# Patient Record
Sex: Female | Born: 1950 | Race: White | Hispanic: No | Marital: Single | Smoking: Former smoker
Health system: Southern US, Community
[De-identification: ages and names within clinical notes are randomized; demographics above are authoritative.]

## PROBLEM LIST (undated history)

## (undated) DIAGNOSIS — D649 Anemia, unspecified: Secondary | ICD-10-CM

## (undated) DIAGNOSIS — R7303 Prediabetes: Secondary | ICD-10-CM

## (undated) DIAGNOSIS — K5720 Diverticulitis of large intestine with perforation and abscess without bleeding: Secondary | ICD-10-CM

## (undated) DIAGNOSIS — R55 Syncope and collapse: Secondary | ICD-10-CM

## (undated) DIAGNOSIS — F32A Depression, unspecified: Secondary | ICD-10-CM

## (undated) DIAGNOSIS — J45909 Unspecified asthma, uncomplicated: Secondary | ICD-10-CM

## (undated) DIAGNOSIS — R0600 Dyspnea, unspecified: Secondary | ICD-10-CM

## (undated) DIAGNOSIS — E079 Disorder of thyroid, unspecified: Secondary | ICD-10-CM

## (undated) DIAGNOSIS — E785 Hyperlipidemia, unspecified: Secondary | ICD-10-CM

## (undated) DIAGNOSIS — R918 Other nonspecific abnormal finding of lung field: Secondary | ICD-10-CM

## (undated) DIAGNOSIS — R519 Headache, unspecified: Secondary | ICD-10-CM

## (undated) DIAGNOSIS — T7840XA Allergy, unspecified, initial encounter: Secondary | ICD-10-CM

## (undated) DIAGNOSIS — K589 Irritable bowel syndrome without diarrhea: Secondary | ICD-10-CM

## (undated) DIAGNOSIS — F419 Anxiety disorder, unspecified: Secondary | ICD-10-CM

## (undated) DIAGNOSIS — H269 Unspecified cataract: Secondary | ICD-10-CM

## (undated) DIAGNOSIS — J189 Pneumonia, unspecified organism: Secondary | ICD-10-CM

## (undated) DIAGNOSIS — K5790 Diverticulosis of intestine, part unspecified, without perforation or abscess without bleeding: Secondary | ICD-10-CM

## (undated) DIAGNOSIS — E039 Hypothyroidism, unspecified: Secondary | ICD-10-CM

## (undated) DIAGNOSIS — G43101 Migraine with aura, not intractable, with status migrainosus: Secondary | ICD-10-CM

## (undated) DIAGNOSIS — F329 Major depressive disorder, single episode, unspecified: Secondary | ICD-10-CM

## (undated) DIAGNOSIS — K5792 Diverticulitis of intestine, part unspecified, without perforation or abscess without bleeding: Secondary | ICD-10-CM

## (undated) DIAGNOSIS — K219 Gastro-esophageal reflux disease without esophagitis: Secondary | ICD-10-CM

## (undated) DIAGNOSIS — R51 Headache: Secondary | ICD-10-CM

## (undated) HISTORY — DX: Unspecified cataract: H26.9

## (undated) HISTORY — PX: HEMORRHOID SURGERY: SHX153

## (undated) HISTORY — DX: Irritable bowel syndrome, unspecified: K58.9

## (undated) HISTORY — DX: Disorder of thyroid, unspecified: E07.9

## (undated) HISTORY — DX: Depression, unspecified: F32.A

## (undated) HISTORY — DX: Major depressive disorder, single episode, unspecified: F32.9

## (undated) HISTORY — DX: Allergy, unspecified, initial encounter: T78.40XA

## (undated) HISTORY — PX: TUBAL LIGATION: SHX77

## (undated) HISTORY — DX: Gastro-esophageal reflux disease without esophagitis: K21.9

## (undated) HISTORY — DX: Unspecified asthma, uncomplicated: J45.909

## (undated) HISTORY — PX: COLONOSCOPY: SHX174

## (undated) HISTORY — DX: Anxiety disorder, unspecified: F41.9

## (undated) HISTORY — DX: Diverticulosis of intestine, part unspecified, without perforation or abscess without bleeding: K57.90

## (undated) HISTORY — PX: TUMOR REMOVAL: SHX12

## (undated) HISTORY — DX: Anemia, unspecified: D64.9

## (undated) HISTORY — DX: Hyperlipidemia, unspecified: E78.5

## (undated) HISTORY — PX: ABDOMINAL HYSTERECTOMY: SHX81

---

## 1997-12-16 ENCOUNTER — Emergency Department (HOSPITAL_COMMUNITY): Admission: EM | Admit: 1997-12-16 | Discharge: 1997-12-16 | Payer: Self-pay | Admitting: Emergency Medicine

## 1997-12-17 ENCOUNTER — Ambulatory Visit (HOSPITAL_COMMUNITY): Admission: RE | Admit: 1997-12-17 | Discharge: 1997-12-17 | Payer: Self-pay | Admitting: Emergency Medicine

## 1999-09-06 ENCOUNTER — Other Ambulatory Visit: Admission: RE | Admit: 1999-09-06 | Discharge: 1999-09-06 | Payer: Self-pay | Admitting: Family Medicine

## 2004-01-12 ENCOUNTER — Emergency Department (HOSPITAL_COMMUNITY): Admission: EM | Admit: 2004-01-12 | Discharge: 2004-01-12 | Payer: Self-pay | Admitting: Emergency Medicine

## 2005-11-07 ENCOUNTER — Ambulatory Visit (HOSPITAL_COMMUNITY): Admission: RE | Admit: 2005-11-07 | Discharge: 2005-11-07 | Payer: Self-pay | Admitting: Obstetrics & Gynecology

## 2008-04-05 ENCOUNTER — Emergency Department (HOSPITAL_BASED_OUTPATIENT_CLINIC_OR_DEPARTMENT_OTHER): Admission: EM | Admit: 2008-04-05 | Discharge: 2008-04-05 | Payer: Self-pay | Admitting: Emergency Medicine

## 2008-06-30 ENCOUNTER — Emergency Department (HOSPITAL_BASED_OUTPATIENT_CLINIC_OR_DEPARTMENT_OTHER): Admission: EM | Admit: 2008-06-30 | Discharge: 2008-06-30 | Payer: Self-pay | Admitting: Emergency Medicine

## 2008-07-14 ENCOUNTER — Ambulatory Visit: Payer: Self-pay | Admitting: Gastroenterology

## 2008-07-15 ENCOUNTER — Telehealth: Payer: Self-pay | Admitting: Gastroenterology

## 2008-07-16 ENCOUNTER — Ambulatory Visit: Payer: Self-pay | Admitting: Gastroenterology

## 2008-10-18 ENCOUNTER — Emergency Department (HOSPITAL_BASED_OUTPATIENT_CLINIC_OR_DEPARTMENT_OTHER): Admission: EM | Admit: 2008-10-18 | Discharge: 2008-10-18 | Payer: Self-pay | Admitting: Emergency Medicine

## 2008-10-18 ENCOUNTER — Ambulatory Visit: Payer: Self-pay | Admitting: Radiology

## 2008-10-20 ENCOUNTER — Ambulatory Visit: Payer: Self-pay | Admitting: Radiology

## 2008-10-20 ENCOUNTER — Ambulatory Visit (HOSPITAL_BASED_OUTPATIENT_CLINIC_OR_DEPARTMENT_OTHER): Admission: RE | Admit: 2008-10-20 | Discharge: 2008-10-20 | Payer: Self-pay | Admitting: *Deleted

## 2008-12-05 ENCOUNTER — Emergency Department (HOSPITAL_BASED_OUTPATIENT_CLINIC_OR_DEPARTMENT_OTHER): Admission: EM | Admit: 2008-12-05 | Discharge: 2008-12-05 | Payer: Self-pay | Admitting: Emergency Medicine

## 2008-12-05 ENCOUNTER — Ambulatory Visit: Payer: Self-pay | Admitting: Diagnostic Radiology

## 2009-09-28 ENCOUNTER — Emergency Department (HOSPITAL_COMMUNITY): Admission: EM | Admit: 2009-09-28 | Discharge: 2009-09-28 | Payer: Self-pay | Admitting: Emergency Medicine

## 2010-06-09 ENCOUNTER — Ambulatory Visit: Payer: Self-pay | Admitting: Family

## 2010-06-09 DIAGNOSIS — F341 Dysthymic disorder: Secondary | ICD-10-CM | POA: Insufficient documentation

## 2010-06-09 DIAGNOSIS — B354 Tinea corporis: Secondary | ICD-10-CM | POA: Insufficient documentation

## 2010-06-09 DIAGNOSIS — B351 Tinea unguium: Secondary | ICD-10-CM

## 2010-06-09 HISTORY — DX: Tinea unguium: B35.1

## 2010-06-09 HISTORY — DX: Dysthymic disorder: F34.1

## 2010-06-09 LAB — CONVERTED CEMR LAB
ALT: 20 units/L (ref 0–35)
AST: 25 units/L (ref 0–37)
Alkaline Phosphatase: 54 units/L (ref 39–117)
BUN: 14 mg/dL (ref 6–23)
Bilirubin, Direct: 0.1 mg/dL (ref 0.0–0.3)
Chloride: 102 meq/L (ref 96–112)
Glucose, Bld: 115 mg/dL — ABNORMAL HIGH (ref 70–99)
Potassium: 4.7 meq/L (ref 3.5–5.3)
Sodium: 140 meq/L (ref 135–145)
TSH: 2.528 microintl units/mL (ref 0.350–4.500)
Total Bilirubin: 0.4 mg/dL (ref 0.3–1.2)

## 2010-06-14 ENCOUNTER — Ambulatory Visit: Payer: Self-pay | Admitting: Family

## 2010-06-14 ENCOUNTER — Ambulatory Visit: Payer: Self-pay | Admitting: Interventional Radiology

## 2010-06-14 ENCOUNTER — Telehealth: Payer: Self-pay | Admitting: Family

## 2010-06-14 ENCOUNTER — Ambulatory Visit (HOSPITAL_BASED_OUTPATIENT_CLINIC_OR_DEPARTMENT_OTHER): Admission: RE | Admit: 2010-06-14 | Discharge: 2010-06-14 | Payer: Self-pay | Admitting: Internal Medicine

## 2010-06-16 ENCOUNTER — Emergency Department (HOSPITAL_BASED_OUTPATIENT_CLINIC_OR_DEPARTMENT_OTHER): Admission: EM | Admit: 2010-06-16 | Discharge: 2010-06-16 | Payer: Self-pay | Admitting: Emergency Medicine

## 2010-06-16 ENCOUNTER — Telehealth: Payer: Self-pay | Admitting: Internal Medicine

## 2010-07-13 ENCOUNTER — Ambulatory Visit (HOSPITAL_COMMUNITY): Admission: AD | Admit: 2010-07-13 | Discharge: 2010-07-14 | Payer: Self-pay | Admitting: Emergency Medicine

## 2010-07-13 ENCOUNTER — Encounter: Payer: Self-pay | Admitting: Family

## 2010-07-13 ENCOUNTER — Ambulatory Visit: Payer: Self-pay | Admitting: Cardiology

## 2010-07-13 ENCOUNTER — Encounter: Payer: Self-pay | Admitting: Emergency Medicine

## 2010-07-13 ENCOUNTER — Ambulatory Visit: Payer: Self-pay | Admitting: Family

## 2010-07-14 ENCOUNTER — Encounter: Payer: Self-pay | Admitting: Cardiovascular Disease

## 2010-07-18 ENCOUNTER — Ambulatory Visit: Payer: Self-pay | Admitting: Cardiovascular Disease

## 2010-07-18 ENCOUNTER — Encounter: Payer: Self-pay | Admitting: Cardiovascular Disease

## 2010-07-18 DIAGNOSIS — R072 Precordial pain: Secondary | ICD-10-CM | POA: Insufficient documentation

## 2010-07-19 ENCOUNTER — Telehealth: Payer: Self-pay | Admitting: Cardiovascular Disease

## 2010-07-20 ENCOUNTER — Telehealth: Payer: Self-pay | Admitting: Family

## 2010-07-21 ENCOUNTER — Ambulatory Visit: Payer: Self-pay | Admitting: Family

## 2010-07-24 ENCOUNTER — Ambulatory Visit: Payer: Self-pay | Admitting: Family

## 2010-07-24 LAB — CONVERTED CEMR LAB: Rapid Strep: NEGATIVE

## 2010-07-26 ENCOUNTER — Encounter (INDEPENDENT_AMBULATORY_CARE_PROVIDER_SITE_OTHER): Payer: Self-pay | Admitting: *Deleted

## 2010-08-10 ENCOUNTER — Ambulatory Visit (HOSPITAL_COMMUNITY)
Admission: RE | Admit: 2010-08-10 | Discharge: 2010-08-10 | Payer: Self-pay | Source: Home / Self Care | Attending: Cardiovascular Disease | Admitting: Cardiovascular Disease

## 2010-08-21 ENCOUNTER — Ambulatory Visit
Admission: RE | Admit: 2010-08-21 | Discharge: 2010-08-21 | Payer: Self-pay | Source: Home / Self Care | Attending: Cardiovascular Disease | Admitting: Cardiovascular Disease

## 2010-08-21 ENCOUNTER — Encounter: Payer: Self-pay | Admitting: Cardiovascular Disease

## 2010-08-25 ENCOUNTER — Telehealth: Payer: Self-pay | Admitting: Family

## 2010-09-03 ENCOUNTER — Encounter: Payer: Self-pay | Admitting: *Deleted

## 2010-09-08 ENCOUNTER — Ambulatory Visit
Admission: RE | Admit: 2010-09-08 | Discharge: 2010-09-08 | Payer: Self-pay | Source: Home / Self Care | Attending: Family | Admitting: Family

## 2010-09-08 DIAGNOSIS — H109 Unspecified conjunctivitis: Secondary | ICD-10-CM | POA: Insufficient documentation

## 2010-09-12 NOTE — Assessment & Plan Note (Signed)
Summary: CPX/HEA--Rm 6   Vital Signs:  Patient profile:   60 year old female Height:      65 inches Weight:      170 pounds BMI:     28.39 O2 Sat:      99 % on Room air Temp:     98.1 degrees F oral Pulse rate:   103 / minute Pulse rhythm:   regular Resp:     18 per minute BP sitting:   100 / 70  (right arm) Cuff size:   regular  Vitals Entered By: Mervin Kung CMA Duncan Dull) (June 14, 2010 8:55 AM)  O2 Flow:  Room air CC: Rm 6  Pt states she has had cough and sinus drainage since this weekend. Aches all over, fever at home. Needs note for school. Is Patient Diabetic? No Pain Assessment Patient in pain? yes     Location: body aches   Primary Care Provider:  Lemont Fillers FNP  CC:  Rm 6  Pt states she has had cough and sinus drainage since this weekend. Aches all over and fever at home. Needs note for school.Marland Kitchen  History of Present Illness: 60 year old female with cough, nasal congestion, sore throat, sinus drainage, bilateral ear pain.  This started on Friday.  Got worse over the weekend. Does not wish to complete CPX.  She is taking amoxicilln without any improvement.  Subjective fever last night.    Allergies: No Known Drug Allergies  Past History:  Past Medical History: Last updated: 07/14/2008 Anxiety Disorder Diverticulosis Hyperlipidemia Irritable Bowel Syndrome  Past Surgical History: Last updated: 07/14/2008 Hemorrhoidectomy Hysterectomy  Review of Systems       see HPI  Physical Exam  General:  sick appearing white female, awake,alert, and in NAD Head:  Normocephalic and atraumatic without obvious abnormalities. No apparent alopecia or balding. Neck:  No deformities, masses, or tenderness noted. Lungs:  Normal respiratory effort, chest expands symmetrically. Left lower lobe crackles noted. + coarse cough Heart:  Normal rate and regular rhythm. S1 and S2 normal without gallop, murmur, click, rub or other extra sounds.   Impression &  Recommendations:  Problem # 1:  BRONCHITIS (ICD-490) Assessment New Will switch from amoxicillin to doxycyline.  Pt is self pay for rx's.  CXR performed today is negative. Her updated medication list for this problem includes:    Doxycycline Hyclate 100 Mg Caps (Doxycycline hyclate) ..... One tablet by mouth two times a day x 10 days  Problem # 2:  SINUSITIS (ICD-473.9) Assessment: Unchanged Will treat with doxycycline as for #1. Her updated medication list for this problem includes:    Doxycycline Hyclate 100 Mg Caps (Doxycycline hyclate) ..... One tablet by mouth two times a day x 10 days  Problem # 3:  ONYCHOMYCOSIS, TOENAILS (ICD-110.1) Assessment: Unchanged LFT's normal, will start fluconazole once weekly. Her updated medication list for this problem includes:    Fluconazole 150 Mg Tabs (Fluconazole) ..... One tablet by mouth once weekly  Complete Medication List: 1)  Vitamin D 1300 Units  .... Take 1 capsule by mouth once a day. 2)  Vitamin B-12 100 Mcg Tabs (Cyanocobalamin) .... Take 2 tablets daily 3)  Women's Wellness  .... Take 1 capsule by mouth once a day. 4)  Johnathan Hausen Fiber  .Marland Kitchen.. 1 scoop in liquid daily. 5)  Memory Factor  .... Take 1 tablet by mouth once a day 6)  Prilosec Otc 20 Mg Tbec (Omeprazole magnesium) .... One tablet by mouth  daily 7)  Citalopram Hydrobromide 20 Mg Tabs (Citalopram hydrobromide) .... One half tablet by mouth daily for one week, then increase to full tablet on the second week as tolerated. 8)  Doxycycline Hyclate 100 Mg Caps (Doxycycline hyclate) .... One tablet by mouth two times a day x 10 days 9)  Fluconazole 150 Mg Tabs (Fluconazole) .... One tablet by mouth once weekly  Other Orders: CXR- 2view (CXR) Flu A+B (88416)  Patient Instructions: 1)  Please complete lab work in 1 month to check your liver.  2)  Call if your symptoms worsen or do not improve. Prescriptions: FLUCONAZOLE 150 MG TABS (FLUCONAZOLE) one tablet by mouth once  weekly  #4 x 2   Entered and Authorized by:   Lemont Fillers FNP   Signed by:   Lemont Fillers FNP on 06/14/2010   Method used:   Electronically to        [facility] # [phone]* (retail)       [address]       [place], [place]  [address]       Ph: [phone]       Fax: [phone]   RxID:   [id number] DOXYCYCLINE HYCLATE 100 MG CAPS (DOXYCYCLINE HYCLATE) one tablet by mouth two times a day x 10 days  #20 x 0   Entered and Authorized by:   Lemont Fillers FNP   Signed by:   Lemont Fillers FNP on 06/14/2010   Method used:   Electronically to        [facility] # [phone]* (retail)       [address]       [place], [place]  [address]       Ph: [phone]       Fax: [phone]   RxID:   [id number]    Orders Added: 1)  CXR- 2view [CXR] 2)  Flu A+B [87400] 3)  Est. Patient Level II [25852]    Laboratory Results   Date/Time Reported: Mervin Kung CMA Duncan Dull)  June 14, 2010 9:27 AM   Other Tests  Influenza A: negative Influenza B: negative    Appended Document: CPX/HEA--Rm 6 Exam  Ears- bilateral serous effusion without bulging or erythema neck- supple without cervical LAD

## 2010-09-12 NOTE — Progress Notes (Signed)
----   Converted from flag ---- ---- 06/14/2010 1:29 PM, Mervin Kung CMA (AAMA) wrote: Order placed and faxed.  ---- 06/14/2010 11:33 AM, Lemont Fillers FNP wrote: could we please send lab order for LFT's in 1 month.  Diagnosis is therapeutic drug monitoring. ------------------------------

## 2010-09-12 NOTE — Assessment & Plan Note (Signed)
Summary: cpx/mhf--Rm 5   Vital Signs:  Patient profile:   60 year old female Height:      65 inches Weight:      168.25 pounds BMI:     28.10 O2 Sat:      98 % on Room air Temp:     97.8 degrees F oral Pulse rate:   96 / minute Pulse rhythm:   regular Resp:     18 per minute BP sitting:   120 / 76  (right arm) Cuff size:   large  Vitals Entered By: Mervin Kung CMA Duncan Dull) (July 13, 2010 8:52 AM)  O2 Flow:  Room air CC: Pt here for physical. Is Patient Diabetic? No Comments Pt states she is not taking any medications. Pt has multiple concerns: 1. Still has cough 2. Pain across chest and in gums when she walks. 3. Pain in left shoulder 4. Bilateral rib pain x 1 week.   Primary Care Cydne Grahn:  Lemont Fillers FNP  CC:  Pt here for physical..  History of Present Illness: Ms. Shamoon pain across chest with walking.  Notes some lower gum pain.  Also notes some pain in the left upper arm.  + SOB and light headedness.  + pain beneath left breast, now below right breast.  Denies fever.  + cough.  Feels like "asthma." wheezing cough.  Generally dry, occasionally.    Did no finish prednisone due to "messing with my BP."  Mucinex seemed to help  Preventative-  Declines flu shot.  + exercise ab roller and "tommy Gazelle."   Preventive Screening-Counseling & Management  Alcohol-Tobacco     Alcohol drinks/day: <1     Alcohol type: wine     Smoking Status: quit     Year Quit: 1987     Pack years: 10 years  Caffeine-Diet-Exercise     Caffeine use/day: none     Does Patient Exercise: yes     Type of exercise: exercises in home     Times/week: 4  Allergies (verified): No Known Drug Allergies  Past History:  Past Medical History: Last updated: 07/14/2008 Anxiety Disorder Diverticulosis Hyperlipidemia Irritable Bowel Syndrome  Past Surgical History: Last updated: 07/14/2008 Hemorrhoidectomy Hysterectomy  Family History: Last updated: 06/09/2010 no colon  cancer Family History of Diabetes:  Family History of Heart Disease:  Family History of Irritable Bowel Syndrome: Family History of Kidney Disease: Mom- died at age 71 renal cell carcinoma, also had DM Dad-died at 33 from from MI, first MI in his 39's Brother died in his 58's from MI 5 sisters Jasmine December- diagnosed cervical cancer- living Carney Bern- ETOH- previous Sybil- weight loss, fibromyalgia, skin infections, OSA Doris- OA, cholecystecomy living [place]- healthy April born 1974- healthy, overweight Sandy-  1976 healthy Greig Castilla- 1983- healthy  Social History: Last updated: 07/13/2010 She is married- lives with husband and grandchild she has 3 children, school full time for paralegal  she rarely drinks alcohol  she does not drink much caffeinated beverages.   Her 14-year-old grandson whom she was primary caregiver passed away from leukemia summer 2009 Quit smoking 25 years ago  Family History: Reviewed history from 06/09/2010 and no changes required. no colon cancer Family History of Diabetes:  Family History of Heart Disease:  Family History of Irritable Bowel Syndrome: Family History of Kidney Disease: Mom- died at age 17 renal cell carcinoma, also had DM Dad-died at 44 from from MI, first MI in his 71's Brother died in his 42's from MI 5  sisters Jasmine December- diagnosed cervical cancer- living Carney Bern- ETOH- previous Sybil- weight loss, fibromyalgia, skin infections, OSA Doris- OA, cholecystecomy living [place]- healthy April born 1974- healthy, overweight Sandy-  1976 healthy Greig Castilla- 1983- healthy  Social History: Reviewed history from 06/09/2010 and no changes required. She is married- lives with husband and grandchild she has 3 children, school full time for paralegal  she rarely drinks alcohol  she does not drink much caffeinated beverages.   Her 50-year-old grandson whom she was primary caregiver passed away from leukemia summer 2009 Quit smoking 25 years ago  Review of  Systems       Constitutional: Denies Fever ENT:  + nasal congestion x 3 days  denies sore throat. Resp: + cough CV:  see HPI GI:  + nausea for several weeks, denies vomitting GU: Denies dysuria Lymphatic: Denies lymphadenopathy Musculoskeletal:  bilateral knee pain Skin:  Denies Rashes Psychiatric: easily worked up, tearful, student- stopped citalopram after 1 week.   Neuro: Tingling sensation in neck.     Physical Exam  General:  Well-developed,well-nourished,in no acute distress; alert,appropriate and cooperative throughout examination Head:  Normocephalic and atraumatic without obvious abnormalities. No apparent alopecia or balding. Eyes:  PERRLA, sclera are clear Ears:  External ear exam shows no significant lesions or deformities.  Otoscopic examination reveals clear canals, tympanic membranes are intact bilaterally without bulging, retraction, inflammation or discharge. Hearing is grossly normal bilaterally. Mouth:  Oral mucosa and oropharynx without lesions or exudates.  Teeth in good repair. Neck:  No deformities, masses, or tenderness noted. Chest Wall:  No reproducible chest wall tenderness Breasts:  No mass, nodules, thickening, tenderness, bulging, retraction, inflamation, nipple discharge or skin changes noted.   Lungs:  Normal respiratory effort, chest expands symmetrically. Lungs are clear to auscultation, no crackles or wheezes. Heart:  Normal rate and regular rhythm. S1 and S2 normal without gallop, murmur, click, rub or other extra sounds. Abdomen:  Bowel sounds positive,abdomen soft and non-tender without masses, organomegaly or hernias noted. Msk:  No deformity or scoliosis noted of thoracic or lumbar spine.   Extremities:  No clubbing, cyanosis, edema, or deformity noted with normal full range of motion of all joints.  No reproducible left shoulder pain with movement Neurologic:   Station and gait are normal. Plantar reflexes are down-going bilaterally. DTRs are  symmetrical throughout. Sensory, motor and coordinative functions appear intact. Skin:  sun tanned Cervical Nodes:  No lymphadenopathy noted Psych:  Cognition and judgment appear intact. Alert and cooperative with normal attention span and concentration. No apparent delusions, illusions, hallucinations   Impression & Recommendations:  Problem # 1:  CHEST PAIN (ICD-786.50) Assessment New 60 year old female with several week history of left arm pain, chest pain, SOB, nausea.  + family history of CAD (Dad and brother both had MI's in their 28's)  EKG without acute ischemic changes, however, I think that she should undergo a formal rule out evaluation.  Pt is agreeable to go downstairs to the ED for formal eval.  Report given to charge nurse.    Problem # 2:  Preventive Health Care (ICD-V70.0) Assessment: Comment Only Immunizations reviewed and up to date, declines flu shot.  Pap, mammo, Colo up to date.  Patient was counseled on weight loss. Advised patient to avoid tanning beds  Problem # 3:  ANXIETY DEPRESSION (ICD-300.4) Assessment: Comment Only declines medication at this time.  Complete Medication List: 1)  Vitamin D 1300 Units  .... Take 1 capsule by mouth once a day. 2)  Vitamin B-12 100 Mcg Tabs (Cyanocobalamin) .... Take 2 tablets daily 3)  Women's Wellness  .... Take 1 capsule by mouth once a day. 4)  Johnathan Hausen Fiber  .Marland Kitchen.. 1 scoop in liquid daily. 5)  Memory Factor  .... Take 1 tablet by mouth once a day 6)  Prilosec Otc 20 Mg Tbec (Omeprazole magnesium) .... One tablet by mouth daily 7)  Citalopram Hydrobromide 20 Mg Tabs (Citalopram hydrobromide) .... One half tablet by mouth daily for one week, then increase to full tablet on the second week as tolerated. 8)  Doxycycline Hyclate 100 Mg Caps (Doxycycline hyclate) .... One tablet by mouth two times a day x 10 days 9)  Fluconazole 150 Mg Tabs (Fluconazole) .... One tablet by mouth once weekly  Other Orders: EKG w/  Interpretation (93000)   Orders Added: 1)  EKG w/ Interpretation [93000] 2)  Est. Patient Level II [16109]      Current Allergies (reviewed today): No known allergies

## 2010-09-12 NOTE — Assessment & Plan Note (Signed)
Summary: Jamie [name]   Visit Type:  EPH Primary Provider:  Lemont Fillers FNP  CC:  chest pain and SOB.  History of Present Illness: 60 yo WF with h/o hyperlipidemia, IBS and anxiety who is here today for hospital follow up. She was admitted ot [facility] on 07/13/10 with chest pain and dyspnea. She ruled out for an Mi with serial enzymes. Her d-dimer was negative. Her echo was normal. EKG showed no ischemic changes. Stress myoview with no ischemia and normal EF. She did have chest pain with exericse. She tells me that she continues to have exertional SOB and bandlike chest tightness across the center of her chest. She tells me that she wishes to have further testing to exclude CAD.   Current Medications (verified): 1)  Vitamin B-12 100 Mcg Tabs (Cyanocobalamin) .... Take 2 Tablets Daily 2)  Women's Wellness .... Take 1 Capsule By Mouth Once A Day. 3)  Johnathan Hausen Fiber .Marland Kitchen.. 1 Scoop in Liquid Daily. 4)  Memory Factor .... Take 1 Tablet By Mouth Once A Day 5)  Fluconazole 150 Mg Tabs (Fluconazole) .... One Tablet By Mouth Once Weekly 6)  Aspirin 81 Mg Tbec (Aspirin) .... Pt Has Not Picked Up Yet 7)  Nitrostat 0.4 Mg Subl (Nitroglycerin) .Marland Kitchen.. 1 Tablet Under Tongue At Onset of Chest Pain; You May Repeat Every 5 Minutes For Up To 3 Doses.  Pt Has Not Picked Up Med Yet 8)  Vitamin D 1000 Unit Tabs (Cholecalciferol) .Marland Kitchen.. 1 Tab Once Daily 9)  Alprazolam 0.25 Mg Tabs (Alprazolam) .Marland Kitchen.. 1 Tab At Bedtime As Needed  Allergies (verified): No Known Drug Allergies  Past History:  Past Medical History: Reviewed history from 07/14/2008 and no changes required. Anxiety Disorder Diverticulosis Hyperlipidemia Irritable Bowel Syndrome  Past Surgical History: Hemorrhoidectomy Hysterectomy Bilateral tubal ligation  Family History: Reviewed history from 06/09/2010 and no changes required. no colon cancer Family History of Diabetes:  Family History of Heart Disease:    Family History of Irritable Bowel Syndrome: Family History of Kidney Disease: Mom- died at age 40 renal cell carcinoma, also had DM Dad-died at 29 from from MI, first MI in his 54's Brother died in his 24's from MI 5 sisters Jasmine December- diagnosed cervical cancer- living Carney Bern- ETOH- previous Sybil- weight loss, fibromyalgia, skin infections, OSA Doris- OA, cholecystecomy living [place]- healthy April born 1974- healthy, overweight Sandy-  1976 healthy Greig Castilla- 1983- healthy  Social History: Reviewed history from 07/13/2010 and no changes required. She is married- lives with husband and grandchild she has 3 children, school full time for paralegal  she rarely drinks alcohol  she does not drink much caffeinated beverages.   Her 37-year-old grandson whom she was primary caregiver passed away from leukemia summer 2009 Quit smoking 25 years ago  Review of Systems       The patient complains of chest pain and shortness of breath.  The patient denies fatigue, malaise, fever, weight gain/loss, vision loss, decreased hearing, hoarseness, palpitations, prolonged cough, wheezing, sleep apnea, coughing up blood, abdominal pain, blood in stool, nausea, vomiting, diarrhea, heartburn, incontinence, blood in urine, muscle weakness, joint pain, leg swelling, rash, skin lesions, headache, fainting, dizziness, depression, anxiety, enlarged lymph nodes, easy bruising or bleeding, and environmental allergies.    Vital Signs:  Patient profile:   60 year old female Height:      65 inches Weight:      169.50 pounds BMI:     28.31 Pulse rate:   100 /  minute Pulse rhythm:   irregular BP sitting:   122 / 68  (left arm) Cuff size:   large  Vitals Entered By: Danielle Rankin, CMA (July 18, 2010 4:00 PM)  Physical Exam  General:  General: Well developed, well nourished, NAD HEENT: OP clear, mucus membranes moist SKIN: warm, dry Neuro: No focal deficits Musculoskeletal: Muscle strength 5/5 all  ext Psychiatric: Mood and affect normal Neck: No JVD, no carotid bruits, no thyromegaly, no lymphadenopathy. Lungs:Clear bilaterally, no wheezes, rhonci, crackles CV: RRR no murmurs, gallops rubs Abdomen: soft, NT, ND, BS present Extremities: No edema, pulses 2+.    EKG  Procedure date:  07/18/2010  Findings:      Sinus rhythm, rate 100 bpm. Incomplete RBBB  Echocardiogram  Procedure date:  07/14/2010  Findings:       Left ventricle: The cavity size was normal. Wall thickness was   normal. Systolic function was normal. The estimated ejection   fraction was in the range of 55% to 60%.  Nuclear ETT  Procedure date:  07/14/2010  Findings:      1.  No evidence for exercise-induced myocardial reversibility.   2.  Normal left ventricular systolic function.   3.  Ejection fraction equals 73%.  Chest pain with exercise. EKG without ischemic changes.   Impression & Recommendations:  Problem # 1:  CHEST PAIN-PRECORDIAL (ICD-786.51) Atypical chest pain and dyspnea. Her stress test did not show ischemia. Her LV function is normal. She is convinced that the symptoms are related to her heart. I will order a coronary CTA to exclude coronary artery disease. The patient will need reassurance if her CTA is normal. I suspect her symptoms are non cardiac and most likely related to anxiety. Recent BMET. Will arrange coronary CTA and see her back after the test.   Her updated medication list for this problem includes:    Aspirin 81 Mg Tbec (Aspirin) .Marland Kitchen... Pt has not picked up yet    Nitrostat 0.4 Mg Subl (Nitroglycerin) .Marland Kitchen... 1 tablet under tongue at onset of chest pain; you may repeat every 5 minutes for up to 3 doses.  pt has not picked up med yet    Toprol Xl 50 Mg Xr24h-tab (Metoprolol succinate) .Marland Kitchen... Please take one tablet by mouth the night before your cardiac ct and one the morning of the procedure.  Other Orders: EKG w/ Interpretation (93000) Cardiac CTA (Cardiac CTA)  Patient  Instructions: 1)  Your physician recommends that you schedule a follow-up appointment in: 3-4 weeks. 2)  Your physician recommends that you continue on your current medications as directed. Please refer to the Current Medication list given to you today.  3)  Your physician has requested that you have a cardiac CT.  Cardiac computed tomography (CT) is a painless test that uses an x-ray machine to take clear, detailed pictures of your heart.  For further information please visit [email or web address]  Please follow instruction sheet as given.  4)  PLEASE take TOPROL 50mg  by mouth the night before the cardiac CTA and one tablet by mouth the morning of the procedure. Prescriptions: TOPROL XL 50 MG XR24H-TAB (METOPROLOL SUCCINATE) please take one tablet by mouth the night before your cardiac CT and one the morning of the procedure.  #3 x 0   Entered by:   Whitney Maeola Sarah RN   Authorized by:   Verne Carrow, MD   Signed by:   Ellender Hose RN on 07/18/2010   Method used:   Electronically to  [facility]  [address] # [phone]* (retail)       [phone]       [place], [place]  [address]       Ph: [phone]       Fax: [phone]   RxID:   [id number]

## 2010-09-12 NOTE — Miscellaneous (Signed)
Summary: lab order  Clinical Lists Changes  Orders: Added new Test order of T-Hepatic Function ([id number]) - Signed 

## 2010-09-12 NOTE — Progress Notes (Signed)
Summary: headache  Phone Note Other Incoming   Caller: patient Summary of Call: Pt stopped in office c/o of head pressure and tingling, blurry vision and weakness since yesterday. Reports that cough is 50% better. Advised pt and husband that she needed to be evaluated by the ER.  Pt voices understanding. Nicki Guadalajara Fergerson CMA Duncan Dull)  June 16, 2010 11:29 AM

## 2010-09-12 NOTE — Progress Notes (Signed)
Summary: re procedures tp be set up  Phone Note Call from Patient   Caller: Patient [phone] Reason for Call: Talk to Nurse Summary of Call: pt left yesterday before setting up procedures-said nurse was to call her today Initial call taken by: Glynda Jaeger,  July 19, 2010 11:10 AM  Follow-up for Phone Call        Spoke with patient. She is very upset b/c we can't schedule her coronary CTA until January. I explained to her that there are only two doctors in our office who read the test & we have to schedule according to when they are able to read them.  She is stating that it must be done before January b/c of her CP/SOB. I told her I will speak to our scheduler when she is back in the office to see if she has any availability in December.  Whitney Maeola Sarah RN  July 19, 2010 12:52 PM  Pt. scheduled for the end of December per Dolores Lory RN  July 21, 2010 4:34 PM   Follow-up by: Whitney Maeola Sarah RN,  July 19, 2010 12:52 PM

## 2010-09-12 NOTE — Progress Notes (Signed)
Summary: headache congestion ear ache   Phone Note Call from Patient   Caller: Patient Call For: Hildegarde Dunaway  Summary of Call: patient called saying she is congested, her ears and throat hurt and she wants a z pack.  I told her she would need to see Kindell Strada before she would prescribe any meds.  She made an appt for Friday at 1:30 but wanted me to let Scout Gumbs know  Initial call taken by: Roselle Locus,  July 20, 2010 1:57 PM  Follow-up for Phone Call        Pt notified to keep appt with [facility] and voices understanding. Nicki Guadalajara Fergerson CMA Duncan Dull)  July 20, 2010 2:06 PM

## 2010-09-12 NOTE — Assessment & Plan Note (Signed)
Summary: new to est/mhf--Rm 4   Vital Signs:  Patient profile:   60 year old female Height:      65 inches Weight:      170 pounds BMI:     28.39 Temp:     97.9 degrees F oral Pulse rate:   90 / minute Pulse rhythm:   regular Resp:     18 per minute BP sitting:   100 / 78  (right arm) Cuff size:   large  Vitals Entered By: Mervin Kung CMA [facility]) (June 09, 2010 11:27 AM) CC: Rm 4  New pt to establish care. Has multiple concerns today. Is Patient Diabetic? No Pain Assessment Patient in pain? no      Comments 1. Pt has bilateral ear pain radiating to jaw on left side x 4 days with sore throat. 2. Dizzy spells daily. 3. White patches under breasts, inner thighs and under her arms.     Visit Type:  Establish care Primary Care Provider:  Lemont Fillers FNP  CC:  Rm 4  New pt to establish care. Has multiple concerns today.Marland Kitchen  History of Present Illness: Jamie [name] is a 60 year old female who presents today to establish care.  She has multiple concerns today:  1. Bilateral ear pain for 4 days, dizzy spells x 1 month, + nasa cpmgestion, mild left maxilary sinus pressure.  + frontal dull headache  2. Toenail fungus- years right foot, foot also peels  3. White spots on her skin- between her breasts and on bilateral upper thighs  4. Recently noticed + Hives with peanut butter and fish (shrimp)   5. Anxiety and depression- lost her grandson to cancer 2 years ago, feels down a lot. (see ROS) Recently found out that her husband was looking at pornography on the computer which she finds very disturbing.    Patient has not had a primary care provider in years.  She is currently uninsured, but tells me that she has made arrangements for coverage through [employer].    Preventive Screening-Counseling & Management  Alcohol-Tobacco     Smoking Status: quit     Year Quit: 1987     Pack years: 10 years  Caffeine-Diet-Exercise     Caffeine use/day: none     Does  Patient Exercise: yes     Type of exercise: exercises in home     Times/week: 4  Allergies (verified): No Known Drug Allergies  Family History: no colon cancer Family History of Diabetes:  Family History of Heart Disease:  Family History of Irritable Bowel Syndrome: Family History of Kidney Disease: Mom- died at age 68 renal cell carcinoma, also had DM Dad-died at 61 from from MI, first MI in his 30's Brother died in his 35's from MI 5 sisters Jasmine December- diagnosed cervical cancer- living Carney Bern- ETOH- previous Sybil- weight loss, fibromyalgia, skin infections, OSA Doris- OA, cholecystecomy living [place]- healthy April born 1974- healthy, overweight Sandy-  1976 healthy Greig Castilla- 1983- healthy  Social History: She is married- lives with husband and grandchild she has 3 children, school full time for paralegal  she rarely drinks alcohol  she does not drink much caffeinated beverages.   Her 5-year-old grandson whom she was primary caregiver passed away from leukemia summer 2009Smoking Status:  quit Caffeine use/day:  none Does Patient Exercise:  yes  Review of Systems       Constitutional: Denies Fever ENT:  Denies nasal congestion +  sore throat (+ sick contact grand daughter)  Resp: Mild AM cough- has been coughing up green sputum for 2 months CV:  Denies Chest Pain some breathlessnes ? anxiety GI:  Chronic nause on empty stomach or vomitting or diarrhea, denies history of black colored stools or bloody stools GU: Denies dysuria- + polyuria, denies polydipsia Lymphatic: Denies lymphadenopathy  Musculoskeletal:  Denies muscle/joint pain Skin:  see HPI Psychiatric: Does not want to socialize, enjoys school.  Does not sleep that well.  Wakes up frequently.  Has several panic attacks a day.  Very easily tearful.   Neuro: Denies numbness GYN-  notes that she no longer has periods, but she continues to have hot flashes.   Physical Exam  General:  Well-developed,well-nourished,in  no acute distress; alert,appropriate and cooperative throughout examination Head:  Normocephalic and atraumatic without obvious abnormalities. No apparent alopecia or balding. Ears:  Tattoo on right Pinna.  R TM is dull without erythema or bulging.  L TM + COL Mouth:  Oral mucosa and oropharynx without lesions or exudates.  Teeth in good repair. Neck:  No deformities, masses, or tenderness noted. Lungs:  Normal respiratory effort, chest expands symmetrically. Lungs are clear to auscultation, no crackles or wheezes. Heart:  Normal rate and regular rhythm. S1 and S2 normal without gallop, murmur, click, rub or other extra sounds. Abdomen:  Bowel sounds positive,abdomen soft and non-tender without masses, organomegaly or hernias noted. Extremities:  Toenails are thickened on the right foot.  Some dry scaling skin is noted on the right sole.  Psych:  Oriented X3, memory intact for recent and remote, and normally interactive.  Patient became tearful at times during the visit.   Impression & Recommendations:  Problem # 1:  ANXIETY DEPRESSION (ICD-300.4) Assessment Deteriorated Will give patient a trial of citalopram which should hopefully help with anxiety, depression and her hot flashes.  Side effects were reviewed with pt as noted in patient sign out sheet.   Problem # 2:  SINUSITIS (ICD-473.9) Assessment: New Suspect + sinusitus as contributing factor to her complaint of HA, dizziness.  Will plan to treat with amoxicillin.   Her updated medication list for this problem includes:    Amoxicillin 500 Mg Cap (Amoxicillin) .Marland Kitchen... 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  Consider RAST testing next visit.  Problem # 4:  ONYCHOMYCOSIS, TOENAILS (ICD-110.1) consider terbinafine if LFT's normal  Problem # 5:  DERMATOPHYTOSIS OF THE BODY (ICD-110.5) Assessment:  New Plan terbinafine as for #4  Complete Medication List: 1)  Vitamin D 1300 Units  .... Take 1 capsule by mouth once a day. 2)  Vitamin B-12 100 Mcg Tabs (Cyanocobalamin) .... Take 2 tablets daily 3)  Women's Wellness  .... Take 1 capsule by mouth once a day. 4)  Johnathan Hausen Fiber  .Marland Kitchen.. 1 scoop in liquid daily. 5)  Memory Factor  .... Take 1 tablet by mouth once a day 6)  Prilosec Otc 20 Mg Tbec (Omeprazole magnesium) .... One tablet by mouth daily 7)  Citalopram Hydrobromide 20 Mg Tabs (Citalopram hydrobromide) .... One half tablet by mouth daily for one week, then increase to full tablet on the second week as tolerated. 8)  Amoxicillin 500 Mg Cap (Amoxicillin) .... Take 1 capsule by mouth three times a day x 10 days  Other Orders: TLB-BMP (Basic Metabolic Panel-BMET) (80048-METABOL) TLB-Hepatic/Liver Function Pnl (80076-HEPATIC) TLB-TSH (Thyroid Stimulating Hormone) (16109-UEA)  Patient  Instructions: 1)  Complete your lab work downstairs today. 2)  Please schedule a complete physical.  Come fasting to this appointment. 3)  Follow up in 1 month so that we can see how you are doing on your new medication. 4)  You will be contacted about your referral to see the therapist. 5)  Welcome to [facility]! Prescriptions: CITALOPRAM HYDROBROMIDE 20 MG TABS (CITALOPRAM HYDROBROMIDE) one half tablet by mouth daily for one week, then increase to full tablet on the second week as tolerated.  #30 x 1   Entered and Authorized by:   Lemont Fillers FNP   Signed by:   Lemont Fillers FNP on 06/09/2010   Method used:   Electronically to        [facility] # [phone]* (retail)       [address] [place], [place]  [address]       Ph: [phone]       Fax: [phone]   RxID:   [id number] AMOXICILLIN 500 MG CAP (AMOXICILLIN) Take 1 capsule by mouth three times a day X 10 days  #30 x 0   Entered and Authorized by:   Lemont Fillers FNP   Signed  by:   Lemont Fillers FNP on 06/09/2010   Method used:   Electronically to        [facility] # [phone]* (retail)       [address] [place], [place]  [address]       Ph: [phone]       Fax: [phone]   RxID:   [id number]    Orders Added: 1)  TLB-BMP (Basic Metabolic Panel-BMET) [80048-METABOL] 2)  TLB-Hepatic/Liver Function Pnl [80076-HEPATIC] 3)  TLB-TSH (Thyroid Stimulating Hormone) [84443-TSH] 4)  New Patient Level II [99202]    Contraindications/Deferment of Procedures/Staging:    Test/Procedure: FLU VAX    Reason for deferment: patient declined   Current Allergies (reviewed today): No known allergies    Preventive Care Screening  Pap Smear:    Date:  11/11/2009    Results:  normal   Mammogram:    Date:  09/13/2009    Results:  normal   Last Tetanus Booster:    Date:  08/13/2005    Results:  Historical      Appended Document: new to est/mhf--Rm 4 Pt preferred that rx's be sent to [facility] instead of [facility].

## 2010-09-12 NOTE — Letter (Signed)
Summary: Out of Work  Adult nurse at [employer]. [address]   [place], [place] [address]   Phone: [phone]  Fax: [phone]    June 14, 2010   Employee:  [employer]    To Whom It May Concern:   For Medical reasons, please excuse the above named student for work the following dates:  Start:   06/13/10  End:   06/18/10  She is currently contagious.  If you need additional information, please feel free to contact our office.         Sincerely,    Lemont Fillers FNP

## 2010-09-14 NOTE — Assessment & Plan Note (Signed)
Summary: laryngitis/mhf---Rm 4   Vital Signs:  Patient profile:   60 year old female Height:      65 inches Weight:      170 pounds BMI:     28.39 Temp:     97.6 degrees F oral Pulse rate:   78 / minute Pulse rhythm:   regular Resp:     18 per minute BP sitting:   110 / 80  (left arm) Cuff size:   large  Vitals Entered By: Mervin Kung CMA [facility]) (July 24, 2010 2:43 PM) CC: Pt states she has sore throat and laryngitis x 1 month. Still has pain in right and left shoulder area, seems to be worse with walking. Is Patient Diabetic? No Comments Pt states she never picked up Fluconazole rx b/c pharmacy said they didn't get it. I will call pharmacy. Nicki Guadalajara Fergerson CMA [facility])  July 24, 2010 2:52 PM    Primary Care Provider:  Lemont Fillers FNP  CC:  Pt states she has sore throat and laryngitis x 1 month. Still has pain in right and left shoulder area and seems to be worse with walking.Marland Kitchen  History of Present Illness: Jamie [name] is a 60 year old female who presents today with chief complaint of cough. She reports cough has been present for greater than one month. She describes symptoms as moderate. Cough is associated with sore throat, hoarseness, and soreness across her chest, and burning in her chest. Cough is worse in the morning and in the evenings. Cough is not improved with use of Robitussin or cough drops. She noted that she had some brief improvement in her cough in early November after use of doxycycline, but it did not "to get phlegm up."  The patient was recently admitted to [facility] for atypical chest pain. She underwent serial cardiac enzymes which were negative. Since that time she has followed up in the lower cardiology office and has had a normal stress test. She tells me that Dr. Fredricka Bonine is planning to do a cardiac CT.  He has placed her on a beta blocker.  Allergies (verified): No Known Drug Allergies  Past History:  Past  Medical History: Last updated: 07/14/2008 Anxiety Disorder Diverticulosis Hyperlipidemia Irritable Bowel Syndrome  Past Surgical History: Last updated: 07/18/2010 Hemorrhoidectomy Hysterectomy Bilateral tubal ligation  Family History: Reviewed history from 06/09/2010 and no changes required. no colon cancer Family History of Diabetes:  Family History of Heart Disease:  Family History of Irritable Bowel Syndrome: Family History of Kidney Disease: Mom- died at age 46 renal cell carcinoma, also had DM Dad-died at 45 from from MI, first MI in his 23's Brother died in his 53's from MI 5 sisters Jasmine December- diagnosed cervical cancer- living Carney Bern- ETOH- previous Sybil- weight loss, fibromyalgia, skin infections, OSA Doris- OA, cholecystecomy living [place]- healthy April born 1974- healthy, overweight Sandy-  1976 healthy Greig Castilla- 1983- healthy  Social History: Reviewed history from 07/13/2010 and no changes required. She is married- lives with husband and grandchild she has 3 children, school full time for paralegal  she rarely drinks alcohol  she does not drink much caffeinated beverages.   Her 58-year-old grandson whom she was primary caregiver passed away from leukemia summer 2009 Quit smoking 25 years ago  Review of Systems       see HPI  Physical Exam  General:  Well-developed,well-nourished,in no acute distress; alert,appropriate and cooperative throughout examination.  Voice is hoarse. Head:  Normocephalic and atraumatic without obvious  abnormalities. No apparent alopecia or balding. Eyes:  PERRLA, sclera are clear Ears:  External ear exam shows no significant lesions or deformities.  Otoscopic examination reveals clear canals, tympanic membranes are intact bilaterally without bulging, retraction, inflammation or discharge. Hearing is grossly normal bilaterally. Mouth:  Oral mucosa and oropharynx without lesions or exudates.  Teeth in good repair. Neck:  No deformities,  masses, or tenderness noted. Chest Wall:  + anterior chest wall tenderness Lungs:  Normal respiratory effort, chest expands symmetrically. Lungs are clear to auscultation, no crackles or wheezes. Heart:  Normal rate and regular rhythm. S1 and S2 normal without gallop, murmur, click, rub or other extra sounds. Extremities:  No clubbing, cyanosis, edema, or deformity noted with normal full range of motion of all joints.   Psych:  Cognition and judgment appear intact. Alert and cooperative with normal attention span and concentration. No apparent delusions, illusions, hallucinations   Impression & Recommendations:  Problem # 1:  COUGH (ICD-786.2) Assessment Deteriorated Lungs are clear, pt is afebrile, + voice hoarseness, rapid strep negative.  No significant improvement with previous rounds of amoxicillin or doxycycline.  Pt refused spirometry and any meds containing albuterol.  "Makes my heart crazy."  Doubt bacterial infection.  CXR 11/2 and 12/1 were both normal.  Hoarseness and cough maybe due to underlying reflux symptoms. Patient was instructed to start over-the-counter omeprazole 20 mg p.o. daily. She was also instructed to start fluticasone 44 micrograms 2 puffs twice daily. Prescription has been provided.  Complete Medication List: 1)  Vitamin B-12 100 Mcg Tabs (Cyanocobalamin) .... Take 2 tablets daily 2)  Women's Wellness  .... Take 1 capsule by mouth once a day. 3)  Johnathan Hausen Fiber  .Marland Kitchen.. 1 scoop in liquid daily. 4)  Memory Factor  .... Take 1 tablet by mouth once a day 5)  Fluconazole 150 Mg Tabs (Fluconazole) .... One tablet by mouth once weekly 6)  Aspirin 81 Mg Tbec (Aspirin) .... Pt has not picked up yet 7)  Nitrostat 0.4 Mg Subl (Nitroglycerin) .Marland Kitchen.. 1 tablet under tongue at onset of chest pain; you may repeat every 5 minutes for up to 3 doses.  pt has not picked up med yet 8)  Vitamin D 1000 Unit Tabs (Cholecalciferol) .Marland Kitchen.. 1 tab once daily 9)  Alprazolam 0.25 Mg Tabs  (Alprazolam) .Marland Kitchen.. 1 tab at bedtime as needed 10)  Toprol Xl 50 Mg Xr24h-tab (Metoprolol succinate) .... Please take one tablet by mouth the night before your cardiac ct and one the morning of the procedure. 11)  Mucinex Dm 30-600 Mg Xr12h-tab (Dextromethorphan-guaifenesin) .... One tablet twice daily as needed for cough/congestion 12)  Omeprazole 20 Mg Cpdr (Omeprazole) .... One tab by mouth daily 13)  Flovent Hfa 44 Mcg/act Aero (Fluticasone propionate  hfa) .... 2 inhalations twice daily  Other Orders: Rapid Strep (57846)  Patient Instructions: 1)  Call if you develop weakness, fever, if symptoms worsen, or if they are not improved in 72 hours. 2)  Follow up in 2 weeks. Prescriptions: FLOVENT HFA 44 MCG/ACT AERO (FLUTICASONE PROPIONATE  HFA) 2 inhalations twice daily  #1 x 0   Entered and Authorized by:   Lemont Fillers FNP   Signed by:   Lemont Fillers FNP on 07/24/2010   Method used:   Samples Given   RxID:   [id number]    Orders Added: 1)  Rapid Strep [27253] 2)  Est. Patient Level II [66440]     Current Allergies (reviewed today): No known allergies  Laboratory Results   Date/Time Reported: Mervin Kung CMA [facility])  July 24, 2010 3:27 PM   Other Tests  Rapid Strep: negative  Kit Test Internal QC: Positive   (Normal Range: Negative)

## 2010-09-14 NOTE — Assessment & Plan Note (Signed)
Summary: f/u CTA 08/10/10/SL   Visit Type:  rov Primary Provider:  Lemont Fillers FNP  CC:  pt c/o arm weakness w/any activity and today she c/o left arm pain...sob w/walking...chest pressure at times......  History of Present Illness: 60 yo WF with h/o hyperlipidemia, IBS and anxiety who is here today for follow up. She was admitted to [facility] on 07/13/10 with chest pain and dyspnea. She ruled out for an Mi with serial enzymes. Her d-dimer was negative. Her echo was normal. EKG showed no ischemic changes. Stress myoview with no ischemia and normal EF. I saw her in hospital follow up and she continued to complain of exertional SOB and bandlike chest tightness across the center of her chest. She insisted upon further testing so we ordered a  coronary CTA. This was performed on 08/01/10 and showed no evidence of CAD and a calcium score of zero.  She continues to have c/o bilateral arm weakness, neck pain and shoulder pain.    Current Medications (verified): 1)  Vitamin B-12 100 Mcg Tabs (Cyanocobalamin) .... Take 2 Tablets Daily 2)  Women's Wellness .... Take 1 Capsule By Mouth Once A Day. 3)  Johnathan Hausen Fiber .Marland Kitchen.. 1 Scoop in Liquid Daily. 4)  Memory Factor .... Take 1 Tablet By Mouth Once A Day 5)  Fluconazole 150 Mg Tabs (Fluconazole) .... One Tablet By Mouth Once Weekly 6)  Aspirin 81 Mg Tbec (Aspirin) .... Take One Tablet By Mouth Daily.Marland Kitchenlast Pt :    Has Not Started This Yet 7)  Nitrostat 0.4 Mg Subl (Nitroglycerin) .Marland Kitchen.. 1 Tablet Under Tongue At Onset of Chest Pain; You May Repeat Every 5 Minutes For Up To 3 Doses.  Pt Has Not Picked Up Med Yet 8)  Vitamin D 1000 Unit Tabs (Cholecalciferol) .Marland Kitchen.. 1 Tab Once Daily 9)  Alprazolam 0.25 Mg Tabs (Alprazolam) .Marland Kitchen.. 1 Tab At Bedtime As Needed 10)  Mucinex Dm 30-600 Mg Xr12h-Tab (Dextromethorphan-Guaifenesin) .... One Tablet Twice Daily As Needed For Cough/congestion 11)  Flovent Hfa 44 Mcg/act Aero (Fluticasone Propionate  Hfa)  .... 2 Inhalations Twice Daily  Allergies (verified): No Known Drug Allergies  Past History:  Past Medical History: Reviewed history from 07/14/2008 and no changes required. Anxiety Disorder Diverticulosis Hyperlipidemia Irritable Bowel Syndrome  Social History: Reviewed history from 07/13/2010 and no changes required. She is married- lives with husband and grandchild she has 3 children, school full time for paralegal  she rarely drinks alcohol  she does not drink much caffeinated beverages.   Her 63-year-old grandson whom she was primary caregiver passed away from leukemia summer 2009 Quit smoking 25 years ago  Review of Systems       The patient complains of muscle weakness and joint pain.  The patient denies fatigue, malaise, fever, weight gain/loss, vision loss, decreased hearing, hoarseness, chest pain, palpitations, shortness of breath, prolonged cough, wheezing, sleep apnea, coughing up blood, abdominal pain, blood in stool, nausea, vomiting, diarrhea, heartburn, incontinence, blood in urine, leg swelling, rash, skin lesions, headache, fainting, dizziness, depression, anxiety, enlarged lymph nodes, easy bruising or bleeding, and environmental allergies.         See HPI.   Vital Signs:  Patient profile:   60 year old female Height:      65 inches Weight:      171 pounds BMI:     28.56 Pulse rate:   91 / minute Pulse rhythm:   regular BP sitting:   116 / 68  (right arm) Cuff  size:   large  Vitals Entered By: Danielle Rankin, CMA (August 21, 2010 4:00 PM)  Physical Exam  General:  General: Well developed, well nourished, NAD HEENT: OP clear, mucus membranes moist SKIN: warm, dry Neuro: No focal deficits Psychiatric: Mood and affect normal Neck: No JVD, no carotid bruits, no thyromegaly, no lymphadenopathy. Lungs:Clear bilaterally, no wheezes, rhonci, crackles CV: RRR no murmurs, gallops rubs Abdomen: soft, NT, ND, BS present Extremities: No edema, pulses  2+.    CT Scan  Procedure date:  08/10/2010  Findings:      Coronary Arteries:  Right dominant. No anomalies.  LM short segment and small caliber normal. LAD normal, Very small IM normal D1 large and branching normal Circumflex normal, OM1, OM2 normal.  RCA normal   Impression:   1)    Calcium Score 0   2)    Normal right dominant coronary arteries  EKG  Procedure date:  08/21/2010  Findings:      NSR, rate 91 bpm.   Impression & Recommendations:  Problem # 1:  CHEST PAIN-PRECORDIAL (ICD-786.51) Non-cardiac pain. Coronary CT without evidence of CAD. Stress test negative. I believe that her anxiety is driving many of her symptoms. She may be considered for an MRI of the cervical spine to exclude nerve compression as cause of her symptoms.   The following medications were removed from the medication list:    Toprol Xl 50 Mg Xr24h-tab (Metoprolol succinate) .Marland Kitchen... Please take one tablet by mouth the night before your cardiac ct and one the morning of the procedure. Her updated medication list for this problem includes:    Aspirin 81 Mg Tbec (Aspirin) .Marland Kitchen... Take one tablet by mouth daily.Marland Kitchenlast pt :    has not started this yet    Nitrostat 0.4 Mg Subl (Nitroglycerin) .Marland Kitchen... 1 tablet under tongue at onset of chest pain; you may repeat every 5 minutes for up to 3 doses.  pt has not picked up med yet  Patient Instructions: 1)  Your physician recommends that you schedule a follow-up appointment in: as needed.  Prescriptions: NITROSTAT 0.4 MG SUBL (NITROGLYCERIN) 1 tablet under tongue at onset of chest pain; you may repeat every 5 minutes for up to 3 doses.  PT HAS NOT PICKED UP MED YET  #25 x 6   Entered by:   Danielle Rankin, CMA   Authorized by:   Verne Carrow, MD   Signed by:   Danielle Rankin, CMA on 08/21/2010   Method used:   Electronically to        [facility] # [phone]* (retail)       [address]       [place], [place]  [address]       Ph:  [phone]       Fax: [phone]   RxID:   [id number]

## 2010-09-14 NOTE — Progress Notes (Signed)
Summary: Nail Fungus  Phone Note Call from Patient Call back at Home Phone [phone]   Caller: Patient Call For: Lemont Fillers FNP Summary of Call: patients called and left voice message requesting refill on diflucan for a nail fungus. Her message states the rx has worked well and she will need a rx. If approved she is requesting the rx to [facility] on file. Initial call taken by: Glendell Docker CMA,  August 25, 2010 1:13 PM  Follow-up for Phone Call        patient called again and states that her rx was to be for 3 months  and [facility] only filled for one month.  She wonders why [facility] not aware this was supposed to be for 3 months.  Advised patient Efraim Kaufmann would not be back till Monday but she feels she needs the med before then.  Roselle Locus  August 25, 2010 1:28 PM  Additional Follow-up for Phone Call Additional follow up Details #1::        original rx was  for 3 months. please call [facility] re: why medication was not refilled Additional Follow-up by: D. Thomos Lemons DO,  August 25, 2010 5:25 PM    Additional Follow-up for Phone Call Additional follow up Details #2::    Rx has been sent.  Pt needs to return for LFTs please.  Needs a follow up appointment this month please for re-evaluation. Follow-up by: Lemont Fillers FNP,  August 28, 2010 9:35 AM  Additional Follow-up for Phone Call Additional follow up Details #3:: Details for Additional Follow-up Action Taken: call placed to patient at [phone], no answer. A detailed voice message was left informing patient per [facility] instructions. Message was left for patient  to call back to schedule follow up and arrange for blood work. Glendell Docker CMA  August 28, 2010 9:40 AM   Prescriptions: FLUCONAZOLE 150 MG TABS (FLUCONAZOLE) one tablet by mouth once weekly  #4 x 0   Entered and Authorized by:   Lemont Fillers FNP   Signed by:   Lemont Fillers FNP on 08/28/2010   Method used:    Electronically to        [facility] # [phone]* (retail)       [address] [place], [place]  [address]       Ph: [phone]       Fax: [phone]   RxID:   [id number]

## 2010-09-14 NOTE — Letter (Signed)
Summary: Generic Letter  Architectural technologist, Main Office  [address]   [place], [place] [address]   Phone: [phone]  Fax: [phone]        July 26, 2010 MRN: [id number]    Jamie [name] [address] [place], [place]  [address]    Dear Jamie [name],   You are scheduled for Cardiac CT on : 08/10/10 at 1pm.  DO NOT EAT OR DRINK ANYTHING AFTER 9AM, THE MORNING OF YOUR PROCEDURE.  PLEASE ARRIVE AT THE [place] OUT-PATIENT ADMISSION OFFICE LOCATED ON THE FIRST FLOOR NEAR THE GIFT SHOP, 1 HOUR PRIOR TO APPOINTMENT TIME.   Sincerely,  Merita Norton Lloyd-Fate

## 2010-09-20 NOTE — Assessment & Plan Note (Signed)
Summary: pink eye?/ ss--rm 5   Vital Signs:  Patient profile:   60 year old female Height:      65 inches Weight:      169 pounds BMI:     28.22 Temp:     98.1 degrees F oral Pulse rate:   72 / minute Pulse rhythm:   regular Resp:     16 per minute BP sitting:   110 / 80  (right arm) Cuff size:   large  Vitals Entered By: Mervin Kung CMA Duncan Dull) (September 08, 2010 8:02 AM) CC: Pt has had left eye burning, redness and itching x 2 days. Is Patient Diabetic? No Comments Pt has completed Fluconazole, Mucinex and Flovent. Pt has picked up Nitrostat and ASA but has not taken them yet. Pt concerned about possible side effects from ASA. Jamie [name] CMA Duncan Dull)  September 08, 2010 8:10 AM    Primary Care Provider:  Lemont Fillers FNP  CC:  Pt has had left eye burning and redness and itching x 2 days.Marland Kitchen  History of Present Illness: Jamie [name] is a 60 year old female who presents today with chief complaint of left eye burning.  Recently had her house tested for mold and found out that it was + for high levels of mold.  Eye burning, red, crusting.  Some blurring.    Patient notes 2 week history of cough and chest congestion.  Now coughing green phlegm.  Denies fever.    Allergies (verified): No Known Drug Allergies  Past History:  Past Medical History: Last updated: 07/14/2008 Anxiety Disorder Diverticulosis Hyperlipidemia Irritable Bowel Syndrome  Past Surgical History: Last updated: 07/18/2010 Hemorrhoidectomy Hysterectomy Bilateral tubal ligation  Review of Systems       see HPI  Physical Exam  General:  Well-developed,well-nourished,in no acute distress; alert,appropriate and cooperative throughout examination Head:  Normocephalic and atraumatic without obvious abnormalities. No apparent alopecia or balding. Eyes:  slight erythema noted left sclera without any crusting or drainage Lungs:  Normal respiratory effort, chest expands symmetrically. Lungs are  clear to auscultation, no crackles or wheezes. Heart:  Normal rate and regular rhythm. S1 and S2 normal without gallop, murmur, click, rub or other extra sounds.   Impression & Recommendations:  Problem # 1:  CONJUNCTIVITIS, LEFT (ICD-372.30) Assessment New Will treat with erythromycin ointment.  Problem # 2:  BRONCHITIS (ICD-490) Assessment: Deteriorated Will treat with doxycycline.  Suspect that mold in her home is exacerbating her symptoms.  Also recommended that she take zyrtec once a day. Her updated medication list for this problem includes:    Mucinex Dm 30-600 Mg Xr12h-tab (Dextromethorphan-guaifenesin) ..... One tablet twice daily as needed for cough/congestion    Flovent Hfa 44 Mcg/act Aero (Fluticasone propionate  hfa) .Marland Kitchen... 2 inhalations twice daily    Doxycycline Hyclate 100 Mg Caps (Doxycycline hyclate) ..... One cap by mouth two times a day for 7 days  Complete Medication List: 1)  Vitamin B-12 100 Mcg Tabs (Cyanocobalamin) .... Take 2 tablets daily 2)  Women's Wellness  .... Take 1 capsule by mouth once a day. 3)  Johnathan Hausen Fiber  .Marland Kitchen.. 1 scoop in liquid daily. 4)  Memory Factor  .... Take 1 tablet by mouth once a day 5)  Fluconazole 150 Mg Tabs (Fluconazole) .... One tablet by mouth once weekly 6)  Aspirin 81 Mg Tbec (Aspirin) .... Take one tablet by mouth daily.Marland Kitchenlast pt :    has not started this yet 7)  Nitrostat 0.4 Mg  Subl (Nitroglycerin) .Marland Kitchen.. 1 tablet under tongue at onset of chest pain; you may repeat every 5 minutes for up to 3 doses.  pt has not picked up med yet 8)  Vitamin D 1000 Unit Tabs (Cholecalciferol) .Marland Kitchen.. 1 tab once daily 9)  Alprazolam 0.25 Mg Tabs (Alprazolam) .Marland Kitchen.. 1 tab at bedtime as needed 10)  Mucinex Dm 30-600 Mg Xr12h-tab (Dextromethorphan-guaifenesin) .... One tablet twice daily as needed for cough/congestion 11)  Flovent Hfa 44 Mcg/act Aero (Fluticasone propionate  hfa) .... 2 inhalations twice daily 12)  Claritin 10 Mg Caps (Loratadine)  .... One cap by mouth daily 13)  Erythromycin 5 Mg/gm Oint (erythromycin) Opthalmic  .... Apply 1 cm ribbon to each eye 4 times day for 1 week 14)  Doxycycline Hyclate 100 Mg Caps (Doxycycline hyclate) .... One cap by mouth two times a day for 7 days  Patient Instructions: 1)  Please call if symptoms worsen, or if they are note improved in 48 to 72 hours. Prescriptions: DOXYCYCLINE HYCLATE 100 MG CAPS (DOXYCYCLINE HYCLATE) one cap by mouth two times a day for 7 days  #14 x 0   Entered and Authorized by:   Lemont Fillers FNP   Signed by:   Lemont Fillers FNP on 09/08/2010   Method used:   Faxed to ...       [facility]  [address] # [phone]* (retail)       [phone]       [place], [place]  [address]       Ph: [phone]       Fax: [phone]   RxID:   [id number] ERYTHROMYCIN 5 MG/GM OINT (ERYTHROMYCIN) OPTHALMIC apply 1 cm ribbon to each eye 4 times day for 1 week  #1 x 0   Entered and Authorized by:   Lemont Fillers FNP   Signed by:   Lemont Fillers FNP on 09/08/2010   Method used:   Faxed to ...       [facility]  [address] # [phone]* (retail)       [phone]       [place], [place]  [address]       Ph: [phone]       Fax: [phone]   RxID:   [id number]  Call from pt that pharmacy doesn't have rxs. Called rxs to [place] at [place]. Jamie [name] CMA Duncan Dull)  September 08, 2010 9:18 AM   Orders Added: 1)  Est. Patient Level II [[address]]    Current Allergies (reviewed today): No known allergies

## 2010-10-05 ENCOUNTER — Telehealth: Payer: Self-pay | Admitting: Family

## 2010-10-06 ENCOUNTER — Ambulatory Visit: Payer: Self-pay | Admitting: Family

## 2010-10-10 NOTE — Progress Notes (Signed)
Summary: bloodwork for diflucan  Phone Note Call from Patient Call back at Home Phone [phone]   Caller: Patient Call For: Lemont Fillers FNP Summary of Call: pt called stating that she needs to be scheduled for bloodwork before she can have more refills for diflucan?? please assist. Initial call taken by: Elba Barman,  October 05, 2010 9:49 AM  Follow-up for Phone Call        Left message on machine to return my call.  Pt needs LFTs and f/u with Fara Worthy. Nicki Guadalajara Fergerson CMA Duncan Dull)  October 05, 2010 9:59 AM   Additional Follow-up for Phone Call Additional follow up Details #1::        pt returned phone call about bloodwork but wanted to make appt for f/u also. I got her in tomorrow afternoon and also her husband. She said that she thinks her husband Barbara Cower [phone] needs bloodwork also. Additional Follow-up by: Elba Barman,  October 05, 2010 11:56 AM

## 2010-10-24 LAB — CBC
HCT: 40.3 % (ref 36.0–46.0)
Hemoglobin: 14.2 g/dL (ref 12.0–15.0)
MCH: 31.3 pg (ref 26.0–34.0)
MCV: 89 fL (ref 78.0–100.0)
RBC: 4.53 MIL/uL (ref 3.87–5.11)
WBC: 5.7 10*3/uL (ref 4.0–10.5)

## 2010-10-24 LAB — BASIC METABOLIC PANEL
CO2: 28 mEq/L (ref 19–32)
Chloride: 103 mEq/L (ref 96–112)
GFR calc Af Amer: >60 mL/min (ref >60)
Potassium: 4.2 mEq/L (ref 3.5–5.1)

## 2010-10-24 LAB — URINALYSIS, ROUTINE W REFLEX MICROSCOPIC
Glucose, UA: NEGATIVE mg/dL
Hgb urine dipstick: NEGATIVE
Protein, ur: NEGATIVE mg/dL
Specific Gravity, Urine: 1.007 (ref 1.005–1.030)

## 2010-10-24 LAB — DIFFERENTIAL
Eosinophils Absolute: 0.1 10*3/uL (ref 0.0–0.7)
Eosinophils Relative: 2 % (ref 0–5)
Lymphocytes Relative: 20 % (ref 12–46)
Lymphs Abs: 1.2 10*3/uL (ref 0.7–4.0)
Monocytes Absolute: 0.5 10*3/uL (ref 0.1–1.0)
Monocytes Relative: 9 % (ref 3–12)

## 2010-10-24 LAB — POCT TOXICOLOGY PANEL

## 2010-10-24 LAB — POCT CARDIAC MARKERS
CKMB, poc: <1 ng/mL — ABNORMAL LOW (ref 1.0–8.0)
Myoglobin, poc: 61.2 ng/mL (ref 12–200)

## 2010-10-24 LAB — URINE MICROSCOPIC-ADD ON

## 2010-10-24 LAB — LIPID PANEL
LDL Cholesterol: 169 mg/dL — ABNORMAL HIGH (ref 0–99)
Total CHOL/HDL Ratio: 5.3 RATIO
Triglycerides: 221 mg/dL — ABNORMAL HIGH (ref <150)
VLDL: 44 mg/dL — ABNORMAL HIGH (ref 0–40)

## 2010-10-24 LAB — CARDIAC PANEL(CRET KIN+CKTOT+MB+TROPI)
CK, MB: 0.6 ng/mL (ref 0.3–4.0)
Relative Index: INVALID (ref 0.0–2.5)
Total CK: 57 U/L (ref 7–177)
Troponin I: 0.01 ng/mL (ref 0.00–0.06)

## 2010-10-24 LAB — D-DIMER, QUANTITATIVE: D-Dimer, Quant: <0.22 ug/mL-FEU (ref 0.00–0.48)

## 2010-11-02 LAB — DIFFERENTIAL
Basophils Absolute: 0 10*3/uL (ref 0.0–0.1)
Eosinophils Relative: 0 % (ref 0–5)
Lymphocytes Relative: 17 % (ref 12–46)
Neutro Abs: 3.6 10*3/uL (ref 1.7–7.7)
Neutrophils Relative %: 75 % (ref 43–77)

## 2010-11-02 LAB — POCT CARDIAC MARKERS: Troponin i, poc: <0.05 ng/mL (ref 0.00–0.09)

## 2010-11-02 LAB — POCT I-STAT, CHEM 8
Calcium, Ion: 1.22 mmol/L (ref 1.12–1.32)
Chloride: 108 mEq/L (ref 96–112)
HCT: 39 % (ref 36.0–46.0)
Potassium: 4.4 mEq/L (ref 3.5–5.1)
Sodium: 140 mEq/L (ref 135–145)

## 2010-11-02 LAB — CBC
Platelets: 284 10*3/uL (ref 150–400)
RDW: 12.5 % (ref 11.5–15.5)

## 2010-11-02 LAB — D-DIMER, QUANTITATIVE: D-Dimer, Quant: 0.24 ug/mL-FEU (ref 0.00–0.48)

## 2010-11-14 ENCOUNTER — Other Ambulatory Visit: Payer: Self-pay | Admitting: *Deleted

## 2010-11-14 DIAGNOSIS — B351 Tinea unguium: Secondary | ICD-10-CM

## 2010-11-15 ENCOUNTER — Other Ambulatory Visit: Payer: Self-pay | Admitting: Family

## 2010-11-16 LAB — HEPATIC FUNCTION PANEL
Albumin: 4.5 g/dL (ref 3.5–5.2)
Alkaline Phosphatase: 54 U/L (ref 39–117)
Total Protein: 7.1 g/dL (ref 6.0–8.3)

## 2010-11-16 NOTE — Telephone Encounter (Signed)
Spoke to pt, she is having LFT's checked today. Refill sent to pharmacy.

## 2010-11-22 LAB — URINALYSIS, ROUTINE W REFLEX MICROSCOPIC
Bilirubin Urine: NEGATIVE
Ketones, ur: NEGATIVE mg/dL
Nitrite: NEGATIVE
Specific Gravity, Urine: 1.003 — ABNORMAL LOW (ref 1.005–1.030)
Urobilinogen, UA: 0.2 mg/dL (ref 0.0–1.0)

## 2010-11-23 LAB — CBC
Platelets: 331 10*3/uL (ref 150–400)
RDW: 12.1 % (ref 11.5–15.5)

## 2010-11-23 LAB — BASIC METABOLIC PANEL
BUN: 14 mg/dL (ref 6–23)
Calcium: 8.4 mg/dL (ref 8.4–10.5)
Creatinine, Ser: 0.8 mg/dL (ref 0.4–1.2)
GFR calc non Af Amer: >60 mL/min (ref >60)
Glucose, Bld: 82 mg/dL (ref 70–99)
Sodium: 141 mEq/L (ref 135–145)

## 2010-11-23 LAB — DIFFERENTIAL
Basophils Absolute: 0 10*3/uL (ref 0.0–0.1)
Lymphocytes Relative: 21 % (ref 12–46)
Neutro Abs: 4.1 10*3/uL (ref 1.7–7.7)
Neutrophils Relative %: 71 % (ref 43–77)

## 2010-11-23 LAB — POCT CARDIAC MARKERS
CKMB, poc: <1 ng/mL — ABNORMAL LOW (ref 1.0–8.0)
Troponin i, poc: <0.05 ng/mL (ref 0.00–0.09)

## 2010-12-05 ENCOUNTER — Telehealth: Payer: Self-pay | Admitting: Family

## 2010-12-05 NOTE — Telephone Encounter (Signed)
Patient would like results from blood work

## 2010-12-05 NOTE — Telephone Encounter (Signed)
Please advise 

## 2010-12-05 NOTE — Telephone Encounter (Signed)
LFTs were normal

## 2010-12-05 NOTE — Telephone Encounter (Signed)
Pt notified and requests an appt to have her cholesterol checked. Follow up scheduled for 12/12/10 @ 8am.

## 2010-12-07 ENCOUNTER — Encounter: Payer: Self-pay | Admitting: Family

## 2010-12-12 ENCOUNTER — Ambulatory Visit (INDEPENDENT_AMBULATORY_CARE_PROVIDER_SITE_OTHER): Payer: Self-pay | Admitting: Family

## 2010-12-12 ENCOUNTER — Telehealth: Payer: Self-pay | Admitting: *Deleted

## 2010-12-12 ENCOUNTER — Encounter: Payer: Self-pay | Admitting: Family

## 2010-12-12 VITALS — BP 120/70 | HR 60 | Temp 98.7°F | Resp 16 | Ht 65.0 in | Wt 169.0 lb

## 2010-12-12 DIAGNOSIS — J329 Chronic sinusitis, unspecified: Secondary | ICD-10-CM

## 2010-12-12 DIAGNOSIS — Z0000 Encounter for general adult medical examination without abnormal findings: Secondary | ICD-10-CM

## 2010-12-12 DIAGNOSIS — B360 Pityriasis versicolor: Secondary | ICD-10-CM

## 2010-12-12 DIAGNOSIS — B351 Tinea unguium: Secondary | ICD-10-CM

## 2010-12-12 LAB — CBC WITH DIFFERENTIAL/PLATELET
Eosinophils Absolute: 0.1 10*3/uL (ref 0.0–0.7)
Eosinophils Relative: 2 % (ref 0–5)
HCT: 42.7 % (ref 36.0–46.0)
Lymphs Abs: 1.4 10*3/uL (ref 0.7–4.0)
MCH: 30.6 pg (ref 26.0–34.0)
MCV: 88.8 fL (ref 78.0–100.0)
Monocytes Absolute: 0.4 10*3/uL (ref 0.1–1.0)
Platelets: 316 10*3/uL (ref 150–400)
RDW: 13.1 % (ref 11.5–15.5)

## 2010-12-12 LAB — LIPID PANEL
LDL Cholesterol: 164 mg/dL — ABNORMAL HIGH (ref 0–99)
Total CHOL/HDL Ratio: 4.7 Ratio

## 2010-12-12 MED ORDER — AMOXICILLIN 875 MG PO TABS: 875.0000 mg | ORAL_TABLET | Freq: Two times a day (BID) | ORAL | Status: AC

## 2010-12-12 MED ORDER — ALPRAZOLAM 0.25 MG PO TABS: 0.2500 mg | ORAL_TABLET | Freq: Every evening | ORAL | Status: DC | PRN

## 2010-12-12 MED ORDER — SELENIUM SULFIDE 2.5 % EX LOTN: TOPICAL_LOTION | CUTANEOUS | Status: DC

## 2010-12-12 NOTE — Assessment & Plan Note (Signed)
Will treat with selenium sulfide.

## 2010-12-12 NOTE — Assessment & Plan Note (Signed)
Treat with amoxicillin.  Pt instructed to contact us if symptoms worsen or if they do not improve.

## 2010-12-12 NOTE — Patient Instructions (Signed)
Follow up in 2 months, sooner if symptoms worsen or do not improve. Complete your blood work on the first floor.

## 2010-12-12 NOTE — Assessment & Plan Note (Signed)
Slowly improving, continue diflucan.  LFTs normal last visit.

## 2010-12-12 NOTE — Progress Notes (Signed)
Subjective:    Patient ID: Jamie [name], female    DOB: 12/03/1950, 60 y.o.   MRN: [id number]  HPI  Onychomycosis-  Improved  White spots- noticed them about 3 months ago- + itching.  Notes red rash after she scratches.  Congestion- coughing up mucous, + sinus pressure with nasal discharge (yellow).  Anxiety- uses about 2x a week.  Takes a half tab.     Review of Systems    see HPI  Past Medical History  Diagnosis Date  . Anxiety   . Hyperlipidemia   . Diverticulosis   . IBS (irritable bowel syndrome)     History   Social History  . Marital Status: Married    Spouse Name: N/A    Number of Children: N/A  . Years of Education: N/A   Occupational History  . Not on file.   Social History Main Topics  . Smoking status: Former Smoker    Types: Cigarettes    Quit date: 08/13/1985  . Smokeless tobacco: Not on file  . Alcohol Use: Yes     rare  . Drug Use: Not on file  . Sexually Active: Not on file   Other Topics Concern  . Not on file   Social History Narrative   Lives with husband and grandchild. School full time for paralegalCaffeine use; occasional    Past Surgical History  Procedure Date  . Abdominal hysterectomy   . Tubal ligation   . Hemorrhoid surgery     Family History  Problem Relation Age of Onset  . Cancer Mother     renal cell carcinoma  . Diabetes Mother   . Heart disease Father     first MI in 21s  . Cancer Sister     cervical cancer  . Heart disease Brother   . Alcohol abuse Sister   . Fibromyalgia Sister   . Arthritis Sister     osteoarthritis    No Known Allergies  Current Outpatient Prescriptions on File Prior to Visit  Medication Sig Dispense Refill  . cholecalciferol (VITAMIN D) 1000 UNITS tablet Take 1,000 Units by mouth daily.        . fluconazole (DIFLUCAN) 150 MG tablet TAKE ONE TABLET BY MOUTH ONCE A WEEK  4 tablet  1  . nitroGLYCERIN (NITROSTAT) 0.4 MG SL tablet Place 1 tablet under tongue at onset of chest pain;  you may repeat every 5 minutes for up to 3 doses. PT HAS NOT PICKED UP MED YET.       . NON FORMULARY Take 1 scoop by mouth daily. [name] FIBER  Mix 1 scoop in water daily.       . NON FORMULARY Take 1 tablet by mouth daily. MEMORY FACTOR        . Nutritional Supplements (WELLNESS ESSENTIALS FOR WOMEN PO) Take 1 capsule by mouth daily.        . vitamin B-12 (CYANOCOBALAMIN) 100 MCG tablet Take 2 tablets daily.       Marland Kitchen DISCONTD: ALPRAZolam (XANAX) 0.25 MG tablet Take 0.25 mg by mouth at bedtime as needed.        Marland Kitchen aspirin 81 MG tablet Take 81 mg by mouth daily.        Marland Kitchen dextromethorphan-guaiFENesin (MUCINEX DM) 30-600 MG per 12 hr tablet Take 1 tablet by mouth every 12 (twelve) hours. As needed for cough / congestion       . fluticasone (FLOVENT HFA) 44 MCG/ACT inhaler Inhale 2 puffs into the lungs  2 (two) times daily.        Marland Kitchen loratadine (CLARITIN) 10 MG tablet Take 10 mg by mouth daily.          BP 120/70  Pulse 60  Temp(Src) 98.7 F (37.1 C) (Oral)  Resp 16  Ht 5\' 5"  (1.651 m)  Wt 169 lb (76.658 kg)  BMI 28.12 kg/m2    Objective:   Physical Exam  Constitutional: She appears well-developed and well-nourished.  HENT:  Head: Normocephalic and atraumatic.  Right Ear: Tympanic membrane and ear canal normal.  Left Ear: Tympanic membrane and ear canal normal.       + bilateral maxillary sinus tenderness to palpation.  Eyes: Conjunctivae are normal. Pupils are equal, round, and reactive to light. No scleral icterus.  Cardiovascular: Normal rate and regular rhythm.   Pulmonary/Chest: Effort normal and breath sounds normal.  Skin:       Toenails remain thickened.    Suntanned skin with scattered hypopigmented patches on arms, between breasts and at the base of the back.   Psychiatric: She has a normal mood and affect. Her behavior is normal. Judgment and thought content normal.          Assessment & Plan:

## 2010-12-12 NOTE — Telephone Encounter (Signed)
Received fax from [facility] needing clarification of Selenium shampoo. Rx was sent for 2.5%, pharmacy states that it only comes in 2.25%. Advised Terrance at [facility] ok to dispense 2.25% per Sandford Craze, NP.

## 2010-12-13 ENCOUNTER — Encounter: Payer: Self-pay | Admitting: Family

## 2010-12-13 ENCOUNTER — Telehealth: Payer: Self-pay | Admitting: Family

## 2010-12-13 DIAGNOSIS — E785 Hyperlipidemia, unspecified: Secondary | ICD-10-CM | POA: Insufficient documentation

## 2010-12-13 MED ORDER — SIMVASTATIN 20 MG PO TABS: 20.0000 mg | ORAL_TABLET | Freq: Every day | ORAL | Status: DC

## 2010-12-13 NOTE — Telephone Encounter (Signed)
Left message on machine to return my call. 

## 2010-12-13 NOTE — Telephone Encounter (Signed)
Addended by: Mervin Kung on: 12/13/2010 11:44 AM   Modules accepted: Orders

## 2010-12-13 NOTE — Telephone Encounter (Signed)
Pt notified. Order has been entered and forwarded to the lab for August.

## 2010-12-13 NOTE — Telephone Encounter (Signed)
Please call patient and let her know that her cholesterol is high- 263.  I would like for her to start simvastatin and follow up in 3

## 2010-12-13 NOTE — Telephone Encounter (Signed)
Clarification- start simvastatin and follow up in 3 months for LFT and FLP.  Pt to call us if she develops muscle pain while on statin.

## 2011-02-01 ENCOUNTER — Other Ambulatory Visit: Payer: Self-pay | Admitting: Family

## 2011-02-02 NOTE — Telephone Encounter (Signed)
Please advise how many refills pt should have.

## 2011-02-09 NOTE — Telephone Encounter (Signed)
She can have a month with 1 refill.

## 2011-04-09 ENCOUNTER — Ambulatory Visit (INDEPENDENT_AMBULATORY_CARE_PROVIDER_SITE_OTHER): Payer: Self-pay | Admitting: Internal Medicine

## 2011-04-09 ENCOUNTER — Encounter: Payer: Self-pay | Admitting: Internal Medicine

## 2011-04-09 DIAGNOSIS — B360 Pityriasis versicolor: Secondary | ICD-10-CM

## 2011-04-09 DIAGNOSIS — J329 Chronic sinusitis, unspecified: Secondary | ICD-10-CM

## 2011-04-09 DIAGNOSIS — F341 Dysthymic disorder: Secondary | ICD-10-CM

## 2011-04-09 MED ORDER — FLUCONAZOLE 100 MG PO TABS: 100.0000 mg | ORAL_TABLET | Freq: Every day | ORAL | Status: AC

## 2011-04-09 MED ORDER — ALPRAZOLAM 0.25 MG PO TABS: 0.2500 mg | ORAL_TABLET | Freq: Every evening | ORAL | Status: DC | PRN

## 2011-04-09 MED ORDER — AMOXICILLIN-POT CLAVULANATE 875-125 MG PO TABS: 1.0000 | ORAL_TABLET | Freq: Two times a day (BID) | ORAL | Status: AC

## 2011-04-09 NOTE — Assessment & Plan Note (Signed)
Diffuse areas of hypopigmentation. reattempt diflucan 100mg  po qd x 10d. Consider dermatology consult if persists

## 2011-04-09 NOTE — Progress Notes (Signed)
  Subjective:    Patient ID: Jamie [name], female    DOB: 1951-03-28, 60 y.o.   MRN: [id number]  HPI Pt presents to clinic for evaluation of possible sinusitis. Notes 2 wk h/o green nasal drainage and cough productive for green sputum. Recently developed frontal sinus pressure/pain. Denies fever,chills, dyspnea, wheeze or tooth pain. No exacerbating or alleviating factors. Notes 9 month h/o areas of hypopigmentation located primarily dorsal hands, bilateral axilla, mid low back, neck and inguinal areas. Possible itching early on. Attempted diflucan 100mg  po q wk but only took twice. Attempted and failed otc creams. Also notes h/o anxiety taking xanax and requests refill.  Past Medical History  Diagnosis Date  . Anxiety   . Hyperlipidemia   . Diverticulosis   . IBS (irritable bowel syndrome)    Past Surgical History  Procedure Date  . Abdominal hysterectomy   . Tubal ligation   . Hemorrhoid surgery     reports that she quit smoking about 25 years ago. Her smoking use included Cigarettes. She has never used smokeless tobacco. She reports that she drinks alcohol. Her drug history not on file. family history includes Alcohol abuse in her sister; Arthritis in her sister; Cancer in her mother and sister; Diabetes in her mother; Fibromyalgia in her sister; and Heart disease in her brother and father. No Known Allergies     Review of Systems see hpi     Objective:   Physical Exam  Nursing note and vitals reviewed. Constitutional: She appears well-developed and well-nourished. No distress.  HENT:  Head: Normocephalic and atraumatic.  Right Ear: External ear normal.  Left Ear: External ear normal.  Nose: Nose normal.  Mouth/Throat: Oropharynx is clear and moist. No oropharyngeal exudate.       Mild bilateral canal cerumen. Visualized tm's nl. +mid frontal sinus tenderness to palpation.  Eyes: Conjunctivae are normal. Right eye exhibits no discharge. Left eye exhibits no discharge. No  scleral icterus.  Neck: Neck supple.  Cardiovascular: Normal rate, regular rhythm and normal heart sounds.  Exam reveals no gallop and no friction rub.   No murmur heard. Pulmonary/Chest: Effort normal and breath sounds normal. No respiratory distress. She has no wheezes. She has no rales.  Neurological: She is alert.  Skin: Skin is warm and dry. She is not diaphoretic. No erythema.       Diffuse areas of hypopigmentation located bilateral dorsal hands, lateral neck, and mid lumbar back.  Psychiatric: She has a normal mood and affect.          Assessment & Plan:

## 2011-04-09 NOTE — Assessment & Plan Note (Signed)
rf xanax for prn use. Advised pt potential for addiction and/or tolerance.

## 2011-04-09 NOTE — Assessment & Plan Note (Signed)
Begin augmentin. Followup if no improvement or worsening.  

## 2011-05-14 ENCOUNTER — Ambulatory Visit (INDEPENDENT_AMBULATORY_CARE_PROVIDER_SITE_OTHER): Payer: Self-pay | Admitting: Family

## 2011-05-14 ENCOUNTER — Encounter: Payer: Self-pay | Admitting: Family

## 2011-05-14 ENCOUNTER — Ambulatory Visit: Payer: Self-pay | Admitting: Family

## 2011-05-14 DIAGNOSIS — B360 Pityriasis versicolor: Secondary | ICD-10-CM

## 2011-05-14 DIAGNOSIS — J329 Chronic sinusitis, unspecified: Secondary | ICD-10-CM

## 2011-05-14 MED ORDER — DOXYCYCLINE HYCLATE 100 MG PO CAPS: 100.0000 mg | ORAL_CAPSULE | Freq: Two times a day (BID) | ORAL | Status: DC

## 2011-05-14 NOTE — Progress Notes (Signed)
Subjective:    Patient ID: Jamie [name], female    DOB: 1950/10/07, 60 y.o.   MRN: [id number]  HPI  [name] is a 60 yr old female who presents today with chief complaint of sinus pressure, headache. Green nasal drainage, bilateral ear pain.  Denies sore throat or fever.  She was hear in the end of August and was given rx for Augmentin by Dr. Rodena Medin which she tells me she could not afford therefore did not fill.    Tinea versicolor- notes that the area is no longer pruritic.  Completed diflucan.  Still has some "white patches" on her forearms.    Review of Systems See HPI  Past Medical History  Diagnosis Date  . Anxiety   . Hyperlipidemia   . Diverticulosis   . IBS (irritable bowel syndrome)     History   Social History  . Marital Status: Married    Spouse Name: N/A    Number of Children: N/A  . Years of Education: N/A   Occupational History  . Not on file.   Social History Main Topics  . Smoking status: Former Smoker    Types: Cigarettes    Quit date: 08/13/1985  . Smokeless tobacco: Never Used  . Alcohol Use: Yes     rare  . Drug Use: Not on file  . Sexually Active: Not on file   Other Topics Concern  . Not on file   Social History Narrative   Lives with husband and grandchild. School full time for paralegalCaffeine use; occasional    Past Surgical History  Procedure Date  . Abdominal hysterectomy   . Tubal ligation   . Hemorrhoid surgery     Family History  Problem Relation Age of Onset  . Cancer Mother     renal cell carcinoma  . Diabetes Mother   . Heart disease Father     first MI in 9s  . Cancer Sister     cervical cancer  . Heart disease Brother   . Alcohol abuse Sister   . Fibromyalgia Sister   . Arthritis Sister     osteoarthritis    No Known Allergies  Current Outpatient Prescriptions on File Prior to Visit  Medication Sig Dispense Refill  . ALPRAZolam (XANAX) 0.25 MG tablet Take 1 tablet (0.25 mg total) by mouth at bedtime  as needed.  30 tablet  1  . cholecalciferol (VITAMIN D) 1000 UNITS tablet Take 1,000 Units by mouth daily.        . nitroGLYCERIN (NITROSTAT) 0.4 MG SL tablet Place 1 tablet under tongue at onset of chest pain; you may repeat every 5 minutes for up to 3 doses. PT HAS NOT PICKED UP MED YET.       . NON FORMULARY Take 1 scoop by mouth daily. [name] FIBER  Mix 1 scoop in water daily.       . NON FORMULARY Take 1 tablet by mouth daily. MEMORY FACTOR        . Nutritional Supplements (WELLNESS ESSENTIALS FOR WOMEN PO) Take 1 capsule by mouth daily.        Marland Kitchen selenium sulfide (SELSUN) 2.5 % shampoo Apply to skin x 10 minutes a day for 7 days in a row.  118 mL  12  . vitamin B-12 (CYANOCOBALAMIN) 100 MCG tablet Take 2 tablets daily.         BP 102/82  Pulse 84  Temp(Src) 98.2 F (36.8 C) (Oral)  Resp 20  Ht 5\' 5"  (1.651 m)  Wt 172 lb 0.6 oz (78.037 kg)  BMI 28.63 kg/m2       Objective:   Physical Exam  Constitutional: She appears well-developed and well-nourished. No distress.  HENT:  Head: Normocephalic and atraumatic.  Mouth/Throat: No oropharyngeal exudate.       Bilateral TM's dull without erythema or bulging.   Cardiovascular: Normal rate and regular rhythm.   No murmur heard. Pulmonary/Chest: Effort normal and breath sounds normal. No respiratory distress. She has no wheezes. She has no rales. She exhibits no tenderness.  Psychiatric: She has a normal mood and affect. Her behavior is normal. Judgment and thought content normal.  skin:  Few small hypopigmented patches noted on bilateral forearms.         Assessment & Plan:

## 2011-05-14 NOTE — Assessment & Plan Note (Signed)
Deteriorated.  Will plan to treat with doxycycline.

## 2011-05-14 NOTE — Patient Instructions (Signed)

## 2011-05-14 NOTE — Assessment & Plan Note (Signed)
This is improved.  I have instructed her to complete the 7 day use of selsun shampoo.

## 2011-05-15 LAB — COMPREHENSIVE METABOLIC PANEL
BUN: 18
Calcium: 9.9
Creatinine, Ser: 1
Glucose, Bld: 102 — ABNORMAL HIGH
Total Protein: 7.9

## 2011-05-15 LAB — URINALYSIS, ROUTINE W REFLEX MICROSCOPIC
Glucose, UA: NEGATIVE
Hgb urine dipstick: NEGATIVE
Specific Gravity, Urine: 1.026
Urobilinogen, UA: 0.2
pH: 5.5

## 2011-05-15 LAB — DIFFERENTIAL
Basophils Relative: 1
Lymphocytes Relative: 17
Lymphs Abs: 1
Monocytes Relative: 7
Neutro Abs: 4.8
Neutrophils Relative %: 75

## 2011-05-15 LAB — URINE MICROSCOPIC-ADD ON

## 2011-05-15 LAB — CBC
HCT: 38.7
Hemoglobin: 13.4
MCHC: 34.7
Platelets: 270
RDW: 11.6

## 2011-05-21 ENCOUNTER — Telehealth: Payer: Self-pay | Admitting: *Deleted

## 2011-05-21 MED ORDER — AMOXICILLIN 500 MG PO CAPS: 1000.0000 mg | ORAL_CAPSULE | Freq: Three times a day (TID) | ORAL | Status: AC

## 2011-05-21 NOTE — Telephone Encounter (Signed)
Received call from pt stating she had to stop doxycycline due to diarrhea, nausea and stomach cramping. Pt would like an alternative. Please advise.

## 2011-05-21 NOTE — Telephone Encounter (Signed)
Left message on machine to return my call. 

## 2011-05-21 NOTE — Telephone Encounter (Signed)
She should start amoxicillin in place of doxycycline. Rx has been sent to pharmacy.

## 2011-05-22 NOTE — Telephone Encounter (Signed)
Left message on machine to return my call. 

## 2011-05-22 NOTE — Telephone Encounter (Signed)
Notified pt. She states she has taken amoxicillin in the past for the similar symtoms and it did not help. Advised pt a higher dose of amoxicillin has been prescribed this time. She states she will try it. Please advise if there are additional instructions for pt.

## 2011-05-22 NOTE — Telephone Encounter (Signed)
Patient returned phone call. Best phone# [phone]

## 2011-05-22 NOTE — Telephone Encounter (Signed)
No further recommendations at this time.

## 2011-06-07 ENCOUNTER — Ambulatory Visit (HOSPITAL_BASED_OUTPATIENT_CLINIC_OR_DEPARTMENT_OTHER)
Admission: RE | Admit: 2011-06-07 | Discharge: 2011-06-07 | Disposition: A | Discharge status: Home / Self Care | Payer: Self-pay | Source: Ambulatory Visit | Attending: Internal Medicine | Admitting: Internal Medicine

## 2011-06-07 ENCOUNTER — Emergency Department (INDEPENDENT_AMBULATORY_CARE_PROVIDER_SITE_OTHER): Payer: Self-pay

## 2011-06-07 ENCOUNTER — Other Ambulatory Visit: Payer: Self-pay

## 2011-06-07 ENCOUNTER — Ambulatory Visit (INDEPENDENT_AMBULATORY_CARE_PROVIDER_SITE_OTHER): Payer: Self-pay | Admitting: Internal Medicine

## 2011-06-07 ENCOUNTER — Encounter: Payer: Self-pay | Admitting: Internal Medicine

## 2011-06-07 ENCOUNTER — Emergency Department (HOSPITAL_BASED_OUTPATIENT_CLINIC_OR_DEPARTMENT_OTHER)
Admission: EM | Admit: 2011-06-07 | Discharge: 2011-06-07 | Disposition: A | Discharge status: Home / Self Care | Payer: Self-pay | Attending: Emergency Medicine | Admitting: Emergency Medicine

## 2011-06-07 ENCOUNTER — Encounter (HOSPITAL_BASED_OUTPATIENT_CLINIC_OR_DEPARTMENT_OTHER): Payer: Self-pay | Admitting: Family Medicine

## 2011-06-07 VITALS — BP 200/49 | HR 112 | Temp 98.0°F | Resp 18 | Wt 173.0 lb

## 2011-06-07 DIAGNOSIS — R109 Unspecified abdominal pain: Secondary | ICD-10-CM

## 2011-06-07 DIAGNOSIS — K5792 Diverticulitis of intestine, part unspecified, without perforation or abscess without bleeding: Secondary | ICD-10-CM

## 2011-06-07 DIAGNOSIS — K589 Irritable bowel syndrome without diarrhea: Secondary | ICD-10-CM | POA: Insufficient documentation

## 2011-06-07 DIAGNOSIS — K5732 Diverticulitis of large intestine without perforation or abscess without bleeding: Secondary | ICD-10-CM | POA: Insufficient documentation

## 2011-06-07 DIAGNOSIS — R079 Chest pain, unspecified: Secondary | ICD-10-CM

## 2011-06-07 DIAGNOSIS — M549 Dorsalgia, unspecified: Secondary | ICD-10-CM | POA: Insufficient documentation

## 2011-06-07 DIAGNOSIS — R000 Tachycardia, unspecified: Secondary | ICD-10-CM

## 2011-06-07 DIAGNOSIS — R112 Nausea with vomiting, unspecified: Secondary | ICD-10-CM | POA: Insufficient documentation

## 2011-06-07 DIAGNOSIS — I517 Cardiomegaly: Secondary | ICD-10-CM

## 2011-06-07 LAB — URINALYSIS, ROUTINE W REFLEX MICROSCOPIC
Glucose, UA: NEGATIVE mg/dL
Hgb urine dipstick: NEGATIVE
Protein, ur: NEGATIVE mg/dL

## 2011-06-07 LAB — COMPREHENSIVE METABOLIC PANEL
CO2: 29 mEq/L (ref 19–32)
Calcium: 9.7 mg/dL (ref 8.4–10.5)
Creatinine, Ser: 0.7 mg/dL (ref 0.50–1.10)
GFR calc Af Amer: >90 mL/min (ref >90)
GFR calc non Af Amer: >90 mL/min (ref >90)
Glucose, Bld: 117 mg/dL — ABNORMAL HIGH (ref 70–99)

## 2011-06-07 LAB — DIFFERENTIAL
Eosinophils Relative: 0 % (ref 0–5)
Lymphocytes Relative: 10 % — ABNORMAL LOW (ref 12–46)
Lymphs Abs: 1.3 10*3/uL (ref 0.7–4.0)
Monocytes Absolute: 0.9 10*3/uL (ref 0.1–1.0)

## 2011-06-07 LAB — CBC
HCT: 40.8 % (ref 36.0–46.0)
MCV: 87.2 fL (ref 78.0–100.0)
RBC: 4.68 MIL/uL (ref 3.87–5.11)
RDW: 12.6 % (ref 11.5–15.5)
WBC: 12.9 10*3/uL — ABNORMAL HIGH (ref 4.0–10.5)

## 2011-06-07 MED ORDER — METRONIDAZOLE IN NACL 5-0.79 MG/ML-% IV SOLN
500.0000 mg | Freq: Once | INTRAVENOUS | Status: AC
  Administered 2011-06-07: 500 mg via INTRAVENOUS
  Filled 2011-06-07: qty 100

## 2011-06-07 MED ORDER — CIPROFLOXACIN HCL 500 MG PO TABS: 500.0000 mg | ORAL_TABLET | Freq: Two times a day (BID) | ORAL | Status: DC

## 2011-06-07 MED ORDER — ONDANSETRON HCL 4 MG/2ML IJ SOLN
4.0000 mg | Freq: Once | INTRAMUSCULAR | Status: AC
  Administered 2011-06-07: 4 mg via INTRAVENOUS
  Filled 2011-06-07: qty 2

## 2011-06-07 MED ORDER — METRONIDAZOLE 500 MG PO TABS: 500.0000 mg | ORAL_TABLET | Freq: Two times a day (BID) | ORAL | Status: DC

## 2011-06-07 MED ORDER — CIPROFLOXACIN IN D5W 400 MG/200ML IV SOLN
400.0000 mg | Freq: Once | INTRAVENOUS | Status: AC
  Administered 2011-06-07: 400 mg via INTRAVENOUS
  Filled 2011-06-07: qty 200

## 2011-06-07 MED ORDER — IOHEXOL 300 MG/ML  SOLN
100.0000 mL | Freq: Once | INTRAMUSCULAR | Status: AC | PRN
  Administered 2011-06-07: 100 mL via INTRAVENOUS

## 2011-06-07 MED ORDER — SODIUM CHLORIDE 0.9 % IV SOLN
999.0000 mL | INTRAVENOUS | Status: DC
  Administered 2011-06-07: 1000 mL via INTRAVENOUS
  Administered 2011-06-07: 999 mL via INTRAVENOUS

## 2011-06-07 MED ORDER — HYDROMORPHONE HCL 1 MG/ML IJ SOLN
1.0000 mg | Freq: Once | INTRAMUSCULAR | Status: AC
  Administered 2011-06-07: 1 mg via INTRAVENOUS
  Filled 2011-06-07: qty 1

## 2011-06-07 NOTE — ED Notes (Signed)
Called to room as patient c/o "chest pressure" across entire precordium after receiving pain medications.  Vitals stable.  RRR, CTAB.  Pain does not radiate, no SOB, cough, fever, diaphoresis.  Appears anxious and c/o nausea.   Date: 06/07/2011  Rate: 75  Rhythm: normal sinus rhythm  QRS Axis: normal  Intervals: normal  ST/T Wave abnormalities: normal  Conduction Disutrbances:none  Narrative Interpretation:   Old EKG Reviewed: none available  No evidence of ischemia on EKG, negative stress test from Dec 2011 reviewed.  Will treat symptoms and check cardiac marker.  Glynn Octave, MD 06/07/11 2008

## 2011-06-07 NOTE — ED Notes (Signed)
Pt c/o abdominal and back pain x 10 days, headache x 2 months and nausea.

## 2011-06-07 NOTE — ED Notes (Signed)
Complains of chest pressure ,husband at bedside.  ekg done . EDP notified,in to see pt

## 2011-06-07 NOTE — ED Notes (Signed)
Patient reports chest pressure improved. EKG troponin unremarkable. Chest x-ray shows no acute disease. With recent stress test she is stable for outpatient followup.  Glynn Octave, MD 06/07/11 2102

## 2011-06-07 NOTE — ED Notes (Signed)
Went to remove pt's IV per RN , pt stated she was having chest pressure, reported this off to RN. EKG completed and shown to ED Doctor

## 2011-06-07 NOTE — ED Provider Notes (Signed)
Medical screening examination/treatment/procedure(s) were performed by non-physician practitioner and as supervising physician I was immediately available for consultation/collaboration.  Ethelda Chick, MD 06/07/11 [phone]

## 2011-06-07 NOTE — ED Notes (Signed)
Has had several episodes of crying and anxiety.did not like the feel from the dilaudid, at all, became very anxious.  Tolerated snack and po flds.  Has been up to the BR x 2 without any problems.  Talked to family on phone

## 2011-06-07 NOTE — ED Provider Notes (Signed)
Medical screening examination/treatment/procedure(s) were performed by non-physician practitioner and as supervising physician I was immediately available for consultation/collaboration.  Ethelda Chick, MD 06/07/11 [phone]

## 2011-06-07 NOTE — ED Provider Notes (Addendum)
History     CSN: [id number] Arrival date & time: 06/07/2011  4:14 PM   First MD Initiated Contact with Patient 06/07/11 1618      Chief Complaint  Patient presents with  . Abdominal Pain  . Back Pain    (Consider location/radiation/quality/duration/timing/severity/associated sxs/prior treatment) HPI Comments: Patient seen by her PCP today for abdominal pain.  Concern for exacerbation of diverticulitis or kidney stones. Sent to ED for completion of workup.  Patient is a 60 y.o. female presenting with abdominal pain and back pain. The history is provided by the patient. No language interpreter was used.  Abdominal Pain The primary symptoms of the illness include abdominal pain, nausea and vomiting. The current episode started more than 2 days ago. The onset of the illness was gradual. The problem has been gradually worsening.  Nausea began more than 1 week ago.  The vomiting began today. Vomiting occurs 2 to 5 times per day. The emesis contains stomach contents.  The patient states that she believes she is currently not pregnant. The patient has not had a change in bowel habit. Additional symptoms associated with the illness include anorexia and back pain. Significant associated medical issues include diverticulitis.  Back Pain  Associated symptoms include abdominal pain.    Past Medical History  Diagnosis Date  . Anxiety   . Hyperlipidemia   . Diverticulosis   . IBS (irritable bowel syndrome)     Past Surgical History  Procedure Date  . Abdominal hysterectomy   . Tubal ligation   . Hemorrhoid surgery     Family History  Problem Relation Age of Onset  . Cancer Mother     renal cell carcinoma  . Diabetes Mother   . Heart disease Father     first MI in 45s  . Cancer Sister     cervical cancer  . Heart disease Brother   . Alcohol abuse Sister   . Fibromyalgia Sister   . Arthritis Sister     osteoarthritis    History  Substance Use Topics  . Smoking status:  Former Smoker    Types: Cigarettes    Quit date: 08/13/1985  . Smokeless tobacco: Never Used  . Alcohol Use: Yes     rare    OB History    Grav Para Term Preterm Abortions TAB SAB Ect Mult Living                  Review of Systems  Constitutional: Positive for appetite change.  Gastrointestinal: Positive for nausea, vomiting, abdominal pain, blood in stool and anorexia.  Genitourinary: Positive for flank pain.  Musculoskeletal: Positive for back pain.  All other systems reviewed and are negative.    Allergies  Review of patient's allergies indicates no known allergies.  Home Medications   Current Outpatient Rx  Name Route Sig Dispense Refill  . ALPRAZOLAM 0.25 MG PO TABS Oral Take 0.25 mg by mouth at bedtime as needed. For anxiety or sleep     . VITAMIN D 1000 UNITS PO TABS Oral Take 1,000 Units by mouth daily.      . IBUPROFEN 200 MG PO TABS Oral Take 400 mg by mouth 2 (two) times daily as needed. For pain     . NON FORMULARY Oral Take 1 scoop by mouth daily. [name] FIBER  Mix 1 scoop in water daily.     . NON FORMULARY Oral Take 1 tablet by mouth daily. MEMORY FACTOR      . WELLNESS  ESSENTIALS FOR WOMEN PO Oral Take 1 capsule by mouth daily.      Marland Kitchen VITAMIN B-12 100 MCG PO TABS  Take 2 tablets daily.     Marland Kitchen LORATADINE 10 MG PO TABS Oral Take 10 mg by mouth daily.     Marland Kitchen NITROGLYCERIN 0.4 MG SL SUBL  Place 1 tablet under tongue at onset of chest pain; you may repeat every 5 minutes for up to 3 doses. PT HAS NOT PICKED UP MED YET.     . SELENIUM SULFIDE 2.5 % EX LOTN  Apply to skin x 10 minutes a day for 7 days in a row. 118 mL 12    BP 121/70  Pulse 93  Temp(Src) 99.9 F (37.7 C) (Oral)  Resp 18  Ht 5' 5.5" (1.664 m)  Wt 173 lb (78.472 kg)  BMI 28.35 kg/m2  SpO2 100%  Physical Exam  Constitutional: She is oriented to person, place, and time. She appears well-developed and well-nourished.  HENT:  Head: Normocephalic and atraumatic.  Eyes: Pupils are  equal, round, and reactive to light.  Neck: Normal range of motion. Neck supple.  Cardiovascular: Normal rate, regular rhythm and normal heart sounds.   Pulmonary/Chest: Effort normal and breath sounds normal.  Abdominal: Soft. Bowel sounds are normal. There is tenderness in the right lower quadrant and left lower quadrant. There is no rebound and no CVA tenderness.  Musculoskeletal: Normal range of motion.  Neurological: She is alert and oriented to person, place, and time.  Skin: Skin is warm and dry.  Psychiatric: She has a normal mood and affect.    ED Course  Procedures (including critical care time)  Labs Reviewed  CBC - Abnormal; Notable for the following:    WBC 12.9 (*)    All other components within normal limits  DIFFERENTIAL - Abnormal; Notable for the following:    Neutrophils Relative 83 (*)    Neutro Abs 10.7 (*)    Lymphocytes Relative 10 (*)    All other components within normal limits  COMPREHENSIVE METABOLIC PANEL - Abnormal; Notable for the following:    Glucose, Bld 117 (*)    All other components within normal limits  LIPASE, BLOOD  URINALYSIS, ROUTINE W REFLEX MICROSCOPIC   Ct Abdomen Pelvis W Contrast  06/07/2011  *RADIOLOGY REPORT*  Clinical Data: Severe abdominal pain., history of diverticulosis and irritable bowel syndrome.  Prior hysterectomy.  CT ABDOMEN AND PELVIS WITH CONTRAST  Technique:  Multidetector CT imaging of the abdomen and pelvis was performed following the standard protocol during bolus administration of intravenous contrast.  Contrast: OMNIPAQUE IOHEXOL 300 MG/ML IV SOLN  Comparison: CT abdomen 03/16/2008  Findings: Lung bases are clear.  No pericardial fluid.  No focal hepatic lesion.  The gallbladder, pancreas, spleen, adrenal glands, and kidneys are normal.  There is extrarenal pelvis on the right similar to prior.  The stomach, small bowel, appendix, and cecum are normal.  There are scattered diverticula throughout the colon.  There  is a 6 cm segment of mucosal wall thickening with pericolonic inflammation within the mid sigmoid colon (images 57-66).  Findings are most consistent acute diverticulitis.  There is no evidence of obstruction proximal to suggest mass.  Abdominal aorta normal caliber.  No retroperitoneal lymphadenopathy.  No free fluid the pelvis.  The bladder is normal.  Post hysterectomy anatomy.  No pelvic lymphadenopathy. Review of  bone windows demonstrates no aggressive osseous lesions.  IMPRESSION:  1.  Acute uncomplicated sigmoid diverticulitis. 2.  Recommend colonoscopy or follow up imaging after appropriate therapy to exclude underlying mucosal lesion.  Original Report Authenticated By: Genevive Bi, M.D.     No diagnosis found.    MDM  Diverticulitis exacerbation        Jimmye Norman, NP 06/07/11 1819  Jimmye Norman, NP 06/07/11 [phone]

## 2011-06-08 ENCOUNTER — Telehealth: Payer: Self-pay | Admitting: *Deleted

## 2011-06-08 MED ORDER — TRAMADOL HCL 50 MG PO TABS: 50.0000 mg | ORAL_TABLET | Freq: Four times a day (QID) | ORAL | Status: DC | PRN

## 2011-06-08 NOTE — Telephone Encounter (Signed)
Call placed to patient at [phone], she was informed that she had a mild infaraction, and antibiotics.

## 2011-06-08 NOTE — Telephone Encounter (Signed)
Call placed to patient at [phone], she was informed Dr Rodena Medin advises follow up next week. Appointment scheduled for Wednesday 06/13/2011 @ 9:30 am.

## 2011-06-08 NOTE — Telephone Encounter (Signed)
Patient was provided Rx's for Flagyl 500 bid qty 14, and Cipro 500 bid qty -14 at ER visit 06/07/2011. Patient stated she was advised admit, but declined. She inquired about lab results. Patient spoke with Dr Rodena Medin regarding her symptoms. He advised Rx for Ultram 50 mg by mouth every 6 hours as needed qty 30 no refills. Rx was sent to [facility].

## 2011-06-10 DIAGNOSIS — R109 Unspecified abdominal pain: Secondary | ICD-10-CM | POA: Insufficient documentation

## 2011-06-10 NOTE — Progress Notes (Signed)
  Subjective:    Patient ID: Jamie [name], female    DOB: [birth year], 60 y.o.   MRN: [id number]  HPI Pt presents to clinic as work in for evaluation of abdominal pain. Notes ~1wk h/o bilateral lbp and lower abdominal pain without radiation. +nausea without vomiting or urinary sx's. No f/c. +loose stools without blood. BP elevated with pain and repeated 130/92.   Past Medical History  Diagnosis Date  . Anxiety   . Hyperlipidemia   . Diverticulosis   . IBS (irritable bowel syndrome)    Past Surgical History  Procedure Date  . Abdominal hysterectomy   . Tubal ligation   . Hemorrhoid surgery     reports that she quit smoking about 25 years ago. Her smoking use included Cigarettes. She has never used smokeless tobacco. She reports that she drinks alcohol. She reports that she does not use illicit drugs. family history includes Alcohol abuse in her sister; Arthritis in her sister; Cancer in her mother and sister; Diabetes in her mother; Fibromyalgia in her sister; and Heart disease in her brother and father. No Known Allergies     Review of Systems see hpi     Objective:   Physical Exam  Nursing note and vitals reviewed. Constitutional: She appears well-developed and well-nourished. She appears distressed.  HENT:  Head: Normocephalic and atraumatic.  Eyes: Conjunctivae are normal. No scleral icterus.  Cardiovascular: Regular rhythm.  Tachycardia present.  Exam reveals no friction rub.   No murmur heard. Pulmonary/Chest: Effort normal and breath sounds normal. No respiratory distress. She has no wheezes.  Abdominal: Soft. Bowel sounds are normal. She exhibits no distension and no mass. There is tenderness. There is no rebound and no guarding.  Neurological: She is alert.  Skin: Skin is warm. She is not diaphoretic.          Assessment & Plan:

## 2011-06-10 NOTE — Assessment & Plan Note (Signed)
Attempted to obtain cbc, chem7, lft and abd/pelvic ct however given severity of sx's and time required to obtain results will transfer to ED for further evaluation. Pt and ED apprised of change in plan.

## 2011-06-13 ENCOUNTER — Ambulatory Visit (INDEPENDENT_AMBULATORY_CARE_PROVIDER_SITE_OTHER): Payer: Self-pay | Admitting: Internal Medicine

## 2011-06-13 ENCOUNTER — Encounter: Payer: Self-pay | Admitting: Internal Medicine

## 2011-06-13 ENCOUNTER — Encounter: Payer: Self-pay | Admitting: Gastroenterology

## 2011-06-13 DIAGNOSIS — D72829 Elevated white blood cell count, unspecified: Secondary | ICD-10-CM

## 2011-06-13 DIAGNOSIS — K6389 Other specified diseases of intestine: Secondary | ICD-10-CM

## 2011-06-13 DIAGNOSIS — R7309 Other abnormal glucose: Secondary | ICD-10-CM

## 2011-06-13 DIAGNOSIS — R739 Hyperglycemia, unspecified: Secondary | ICD-10-CM

## 2011-06-13 DIAGNOSIS — K639 Disease of intestine, unspecified: Secondary | ICD-10-CM

## 2011-06-13 DIAGNOSIS — R109 Unspecified abdominal pain: Secondary | ICD-10-CM

## 2011-06-13 DIAGNOSIS — E785 Hyperlipidemia, unspecified: Secondary | ICD-10-CM

## 2011-06-13 LAB — HEPATIC FUNCTION PANEL
ALT: 41 U/L — ABNORMAL HIGH (ref 0–35)
AST: 41 U/L — ABNORMAL HIGH (ref 0–37)
Bilirubin, Direct: 0.1 mg/dL (ref 0.0–0.3)

## 2011-06-13 LAB — CBC
MCH: 30.5 pg (ref 26.0–34.0)
MCV: 89.5 fL (ref 78.0–100.0)
Platelets: 324 10*3/uL (ref 150–400)
RDW: 12.8 % (ref 11.5–15.5)
WBC: 5.6 10*3/uL (ref 4.0–10.5)

## 2011-06-13 LAB — LIPID PANEL
Cholesterol: 234 mg/dL — ABNORMAL HIGH (ref 0–200)
HDL: 63 mg/dL (ref >39)
Total CHOL/HDL Ratio: 3.7 Ratio

## 2011-06-13 MED ORDER — METRONIDAZOLE 500 MG PO TABS: 500.0000 mg | ORAL_TABLET | Freq: Two times a day (BID) | ORAL | Status: AC

## 2011-06-13 MED ORDER — CIPROFLOXACIN HCL 500 MG PO TABS: 500.0000 mg | ORAL_TABLET | Freq: Two times a day (BID) | ORAL | Status: AC

## 2011-06-16 NOTE — Progress Notes (Signed)
  Subjective:    Patient ID: Jamie [name], female    DOB: 07/26/1951, 60 y.o.   MRN: [id number]  HPI Pt presents to clinic for follow up of diverticulitis. Seen recently with significant abdominal pain and was transferred to the ED for evaluation. CT reviewed as c/w diverticulitis but gi recommended to consider colonoscopy as with wall thickening could not exclude luminal abn. Treated with cipro and flagyl finishing today. Feels 70% improved. Pain improved. No f/c. + nausea without emesis. Has had chronic ha's suspected to be contributed to by sinusitis. Notes ha's much improved with cipro/flagyl. No other complaints.  Past Medical History  Diagnosis Date  . Anxiety   . Hyperlipidemia   . Diverticulosis   . IBS (irritable bowel syndrome)    Past Surgical History  Procedure Date  . Abdominal hysterectomy   . Tubal ligation   . Hemorrhoid surgery     reports that she quit smoking about 25 years ago. Her smoking use included Cigarettes. She has never used smokeless tobacco. She reports that she drinks alcohol. She reports that she does not use illicit drugs. family history includes Alcohol abuse in her sister; Arthritis in her sister; Cancer in her mother and sister; Diabetes in her mother; Fibromyalgia in her sister; and Heart disease in her brother and father. No Known Allergies   Review of Systems see hpi     Objective:   Physical Exam  Nursing note and vitals reviewed. Constitutional: She appears well-developed and well-nourished. No distress.  HENT:  Head: Normocephalic and atraumatic.  Eyes: Conjunctivae are normal. No scleral icterus.  Abdominal: Soft. Normal appearance and bowel sounds are normal. She exhibits no distension and no mass. There is no hepatosplenomegaly. There is tenderness in the right lower quadrant. There is no rebound and no guarding.  Skin: She is not diaphoretic.          Assessment & Plan:

## 2011-06-16 NOTE — Assessment & Plan Note (Signed)
Improving. Extend cipro/flagyl course. Gi consult. Repeat cbc at pt's request due to mild leukocytosis. Dietary handout provided.

## 2011-06-16 NOTE — Assessment & Plan Note (Signed)
Obtain lipid 

## 2011-06-21 ENCOUNTER — Telehealth: Payer: Self-pay | Admitting: *Deleted

## 2011-06-21 MED ORDER — PANTOPRAZOLE SODIUM 40 MG PO TBEC: 40.0000 mg | DELAYED_RELEASE_TABLET | Freq: Every day | ORAL | Status: DC

## 2011-06-21 NOTE — Telephone Encounter (Signed)
Call returned to patient [phone], she was informed per Dr Rodena Medin instructions. She stated she prefers the Protonix. She was advised if no improvement in symptoms prior to GI appointment. She was advised to schedule office visit. Patient verbalized understanding and agrees.

## 2011-06-21 NOTE — Telephone Encounter (Signed)
Don't see ppi in chart. Recommend starting omeprazole 40mg  po qd #30 rf2 or protonix 40mg  po qd #30 rf 2

## 2011-06-21 NOTE — Telephone Encounter (Signed)
Patient would like to know if Dr Rodena Medin would see if her appointment with GI could be moved up sooner. She stated that she is continuing to have a lot of problems with chest pain and indigestion. She was offered appointment with Dr Rodena Medin for evaluation of her chest pain, however she declines. She stated that she had called GI for earlier appointment, but was advised there was nothing available. She was asked to check back with the provider.

## 2011-06-26 ENCOUNTER — Telehealth: Payer: Self-pay | Admitting: *Deleted

## 2011-06-26 MED ORDER — LEVOFLOXACIN 500 MG PO TABS: 500.0000 mg | ORAL_TABLET | Freq: Every day | ORAL | Status: DC

## 2011-06-26 NOTE — Telephone Encounter (Signed)
If having diverticulitis flare then recommend abx. levaquin 500mg  po qd x7d. Ok to take ultram prn and can refill if needed for 1-2 q 6 hours prn but do not recommend narcotic pain medication for abdominal pain.

## 2011-06-26 NOTE — Telephone Encounter (Signed)
Call placed to patient at [phone], she was advised per Dr Rodena Medin instructions. She stated she has a Rx for Ultram; she was informed Rx for Levaquin will be sent to pharmacy.

## 2011-06-26 NOTE — Telephone Encounter (Signed)
Patient called and left voice message stating she is having a diverticulitis flare up and has abdominal pain in the bottom of her stomach. She would like to know if Dr Rodena Medin could prescribe something else to ease the pain. Her message stated the previous medication has not helped.

## 2011-06-27 ENCOUNTER — Telehealth: Payer: Self-pay | Admitting: Family

## 2011-06-27 NOTE — Telephone Encounter (Signed)
Can repeat previous combination of cipro and flagyl (same dosing as before) for 7 days

## 2011-06-27 NOTE — Telephone Encounter (Signed)
DR HODGIN CALLED IN [facility]  IT IS $70 FOR THE PRESCRIPTION.  SHE CANNOT AFFORD THIS.  SHE NEEDS SOMETHING DIFFERENT CALLED IN THAT WOULD BE LESS EXPENSIVE

## 2011-06-28 MED ORDER — METRONIDAZOLE 500 MG PO TABS: 500.0000 mg | ORAL_TABLET | Freq: Two times a day (BID) | ORAL | Status: AC

## 2011-06-28 MED ORDER — CIPROFLOXACIN HCL 500 MG PO TABS: 500.0000 mg | ORAL_TABLET | Freq: Two times a day (BID) | ORAL | Status: AC

## 2011-06-28 NOTE — Telephone Encounter (Signed)
Call placed to patient at [phone], no answer. A detailed voice message was left informing patient of change in medication to pharmacy.

## 2011-07-03 ENCOUNTER — Ambulatory Visit (INDEPENDENT_AMBULATORY_CARE_PROVIDER_SITE_OTHER): Payer: Self-pay | Admitting: Gastroenterology

## 2011-07-03 ENCOUNTER — Other Ambulatory Visit (INDEPENDENT_AMBULATORY_CARE_PROVIDER_SITE_OTHER): Payer: Self-pay

## 2011-07-03 ENCOUNTER — Encounter: Payer: Self-pay | Admitting: Gastroenterology

## 2011-07-03 VITALS — BP 124/68 | HR 88 | Ht 65.0 in | Wt 173.0 lb

## 2011-07-03 DIAGNOSIS — K5730 Diverticulosis of large intestine without perforation or abscess without bleeding: Secondary | ICD-10-CM

## 2011-07-03 HISTORY — DX: Diverticulosis of large intestine without perforation or abscess without bleeding: K57.30

## 2011-07-03 LAB — CBC WITH DIFFERENTIAL/PLATELET
Basophils Relative: 0.4 % (ref 0.0–3.0)
Eosinophils Absolute: 0.1 10*3/uL (ref 0.0–0.7)
Eosinophils Relative: 1.3 % (ref 0.0–5.0)
HCT: 40 % (ref 36.0–46.0)
Lymphs Abs: 1 10*3/uL (ref 0.7–4.0)
MCHC: 34.9 g/dL (ref 30.0–36.0)
MCV: 89.8 fl (ref 78.0–100.0)
Monocytes Absolute: 0.5 10*3/uL (ref 0.1–1.0)
Neutro Abs: 3.6 10*3/uL (ref 1.4–7.7)
Neutrophils Relative %: 68.8 % (ref 43.0–77.0)
RBC: 4.45 Mil/uL (ref 3.87–5.11)

## 2011-07-03 LAB — COMPREHENSIVE METABOLIC PANEL
AST: 31 U/L (ref 0–37)
Alkaline Phosphatase: 56 U/L (ref 39–117)
BUN: 12 mg/dL (ref 6–23)
Creatinine, Ser: 1 mg/dL (ref 0.4–1.2)
Glucose, Bld: 97 mg/dL (ref 70–99)
Potassium: 4.5 mEq/L (ref 3.5–5.1)
Total Bilirubin: 0.7 mg/dL (ref 0.3–1.2)

## 2011-07-03 NOTE — Patient Instructions (Addendum)
You will be set up for a CT scan of abdomen and pelvis with IV and oral contrast to check on recent diverticulitis, continued left sided tenderness.    [place] [facility] ([address]---this is in the same building as [facility]ural [facility]).   You are scheduled on 07/06/11 at 10 am . You should arrive 15 minutes prior to your appointment time for registration. Please follow the written instructions below on the day of your exam:  WARNING: IF YOU ARE ALLERGIC TO IODINE/X-RAY DYE, PLEASE NOTIFY RADIOLOGY IMMEDIATELY AT [phone]! YOU WILL BE GIVEN A 13 HOUR PREMEDICATION PREP.  1) Do not eat or drink anything after 6 am (4 hours prior to your test) 2) You have been given 2 bottles of oral contrast to drink. The solution may taste better if refrigerated, but do NOT add ice or any other liquid to this solution. Shake  well before drinking.    Drink 1 bottle of contrast @ 8 am (2 hours prior to your exam)  Drink 1 bottle of contrast @ 9 am  (1 hour prior to your exam)  You may take any medications as prescribed with a small amount of water except for the following: Metformin, Glucophage, Glucovance, Avandamet, Riomet, Fortamet, Actoplus Met, Janumet, Glumetza or Metaglip. The above medications must be held the day of the exam AND 48 hours after the exam.  The purpose of you drinking the oral contrast is to aid in the visualization of your intestinal tract. The contrast solution may cause some diarrhea. Before your exam is started, you will be given a small amount of fluid to drink. Depending on your individual set of symptoms, you may also receive an intravenous injection of x-ray contrast/dye. Plan on being at [facility] for 30 minutes or long, depending on the type of exam you are having performed.  If you have any questions regarding your exam or if you need to reschedule, you may call the CT department at [phone] between the hours of 8:00 am and 5:00 pm,  Monday-Friday.  ________________________________________________________________________       Bonita Quin will have labs checked today in the basement lab.  Please head down after you check out with the front desk  (cbc, cmet). May need to start different antibiotics (augmentin) depending on the above results.

## 2011-07-03 NOTE — Progress Notes (Signed)
Review of pertinent gastrointestinal problems: 1. routine risk for colon cancer. Colonoscopy 2009 by me found left-sided diverticulosis, internal/external hemorrhoids. No polyps or cancers. She was recommended to have repeat colonoscopy in 10 years. This was done for minor rectal bleeding and lower, pain. 2. mild, acute diverticulitis October 2012. Thickening of her left colon for about 6-8 cm on CT scan. Consistent with diverticulitis. She had presented with abdominal pain, elevated white count with left shift. Treated with antibiotics appropriately  HPI: This is a  very pleasant 60 year old woman whom I last saw about 2-3 years ago.  Pains started on right side, then moved to left side.  Then bilateral.  No fevers or chills.  + nausea with starting of antibiotics. Has had loosening of her stools.  She had an elevated white blood cell count with a left shift. Her pains improved after a two-week course of ciprofloxacin and Flagyl however shortly after stopping her pains recurred. She was restarted on another two-week course and was sent here for further followup.  She is still having Lower, pains, no fevers or chills. Pains are predominantly left sided.  Was eating a lot of salads  Past Medical History  Diagnosis Date  . Anxiety   . Hyperlipidemia   . Diverticulosis   . IBS (irritable bowel syndrome)   . Depression     Past Surgical History  Procedure Date  . Abdominal hysterectomy   . Tubal ligation   . Hemorrhoid surgery   . Tumor removal     Left thigh     Current Outpatient Prescriptions  Medication Sig Dispense Refill  . ALPRAZolam (XANAX) 0.25 MG tablet Take 0.25 mg by mouth at bedtime as needed. For anxiety or sleep       . bismuth subsalicylate (PEPTO-BISMOL) 262 MG/15ML suspension Take 15 mLs by mouth as needed.        . ciprofloxacin (CIPRO) 500 MG tablet Take 1 tablet (500 mg total) by mouth 2 (two) times daily.  14 tablet  0  . metroNIDAZOLE (FLAGYL) 500 MG tablet Take  1 tablet (500 mg total) by mouth 2 (two) times daily.  14 tablet  0  . nitroGLYCERIN (NITROSTAT) 0.4 MG SL tablet Place 1 tablet under tongue at onset of chest pain; you may repeat every 5 minutes for up to 3 doses. PT HAS NOT PICKED UP MED YET.       . NON FORMULARY Take 1 scoop by mouth daily. [name] FIBER  Mix 1 scoop in water daily.       . NON FORMULARY Take 1 tablet by mouth daily. MEMORY FACTOR        . Nutritional Supplements (WELLNESS ESSENTIALS FOR WOMEN PO) Take 1 capsule by mouth daily.        . pantoprazole (PROTONIX) 40 MG tablet Take 1 tablet (40 mg total) by mouth daily.  30 tablet  2    Allergies as of 07/03/2011  . (No Known Allergies)    Family History  Problem Relation Age of Onset  . Cancer Mother     renal cell carcinoma  . Diabetes Mother   . Heart disease Father     first MI in 32s  . Cancer Sister     cervical cancer  . Heart disease Brother   . Alcohol abuse Sister   . Fibromyalgia Sister   . Arthritis Sister     osteoarthritis  . Irritable bowel syndrome Sister     History   Social History  .  Marital Status: Married    Spouse Name: N/A    Number of Children: 3  . Years of Education: N/A   Occupational History  . Student    Social History Main Topics  . Smoking status: Former Smoker    Types: Cigarettes    Quit date: 08/13/1985  . Smokeless tobacco: Never Used  . Alcohol Use: Yes     rare  . Drug Use: No  . Sexually Active: Not on file   Other Topics Concern  . Not on file   Social History Narrative   Lives with husband and grandchild. School full time for paralegalCaffeine use; occasional      Physical Exam: Ht 5\' 5"  (1.651 m)  Wt 173 lb (78.472 kg)  BMI 28.79 kg/m2 Constitutional: generally well-appearing Psychiatric: alert and oriented x3 Abdomen: soft, mildly tender lower abdomen left greater than right, nondistended, no obvious ascites, no peritoneal signs, normal bowel sounds     Assessment and plan: 60  y.o. female with recent diverticulitis, still with abdominal tenderness and left greater than right pain  I think she's repeat imaging to check for more complicated diverticulitis problem such as abscess. We'll likely have to change her antibiotics around. She will also get a repeat set of labs today including a CBC, complete metabolic profile. She does not have peritoneal signs or fevers or chills right now.

## 2011-07-04 ENCOUNTER — Other Ambulatory Visit: Payer: Self-pay | Admitting: Gastroenterology

## 2011-07-04 ENCOUNTER — Telehealth: Payer: Self-pay | Admitting: Gastroenterology

## 2011-07-04 DIAGNOSIS — K5730 Diverticulosis of large intestine without perforation or abscess without bleeding: Secondary | ICD-10-CM

## 2011-07-04 NOTE — Telephone Encounter (Signed)
Left message on machine to call back  

## 2011-07-04 NOTE — Telephone Encounter (Signed)
Pt CT was rescheduled for [place] at [phone] same date and time  Pt is aware

## 2011-07-06 ENCOUNTER — Inpatient Hospital Stay (HOSPITAL_BASED_OUTPATIENT_CLINIC_OR_DEPARTMENT_OTHER): Admission: RE | Admit: 2011-07-06 | Payer: Self-pay | Source: Ambulatory Visit

## 2011-07-06 ENCOUNTER — Other Ambulatory Visit: Payer: Self-pay

## 2011-07-09 ENCOUNTER — Ambulatory Visit (HOSPITAL_BASED_OUTPATIENT_CLINIC_OR_DEPARTMENT_OTHER)
Admission: RE | Admit: 2011-07-09 | Discharge: 2011-07-09 | Disposition: A | Discharge status: Home / Self Care | Payer: Self-pay | Source: Ambulatory Visit | Attending: Gastroenterology | Admitting: Gastroenterology

## 2011-07-09 DIAGNOSIS — J984 Other disorders of lung: Secondary | ICD-10-CM | POA: Insufficient documentation

## 2011-07-09 DIAGNOSIS — K5730 Diverticulosis of large intestine without perforation or abscess without bleeding: Secondary | ICD-10-CM

## 2011-07-09 DIAGNOSIS — K5732 Diverticulitis of large intestine without perforation or abscess without bleeding: Secondary | ICD-10-CM | POA: Insufficient documentation

## 2011-07-09 MED ORDER — IOHEXOL 300 MG/ML  SOLN: 100.0000 mL | Freq: Once | INTRAMUSCULAR | Status: AC | PRN

## 2011-07-19 ENCOUNTER — Telehealth: Payer: Self-pay | Admitting: *Deleted

## 2011-07-19 MED ORDER — METRONIDAZOLE 500 MG PO TABS: 500.0000 mg | ORAL_TABLET | Freq: Three times a day (TID) | ORAL | Status: AC

## 2011-07-19 MED ORDER — CIPROFLOXACIN HCL 500 MG PO TABS: 500.0000 mg | ORAL_TABLET | Freq: Two times a day (BID) | ORAL | Status: AC

## 2011-07-19 NOTE — Telephone Encounter (Signed)
Patient presented to office with sister at her appointment.  She discussed with Dr Rodena Medin about refilling medication for her. Per Dr Rodena Medin Rx for Flagy 500 tid x 7 days and Cipro 500 bid x 7 days to patient pharmacy.  Rx sent to pharmacy.

## 2011-08-02 ENCOUNTER — Institutional Professional Consult (permissible substitution): Payer: Self-pay | Admitting: Critical Care Medicine

## 2011-08-16 ENCOUNTER — Other Ambulatory Visit: Payer: Self-pay | Admitting: Critical Care Medicine

## 2011-08-16 ENCOUNTER — Ambulatory Visit (INDEPENDENT_AMBULATORY_CARE_PROVIDER_SITE_OTHER): Payer: Self-pay | Admitting: Critical Care Medicine

## 2011-08-16 ENCOUNTER — Encounter: Payer: Self-pay | Admitting: Critical Care Medicine

## 2011-08-16 VITALS — BP 110/82 | HR 100 | Temp 98.0°F | Ht 65.5 in | Wt 173.0 lb

## 2011-08-16 DIAGNOSIS — R0600 Dyspnea, unspecified: Secondary | ICD-10-CM

## 2011-08-16 DIAGNOSIS — J45909 Unspecified asthma, uncomplicated: Secondary | ICD-10-CM

## 2011-08-16 DIAGNOSIS — J45902 Unspecified asthma with status asthmaticus: Secondary | ICD-10-CM

## 2011-08-16 DIAGNOSIS — R0609 Other forms of dyspnea: Secondary | ICD-10-CM

## 2011-08-16 DIAGNOSIS — R0989 Other specified symptoms and signs involving the circulatory and respiratory systems: Secondary | ICD-10-CM

## 2011-08-16 MED ORDER — MOMETASONE FUROATE 220 MCG/INH IN AEPB: 2.0000 | INHALATION_SPRAY | Freq: Every day | RESPIRATORY_TRACT | Status: DC

## 2011-08-16 NOTE — Patient Instructions (Addendum)
Start asmanex two puff daily Allergy test (blood draw) today Follow reflux diet Take protonix 1/2 hour before meal Return 1 month for recheck

## 2011-08-16 NOTE — Progress Notes (Signed)
Subjective:    Patient ID: Jamie [name], female    DOB: 1950/10/22, 61 y.o.   MRN: [id number]  Shortness of Breath This is a chronic problem. The current episode started more than 1 year ago. The problem occurs daily (dyspneic at rest, and exertion). The problem has been gradually worsening. Associated symptoms include abdominal pain, chest pain, headaches, leg pain, PND, sputum production and wheezing. Pertinent negatives include no ear pain, fever, hemoptysis, leg swelling, neck pain, orthopnea, rash, rhinorrhea, sore throat, swollen glands, syncope or vomiting. The symptoms are aggravated by any activity, exercise, eating, fumes, odors, pollens and smoke. Associated symptoms comments: Chest pain is sharp , worse with deep breath ED 30days ago ?[facility],  CT scan done neg for PE . Risk factors include smoking. She has tried nothing for the symptoms. Her past medical history is significant for asthma. There is no history of allergies, aspirin allergies, bronchiolitis, CAD, chronic lung disease, COPD, DVT, a heart failure, PE, pneumonia or a recent surgery.      Review of Systems  Constitutional: Negative for fever, chills, diaphoresis, activity change, appetite change, fatigue and unexpected weight change.  HENT: Positive for congestion, sneezing and sinus pressure. Negative for hearing loss, ear pain, nosebleeds, sore throat, facial swelling, rhinorrhea, mouth sores, trouble swallowing, neck pain, neck stiffness, dental problem, voice change, postnasal drip, tinnitus and ear discharge.   Eyes: Positive for itching and visual disturbance. Negative for photophobia and discharge.  Respiratory: Positive for cough, sputum production, chest tightness, shortness of breath and wheezing. Negative for apnea, hemoptysis, choking and stridor.   Cardiovascular: Positive for chest pain, palpitations and PND. Negative for orthopnea, leg swelling and syncope.  Gastrointestinal: Positive for nausea and abdominal pain.  Negative for vomiting, constipation, blood in stool and abdominal distention.       Has QHS belching   Genitourinary: Negative for dysuria, urgency, frequency, hematuria, flank pain, decreased urine volume and difficulty urinating.  Musculoskeletal: Positive for back pain. Negative for myalgias, joint swelling, arthralgias and gait problem.  Skin: Positive for color change. Negative for pallor and rash.       Whites spots all over body  Neurological: Positive for dizziness, light-headedness, numbness and headaches. Negative for tremors, seizures, syncope, speech difficulty and weakness.  Hematological: Negative for adenopathy. Bruises/bleeds easily.  Psychiatric/Behavioral: Negative for confusion, sleep disturbance and agitation. The patient is nervous/anxious.        Objective:   Physical Exam Filed Vitals:   08/16/11 1422  BP: 110/82  Pulse: 100  Temp: 98 F (36.7 C)  TempSrc: Oral  Height: 5' 5.5" (1.664 m)  Weight: 173 lb (78.472 kg)  SpO2: 97%    Gen: Pleasant, well-nourished, in no distress,  normal affect  ENT: No lesions,  mouth clear,  oropharynx clear, no postnasal drip  Neck: No JVD, no TMG, no carotid bruits  Lungs: No use of accessory muscles, no dullness to percussion, few exp wheezes, distant BS  Cardiovascular: RRR, heart sounds normal, no murmur or gallops, no peripheral edema  Abdomen: soft and NT, no HSM,  BS normal  Musculoskeletal: No deformities, no cyanosis or clubbing  Neuro: alert, non focal  Skin: Warm, no lesions or rashes  CXR 06/07/11: IMPRESSION:  1. Borderline cardiomegaly without failure.  2. No acute cardiopulmonary disease or significant interval  change.  Spiro: Fef 25 75 73% FeV1 78%         Assessment & Plan:   Extrinsic asthma, unspecified Moderate persistent asthma with findings of airway  obstruction on spirometry  Ppt factors may be atopy and GERD Note normal CXR Plan Start asmanex two puff daily Allergy test  (blood draw) today Follow reflux diet Take protonix 1/2 hour before meal Return 1 month for recheck      Updated Medication List Outpatient Encounter Prescriptions as of 08/16/2011  Medication Sig Dispense Refill  . ALPRAZolam (XANAX) 0.25 MG tablet Take 0.25 mg by mouth at bedtime as needed. For anxiety or sleep       . bismuth subsalicylate (PEPTO-BISMOL) 262 MG/15ML suspension Take 15 mLs by mouth as needed.        . nitroGLYCERIN (NITROSTAT) 0.4 MG SL tablet       . NON FORMULARY Take 1 scoop by mouth daily. [name] FIBER  Mix 1 scoop in water daily.       . NON FORMULARY Take 1 tablet by mouth daily. MEMORY FACTOR        . Nutritional Supplements (WELLNESS ESSENTIALS FOR WOMEN PO) Take 1 capsule by mouth daily.        . pantoprazole (PROTONIX) 40 MG tablet Take 1 tablet (40 mg total) by mouth daily.  30 tablet  2  . mometasone (ASMANEX 120 METERED DOSES) 220 MCG/INH inhaler Inhale 2 puffs into the lungs daily.  1 Inhaler  6

## 2011-08-17 LAB — ALLERGY FULL PROFILE
Allergen, D pternoyssinus,d7: <0.1 kU/L (ref <0.35)
Alternaria Alternata: <0.1 kU/L (ref <0.35)
Aspergillus fumigatus, IgG: <0.1 kU/L (ref <0.35)
Bahia Grass: <0.1 kU/L (ref <0.35)
Cat Dander: <0.1 kU/L (ref <0.35)
Common Ragweed: <0.1 kU/L (ref <0.35)
Dog Dander: <0.1 kU/L (ref <0.35)
Goldenrod: <0.1 kU/L (ref <0.35)
Helminthosporium halodes: <0.1 kU/L (ref <0.35)
House Dust Hollister: <0.1 kU/L (ref <0.35)
Lamb's Quarters: <0.1 kU/L (ref <0.35)
Plantain: <0.1 kU/L (ref <0.35)
Sycamore Tree: <0.1 kU/L (ref <0.35)

## 2011-08-17 NOTE — Assessment & Plan Note (Signed)
Moderate persistent asthma with findings of airway obstruction on spirometry  Ppt factors may be atopy and GERD Note normal CXR Plan Start asmanex two puff daily Allergy test (blood draw) today Follow reflux diet Take protonix 1/2 hour before meal Return 1 month for recheck

## 2011-08-20 NOTE — Progress Notes (Signed)
Quick Note:  Call pt and tell her labs are ok Allergy tests were negative  No change in medications ______

## 2011-09-07 ENCOUNTER — Ambulatory Visit (INDEPENDENT_AMBULATORY_CARE_PROVIDER_SITE_OTHER): Payer: Self-pay | Admitting: Family

## 2011-09-07 ENCOUNTER — Encounter: Payer: Self-pay | Admitting: Family

## 2011-09-07 VITALS — BP 114/78 | HR 89 | Temp 98.2°F | Resp 18 | Wt 173.0 lb

## 2011-09-07 DIAGNOSIS — F341 Dysthymic disorder: Secondary | ICD-10-CM

## 2011-09-07 DIAGNOSIS — J020 Streptococcal pharyngitis: Secondary | ICD-10-CM | POA: Insufficient documentation

## 2011-09-07 DIAGNOSIS — J029 Acute pharyngitis, unspecified: Secondary | ICD-10-CM

## 2011-09-07 MED ORDER — AMOXICILLIN 500 MG PO TABS: ORAL_TABLET | ORAL | Status: AC

## 2011-09-07 MED ORDER — ALPRAZOLAM 0.25 MG PO TABS: 0.2500 mg | ORAL_TABLET | Freq: Every evening | ORAL | Status: DC | PRN

## 2011-09-07 MED ORDER — CITALOPRAM HYDROBROMIDE 20 MG PO TABS: 20.0000 mg | ORAL_TABLET | Freq: Every day | ORAL | Status: DC

## 2011-09-07 NOTE — Assessment & Plan Note (Signed)
Deteriorated, especially anxiety. Will give her a trial of citalopram. She has been on this medication in the past. Side effects were reviewed with the patient.

## 2011-09-07 NOTE — Patient Instructions (Signed)
Citalopram 20mg  1/2 tablet by mouth daily for 1 week, then increase to a full tablet daily on week two as tolerated. Call if you develop fever >101, if symptoms worsen, or if you are not improved in 3 days.  You may use tylenol for pain. Follow up in 1 month.

## 2011-09-07 NOTE — Assessment & Plan Note (Signed)
Plan to treat with Amoxicillin for 10 days.

## 2011-09-07 NOTE — Progress Notes (Signed)
Subjective:    Patient ID: Jamie [name], female    DOB: 08-29-1950, 61 y.o.   MRN: [id number]  HPI  [name] is a 61 yr old female who presents today with chief complaint of sore throat.  She denies known fever.  Her chest has been tender with movement.  Anxiety/depression- she reports that she is feeling better.  Tries to stay busy.  She is not sleeping well. Panic attacks several times a week.  She tells me that she is very unhappy in her marriage and is contemplating divorce.    Review of Systems See HPI   Past Medical History  Diagnosis Date  . Anxiety   . Hyperlipidemia   . Diverticulosis   . IBS (irritable bowel syndrome)   . Depression     History   Social History  . Marital Status: Married    Spouse Name: N/A    Number of Children: 3  . Years of Education: N/A   Occupational History  . Student    Social History Main Topics  . Smoking status: Former Smoker -- 0.8 packs/day for 20 years    Types: Cigarettes    Quit date: 08/13/1985  . Smokeless tobacco: Never Used  . Alcohol Use: Yes     rare  . Drug Use: No  . Sexually Active: Not on file   Other Topics Concern  . Not on file   Social History Narrative   Lives with husband and grandchild. School full time for paralegalCaffeine use; occasional    Past Surgical History  Procedure Date  . Abdominal hysterectomy   . Tubal ligation   . Hemorrhoid surgery   . Tumor removal     Left thigh     Family History  Problem Relation Age of Onset  . Cancer Mother     renal cell carcinoma  . Diabetes Mother   . Heart disease Father     first MI in 41s  . Cancer Sister     cervical cancer  . Heart disease Brother   . Alcohol abuse Sister   . Fibromyalgia Sister   . Arthritis Sister     osteoarthritis  . Irritable bowel syndrome Sister   . Leukemia      grandson    No Known Allergies  Current Outpatient Prescriptions on File Prior to Visit  Medication Sig Dispense Refill  . bismuth  subsalicylate (PEPTO-BISMOL) 262 MG/15ML suspension Take 15 mLs by mouth as needed.        . mometasone (ASMANEX 120 METERED DOSES) 220 MCG/INH inhaler Inhale 2 puffs into the lungs daily.  1 Inhaler  6  . nitroGLYCERIN (NITROSTAT) 0.4 MG SL tablet       . NON FORMULARY Take 1 scoop by mouth daily. [name] FIBER  Mix 1 scoop in water daily.       . NON FORMULARY Take 1 tablet by mouth daily. MEMORY FACTOR        . Nutritional Supplements (WELLNESS ESSENTIALS FOR WOMEN PO) Take 1 capsule by mouth daily.        Marland Kitchen ALPRAZolam (XANAX) 0.25 MG tablet Take 0.25 mg by mouth at bedtime as needed. For anxiety or sleep       . pantoprazole (PROTONIX) 40 MG tablet Take 1 tablet (40 mg total) by mouth daily.  30 tablet  2    BP 114/78  Pulse 89  Temp(Src) 98.2 F (36.8 C) (Oral)  Resp 18  Wt 173 lb 0.6 oz (  78.49 kg)  SpO2 97%       Objective:   Physical Exam  Constitutional: She appears well-developed and well-nourished. No distress.  HENT:  Head: Normocephalic and atraumatic.  Right Ear: Tympanic membrane and ear canal normal.  Left Ear: Tympanic membrane and ear canal normal.  Mouth/Throat: Oropharyngeal exudate and posterior oropharyngeal erythema present. No posterior oropharyngeal edema.  Eyes: Conjunctivae are normal. No scleral icterus.  Neck: Normal range of motion. Neck supple.  Cardiovascular: Normal rate and regular rhythm.   No murmur heard. Pulmonary/Chest: Breath sounds normal. No respiratory distress. She has no wheezes. She has no rales. She exhibits no tenderness.  Musculoskeletal: She exhibits no edema.  Lymphadenopathy:    She has no cervical adenopathy.  Psychiatric: Her behavior is normal. Judgment and thought content normal.       Pleasant, mildly flat affect.          Assessment & Plan:

## 2011-09-20 ENCOUNTER — Ambulatory Visit: Payer: Self-pay | Admitting: Critical Care Medicine

## 2011-10-01 ENCOUNTER — Encounter (HOSPITAL_BASED_OUTPATIENT_CLINIC_OR_DEPARTMENT_OTHER): Payer: Self-pay | Admitting: *Deleted

## 2011-10-01 ENCOUNTER — Other Ambulatory Visit: Payer: Self-pay

## 2011-10-01 ENCOUNTER — Emergency Department (INDEPENDENT_AMBULATORY_CARE_PROVIDER_SITE_OTHER): Payer: Self-pay

## 2011-10-01 ENCOUNTER — Observation Stay (HOSPITAL_BASED_OUTPATIENT_CLINIC_OR_DEPARTMENT_OTHER)
Admission: EM | Admit: 2011-10-01 | Discharge: 2011-10-02 | Disposition: A | Discharge status: Hospice | Payer: 59 | Attending: Internal Medicine | Admitting: Internal Medicine

## 2011-10-01 DIAGNOSIS — J45909 Unspecified asthma, uncomplicated: Secondary | ICD-10-CM | POA: Insufficient documentation

## 2011-10-01 DIAGNOSIS — F411 Generalized anxiety disorder: Secondary | ICD-10-CM | POA: Insufficient documentation

## 2011-10-01 DIAGNOSIS — B351 Tinea unguium: Secondary | ICD-10-CM | POA: Insufficient documentation

## 2011-10-01 DIAGNOSIS — E785 Hyperlipidemia, unspecified: Secondary | ICD-10-CM | POA: Insufficient documentation

## 2011-10-01 DIAGNOSIS — B360 Pityriasis versicolor: Secondary | ICD-10-CM | POA: Insufficient documentation

## 2011-10-01 DIAGNOSIS — K5730 Diverticulosis of large intestine without perforation or abscess without bleeding: Secondary | ICD-10-CM | POA: Insufficient documentation

## 2011-10-01 DIAGNOSIS — R0789 Other chest pain: Principal | ICD-10-CM | POA: Insufficient documentation

## 2011-10-01 DIAGNOSIS — R079 Chest pain, unspecified: Secondary | ICD-10-CM

## 2011-10-01 DIAGNOSIS — K589 Irritable bowel syndrome without diarrhea: Secondary | ICD-10-CM | POA: Insufficient documentation

## 2011-10-01 DIAGNOSIS — J329 Chronic sinusitis, unspecified: Secondary | ICD-10-CM | POA: Insufficient documentation

## 2011-10-01 DIAGNOSIS — F3289 Other specified depressive episodes: Secondary | ICD-10-CM | POA: Insufficient documentation

## 2011-10-01 DIAGNOSIS — F329 Major depressive disorder, single episode, unspecified: Secondary | ICD-10-CM | POA: Insufficient documentation

## 2011-10-01 LAB — D-DIMER, QUANTITATIVE: D-Dimer, Quant: 0.22 ug/mL-FEU (ref 0.00–0.48)

## 2011-10-01 LAB — COMPREHENSIVE METABOLIC PANEL
ALT: 27 U/L (ref 0–35)
BUN: 13 mg/dL (ref 6–23)
CO2: 29 mEq/L (ref 19–32)
Calcium: 9.9 mg/dL (ref 8.4–10.5)
GFR calc Af Amer: >90 mL/min (ref >90)
GFR calc non Af Amer: 80 mL/min — ABNORMAL LOW (ref >90)
Glucose, Bld: 128 mg/dL — ABNORMAL HIGH (ref 70–99)
Sodium: 143 mEq/L (ref 135–145)

## 2011-10-01 LAB — DIFFERENTIAL
Basophils Absolute: 0 10*3/uL (ref 0.0–0.1)
Basophils Relative: 1 % (ref 0–1)
Eosinophils Absolute: 0 10*3/uL (ref 0.0–0.7)
Eosinophils Relative: 0 % (ref 0–5)
Lymphocytes Relative: 17 % (ref 12–46)
Monocytes Absolute: 0.3 10*3/uL (ref 0.1–1.0)

## 2011-10-01 LAB — CARDIAC PANEL(CRET KIN+CKTOT+MB+TROPI)
CK, MB: 1.6 ng/mL (ref 0.3–4.0)
Relative Index: INVALID (ref 0.0–2.5)
Total CK: 56 U/L (ref 7–177)

## 2011-10-01 LAB — CBC
HCT: 40.2 % (ref 36.0–46.0)
MCH: 30.4 pg (ref 26.0–34.0)
MCH: 30.6 pg (ref 26.0–34.0)
MCHC: 34.8 g/dL (ref 30.0–36.0)
MCHC: 34.3 g/dL (ref 30.0–36.0)
MCV: 87.4 fL (ref 78.0–100.0)
MCV: 89.1 fL (ref 78.0–100.0)
Platelets: 269 10*3/uL (ref 150–400)
RBC: 4.05 MIL/uL (ref 3.87–5.11)
RDW: 12.8 % (ref 11.5–15.5)
RDW: 12.8 % (ref 11.5–15.5)

## 2011-10-01 LAB — TSH: TSH: 1.791 u[IU]/mL (ref 0.350–4.500)

## 2011-10-01 LAB — CREATININE, SERUM: Creatinine, Ser: 0.68 mg/dL (ref 0.50–1.10)

## 2011-10-01 MED ORDER — HEPARIN SODIUM (PORCINE) 5000 UNIT/ML IJ SOLN
5000.0000 [IU] | Freq: Three times a day (TID) | INTRAMUSCULAR | Status: DC
  Administered 2011-10-01 – 2011-10-02 (×3): 5000 [IU] via SUBCUTANEOUS
  Filled 2011-10-01 (×6): qty 1

## 2011-10-01 MED ORDER — SODIUM CHLORIDE 0.9 % IJ SOLN
3.0000 mL | Freq: Two times a day (BID) | INTRAMUSCULAR | Status: DC
  Administered 2011-10-01 – 2011-10-02 (×2): 3 mL via INTRAVENOUS

## 2011-10-01 MED ORDER — HYDROMORPHONE HCL PF 1 MG/ML IJ SOLN
1.0000 mg | Freq: Once | INTRAMUSCULAR | Status: AC
  Administered 2011-10-01: 1 mg via INTRAVENOUS

## 2011-10-01 MED ORDER — ACETAMINOPHEN 325 MG PO TABS
650.0000 mg | ORAL_TABLET | Freq: Four times a day (QID) | ORAL | Status: DC | PRN
  Administered 2011-10-01 – 2011-10-02 (×2): 650 mg via ORAL
  Filled 2011-10-01 (×2): qty 2

## 2011-10-01 MED ORDER — METOPROLOL TARTRATE 12.5 MG HALF TABLET
12.5000 mg | ORAL_TABLET | Freq: Two times a day (BID) | ORAL | Status: DC
  Administered 2011-10-01: 12.5 mg via ORAL
  Filled 2011-10-01 (×4): qty 1

## 2011-10-01 MED ORDER — SODIUM CHLORIDE 0.9 % IV SOLN: 250.0000 mL | INTRAVENOUS | Status: DC | PRN

## 2011-10-01 MED ORDER — PSYLLIUM 95 % PO PACK
1.0000 | PACK | Freq: Every day | ORAL | Status: DC | PRN
  Filled 2011-10-01: qty 1

## 2011-10-01 MED ORDER — NITROGLYCERIN 0.4 MG SL SUBL
0.4000 mg | SUBLINGUAL_TABLET | SUBLINGUAL | Status: AC | PRN
  Administered 2011-10-01 (×3): 0.4 mg via SUBLINGUAL
  Filled 2011-10-01: qty 25

## 2011-10-01 MED ORDER — SODIUM CHLORIDE 0.9 % IJ SOLN: 3.0000 mL | INTRAMUSCULAR | Status: DC | PRN

## 2011-10-01 MED ORDER — ONDANSETRON HCL 4 MG PO TABS
4.0000 mg | ORAL_TABLET | Freq: Four times a day (QID) | ORAL | Status: DC | PRN
  Administered 2011-10-01: 4 mg via ORAL
  Filled 2011-10-01: qty 1

## 2011-10-01 MED ORDER — MORPHINE SULFATE 4 MG/ML IJ SOLN: 3.0000 mg | INTRAMUSCULAR | Status: DC | PRN

## 2011-10-01 MED ORDER — PANTOPRAZOLE SODIUM 40 MG PO TBEC
40.0000 mg | DELAYED_RELEASE_TABLET | Freq: Two times a day (BID) | ORAL | Status: DC
  Administered 2011-10-01 – 2011-10-02 (×2): 40 mg via ORAL
  Filled 2011-10-01 (×2): qty 1

## 2011-10-01 MED ORDER — SODIUM CHLORIDE 0.9 % IJ SOLN
3.0000 mL | Freq: Two times a day (BID) | INTRAMUSCULAR | Status: DC
  Administered 2011-10-01: 3 mL via INTRAVENOUS

## 2011-10-01 MED ORDER — ONDANSETRON HCL 4 MG/2ML IJ SOLN
4.0000 mg | Freq: Once | INTRAMUSCULAR | Status: AC
  Administered 2011-10-01: 4 mg via INTRAVENOUS
  Filled 2011-10-01: qty 2

## 2011-10-01 MED ORDER — FLUTICASONE PROPIONATE HFA 44 MCG/ACT IN AERO
2.0000 | INHALATION_SPRAY | Freq: Two times a day (BID) | RESPIRATORY_TRACT | Status: DC
  Filled 2011-10-01: qty 10.6

## 2011-10-01 MED ORDER — HYDROMORPHONE HCL PF 1 MG/ML IJ SOLN
INTRAMUSCULAR | Status: AC
  Filled 2011-10-01: qty 1

## 2011-10-01 MED ORDER — ACETAMINOPHEN 650 MG RE SUPP: 650.0000 mg | Freq: Four times a day (QID) | RECTAL | Status: DC | PRN

## 2011-10-01 MED ORDER — ASPIRIN 81 MG PO CHEW
324.0000 mg | CHEWABLE_TABLET | Freq: Once | ORAL | Status: AC
  Administered 2011-10-01: 324 mg via ORAL
  Filled 2011-10-01: qty 4

## 2011-10-01 MED ORDER — NITROGLYCERIN 0.4 MG SL SUBL: 0.4000 mg | SUBLINGUAL_TABLET | SUBLINGUAL | Status: DC | PRN

## 2011-10-01 MED ORDER — ALPRAZOLAM 0.25 MG PO TABS
0.2500 mg | ORAL_TABLET | Freq: Three times a day (TID) | ORAL | Status: DC | PRN
  Administered 2011-10-01 – 2011-10-02 (×3): 0.25 mg via ORAL
  Filled 2011-10-01 (×4): qty 1

## 2011-10-01 MED ORDER — ASPIRIN 325 MG PO TABS
325.0000 mg | ORAL_TABLET | Freq: Every day | ORAL | Status: DC
  Administered 2011-10-02: 325 mg via ORAL
  Filled 2011-10-01 (×2): qty 1

## 2011-10-01 MED ORDER — ONDANSETRON HCL 4 MG/2ML IJ SOLN: 4.0000 mg | Freq: Four times a day (QID) | INTRAMUSCULAR | Status: DC | PRN

## 2011-10-01 NOTE — H&P (Signed)
PCP:   Lemont Fillers., NP, NP   Chief Complaint:  Chest pain.  HPI: This is a 61 year old year female, with known history of anxiety, depression, dyslipidemia, IBS, diverticulosis, s/p hemorrhoidectomy, bilateral tubal ligation and hysterectomy. Hospitalized in 07/1010 for chest pain and underwent stress myoview by Dr Fortino Sic on 07/15/10, which showed no evidence of ischemia,and an EF of 73%. Has experienced chest pain for the past 2 days. According to patient, she went to visit a friend's son in jail on 09/30/11 AM, and spent about 2 hours there. She felt quite "traumatized" by the experience, and did not feel quite right afterwards. She got home and lay on the cough, feeling quite drained of energy, and at about 3:30 PM, she felt lower sternal /epigastric "pressure", which felt like a band across her lower chest and radiated upward into the right side of her neck. According to her, this has remained constant, associated with diaphoresis, nausea, and initially, with shortness of breath. At about 9:00 PM, she went to bed, fell asleep, but awoke at 2:00 AM on 10/01/11, with the same symptoms. She was unable to go back to sleep, and at 7:00 AM, she went to the ED at [facility], from where she was sent as a direct admission, to [facility].  Allergies:   Allergies  Allergen Reactions  . Shellfish Allergy Swelling      Past Medical History  Diagnosis Date  . Anxiety   . Hyperlipidemia   . Diverticulosis   . IBS (irritable bowel syndrome)   . Depression     Past Surgical History  Procedure Date  . Abdominal hysterectomy   . Tubal ligation   . Hemorrhoid surgery   . Tumor removal     Left thigh     Prior to Admission medications   Medication Sig Start Date End Date Taking? Authorizing Provider  ALPRAZolam (XANAX) 0.25 MG tablet Take 0.25 mg by mouth 3 (three) times daily as needed. For anxiety or sleep 09/07/11  Yes Melissa S. Peggyann Juba, NP  bismuth  subsalicylate (PEPTO-BISMOL) 262 MG/15ML suspension Take 15 mLs by mouth as needed.    Yes Historical Provider, MD  nitroGLYCERIN (NITROSTAT) 0.4 MG SL tablet 0.4 mg every 5 (five) minutes as needed. For chest pain   Yes Historical Provider, MD  NON FORMULARY Take 1 scoop by mouth daily as needed. [name] FIBER  Mix 1 scoop in water daily.   Yes Historical Provider, MD  pantoprazole (PROTONIX) 40 MG tablet Take 40 mg by mouth daily as needed. For stomach pain 06/21/11 06/20/12 Yes Ala Dach Letitia Libra., MD  citalopram (CELEXA) 20 MG tablet Take 1 tablet (20 mg total) by mouth daily. 09/07/11 09/06/12  Melissa S. Peggyann Juba, NP  mometasone [facility]) 220 MCG/INH inhaler Inhale 2 puffs into the lungs daily. Patient says never used and will not 08/16/11 10/01/11  Shan Levans, MD    Social History:  reports that she quit smoking about 26 years ago. Her smoking use included Cigarettes. She has a 16 pack-year smoking history. She has never used smokeless tobacco. She reports that she drinks alcohol. She reports that she does not use illicit drugs.  Family History  Problem Relation Age of Onset  . Cancer Mother     renal cell carcinoma  . Diabetes Mother   . Heart disease Father     first MI in 68s  . Cancer Sister     cervical cancer  . Heart disease Brother   .  Alcohol abuse Sister   . Fibromyalgia Sister   . Arthritis Sister     osteoarthritis  . Irritable bowel syndrome Sister   . Leukemia      grandson    Review of Systems:  As per HPI and chief complaint. Patent feels fatigued, has gained about 20 pounds in the last few months, denies fever, chills, headache, has had blurred vision,  But no difficulty in speaking, dysphagia, cough, orthopnea, paroxysmal nocturnal dyspnea, abdominal pain, vomiting, diarrhea, hematemesis, melena, dysuria, nocturia, urinary frequency, hematochezia, lower extremity swelling, pain, or redness. The rest of the systems review is negative.  Physical  Exam:  General:  Patient does not appear to be in obvious acute distress. Alert, communicative, fully oriented, talking in complete sentences, not short of breath at rest., feels nauseated, and burped once during this evaluation. HEENT:  No clinical pallor, no jaundice, no conjunctival injection or discharge. Hydration status is fair. NECK:  Supple, JVP not seen, no carotid bruits, no palpable lymphadenopathy, no palpable goiter. CHEST:  Clinically clear to auscultation, no wheezes, no crackles. HEART:  Sounds 1 and 2 heard, normal, regular, no murmurs. ABDOMEN:  Full, soft, no scars, non-tender, no palpable organomegaly, no palpable masses, normal bowel sounds. GENITALIA:  Not examined. LOWER EXTREMITIES:  No pitting edema, palpable peripheral pulses. MUSCULOSKELETAL SYSTEM:  Unremarkable. CENTRAL NERVOUS SYSTEM:  No focal neurologic deficit on gross examination.  Labs on Admission:  Results for orders placed during the hospital encounter of 10/01/11 (from the past 48 hour(s))  CBC     Status: Normal   Collection Time   10/01/11  8:30 AM      Component Value Range Comment   WBC 5.5  4.0 - 10.5 (K/uL)    RBC 4.60  3.87 - 5.11 (MIL/uL)    Hemoglobin 14.0  12.0 - 15.0 (g/dL)    HCT 08.6  57.8 - 46.9 (%)    MCV 87.4  78.0 - 100.0 (fL)    MCH 30.4  26.0 - 34.0 (pg)    MCHC 34.8  30.0 - 36.0 (g/dL)    RDW 62.9  52.8 - 41.3 (%)    Platelets 306  150 - 400 (K/uL)   DIFFERENTIAL     Status: Normal   Collection Time   10/01/11  8:30 AM      Component Value Range Comment   Neutrophils Relative 77  43 - 77 (%)    Neutro Abs 4.2  1.7 - 7.7 (K/uL)    Lymphocytes Relative 17  12 - 46 (%)    Lymphs Abs 0.9  0.7 - 4.0 (K/uL)    Monocytes Relative 5  3 - 12 (%)    Monocytes Absolute 0.3  0.1 - 1.0 (K/uL)    Eosinophils Relative 0  0 - 5 (%)    Eosinophils Absolute 0.0  0.0 - 0.7 (K/uL)    Basophils Relative 1  0 - 1 (%)    Basophils Absolute 0.0  0.0 - 0.1 (K/uL)   CK TOTAL AND CKMB      Status: Normal   Collection Time   10/01/11  8:30 AM      Component Value Range Comment   Total CK 72  7 - 177 (U/L)    CK, MB 1.7  0.3 - 4.0 (ng/mL)    Relative Index RELATIVE INDEX IS INVALID  0.0 - 2.5    COMPREHENSIVE METABOLIC PANEL     Status: Abnormal   Collection Time   10/01/11  8:30  AM      Component Value Range Comment   Sodium 143  135 - 145 (mEq/L)    Potassium 4.3  3.5 - 5.1 (mEq/L)    Chloride 106  96 - 112 (mEq/L)    CO2 29  19 - 32 (mEq/L)    Glucose, Bld 128 (*) 70 - 99 (mg/dL)    BUN 13  6 - 23 (mg/dL)    Creatinine, Ser 1.61  0.50 - 1.10 (mg/dL)    Calcium 9.9  8.4 - 10.5 (mg/dL)    Total Protein 7.6  6.0 - 8.3 (g/dL)    Albumin 4.3  3.5 - 5.2 (g/dL)    AST 24  0 - 37 (U/L)    ALT 27  0 - 35 (U/L)    Alkaline Phosphatase 64  39 - 117 (U/L)    Total Bilirubin 0.3  0.3 - 1.2 (mg/dL)    GFR calc non Af Amer 80 (*) >90 (mL/min)    GFR calc Af Amer >90  >90 (mL/min)   TROPONIN I     Status: Normal   Collection Time   10/01/11  8:30 AM      Component Value Range Comment   Troponin I <0.30  <0.30 (ng/mL)   LIPASE, BLOOD     Status: Normal   Collection Time   10/01/11  8:30 AM      Component Value Range Comment   Lipase 33  11 - 59 (U/L)   D-DIMER, QUANTITATIVE     Status: Normal   Collection Time   10/01/11  8:30 AM      Component Value Range Comment   D-Dimer, Quant 0.22  0.00 - 0.48 (ug/mL-FEU)   PROTIME-INR     Status: Normal   Collection Time   10/01/11  8:30 AM      Component Value Range Comment   Prothrombin Time 12.8  11.6 - 15.2 (seconds)    INR 0.94  0.00 - 1.49      Radiological Exams on Admission: *RADIOLOGY REPORT*  Clinical Data: 61 year old female with chest pain.  PORTABLE CHEST - 1 VIEW  Comparison: 06/07/2011 and earlier.  Findings: Portable AP view 0842 hours. Lung volumes are stable and within normal limits. Normal cardiac size and mediastinal contours. Visualized tracheal air column is within normal limits. Allowing for portable  technique, the lungs are clear. No pneumothorax or effusion.  IMPRESSION: Negative, no acute cardiopulmonary abnormality.  Original Report Authenticated By: Harley Hallmark, M.D.        Assessment/Plan Principal Problem:  *Chest pain: This is the main presenting problem. Cardiac enzymes are negative so far, EKG shows a partial RBBB, but is otherwise, devoid of acute ischemic changes. D-Dimer is normal at 0.22, and CXR is devoid of acute disease. I suspect this is atypical/non-cardiac pain, and more consistent with a GI etiology, given symptoms of nausea, burping, location, and known history IBS, but patient has a positive family history of early CAD, and has dyslipidemia. Stratification is indicated, so will consult [facility]. Meanwhile, continue cycling cardiac enzymes, and monitor telemetrically. Nitrates/Morhinewill be utilized for on-going pain, and low dose beta-blocker, as patient was initially mildly tachycardic. Active Problems:  1. Dyslipidemia: Will check lipid profile and TSH  2. Anxiety: Patient may have some functional overlay, given known history of anxiety. Will utilize prn anxiolytics.  Time Spent on Admission: 45 mins.  Deloras Reichard,CHRISTOPHER 10/01/2011, 12:13 PM

## 2011-10-01 NOTE — ED Provider Notes (Addendum)
History     CSN: [id number]  Arrival date & time 10/01/11  0804   First MD Initiated Contact with Patient 10/01/11 [phone]      Chief Complaint  Patient presents with  . Chest Pain    chest pressure right chest radiating into back    (Consider location/radiation/quality/duration/timing/severity/associated sxs/prior treatment) HPI Patient with sscp began yesterday about 10 a.m.with radiation to bilateral shoulder and right chest pressure.  Some dyspnea and diaphoresis, and nausea.  No vomiting.  Patient has had some chest pain in past but states this is different.  She denies that she had follow up but states she has nitro but has not taken it.  She says she got the nitro here for chest pain.   Past Medical History  Diagnosis Date  . Anxiety   . Hyperlipidemia   . Diverticulosis   . IBS (irritable bowel syndrome)   . Depression     Past Surgical History  Procedure Date  . Abdominal hysterectomy   . Tubal ligation   . Hemorrhoid surgery   . Tumor removal     Left thigh     Family History  Problem Relation Age of Onset  . Cancer Mother     renal cell carcinoma  . Diabetes Mother   . Heart disease Father     first MI in 35s  . Cancer Sister     cervical cancer  . Heart disease Brother   . Alcohol abuse Sister   . Fibromyalgia Sister   . Arthritis Sister     osteoarthritis  . Irritable bowel syndrome Sister   . Leukemia      grandson    History  Substance Use Topics  . Smoking status: Former Smoker -- 0.8 packs/day for 20 years    Types: Cigarettes    Quit date: 08/13/1985  . Smokeless tobacco: Never Used  . Alcohol Use: Yes     rare    OB History    Grav Para Term Preterm Abortions TAB SAB Ect Mult Living                  Review of Systems  All other systems reviewed and are negative.    Allergies  Review of patient's allergies indicates no known allergies.  Home Medications   Current Outpatient Rx  Name Route Sig Dispense Refill  .  ALPRAZOLAM 0.25 MG PO TABS Oral Take 1 tablet (0.25 mg total) by mouth at bedtime as needed. For anxiety or sleep 30 tablet 0  . BISMUTH SUBSALICYLATE 262 MG/15ML PO SUSP Oral Take 15 mLs by mouth as needed.      Marland Kitchen CITALOPRAM HYDROBROMIDE 20 MG PO TABS Oral Take 1 tablet (20 mg total) by mouth daily. 30 tablet 3  . MOMETASONE FUROATE 220 MCG/INH IN AEPB Inhalation Inhale 2 puffs into the lungs daily. 1 Inhaler 6  . NITROGLYCERIN 0.4 MG SL SUBL      . NON FORMULARY Oral Take 1 scoop by mouth daily. [name] FIBER  Mix 1 scoop in water daily.     . NON FORMULARY Oral Take 1 tablet by mouth daily. MEMORY FACTOR      . WELLNESS ESSENTIALS FOR WOMEN PO Oral Take 1 capsule by mouth daily.      Marland Kitchen PANTOPRAZOLE SODIUM 40 MG PO TBEC Oral Take 1 tablet (40 mg total) by mouth daily. 30 tablet 2    BP 146/85  Pulse 101  Temp(Src) 98.6 F (37 C) (Oral)  Resp 18  SpO2 100%  Physical Exam  Nursing note and vitals reviewed. Constitutional: She appears well-developed and well-nourished.  Cardiovascular: Tachycardia present.   Pulmonary/Chest: Effort normal.  Abdominal: Soft.  Musculoskeletal: Normal range of motion.  Neurological: She is alert.  Skin: Skin is warm.  Psychiatric: She has a normal mood and affect.    ED Course  Procedures (including critical care time)   Labs Reviewed  CBC  DIFFERENTIAL  CK TOTAL AND CKMB  COMPREHENSIVE METABOLIC PANEL  TROPONIN I   No results found.   No diagnosis found.  Date: 10/01/2011  Rate: 104  Rhythm: sinus tachycardia  QRS Axis: normal  Intervals: normal  ST/T Wave abnormalities: nonspecific ST/T changes  Conduction Disutrbances:none  Narrative Interpretation:   Old EKG Reviewed: changes noted     MDM  Pain decreased from 8 to 5 after nitro sl x 2.   Patient receiving iv dilaudid.  Discussed with Dr. Brien Few and plan transfer to [facility] for mi r.o.  Patient will go to step down bed.         Hilario Quarry, MD 10/01/11  1610  Hilario Quarry, MD 10/26/11 1544  Hilario Quarry, MD 11/01/11 1450

## 2011-10-01 NOTE — Progress Notes (Signed)
Utilization Review Completed.Jamie [name] T2/18/2013   

## 2011-10-01 NOTE — ED Notes (Signed)
Patient states she developed mid chest pain with pressure in her right chest radiating into back started yesterday at 10am.  Symptoms are associated with nausea and shortness of breath.

## 2011-10-01 NOTE — Consult Note (Signed)
Cardiology Consult Note   Patient ID: Jamie [name] MRN: [id number], DOB/AGE: Jan 07, 1951   Admit date: 10/01/2011 Date of Consult: 10/01/2011  Primary Physician: Lemont Fillers., NP, NP Primary Cardiologist: New to cardiology  Pt. Profile: Jamie [name] is a 61yo female with PMHx significant for hyperlipidemia and premature family hx of CAD, and anxiety who was admitted to [facility] ED with chest pain.   Past Cardiac Studies  A 2-D echocardiogram on 07-18-2010: EF 55-60% with grade 1 diastolic relaxation abnormality. Normal valves.    Exercise Myoview 07/15/10 which was normal: patient walked for 6 minutes and 1 second to a maximal heart rate of 144.  She did have chest pain and dyspnea with exertion, but no acute ST or T changes.  EF was 73% without evidence of ischemia.   A cardiac CT was performed in 07/2010 revealing: Calcium Score: 0. Coronary Arteries: Right dominant. No anomalies. LM short segment and small caliber normal. LAD normal, Very small IM normal D1 large and branching normal Circumflex normal, OM1, OM2 normal. RCA normal.   Reason for consult: evaluation of chest pain  Problem List: Past Medical History  Diagnosis Date  . Anxiety   . Hyperlipidemia   . Diverticulosis   . IBS (irritable bowel syndrome)   . Depression     Past Surgical History  Procedure Date  . Abdominal hysterectomy   . Tubal ligation   . Hemorrhoid surgery   . Tumor removal     Left thigh     Allergies:  Allergies  Allergen Reactions  . Shellfish Allergy Swelling   HPI:   She states that her chest pain began yesterday morning at 10am located across her lower chest/epigastrum and radiating to her back and right side of the neck, described as intermittent pressure lasting for only 1 minute or so, rated at a 5-6/10. She reports associated sob, nausea, lightheadedness, diaphoresis, vision changes, weakness and presyncope. She denies fevers, chills, cough and PND. She has nitroglycerin at home, but  did not attempt to take this to relieve her symptoms. Alleviating factors include position changes. Aggravated by laying flat. She does not recall any aggravating event eliciting this pain. She reports visiting a friend's son in prison, which she felt to be particularly anxiety provoking. Her symptoms began then, and progressively worsened throughout the day and into the following morning. Denies sick contacts. She reports extended travel- 5 to 6 hours in a car.   She recently saw her PCP last month and was diagnosed with strep throat. She recently completed a 3-day course of amoxicillin.  She does endorse episodes of indigestion recently with associated belching more frequently.   Home Medications: Prior to Admission medications   Medication Sig Start Date End Date Taking? Authorizing Provider  ALPRAZolam (XANAX) 0.25 MG tablet Take 0.25 mg by mouth 3 (three) times daily as needed. For anxiety or sleep 09/07/11  Yes Melissa S. Peggyann Juba, NP  bismuth subsalicylate (PEPTO-BISMOL) 262 MG/15ML suspension Take 15 mLs by mouth as needed.    Yes Historical Provider, MD  nitroGLYCERIN (NITROSTAT) 0.4 MG SL tablet 0.4 mg every 5 (five) minutes as needed. For chest pain   Yes Historical Provider, MD  NON FORMULARY Take 1 scoop by mouth daily as needed. [name] FIBER  Mix 1 scoop in water daily.   Yes Historical Provider, MD  pantoprazole (PROTONIX) 40 MG tablet Take 40 mg by mouth daily as needed. For stomach pain 06/21/11 06/20/12 Yes Ala Dach Letitia Libra., MD  citalopram (  CELEXA) 20 MG tablet Take 1 tablet (20 mg total) by mouth daily. 09/07/11 09/06/12  Melissa S. Peggyann Juba, NP  mometasone [facility]) 220 MCG/INH inhaler Inhale 2 puffs into the lungs daily. Patient says never used and will not 08/16/11 10/01/11  Shan Levans, MD   Inpatient Medications:     . aspirin  324 mg Oral Once  . aspirin  325 mg Oral Daily  . fluticasone  2 puff Inhalation BID  . heparin  5,000 Units Subcutaneous Q8H    .  HYDROmorphone (DILAUDID) injection  1 mg Intravenous Once  . metoprolol tartrate  12.5 mg Oral BID  . ondansetron  4 mg Intravenous Once  . pantoprazole  40 mg Oral BID AC  . sodium chloride  3 mL Intravenous Q12H  . sodium chloride  3 mL Intravenous Q12H   Prescriptions prior to admission  Medication Sig Dispense Refill  . ALPRAZolam (XANAX) 0.25 MG tablet Take 0.25 mg by mouth 3 (three) times daily as needed. For anxiety or sleep      . bismuth subsalicylate (PEPTO-BISMOL) 262 MG/15ML suspension Take 15 mLs by mouth as needed.       . nitroGLYCERIN (NITROSTAT) 0.4 MG SL tablet 0.4 mg every 5 (five) minutes as needed. For chest pain      . NON FORMULARY Take 1 scoop by mouth daily as needed. [name] FIBER  Mix 1 scoop in water daily.      . pantoprazole (PROTONIX) 40 MG tablet Take 40 mg by mouth daily as needed. For stomach pain      . citalopram (CELEXA) 20 MG tablet Take 1 tablet (20 mg total) by mouth daily.  30 tablet  3  . mometasone (ASMANEX) 220 MCG/INH inhaler Inhale 2 puffs into the lungs daily. Patient says never used and will not        Family History  Problem Relation Age of Onset  . Cancer Mother     renal cell carcinoma  . Diabetes Mother   . Heart disease Father     first MI in 67s  . Cancer Sister     cervical cancer  . Heart disease Brother   . Alcohol abuse Sister   . Fibromyalgia Sister   . Arthritis Sister     osteoarthritis  . Irritable bowel syndrome Sister   . Leukemia      grandson    History   Social History  . Marital Status: Married    Spouse Name: N/A    Number of Children: 3  . Years of Education: N/A   Occupational History  . Student    Social History Main Topics  . Smoking status: Former Smoker -- 0.8 packs/day for 20 years    Types: Cigarettes    Quit date: 08/13/1985  . Smokeless tobacco: Never Used  . Alcohol Use: Yes     rare  . Drug Use: No  . Sexually Active: Not on file   Other Topics Concern  . Not on file    Social History Narrative   Lives with husband and grandchild. School full time for paralegalCaffeine use; occasional    Review of Systems: General: negative for chills, fever, night sweats or weight changes.  Cardiovascular: positive for chest pain, dyspnea on exertion, orthopnea, palpitations, paroxysmal nocturnal dyspnea or shortness of breath. Negative for edema Dermatological: negative for rash Respiratory: negative for cough or wheezing Urologic: negative for hematuria Abdominal: positive for nausea, negative for vomiting, diarrhea, bright red blood per rectum, melena,  or hematemesis Neurologic: positive for visual changes, dizziness, negative for syncope All other systems reviewed and are otherwise negative except as noted above.  Physical Exam: Blood pressure 100/50, pulse 68, temperature 98.6 F (37 C), temperature source Oral, resp. rate 13, height 5\' 5"  (1.651 m), weight 78.563 kg (173 lb 3.2 oz), SpO2 100.00%.    General: Well developed, well nourished, in no acute distress. Head: Normocephalic, atraumatic, sclera non-icteric, no xanthomas, nares are without discharge.  Neck: Negative for carotid bruits. JVD not elevated. Lungs: Clear bilaterally to auscultation without wheezes, rales, or rhonchi. Breathing is unlabored. Heart: RRR with S1 S2. No murmurs, rubs, or gallops appreciated. Abdomen: Soft, non-tender, non-distended with normoactive bowel sounds. No hepatomegaly. No rebound/guarding. No obvious abdominal masses. Msk:  Strength and tone appears normal for age. No chest Virgal Warmuth tenderness.  Extremities: No clubbing, cyanosis or edema.  Distal pedal pulses are 2+ and equal bilaterally. Neuro: Alert and oriented X 3. Moves all extremities spontaneously. Psych:  Responds to questions appropriately with a normal affect.  Labs: Recent Labs  Basename 10/01/11 0830   WBC 5.5   HGB 14.0   HCT 40.2   MCV 87.4   PLT 306   Recent Labs  Basename 10/01/11 0830   DDIMER  0.22    Lab 10/01/11 0830  NA 143  K 4.3  CL 106  CO2 29  BUN 13  CREATININE 0.80  CALCIUM 9.9  PROT 7.6  BILITOT 0.3  ALKPHOS 64  ALT 27  AST 24  AMYLASE --  LIPASE 33  GLUCOSE 128*    Recent Labs  Basename 10/01/11 0830   CKTOTAL 72   CKMB 1.7   CKMBINDEX --   TROPONINI <0.30   Radiology/Studies: Dg Chest Port 1 View  10/01/2011  *RADIOLOGY REPORT*  Clinical Data: 61 year old female with chest pain.  PORTABLE CHEST - 1 VIEW  Comparison: 06/07/2011 and earlier.  Findings: Portable AP view 0842 hours.  Lung volumes are stable and within normal limits. Normal cardiac size and mediastinal contours. Visualized tracheal air column is within normal limits.  Allowing for portable technique, the lungs are clear.  No pneumothorax or effusion.  IMPRESSION: Negative, no acute cardiopulmonary abnormality.  Original Report Authenticated By: Ulla Potash III, M.D.    EKG: sinus tachycardia, 104 bpm, RBBB, no ST-T wave changes  ASSESSMENT AND PLAN:   1. Chest pain- atypical for a cardiac source. Her chest/epigastric discomfort and associated symptoms are episodic lasting approximately 1 minute at a time. They alleviated by position changes. She states this occurred after a described panic attack while visiting a friend's son in prison. She denies exertional chest pain or gradual worsening of her symptoms over the last few weeks. She does note to have been experiencing indigestion more frequently. No chest Wanisha Shiroma tenderness on exam. No murmur auscultated. EKG reveals sinus tachycardia with possible RBBB. She does note extended travel recently, however D-dimer WNL. CEs neg x 1. CXR without evidence of acute cardiopulmonary process. Aside from a mild sinus tachycardia on admission, VS have been stable. The patient does have cardiac risk factors in hyperlipemia and premature family hx of CAD. She had an extensive cardiac work-up a little over a year ago revealing mild diastolic dysfunction on echo,  normal EF, normal stress without evidence of ischemia and normal cardiac CT in Dec. 2011. Low-suspicion this is anginal pain. The patient's pain may represent reflux symptoms. Additionally, she has been noted to have anxiety and dysthymic disorder by PCP which certainly may  elicit or contribute to her symptoms.   - Continue to cycle cardiac biomarkers for ACS rule out  - Continue BB   Signed, R. Hurman Horn, PA-C 10/01/2011, 2:07 PM   I have taken a history, reviewed medications, allergies, PMH, SH, FH, and reviewed ROS and examined the patient.  I agree with the assessment and plan  This is clearly not cardiac by history, exam and previous objective assessments including Cardiac CT.  Javonte Elenes C. Daleen Squibb, MD, [facility] Pager:  [phone]

## 2011-10-02 DIAGNOSIS — R079 Chest pain, unspecified: Secondary | ICD-10-CM

## 2011-10-02 LAB — CARDIAC PANEL(CRET KIN+CKTOT+MB+TROPI)
CK, MB: 1.5 ng/mL (ref 0.3–4.0)
Relative Index: INVALID (ref 0.0–2.5)
Relative Index: INVALID (ref 0.0–2.5)
Total CK: 50 U/L (ref 7–177)

## 2011-10-02 LAB — BASIC METABOLIC PANEL
CO2: 28 mEq/L (ref 19–32)
Chloride: 105 mEq/L (ref 96–112)
Creatinine, Ser: 0.83 mg/dL (ref 0.50–1.10)
Glucose, Bld: 100 mg/dL — ABNORMAL HIGH (ref 70–99)

## 2011-10-02 MED ORDER — ALPRAZOLAM 0.25 MG PO TABS: 0.2500 mg | ORAL_TABLET | Freq: Three times a day (TID) | ORAL | Status: DC | PRN

## 2011-10-02 MED ORDER — ASPIRIN 81 MG PO TBEC: 81.0000 mg | DELAYED_RELEASE_TABLET | Freq: Every day | ORAL | Status: DC

## 2011-10-02 MED ORDER — METOPROLOL TARTRATE 12.5 MG HALF TABLET: 12.5000 mg | ORAL_TABLET | Freq: Two times a day (BID) | ORAL | Status: DC

## 2011-10-02 MED ORDER — PANTOPRAZOLE SODIUM 40 MG PO TBEC: 40.0000 mg | DELAYED_RELEASE_TABLET | Freq: Two times a day (BID) | ORAL | Status: DC

## 2011-10-02 NOTE — Discharge Summary (Signed)
Physician Discharge Summary  Patient ID: Jamie [name] MRN: [id number] DOB/AGE: February 06, 1951 61 y.o.  Admit date: 10/01/2011 Discharge date: 10/02/2011  Primary Care Physician:  Lemont Fillers., NP, NP   Discharge Diagnoses:    Patient Active Problem List  Diagnoses  . ONYCHOMYCOSIS, TOENAILS  . ANXIETY DEPRESSION  . Tinea versicolor  . Sinusitis  . Hyperlipidemia  . Diverticulosis of colon (without mention of hemorrhage)  . Extrinsic asthma, unspecified  . Strep throat  . Chest pain  . Dyslipidemia  . Anxiety    Medication List  As of 10/02/2011  1:38 PM   STOP taking these medications         mometasone 220 MCG/INH inhaler         TAKE these medications         ALPRAZolam 0.25 MG tablet   Commonly known as: XANAX   Take 1 tablet (0.25 mg total) by mouth 3 (three) times daily as needed. For anxiety or sleep      aspirin 81 MG EC tablet   Take 1 tablet (81 mg total) by mouth daily. Swallow whole.      citalopram 20 MG tablet   Commonly known as: CELEXA   Take 1 tablet (20 mg total) by mouth daily.      metoprolol tartrate 12.5 mg Tabs   Commonly known as: LOPRESSOR   Take 0.5 tablets (12.5 mg total) by mouth 2 (two) times daily.      nitroGLYCERIN 0.4 MG SL tablet   Commonly known as: NITROSTAT   0.4 mg every 5 (five) minutes as needed. For chest pain      NON FORMULARY   Take 1 scoop by mouth daily as needed. Jamie [name] FIBER  Mix 1 scoop in water daily.      pantoprazole 40 MG tablet   Commonly known as: PROTONIX   Take 1 tablet (40 mg total) by mouth 2 (two) times daily before a meal.      PEPTO-BISMOL 262 MG/15ML suspension   Generic drug: bismuth subsalicylate   Take 15 mLs by mouth as needed.             Disposition and Follow-up:  Follow up with primary MD.  Consults:  cardiology  Dr Valera Castle, Cardiologist.  Significant Diagnostic Studies:  Dg Chest Port 1 View  10/01/2011  *RADIOLOGY REPORT*  Clinical Data: 61 year old  female with chest pain.  PORTABLE CHEST - 1 VIEW  Comparison: 06/07/2011 and earlier.  Findings: Portable AP view 0842 hours.  Lung volumes are stable and within normal limits. Normal cardiac size and mediastinal contours. Visualized tracheal air column is within normal limits.  Allowing for portable technique, the lungs are clear.  No pneumothorax or effusion.  IMPRESSION: Negative, no acute cardiopulmonary abnormality.  Original Report Authenticated By: Jamie [name], M.D.    Brief H and P: For complete details, refer to admission H and P. However, in brief, this is a 61 year old year female, with known history of anxiety, depression, dyslipidemia, IBS, diverticulosis, s/p hemorrhoidectomy, bilateral tubal ligation and hysterectomy. Hospitalized in 07/1010 for chest pain and underwent stress myoview by Dr Fortino Sic on 07/15/10, which showed no evidence of ischemia,and an EF of 73%. Has experienced chest pain for the past 2 days. According to patient, she went to visit a friend's son in jail on 09/30/11 AM, and spent about 2 hours there. She felt quite "traumatized" by the experience, and did not feel quite right afterwards. She got home  and lay on the cough, feeling quite drained of energy, and at about 3:30 PM, she felt lower sternal /epigastric "pressure", which felt like a band across her lower chest and radiated upward into the right side of her neck. According to her, this has remained constant, associated with diaphoresis, nausea, and initially, with shortness of breath. At about 9:00 PM, she went to bed, fell asleep, but awoke at 2:00 AM on 10/01/11, with the same symptoms. She was unable to go back to sleep, and at 7:00 AM, she went to the ED at [facility], from where she was sent as a direct admission, to [facility].   Physical Exam: On 10/02/11. General: Feels much better today, asymptomatic, cheerful. Alert, communicative, fully oriented, talking in complete sentences, not short  of breath at rest.  HEENT: No clinical pallor, no jaundice, no conjunctival injection or discharge. Hydration status is fair.  NECK: Supple, JVP not seen, no carotid bruits, no palpable lymphadenopathy, no palpable goiter.  CHEST: Clinically clear to auscultation, no wheezes, no crackles.  HEART: Sounds 1 and 2 heard, normal, regular, no murmurs.  ABDOMEN: Full, soft, no scars, non-tender, no palpable organomegaly, no palpable masses, normal bowel sounds.  GENITALIA: Not examined.  LOWER EXTREMITIES: No pitting edema, palpable peripheral pulses.  MUSCULOSKELETAL SYSTEM: Unremarkable.  CENTRAL NERVOUS SYSTEM: No focal neurologic deficit on gross examination.  Hospital Course:  Principal Problem:  *Chest pain: This was the main presenting problem. Cardiac enzymes remained un-elevated, EKG showed a partial RBBB, but wass otherwise, devoid of acute ischemic changes. D-Dimer was normal at 0.22, and CXR is devoid of acute disease. Chest pain is considered atypical/non-cardiac pain, and more consistent with a GI etiology, given symptoms of nausea, burping, location, and known history IBS. Given patient's strong family history of early CAD, and personal history of dyslipidemia, [facility] was consulted, and consultation was provided by Dr Valera Castle, and patient was subsequently seen by Dr Sanjuana Kava. The cardiology team opined that this patient has had an extensive cardiac workup within the last 14 months, including coronary CTA which showed no evidence of CAD, calcium score of zero. She has had a normal echo and a normal stress myoview with no evidence of ischemia. This pain is most likely non-cardiac. No further cardiac workup is indicated at this time. Patient has been reassured accordingly. She was commenced on low dose ASA and Beta-blocker. And was asymptomatic as of 10/02/11. Protonix has been changed to B.I.D. Active Problems:  1. Dyslipidemia: TSH was normal at 1.791. Lipid profile was done,  but report was still pending at the time of these notes. 2. Anxiety: Patient may have some functional overlay, given known history of anxiety. She was managed with anxiolytics, to good effect.   Comment: patient is considered stable for discharge on 10/02/11.    Time spent on Discharge: 45 mins.  Signed: Leith Hedlund,CHRISTOPHER 10/02/2011, 1:38 PM

## 2011-10-02 NOTE — Progress Notes (Signed)
2603-[facility]. D'c IV, tele. Vitals signs remain stable. Discharge instructions discussed with patient and patient verbalized understanding. Pt discharged to home via wheelchair with family and belongings at side. Jamie [name]

## 2011-10-02 NOTE — Progress Notes (Signed)
    SUBJECTIVE: Pain across chest is still present but much better. No SOB.   BP 120/59  Pulse 77  Temp(Src) 97.4 F (36.3 C) (Oral)  Resp 11  Ht 5\' 5"  (1.651 m)  Wt 174 lb 13.2 oz (79.3 kg)  BMI 29.09 kg/m2  SpO2 99%  Intake/Output Summary (Last 24 hours) at 10/02/11 0842 Last data filed at 10/02/11 0741  Gross per 24 hour  Intake   1480 ml  Output    750 ml  Net    730 ml    PHYSICAL EXAM General: Well developed, well nourished, in no acute distress. Alert and oriented x 3.  Psych:  Good affect, responds appropriately Neck: No JVD. No masses noted.  Lungs: Clear bilaterally with no wheezes or rhonci noted.  Heart: RRR with no murmurs noted. Abdomen: Bowel sounds are present. Soft, non-tender.  Extremities: No lower extremity edema.   LABS: Basic Metabolic Panel:  Basename 10/02/11 0207 10/01/11 1340 10/01/11 0830  NA 140 -- 143  K 3.5 -- 4.3  CL 105 -- 106  CO2 28 -- 29  GLUCOSE 100* -- 128*  BUN 15 -- 13  CREATININE 0.83 0.68 --  CALCIUM 9.1 -- 9.9  MG -- -- --  PHOS -- -- --   CBC:  Basename 10/01/11 1340 10/01/11 0830  WBC 5.5 5.5  NEUTROABS -- 4.2  HGB 12.4 14.0  HCT 36.1 40.2  MCV 89.1 87.4  PLT 269 306   Cardiac Enzymes:  Basename 10/02/11 0207 10/01/11 1931 10/01/11 0830  CKTOTAL 50 56 72  CKMB 1.5 1.6 1.7  CKMBINDEX -- -- --  TROPONINI <0.30 <0.30 <0.30    Current Meds:    . aspirin  325 mg Oral Daily  . fluticasone  2 puff Inhalation BID  . heparin  5,000 Units Subcutaneous Q8H  .  HYDROmorphone (DILAUDID) injection  1 mg Intravenous Once  . metoprolol tartrate  12.5 mg Oral BID  . ondansetron  4 mg Intravenous Once  . pantoprazole  40 mg Oral BID AC  . sodium chloride  3 mL Intravenous Q12H  . sodium chloride  3 mL Intravenous Q12H     ASSESSMENT AND PLAN:  1. Chest pain: Cardiac enzymes negative. Chest pain is atypical. This patient has had an extensive cardiac workup within the last 14 months  including coronary CTA which  showed no evidence of CAD, calcium score of zero. She has had a normal echo and a normal stress myoview with no evidence of ischemia. This pain is most likely non-cardiac. No further cardiac workup is indicated at this time. I suspect this is driven by her anxiety and psychological issues.    Jamie [name]  2/19/20138:42 AM

## 2011-10-02 NOTE — Progress Notes (Signed)
Utilization Review Completed.Jamie [name] T2/19/2013   

## 2011-10-17 ENCOUNTER — Ambulatory Visit: Payer: Self-pay | Admitting: Family

## 2011-10-17 DIAGNOSIS — Z0289 Encounter for other administrative examinations: Secondary | ICD-10-CM

## 2011-11-06 ENCOUNTER — Encounter: Payer: Self-pay | Admitting: Family

## 2011-11-06 ENCOUNTER — Ambulatory Visit (INDEPENDENT_AMBULATORY_CARE_PROVIDER_SITE_OTHER): Payer: Self-pay | Admitting: Family

## 2011-11-06 DIAGNOSIS — J3089 Other allergic rhinitis: Secondary | ICD-10-CM

## 2011-11-06 DIAGNOSIS — Z0000 Encounter for general adult medical examination without abnormal findings: Secondary | ICD-10-CM

## 2011-11-06 DIAGNOSIS — J302 Other seasonal allergic rhinitis: Secondary | ICD-10-CM | POA: Insufficient documentation

## 2011-11-06 DIAGNOSIS — J309 Allergic rhinitis, unspecified: Secondary | ICD-10-CM

## 2011-11-06 DIAGNOSIS — J029 Acute pharyngitis, unspecified: Secondary | ICD-10-CM | POA: Insufficient documentation

## 2011-11-06 HISTORY — DX: Other seasonal allergic rhinitis: J30.2

## 2011-11-06 HISTORY — DX: Other seasonal allergic rhinitis: J30.89

## 2011-11-06 LAB — POCT RAPID STREP A (OFFICE): Rapid Strep A Screen: NEGATIVE

## 2011-11-06 MED ORDER — PENICILLIN V POTASSIUM 500 MG PO TABS: 500.0000 mg | ORAL_TABLET | Freq: Three times a day (TID) | ORAL | Status: AC

## 2011-11-06 NOTE — Assessment & Plan Note (Signed)
Rapid strep is neg, but she has exudates and looks sick today. Previous hx of strep.  Will plan empiric treatment with penicillin.

## 2011-11-06 NOTE — Patient Instructions (Signed)
Please schedule your mammogram on the first floor today. Call if your symptoms worsen, or if you are not feeling better in 2-3 days. You will be contacted about your referral to the allergist.

## 2011-11-06 NOTE — Assessment & Plan Note (Signed)
She is requesting referral to an allergist for further evaluation.  Will refer.

## 2011-11-06 NOTE — Progress Notes (Signed)
  Subjective:    Patient ID: Jamie [name], female    DOB: March 16, 1951, 61 y.o.   MRN: [id number]  HPI  [name] is a 61 yr old female who presents today with chief complaint of sore throat  She reports that her sore throat started 5 days ago and is associated with nausea, and clear nasal drainage.  She denies associated fever but has felt hot.   Review of Systems See HPI    Objective:   Physical Exam  Constitutional: She appears well-developed and well-nourished.  HENT:  Right Ear: Tympanic membrane and ear canal normal.  Left Ear: Tympanic membrane and ear canal normal.  Mouth/Throat: Oropharyngeal exudate present. No posterior oropharyngeal edema or posterior oropharyngeal erythema.  Cardiovascular: Normal rate and regular rhythm.   No murmur heard. Pulmonary/Chest: Effort normal and breath sounds normal. No respiratory distress. She has no wheezes. She has no rales. She exhibits no tenderness.  Lymphadenopathy:    She has no cervical adenopathy.  Psychiatric: She has a normal mood and affect. Her behavior is normal. Judgment and thought content normal.          Assessment & Plan:

## 2011-11-08 ENCOUNTER — Other Ambulatory Visit: Payer: Self-pay | Admitting: Family

## 2011-11-08 DIAGNOSIS — Z1231 Encounter for screening mammogram for malignant neoplasm of breast: Secondary | ICD-10-CM

## 2011-11-09 ENCOUNTER — Ambulatory Visit (HOSPITAL_BASED_OUTPATIENT_CLINIC_OR_DEPARTMENT_OTHER)
Admission: RE | Admit: 2011-11-09 | Discharge: 2011-11-09 | Disposition: A | Discharge status: Home / Self Care | Payer: Self-pay | Source: Ambulatory Visit | Attending: Family | Admitting: Family

## 2011-11-09 ENCOUNTER — Inpatient Hospital Stay (HOSPITAL_BASED_OUTPATIENT_CLINIC_OR_DEPARTMENT_OTHER): Admission: RE | Admit: 2011-11-09 | Payer: Self-pay | Source: Ambulatory Visit

## 2011-11-09 DIAGNOSIS — Z1231 Encounter for screening mammogram for malignant neoplasm of breast: Secondary | ICD-10-CM | POA: Insufficient documentation

## 2011-11-13 ENCOUNTER — Other Ambulatory Visit: Payer: Self-pay | Admitting: Family

## 2011-11-13 DIAGNOSIS — R928 Other abnormal and inconclusive findings on diagnostic imaging of breast: Secondary | ICD-10-CM

## 2011-11-16 ENCOUNTER — Ambulatory Visit
Admission: RE | Admit: 2011-11-16 | Discharge: 2011-11-16 | Disposition: A | Discharge status: Home / Self Care | Payer: Self-pay | Source: Ambulatory Visit | Attending: Family | Admitting: Family

## 2011-11-16 DIAGNOSIS — R928 Other abnormal and inconclusive findings on diagnostic imaging of breast: Secondary | ICD-10-CM

## 2011-12-20 ENCOUNTER — Encounter: Payer: Self-pay | Admitting: Internal Medicine

## 2011-12-20 ENCOUNTER — Other Ambulatory Visit: Payer: Self-pay

## 2011-12-20 ENCOUNTER — Ambulatory Visit (INDEPENDENT_AMBULATORY_CARE_PROVIDER_SITE_OTHER): Payer: Self-pay | Admitting: Internal Medicine

## 2011-12-20 VITALS — BP 120/74 | HR 102 | Ht 65.0 in | Wt 171.8 lb

## 2011-12-20 DIAGNOSIS — Z91018 Allergy to other foods: Secondary | ICD-10-CM

## 2011-12-20 DIAGNOSIS — B360 Pityriasis versicolor: Secondary | ICD-10-CM

## 2011-12-20 DIAGNOSIS — R21 Rash and other nonspecific skin eruption: Secondary | ICD-10-CM

## 2011-12-20 DIAGNOSIS — T781XXA Other adverse food reactions, not elsewhere classified, initial encounter: Secondary | ICD-10-CM

## 2011-12-20 NOTE — Patient Instructions (Signed)
Order- lab-  Food IgE allergy profile   Dx food allergy  Schedule return for allergy skin tests.  Since antihistamines directly block the tests- please for 3 days before the tests- no antihistamines, otc cough, cold remedies,  otc sleep meds.   Meanwhile, see if you benefit from an otc antihistamine like Claritin/ loratadine or Allegra/ fexofenadine.

## 2011-12-20 NOTE — Progress Notes (Signed)
12/20/11- 41 yoF  Former smoker, referred by Dr Delford Field for allergy evaluation  She complains of rash especially when she eats seafood, over the past 18 months. She has generalized itching 2 days with rash she describes as large clusters of raised red areas. She gets relief from bland lotions and Benadryl. She suspects seafood, especially shrimp was first onset of this rash was noted within a few hours of eating a shrimp dish. She denies history of seasonal allergic rhinitis. She avoids irritants like tobacco smoke but has no diagnosis of asthma. Milk products seem to increase mucus in her throat and make her cough. She has seen Dr. Delford Field for dyspnea which has improved. Mold was identified in her attic, and remediation is being planned. Allergy profile 08/30/2011 was negative with total IgE 40.6.  Prior to Admission medications   Medication Sig Start Date End Date Taking? Authorizing Provider  ALPRAZolam (XANAX) 0.25 MG tablet Take 1 tablet (0.25 mg total) by mouth 3 (three) times daily as needed. For anxiety or sleep 10/02/11  Yes Laveda Norman, MD  bismuth subsalicylate (PEPTO-BISMOL) 262 MG/15ML suspension Take 15 mLs by mouth as needed.    Yes Historical Provider, MD  nitroGLYCERIN (NITROSTAT) 0.4 MG SL tablet 0.4 mg every 5 (five) minutes as needed. For chest pain   Yes Historical Provider, MD  NON FORMULARY Take 1 scoop by mouth daily as needed. [name] FIBER  Mix 1 scoop in water daily.   Yes Historical Provider, MD  pantoprazole (PROTONIX) 40 MG tablet Take 1 tablet (40 mg total) by mouth 2 (two) times daily before a meal. 10/02/11 10/01/12 Yes Laveda Norman, MD   Earl Lagos Past Surgical History  Procedure Date  . Abdominal hysterectomy   . Tubal ligation   . Hemorrhoid surgery   . Tumor removal     Left thigh    Family History  Problem Relation Age of Onset  . Cancer Mother     renal cell carcinoma  . Diabetes Mother   . Heart disease Father     first MI in 4s  . Cancer Sister    cervical cancer  . Heart disease Brother     first MI in 71s  . Alcohol abuse Sister   . Fibromyalgia Sister   . Arthritis Sister     osteoarthritis  . Irritable bowel syndrome Sister   . Leukemia      grandson   History   Social History  . Marital Status: Married    Spouse Name: N/A    Number of Children: 3  . Years of Education: N/A   Occupational History  . Student    Social History Main Topics  . Smoking status: Former Smoker -- 0.8 packs/day for 20 years    Types: Cigarettes    Quit date: 08/13/1985  . Smokeless tobacco: Never Used  . Alcohol Use: Yes     rare  . Drug Use: No  . Sexually Active: Not on file   Other Topics Concern  . Not on file   Social History Narrative   Lives with husband and grandchild. School full time for paralegalCaffeine use; occasional   ROS-see HPI Constitutional:   No-   weight loss, night sweats, fevers, chills, fatigue, lassitude. HEENT:   No-  headaches, difficulty swallowing, tooth/dental problems, sore throat,       No-  sneezing, +itching, +ear ache, nasal congestion, post nasal drip,  CV:  No-   chest pain, orthopnea, PND, swelling in  lower extremities, anasarca, dizziness, palpitations Resp: No-   shortness of breath with exertion or at rest.              No-   productive cough,  No non-productive cough,  No- coughing up of blood.              No-   change in color of mucus.  No- wheezing.   Skin: + rash or lesions. GI:  No-   heartburn, indigestion, abdominal pain, nausea, vomiting, diarrhea,                 change in bowel habits, loss of appetite GU: No-   dysuria, change in color of urine, no urgency or frequency.  No- flank pain. MS:  No-   joint pain or swelling.  No- decreased range of motion.  No- back pain. Neuro-     nothing unusual Psych:  No- change in mood or affect. No depression + anxiety.  No memory loss.  OBJ- Physical Exam General- Alert, Oriented, Affect-appropriate, Distress- none acute Skin-  rash-none,  excoriation- none. Pale hypopigmented area on lumbar spine- ? vitelligo? Lymphadenopathy- none Head- atraumatic            Eyes- Gross vision intact, PERRLA, conjunctivae and secretions clear            Ears- Hearing, canals-normal            Nose- Clear, no-Septal dev, mucus, polyps, erosion, perforation             Throat- Mallampati III-IV  , mucosa clear , drainage- none, tonsils- atrophic Neck- flexible , trachea midline, no stridor , thyroid nl, carotid no bruit Chest - symmetrical excursion , unlabored           Heart/CV- RRR , no murmur , no gallop  , no rub, nl s1 s2                           - JVD- none , edema- none, stasis changes- none, varices- none           Lung- clear to P&A, wheeze- none, cough- none , dullness-none, rub- none           Chest wall-  Abd- tender-no, distended-no, bowel sounds-present, HSM- no Br/ Gen/ Rectal- Not done, not indicated Extrem- cyanosis- none, clubbing, none, atrophy- none, strength- nl Neuro- grossly intact to observation

## 2011-12-21 LAB — ALLERGEN FOOD PROFILE SPECIFIC IGE
Apple: <0.1 kU/L (ref <0.35)
Chicken IgE: <0.1 kU/L (ref <0.35)
Fish Cod: 0.13 kU/L (ref <0.35)
IgE (Immunoglobulin E), Serum: 33.5 IU/mL (ref 0.0–180.0)
Milk IgE: 0.28 kU/L (ref <0.35)
Tuna IgE: <0.1 kU/L (ref <0.35)
Wheat IgE: <0.1 kU/L (ref <0.35)

## 2011-12-24 DIAGNOSIS — R21 Rash and other nonspecific skin eruption: Secondary | ICD-10-CM | POA: Insufficient documentation

## 2011-12-24 NOTE — Assessment & Plan Note (Signed)
Area on her lumbar spine could be discoloration from tinea. Color contrast is enough to make me wonder about vitiligo which is less likely

## 2011-12-24 NOTE — Assessment & Plan Note (Signed)
Plan-allergy profile for food. Return for allergy skin testing. Attention to the effect of antihistamines taken as needed.

## 2011-12-27 ENCOUNTER — Telehealth: Payer: Self-pay | Admitting: Internal Medicine

## 2011-12-27 NOTE — Progress Notes (Signed)
Quick Note:  Pt aware of results. ______ 

## 2011-12-27 NOTE — Progress Notes (Signed)
Quick Note:  LMTCB ______ 

## 2011-12-27 NOTE — Telephone Encounter (Signed)
Pt aware of results 

## 2012-01-28 ENCOUNTER — Ambulatory Visit (HOSPITAL_BASED_OUTPATIENT_CLINIC_OR_DEPARTMENT_OTHER)
Admission: RE | Admit: 2012-01-28 | Discharge: 2012-01-28 | Disposition: A | Discharge status: Home / Self Care | Payer: Self-pay | Source: Ambulatory Visit | Attending: Family | Admitting: Family

## 2012-01-28 ENCOUNTER — Ambulatory Visit (INDEPENDENT_AMBULATORY_CARE_PROVIDER_SITE_OTHER): Payer: Self-pay | Admitting: Family

## 2012-01-28 ENCOUNTER — Encounter: Payer: Self-pay | Admitting: Family

## 2012-01-28 ENCOUNTER — Telehealth: Payer: Self-pay | Admitting: Family

## 2012-01-28 VITALS — BP 110/70 | HR 96 | Temp 98.0°F | Resp 16 | Ht 65.0 in | Wt 171.1 lb

## 2012-01-28 DIAGNOSIS — R079 Chest pain, unspecified: Secondary | ICD-10-CM

## 2012-01-28 DIAGNOSIS — J329 Chronic sinusitis, unspecified: Secondary | ICD-10-CM

## 2012-01-28 DIAGNOSIS — R109 Unspecified abdominal pain: Secondary | ICD-10-CM | POA: Insufficient documentation

## 2012-01-28 DIAGNOSIS — R110 Nausea: Secondary | ICD-10-CM | POA: Insufficient documentation

## 2012-01-28 DIAGNOSIS — K5730 Diverticulosis of large intestine without perforation or abscess without bleeding: Secondary | ICD-10-CM | POA: Insufficient documentation

## 2012-01-28 DIAGNOSIS — N390 Urinary tract infection, site not specified: Secondary | ICD-10-CM

## 2012-01-28 DIAGNOSIS — F341 Dysthymic disorder: Secondary | ICD-10-CM

## 2012-01-28 LAB — POCT URINALYSIS DIPSTICK
Bilirubin, UA: NEGATIVE
Glucose, UA: NEGATIVE
Nitrite, UA: NEGATIVE
Urobilinogen, UA: 0.2

## 2012-01-28 LAB — BASIC METABOLIC PANEL WITH GFR
CO2: 31 mEq/L (ref 19–32)
Calcium: 9.8 mg/dL (ref 8.4–10.5)
GFR, Est Non African American: 71 mL/min
Sodium: 142 mEq/L (ref 135–145)

## 2012-01-28 LAB — HEPATIC FUNCTION PANEL
Albumin: 4.5 g/dL (ref 3.5–5.2)
Alkaline Phosphatase: 61 U/L (ref 39–117)
Total Bilirubin: 0.5 mg/dL (ref 0.3–1.2)
Total Protein: 7.1 g/dL (ref 6.0–8.3)

## 2012-01-28 LAB — TSH: TSH: 2.948 u[IU]/mL (ref 0.350–4.500)

## 2012-01-28 MED ORDER — PANTOPRAZOLE SODIUM 40 MG PO TBEC: 40.0000 mg | DELAYED_RELEASE_TABLET | Freq: Two times a day (BID) | ORAL | Status: DC

## 2012-01-28 MED ORDER — CIPROFLOXACIN HCL 500 MG PO TABS: 500.0000 mg | ORAL_TABLET | Freq: Two times a day (BID) | ORAL | Status: DC

## 2012-01-28 MED ORDER — ALPRAZOLAM 0.5 MG PO TBDP: 0.5000 mg | ORAL_TABLET | Freq: Two times a day (BID) | ORAL | Status: DC | PRN

## 2012-01-28 MED ORDER — ALPRAZOLAM 0.5 MG PO TABS: 0.5000 mg | ORAL_TABLET | Freq: Three times a day (TID) | ORAL | Status: DC | PRN

## 2012-01-28 NOTE — Telephone Encounter (Signed)
Left message requesting that the pt return our call. When she calls back, pls let her know UA shows possible UTI.  I would like for her to start cipro which I have sent to her pharmacy.

## 2012-01-28 NOTE — Telephone Encounter (Signed)
Notified pt. 

## 2012-01-28 NOTE — Progress Notes (Signed)
Subjective:    Patient ID: Jamie [name], female    DOB: March 19, 1951, 61 y.o.   MRN: [id number]  HPI  [name] is a 61 yr old female who presents today with chief complaint of nausea.  She reports multiple associated symptoms including weakness, bilateral arm weakness, palpitations, dizziness.  Symptoms started approximately 8 days ago.  She denies associated fever or sick contacts.  She has been tolerating po's.    Anxiety/Depression- reports "some stress", but reports overall mood is good.   Nasal congestion- blowing out green nasal drainage.  + headache.    Review of Systems See HPI  Past Medical History  Diagnosis Date  . Anxiety   . Hyperlipidemia   . Diverticulosis   . IBS (irritable bowel syndrome)   . Depression     History   Social History  . Marital Status: Married    Spouse Name: N/A    Number of Children: 3  . Years of Education: N/A   Occupational History  . Student    Social History Main Topics  . Smoking status: Former Smoker -- 0.8 packs/day for 20 years    Types: Cigarettes    Quit date: 08/13/1985  . Smokeless tobacco: Never Used  . Alcohol Use: Yes     rare  . Drug Use: No  . Sexually Active: Not on file   Other Topics Concern  . Not on file   Social History Narrative   Lives with husband and grandchild. School full time for paralegalCaffeine use; occasional    Past Surgical History  Procedure Date  . Abdominal hysterectomy   . Tubal ligation   . Hemorrhoid surgery   . Tumor removal     Left thigh     Family History  Problem Relation Age of Onset  . Cancer Mother     renal cell carcinoma  . Diabetes Mother   . Heart disease Father     first MI in 75s  . Cancer Sister     cervical cancer  . Heart disease Brother     first MI in 93s  . Alcohol abuse Sister   . Fibromyalgia Sister   . Arthritis Sister     osteoarthritis  . Irritable bowel syndrome Sister   . Leukemia      grandson    Allergies  Allergen Reactions  .  Shellfish Allergy Swelling    Current Outpatient Prescriptions on File Prior to Visit  Medication Sig Dispense Refill  . bismuth subsalicylate (PEPTO-BISMOL) 262 MG/15ML suspension Take 15 mLs by mouth as needed.       . nitroGLYCERIN (NITROSTAT) 0.4 MG SL tablet 0.4 mg every 5 (five) minutes as needed. For chest pain      . NON FORMULARY Take 1 scoop by mouth daily as needed. [name] FIBER  Mix 1 scoop in water daily.      . pantoprazole (PROTONIX) 40 MG tablet Take 1 tablet (40 mg total) by mouth 2 (two) times daily before a meal.  60 tablet  2    BP 110/70  Pulse 96  Temp 98 F (36.7 C) (Oral)  Resp 16  Ht 5\' 5"  (1.651 m)  Wt 171 lb 1.9 oz (77.62 kg)  BMI 28.48 kg/m2  SpO2 97%       Objective:   Physical Exam  Constitutional: She appears well-developed and well-nourished. No distress.  HENT:  Head: Normocephalic and atraumatic.  Right Ear: Tympanic membrane and ear canal normal.  Left  Ear: Tympanic membrane and ear canal normal.  Mouth/Throat: No oropharyngeal exudate, posterior oropharyngeal edema or posterior oropharyngeal erythema.  Cardiovascular: Normal rate and regular rhythm.   No murmur heard. Pulmonary/Chest: Effort normal and breath sounds normal. No respiratory distress. She has no wheezes. She has no rales. She exhibits no tenderness.  Abdominal: Soft. Bowel sounds are normal.       Diffuse abdominal tenderness without guarding.   Lymphadenopathy:    She has no cervical adenopathy.  Psychiatric: She has a normal mood and affect. Her behavior is normal. Judgment and thought content normal.          Assessment & Plan:

## 2012-01-29 ENCOUNTER — Telehealth: Payer: Self-pay | Admitting: Family

## 2012-01-29 DIAGNOSIS — N390 Urinary tract infection, site not specified: Secondary | ICD-10-CM

## 2012-01-29 DIAGNOSIS — J329 Chronic sinusitis, unspecified: Secondary | ICD-10-CM | POA: Insufficient documentation

## 2012-01-29 DIAGNOSIS — R110 Nausea: Secondary | ICD-10-CM

## 2012-01-29 DIAGNOSIS — Z0000 Encounter for general adult medical examination without abnormal findings: Secondary | ICD-10-CM

## 2012-01-29 DIAGNOSIS — R7309 Other abnormal glucose: Secondary | ICD-10-CM

## 2012-01-29 DIAGNOSIS — R109 Unspecified abdominal pain: Secondary | ICD-10-CM

## 2012-01-29 HISTORY — DX: Nausea: R11.0

## 2012-01-29 HISTORY — DX: Urinary tract infection, site not specified: N39.0

## 2012-01-29 MED ORDER — CIPROFLOXACIN HCL 500 MG PO TABS: 500.0000 mg | ORAL_TABLET | Freq: Two times a day (BID) | ORAL | Status: AC

## 2012-01-29 NOTE — Telephone Encounter (Signed)
CBC was done in February and was normal.  I don't think this needs to be rechecked at this time.  OK to check FLP (V70.0) please.

## 2012-01-29 NOTE — Assessment & Plan Note (Signed)
Clinically stable.  She is requesting xanax 0.5 for cost purposes as she cuts them in half.

## 2012-01-29 NOTE — Assessment & Plan Note (Signed)
Plan to extend cipro x 7 days total to cover sinus and UTI.

## 2012-01-29 NOTE — Telephone Encounter (Addendum)
Pls call pt and let her know that her CT is negative.  Shows only diverticulosis (pouches in walls of her colon) which is very common and not likely related to her abdominal pain/nausea.  UTI could be contributing.  I have sent an additional 4 days of cipro to her pharmacy for her to complete a total of 7 days due to her complaint of green sinus drainage.  She should let us know if her symptoms worsen, of if symptoms do not continue to improve.  Blood work looks good, except sugar is a little elevated.  Please add on a1c if lab able, otherwise, please ask pt to return to the lab for a1c (Hyperglcemia).

## 2012-01-29 NOTE — Telephone Encounter (Signed)
Per verbal from Provider, ok to order CBC for dx of abdominal pain as pt is concerned she may have an ulcer. Future lab orders entered and given to the lab.

## 2012-01-29 NOTE — Assessment & Plan Note (Addendum)
61 yr old female with 1 week hx of nausea, abdominal tenderness.  CT abdomen/pelvis is obtained and is notable only for diverticulosis.  Suspect viral etiology versus due to UTI.

## 2012-01-29 NOTE — Assessment & Plan Note (Signed)
UA suggestive of UTI.  Will plan to send urine for culture and treat with cipro.  This may be contributing to her nausea.

## 2012-01-29 NOTE — Telephone Encounter (Signed)
Notified pt. She wants to know if she can also have her cholesterol and cbc checked when she has her hgb a1c drawn. Wants to have tests drawn tomorrow. If ok to add please advise dx codes.

## 2012-01-30 ENCOUNTER — Telehealth: Payer: Self-pay | Admitting: Family

## 2012-01-30 LAB — LIPID PANEL
LDL Cholesterol: 154 mg/dL — ABNORMAL HIGH (ref 0–99)
Total CHOL/HDL Ratio: 5 Ratio
VLDL: 38 mg/dL (ref 0–40)

## 2012-01-30 LAB — CBC WITH DIFFERENTIAL/PLATELET
Eosinophils Absolute: 0.1 10*3/uL (ref 0.0–0.7)
Lymphocytes Relative: 32 % (ref 12–46)
Lymphs Abs: 1.5 10*3/uL (ref 0.7–4.0)
Neutro Abs: 2.7 10*3/uL (ref 1.7–7.7)
Neutrophils Relative %: 57 % (ref 43–77)
Platelets: 298 10*3/uL (ref 150–400)
RBC: 4.4 MIL/uL (ref 3.87–5.11)
WBC: 4.7 10*3/uL (ref 4.0–10.5)

## 2012-01-30 LAB — URINE CULTURE
Colony Count: NO GROWTH
Organism ID, Bacteria: NO GROWTH

## 2012-01-30 NOTE — Telephone Encounter (Signed)
Pls call pt and let her know that her urine culture is negative, she can stop cipro.

## 2012-01-30 NOTE — Telephone Encounter (Signed)
Notified pt of culture result and per previous Provider instruction pt will continue Cipro (see phone note 01/29/12).

## 2012-01-30 NOTE — Telephone Encounter (Signed)
Pt presented to the lab, future orders released. 

## 2012-01-30 NOTE — Addendum Note (Signed)
Addended by: Mervin Kung A on: 01/30/2012 09:22 AM   Modules accepted: Orders

## 2012-01-31 ENCOUNTER — Other Ambulatory Visit: Payer: Self-pay | Admitting: Family

## 2012-01-31 ENCOUNTER — Emergency Department (HOSPITAL_BASED_OUTPATIENT_CLINIC_OR_DEPARTMENT_OTHER)
Admission: EM | Admit: 2012-01-31 | Discharge: 2012-01-31 | Disposition: A | Discharge status: Home / Self Care | Payer: Self-pay | Attending: Emergency Medicine | Admitting: Emergency Medicine

## 2012-01-31 ENCOUNTER — Emergency Department (HOSPITAL_BASED_OUTPATIENT_CLINIC_OR_DEPARTMENT_OTHER): Payer: Self-pay

## 2012-01-31 ENCOUNTER — Encounter (HOSPITAL_BASED_OUTPATIENT_CLINIC_OR_DEPARTMENT_OTHER): Payer: Self-pay | Admitting: *Deleted

## 2012-01-31 DIAGNOSIS — R209 Unspecified disturbances of skin sensation: Secondary | ICD-10-CM | POA: Insufficient documentation

## 2012-01-31 DIAGNOSIS — E785 Hyperlipidemia, unspecified: Secondary | ICD-10-CM

## 2012-01-31 DIAGNOSIS — R200 Anesthesia of skin: Secondary | ICD-10-CM

## 2012-01-31 DIAGNOSIS — Z79899 Other long term (current) drug therapy: Secondary | ICD-10-CM | POA: Insufficient documentation

## 2012-01-31 DIAGNOSIS — Z87891 Personal history of nicotine dependence: Secondary | ICD-10-CM | POA: Insufficient documentation

## 2012-01-31 DIAGNOSIS — R531 Weakness: Secondary | ICD-10-CM

## 2012-01-31 DIAGNOSIS — F341 Dysthymic disorder: Secondary | ICD-10-CM | POA: Insufficient documentation

## 2012-01-31 DIAGNOSIS — K589 Irritable bowel syndrome without diarrhea: Secondary | ICD-10-CM | POA: Insufficient documentation

## 2012-01-31 DIAGNOSIS — R5381 Other malaise: Secondary | ICD-10-CM | POA: Insufficient documentation

## 2012-01-31 LAB — COMPREHENSIVE METABOLIC PANEL
ALT: 22 U/L (ref 0–35)
AST: 23 U/L (ref 0–37)
CO2: 30 mEq/L (ref 19–32)
Chloride: 104 mEq/L (ref 96–112)
Creatinine, Ser: 0.8 mg/dL (ref 0.50–1.10)
GFR calc non Af Amer: 79 mL/min — ABNORMAL LOW (ref >90)
Total Bilirubin: 0.4 mg/dL (ref 0.3–1.2)

## 2012-01-31 LAB — DIFFERENTIAL
Basophils Absolute: 0.1 10*3/uL (ref 0.0–0.1)
Lymphocytes Relative: 26 % (ref 12–46)
Lymphs Abs: 1.1 10*3/uL (ref 0.7–4.0)
Monocytes Absolute: 0.5 10*3/uL (ref 0.1–1.0)
Neutro Abs: 2.5 10*3/uL (ref 1.7–7.7)

## 2012-01-31 LAB — URINE MICROSCOPIC-ADD ON

## 2012-01-31 LAB — URINALYSIS, ROUTINE W REFLEX MICROSCOPIC
Bilirubin Urine: NEGATIVE
Hgb urine dipstick: NEGATIVE
Nitrite: NEGATIVE

## 2012-01-31 LAB — CBC
HCT: 38.3 % (ref 36.0–46.0)
Hemoglobin: 13.8 g/dL (ref 12.0–15.0)
RBC: 4.38 MIL/uL (ref 3.87–5.11)
RDW: 12.5 % (ref 11.5–15.5)
WBC: 4.3 10*3/uL (ref 4.0–10.5)

## 2012-01-31 MED ORDER — SIMVASTATIN 20 MG PO TABS: 20.0000 mg | ORAL_TABLET | Freq: Every day | ORAL | Status: DC

## 2012-01-31 NOTE — Telephone Encounter (Signed)
Please call pt and let her know that her cholesterol remains elevated.  I recommend that she start simvastatin and follow up in 6 weeks for flp/lft please.  Pended rx.

## 2012-01-31 NOTE — ED Provider Notes (Signed)
History     CSN: [id number]  Arrival date & time 01/31/12  3086   First MD Initiated Contact with Patient 01/31/12 1020      Chief Complaint  Patient presents with  . Allergic Reaction    (Consider location/radiation/quality/duration/timing/severity/associated sxs/prior treatment) HPI Comments: Patient has been on cipro for the past 3 days.  She tells me that every time she takes this, her face becomes numb and itchy.  She complains of headache, tongue numbness, feels weak, no energy.  She called her pcp and was told to come here because she might be having a stroke.  Worse with cipro, no alleviating factors.  The symptoms are getting worse over the past few days.    The history is provided by the patient.    Past Medical History  Diagnosis Date  . Anxiety   . Hyperlipidemia   . Diverticulosis   . IBS (irritable bowel syndrome)   . Depression     Past Surgical History  Procedure Date  . Abdominal hysterectomy   . Tubal ligation   . Hemorrhoid surgery   . Tumor removal     Left thigh     Family History  Problem Relation Age of Onset  . Cancer Mother     renal cell carcinoma  . Diabetes Mother   . Heart disease Father     first MI in 60s  . Cancer Sister     cervical cancer  . Heart disease Brother     first MI in 53s  . Alcohol abuse Sister   . Fibromyalgia Sister   . Arthritis Sister     osteoarthritis  . Irritable bowel syndrome Sister   . Leukemia      grandson    History  Substance Use Topics  . Smoking status: Former Smoker -- 0.8 packs/day for 20 years    Types: Cigarettes    Quit date: 08/13/1985  . Smokeless tobacco: Never Used  . Alcohol Use: Yes     rare    OB History    Grav Para Term Preterm Abortions TAB SAB Ect Mult Living                  Review of Systems  Constitutional: Positive for activity change and fatigue. Negative for fever and chills.  HENT: Positive for facial swelling.   Respiratory: Positive for shortness of  breath.   Cardiovascular: Negative for chest pain.  All other systems reviewed and are negative.    Allergies  Shellfish allergy  Home Medications   Current Outpatient Rx  Name Route Sig Dispense Refill  . ALPRAZOLAM 0.5 MG PO TABS Oral Take 1 tablet (0.5 mg total) by mouth 3 (three) times daily as needed for sleep. 30 tablet 0  . BISMUTH SUBSALICYLATE 262 MG/15ML PO SUSP Oral Take 15 mLs by mouth as needed.     Marland Kitchen CIPROFLOXACIN HCL 500 MG PO TABS Oral Take 1 tablet (500 mg total) by mouth 2 (two) times daily. 8 tablet 0  . NITROGLYCERIN 0.4 MG SL SUBL  0.4 mg every 5 (five) minutes as needed. For chest pain    . NON FORMULARY Oral Take 1 scoop by mouth daily as needed. [name] FIBER  Mix 1 scoop in water daily.    Marland Kitchen PANTOPRAZOLE SODIUM 40 MG PO TBEC Oral Take 1 tablet (40 mg total) by mouth 2 (two) times daily before a meal. 60 tablet 2    BP 127/74  Pulse 82  Temp  98.2 F (36.8 C) (Oral)  Resp 20  Ht 5\' 5"  (1.651 m)  Wt 172 lb (78.019 kg)  BMI 28.62 kg/m2  SpO2 100%  Physical Exam  Nursing note and vitals reviewed. Constitutional: She is oriented to person, place, and time. She appears well-developed and well-nourished. No distress.  HENT:  Head: Normocephalic and atraumatic.  Eyes: EOM are normal. Pupils are equal, round, and reactive to light.  Neck: Normal range of motion. Neck supple.  Cardiovascular: Normal rate and regular rhythm.  Exam reveals no gallop and no friction rub.   No murmur heard. Pulmonary/Chest: Effort normal and breath sounds normal. No respiratory distress. She has no wheezes.  Abdominal: Soft. Bowel sounds are normal. She exhibits no distension. There is no tenderness.  Musculoskeletal: Normal range of motion.  Neurological: She is alert and oriented to person, place, and time. No cranial nerve deficit. She exhibits normal muscle tone. Coordination normal.  Skin: Skin is warm and dry. She is not diaphoretic.    ED Course  Procedures  (including critical care time)   Labs Reviewed  CBC  DIFFERENTIAL  COMPREHENSIVE METABOLIC PANEL  URINALYSIS, ROUTINE W REFLEX MICROSCOPIC   No results found.   No diagnosis found.   Date: 01/31/2012  Rate: 76  Rhythm: normal sinus rhythm  QRS Axis: normal  Intervals: normal  ST/T Wave abnormalities: normal  Conduction Disutrbances:none  Narrative Interpretation:   Old EKG Reviewed: unchanged    MDM  The patient presents with very vague symptoms that are seemingly unrelated.  Her exam is unremarkable and the workup is normal as well, with the exception of trace leukocytes in the urine.  I doubt this is significant.  She was concerned she is having a stroke, but nothing indicates this.  She will be discharged to home with follow up with pcp.          Geoffery Lyons, MD 01/31/12 [phone]

## 2012-01-31 NOTE — Telephone Encounter (Signed)
Notified pt of instructions. Pt reports that she had an episode last night of facial redness, burning and tingling after taking her Cipro. Went to drug store and checked BP (143/90) last night. Pt reports that symptoms began about 30 minutes after taking Cipro.  Advised pt not to take Cipro until further notice.  Pt denies difficulty with breathing, swallowing or speech difficulties. Pt reports that she had leg weakness last night. States that her face is still tingling today and she still feels weak. Advised pt she should be evaluated in the ER. Pt states she will try to find someone to take her. Advised her to call 911 if no one is available. Pt voices understanding. Will hold simvastatin rx until after pt is evaluated in the ER.

## 2012-01-31 NOTE — Discharge Instructions (Signed)
Paresthesia  Paresthesia is an abnormal burning or prickling sensation. This sensation is generally felt in the hands, arms, legs, or feet. However, it may occur in any part of the body. It is usually not painful. The feeling may be described as:  · Tingling or numbness.  · "Pins and needles."  · Skin crawling.  · Buzzing.  · Limbs "falling asleep."  · Itching.  Most people experience temporary (transient) paresthesia at some time in their lives.  CAUSES   Paresthesia may occur when you breathe too quickly (hyperventilation). It can also occur without any apparent cause. Commonly, paresthesia occurs when pressure is placed on a nerve. The feeling quickly goes away once the pressure is removed. For some people, however, paresthesia is a long-lasting (chronic) condition caused by an underlying disorder. The underlying disorder may be:  · A traumatic, direct injury to nerves. Examples include a:  · Broken (fractured) neck.  · Fractured skull.  · A disorder affecting the brain and spinal cord (central nervous system). Examples include:  · Transverse myelitis.  · Encephalitis.  · Transient ischemic attack.  · Multiple sclerosis.  · Stroke.  · Tumor or blood vessel problems, such as an arteriovenous malformation pressing against the brain or spinal cord.  · A condition that damages the peripheral nerves (peripheral neuropathy). Peripheral nerves are not part of the brain and spinal cord. These conditions include:  · Diabetes.  · Peripheral vascular disease.  · Nerve entrapment syndromes, such as carpal tunnel syndrome.  · Shingles.  · Hypothyroidism.  · Vitamin B12 deficiencies.  · Alcoholism.  · Heavy metal poisoning (lead, arsenic).  · Rheumatoid arthritis.  · Systemic lupus erythematosus.  DIAGNOSIS   Your caregiver will attempt to find the underlying cause of your paresthesia. Your caregiver may:  · Take your medical history.  · Perform a physical exam.  · Order various lab tests.  · Order imaging tests.  TREATMENT    Treatment for paresthesia depends on the underlying cause.  HOME CARE INSTRUCTIONS  · Avoid drinking alcohol.  · You may consider massage or acupuncture to help relieve your symptoms.  · Keep all follow-up appointments as directed by your caregiver.  SEEK IMMEDIATE MEDICAL CARE IF:   · You feel weak.  · You have trouble walking or moving.  · You have problems with speech or vision.  · You feel confused.  · You cannot control your bladder or bowel movements.  · You feel numbness after an injury.  · You faint.  · Your burning or prickling feeling gets worse when walking.  · You have pain, cramps, or dizziness.  · You develop a rash.  MAKE SURE YOU:  · Understand these instructions.  · Will watch your condition.  · Will get help right away if you are not doing well or get worse.  Document Released: 07/20/2002 Document Revised: 07/19/2011 Document Reviewed: 04/20/2011  [employer]® Patient Information ©2012 [employer].

## 2012-01-31 NOTE — ED Notes (Signed)
Patient ambulatory to triage; gait steady.  Cipro started 3 days ago; patient reports numbness and tingling in the face and tongue.

## 2012-01-31 NOTE — ED Notes (Signed)
Pt amb to room 5 with quick steady gait in nad. Pt reports intermittant itching and burning, and tingling to her face, eyes and tongue since starting cipro 3 days ago. Pt feels she is having an allergic reaction to the cipro. States she has taken cipro in the past with no problems, but states her symptoms seem to intensify immediately after taking her morning and evening doses of the cipro. Speech is clear, moe + x 4 ext., pearl, grips are = and strong, smile is symetrical.

## 2012-02-04 NOTE — Telephone Encounter (Signed)
Left message for pt to return my call. Did pt start simvastatin or is she going to? If so, will need future labs in 6 weeks as instructed below.

## 2012-02-05 ENCOUNTER — Telehealth: Payer: Self-pay | Admitting: *Deleted

## 2012-02-05 DIAGNOSIS — E785 Hyperlipidemia, unspecified: Secondary | ICD-10-CM

## 2012-02-05 MED ORDER — AMOXICILLIN-POT CLAVULANATE 875-125 MG PO TABS: 1.0000 | ORAL_TABLET | Freq: Two times a day (BID) | ORAL | Status: AC

## 2012-02-05 NOTE — Telephone Encounter (Signed)
Spoke to pt, she has not picked up simvastatin yet but will start it soon. Pt was made aware to return to the lab around 03/18/12 for repeat lipids/lfts. Future lab order entered. Copy sent to the lab and mailed to pt.

## 2012-02-05 NOTE — Addendum Note (Signed)
Addended by: Mervin Kung A on: 02/05/2012 09:31 AM   Modules accepted: Orders

## 2012-02-05 NOTE — Telephone Encounter (Signed)
Spoke to pt, she continues to have green sinus drainage. Rx sent to [facility] as instructed below. Pt is aware.

## 2012-02-05 NOTE — Telephone Encounter (Signed)
Message copied by Kathi Simpers on Tue Feb 05, 2012  9:31 AM ------      Message from: O'SULLIVAN, MELISSA      Created: Fri Feb 01, 2012  9:28 PM       pls call pt on Monday.  If persistent green sinus drainage, can call in Augmentin [phone] bid x 7 days #[phone]      ----- Message -----         From: Kathi Simpers, CMA         Sent: 02/01/2012  10:15 AM           To: Sandford Craze, NP            Please review ED note and advise re: antibiotic. Do I need to add Cipro to allergy list? Do you want her to try different antibiotic?  Please advise.

## 2012-02-13 ENCOUNTER — Ambulatory Visit (INDEPENDENT_AMBULATORY_CARE_PROVIDER_SITE_OTHER): Payer: Self-pay | Admitting: Internal Medicine

## 2012-02-13 ENCOUNTER — Encounter: Payer: Self-pay | Admitting: Internal Medicine

## 2012-02-13 VITALS — BP 102/68 | HR 106 | Ht 65.0 in | Wt 172.4 lb

## 2012-02-13 DIAGNOSIS — J302 Other seasonal allergic rhinitis: Secondary | ICD-10-CM

## 2012-02-13 DIAGNOSIS — J3089 Other allergic rhinitis: Secondary | ICD-10-CM

## 2012-02-13 DIAGNOSIS — B360 Pityriasis versicolor: Secondary | ICD-10-CM

## 2012-02-13 DIAGNOSIS — R21 Rash and other nonspecific skin eruption: Secondary | ICD-10-CM

## 2012-02-13 DIAGNOSIS — J309 Allergic rhinitis, unspecified: Secondary | ICD-10-CM

## 2012-02-13 NOTE — Progress Notes (Signed)
12/20/11- 25 yoF former smoker referred by Dr Delford Field for allergy evaluation She complains of rash especially when she eats seafood, over the past 18 months. She has generalized itching 2 days with rash she describes as large clusters of raised red areas. She gets relief from bland lotions and Benadryl. She suspects seafood, especially shrimp. First onset of this rash was noted within a few hours of eating a shrimp dish. She denies history of seasonal allergic rhinitis. She avoids irritants like tobacco smoke but has no diagnosis of asthma. Milk products seem to increase mucus in her throat and make her cough. She has seen Dr. Delford Field for dyspnea which has improved. Mold was identified in her attic, and remediation is being planned. Allergy profile 08/30/2011 was negative with total IgE 40.6.  02/13/12- 61 yoF former smoker referred by Dr Delford Field for allergy evaluation, question food allergy, allergic rhinitis, tinea No OTC antihistamine, OTC cough syrup or OTC sleep aids in past three days. No food related events. Describes "white spots" intertriginous areas. Generalized itching with red rash if scratched.  Using a topical tanning product only on exposed areas. Allergy Profile-12/20/2011: Total IgE 33.5. Negative for any elevation of specific allergens on the panel, including shrimp. Allergy Skin Testing: Mild to moderate intradermal reactions for some grass, weed and tree pollens but especially for house dust and dust mite. A few responses on the food panel, not as intense as the histamine control-fish, shellfish, beef, pork, lamb, chicken, chocolate, white potato, soybean   ROS-see HPI Constitutional:   No-   weight loss, night sweats, fevers, chills, fatigue, lassitude. HEENT:   No-  headaches, difficulty swallowing, tooth/dental problems, sore throat,       No-  sneezing, +itching, No-ear ache, nasal congestion, post nasal drip,  CV:  No-   chest pain, orthopnea, PND, swelling in lower extremities,  anasarca, dizziness, palpitations Resp: No-   shortness of breath with exertion or at rest.              No-   productive cough,  No non-productive cough,  No- coughing up of blood.              No-   change in color of mucus.  No- wheezing.   Skin: + rash or lesions. GI:  No-   heartburn, indigestion, abdominal pain, nausea, vomiting,  GU: . MS:  No-   joint pain or swelling.   Neuro-     nothing unusual Psych:  No- change in mood or affect. No depression + anxiety.  No memory loss.  OBJ- Physical Exam General- Alert, Oriented, Affect-appropriate, Distress- none acute Skin-  Irregular hypopigmented areas on sternum. The area high on her sacrum noted last time is less apparent today. Insignificant dermographia when scratched. Lymphadenopathy- none Head- atraumatic            Eyes- Gross vision intact, PERRLA, conjunctivae and secretions clear            Ears- Hearing, canals-normal            Nose- Clear, no-Septal dev, mucus, polyps, erosion, perforation             Throat- Mallampati III-IV  , mucosa clear , drainage- none, tonsils- atrophic Neck- flexible , trachea midline, no stridor , thyroid nl, carotid no bruit Chest - symmetrical excursion , unlabored           Heart/CV- RRR , no murmur , no gallop  , no rub, nl s1 s2                           -  JVD- none , edema- none, stasis changes- none, varices- none           Lung- clear to P&A, wheeze- none, cough- none , dullness-none, rub- none           Chest wall-  Abd-  Br/ Gen/ Rectal- Not done, not indicated Extrem- cyanosis- none, clubbing, none, atrophy- none, strength- nl Neuro- grossly intact to observation

## 2012-02-13 NOTE — Patient Instructions (Addendum)
Try using Selsun Blue otc shampoo as a body wash for 2 weeks- see if it clears the white spots.  Please call as needed

## 2012-02-17 NOTE — Assessment & Plan Note (Signed)
The white areas that worry her are most consistent with tinea. They do not look like atopic dermatitis or eczema. She has been trying to make them go away by scrubbing with a harsh cloth. Plan-use Selsun Blue as a body wash for 2 weeks. If that is no help, we can try Diflucan before referring to dermatology. This is really her primary expressed concern now. She seems much less worried about any possible food disturbance.

## 2012-02-17 NOTE — Assessment & Plan Note (Signed)
She is focused on treatment changes that look like tinea now. I don't have much to suggest a significant IgE food allergy. We will watch.

## 2012-02-17 NOTE — Assessment & Plan Note (Signed)
Symptoms and exam are minimal for rhinitis now. We will watch in the fall season.

## 2012-02-25 ENCOUNTER — Encounter: Payer: Self-pay | Admitting: Internal Medicine

## 2012-02-25 ENCOUNTER — Telehealth: Payer: Self-pay | Admitting: Family

## 2012-02-25 ENCOUNTER — Other Ambulatory Visit: Payer: Self-pay | Admitting: Internal Medicine

## 2012-02-25 NOTE — Telephone Encounter (Signed)
Patient states that she lost her rx for alprazolam and would like a new rx sent to [facility] on [address]  Also, patient states that [facility] wrote her a prescription for augmentin, but she could not afford this medicine. She would like melissa to call her in another antibiotic but not Cipro.

## 2012-02-26 MED ORDER — ALPRAZOLAM 0.5 MG PO TABS: 0.5000 mg | ORAL_TABLET | Freq: Three times a day (TID) | ORAL | Status: AC | PRN

## 2012-02-26 MED ORDER — AMOXICILLIN 500 MG PO CAPS: 1000.0000 mg | ORAL_CAPSULE | Freq: Three times a day (TID) | ORAL | Status: AC

## 2012-02-26 NOTE — Telephone Encounter (Signed)
Spoke with pt, she states she is still having symptoms of a sinus infection as she has been unable to get pick up the augmentin due to the cost. Pt is requesting a cheaper alternative. Advised pt alprazolam will be called to the pharmacy.  Please advise re: abx alternative.

## 2012-02-26 NOTE — Telephone Encounter (Signed)
OK to send #30 of alprazolam no refills.  Last antibiotic called in was cipro 1 month ago for possible UTI.  Urine culture was negative.  If she is having new or recurrent symptoms requiring antibiotics, she will need to be seen in office.

## 2012-02-26 NOTE — Telephone Encounter (Signed)
Notified pt and she voices understanding. 

## 2012-02-26 NOTE — Telephone Encounter (Signed)
Rx sent for amoxicillin.  She should be seen in the office if she develops fever, or if symptoms are not improved in 2-3 days.

## 2012-03-27 ENCOUNTER — Ambulatory Visit: Payer: Self-pay | Admitting: Internal Medicine

## 2012-04-25 ENCOUNTER — Ambulatory Visit (HOSPITAL_BASED_OUTPATIENT_CLINIC_OR_DEPARTMENT_OTHER)
Admission: RE | Admit: 2012-04-25 | Discharge: 2012-04-25 | Disposition: A | Discharge status: Home / Self Care | Payer: Self-pay | Source: Ambulatory Visit | Attending: Family | Admitting: Family

## 2012-04-25 ENCOUNTER — Encounter: Payer: Self-pay | Admitting: Family

## 2012-04-25 ENCOUNTER — Ambulatory Visit (INDEPENDENT_AMBULATORY_CARE_PROVIDER_SITE_OTHER): Payer: Self-pay | Admitting: Family

## 2012-04-25 VITALS — BP 100/76 | HR 109 | Resp 16 | Ht 65.0 in | Wt 173.1 lb

## 2012-04-25 DIAGNOSIS — K5792 Diverticulitis of intestine, part unspecified, without perforation or abscess without bleeding: Secondary | ICD-10-CM | POA: Insufficient documentation

## 2012-04-25 DIAGNOSIS — R109 Unspecified abdominal pain: Secondary | ICD-10-CM | POA: Insufficient documentation

## 2012-04-25 DIAGNOSIS — R197 Diarrhea, unspecified: Secondary | ICD-10-CM | POA: Insufficient documentation

## 2012-04-25 DIAGNOSIS — R350 Frequency of micturition: Secondary | ICD-10-CM

## 2012-04-25 DIAGNOSIS — R3129 Other microscopic hematuria: Secondary | ICD-10-CM

## 2012-04-25 DIAGNOSIS — K5732 Diverticulitis of large intestine without perforation or abscess without bleeding: Secondary | ICD-10-CM

## 2012-04-25 DIAGNOSIS — K5730 Diverticulosis of large intestine without perforation or abscess without bleeding: Secondary | ICD-10-CM | POA: Insufficient documentation

## 2012-04-25 LAB — HEPATIC FUNCTION PANEL
ALT: 23 U/L (ref 0–35)
Albumin: 4.8 g/dL (ref 3.5–5.2)
Total Protein: 7.5 g/dL (ref 6.0–8.3)

## 2012-04-25 LAB — LIPID PANEL
Cholesterol: 293 mg/dL — ABNORMAL HIGH (ref 0–200)
HDL: 49 mg/dL (ref >39)
LDL Cholesterol: 190 mg/dL — ABNORMAL HIGH (ref 0–99)
Triglycerides: 268 mg/dL — ABNORMAL HIGH (ref <150)

## 2012-04-25 LAB — POCT URINALYSIS DIPSTICK
Bilirubin, UA: NEGATIVE
Glucose, UA: NEGATIVE
Ketones, UA: NEGATIVE
Nitrite, UA: NEGATIVE
pH, UA: 6

## 2012-04-25 MED ORDER — AMOXICILLIN-POT CLAVULANATE 875-125 MG PO TABS: 1.0000 | ORAL_TABLET | Freq: Two times a day (BID) | ORAL | Status: DC

## 2012-04-25 NOTE — Progress Notes (Signed)
Subjective:    Patient ID: Jamie [name], female    DOB: 03/08/1951, 61 y.o.   MRN: [id number]  HPI  Otalgia- Patient reports bilateral ear pain x 2 weeks. + headaches,  Urinary frequency-  Denies associated associated fever.  + nausea.  Denies hematuria but notes urine has been orange in color.    Abdominal pain- reports RLQ and LLQ abdominal pain with associated diarrhea x 2 weeks.  She denies hematocheziaReview of Systems See HPI  Past Medical History  Diagnosis Date  . Anxiety   . Hyperlipidemia   . Diverticulosis   . IBS (irritable bowel syndrome)   . Depression     History   Social History  . Marital Status: Married    Spouse Name: N/A    Number of Children: 3  . Years of Education: N/A   Occupational History  . Student    Social History Main Topics  . Smoking status: Former Smoker -- 0.8 packs/day for 20 years    Types: Cigarettes    Quit date: 08/13/1985  . Smokeless tobacco: Never Used  . Alcohol Use: Yes     rare  . Drug Use: No  . Sexually Active: Not on file   Other Topics Concern  . Not on file   Social History Narrative   Lives with husband and grandchild. School full time for paralegalCaffeine use; occasional    Past Surgical History  Procedure Date  . Abdominal hysterectomy   . Tubal ligation   . Hemorrhoid surgery   . Tumor removal     Left thigh     Family History  Problem Relation Age of Onset  . Cancer Mother     renal cell carcinoma  . Diabetes Mother   . Heart disease Father     first MI in 25s  . Cancer Sister     cervical cancer  . Heart disease Brother     first MI in 61s  . Alcohol abuse Sister   . Fibromyalgia Sister   . Arthritis Sister     osteoarthritis  . Irritable bowel syndrome Sister   . Leukemia      grandson    Allergies  Allergen Reactions  . Ciprofloxacin     Facial numbness  . Peanut Butter Flavor     N/V  . Shellfish Allergy Swelling    Current Outpatient Prescriptions on File Prior to Visit   Medication Sig Dispense Refill  . bismuth subsalicylate (PEPTO-BISMOL) 262 MG/15ML suspension Take 15 mLs by mouth as needed.       . NON FORMULARY Take 1 scoop by mouth daily as needed. [name] FIBER  Mix 1 scoop in water daily.      . nitroGLYCERIN (NITROSTAT) 0.4 MG SL tablet 0.4 mg every 5 (five) minutes as needed. For chest pain      . pantoprazole (PROTONIX) 40 MG tablet Take 1 tablet (40 mg total) by mouth 2 (two) times daily before a meal.  60 tablet  2    BP 100/76  Pulse 109  Resp 16  Ht 5\' 5"  (1.651 m)  Wt 173 lb 1.3 oz (78.509 kg)  BMI 28.80 kg/m2  SpO2 97%       Objective:   Physical Exam  Constitutional: She is oriented to person, place, and time. She appears well-developed and well-nourished. No distress.  HENT:  Head: Normocephalic and atraumatic.  Mouth/Throat: Oropharynx is clear and moist. No oropharyngeal exudate, posterior oropharyngeal edema, posterior oropharyngeal erythema or  tonsillar abscesses.       R TM is slightly pink. L TM is occluded by cerumen.   Eyes: No scleral icterus.  Cardiovascular: Normal rate and regular rhythm.   No murmur heard. Pulmonary/Chest: Effort normal and breath sounds normal. No respiratory distress. She has no wheezes. She has no rales. She exhibits no tenderness.  Lymphadenopathy:    She has no cervical adenopathy.  Neurological: She is alert and oriented to person, place, and time.  Skin: Skin is warm and dry. No rash noted. No erythema. No pallor.  Psychiatric: She has a normal mood and affect. Her behavior is normal. Judgment and thought content normal.          Assessment & Plan:

## 2012-04-25 NOTE — Assessment & Plan Note (Addendum)
Suspect diverticulitis.  Pt cannot take cipro.  Will rx with augmentin for diverticulitis. This will also cover OM.  UA shows possible UTI send for culture, add/adjust abx as necessary based on culture data.  Will obtain CT abd/pelvis. Case reviewed with Dr. Rodena Medin.

## 2012-04-25 NOTE — Addendum Note (Signed)
Addended by: Regis Bill on: 04/25/2012 02:56 PM   Modules accepted: Orders

## 2012-04-25 NOTE — Telephone Encounter (Signed)
Lab released/SLS

## 2012-04-25 NOTE — Patient Instructions (Addendum)
Please complete CT on the first floor.  Follow up in 1 week.  Sooner if problems or concerns.

## 2012-04-26 ENCOUNTER — Telehealth: Payer: Self-pay | Admitting: Family

## 2012-04-26 NOTE — Telephone Encounter (Signed)
Attempted to contact patient.  No answer.  Will try back on Monday.  CT is negative.

## 2012-04-27 LAB — URINE CULTURE

## 2012-04-28 ENCOUNTER — Other Ambulatory Visit: Payer: Self-pay | Admitting: Family

## 2012-04-28 MED ORDER — DOXYCYCLINE HYCLATE 100 MG PO CPEP: 100.0000 mg | ORAL_CAPSULE | Freq: Two times a day (BID) | ORAL | Status: DC

## 2012-04-28 MED ORDER — SIMVASTATIN 40 MG PO TABS: 40.0000 mg | ORAL_TABLET | Freq: Every day | ORAL | Status: DC

## 2012-04-28 NOTE — Telephone Encounter (Addendum)
Please call pt to follow up with her and let her know that her CT was negative for diverticulitis.  Urine culture did not grow significant amount of bacteria.  She can stop augmentin.  Also, cholesterol is very high.  Total cholesterol is 293- we want under 200.  I would recommend statin- pended below. She should follow up in 6 weeks for flp and lft (hyperlipidemia)

## 2012-04-28 NOTE — Telephone Encounter (Signed)
She can stop augmentin and start doxycycline for sinus.

## 2012-04-28 NOTE — Telephone Encounter (Signed)
Notified pt. She states that her head seems more stopped up now, feels nauseated from her sinus drainage. Notes that her head and face has felt numb and tingling and feels a little light headed since she started taking Augmentin. Denies changes in vision, speech and no extremity weakness.  Please advise.

## 2012-04-28 NOTE — Telephone Encounter (Signed)
Patient called regarding this.

## 2012-04-29 MED ORDER — DOXYCYCLINE HYCLATE 100 MG PO TABS: 100.0000 mg | ORAL_TABLET | Freq: Two times a day (BID) | ORAL | Status: DC

## 2012-04-29 NOTE — Telephone Encounter (Signed)
Spoke with Bonita Quin at [facility] and gave verbal auth for Doxycycline hyclate as below. Attempted to reach pt and left detailed message on home# and to call if any questions.

## 2012-04-29 NOTE — Telephone Encounter (Signed)
OK to send doxycyline hyclate 100mg  po bid x 7 days #14.

## 2012-04-29 NOTE — Telephone Encounter (Signed)
Received call from [facility] wanting to verify Doxycycline delayed release rx. States doxycycline hyclate would be cheaper for pt.  Please advise.

## 2012-05-02 ENCOUNTER — Ambulatory Visit: Payer: Self-pay | Admitting: Family

## 2012-05-13 ENCOUNTER — Ambulatory Visit: Payer: Self-pay | Admitting: Internal Medicine

## 2012-05-23 ENCOUNTER — Ambulatory Visit: Payer: Self-pay | Admitting: Family

## 2012-05-23 DIAGNOSIS — Z0289 Encounter for other administrative examinations: Secondary | ICD-10-CM

## 2012-06-16 ENCOUNTER — Ambulatory Visit (INDEPENDENT_AMBULATORY_CARE_PROVIDER_SITE_OTHER): Payer: Self-pay | Admitting: Family

## 2012-06-16 ENCOUNTER — Ambulatory Visit (HOSPITAL_BASED_OUTPATIENT_CLINIC_OR_DEPARTMENT_OTHER)
Admission: RE | Admit: 2012-06-16 | Discharge: 2012-06-16 | Disposition: A | Discharge status: Home / Self Care | Payer: 59 | Source: Ambulatory Visit | Attending: Family | Admitting: Family

## 2012-06-16 ENCOUNTER — Encounter: Payer: Self-pay | Admitting: Family

## 2012-06-16 VITALS — BP 126/90 | HR 93 | Temp 98.3°F | Resp 18 | Wt 175.1 lb

## 2012-06-16 DIAGNOSIS — R29898 Other symptoms and signs involving the musculoskeletal system: Secondary | ICD-10-CM

## 2012-06-16 DIAGNOSIS — N6009 Solitary cyst of unspecified breast: Secondary | ICD-10-CM

## 2012-06-16 DIAGNOSIS — M2420 Disorder of ligament, unspecified site: Secondary | ICD-10-CM | POA: Insufficient documentation

## 2012-06-16 DIAGNOSIS — R42 Dizziness and giddiness: Secondary | ICD-10-CM | POA: Insufficient documentation

## 2012-06-16 DIAGNOSIS — M629 Disorder of muscle, unspecified: Secondary | ICD-10-CM | POA: Insufficient documentation

## 2012-06-16 NOTE — Assessment & Plan Note (Addendum)
Will obtain CT head as she also has some mild left upper extremity weakness.  If negative will refer for MRI of the lumbar spine.

## 2012-06-16 NOTE — Progress Notes (Signed)
Subjective:    Patient ID: Jamie [name], female    DOB: 09-23-1950, 62 y.o.   MRN: [id number]  HPI  Started burning bad in the bottom of the left food.  She feels as though she is dragging her leg. Reports that numbness is up her leg.    Pt reports stiffness, numbness and burning in her left leg x 1 week. Feels like it drags when she walks.  Breast pain- She reports that it feels "like it is trying to come out th nipple." Reports pain os sharp. Started 2 weeks ago. Massage helps. Intermittent.    Review of Systems See HPI  Past Medical History  Diagnosis Date  . Anxiety   . Hyperlipidemia   . Diverticulosis   . IBS (irritable bowel syndrome)   . Depression     History   Social History  . Marital Status: Married    Spouse Name: N/A    Number of Children: 3  . Years of Education: N/A   Occupational History  . Student    Social History Main Topics  . Smoking status: Former Smoker -- 0.8 packs/day for 20 years    Types: Cigarettes    Quit date: 08/13/1985  . Smokeless tobacco: Never Used  . Alcohol Use: Yes     Comment: rare  . Drug Use: No  . Sexually Active: Not on file   Other Topics Concern  . Not on file   Social History Narrative   Lives with husband and grandchild. School full time for paralegalCaffeine use; occasional    Past Surgical History  Procedure Date  . Abdominal hysterectomy   . Tubal ligation   . Hemorrhoid surgery   . Tumor removal     Left thigh     Family History  Problem Relation Age of Onset  . Cancer Mother     renal cell carcinoma  . Diabetes Mother   . Heart disease Father     first MI in 57s  . Cancer Sister     cervical cancer  . Heart disease Brother     first MI in 96s  . Alcohol abuse Sister   . Fibromyalgia Sister   . Arthritis Sister     osteoarthritis  . Irritable bowel syndrome Sister   . Leukemia      grandson    Allergies  Allergen Reactions  . Ciprofloxacin     Facial numbness  . Peanut Butter  Flavor     N/V  . Shellfish Allergy Swelling    Current Outpatient Prescriptions on File Prior to Visit  Medication Sig Dispense Refill  . bismuth subsalicylate (PEPTO-BISMOL) 262 MG/15ML suspension Take 15 mLs by mouth as needed.       . calcium carbonate (TUMS - DOSED IN MG ELEMENTAL CALCIUM) 500 MG chewable tablet Chew 2 tablets by mouth daily.      Marland Kitchen doxycycline (VIBRA-TABS) 100 MG tablet Take 1 tablet (100 mg total) by mouth 2 (two) times daily.  14 tablet  0  . nitroGLYCERIN (NITROSTAT) 0.4 MG SL tablet 0.4 mg every 5 (five) minutes as needed. For chest pain      . NON FORMULARY Take 1 scoop by mouth daily as needed. [name] FIBER  Mix 1 scoop in water daily.      . pantoprazole (PROTONIX) 40 MG tablet Take 1 tablet (40 mg total) by mouth 2 (two) times daily before a meal.  60 tablet  2  . simvastatin (ZOCOR) 40 MG  tablet Take 1 tablet (40 mg total) by mouth at bedtime.  30 tablet  2    BP 126/90  Pulse 93  Temp 98.3 F (36.8 C) (Oral)  Resp 18  Wt 175 lb 1.3 oz (79.416 kg)  SpO2 98%       Objective:   Physical Exam  Constitutional: She is oriented to person, place, and time. She appears well-developed and well-nourished.  Cardiovascular: Normal rate and regular rhythm.   No murmur heard. Pulses:      Dorsalis pedis pulses are 2+ on the right side, and 2+ on the left side.       Posterior tibial pulses are 2+ on the right side, and 2+ on the left side.  Pulmonary/Chest: Effort normal and breath sounds normal. No respiratory distress. She has no wheezes. She has no rales. She exhibits no tenderness.  Neurological: She is alert and oriented to person, place, and time. Coordination normal.       LLE strength is 4-5/5 LUE strength is very mildly diminished + facial symmetry Speech is clear  Breast: Left breast + tenderness ateral to left side nipple.         Assessment & Plan:

## 2012-06-16 NOTE — Assessment & Plan Note (Signed)
Documented cysts on Mammogram 4/13 with recommendation for 1 year follow up.  Cysts are likely cause for mastalgia.  Monitor.

## 2012-06-16 NOTE — Patient Instructions (Addendum)
Please complete your CT on the first floor this evening.  Go to ER if you develop worsening numbness/weakness, facial droop, slurred speech.  Follow up in 2 weeks.

## 2012-06-24 ENCOUNTER — Telehealth: Payer: Self-pay | Admitting: *Deleted

## 2012-06-24 ENCOUNTER — Ambulatory Visit (HOSPITAL_BASED_OUTPATIENT_CLINIC_OR_DEPARTMENT_OTHER)
Admission: RE | Admit: 2012-06-24 | Discharge: 2012-06-24 | Disposition: A | Discharge status: Home / Self Care | Payer: 59 | Source: Ambulatory Visit | Attending: Family | Admitting: Family

## 2012-06-24 DIAGNOSIS — M51379 Other intervertebral disc degeneration, lumbosacral region without mention of lumbar back pain or lower extremity pain: Secondary | ICD-10-CM | POA: Insufficient documentation

## 2012-06-24 DIAGNOSIS — E785 Hyperlipidemia, unspecified: Secondary | ICD-10-CM

## 2012-06-24 DIAGNOSIS — M47817 Spondylosis without myelopathy or radiculopathy, lumbosacral region: Secondary | ICD-10-CM | POA: Insufficient documentation

## 2012-06-24 DIAGNOSIS — M549 Dorsalgia, unspecified: Secondary | ICD-10-CM

## 2012-06-24 DIAGNOSIS — M5137 Other intervertebral disc degeneration, lumbosacral region: Secondary | ICD-10-CM | POA: Insufficient documentation

## 2012-06-24 DIAGNOSIS — M4120 Other idiopathic scoliosis, site unspecified: Secondary | ICD-10-CM | POA: Insufficient documentation

## 2012-06-24 NOTE — Telephone Encounter (Signed)
Notified pt of instructions below and she voices understanding. States that she will wait to schedule appt for urinary complaints below. Pt stated that she would be in one day this week to have cholesterol and labs checked.  Please advise what labs pt needs and dx codes?

## 2012-06-24 NOTE — Telephone Encounter (Signed)
Received message from pt requesting to have her thyroid checked as she stays hungry all the time, wants testing for renal cancer as pt states she can see ?sediment in her urine and it seems to have an unusual odor. Pt's mother was also diagnosed with renal cancer around this same age. Pt also wants to know the status of the MRI for her back?  Please advise.

## 2012-06-24 NOTE — Telephone Encounter (Signed)
I have placed MRI order.  I would recommend that she follow back up in office for her urinary complaints.  Kidneys were included on her CT abdomen in September and there were no abnormalities noted to suggest renal cell carcinoma. Thyroid was checked in June and it was normal.

## 2012-06-25 NOTE — Telephone Encounter (Signed)
Future order entered and copy given to the lab. 

## 2012-06-25 NOTE — Telephone Encounter (Signed)
LFT, FLP (Hyperlipidemia) please.

## 2012-06-26 ENCOUNTER — Telehealth: Payer: Self-pay | Admitting: *Deleted

## 2012-06-26 DIAGNOSIS — M79606 Pain in leg, unspecified: Secondary | ICD-10-CM

## 2012-06-26 NOTE — Telephone Encounter (Signed)
Notified pt of results and she voices understanding. Pt states she would like to have an ultrasound done of her leg to make sure she doesn't have a blood clot before proceeding with Physical Therapy. Pt denies swelling of foot/leg at this time but states the pain, numbness and tingling have been going on for some time and she just wants to make sure.  Please advise.

## 2012-06-26 NOTE — Telephone Encounter (Signed)
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 She also has scoliosis. None of the nerves appear pinched.  I think she would benefit from PT if she is agreeable.

## 2012-07-01 ENCOUNTER — Ambulatory Visit: Payer: Self-pay | Admitting: Family

## 2012-07-01 ENCOUNTER — Ambulatory Visit (HOSPITAL_BASED_OUTPATIENT_CLINIC_OR_DEPARTMENT_OTHER)
Admission: RE | Admit: 2012-07-01 | Discharge: 2012-07-01 | Disposition: A | Discharge status: Home / Self Care | Payer: Self-pay | Source: Ambulatory Visit | Attending: Family | Admitting: Family

## 2012-07-01 DIAGNOSIS — M79606 Pain in leg, unspecified: Secondary | ICD-10-CM

## 2012-07-01 DIAGNOSIS — M79609 Pain in unspecified limb: Secondary | ICD-10-CM | POA: Insufficient documentation

## 2012-07-01 DIAGNOSIS — R209 Unspecified disturbances of skin sensation: Secondary | ICD-10-CM | POA: Insufficient documentation

## 2012-07-15 ENCOUNTER — Encounter: Payer: Self-pay | Admitting: Family

## 2012-07-15 ENCOUNTER — Ambulatory Visit (INDEPENDENT_AMBULATORY_CARE_PROVIDER_SITE_OTHER): Payer: Self-pay | Admitting: Family

## 2012-07-15 VITALS — BP 116/80 | HR 94 | Temp 98.1°F | Resp 16 | Wt 176.1 lb

## 2012-07-15 DIAGNOSIS — R0602 Shortness of breath: Secondary | ICD-10-CM

## 2012-07-15 DIAGNOSIS — M545 Low back pain, unspecified: Secondary | ICD-10-CM

## 2012-07-15 DIAGNOSIS — R29898 Other symptoms and signs involving the musculoskeletal system: Secondary | ICD-10-CM

## 2012-07-15 DIAGNOSIS — F341 Dysthymic disorder: Secondary | ICD-10-CM

## 2012-07-15 NOTE — Patient Instructions (Addendum)
You will be contacted about your referral to cardiology, neurology and physical therapy. Please follow up in 6 weeks.

## 2012-07-15 NOTE — Assessment & Plan Note (Signed)
Clinically stable. Monitor. 

## 2012-07-15 NOTE — Progress Notes (Signed)
Subjective:    Patient ID: Jamie [name], female    DOB: 1950-08-25, 61 y.o.   MRN: [id number]  HPI  Jamie [name] is a 61 yr old female who presents today for follow up.  She reports that she continues to have burning in the left foot.  Left leg feels heavy.  She reports that the left arm is numb and heavy.  She reports + pain in the lower and upper back. Has been seeing chiropracter twice a week without improvement.    SOB- with exertion.  Denies associated chest pain.    Anxiety/Depression- reports that things are going better with her husband. She feels "much better."    Review of Systems See HPI  Past Medical History  Diagnosis Date  . Anxiety   . Hyperlipidemia   . Diverticulosis   . IBS (irritable bowel syndrome)   . Depression     History   Social History  . Marital Status: Married    Spouse Name: N/A    Number of Children: 3  . Years of Education: N/A   Occupational History  . Student    Social History Main Topics  . Smoking status: Former Smoker -- 0.8 packs/day for 20 years    Types: Cigarettes    Quit date: 08/13/1985  . Smokeless tobacco: Never Used  . Alcohol Use: Yes     Comment: rare  . Drug Use: No  . Sexually Active: Not on file   Other Topics Concern  . Not on file   Social History Narrative   Lives with husband and grandchild. School full time for paralegalCaffeine use; occasional    Past Surgical History  Procedure Date  . Abdominal hysterectomy   . Tubal ligation   . Hemorrhoid surgery   . Tumor removal     Left thigh     Family History  Problem Relation Age of Onset  . Cancer Mother     renal cell carcinoma  . Diabetes Mother   . Heart disease Father     first MI in 70s  . Cancer Sister     cervical cancer  . Heart disease Brother     first MI in 61s  . Alcohol abuse Sister   . Fibromyalgia Sister   . Arthritis Sister     osteoarthritis  . Irritable bowel syndrome Sister   . Leukemia      grandson    Allergies   Allergen Reactions  . Ciprofloxacin     Facial numbness  . Peanut Butter Flavor     N/V  . Shellfish Allergy Swelling    Current Outpatient Prescriptions on File Prior to Visit  Medication Sig Dispense Refill  . bismuth subsalicylate (PEPTO-BISMOL) 262 MG/15ML suspension Take 15 mLs by mouth as needed.       . calcium carbonate (TUMS - DOSED IN MG ELEMENTAL CALCIUM) 500 MG chewable tablet Chew 2 tablets by mouth daily.      . nitroGLYCERIN (NITROSTAT) 0.4 MG SL tablet 0.4 mg every 5 (five) minutes as needed. For chest pain      . NON FORMULARY Take 1 scoop by mouth daily as needed. [name] FIBER  Mix 1 scoop in water daily.      . pantoprazole (PROTONIX) 40 MG tablet Take 1 tablet (40 mg total) by mouth 2 (two) times daily before a meal.  60 tablet  2  . simvastatin (ZOCOR) 40 MG tablet Take 1 tablet (40 mg total) by mouth at bedtime.  30 tablet  2    BP 116/80  Pulse 94  Temp 98.1 F (36.7 C) (Oral)  Resp 16  Wt 176 lb 1.9 oz (79.888 kg)  SpO2 99%       Objective:   Physical Exam  Constitutional: She appears well-developed and well-nourished.  Cardiovascular: Normal rate and regular rhythm.   No murmur heard. Pulses:      Dorsalis pedis pulses are 2+ on the right side, and 2+ on the left side.  Pulmonary/Chest: Effort normal and breath sounds normal. No respiratory distress. She has no wheezes. She has no rales. She exhibits no tenderness.  Neurological:  Reflex Scores:      Patellar reflexes are 2+ on the right side and 2+ on the left side.      LLE strength is 4-5/5, RLE strength 5/5. Bilateral UE strength is 5/5 +facial symmetry.          Assessment & Plan:

## 2012-07-15 NOTE — Assessment & Plan Note (Signed)
Refer for PT.  Will also refer to neurosurgery for further evaluation.

## 2012-07-15 NOTE — Assessment & Plan Note (Signed)
Pt with SOB on exertion.  This is not a new problem. She saw pulmonology early 2013 and underwent spirometry- findings were consistent with asthma.  She has hx of atypical CP and had a stress test 2011 which was negative. EKG is performed today reviewed.  EKG- NSR without acute changes.  Patient remains concerned that her symptoms are cardiac in nature.  She would like referral to cardiology. Will arrange.

## 2012-07-16 ENCOUNTER — Encounter: Payer: Self-pay | Admitting: Family

## 2012-07-16 DIAGNOSIS — Z0289 Encounter for other administrative examinations: Secondary | ICD-10-CM

## 2012-07-17 ENCOUNTER — Ambulatory Visit: Payer: 59 | Attending: Family | Admitting: Rehabilitation

## 2012-07-18 ENCOUNTER — Encounter: Payer: Self-pay | Admitting: Family

## 2012-07-18 ENCOUNTER — Telehealth: Payer: Self-pay | Admitting: *Deleted

## 2012-07-18 ENCOUNTER — Ambulatory Visit (INDEPENDENT_AMBULATORY_CARE_PROVIDER_SITE_OTHER): Payer: 59 | Admitting: Family

## 2012-07-18 VITALS — BP 100/70 | HR 80 | Temp 97.7°F | Resp 16 | Ht 65.5 in | Wt 176.0 lb

## 2012-07-18 DIAGNOSIS — Z0000 Encounter for general adult medical examination without abnormal findings: Secondary | ICD-10-CM

## 2012-07-18 DIAGNOSIS — J40 Bronchitis, not specified as acute or chronic: Secondary | ICD-10-CM

## 2012-07-18 DIAGNOSIS — M2550 Pain in unspecified joint: Secondary | ICD-10-CM

## 2012-07-18 DIAGNOSIS — F418 Other specified anxiety disorders: Secondary | ICD-10-CM

## 2012-07-18 DIAGNOSIS — J209 Acute bronchitis, unspecified: Secondary | ICD-10-CM | POA: Insufficient documentation

## 2012-07-18 DIAGNOSIS — F341 Dysthymic disorder: Secondary | ICD-10-CM

## 2012-07-18 LAB — CBC WITH DIFFERENTIAL/PLATELET
Basophils Absolute: 0 10*3/uL (ref 0.0–0.1)
Eosinophils Relative: 1 % (ref 0–5)
HCT: 39.3 % (ref 36.0–46.0)
Lymphocytes Relative: 29 % (ref 12–46)
Lymphs Abs: 1.2 10*3/uL (ref 0.7–4.0)
MCV: 91.4 fL (ref 78.0–100.0)
Monocytes Absolute: 0.5 10*3/uL (ref 0.1–1.0)
Neutro Abs: 2.4 10*3/uL (ref 1.7–7.7)
RBC: 4.3 MIL/uL (ref 3.87–5.11)
RDW: 13.4 % (ref 11.5–15.5)
WBC: 4.2 10*3/uL (ref 4.0–10.5)

## 2012-07-18 LAB — HEPATIC FUNCTION PANEL
ALT: 21 U/L (ref 0–35)
AST: 21 U/L (ref 0–37)
Albumin: 4.5 g/dL (ref 3.5–5.2)
Alkaline Phosphatase: 48 U/L (ref 39–117)
Indirect Bilirubin: 0.4 mg/dL (ref 0.0–0.9)
Total Protein: 6.8 g/dL (ref 6.0–8.3)

## 2012-07-18 LAB — LIPID PANEL: LDL Cholesterol: 154 mg/dL — ABNORMAL HIGH (ref 0–99)

## 2012-07-18 MED ORDER — ALPRAZOLAM 0.5 MG PO TABS: 0.5000 mg | ORAL_TABLET | Freq: Two times a day (BID) | ORAL | Status: DC | PRN

## 2012-07-18 MED ORDER — BENZONATATE 100 MG PO CAPS: 100.0000 mg | ORAL_CAPSULE | Freq: Three times a day (TID) | ORAL | Status: AC | PRN

## 2012-07-18 MED ORDER — AZITHROMYCIN 250 MG PO TABS: ORAL_TABLET | ORAL | Status: DC

## 2012-07-18 MED ORDER — PANTOPRAZOLE SODIUM 40 MG PO TBEC: 40.0000 mg | DELAYED_RELEASE_TABLET | Freq: Two times a day (BID) | ORAL | Status: DC

## 2012-07-18 MED ORDER — ALPRAZOLAM 0.5 MG PO TBDP: 0.5000 mg | ORAL_TABLET | Freq: Two times a day (BID) | ORAL | Status: DC | PRN

## 2012-07-18 NOTE — Telephone Encounter (Signed)
Due to a mix up of lab orders today pt did not have complete CPE orders completed today. Since pt was fasting we will send specimen for lipid panel, hepatic function panel and CBC. Spoke with pt and she will return Monday for u/a, BMP, TSH and celiac panel. Future order given to the lab.

## 2012-07-18 NOTE — Patient Instructions (Addendum)
Please complete blood work prior to leaving.  Schedule your bone density test at the front desk. Follow up in 6 months.

## 2012-07-18 NOTE — Assessment & Plan Note (Signed)
61 yr old female presents today for cpx.  Discussed exercise, weight loss.  Declines flu shot.  Mammo up to date.  Schedule dexa, obtain fasting lab work.

## 2012-07-18 NOTE — Assessment & Plan Note (Signed)
Rx with tessalon and z-pak.

## 2012-07-18 NOTE — Progress Notes (Signed)
Subjective:    Patient ID: Jamie [name], female    DOB: Dec 11, 1950, 61 y.o.   MRN: [id number]  HPI  Patient presents today for complete physical.  Immunizations: last tetanus in 2007- declines flu shot.  Diet: Reports diet is healthy Exercise: She plans to start exercise routine.   Colonoscopy: had colo in 2009 Dexa: Never Pap Smear: hysterectomy- has one ovary. Mammogram: had mammo in April  Anxiety- Reports anxiety is well controlled.  Uses alprazolam prn- usually 2-3 times a week.    Headache reports that she has been coughing up green and sinus pain/pressure. She denies associated fever.     Review of Systems  Constitutional: Negative for unexpected weight change.  HENT: Positive for congestion. Negative for hearing loss.   Eyes: Negative for visual disturbance.  Respiratory: Positive for cough.   Cardiovascular:       Mild ankle swelling  Gastrointestinal: Positive for nausea and constipation. Negative for vomiting.  Genitourinary: Negative for dysuria.  Musculoskeletal:       Bilateral Knee pain/back pain/neck pain  Skin: Negative for rash.  Neurological: Positive for headaches.  Hematological: Negative for adenopathy.  Psychiatric/Behavioral:       Denies depression Anxiety well controlled.   Past Medical History  Diagnosis Date  . Anxiety   . Hyperlipidemia   . Diverticulosis   . IBS (irritable bowel syndrome)   . Depression     History   Social History  . Marital Status: Married    Spouse Name: N/A    Number of Children: 3  . Years of Education: N/A   Occupational History  . Student    Social History Main Topics  . Smoking status: Former Smoker -- 0.8 packs/day for 20 years    Types: Cigarettes    Quit date: 08/13/1985  . Smokeless tobacco: Never Used  . Alcohol Use: Yes     Comment: rare  . Drug Use: No  . Sexually Active: Not on file   Other Topics Concern  . Not on file   Social History Narrative   Lives with husband and grandchild.  School full time for paralegalCaffeine use; occasionalWorks at [employer]    Past Surgical History  Procedure Date  . Abdominal hysterectomy   . Tubal ligation   . Hemorrhoid surgery   . Tumor removal     Left thigh     Family History  Problem Relation Age of Onset  . Cancer Mother     renal cell carcinoma  . Diabetes Mother   . Heart disease Father     first MI in 109s  . Cancer Sister     cervical cancer  . Heart disease Brother     first MI in 23s  . Alcohol abuse Sister   . Fibromyalgia Sister   . Arthritis Sister     osteoarthritis  . Irritable bowel syndrome Sister   . Leukemia      grandson    Allergies  Allergen Reactions  . Ciprofloxacin     Facial numbness  . Peanut Butter Flavor     N/V  . Shellfish Allergy Swelling    Current Outpatient Prescriptions on File Prior to Visit  Medication Sig Dispense Refill  . bismuth subsalicylate (PEPTO-BISMOL) 262 MG/15ML suspension Take 15 mLs by mouth as needed.       . calcium carbonate (TUMS - DOSED IN MG ELEMENTAL CALCIUM) 500 MG chewable tablet Chew 2 tablets by mouth daily.      . nitroGLYCERIN (  NITROSTAT) 0.4 MG SL tablet 0.4 mg every 5 (five) minutes as needed. For chest pain      . NON FORMULARY Take 1 scoop by mouth daily as needed. [name] FIBER  Mix 1 scoop in water daily.      . simvastatin (ZOCOR) 40 MG tablet Take 1 tablet (40 mg total) by mouth at bedtime.  30 tablet  2  . [DISCONTINUED] pantoprazole (PROTONIX) 40 MG tablet Take 1 tablet (40 mg total) by mouth 2 (two) times daily before a meal.  60 tablet  2    BP 100/70  Pulse 80  Temp 97.7 F (36.5 C) (Oral)  Resp 16  Ht 5' 5.5" (1.664 m)  Wt 176 lb (79.833 kg)  BMI 28.84 kg/m2  SpO2 99%       Objective:   Physical Exam  Physical Exam  Constitutional: She is oriented to person, place, and time. She appears well-developed and well-nourished. No distress.  HENT:  Head: Normocephalic and atraumatic.  Right Ear: Tympanic membrane and  ear canal normal.  Left Ear: Tympanic membrane and ear canal normal.  Mouth/Throat: Oropharynx is clear and moist.  Eyes: Pupils are equal, round, and reactive to light. No scleral icterus.  Neck: Normal range of motion. No thyromegaly present.  Cardiovascular: Normal rate and regular rhythm.   No murmur heard. Pulmonary/Chest: Effort normal and breath sounds normal. No respiratory distress. He has no wheezes. She has no rales. She exhibits no tenderness.  Abdominal: Soft. Bowel sounds are normal. He exhibits no distension and no mass. There is no tenderness. There is no rebound and no guarding.  Musculoskeletal: She exhibits no edema.  Lymphadenopathy:    She has no cervical adenopathy.  Neurological: She is alert and oriented to person, place, and time.  She exhibits normal muscle tone. Coordination normal.  Skin: Skin is warm and dry.  Psychiatric: She has a normal mood and affect. Her behavior is normal. Judgment and thought content normal.  Breasts: Examined lying Right: Without masses, retractions, discharge or axillary adenopathy.  Left: Without masses, retractions, discharge or axillary adenopathy.  Inguinal/mons: Normal without inguinal adenopathy  External genitalia: Normal  BUS/Urethra/Skene's glands: Normal  Bladder: Normal  Vagina:normal Cervix: surgically absent Uterus: surgically absent Adnexa/parametria:  Rt: Without masses or tenderness.  Lt: Without masses or tenderness.  Anus and perineum: Normal           Assessment & Plan:         Assessment & Plan:

## 2012-07-18 NOTE — Addendum Note (Signed)
Addended by: Mervin Kung A on: 07/18/2012 11:02 AM   Modules accepted: Orders

## 2012-07-18 NOTE — Assessment & Plan Note (Signed)
Stable. Alprazolam refill provided today.

## 2012-07-21 ENCOUNTER — Inpatient Hospital Stay: Admission: RE | Admit: 2012-07-21 | Payer: 59 | Source: Ambulatory Visit

## 2012-07-23 ENCOUNTER — Telehealth: Payer: Self-pay | Admitting: *Deleted

## 2012-07-23 ENCOUNTER — Inpatient Hospital Stay: Admission: RE | Admit: 2012-07-23 | Payer: 59 | Source: Ambulatory Visit

## 2012-07-23 DIAGNOSIS — R29898 Other symptoms and signs involving the musculoskeletal system: Secondary | ICD-10-CM

## 2012-07-23 NOTE — Telephone Encounter (Signed)
Received call from pt stating she just left the neurosurgeon's office and he told her that "he could not help her.  Her issues are not coming from her back and that she should not have been referred to him." Pt is upset and states that we should have referred her to a neurologist.  Please advise.

## 2012-07-23 NOTE — Telephone Encounter (Signed)
Spoke with patient.  Reviewed with her that I chose to send her to neurosurgery first due to her symptoms (leg weakness/foot burning) given the finding of degenerative disc disease and bulging discs on MRI.  I wanted their opinion if they thought she could be helped by surgery or steroid injection.  Expressed to her that I am sorry that they were unable to help her but that their opinion is still valuable. At this point, will proceed with neurology referral.  Pt expresses understanding and would like to proceed with neurology referral.

## 2012-07-24 ENCOUNTER — Inpatient Hospital Stay: Admission: RE | Admit: 2012-07-24 | Payer: 59 | Source: Ambulatory Visit

## 2012-07-25 ENCOUNTER — Telehealth: Payer: Self-pay | Admitting: Family

## 2012-07-25 DIAGNOSIS — F419 Anxiety disorder, unspecified: Secondary | ICD-10-CM

## 2012-07-25 NOTE — Telephone Encounter (Signed)
Patient states that she would like to be referred to someone for counseling. She says that she would like to go to the [facility] location

## 2012-08-20 ENCOUNTER — Ambulatory Visit (INDEPENDENT_AMBULATORY_CARE_PROVIDER_SITE_OTHER): Payer: Self-pay | Admitting: Cardiology

## 2012-08-20 ENCOUNTER — Encounter: Payer: Self-pay | Admitting: Cardiology

## 2012-08-20 VITALS — BP 136/94 | HR 84 | Ht 65.0 in | Wt 175.0 lb

## 2012-08-20 DIAGNOSIS — R0602 Shortness of breath: Secondary | ICD-10-CM

## 2012-08-20 DIAGNOSIS — R079 Chest pain, unspecified: Secondary | ICD-10-CM

## 2012-08-20 DIAGNOSIS — E785 Hyperlipidemia, unspecified: Secondary | ICD-10-CM

## 2012-08-20 NOTE — Progress Notes (Signed)
HPI: 62 year old female for fu of dyspnea. Admitted in December of 2011 with dyspnea and chest pain. Seen by Dr Clifton James. Echocardiogram in December of 2011 showed an ejection fraction of 55-60%. There was grade 1 diastolic dysfunction. Myoview in Dec 2011 normal with EF 73. Symptoms of dyspnea and chest pain persisted. Cardiac CT in December of 2011 showed a calcium score of 0. There was no coronary disease. Seen by pulmonary previously and spirometry felt consistent with asthma.  Venous Dopplers in November of 2013 showed no DVT. Hemoglobin in December 2013 normal. Patient describes dyspnea for approximately 3 years. It predominantly occurs with exertion. Question orthopnea but no PND and no pedal edema. She also has occasional chest pain. It occurs both with exertion and at rest. There is an associated numbness in her shoulders, teeth and down to her feet. Her symptoms last approximately 4 minutes and resolve spontaneously. She also complains of back pain that increases with movement.  Current Outpatient Prescriptions  Medication Sig Dispense Refill  . ALPRAZolam (XANAX) 0.5 MG tablet Take 1 tablet (0.5 mg total) by mouth 2 (two) times daily as needed for sleep.  30 tablet  0  . bismuth subsalicylate (PEPTO-BISMOL) 262 MG/15ML suspension Take 15 mLs by mouth as needed.       . calcium carbonate (TUMS - DOSED IN MG ELEMENTAL CALCIUM) 500 MG chewable tablet Chew 2 tablets by mouth daily.      . nitroGLYCERIN (NITROSTAT) 0.4 MG SL tablet 0.4 mg every 5 (five) minutes as needed. For chest pain      . NON FORMULARY Take 1 scoop by mouth daily as needed. [name] FIBER  Mix 1 scoop in water daily.      . pantoprazole (PROTONIX) 40 MG tablet Take 1 tablet (40 mg total) by mouth 2 (two) times daily before a meal.  60 tablet  5  . simvastatin (ZOCOR) 40 MG tablet Take 1 tablet (40 mg total) by mouth at bedtime.  30 tablet  2    Allergies  Allergen Reactions  . Ciprofloxacin     Facial numbness  .  Peanut Butter Flavor     N/V  . Shellfish Allergy Swelling    Past Medical History  Diagnosis Date  . Anxiety   . Hyperlipidemia   . Diverticulosis   . IBS (irritable bowel syndrome)   . Depression     Past Surgical History  Procedure Date  . Abdominal hysterectomy   . Tubal ligation   . Hemorrhoid surgery   . Tumor removal     Left thigh     History   Social History  . Marital Status: Married    Spouse Name: N/A    Number of Children: 3  . Years of Education: N/A   Occupational History  . Student    Social History Main Topics  . Smoking status: Former Smoker -- 0.8 packs/day for 20 years    Types: Cigarettes    Quit date: 08/13/1985  . Smokeless tobacco: Never Used  . Alcohol Use: Yes     Comment: rare  . Drug Use: No  . Sexually Active: Not on file   Other Topics Concern  . Not on file   Social History Narrative   Lives with husband and grandchild. School full time for paralegalCaffeine use; occasionalWorks at [employer]    Family History  Problem Relation Age of Onset  . Cancer Mother     renal cell carcinoma  . Diabetes Mother   .  Heart disease Father     first MI in 6s  . Cancer Sister     cervical cancer  . Heart disease Brother     first MI in 81s  . Alcohol abuse Sister   . Fibromyalgia Sister   . Arthritis Sister     osteoarthritis  . Irritable bowel syndrome Sister   . Leukemia      grandson    ROS: patient describes back pain and numbness but no fevers or chills, productive cough, hemoptysis, dysphasia, odynophagia, melena, hematochezia, dysuria, hematuria, rash, seizure activity, orthopnea, PND, pedal edema, claudication. Remaining systems are negative.  Physical Exam:   Blood pressure 136/94, pulse 84, height 5\' 5"  (1.651 m), weight 175 lb (79.379 kg).  General:  Well developed/well nourished in NAD Skin warm/dry Patient not depressed No peripheral clubbing Back-normal HEENT-normal/normal eyelids Neck supple/normal carotid  upstroke bilaterally; no bruits; no JVD; no thyromegaly chest - CTA/ normal expansion CV - RRR/normal S1 and S2; no murmurs, rubs or gallops;  PMI nondisplaced Abdomen -NT/ND, no HSM, no mass, + bowel sounds, no bruit 2+ femoral pulses, no bruits Ext-no edema, chords, 2+ DP Neuro-grossly nonfocal  ECG 07/15/12 sinus rhythm with no ST changes.

## 2012-08-20 NOTE — Assessment & Plan Note (Signed)
Management per primary care. 

## 2012-08-20 NOTE — Patient Instructions (Addendum)
Your physician recommends that you schedule a follow-up appointment in: AS NEEDED  

## 2012-08-20 NOTE — Assessment & Plan Note (Signed)
Previous cardiac evaluation showed normal LV function and no ischemia. Electrocardiogram shows no ST changes. Previous pulmonary evaluation suggested asthma. She is not volume overloaded on examination. I do not think she needs further evaluation for this issue from a cardiac standpoint.

## 2012-08-20 NOTE — Assessment & Plan Note (Signed)
Symptoms are extremely atypical. Her chest pain is associated with numbness in her shoulders down her legs. Previous cardiac evaluation including functional study was normal. Cardiac CT showed no coronary disease and a calcium score of 0. Electrocardiogram shows no ST changes. I do not think her symptoms are likely to be cardiac. Given her recent negative evaluation I do not need to pursue further ischemia evaluation.

## 2012-08-21 ENCOUNTER — Ambulatory Visit (INDEPENDENT_AMBULATORY_CARE_PROVIDER_SITE_OTHER): Payer: 59 | Admitting: Licensed Clinical Social Worker

## 2012-08-21 DIAGNOSIS — F321 Major depressive disorder, single episode, moderate: Secondary | ICD-10-CM

## 2012-08-28 ENCOUNTER — Ambulatory Visit (INDEPENDENT_AMBULATORY_CARE_PROVIDER_SITE_OTHER): Payer: 59 | Admitting: Licensed Clinical Social Worker

## 2012-08-28 DIAGNOSIS — F321 Major depressive disorder, single episode, moderate: Secondary | ICD-10-CM

## 2012-09-04 ENCOUNTER — Ambulatory Visit: Payer: 59 | Admitting: Licensed Clinical Social Worker

## 2012-09-24 ENCOUNTER — Other Ambulatory Visit: Payer: Self-pay | Admitting: Family

## 2012-09-24 MED ORDER — ALPRAZOLAM 0.5 MG PO TABS: 0.5000 mg | ORAL_TABLET | Freq: Two times a day (BID) | ORAL | Status: DC | PRN

## 2012-09-24 NOTE — Telephone Encounter (Signed)
Alprazolam 0.5mg  1 tablet twice daily as needed for sleep #30 x no refills left on pharmacy voicemail.

## 2012-12-16 ENCOUNTER — Ambulatory Visit: Payer: Self-pay | Admitting: Family

## 2012-12-30 ENCOUNTER — Other Ambulatory Visit: Payer: Self-pay | Admitting: Family

## 2012-12-30 DIAGNOSIS — Z1231 Encounter for screening mammogram for malignant neoplasm of breast: Secondary | ICD-10-CM

## 2012-12-31 ENCOUNTER — Ambulatory Visit (HOSPITAL_BASED_OUTPATIENT_CLINIC_OR_DEPARTMENT_OTHER)
Admission: RE | Admit: 2012-12-31 | Discharge: 2012-12-31 | Disposition: A | Discharge status: Home / Self Care | Payer: 59 | Source: Ambulatory Visit | Attending: Family | Admitting: Family

## 2012-12-31 ENCOUNTER — Ambulatory Visit (HOSPITAL_BASED_OUTPATIENT_CLINIC_OR_DEPARTMENT_OTHER): Payer: Self-pay

## 2012-12-31 DIAGNOSIS — Z1231 Encounter for screening mammogram for malignant neoplasm of breast: Secondary | ICD-10-CM | POA: Insufficient documentation

## 2013-02-04 ENCOUNTER — Ambulatory Visit (INDEPENDENT_AMBULATORY_CARE_PROVIDER_SITE_OTHER): Payer: 59 | Admitting: Family

## 2013-02-04 ENCOUNTER — Encounter: Payer: Self-pay | Admitting: Family

## 2013-02-04 VITALS — BP 106/80 | HR 68 | Temp 97.8°F | Resp 16 | Ht 65.0 in | Wt 163.0 lb

## 2013-02-04 DIAGNOSIS — E785 Hyperlipidemia, unspecified: Secondary | ICD-10-CM

## 2013-02-04 DIAGNOSIS — E559 Vitamin D deficiency, unspecified: Secondary | ICD-10-CM

## 2013-02-04 DIAGNOSIS — E538 Deficiency of other specified B group vitamins: Secondary | ICD-10-CM

## 2013-02-04 LAB — CBC WITH DIFFERENTIAL/PLATELET
Basophils Absolute: 0 10*3/uL (ref 0.0–0.1)
Basophils Relative: 1 % (ref 0–1)
Eosinophils Absolute: 0.1 10*3/uL (ref 0.0–0.7)
Eosinophils Relative: 2 % (ref 0–5)
HCT: 39.1 % (ref 36.0–46.0)
MCHC: 35.3 g/dL (ref 30.0–36.0)
MCV: 86.7 fL (ref 78.0–100.0)
Monocytes Absolute: 0.4 10*3/uL (ref 0.1–1.0)
Platelets: 293 10*3/uL (ref 150–400)
RDW: 12.7 % (ref 11.5–15.5)

## 2013-02-04 LAB — HEPATIC FUNCTION PANEL
ALT: 23 U/L (ref 0–35)
AST: 23 U/L (ref 0–37)
Albumin: 4.5 g/dL (ref 3.5–5.2)
Alkaline Phosphatase: 59 U/L (ref 39–117)
Bilirubin, Direct: 0.1 mg/dL (ref 0.0–0.3)
Indirect Bilirubin: 0.7 mg/dL (ref 0.0–0.9)
Total Bilirubin: 0.8 mg/dL (ref 0.3–1.2)
Total Protein: 7.3 g/dL (ref 6.0–8.3)

## 2013-02-04 LAB — LIPID PANEL
Cholesterol: 239 mg/dL — ABNORMAL HIGH (ref 0–200)
HDL: 51 mg/dL (ref >39)
LDL Cholesterol: 159 mg/dL — ABNORMAL HIGH (ref 0–99)
Total CHOL/HDL Ratio: 4.7 Ratio
Triglycerides: 144 mg/dL (ref <150)
VLDL: 29 mg/dL (ref 0–40)

## 2013-02-04 LAB — VITAMIN B12: Vitamin B-12: 786 pg/mL (ref 211–911)

## 2013-02-04 MED ORDER — ALPRAZOLAM 0.5 MG PO TABS: 0.5000 mg | ORAL_TABLET | Freq: Two times a day (BID) | ORAL | Status: DC | PRN

## 2013-02-04 MED ORDER — PANTOPRAZOLE SODIUM 40 MG PO TBEC: 40.0000 mg | DELAYED_RELEASE_TABLET | Freq: Two times a day (BID) | ORAL | Status: DC

## 2013-02-04 NOTE — Patient Instructions (Addendum)
Please complete lab work prior to leaving. You can schedule b12 injections at our office- once weekly for 5 more weeks then once monthly.

## 2013-02-04 NOTE — Assessment & Plan Note (Signed)
Told Pt. OK to get shots here.  She will continue once weekly for 5 more weeks, then once monthly. Labs have been drawn at pt request.

## 2013-02-04 NOTE — Progress Notes (Signed)
  Subjective:    Patient ID: Jamie [name], female    DOB: 06-Apr-1951, 63 y.o.   MRN: [id number]  HPI  Jamie [name] is a 62 yr old female who presents today to discuss lab work.   She brings with her today some lab work from the neurologist- notes mild elevation of b6, low normal b12.  She wants these tests repeated along with vitamin D cbc, lipid panel and LFT.  The pt would like to have her b12 shots given at our clinic as it is more convenient for her.      Review of Systems     Objective:   Physical Exam  Constitutional: She is oriented to person, place, and time. She appears well-developed and well-nourished. No distress.  Cardiovascular: Normal rate and regular rhythm.   No murmur heard. Pulmonary/Chest: Effort normal and breath sounds normal. No respiratory distress. She has no wheezes. She has no rales. She exhibits no tenderness.  Neurological: She is alert and oriented to person, place, and time.  Psychiatric: She has a normal mood and affect. Her behavior is normal. Judgment and thought content normal.          Assessment & Plan:

## 2013-02-05 LAB — VITAMIN D 25 HYDROXY (VIT D DEFICIENCY, FRACTURES): Vit D, 25-Hydroxy: 37 ng/mL (ref 30–89)

## 2013-02-06 ENCOUNTER — Telehealth: Payer: Self-pay | Admitting: *Deleted

## 2013-02-06 NOTE — Telephone Encounter (Signed)
Received message from pt requesting lab results.  Please advise.

## 2013-02-09 ENCOUNTER — Ambulatory Visit: Payer: Self-pay

## 2013-02-09 LAB — VITAMIN B6: Vitamin B6: 64.6 ng/mL — ABNORMAL HIGH (ref 2.1–21.7)

## 2013-02-09 NOTE — Telephone Encounter (Signed)
Left detailed message on home # and to call back tomorrow with response.

## 2013-02-09 NOTE — Telephone Encounter (Signed)
b12 has come up to normal after her injection.  We can put her on monthly injection schedule please. B6 still pending.  Vitamin D normal.  Liver function normal.  Cholesterol elevated- work on low fat/low cholesterol diet, exercise, weight loss. Is she taking simvastatin daily?  If so, I will increase to 80mg  once daily as cholesterol is high.

## 2013-02-12 NOTE — Telephone Encounter (Signed)
Attempted to reach pt, unable to leave message as "mailbox is full".  Letter mailed.

## 2013-02-12 NOTE — Progress Notes (Signed)
Mailed letter °

## 2013-04-30 ENCOUNTER — Encounter: Payer: Self-pay | Admitting: Physician Assistant

## 2013-04-30 ENCOUNTER — Ambulatory Visit (INDEPENDENT_AMBULATORY_CARE_PROVIDER_SITE_OTHER): Payer: 59 | Admitting: Physician Assistant

## 2013-04-30 VITALS — BP 112/72 | HR 96 | Temp 98.1°F | Resp 14 | Ht 65.0 in | Wt 156.5 lb

## 2013-04-30 DIAGNOSIS — E538 Deficiency of other specified B group vitamins: Secondary | ICD-10-CM

## 2013-04-30 DIAGNOSIS — K5790 Diverticulosis of intestine, part unspecified, without perforation or abscess without bleeding: Secondary | ICD-10-CM

## 2013-04-30 DIAGNOSIS — J019 Acute sinusitis, unspecified: Secondary | ICD-10-CM

## 2013-04-30 DIAGNOSIS — J0120 Acute ethmoidal sinusitis, unspecified: Secondary | ICD-10-CM | POA: Insufficient documentation

## 2013-04-30 DIAGNOSIS — J329 Chronic sinusitis, unspecified: Secondary | ICD-10-CM

## 2013-04-30 DIAGNOSIS — K5730 Diverticulosis of large intestine without perforation or abscess without bleeding: Secondary | ICD-10-CM

## 2013-04-30 DIAGNOSIS — E531 Pyridoxine deficiency: Secondary | ICD-10-CM

## 2013-04-30 MED ORDER — AMOXICILLIN-POT CLAVULANATE 875-125 MG PO TABS: 1.0000 | ORAL_TABLET | Freq: Two times a day (BID) | ORAL | Status: DC

## 2013-04-30 MED ORDER — METRONIDAZOLE 500 MG PO TABS: 500.0000 mg | ORAL_TABLET | Freq: Three times a day (TID) | ORAL | Status: DC

## 2013-04-30 NOTE — Assessment & Plan Note (Signed)
CBC, BMP ordered.  Patient instructed liquid diet.  Ibuprofen for pain.  Rx Augmentin and Flagyl due to history.  Patient to return if symptoms worsen or to ED if symptoms worsen and are severe.

## 2013-04-30 NOTE — Assessment & Plan Note (Signed)
Rx Augmentin.  Increase fluids.  Rest.  Daily claritin.  Saline nasal spray

## 2013-04-30 NOTE — Patient Instructions (Signed)
For sinus infection -- take medication as prescribed.  Increase fluid intake.  Rest.  Saline Nasal Spray.    For pain -- Ibuprofen as needed.  Obtain labs.  Take Augmentin and Metronidazole as prescribed.  Liquid only diet for the next few days.  Fiber supplement.  Sinusitis Sinusitis is redness, soreness, and swelling (inflammation) of the paranasal sinuses. Paranasal sinuses are air pockets within the bones of your face (beneath the eyes, the middle of the forehead, or above the eyes). In healthy paranasal sinuses, mucus is able to drain out, and air is able to circulate through them by way of your nose. However, when your paranasal sinuses are inflamed, mucus and air can become trapped. This can allow bacteria and other germs to grow and cause infection. Sinusitis can develop quickly and last only a short time (acute) or continue over a long period (chronic). Sinusitis that lasts for more than 12 weeks is considered chronic.  CAUSES  Causes of sinusitis include:  Allergies.  Structural abnormalities, such as displacement of the cartilage that separates your nostrils (deviated septum), which can decrease the air flow through your nose and sinuses and affect sinus drainage.  Functional abnormalities, such as when the small hairs (cilia) that line your sinuses and help remove mucus do not work properly or are not present. SYMPTOMS  Symptoms of acute and chronic sinusitis are the same. The primary symptoms are pain and pressure around the affected sinuses. Other symptoms include:  Upper toothache.  Earache.  Headache.  Bad breath.  Decreased sense of smell and taste.  A cough, which worsens when you are lying flat.  Fatigue.  Fever.  Thick drainage from your nose, which often is green and may contain pus (purulent).  Swelling and warmth over the affected sinuses. DIAGNOSIS  Your caregiver will perform a physical exam. During the exam, your caregiver may:  Look in your nose for  signs of abnormal growths in your nostrils (nasal polyps).  Tap over the affected sinus to check for signs of infection.  View the inside of your sinuses (endoscopy) with a special imaging device with a light attached (endoscope), which is inserted into your sinuses. If your caregiver suspects that you have chronic sinusitis, one or more of the following tests may be recommended:  Allergy tests.  Nasal culture A sample of mucus is taken from your nose and sent to a lab and screened for bacteria.  Nasal cytology A sample of mucus is taken from your nose and examined by your caregiver to determine if your sinusitis is related to an allergy. TREATMENT  Most cases of acute sinusitis are related to a viral infection and will resolve on their own within 10 days. Sometimes medicines are prescribed to help relieve symptoms (pain medicine, decongestants, nasal steroid sprays, or saline sprays).  However, for sinusitis related to a bacterial infection, your caregiver will prescribe antibiotic medicines. These are medicines that will help kill the bacteria causing the infection.  Rarely, sinusitis is caused by a fungal infection. In theses cases, your caregiver will prescribe antifungal medicine. For some cases of chronic sinusitis, surgery is needed. Generally, these are cases in which sinusitis recurs more than 3 times per year, despite other treatments. HOME CARE INSTRUCTIONS   Drink plenty of water. Water helps thin the mucus so your sinuses can drain more easily.  Use a humidifier.  Inhale steam 3 to 4 times a day (for example, sit in the bathroom with the shower running).  Apply a  warm, moist washcloth to your face 3 to 4 times a day, or as directed by your caregiver.  Use saline nasal sprays to help moisten and clean your sinuses.  Take over-the-counter or prescription medicines for pain, discomfort, or fever only as directed by your caregiver. SEEK IMMEDIATE MEDICAL CARE IF:  You have  increasing pain or severe headaches.  You have nausea, vomiting, or drowsiness.  You have swelling around your face.  You have vision problems.  You have a stiff neck.  You have difficulty breathing. MAKE SURE YOU:   Understand these instructions.  Will watch your condition.  Will get help right away if you are not doing well or get worse. Document Released: 07/30/2005 Document Revised: 10/22/2011 Document Reviewed: 08/14/2011 [facility] Patient Information 2014 [place], [place].

## 2013-04-30 NOTE — Progress Notes (Signed)
Patient ID: Jamie [name], female   DOB: 06-26-1951, 62 y.o.   MRN: [id number]  Patient presents to clinic today c/o headache, sinus pain, pressure, nasal discharge x 2 weeks. Patient denies fever, chills sweats.  Denies tooth pain.  Denies N/V/C/D.  Denies recent sick contact.  Patient also notes some ear congestion.  Patient also with history of diverticular disease and c/o LLQ, RLQ pain since last night.  Patient endorses eating a lot of popcorn over the past couple of days, which she states she knows she shouldn't do.  As above, denies N/V/C/D.  Has continued with her regular diet.  Has not taken anything for pain.  Past Medical History  Diagnosis Date  . Anxiety   . Hyperlipidemia   . Diverticulosis   . IBS (irritable bowel syndrome)   . Depression     Current Outpatient Prescriptions on File Prior to Visit  Medication Sig Dispense Refill  . ALPRAZolam (XANAX) 0.5 MG tablet Take 1 tablet (0.5 mg total) by mouth 2 (two) times daily as needed.  30 tablet  0  . bismuth subsalicylate (PEPTO-BISMOL) 262 MG/15ML suspension Take 15 mLs by mouth as needed.       . calcium carbonate (TUMS - DOSED IN MG ELEMENTAL CALCIUM) 500 MG chewable tablet Chew 2 tablets by mouth daily.      . nitroGLYCERIN (NITROSTAT) 0.4 MG SL tablet 0.4 mg every 5 (five) minutes as needed. For chest pain      . NON FORMULARY Take 1 scoop by mouth daily as needed. [name] FIBER  Mix 1 scoop in water daily.      . pantoprazole (PROTONIX) 40 MG tablet Take 1 tablet (40 mg total) by mouth 2 (two) times daily before a meal.  60 tablet  5  . simvastatin (ZOCOR) 40 MG tablet Take 1 tablet (40 mg total) by mouth at bedtime.  30 tablet  2   No current facility-administered medications on file prior to visit.    Allergies  Allergen Reactions  . Ciprofloxacin     Facial numbness  . Peanut Butter Flavor     N/V  . Shellfish Allergy Swelling    Family History  Problem Relation Age of Onset  . Cancer Mother     renal  cell carcinoma  . Diabetes Mother   . Heart disease Father     first MI in 82s  . Cancer Sister     cervical cancer  . Heart disease Brother     first MI in 30s  . Alcohol abuse Sister   . Fibromyalgia Sister   . Arthritis Sister     osteoarthritis  . Irritable bowel syndrome Sister   . Leukemia      grandson    History   Social History  . Marital Status: Legally Separated    Spouse Name: N/A    Number of Children: 3  . Years of Education: N/A   Occupational History  . Student    Social History Main Topics  . Smoking status: Former Smoker -- 0.80 packs/day for 20 years    Types: Cigarettes    Quit date: 08/13/1985  . Smokeless tobacco: Never Used  . Alcohol Use: Yes     Comment: rare  . Drug Use: No  . Sexual Activity: None   Other Topics Concern  . None   Social History Narrative   Lives with husband and grandchild. School full time for paralegal   Caffeine use; occasional   Works  at [employer]         ROS See HPI  Filed Vitals:   04/30/13 1131  BP: 112/72  Pulse: 96  Temp: 98.1 F (36.7 C)  Resp: 14   Physical Exam  Vitals reviewed. Constitutional: She is oriented to person, place, and time and well-developed, well-nourished, and in no distress.  HENT:  Head: Normocephalic and atraumatic.  Right Ear: External ear normal.  Left Ear: External ear normal.  Mouth/Throat: Oropharynx is clear and moist. No oropharyngeal exudate.  TM WNL bilaterally.  Boggy nasal turbinates with green mucoid drainage.  Tenderness to palpation of frontal sinuses and right maxillary sinus.  Eyes: Conjunctivae are normal. Pupils are equal, round, and reactive to light.  Neck: Neck supple.  Cardiovascular: Normal rate, regular rhythm, normal heart sounds and intact distal pulses.   Pulmonary/Chest: Effort normal and breath sounds normal.  Abdominal: Soft. Bowel sounds are normal. She exhibits no distension and no mass. There is no rebound and no guarding.  Tenderness to  palpation of LLQ   Lymphadenopathy:    She has no cervical adenopathy.  Neurological: She is alert and oriented to person, place, and time. No cranial nerve deficit.  Skin: Skin is warm and dry. No rash noted.   Recent Results (from the past 2160 hour(s))  HEPATIC FUNCTION PANEL     Status: None   Collection Time    02/04/13  9:06 AM      Result Value Range   Total Bilirubin 0.8  0.3 - 1.2 mg/dL   Bilirubin, Direct 0.1  0.0 - 0.3 mg/dL   Indirect Bilirubin 0.7  0.0 - 0.9 mg/dL   Alkaline Phosphatase 59  39 - 117 U/L   AST 23  0 - 37 U/L   ALT 23  0 - 35 U/L   Total Protein 7.3  6.0 - 8.3 g/dL   Albumin 4.5  3.5 - 5.2 g/dL  LIPID PANEL     Status: Abnormal   Collection Time    02/04/13  9:06 AM      Result Value Range   Cholesterol 239 (*) 0 - 200 mg/dL   Comment: ATP III Classification:           < 200        mg/dL        Desirable          200 - 239     mg/dL        Borderline High          >= 240        mg/dL        High         Triglycerides 144  <150 mg/dL   HDL 51  >40 mg/dL   Total CHOL/HDL Ratio 4.7     VLDL 29  0 - 40 mg/dL   LDL Cholesterol 981 (*) 0 - 99 mg/dL   Comment:       Total Cholesterol/HDL Ratio:CHD Risk                            Coronary Heart Disease Risk Table                                            Men       Women  1/2 Average Risk              3.4        3.3                  Average Risk              5.0        4.4               2X Average Risk              9.6        7.1               3X Average Risk             23.4       11.0     Use the calculated Patient Ratio above and the CHD Risk table      to determine the patient's CHD Risk.     ATP III Classification (LDL):           < 100        mg/dL         Optimal          100 - 129     mg/dL         Near or Above Optimal          130 - 159     mg/dL         Borderline High          160 - 189     mg/dL         High           > 190        mg/dL         Very High        CBC WITH  DIFFERENTIAL     Status: Abnormal   Collection Time    02/04/13  9:06 AM      Result Value Range   WBC 3.9 (*) 4.0 - 10.5 K/uL   RBC 4.51  3.87 - 5.11 MIL/uL   Hemoglobin 13.8  12.0 - 15.0 g/dL   HCT 16.1  09.6 - 04.5 %   MCV 86.7  78.0 - 100.0 fL   MCH 30.6  26.0 - 34.0 pg   MCHC 35.3  30.0 - 36.0 g/dL   RDW 40.9  81.1 - 91.4 %   Platelets 293  150 - 400 K/uL   Neutrophils Relative % 54  43 - 77 %   Neutro Abs 2.2  1.7 - 7.7 K/uL   Lymphocytes Relative 32  12 - 46 %   Lymphs Abs 1.2  0.7 - 4.0 K/uL   Monocytes Relative 11  3 - 12 %   Monocytes Absolute 0.4  0.1 - 1.0 K/uL   Eosinophils Relative 2  0 - 5 %   Eosinophils Absolute 0.1  0.0 - 0.7 K/uL   Basophils Relative 1  0 - 1 %   Basophils Absolute 0.0  0.0 - 0.1 K/uL   Smear Review Criteria for review not met    VITAMIN B12     Status: None   Collection Time    02/04/13  9:20 AM      Result Value Range   Vitamin B-12 786  211 - 911 pg/mL  VITAMIN B6     Status: Abnormal   Collection Time  02/04/13  9:20 AM      Result Value Range   Vitamin B6 64.6 (*) 2.1 - 21.7 ng/mL   Comment:       Conversion Factor:  Nanograms/mL x 4.046 = nanomoles/L  VITAMIN D 25 HYDROXY     Status: None   Collection Time    02/04/13  9:20 AM      Result Value Range   Vit D, 25-Hydroxy 37  30 - 89 ng/mL   Comment: This assay accurately quantifies Vitamin D, which is the sum of the     25-Hydroxy forms of Vitamin D2 and D3.  Studies have shown that the     optimum concentration of 25-Hydroxy Vitamin D is 30 ng/mL or higher.      Concentrations of Vitamin D between 20 and 29 ng/mL are considered to     be insufficient and concentrations less than 20 ng/mL are considered     to be deficient for Vitamin D.   Assessment/Plan: Diverticulosis of colon (without mention of hemorrhage) CBC, BMP ordered.  Patient instructed liquid diet.  Ibuprofen for pain.  Rx Augmentin and Flagyl due to history.  Patient to return if symptoms worsen or to ED if  symptoms worsen and are severe.  Acute sinusitis with symptoms > 10 days Rx Augmentin.  Increase fluids.  Rest.  Daily claritin.  Saline nasal spray

## 2013-05-01 MED ORDER — AMOXICILLIN-POT CLAVULANATE 875-125 MG PO TABS: 1.0000 | ORAL_TABLET | Freq: Two times a day (BID) | ORAL | Status: DC

## 2013-05-01 MED ORDER — METRONIDAZOLE 500 MG PO TABS: 500.0000 mg | ORAL_TABLET | Freq: Three times a day (TID) | ORAL | Status: DC

## 2013-05-01 NOTE — Addendum Note (Signed)
Addended by: Marcelline Mates on: 05/01/2013 04:25 PM   Modules accepted: Orders

## 2013-06-29 ENCOUNTER — Other Ambulatory Visit: Payer: Self-pay | Admitting: Family

## 2013-06-29 NOTE — Telephone Encounter (Signed)
eScribe request for refill on Alprazolam Last filled - 06.25.14, #30x0 Last AEX - 06.25.14 w/PCP; 09.18.14 w/Cody Daphine Deutscher, PA-C Next AEX - Was to F/u in [2] wks from 09.18.14 visit Please Advise/SLS

## 2013-06-30 NOTE — Telephone Encounter (Signed)
Rx request phoned to pharmacy/SLS  

## 2013-06-30 NOTE — Telephone Encounter (Signed)
Ok to send #30 no refills.  

## 2013-07-07 ENCOUNTER — Telehealth: Payer: Self-pay | Admitting: *Deleted

## 2013-07-07 NOTE — Telephone Encounter (Signed)
Received message from pt that she called for a refill of her alprazolam a week ago and the pharmacy still hasn't received rx.  Spoke with pharmacist and verified that rx has been ready for 8 days. Notified pt.

## 2013-07-22 ENCOUNTER — Ambulatory Visit (INDEPENDENT_AMBULATORY_CARE_PROVIDER_SITE_OTHER): Payer: 59 | Admitting: Family

## 2013-07-22 DIAGNOSIS — Z0000 Encounter for general adult medical examination without abnormal findings: Secondary | ICD-10-CM

## 2013-07-22 LAB — LIPID PANEL
Cholesterol: 249 mg/dL — ABNORMAL HIGH (ref 0–200)
LDL Cholesterol: 166 mg/dL — ABNORMAL HIGH (ref 0–99)
Total CHOL/HDL Ratio: 4.4 Ratio
VLDL: 27 mg/dL (ref 0–40)

## 2013-07-22 LAB — BASIC METABOLIC PANEL WITH GFR
BUN: 13 mg/dL (ref 6–23)
CO2: 29 mEq/L (ref 19–32)
Chloride: 101 mEq/L (ref 96–112)
GFR, Est African American: >89 mL/min
Glucose, Bld: 92 mg/dL (ref 70–99)
Potassium: 4.4 mEq/L (ref 3.5–5.3)
Sodium: 136 mEq/L (ref 135–145)

## 2013-07-22 LAB — URINALYSIS, ROUTINE W REFLEX MICROSCOPIC
Hgb urine dipstick: NEGATIVE
Nitrite: NEGATIVE
Protein, ur: NEGATIVE mg/dL
pH: 6.5 (ref 5.0–8.0)

## 2013-07-22 LAB — CBC WITH DIFFERENTIAL/PLATELET
Basophils Relative: 1 % (ref 0–1)
Eosinophils Absolute: 0 10*3/uL (ref 0.0–0.7)
HCT: 38.2 % (ref 36.0–46.0)
Hemoglobin: 13.6 g/dL (ref 12.0–15.0)
Lymphs Abs: 1.5 10*3/uL (ref 0.7–4.0)
MCH: 30.9 pg (ref 26.0–34.0)
MCHC: 35.6 g/dL (ref 30.0–36.0)
MCV: 86.8 fL (ref 78.0–100.0)
Monocytes Absolute: 0.4 10*3/uL (ref 0.1–1.0)
Monocytes Relative: 8 % (ref 3–12)
Neutrophils Relative %: 63 % (ref 43–77)
RBC: 4.4 MIL/uL (ref 3.87–5.11)

## 2013-07-22 LAB — HEPATIC FUNCTION PANEL
ALT: 16 U/L (ref 0–35)
AST: 20 U/L (ref 0–37)
Albumin: 4.7 g/dL (ref 3.5–5.2)
Total Bilirubin: 0.6 mg/dL (ref 0.3–1.2)

## 2013-07-22 LAB — URINALYSIS, MICROSCOPIC ONLY
Casts: NONE SEEN
Crystals: NONE SEEN

## 2013-07-22 NOTE — Progress Notes (Signed)
   Subjective:    Patient ID: Jamie [name], female    DOB: Feb 08, 1951, 62 y.o.   MRN: [id number]  HPI    Review of Systems     Objective:   Physical Exam        Assessment & Plan:  Pt arrived 30 minutes late- cpx to be rescheduled.  She wishes to complete fasting labs today.

## 2013-07-23 ENCOUNTER — Encounter: Payer: Self-pay | Admitting: Family

## 2013-08-10 ENCOUNTER — Ambulatory Visit: Payer: Self-pay | Admitting: Physician Assistant

## 2013-08-10 ENCOUNTER — Emergency Department (HOSPITAL_BASED_OUTPATIENT_CLINIC_OR_DEPARTMENT_OTHER)
Admission: EM | Admit: 2013-08-10 | Discharge: 2013-08-10 | Disposition: A | Discharge status: Home / Self Care | Payer: 59 | Attending: Emergency Medicine | Admitting: Emergency Medicine

## 2013-08-10 ENCOUNTER — Encounter (HOSPITAL_BASED_OUTPATIENT_CLINIC_OR_DEPARTMENT_OTHER): Payer: Self-pay | Admitting: Emergency Medicine

## 2013-08-10 ENCOUNTER — Emergency Department (HOSPITAL_BASED_OUTPATIENT_CLINIC_OR_DEPARTMENT_OTHER): Payer: 59

## 2013-08-10 DIAGNOSIS — Z8719 Personal history of other diseases of the digestive system: Secondary | ICD-10-CM | POA: Diagnosis not present

## 2013-08-10 DIAGNOSIS — Z79899 Other long term (current) drug therapy: Secondary | ICD-10-CM | POA: Insufficient documentation

## 2013-08-10 DIAGNOSIS — F411 Generalized anxiety disorder: Secondary | ICD-10-CM | POA: Insufficient documentation

## 2013-08-10 DIAGNOSIS — Z87891 Personal history of nicotine dependence: Secondary | ICD-10-CM | POA: Diagnosis not present

## 2013-08-10 DIAGNOSIS — J209 Acute bronchitis, unspecified: Secondary | ICD-10-CM | POA: Diagnosis not present

## 2013-08-10 DIAGNOSIS — E785 Hyperlipidemia, unspecified: Secondary | ICD-10-CM | POA: Diagnosis not present

## 2013-08-10 DIAGNOSIS — F329 Major depressive disorder, single episode, unspecified: Secondary | ICD-10-CM | POA: Diagnosis not present

## 2013-08-10 DIAGNOSIS — J029 Acute pharyngitis, unspecified: Secondary | ICD-10-CM | POA: Diagnosis present

## 2013-08-10 DIAGNOSIS — J40 Bronchitis, not specified as acute or chronic: Secondary | ICD-10-CM

## 2013-08-10 DIAGNOSIS — F3289 Other specified depressive episodes: Secondary | ICD-10-CM | POA: Insufficient documentation

## 2013-08-10 DIAGNOSIS — Z792 Long term (current) use of antibiotics: Secondary | ICD-10-CM | POA: Insufficient documentation

## 2013-08-10 MED ORDER — CLARITHROMYCIN 500 MG PO TABS: 500.0000 mg | ORAL_TABLET | Freq: Two times a day (BID) | ORAL | Status: DC

## 2013-08-10 MED ORDER — HYDROCOD POLST-CHLORPHEN POLST 10-8 MG/5ML PO LQCR: 5.0000 mL | Freq: Two times a day (BID) | ORAL | Status: DC | PRN

## 2013-08-10 MED ORDER — ALBUTEROL SULFATE HFA 108 (90 BASE) MCG/ACT IN AERS: 2.0000 | INHALATION_SPRAY | RESPIRATORY_TRACT | Status: DC | PRN

## 2013-08-10 NOTE — ED Notes (Signed)
Patient transported to X-ray 

## 2013-08-10 NOTE — ED Notes (Signed)
Pt reports nasal congestion, sore throat and cough x 4 days. °

## 2013-08-10 NOTE — ED Provider Notes (Signed)
CSN: [id number]     Arrival date & time 08/10/13  1010 History   First MD Initiated Contact with Patient 08/10/13 1020     Chief Complaint  Patient presents with  . Sore Throat  . Nasal Congestion  . Cough   (Consider location/radiation/quality/duration/timing/severity/associated sxs/prior Treatment) HPI Comments: Patient has been sick for 4 days. She says she started with flulike there is fluid behind her years. She has been using Mucinex D, but has developed progressive increased sinus congestion, sore throat and now a productive cough.  Patient is a 62 y.o. female presenting with pharyngitis and cough.  Sore Throat  Cough   Past Medical History  Diagnosis Date  . Anxiety   . Hyperlipidemia   . Diverticulosis   . IBS (irritable bowel syndrome)   . Depression    Past Surgical History  Procedure Laterality Date  . Abdominal hysterectomy    . Tubal ligation    . Hemorrhoid surgery    . Tumor removal      Left thigh    Family History  Problem Relation Age of Onset  . Cancer Mother     renal cell carcinoma  . Diabetes Mother   . Heart disease Father     first MI in 47s  . Cancer Sister     cervical cancer  . Heart disease Brother     first MI in 35s  . Alcohol abuse Sister   . Fibromyalgia Sister   . Arthritis Sister     osteoarthritis  . Irritable bowel syndrome Sister   . Leukemia      grandson   History  Substance Use Topics  . Smoking status: Former Smoker -- 0.80 packs/day for 20 years    Types: Cigarettes    Quit date: 08/13/1985  . Smokeless tobacco: Never Used  . Alcohol Use: Yes     Comment: rare   OB History   Grav Para Term Preterm Abortions TAB SAB Ect Mult Living                 Review of Systems  HENT: Positive for congestion.   Respiratory: Positive for cough.   All other systems reviewed and are negative.    Allergies  Ciprofloxacin; Peanut butter flavor; and Shellfish allergy  Home Medications   Current Outpatient Rx  Name   Route  Sig  Dispense  Refill  . ALPRAZolam (XANAX) 0.5 MG tablet      TAKE ONE TABLET BY MOUTH TWICE DAILY AS NEEDED FOR SLEEP   30 tablet   0   . amoxicillin-clavulanate (AUGMENTIN) 875-125 MG per tablet   Oral   Take 1 tablet by mouth 2 (two) times daily.   20 tablet   0   . bismuth subsalicylate (PEPTO-BISMOL) 262 MG/15ML suspension   Oral   Take 15 mLs by mouth as needed.          . calcium carbonate (TUMS - DOSED IN MG ELEMENTAL CALCIUM) 500 MG chewable tablet   Oral   Chew 2 tablets by mouth daily.         . metroNIDAZOLE (FLAGYL) 500 MG tablet   Oral   Take 1 tablet (500 mg total) by mouth 3 (three) times daily.   21 tablet   0   . nitroGLYCERIN (NITROSTAT) 0.4 MG SL tablet      0.4 mg every 5 (five) minutes as needed. For chest pain         . NON FORMULARY  Oral   Take 1 scoop by mouth daily as needed. [name] FIBER  Mix 1 scoop in water daily.         . pantoprazole (PROTONIX) 40 MG tablet   Oral   Take 1 tablet (40 mg total) by mouth 2 (two) times daily before a meal.   60 tablet   5   . simvastatin (ZOCOR) 40 MG tablet   Oral   Take 1 tablet (40 mg total) by mouth at bedtime.   30 tablet   2    BP 127/85  Pulse 105  Temp(Src) 98.9 F (37.2 C)  Resp 18  Ht 5\' 4"  (1.626 m)  Wt 156 lb (70.761 kg)  BMI 26.76 kg/m2  SpO2 99% Physical Exam  Constitutional: She is oriented to person, place, and time. She appears well-developed and well-nourished. No distress.  HENT:  Head: Normocephalic and atraumatic.  Right Ear: Hearing and tympanic membrane normal.  Left Ear: Hearing and tympanic membrane normal.  Nose: Nose normal.  Mouth/Throat: Oropharynx is clear and moist and mucous membranes are normal. No oropharyngeal exudate.  Eyes: Conjunctivae and EOM are normal. Pupils are equal, round, and reactive to light.  Neck: Normal range of motion. Neck supple.  Cardiovascular: Regular rhythm, S1 normal and S2 normal.  Exam reveals no  gallop and no friction rub.   No murmur heard. Pulmonary/Chest: Effort normal and breath sounds normal. No respiratory distress. She exhibits no tenderness.  Abdominal: Soft. Normal appearance and bowel sounds are normal. There is no hepatosplenomegaly. There is no tenderness. There is no rebound, no guarding, no tenderness at McBurney's point and negative Murphy's sign. No hernia.  Musculoskeletal: Normal range of motion.  Neurological: She is alert and oriented to person, place, and time. She has normal strength. No cranial nerve deficit or sensory deficit. Coordination normal. GCS eye subscore is 4. GCS verbal subscore is 5. GCS motor subscore is 6.  Skin: Skin is warm, dry and intact. No rash noted. No cyanosis.  Psychiatric: She has a normal mood and affect. Her speech is normal and behavior is normal. Thought content normal.    ED Course  Procedures (including critical care time) Labs Review Labs Reviewed - No data to display Imaging Review No results found.  EKG Interpretation   None       MDM   1. Bronchitis    Patient with upper respiratory infection symptoms for 4 days. Oxygenation is normal, no signs of pneumonia.    Gilda Crease, MD 08/15/13 0730

## 2013-08-27 ENCOUNTER — Telehealth: Payer: Self-pay | Admitting: Family

## 2013-08-27 NOTE — Telephone Encounter (Signed)
Patient states that she is applying for foster care and social services is wanting to know how many times a patient filled her xanax prescription in the last two years.

## 2013-08-28 NOTE — Telephone Encounter (Signed)
Our records show twice in the last 2 years.  This only reflects refills from our office.

## 2013-08-28 NOTE — Telephone Encounter (Signed)
Please advise 

## 2013-08-31 NOTE — Telephone Encounter (Signed)
Left detailed message on home #. Advised pt she may get a more comprehensive refill history from her pharmacy if she used the same one for her refills. Advised pt to call if further questions.

## 2013-10-02 ENCOUNTER — Encounter: Payer: Self-pay | Admitting: Family

## 2013-10-02 ENCOUNTER — Ambulatory Visit (INDEPENDENT_AMBULATORY_CARE_PROVIDER_SITE_OTHER): Payer: 59 | Admitting: Family

## 2013-10-02 VITALS — BP 110/74 | HR 90 | Temp 97.7°F | Resp 16 | Ht 64.5 in | Wt 143.1 lb

## 2013-10-02 DIAGNOSIS — E538 Deficiency of other specified B group vitamins: Secondary | ICD-10-CM

## 2013-10-02 DIAGNOSIS — Z0000 Encounter for general adult medical examination without abnormal findings: Secondary | ICD-10-CM

## 2013-10-02 LAB — CBC WITH DIFFERENTIAL/PLATELET
Basophils Absolute: 0 10*3/uL (ref 0.0–0.1)
Basophils Relative: 1 % (ref 0–1)
EOS ABS: 0 10*3/uL (ref 0.0–0.7)
Eosinophils Relative: 1 % (ref 0–5)
HEMATOCRIT: 38.3 % (ref 36.0–46.0)
HEMOGLOBIN: 13.1 g/dL (ref 12.0–15.0)
LYMPHS ABS: 1.3 10*3/uL (ref 0.7–4.0)
Lymphocytes Relative: 30 % (ref 12–46)
MCH: 30 pg (ref 26.0–34.0)
MCHC: 34.2 g/dL (ref 30.0–36.0)
MCV: 87.8 fL (ref 78.0–100.0)
MONO ABS: 0.5 10*3/uL (ref 0.1–1.0)
MONOS PCT: 11 % (ref 3–12)
Neutro Abs: 2.4 10*3/uL (ref 1.7–7.7)
Neutrophils Relative %: 57 % (ref 43–77)
Platelets: 288 10*3/uL (ref 150–400)
RBC: 4.36 MIL/uL (ref 3.87–5.11)
RDW: 13.4 % (ref 11.5–15.5)
WBC: 4.2 10*3/uL (ref 4.0–10.5)

## 2013-10-02 LAB — BASIC METABOLIC PANEL WITH GFR
BUN: 14 mg/dL (ref 6–23)
CO2: 29 mEq/L (ref 19–32)
Calcium: 9.2 mg/dL (ref 8.4–10.5)
Chloride: 103 mEq/L (ref 96–112)
Creat: 0.68 mg/dL (ref 0.50–1.10)
Glucose, Bld: 73 mg/dL (ref 70–99)
POTASSIUM: 4.8 meq/L (ref 3.5–5.3)
Sodium: 140 mEq/L (ref 135–145)

## 2013-10-02 LAB — HEPATIC FUNCTION PANEL
ALT: 19 U/L (ref 0–35)
AST: 22 U/L (ref 0–37)
Albumin: 4.5 g/dL (ref 3.5–5.2)
Alkaline Phosphatase: 50 U/L (ref 39–117)
BILIRUBIN DIRECT: 0.1 mg/dL (ref 0.0–0.3)
BILIRUBIN INDIRECT: 0.4 mg/dL (ref 0.2–1.2)
BILIRUBIN TOTAL: 0.5 mg/dL (ref 0.2–1.2)
Total Protein: 6.8 g/dL (ref 6.0–8.3)

## 2013-10-02 LAB — LIPID PANEL
CHOL/HDL RATIO: 4.2 ratio
CHOLESTEROL: 223 mg/dL — AB (ref 0–200)
HDL: 53 mg/dL (ref >39)
LDL Cholesterol: 145 mg/dL — ABNORMAL HIGH (ref 0–99)
Triglycerides: 123 mg/dL (ref <150)
VLDL: 25 mg/dL (ref 0–40)

## 2013-10-02 LAB — VITAMIN B12: Vitamin B-12: 349 pg/mL (ref 211–911)

## 2013-10-02 LAB — TSH: TSH: 2.064 u[IU]/mL (ref 0.350–4.500)

## 2013-10-02 LAB — HIV ANTIBODY (ROUTINE TESTING W REFLEX): HIV: NONREACTIVE

## 2013-10-02 NOTE — Progress Notes (Signed)
Pre visit review using our clinic review tool, if applicable. No additional management support is needed unless otherwise documented below in the visit note. 

## 2013-10-02 NOTE — Progress Notes (Signed)
Subjective:    Patient ID: Jamie [name], female    DOB: 07-01-1951, 63 y.o.   MRN: [id number]  HPI  Jamie [name] is a 63 yr old female who presents today for cpx.  Immunizations:  Tetanus up to date, declines flu shot Colonoscopy 2009 will need follow up in 2019 Diet: reports healthy diet, more active Pap- s/p TAH/BSO 4/11 Dexa- never due Mammogram: up to date  Wt Readings from Last 3 Encounters:  10/02/13 143 lb 1.9 oz (64.919 kg)  08/10/13 156 lb (70.761 kg)  04/30/13 156 lb 8 oz (70.988 kg)   Body mass index is 24.2 kg/(m^2).    Review of Systems  Constitutional: Negative for unexpected weight change.  HENT: Negative for hearing loss and postnasal drip.   Eyes: Negative for visual disturbance.  Respiratory: Negative for cough and shortness of breath.   Cardiovascular: Negative for chest pain.  Gastrointestinal: Negative for nausea, vomiting and diarrhea.  Genitourinary: Negative for dysuria and frequency.  Musculoskeletal: Negative for arthralgias and myalgias.  Neurological: Negative for headaches.  Hematological: Negative for adenopathy.  Psychiatric/Behavioral:       Denies anxiety/depression   Past Medical History  Diagnosis Date  . Anxiety   . Hyperlipidemia   . Diverticulosis   . IBS (irritable bowel syndrome)   . Depression     History   Social History  . Marital Status: Legally Separated    Spouse Name: N/A    Number of Children: 3  . Years of Education: N/A   Occupational History  . Student    Social History Main Topics  . Smoking status: Former Smoker -- 0.80 packs/day for 20 years    Types: Cigarettes    Quit date: 08/13/1985  . Smokeless tobacco: Never Used  . Alcohol Use: Yes     Comment: rare  . Drug Use: No  . Sexual Activity: Not on file   Other Topics Concern  . Not on file   Social History Narrative   Lives with husband and grandchild. School full time for paralegal   Caffeine use; occasional   Works at [employer]           Past Surgical History  Procedure Laterality Date  . Abdominal hysterectomy    . Tubal ligation    . Hemorrhoid surgery    . Tumor removal      Left thigh     Family History  Problem Relation Age of Onset  . Cancer Mother     renal cell carcinoma  . Diabetes Mother   . Heart disease Father     first MI in 43s  . Cancer Sister     cervical cancer  . Heart disease Brother     first MI in 59s  . Alcohol abuse Sister   . Fibromyalgia Sister   . Arthritis Sister     osteoarthritis  . Irritable bowel syndrome Sister   . Leukemia      grandson    Allergies  Allergen Reactions  . Ciprofloxacin     Facial numbness  . Peanut Butter Flavor     N/V  . Shellfish Allergy Swelling    Current Outpatient Prescriptions on File Prior to Visit  Medication Sig Dispense Refill  . ALPRAZolam (XANAX) 0.5 MG tablet TAKE ONE TABLET BY MOUTH TWICE DAILY AS NEEDED FOR SLEEP  30 tablet  0  . nitroGLYCERIN (NITROSTAT) 0.4 MG SL tablet 0.4 mg every 5 (five) minutes as needed. For chest pain      .  pantoprazole (PROTONIX) 40 MG tablet Take 1 tablet (40 mg total) by mouth 2 (two) times daily before a meal.  60 tablet  5  . simvastatin (ZOCOR) 40 MG tablet Take 1 tablet (40 mg total) by mouth at bedtime.  30 tablet  2   No current facility-administered medications on file prior to visit.    BP 110/74  Pulse 90  Temp(Src) 97.7 F (36.5 C) (Oral)  Resp 16  Ht 5' 4.5" (1.638 m)  Wt 143 lb 1.9 oz (64.919 kg)  BMI 24.20 kg/m2  SpO2 99%       Objective:   Physical Exam  Physical Exam  Constitutional: She is oriented to person, place, and time. She appears well-developed and well-nourished. No distress.  HENT:  Head: Normocephalic and atraumatic.  Right Ear: Tympanic membrane and ear canal normal.  Left Ear: Tympanic membrane and ear canal normal.  Mouth/Throat: Oropharynx is clear and moist.  Eyes: Pupils are equal, round, and reactive to light. No scleral icterus.  Neck: Normal  range of motion. No thyromegaly present.  Cardiovascular: Normal rate and regular rhythm.   No murmur heard. Pulmonary/Chest: Effort normal and breath sounds normal. No respiratory distress. He has no wheezes. She has no rales. She exhibits no tenderness.  Abdominal: Soft. Bowel sounds are normal. He exhibits no distension and no mass. There is no tenderness. There is no rebound and no guarding.  Musculoskeletal: She exhibits no edema.  Lymphadenopathy:    She has no cervical adenopathy.  Neurological: She is alert and oriented to person, place, and time. She has normal reflexes. She exhibits normal muscle tone. Coordination normal.  Skin: Skin is warm and dry.  Psychiatric: She has a normal mood and affect. Her behavior is normal. Judgment and thought content normal.  Breasts: Examined lying Right: Without masses, retractions, discharge or axillary adenopathy.  Left: Without masses, retractions, discharge or axillary adenopathy.            Assessment & Plan:        Assessment & Plan:

## 2013-10-02 NOTE — Patient Instructions (Signed)
Please complete lab work prior to leaving. Schedule bone density at the front desk. Follow up in 6 months.

## 2013-10-02 NOTE — Assessment & Plan Note (Signed)
Discussed healthy diet, importance of exercise.  Refer for dexa, obtain non-fasting lab work.

## 2013-10-03 ENCOUNTER — Encounter: Payer: Self-pay | Admitting: Family

## 2013-10-03 LAB — URINALYSIS, ROUTINE W REFLEX MICROSCOPIC
Bilirubin Urine: NEGATIVE
Glucose, UA: NEGATIVE mg/dL
Hgb urine dipstick: NEGATIVE
Ketones, ur: NEGATIVE mg/dL
NITRITE: NEGATIVE
Protein, ur: NEGATIVE mg/dL
Specific Gravity, Urine: 1.017 (ref 1.005–1.030)
UROBILINOGEN UA: 0.2 mg/dL (ref 0.0–1.0)
pH: 5.5 (ref 5.0–8.0)

## 2013-10-03 LAB — URINALYSIS, MICROSCOPIC ONLY
Bacteria, UA: NONE SEEN
Casts: NONE SEEN
Crystals: NONE SEEN
Squamous Epithelial / LPF: NONE SEEN

## 2013-10-05 ENCOUNTER — Telehealth: Payer: Self-pay | Admitting: *Deleted

## 2013-10-05 NOTE — Telephone Encounter (Signed)
Signed.

## 2013-10-05 NOTE — Telephone Encounter (Signed)
Form faxed to [facility] Dept at [phone]. Copy sent to scan and original placed at front desk for pick up. Left detailed message on voicemail that form was sent to front desk for pick up and to call if any questions.

## 2013-10-05 NOTE — Telephone Encounter (Signed)
Received message from pt wanting to know if form had been completed re: foster care application. Form was received Friday evening and has been placed in Provider folder for signature / completion.  Pt wants to pick up a copy by tomorrow.  Please advise.

## 2013-10-12 NOTE — Telephone Encounter (Signed)
Form was returned missing Provider's signature and needed box checked re: TB testing.  Form completed and faxed to # below, copy sent for scanning. Original sent to front desk for pt to pick up.

## 2013-10-19 ENCOUNTER — Ambulatory Visit (INDEPENDENT_AMBULATORY_CARE_PROVIDER_SITE_OTHER): Payer: 59 | Admitting: Family

## 2013-10-19 ENCOUNTER — Encounter: Payer: Self-pay | Admitting: Family

## 2013-10-19 VITALS — BP 108/78 | HR 67 | Temp 98.0°F | Resp 16 | Ht 64.5 in | Wt 142.1 lb

## 2013-10-19 DIAGNOSIS — F341 Dysthymic disorder: Secondary | ICD-10-CM

## 2013-10-19 DIAGNOSIS — F419 Anxiety disorder, unspecified: Secondary | ICD-10-CM

## 2013-10-19 DIAGNOSIS — J309 Allergic rhinitis, unspecified: Secondary | ICD-10-CM

## 2013-10-19 DIAGNOSIS — F411 Generalized anxiety disorder: Secondary | ICD-10-CM

## 2013-10-19 DIAGNOSIS — J3089 Other allergic rhinitis: Secondary | ICD-10-CM

## 2013-10-19 DIAGNOSIS — J302 Other seasonal allergic rhinitis: Secondary | ICD-10-CM

## 2013-10-19 NOTE — Progress Notes (Signed)
Subjective:    Patient ID: Jamie [name], female    DOB: Jul 15, 1951, 63 y.o.   MRN: [id number]  HPI  Ms. Jamie [name] is a 63 yr old female who presents today with chief complaint of otalgia.Reports some dizziness. Wonders about inner ear infection. Reports sinus congestion for several days.  She is not taking any medications.    She also has some concerns re: form for foster care.  Reports that we indicated that she is currently taking xanax. She reports that she is not currently needing xanax.  Reports depression and anxiety are well controlled.    Review of Systems    see HPI  Past Medical History  Diagnosis Date  . Anxiety   . Hyperlipidemia   . Diverticulosis   . IBS (irritable bowel syndrome)   . Depression     History   Social History  . Marital Status: Legally Separated    Spouse Name: N/A    Number of Children: 3  . Years of Education: N/A   Occupational History  . Student    Social History Main Topics  . Smoking status: Former Smoker -- 0.80 packs/day for 20 years    Types: Cigarettes    Quit date: 08/13/1985  . Smokeless tobacco: Never Used  . Alcohol Use: Yes     Comment: rare  . Drug Use: No  . Sexual Activity: Not on file   Other Topics Concern  . Not on file   Social History Narrative   Lives with and grandchild (she is foster parent for grand child)Completed school full time.   Caffeine use; occasional   Works in Personal assistant, [employer].   In process of divorce.            Past Surgical History  Procedure Laterality Date  . Abdominal hysterectomy    . Tubal ligation    . Hemorrhoid surgery    . Tumor removal      Left thigh     Family History  Problem Relation Age of Onset  . Cancer Mother     renal cell carcinoma  . Diabetes Mother   . Heart disease Father     first MI in 9s  . Cancer Sister     cervical cancer  . Heart disease Brother     first MI in 19s  . Alcohol abuse Sister   . Fibromyalgia Sister   .  Arthritis Sister     osteoarthritis  . Irritable bowel syndrome Sister   . Leukemia      grandson    Allergies  Allergen Reactions  . Ciprofloxacin     Facial numbness  . Peanut Butter Flavor     N/V  . Shellfish Allergy Swelling    No current outpatient prescriptions on file prior to visit.   No current facility-administered medications on file prior to visit.    BP 108/78  Pulse 67  Temp(Src) 98 F (36.7 C) (Oral)  Resp 16  Ht 5' 4.5" (1.638 m)  Wt 142 lb 1.9 oz (64.465 kg)  BMI 24.03 kg/m2  SpO2 99%    Objective:   Physical Exam  Constitutional: She is oriented to person, place, and time. She appears well-developed and well-nourished. No distress.  HENT:  Head: Normocephalic and atraumatic.  Right Ear: Tympanic membrane and ear canal normal.  Left Ear: Tympanic membrane and ear canal normal.  Mouth/Throat: No oropharyngeal exudate, posterior oropharyngeal edema or posterior oropharyngeal erythema.  Neurological: She  is alert and oriented to person, place, and time.  Skin: Skin is warm and dry.  Psychiatric: She has a normal mood and affect. Her behavior is normal. Judgment and thought content normal.          Assessment & Plan:

## 2013-10-19 NOTE — Assessment & Plan Note (Signed)
Sinus congestion most likely due to allergic rhinitis. Advised pt to start claritin 10mg  once daily and nasal saline washes. Follow up if symptoms worsen or if symptoms do not improve.

## 2013-10-19 NOTE — Patient Instructions (Signed)
Start claritin 10 mg once daily. Call if symptoms worsen, if you develop fever, or if sinus symptoms not improved in 1 week. Follow up in 6 months.

## 2013-10-19 NOTE — Assessment & Plan Note (Signed)
Stable off of meds. No longer needing xanax.

## 2013-10-19 NOTE — Progress Notes (Signed)
Pre visit review using our clinic review tool, if applicable. No additional management support is needed unless otherwise documented below in the visit note. 

## 2013-12-21 ENCOUNTER — Encounter: Payer: Self-pay | Admitting: Physician Assistant

## 2013-12-21 ENCOUNTER — Ambulatory Visit (INDEPENDENT_AMBULATORY_CARE_PROVIDER_SITE_OTHER): Payer: 59 | Admitting: Physician Assistant

## 2013-12-21 VITALS — BP 105/71 | HR 83 | Temp 98.1°F | Resp 16 | Ht 64.5 in | Wt 143.0 lb

## 2013-12-21 DIAGNOSIS — K5732 Diverticulitis of large intestine without perforation or abscess without bleeding: Secondary | ICD-10-CM

## 2013-12-21 DIAGNOSIS — Z299 Encounter for prophylactic measures, unspecified: Secondary | ICD-10-CM

## 2013-12-21 DIAGNOSIS — F419 Anxiety disorder, unspecified: Principal | ICD-10-CM

## 2013-12-21 DIAGNOSIS — F32A Depression, unspecified: Secondary | ICD-10-CM

## 2013-12-21 DIAGNOSIS — F329 Major depressive disorder, single episode, unspecified: Secondary | ICD-10-CM | POA: Insufficient documentation

## 2013-12-21 DIAGNOSIS — F341 Dysthymic disorder: Secondary | ICD-10-CM

## 2013-12-21 LAB — COMPREHENSIVE METABOLIC PANEL
ALBUMIN: 4.3 g/dL (ref 3.5–5.2)
ALT: 19 U/L (ref 0–35)
AST: 23 U/L (ref 0–37)
Alkaline Phosphatase: 50 U/L (ref 39–117)
BUN: 16 mg/dL (ref 6–23)
CALCIUM: 9.4 mg/dL (ref 8.4–10.5)
CHLORIDE: 101 meq/L (ref 96–112)
CO2: 28 meq/L (ref 19–32)
Creatinine, Ser: 0.9 mg/dL (ref 0.4–1.2)
GFR: 65.14 mL/min (ref >60.00)
GLUCOSE: 117 mg/dL — AB (ref 70–99)
POTASSIUM: 3.6 meq/L (ref 3.5–5.1)
Sodium: 138 mEq/L (ref 135–145)
Total Bilirubin: 0.6 mg/dL (ref 0.2–1.2)
Total Protein: 7.5 g/dL (ref 6.0–8.3)

## 2013-12-21 LAB — CBC WITH DIFFERENTIAL/PLATELET
BASOS PCT: 0.5 % (ref 0.0–3.0)
Basophils Absolute: 0 10*3/uL (ref 0.0–0.1)
EOS PCT: 0.7 % (ref 0.0–5.0)
Eosinophils Absolute: 0 10*3/uL (ref 0.0–0.7)
HEMATOCRIT: 40 % (ref 36.0–46.0)
Hemoglobin: 13.5 g/dL (ref 12.0–15.0)
Lymphocytes Relative: 24.5 % (ref 12.0–46.0)
Lymphs Abs: 1.5 10*3/uL (ref 0.7–4.0)
MCHC: 33.8 g/dL (ref 30.0–36.0)
MCV: 90.4 fl (ref 78.0–100.0)
MONOS PCT: 6.9 % (ref 3.0–12.0)
Monocytes Absolute: 0.4 10*3/uL (ref 0.1–1.0)
NEUTROS PCT: 67.4 % (ref 43.0–77.0)
Neutro Abs: 4.2 10*3/uL (ref 1.4–7.7)
Platelets: 377 10*3/uL (ref 150.0–400.0)
RBC: 4.43 Mil/uL (ref 3.87–5.11)
RDW: 12.6 % (ref 11.5–15.5)
WBC: 6.2 10*3/uL (ref 4.0–10.5)

## 2013-12-21 LAB — HEMOGLOBIN A1C: HEMOGLOBIN A1C: 5.6 % (ref 4.6–6.5)

## 2013-12-21 MED ORDER — ONDANSETRON 8 MG PO TBDP: 8.0000 mg | ORAL_TABLET | Freq: Three times a day (TID) | ORAL | Status: DC | PRN

## 2013-12-21 MED ORDER — ALPRAZOLAM 0.25 MG PO TABS
0.2500 mg | ORAL_TABLET | Freq: Every evening | ORAL | Status: DC | PRN
Start: 2013-12-21 — End: 2014-03-02

## 2013-12-21 MED ORDER — ALPRAZOLAM 0.25 MG PO TABS: 0.2500 mg | ORAL_TABLET | Freq: Every evening | ORAL | Status: DC | PRN

## 2013-12-21 NOTE — Assessment & Plan Note (Signed)
Will obtain BMp and A1C to assess for hyperglycemia.

## 2013-12-21 NOTE — Progress Notes (Signed)
Pre visit review using our clinic review tool, if applicable. No additional management support is needed unless otherwise documented below in the visit note/SLS  

## 2013-12-21 NOTE — Assessment & Plan Note (Signed)
Xanax refilled. Further refills will need to be obtained from PCP.

## 2013-12-21 NOTE — Progress Notes (Signed)
Patient presents to clinic today for ER follow-up.  Patient was seen at [facility] ER on 5/3 for abdominal pain. History provided by patient as records were not able to be obtained prior to visit.  Records in the process of being faxed over for review. Patient endorses being diagnosed with diverticulitis by CT scan.  Patient was started on IV fluids and antibiotics. Was recommended to be admitted, but patient refused due to having to take care of a foster child. Patient was given PO course of Ciprofloxacin and Flagyl to complete outpatient.  Patient endorses taking medications as directed.  Endorses marked improvement. In abdominal pain.  Denies vomiting.  Endorses occasional nausea.  Denies fever, melena or hematochezia.   Patient requesting A1C to be checked due to family history of diabetes.  Patient without history, signs or symptoms of hyperglycemia.  Past Medical History  Diagnosis Date  . Anxiety   . Hyperlipidemia   . Diverticulosis   . IBS (irritable bowel syndrome)   . Depression    No current outpatient prescriptions on file prior to visit.   No current facility-administered medications on file prior to visit.    Allergies  Allergen Reactions  . Ciprofloxacin     Facial numbness  . Peanut Butter Flavor     N/V  . Shellfish Allergy Swelling    Family History  Problem Relation Age of Onset  . Cancer Mother     renal cell carcinoma  . Diabetes Mother   . Heart disease Father     first MI in 43s  . Cancer Sister     cervical cancer  . Heart disease Brother     first MI in 64s  . Alcohol abuse Sister   . Fibromyalgia Sister   . Arthritis Sister     osteoarthritis  . Irritable bowel syndrome Sister   . Leukemia      grandson    History   Social History  . Marital Status: Legally Separated    Spouse Name: N/A    Number of Children: 3  . Years of Education: N/A   Occupational History  . Student    Social History Main Topics  . Smoking status:  Former Smoker -- 0.80 packs/day for 20 years    Types: Cigarettes    Quit date: 08/13/1985  . Smokeless tobacco: Never Used  . Alcohol Use: Yes     Comment: rare  . Drug Use: No  . Sexual Activity: None   Other Topics Concern  . None   Social History Narrative   Lives with and grandchild (she is foster parent for grand child)Completed school full time.   Caffeine use; occasional   Works in Personal assistant, Economist.   In process of divorce.           Review of Systems - See HPI.  All other ROS are negative.  BP 105/71  Pulse 83  Temp(Src) 98.1 F (36.7 C) (Oral)  Resp 16  Ht 5' 4.5" (1.638 m)  Wt 143 lb (64.864 kg)  BMI 24.18 kg/m2  SpO2 100%  Physical Exam  Vitals reviewed. Constitutional: She is oriented to person, place, and time and well-developed, well-nourished, and in no distress.  HENT:  Head: Normocephalic and atraumatic.  Eyes: Conjunctivae are normal. Pupils are equal, round, and reactive to light.  Neck: Neck supple.  Cardiovascular: Normal rate, regular rhythm, normal heart sounds and intact distal pulses.   Pulmonary/Chest: Effort normal and breath sounds  normal. No respiratory distress.  Abdominal: Soft. Bowel sounds are normal. She exhibits no distension and no mass. There is no tenderness. There is no rebound and no guarding.  Neurological: She is alert and oriented to person, place, and time.  Skin: Skin is warm and dry. No rash noted.  Psychiatric: Affect normal.   Recent Results (from the past 2160 hour(s))  BASIC METABOLIC PANEL WITH GFR     Status: None   Collection Time    10/02/13 11:05 AM      Result Value Ref Range   Sodium 140  135 - 145 mEq/L   Potassium 4.8  3.5 - 5.3 mEq/L   Chloride 103  96 - 112 mEq/L   CO2 29  19 - 32 mEq/L   Glucose, Bld 73  70 - 99 mg/dL   BUN 14  6 - 23 mg/dL   Creat 0.68  0.50 - 1.10 mg/dL   Calcium 9.2  8.4 - 10.5 mg/dL   GFR, Est African American >89     GFR, Est Non African American  >89     Comment:       The estimated GFR is a calculation valid for adults (>=29 years old)     that uses the CKD-EPI algorithm to adjust for age and sex. It is       not to be used for children, pregnant women, hospitalized patients,        patients on dialysis, or with rapidly changing kidney function.     According to the NKDEP, eGFR >89 is normal, 60-89 shows mild     impairment, 30-59 shows moderate impairment, 15-29 shows severe     impairment and <15 is ESRD.        HEPATIC FUNCTION PANEL     Status: None   Collection Time    10/02/13 11:05 AM      Result Value Ref Range   Total Bilirubin 0.5  0.2 - 1.2 mg/dL   Comment: ** Please note change in reference range(s). **   Bilirubin, Direct 0.1  0.0 - 0.3 mg/dL   Indirect Bilirubin 0.4  0.2 - 1.2 mg/dL   Comment: ** Please note change in reference range(s). **   Alkaline Phosphatase 50  39 - 117 U/L   AST 22  0 - 37 U/L   ALT 19  0 - 35 U/L   Total Protein 6.8  6.0 - 8.3 g/dL   Albumin 4.5  3.5 - 5.2 g/dL  CBC WITH DIFFERENTIAL     Status: None   Collection Time    10/02/13 11:05 AM      Result Value Ref Range   WBC 4.2  4.0 - 10.5 K/uL   RBC 4.36  3.87 - 5.11 MIL/uL   Hemoglobin 13.1  12.0 - 15.0 g/dL   HCT 38.3  36.0 - 46.0 %   MCV 87.8  78.0 - 100.0 fL   MCH 30.0  26.0 - 34.0 pg   MCHC 34.2  30.0 - 36.0 g/dL   RDW 13.4  11.5 - 15.5 %   Platelets 288  150 - 400 K/uL   Neutrophils Relative % 57  43 - 77 %   Neutro Abs 2.4  1.7 - 7.7 K/uL   Lymphocytes Relative 30  12 - 46 %   Lymphs Abs 1.3  0.7 - 4.0 K/uL   Monocytes Relative 11  3 - 12 %   Monocytes Absolute 0.5  0.1 - 1.0 K/uL  Eosinophils Relative 1  0 - 5 %   Eosinophils Absolute 0.0  0.0 - 0.7 K/uL   Basophils Relative 1  0 - 1 %   Basophils Absolute 0.0  0.0 - 0.1 K/uL   Smear Review Criteria for review not met    LIPID PANEL     Status: Abnormal   Collection Time    10/02/13 11:05 AM      Result Value Ref Range   Cholesterol 223 (*) 0 - 200 mg/dL    Comment: ATP III Classification:           < 200        mg/dL        Desirable          200 - 239     mg/dL        Borderline High          >= 240        mg/dL        High         Triglycerides 123  <150 mg/dL   HDL 53  >39 mg/dL   Total CHOL/HDL Ratio 4.2     VLDL 25  0 - 40 mg/dL   LDL Cholesterol 145 (*) 0 - 99 mg/dL   Comment:       Total Cholesterol/HDL Ratio:CHD Risk                            Coronary Heart Disease Risk Table                                            Men       Women              1/2 Average Risk              3.4        3.3                  Average Risk              5.0        4.4               2X Average Risk              9.6        7.1               3X Average Risk             23.4       11.0     Use the calculated Patient Ratio above and the CHD Risk table      to determine the patient's CHD Risk.     ATP III Classification (LDL):           < 100        mg/dL         Optimal          100 - 129     mg/dL         Near or Above Optimal          130 - 159     mg/dL         Borderline High  160 - 189     mg/dL         High           > 190        mg/dL         Very High        TSH     Status: None   Collection Time    10/02/13 11:05 AM      Result Value Ref Range   TSH 2.064  0.350 - 4.500 uIU/mL  URINALYSIS, ROUTINE W REFLEX MICROSCOPIC     Status: Abnormal   Collection Time    10/02/13 11:05 AM      Result Value Ref Range   Color, Urine YELLOW  YELLOW   APPearance CLEAR  CLEAR   Specific Gravity, Urine 1.017  1.005 - 1.030   pH 5.5  5.0 - 8.0   Glucose, UA NEG  NEG mg/dL   Bilirubin Urine NEG  NEG   Ketones, ur NEG  NEG mg/dL   Hgb urine dipstick NEG  NEG   Protein, ur NEG  NEG mg/dL   Urobilinogen, UA 0.2  0.0 - 1.0 mg/dL   Nitrite NEG  NEG   Leukocytes, UA SMALL (*) NEG  HIV ANTIBODY (ROUTINE TESTING)     Status: None   Collection Time    10/02/13 11:05 AM      Result Value Ref Range   HIV NON REACTIVE  NON REACTIVE  VITAMIN B12      Status: None   Collection Time    10/02/13 11:05 AM      Result Value Ref Range   Vitamin B-12 349  211 - 911 pg/mL  URINALYSIS, MICROSCOPIC ONLY     Status: None   Collection Time    10/02/13 11:05 AM      Result Value Ref Range   Squamous Epithelial / LPF NONE SEEN  RARE   Crystals NONE SEEN  NONE SEEN   Casts NONE SEEN  NONE SEEN   WBC, UA 0-2  <3 WBC/hpf   RBC / HPF 0-2  <3 RBC/hpf   Bacteria, UA NONE SEEN  RARE  CBC WITH DIFFERENTIAL     Status: None   Collection Time    12/21/13  2:17 PM      Result Value Ref Range   WBC 6.2  4.0 - 10.5 K/uL   RBC 4.43  3.87 - 5.11 Mil/uL   Hemoglobin 13.5  12.0 - 15.0 g/dL   HCT 40.0  36.0 - 46.0 %   MCV 90.4  78.0 - 100.0 fl   MCHC 33.8  30.0 - 36.0 g/dL   RDW 12.6  11.5 - 15.5 %   Platelets 377.0  150.0 - 400.0 K/uL   Neutrophils Relative % 67.4  43.0 - 77.0 %   Lymphocytes Relative 24.5  12.0 - 46.0 %   Monocytes Relative 6.9  3.0 - 12.0 %   Eosinophils Relative 0.7  0.0 - 5.0 %   Basophils Relative 0.5  0.0 - 3.0 %   Neutro Abs 4.2  1.4 - 7.7 K/uL   Lymphs Abs 1.5  0.7 - 4.0 K/uL   Monocytes Absolute 0.4  0.1 - 1.0 K/uL   Eosinophils Absolute 0.0  0.0 - 0.7 K/uL   Basophils Absolute 0.0  0.0 - 0.1 K/uL  COMPREHENSIVE METABOLIC PANEL     Status: Abnormal   Collection Time    12/21/13  2:17 PM      Result  Value Ref Range   Sodium 138  135 - 145 mEq/L   Potassium 3.6  3.5 - 5.1 mEq/L   Chloride 101  96 - 112 mEq/L   CO2 28  19 - 32 mEq/L   Glucose, Bld 117 (*) 70 - 99 mg/dL   BUN 16  6 - 23 mg/dL   Creatinine, Ser 0.9  0.4 - 1.2 mg/dL   Total Bilirubin 0.6  0.2 - 1.2 mg/dL   Alkaline Phosphatase 50  39 - 117 U/L   AST 23  0 - 37 U/L   ALT 19  0 - 35 U/L   Total Protein 7.5  6.0 - 8.3 g/dL   Albumin 4.3  3.5 - 5.2 g/dL   Calcium 9.4  8.4 - 10.5 mg/dL   GFR 65.14  >60.00 mL/min  HEMOGLOBIN A1C     Status: None   Collection Time    12/21/13  2:17 PM      Result Value Ref Range   Hemoglobin A1C 5.6  4.6 - 6.5 %    Comment: Glycemic Control Guidelines for People with Diabetes:Non Diabetic:  <6%Goal of Therapy: <7%Additional Action Suggested:  >8%    Assessment/Plan: Anxiety and depression Xanax refilled. Further refills will need to be obtained from PCP.  Preventive measure Will obtain BMp and A1C to assess for hyperglycemia.  Diverticulitis of colon (without mention of hemorrhage) Afebrile.  Symptoms improved.  Unfortunately still waiting on records to be faxed over for review.  Will obtain CBC, BMP.  Patient to continue Flagyl and Cipro as directed.  Diet for diverticulosis given to patient.  Patient to follow-up if symptoms do not continue to improve.  Follow-up with GI specialist as directed.

## 2013-12-21 NOTE — Patient Instructions (Signed)
Please finish course of antibiotics.  Tylenol for mild pain.  Avoid foods with berries, nuts or seeds in them.  Please obtain labs.  I will call you with your results.  Please call or return to clinic if symptoms are not improving.   Diverticulitis Small pockets or "bubbles" can develop in the wall of the intestine. Diverticulitis is when those pockets become infected and inflamed. This causes stomach pain (usually on the left side). HOME CARE  Take all medicine as told by your doctor.  Try a clear liquid diet (broth, tea, or water) for as long as told by your doctor.  Keep all follow-up visits with your doctor.  You may be put on a low-fiber diet once you start feeling better. Here are foods that have low-fiber:  White breads, cereals, rice, and pasta.  Cooked fruits and vegetables or soft fresh fruits and vegetables without the skin.  Ground or well-cooked tender beef, ham, veal, lamb, pork, or poultry.  Eggs and seafood.  After you are doing well on the low-fiber diet, you may be put on a high-fiber diet. Here are ways to increase your fiber:  Choose whole-grain breads, cereals, pasta, and brown rice.  Choose fruits and vegetables with skin on. Do not overcook the vegetables.  Choose nuts, seeds, legumes, dried peas, beans, and lentils.  Look for food products that have more than 3 grams of fiber per serving on the food label. GET HELP RIGHT AWAY IF:  Your pain does not get better or gets worse.  You have trouble eating food.  You are not pooping (having bowel movements) like normal.  You have a temperature by mouth above 102 F (38.9 C), not controlled by medicine.  You keep throwing up (vomiting).  You have bloody or black, tarry poop (stools).  You are getting worse and not better. MAKE SURE YOU:   Understand these instructions.  Will watch your condition.  Will get help right away if you are not doing well or get worse. Document Released: 01/16/2008  Document Revised: 10/22/2011 Document Reviewed: 06/20/2009 [facility] Patient Information 2014 [place], [place].

## 2013-12-21 NOTE — Assessment & Plan Note (Signed)
Afebrile.  Symptoms improved.  Unfortunately still waiting on records to be faxed over for review.  Will obtain CBC, BMP.  Patient to continue Flagyl and Cipro as directed.  Diet for diverticulosis given to patient.  Patient to follow-up if symptoms do not continue to improve.  Follow-up with GI specialist as directed.

## 2013-12-23 ENCOUNTER — Telehealth: Payer: Self-pay | Admitting: *Deleted

## 2013-12-23 MED ORDER — PANTOPRAZOLE SODIUM 40 MG PO TBEC: 40.0000 mg | DELAYED_RELEASE_TABLET | Freq: Two times a day (BID) | ORAL | Status: DC

## 2013-12-23 NOTE — Telephone Encounter (Signed)
Received refill request from [facility] for protonix, 40mg  1 twice a day. Refill sent.

## 2013-12-30 ENCOUNTER — Telehealth: Payer: Self-pay | Admitting: *Deleted

## 2013-12-30 NOTE — Telephone Encounter (Signed)
Needs OV, if severe or worsening pain prior to being seen in office she should return to ED.

## 2013-12-30 NOTE — Telephone Encounter (Signed)
Notified pt and scheduled appt with Elyn Aquas, PA for tomorrow at 10am.

## 2013-12-30 NOTE — Telephone Encounter (Signed)
Received message from pt that she was treated for diverticulitis by [facility] ED earlier this month. Reports that she completed antibiotics for this and symptoms resolved but she is starting to have abdominal pain again and is wanting to know if we can send another round of antibiotics?  Please advise.

## 2013-12-31 ENCOUNTER — Ambulatory Visit: Payer: Self-pay | Admitting: Physician Assistant

## 2014-01-01 ENCOUNTER — Other Ambulatory Visit: Payer: Self-pay | Admitting: Physician Assistant

## 2014-01-01 ENCOUNTER — Encounter: Payer: Self-pay | Admitting: Physician Assistant

## 2014-01-01 ENCOUNTER — Ambulatory Visit (HOSPITAL_BASED_OUTPATIENT_CLINIC_OR_DEPARTMENT_OTHER)
Admission: RE | Admit: 2014-01-01 | Discharge: 2014-01-01 | Disposition: A | Discharge status: Home / Self Care | Payer: 59 | Source: Ambulatory Visit | Attending: Physician Assistant | Admitting: Physician Assistant

## 2014-01-01 ENCOUNTER — Ambulatory Visit (INDEPENDENT_AMBULATORY_CARE_PROVIDER_SITE_OTHER): Payer: 59 | Admitting: Physician Assistant

## 2014-01-01 VITALS — BP 98/64 | HR 84 | Temp 97.7°F | Resp 14 | Ht 64.5 in | Wt 141.5 lb

## 2014-01-01 DIAGNOSIS — R109 Unspecified abdominal pain: Secondary | ICD-10-CM | POA: Insufficient documentation

## 2014-01-01 DIAGNOSIS — R195 Other fecal abnormalities: Secondary | ICD-10-CM

## 2014-01-01 DIAGNOSIS — N200 Calculus of kidney: Secondary | ICD-10-CM | POA: Insufficient documentation

## 2014-01-01 HISTORY — DX: Other fecal abnormalities: R19.5

## 2014-01-01 LAB — CBC WITH DIFFERENTIAL/PLATELET
BASOS ABS: 0 10*3/uL (ref 0.0–0.1)
BASOS PCT: 1 % (ref 0–1)
Eosinophils Absolute: 0 10*3/uL (ref 0.0–0.7)
Eosinophils Relative: 1 % (ref 0–5)
HCT: 39.2 % (ref 36.0–46.0)
Hemoglobin: 13.7 g/dL (ref 12.0–15.0)
LYMPHS PCT: 23 % (ref 12–46)
Lymphs Abs: 1.1 10*3/uL (ref 0.7–4.0)
MCH: 30 pg (ref 26.0–34.0)
MCHC: 34.9 g/dL (ref 30.0–36.0)
MCV: 85.8 fL (ref 78.0–100.0)
Monocytes Absolute: 0.3 10*3/uL (ref 0.1–1.0)
Monocytes Relative: 7 % (ref 3–12)
NEUTROS ABS: 3.2 10*3/uL (ref 1.7–7.7)
Neutrophils Relative %: 68 % (ref 43–77)
PLATELETS: 331 10*3/uL (ref 150–400)
RBC: 4.57 MIL/uL (ref 3.87–5.11)
RDW: 13.3 % (ref 11.5–15.5)
WBC: 4.7 10*3/uL (ref 4.0–10.5)

## 2014-01-01 NOTE — Patient Instructions (Signed)
Please continue your probiotics.  Make sure you are getting plenty of fluids.  Tylenol for pain.  Please go to lab.  I will call you with your results.  Then proceed downstairs for an x-ray of your abdomen.  If symptoms acutely worsen or if vomiting occurs before our workup can be completed, proceed to ER or Urgent Care

## 2014-01-01 NOTE — Progress Notes (Signed)
Patient presents to clinic today c/o some loose stools and diffuse abdominal cramping over the past few days.  Patient endorses 3 loose stools per day.  Denies tenesmus, melena or hematochezia.  Denies fever, chills, nausea or vomiting.  Patient recently with diverticulitis but has completed course of Ciprofloxacin and Metronidazole.  Endorses she has had problems with loose stools before after taking Cipro.   Past Medical History  Diagnosis Date  . Anxiety   . Hyperlipidemia   . Diverticulosis   . IBS (irritable bowel syndrome)   . Depression     Current Outpatient Prescriptions on File Prior to Visit  Medication Sig Dispense Refill  . ALPRAZolam (XANAX) 0.25 MG tablet Take 1 tablet (0.25 mg total) by mouth at bedtime as needed for anxiety or sleep.  30 tablet  0  . ondansetron (ZOFRAN-ODT) 8 MG disintegrating tablet Take 1 tablet (8 mg total) by mouth every 8 (eight) hours as needed for nausea.  20 tablet  3  . oxyCODONE-acetaminophen (PERCOCET) 10-325 MG per tablet Take 1 tablet by mouth every 4 (four) hours as needed for pain.      . pantoprazole (PROTONIX) 40 MG tablet Take 1 tablet (40 mg total) by mouth 2 (two) times daily before a meal.  60 tablet  3   No current facility-administered medications on file prior to visit.    Allergies  Allergen Reactions  . Ciprofloxacin     Facial numbness  . Peanut Butter Flavor     N/V  . Shellfish Allergy Swelling    Family History  Problem Relation Age of Onset  . Cancer Mother     renal cell carcinoma  . Diabetes Mother   . Heart disease Father     first MI in 49s  . Cancer Sister     cervical cancer  . Heart disease Brother     first MI in 67s  . Alcohol abuse Sister   . Fibromyalgia Sister   . Arthritis Sister     osteoarthritis  . Irritable bowel syndrome Sister   . Leukemia      grandson    History   Social History  . Marital Status: Legally Separated    Spouse Name: N/A    Number of Children: 3  . Years of  Education: N/A   Occupational History  . Student    Social History Main Topics  . Smoking status: Former Smoker -- 0.80 packs/day for 20 years    Types: Cigarettes    Quit date: 08/13/1985  . Smokeless tobacco: Never Used  . Alcohol Use: Yes     Comment: rare  . Drug Use: No  . Sexual Activity: None   Other Topics Concern  . None   Social History Narrative   Lives with and grandchild (she is foster parent for grand child)Completed school full time.   Caffeine use; occasional   Works in Personal assistant, Economist.   In process of divorce.           Review of Systems - See HPI.  All other ROS are negative.  BP 98/64  Pulse 84  Temp(Src) 97.7 F (36.5 C) (Oral)  Resp 14  Ht 5' 4.5" (1.638 m)  Wt 141 lb 8 oz (64.184 kg)  BMI 23.92 kg/m2  SpO2 98%  Physical Exam  Vitals reviewed. Constitutional: She is oriented to person, place, and time and well-developed, well-nourished, and in no distress.  HENT:  Head: Normocephalic and atraumatic.  Eyes:  Conjunctivae are normal. Pupils are equal, round, and reactive to light.  Neck: Neck supple.  Cardiovascular: Normal rate, regular rhythm, normal heart sounds and intact distal pulses.   Pulmonary/Chest: Effort normal and breath sounds normal. No respiratory distress. She has no wheezes. She has no rales. She exhibits no tenderness.  Abdominal: Soft. Bowel sounds are normal. She exhibits no distension and no mass. There is no rebound and no guarding.  Mild diffuse tenderness with deep palpation.  No focal tenderness, rebound tenderness or guarding noted on examination.  Neurological: She is alert and oriented to person, place, and time.  Skin: Skin is warm and dry. No rash noted.  Psychiatric: Affect normal.    Recent Results (from the past 2160 hour(s))  CBC WITH DIFFERENTIAL     Status: None   Collection Time    12/21/13  2:17 PM      Result Value Ref Range   WBC 6.2  4.0 - 10.5 K/uL   RBC 4.43  3.87 - 5.11  Mil/uL   Hemoglobin 13.5  12.0 - 15.0 g/dL   HCT 40.0  36.0 - 46.0 %   MCV 90.4  78.0 - 100.0 fl   MCHC 33.8  30.0 - 36.0 g/dL   RDW 12.6  11.5 - 15.5 %   Platelets 377.0  150.0 - 400.0 K/uL   Neutrophils Relative % 67.4  43.0 - 77.0 %   Lymphocytes Relative 24.5  12.0 - 46.0 %   Monocytes Relative 6.9  3.0 - 12.0 %   Eosinophils Relative 0.7  0.0 - 5.0 %   Basophils Relative 0.5  0.0 - 3.0 %   Neutro Abs 4.2  1.4 - 7.7 K/uL   Lymphs Abs 1.5  0.7 - 4.0 K/uL   Monocytes Absolute 0.4  0.1 - 1.0 K/uL   Eosinophils Absolute 0.0  0.0 - 0.7 K/uL   Basophils Absolute 0.0  0.0 - 0.1 K/uL  COMPREHENSIVE METABOLIC PANEL     Status: Abnormal   Collection Time    12/21/13  2:17 PM      Result Value Ref Range   Sodium 138  135 - 145 mEq/L   Potassium 3.6  3.5 - 5.1 mEq/L   Chloride 101  96 - 112 mEq/L   CO2 28  19 - 32 mEq/L   Glucose, Bld 117 (*) 70 - 99 mg/dL   BUN 16  6 - 23 mg/dL   Creatinine, Ser 0.9  0.4 - 1.2 mg/dL   Total Bilirubin 0.6  0.2 - 1.2 mg/dL   Alkaline Phosphatase 50  39 - 117 U/L   AST 23  0 - 37 U/L   ALT 19  0 - 35 U/L   Total Protein 7.5  6.0 - 8.3 g/dL   Albumin 4.3  3.5 - 5.2 g/dL   Calcium 9.4  8.4 - 10.5 mg/dL   GFR 65.14  >60.00 mL/min  HEMOGLOBIN A1C     Status: None   Collection Time    12/21/13  2:17 PM      Result Value Ref Range   Hemoglobin A1C 5.6  4.6 - 6.5 %   Comment: Glycemic Control Guidelines for People with Diabetes:Non Diabetic:  <6%Goal of Therapy: <7%Additional Action Suggested:  >8%    Assessment/Plan: Loose stools Very mild tenderness.  Not overly concerned about diverticulitis as this cramping feels different from previous acute episodes.  Concern for intolerance to Ciprofloxacin versus C. Diff giving recent antibiotics.  Will obtain CBC, CMP, Stool  culture, O/P, C. Diff assay. Hemoccult guaiac negative. Will also obtain x-ray of abdomen to r/o free air in abdomen.  Patient advised to take a probiotic.  Tylenol for pain.  Stay hydrated.   If symptoms acutely worsen before workup is complete, please proceed directly to ER.

## 2014-01-01 NOTE — Assessment & Plan Note (Addendum)
Very mild tenderness.  Not overly concerned about diverticulitis as this cramping feels different from previous acute episodes.  Concern for intolerance to Ciprofloxacin versus C. Diff giving recent antibiotics.  Will obtain CBC, CMP, Stool culture, O/P, C. Diff assay. Hemoccult guaiac negative. Will also obtain x-ray of abdomen to r/o free air in abdomen.  Patient advised to take a probiotic.  Tylenol for pain.  Stay hydrated.  If symptoms acutely worsen before workup is complete, please proceed directly to ER.

## 2014-01-01 NOTE — Progress Notes (Signed)
Pre visit review using our clinic review tool, if applicable. No additional management support is needed unless otherwise documented below in the visit note/SLS  

## 2014-01-02 LAB — COMPREHENSIVE METABOLIC PANEL
ALK PHOS: 45 U/L (ref 39–117)
ALT: 20 U/L (ref 0–35)
AST: 24 U/L (ref 0–37)
Albumin: 4.3 g/dL (ref 3.5–5.2)
BUN: 15 mg/dL (ref 6–23)
CALCIUM: 9.6 mg/dL (ref 8.4–10.5)
CHLORIDE: 101 meq/L (ref 96–112)
CO2: 30 mEq/L (ref 19–32)
Creat: 0.72 mg/dL (ref 0.50–1.10)
Glucose, Bld: 87 mg/dL (ref 70–99)
POTASSIUM: 4.8 meq/L (ref 3.5–5.3)
Sodium: 139 mEq/L (ref 135–145)
Total Bilirubin: 0.7 mg/dL (ref 0.2–1.2)
Total Protein: 7.3 g/dL (ref 6.0–8.3)

## 2014-01-02 LAB — C. DIFFICILE GDH AND TOXIN A/B
C. difficile GDH: NOT DETECTED
C. difficile Toxin A/B: NOT DETECTED

## 2014-01-05 LAB — OVA AND PARASITE EXAMINATION: OP: NONE SEEN

## 2014-01-06 LAB — STOOL CULTURE

## 2014-01-18 ENCOUNTER — Other Ambulatory Visit: Payer: Self-pay | Admitting: Family

## 2014-01-19 ENCOUNTER — Other Ambulatory Visit: Payer: Self-pay | Admitting: Family

## 2014-01-19 ENCOUNTER — Telehealth: Payer: Self-pay | Admitting: *Deleted

## 2014-01-19 ENCOUNTER — Ambulatory Visit (INDEPENDENT_AMBULATORY_CARE_PROVIDER_SITE_OTHER): Payer: 59

## 2014-01-19 DIAGNOSIS — Z0000 Encounter for general adult medical examination without abnormal findings: Secondary | ICD-10-CM

## 2014-01-19 DIAGNOSIS — Z1231 Encounter for screening mammogram for malignant neoplasm of breast: Secondary | ICD-10-CM

## 2014-01-19 NOTE — Telephone Encounter (Signed)
Pt called and wants to go to [facility] in [place] for DEXA tomorrow morning. I have pended order.  Please advise.

## 2014-01-19 NOTE — Telephone Encounter (Signed)
rx signed

## 2014-01-20 ENCOUNTER — Telehealth: Payer: Self-pay | Admitting: Family

## 2014-01-20 ENCOUNTER — Ambulatory Visit (INDEPENDENT_AMBULATORY_CARE_PROVIDER_SITE_OTHER): Payer: 59

## 2014-01-20 DIAGNOSIS — Z0000 Encounter for general adult medical examination without abnormal findings: Secondary | ICD-10-CM

## 2014-01-20 DIAGNOSIS — M899 Disorder of bone, unspecified: Secondary | ICD-10-CM

## 2014-01-20 DIAGNOSIS — M949 Disorder of cartilage, unspecified: Secondary | ICD-10-CM

## 2014-01-20 DIAGNOSIS — M8580 Other specified disorders of bone density and structure, unspecified site: Secondary | ICD-10-CM | POA: Insufficient documentation

## 2014-01-20 HISTORY — DX: Other specified disorders of bone density and structure, unspecified site: M85.80

## 2014-01-20 NOTE — Telephone Encounter (Signed)
Bone density shows osteopenia. She should add caltrate 600mg  + D twice daily.  Get regular weight bearing exercise. Obtain vitamin D level- dx osteopenia.  Repeat dexa ain 2-3 years.

## 2014-01-21 ENCOUNTER — Encounter: Payer: Self-pay | Admitting: Physician Assistant

## 2014-01-21 ENCOUNTER — Ambulatory Visit (INDEPENDENT_AMBULATORY_CARE_PROVIDER_SITE_OTHER): Payer: 59 | Admitting: Physician Assistant

## 2014-01-21 VITALS — BP 108/70 | HR 93 | Temp 97.9°F | Resp 16 | Ht 64.5 in | Wt 140.5 lb

## 2014-01-21 DIAGNOSIS — R109 Unspecified abdominal pain: Secondary | ICD-10-CM

## 2014-01-21 DIAGNOSIS — R350 Frequency of micturition: Secondary | ICD-10-CM

## 2014-01-21 DIAGNOSIS — R8290 Unspecified abnormal findings in urine: Secondary | ICD-10-CM

## 2014-01-21 DIAGNOSIS — R82998 Other abnormal findings in urine: Secondary | ICD-10-CM

## 2014-01-21 LAB — POCT URINALYSIS DIPSTICK
Glucose, UA: NEGATIVE
NITRITE UA: NEGATIVE
PH UA: 5
UROBILINOGEN UA: 0.2

## 2014-01-21 LAB — CBC
HCT: 38.8 % (ref 36.0–46.0)
Hemoglobin: 13.6 g/dL (ref 12.0–15.0)
MCH: 30.2 pg (ref 26.0–34.0)
MCHC: 35.1 g/dL (ref 30.0–36.0)
MCV: 86 fL (ref 78.0–100.0)
PLATELETS: 313 10*3/uL (ref 150–400)
RBC: 4.51 MIL/uL (ref 3.87–5.11)
RDW: 13.1 % (ref 11.5–15.5)
WBC: 5.6 10*3/uL (ref 4.0–10.5)

## 2014-01-21 MED ORDER — SULFAMETHOXAZOLE-TRIMETHOPRIM 800-160 MG PO TABS: 1.0000 | ORAL_TABLET | Freq: Two times a day (BID) | ORAL | Status: DC

## 2014-01-21 MED ORDER — TRAMADOL HCL 50 MG PO TABS: 50.0000 mg | ORAL_TABLET | Freq: Three times a day (TID) | ORAL | Status: DC | PRN

## 2014-01-21 MED ORDER — NITROFURANTOIN MONOHYD MACRO 100 MG PO CAPS: 100.0000 mg | ORAL_CAPSULE | Freq: Two times a day (BID) | ORAL | Status: DC

## 2014-01-21 NOTE — Progress Notes (Signed)
Pre visit review using our clinic review tool, if applicable. No additional management support is needed unless otherwise documented below in the visit note/SLS  

## 2014-01-21 NOTE — Telephone Encounter (Signed)
Patient called in requesting results.

## 2014-01-21 NOTE — Patient Instructions (Signed)
Please obtain labs.  Then stop by the front desk to schedule ultrasound.  Take antibiotic as directed.  Take a daily probiotic.  Use Tramadol for severe pain.  If symptoms acutely worsen before workup can be completed, please proceed to the ER.

## 2014-01-22 ENCOUNTER — Telehealth: Payer: Self-pay | Admitting: Physician Assistant

## 2014-01-22 ENCOUNTER — Ambulatory Visit (HOSPITAL_BASED_OUTPATIENT_CLINIC_OR_DEPARTMENT_OTHER)
Admission: RE | Admit: 2014-01-22 | Discharge: 2014-01-22 | Disposition: A | Discharge status: Home / Self Care | Payer: 59 | Source: Ambulatory Visit | Attending: Physician Assistant | Admitting: Physician Assistant

## 2014-01-22 DIAGNOSIS — K5731 Diverticulosis of large intestine without perforation or abscess with bleeding: Secondary | ICD-10-CM

## 2014-01-22 DIAGNOSIS — K589 Irritable bowel syndrome without diarrhea: Secondary | ICD-10-CM | POA: Insufficient documentation

## 2014-01-22 DIAGNOSIS — R109 Unspecified abdominal pain: Secondary | ICD-10-CM

## 2014-01-22 DIAGNOSIS — K5730 Diverticulosis of large intestine without perforation or abscess without bleeding: Secondary | ICD-10-CM

## 2014-01-22 DIAGNOSIS — R1013 Epigastric pain: Secondary | ICD-10-CM | POA: Insufficient documentation

## 2014-01-22 DIAGNOSIS — E785 Hyperlipidemia, unspecified: Secondary | ICD-10-CM | POA: Insufficient documentation

## 2014-01-22 LAB — COMPREHENSIVE METABOLIC PANEL
ALK PHOS: 49 U/L (ref 39–117)
ALT: 17 U/L (ref 0–35)
AST: 20 U/L (ref 0–37)
Albumin: 4.6 g/dL (ref 3.5–5.2)
BILIRUBIN TOTAL: 0.5 mg/dL (ref 0.2–1.2)
BUN: 17 mg/dL (ref 6–23)
CO2: 28 mEq/L (ref 19–32)
CREATININE: 0.76 mg/dL (ref 0.50–1.10)
Calcium: 9.7 mg/dL (ref 8.4–10.5)
Chloride: 102 mEq/L (ref 96–112)
Glucose, Bld: 81 mg/dL (ref 70–99)
Potassium: 4.6 mEq/L (ref 3.5–5.3)
SODIUM: 139 meq/L (ref 135–145)
TOTAL PROTEIN: 7.3 g/dL (ref 6.0–8.3)

## 2014-01-22 LAB — LIPASE: Lipase: 27 U/L (ref 0–75)

## 2014-01-22 LAB — URINALYSIS, MICROSCOPIC ONLY
BACTERIA UA: NONE SEEN
Casts: NONE SEEN
Crystals: NONE SEEN
SQUAMOUS EPITHELIAL / LPF: NONE SEEN

## 2014-01-22 LAB — CULTURE, URINE COMPREHENSIVE
COLONY COUNT: NO GROWTH
Organism ID, Bacteria: NO GROWTH

## 2014-01-22 NOTE — Telephone Encounter (Signed)
Referral placed.

## 2014-01-22 NOTE — Telephone Encounter (Signed)
Left detailed message on home # and to call if any questions. Lab order entered.

## 2014-01-22 NOTE — Telephone Encounter (Signed)
[place] is unremarkable for abnormality. I recommend she let me set her up with a GI specialist. I will make the referral upon her approval.

## 2014-01-22 NOTE — Telephone Encounter (Signed)
Patient informed, understood & agreed; please place GI referral/SLS

## 2014-01-24 DIAGNOSIS — R1084 Generalized abdominal pain: Secondary | ICD-10-CM

## 2014-01-24 HISTORY — DX: Generalized abdominal pain: R10.84

## 2014-01-24 NOTE — Progress Notes (Signed)
Patient presents to clinic today c/o right-sided abdominal discomfort associated with mild urinary frequency.  Patient denies nausea or vomiting.  Denies flank pain.  Denies hematuria, urgency or dysuria.  Patient is s/p hysterectomy.  Denies vaginal pain or bleeding.  Patient endorses mild constipation.  Denies melena, hematochezia or tenesmus. Denies recent change in diet.  Patient seen in clinic 1 week ago with similar complaints.  Workup unremarkable at that time except for punctate nephrolithiasis within left ureter that was nonobstructive.    Past Medical History  Diagnosis Date  . Anxiety   . Hyperlipidemia   . Diverticulosis   . IBS (irritable bowel syndrome)   . Depression     Current Outpatient Prescriptions on File Prior to Visit  Medication Sig Dispense Refill  . ALPRAZolam (XANAX) 0.25 MG tablet Take 1 tablet (0.25 mg total) by mouth at bedtime as needed for anxiety or sleep.  30 tablet  0  . Calcium Carbonate-Vitamin D (CALTRATE 600+D) 600-400 MG-UNIT per tablet Take 1 tablet by mouth 2 (two) times daily.      . ondansetron (ZOFRAN-ODT) 8 MG disintegrating tablet Take 1 tablet (8 mg total) by mouth every 8 (eight) hours as needed for nausea.  20 tablet  3  . oxyCODONE-acetaminophen (PERCOCET) 10-325 MG per tablet Take 1 tablet by mouth every 4 (four) hours as needed for pain.      . pantoprazole (PROTONIX) 40 MG tablet Take 1 tablet (40 mg total) by mouth 2 (two) times daily before a meal.  60 tablet  3   No current facility-administered medications on file prior to visit.    Allergies  Allergen Reactions  . Ciprofloxacin     Facial numbness  . Peanut Butter Flavor     N/V  . Shellfish Allergy Swelling    Family History  Problem Relation Age of Onset  . Cancer Mother     renal cell carcinoma  . Diabetes Mother   . Heart disease Father     first MI in 41s  . Cancer Sister     cervical cancer  . Heart disease Brother     first MI in 3s  . Alcohol abuse Sister    . Fibromyalgia Sister   . Arthritis Sister     osteoarthritis  . Irritable bowel syndrome Sister   . Leukemia      grandson    History   Social History  . Marital Status: Divorced    Spouse Name: N/A    Number of Children: 3  . Years of Education: N/A   Occupational History  . Student    Social History Main Topics  . Smoking status: Former Smoker -- 0.80 packs/day for 20 years    Types: Cigarettes    Quit date: 08/13/1985  . Smokeless tobacco: Never Used  . Alcohol Use: Yes     Comment: rare  . Drug Use: No  . Sexual Activity: None   Other Topics Concern  . None   Social History Narrative   Lives with and grandchild (she is foster parent for grand child)Completed school full time.   Caffeine use; occasional   Works in Personal assistant, Economist.   In process of divorce.            Review of Systems - See HPI.  All other ROS are negative.  BP 108/70  Pulse 93  Temp(Src) 97.9 F (36.6 C) (Oral)  Resp 16  Ht 5' 4.5" (1.638 m)  Wt  140 lb 8 oz (63.73 kg)  BMI 23.75 kg/m2  SpO2 98%  Physical Exam  Vitals reviewed. Constitutional: She is oriented to person, place, and time and well-developed, well-nourished, and in no distress.  HENT:  Head: Normocephalic and atraumatic.  Eyes: Conjunctivae are normal.  Cardiovascular: Normal rate, regular rhythm, normal heart sounds and intact distal pulses.   Pulmonary/Chest: Effort normal and breath sounds normal. No respiratory distress. She has no wheezes. She has no rales. She exhibits no tenderness.  Abdominal: Soft. Bowel sounds are normal. She exhibits no distension and no mass. There is no tenderness. There is no rebound and no guarding.  Neurological: She is alert and oriented to person, place, and time.  Skin: Skin is warm and dry. No rash noted.  Psychiatric: Affect normal.    Recent Results (from the past 2160 hour(s))  CBC WITH DIFFERENTIAL     Status: None   Collection Time    12/21/13   2:17 PM      Result Value Ref Range   WBC 6.2  4.0 - 10.5 K/uL   RBC 4.43  3.87 - 5.11 Mil/uL   Hemoglobin 13.5  12.0 - 15.0 g/dL   HCT 40.0  36.0 - 46.0 %   MCV 90.4  78.0 - 100.0 fl   MCHC 33.8  30.0 - 36.0 g/dL   RDW 12.6  11.5 - 15.5 %   Platelets 377.0  150.0 - 400.0 K/uL   Neutrophils Relative % 67.4  43.0 - 77.0 %   Lymphocytes Relative 24.5  12.0 - 46.0 %   Monocytes Relative 6.9  3.0 - 12.0 %   Eosinophils Relative 0.7  0.0 - 5.0 %   Basophils Relative 0.5  0.0 - 3.0 %   Neutro Abs 4.2  1.4 - 7.7 K/uL   Lymphs Abs 1.5  0.7 - 4.0 K/uL   Monocytes Absolute 0.4  0.1 - 1.0 K/uL   Eosinophils Absolute 0.0  0.0 - 0.7 K/uL   Basophils Absolute 0.0  0.0 - 0.1 K/uL  COMPREHENSIVE METABOLIC PANEL     Status: Abnormal   Collection Time    12/21/13  2:17 PM      Result Value Ref Range   Sodium 138  135 - 145 mEq/L   Potassium 3.6  3.5 - 5.1 mEq/L   Chloride 101  96 - 112 mEq/L   CO2 28  19 - 32 mEq/L   Glucose, Bld 117 (*) 70 - 99 mg/dL   BUN 16  6 - 23 mg/dL   Creatinine, Ser 0.9  0.4 - 1.2 mg/dL   Total Bilirubin 0.6  0.2 - 1.2 mg/dL   Alkaline Phosphatase 50  39 - 117 U/L   AST 23  0 - 37 U/L   ALT 19  0 - 35 U/L   Total Protein 7.5  6.0 - 8.3 g/dL   Albumin 4.3  3.5 - 5.2 g/dL   Calcium 9.4  8.4 - 10.5 mg/dL   GFR 65.14  >60.00 mL/min  HEMOGLOBIN A1C     Status: None   Collection Time    12/21/13  2:17 PM      Result Value Ref Range   Hemoglobin A1C 5.6  4.6 - 6.5 %   Comment: Glycemic Control Guidelines for People with Diabetes:Non Diabetic:  <6%Goal of Therapy: <7%Additional Action Suggested:  >8%   CBC WITH DIFFERENTIAL     Status: None   Collection Time    01/01/14 10:57 AM  Result Value Ref Range   WBC 4.7  4.0 - 10.5 K/uL   RBC 4.57  3.87 - 5.11 MIL/uL   Hemoglobin 13.7  12.0 - 15.0 g/dL   HCT 39.2  36.0 - 46.0 %   MCV 85.8  78.0 - 100.0 fL   MCH 30.0  26.0 - 34.0 pg   MCHC 34.9  30.0 - 36.0 g/dL   RDW 13.3  11.5 - 15.5 %   Platelets 331  150 - 400  K/uL   Neutrophils Relative % 68  43 - 77 %   Neutro Abs 3.2  1.7 - 7.7 K/uL   Lymphocytes Relative 23  12 - 46 %   Lymphs Abs 1.1  0.7 - 4.0 K/uL   Monocytes Relative 7  3 - 12 %   Monocytes Absolute 0.3  0.1 - 1.0 K/uL   Eosinophils Relative 1  0 - 5 %   Eosinophils Absolute 0.0  0.0 - 0.7 K/uL   Basophils Relative 1  0 - 1 %   Basophils Absolute 0.0  0.0 - 0.1 K/uL   Smear Review Criteria for review not met    COMPREHENSIVE METABOLIC PANEL     Status: None   Collection Time    01/01/14 10:57 AM      Result Value Ref Range   Sodium 139  135 - 145 mEq/L   Potassium 4.8  3.5 - 5.3 mEq/L   Chloride 101  96 - 112 mEq/L   CO2 30  19 - 32 mEq/L   Glucose, Bld 87  70 - 99 mg/dL   BUN 15  6 - 23 mg/dL   Creat 0.72  0.50 - 1.10 mg/dL   Total Bilirubin 0.7  0.2 - 1.2 mg/dL   Alkaline Phosphatase 45  39 - 117 U/L   AST 24  0 - 37 U/L   ALT 20  0 - 35 U/L   Total Protein 7.3  6.0 - 8.3 g/dL   Albumin 4.3  3.5 - 5.2 g/dL   Calcium 9.6  8.4 - 10.5 mg/dL  STOOL CULTURE     Status: None   Collection Time    01/01/14 11:10 AM      Result Value Ref Range   Organism ID, Bacteria No Salmonella,Shigella,Campylobacter,Yersinia,or     Organism ID, Bacteria No E.coli [id number]:[id number] isolated.    OVA AND PARASITE EXAMINATION     Status: None   Collection Time    01/01/14 11:12 AM      Result Value Ref Range   OP No Ova or Parasites Seen      OP Moderate     OP Yeast    C. DIFFICILE GDH AND TOXIN A/B     Status: None   Collection Time    01/01/14 11:13 AM      Result Value Ref Range   C. difficile GDH Not Detected     C. difficile Toxin A/B Not Detected     Comment: No Toxigenic C. difficile Detected   Source STOOL    CULTURE, URINE COMPREHENSIVE     Status: None   Collection Time    01/21/14  2:34 PM      Result Value Ref Range   Colony Count NO GROWTH     Organism ID, Bacteria NO GROWTH    URINALYSIS, MICROSCOPIC ONLY     Status: None   Collection Time    01/21/14  2:34 PM      Result  Value Ref  Range   Squamous Epithelial / LPF NONE SEEN  RARE   Crystals NONE SEEN  NONE SEEN   Casts NONE SEEN  NONE SEEN   WBC, UA 0-2  <3 WBC/hpf   RBC / HPF 0-2  <3 RBC/hpf   Bacteria, UA NONE SEEN  RARE  POCT URINALYSIS DIPSTICK     Status: None   Collection Time    01/21/14  2:35 PM      Result Value Ref Range   Color, UA gold     Clarity, UA cloudy     Glucose, UA neg     Bilirubin, UA small     Ketones, UA trace     Spec Grav, UA >=1.030     Blood, UA moderate     pH, UA 5.0     Protein, UA trace     Urobilinogen, UA 0.2     Nitrite, UA neg     Leukocytes, UA Trace    CBC     Status: None   Collection Time    01/21/14  2:57 PM      Result Value Ref Range   WBC 5.6  4.0 - 10.5 K/uL   RBC 4.51  3.87 - 5.11 MIL/uL   Hemoglobin 13.6  12.0 - 15.0 g/dL   HCT 38.8  36.0 - 46.0 %   MCV 86.0  78.0 - 100.0 fL   MCH 30.2  26.0 - 34.0 pg   MCHC 35.1  30.0 - 36.0 g/dL   RDW 13.1  11.5 - 15.5 %   Platelets 313  150 - 400 K/uL  COMPREHENSIVE METABOLIC PANEL     Status: None   Collection Time    01/21/14  2:57 PM      Result Value Ref Range   Sodium 139  135 - 145 mEq/L   Potassium 4.6  3.5 - 5.3 mEq/L   Chloride 102  96 - 112 mEq/L   CO2 28  19 - 32 mEq/L   Glucose, Bld 81  70 - 99 mg/dL   BUN 17  6 - 23 mg/dL   Creat 0.76  0.50 - 1.10 mg/dL   Total Bilirubin 0.5  0.2 - 1.2 mg/dL   Alkaline Phosphatase 49  39 - 117 U/L   AST 20  0 - 37 U/L   ALT 17  0 - 35 U/L   Total Protein 7.3  6.0 - 8.3 g/dL   Albumin 4.6  3.5 - 5.2 g/dL   Calcium 9.7  8.4 - 10.5 mg/dL  LIPASE     Status: None   Collection Time    01/21/14  2:57 PM      Result Value Ref Range   Lipase 27  0 - 75 U/L    Assessment/Plan: Abdominal pain, unspecified site Physical exam unremarkable. Urine dip with blood and trace LE. Will send for micro and culture. Will repeat CBC and CMP. Recent stool studies unremarkable.  Will proceed with US abdomen/pelvis.  Rx Bactrim for possible UTI.  Will d/c if  indicated by culture. Daily probiotic.  Increase fluid intake.

## 2014-01-24 NOTE — Assessment & Plan Note (Signed)
Physical exam unremarkable. Urine dip with blood and trace LE. Will send for micro and culture. Will repeat CBC and CMP. Recent stool studies unremarkable.  Will proceed with US abdomen/pelvis.  Rx Bactrim for possible UTI.  Will d/c if indicated by culture. Daily probiotic.  Increase fluid intake.

## 2014-01-27 ENCOUNTER — Telehealth: Payer: Self-pay | Admitting: Family

## 2014-01-27 NOTE — Telephone Encounter (Signed)
Patient is requesting bone density results.  Also, she states that the wrong ultrasound was ordered for her and she would like another one??

## 2014-01-28 ENCOUNTER — Telehealth: Payer: Self-pay | Admitting: Family

## 2014-01-28 DIAGNOSIS — R109 Unspecified abdominal pain: Secondary | ICD-10-CM

## 2014-01-28 NOTE — Telephone Encounter (Signed)
Returning call.

## 2014-01-28 NOTE — Telephone Encounter (Signed)
Patient wants an ultrasound of her lower abd with her female organs.  She thinks the pain in her lower abd is coming from perhaps her female organs.  I read her the dexa results and she understands that she is to take caltrate 600 mg plus d 2 times a day  and get exercise and come to have her vitamin d level checked and repeat bone scan in 2 years.

## 2014-01-28 NOTE — Telephone Encounter (Signed)
LMOM with contact name and number for return call RE: DEXA results and clarify [place] needs per provider instructions/SLS

## 2014-01-28 NOTE — Telephone Encounter (Signed)
DUPLICATE NOTE

## 2014-01-28 NOTE — Telephone Encounter (Signed)
Per previous note from [facility] -- Bone density shows osteopenia. She should add caltrate 600mg  + D twice daily. Get regular weight bearing exercise. Obtain vitamin D level- dx osteopenia. Repeat dexa ain 2-3 years.   The only [place] in the system was for an abdominal US that was obtained due to her diffuse abdominal complaints.  Please ask her why she is requesting another [place].  Also GI has left her several messages for an appointment but patient has not  Called them back to schedule.

## 2014-01-28 NOTE — Telephone Encounter (Signed)
I don't see results.  Can you see anything?

## 2014-01-29 ENCOUNTER — Ambulatory Visit (INDEPENDENT_AMBULATORY_CARE_PROVIDER_SITE_OTHER): Payer: 59

## 2014-01-29 ENCOUNTER — Other Ambulatory Visit (HOSPITAL_COMMUNITY): Payer: Self-pay | Admitting: Neurology

## 2014-01-29 DIAGNOSIS — M549 Dorsalgia, unspecified: Secondary | ICD-10-CM

## 2014-01-29 DIAGNOSIS — R109 Unspecified abdominal pain: Secondary | ICD-10-CM

## 2014-01-29 DIAGNOSIS — Z9889 Other specified postprocedural states: Secondary | ICD-10-CM

## 2014-01-29 DIAGNOSIS — R200 Anesthesia of skin: Secondary | ICD-10-CM

## 2014-01-29 NOTE — Telephone Encounter (Signed)
Left detailed message informing patient of this. °

## 2014-01-29 NOTE — Telephone Encounter (Signed)
Inform patient [place] has been ordered.  She will be contacted for imaging.

## 2014-02-04 ENCOUNTER — Ambulatory Visit (HOSPITAL_COMMUNITY)
Admission: RE | Admit: 2014-02-04 | Discharge: 2014-02-04 | Disposition: A | Discharge status: Home / Self Care | Payer: 59 | Source: Ambulatory Visit | Attending: Neurology | Admitting: Neurology

## 2014-02-04 DIAGNOSIS — R200 Anesthesia of skin: Secondary | ICD-10-CM

## 2014-02-04 DIAGNOSIS — M549 Dorsalgia, unspecified: Secondary | ICD-10-CM

## 2014-02-05 ENCOUNTER — Telehealth: Payer: Self-pay | Admitting: Family

## 2014-02-05 NOTE — Telephone Encounter (Signed)
Left detailed message on home# per result note and to call if any questions.

## 2014-02-05 NOTE — Telephone Encounter (Signed)
Patient left message requesting call back with results of her ultrasound

## 2014-02-16 ENCOUNTER — Telehealth: Payer: Self-pay | Admitting: Family

## 2014-02-16 MED ORDER — LORAZEPAM 1 MG PO TABS
ORAL_TABLET | ORAL | Status: DC
Start: 2014-02-16 — End: 2014-03-02

## 2014-02-16 NOTE — Telephone Encounter (Signed)
OK to call in ativan 1mg  30 minutes prior to MRI- #1 tab with zero refills. She will need to have someone drive her to and from MRI if she plans to take ativan.

## 2014-02-16 NOTE — Telephone Encounter (Signed)
Patient states that she has an MRI scheduled for tomorrow at 10:30 and would like to know if something could be called in to help her relax. She states that she is claustrophobic. [facility] on [address]

## 2014-02-16 NOTE — Telephone Encounter (Signed)
Rx called to [facility] at [facility]. Left detailed message on pt's home # and to call if any questions.

## 2014-02-17 ENCOUNTER — Ambulatory Visit (INDEPENDENT_AMBULATORY_CARE_PROVIDER_SITE_OTHER): Payer: 59

## 2014-02-17 DIAGNOSIS — M5124 Other intervertebral disc displacement, thoracic region: Secondary | ICD-10-CM

## 2014-02-17 DIAGNOSIS — K7689 Other specified diseases of liver: Secondary | ICD-10-CM

## 2014-02-18 ENCOUNTER — Telehealth: Payer: Self-pay | Admitting: Family

## 2014-02-18 NOTE — Telephone Encounter (Signed)
Received call last night from our office, request call back today. Regards to test results

## 2014-02-18 NOTE — Telephone Encounter (Signed)
Call Documentation      Ronny Flurry, CMA at 02/16/2014  4:51 PM      Status: Signed            Rx called to [facility] at [facility]. Left detailed message on pt's home # and to call if any questions.        Debbrah Alar, NP at 02/16/2014  3:29 PM      Status: Signed            OK to call in ativan 1mg  30 minutes prior to MRI- #1 tab with zero refills. She will need to have someone drive her to and from MRI if she plans to take ativan.          Irven [place] at 02/16/2014  3:21 PM      Status: Signed            Patient states that she has an MRI scheduled for tomorrow at 10:30 and would like to know if something could be called in to help her relax. She states that she is claustrophobic. [facility] [address]

## 2014-02-19 ENCOUNTER — Telehealth: Payer: Self-pay | Admitting: *Deleted

## 2014-02-19 DIAGNOSIS — M545 Low back pain: Secondary | ICD-10-CM

## 2014-02-19 NOTE — Telephone Encounter (Signed)
Pt left message requesting results from 2 weeks ago and recent MRI.  See lab note re: previous transvaginal / pelvic u/s.  Please advise?  Notes Recorded by Ronny Flurry, CMA on 02/05/2014 at 4:24 PM Left detailed message on home # re: results and need to contact GI for appt and to call if any questions. ------  Notes Recorded by Leeanne Rio, PA-C on 01/29/2014 at 6:07 PM Pelvic and transvaginal US are without abnormal findings. Gi referral has been placed and they have tried to contact her multiple times. It is her responsibility to schedule an appointment. Follow-up with PCP as scheduled.

## 2014-02-19 NOTE — Telephone Encounter (Signed)
Mri shows mild bulging discs.  If she has continued pain, PT referral is an option for her.

## 2014-02-19 NOTE — Telephone Encounter (Signed)
Notified pt and re-iterated results from 02/05/14 note. Gave pt GI # to contact re: GI referral. Pt would like to proceed with PT and would like to do this at the [facility] in [place] if possible.

## 2014-02-20 ENCOUNTER — Emergency Department (HOSPITAL_BASED_OUTPATIENT_CLINIC_OR_DEPARTMENT_OTHER)
Admission: EM | Admit: 2014-02-20 | Discharge: 2014-02-20 | Disposition: A | Discharge status: Home / Self Care | Payer: 59 | Attending: Emergency Medicine | Admitting: Emergency Medicine

## 2014-02-20 ENCOUNTER — Encounter (HOSPITAL_BASED_OUTPATIENT_CLINIC_OR_DEPARTMENT_OTHER): Payer: Self-pay | Admitting: Emergency Medicine

## 2014-02-20 ENCOUNTER — Emergency Department (HOSPITAL_BASED_OUTPATIENT_CLINIC_OR_DEPARTMENT_OTHER): Payer: 59

## 2014-02-20 DIAGNOSIS — R52 Pain, unspecified: Secondary | ICD-10-CM | POA: Insufficient documentation

## 2014-02-20 DIAGNOSIS — F329 Major depressive disorder, single episode, unspecified: Secondary | ICD-10-CM | POA: Insufficient documentation

## 2014-02-20 DIAGNOSIS — R109 Unspecified abdominal pain: Secondary | ICD-10-CM

## 2014-02-20 DIAGNOSIS — K5732 Diverticulitis of large intestine without perforation or abscess without bleeding: Secondary | ICD-10-CM | POA: Insufficient documentation

## 2014-02-20 DIAGNOSIS — Z9851 Tubal ligation status: Secondary | ICD-10-CM | POA: Insufficient documentation

## 2014-02-20 DIAGNOSIS — Z792 Long term (current) use of antibiotics: Secondary | ICD-10-CM | POA: Insufficient documentation

## 2014-02-20 DIAGNOSIS — Z8639 Personal history of other endocrine, nutritional and metabolic disease: Secondary | ICD-10-CM | POA: Insufficient documentation

## 2014-02-20 DIAGNOSIS — Z90710 Acquired absence of both cervix and uterus: Secondary | ICD-10-CM | POA: Insufficient documentation

## 2014-02-20 DIAGNOSIS — R110 Nausea: Secondary | ICD-10-CM | POA: Insufficient documentation

## 2014-02-20 DIAGNOSIS — F411 Generalized anxiety disorder: Secondary | ICD-10-CM | POA: Insufficient documentation

## 2014-02-20 DIAGNOSIS — Z87891 Personal history of nicotine dependence: Secondary | ICD-10-CM | POA: Insufficient documentation

## 2014-02-20 DIAGNOSIS — Z862 Personal history of diseases of the blood and blood-forming organs and certain disorders involving the immune mechanism: Secondary | ICD-10-CM | POA: Insufficient documentation

## 2014-02-20 DIAGNOSIS — Z79899 Other long term (current) drug therapy: Secondary | ICD-10-CM | POA: Insufficient documentation

## 2014-02-20 DIAGNOSIS — F3289 Other specified depressive episodes: Secondary | ICD-10-CM | POA: Insufficient documentation

## 2014-02-20 HISTORY — DX: Diverticulitis of intestine, part unspecified, without perforation or abscess without bleeding: K57.92

## 2014-02-20 LAB — URINALYSIS, ROUTINE W REFLEX MICROSCOPIC
Bilirubin Urine: NEGATIVE
Glucose, UA: NEGATIVE mg/dL
Hgb urine dipstick: NEGATIVE
Ketones, ur: NEGATIVE mg/dL
LEUKOCYTES UA: NEGATIVE
NITRITE: NEGATIVE
PH: 6 (ref 5.0–8.0)
Protein, ur: NEGATIVE mg/dL
Specific Gravity, Urine: 1.006 (ref 1.005–1.030)
Urobilinogen, UA: 0.2 mg/dL (ref 0.0–1.0)

## 2014-02-20 LAB — COMPREHENSIVE METABOLIC PANEL
ALK PHOS: 59 U/L (ref 39–117)
ALT: 13 U/L (ref 0–35)
ANION GAP: 12 (ref 5–15)
AST: 20 U/L (ref 0–37)
Albumin: 4.4 g/dL (ref 3.5–5.2)
BILIRUBIN TOTAL: 0.5 mg/dL (ref 0.3–1.2)
BUN: 14 mg/dL (ref 6–23)
CHLORIDE: 102 meq/L (ref 96–112)
CO2: 27 meq/L (ref 19–32)
CREATININE: 0.9 mg/dL (ref 0.50–1.10)
Calcium: 9.9 mg/dL (ref 8.4–10.5)
GFR calc Af Amer: 78 mL/min — ABNORMAL LOW (ref >90)
GFR, EST NON AFRICAN AMERICAN: 68 mL/min — AB (ref >90)
Glucose, Bld: 97 mg/dL (ref 70–99)
Potassium: 4.3 mEq/L (ref 3.7–5.3)
Sodium: 141 mEq/L (ref 137–147)
Total Protein: 7.6 g/dL (ref 6.0–8.3)

## 2014-02-20 LAB — CBC WITH DIFFERENTIAL/PLATELET
Basophils Absolute: 0 10*3/uL (ref 0.0–0.1)
Basophils Relative: 1 % (ref 0–1)
Eosinophils Absolute: 0 10*3/uL (ref 0.0–0.7)
Eosinophils Relative: 1 % (ref 0–5)
HCT: 37.6 % (ref 36.0–46.0)
HEMOGLOBIN: 13.3 g/dL (ref 12.0–15.0)
LYMPHS PCT: 24 % (ref 12–46)
Lymphs Abs: 1.4 10*3/uL (ref 0.7–4.0)
MCH: 31.3 pg (ref 26.0–34.0)
MCHC: 35.4 g/dL (ref 30.0–36.0)
MCV: 88.5 fL (ref 78.0–100.0)
MONOS PCT: 10 % (ref 3–12)
Monocytes Absolute: 0.6 10*3/uL (ref 0.1–1.0)
NEUTROS ABS: 3.9 10*3/uL (ref 1.7–7.7)
Neutrophils Relative %: 66 % (ref 43–77)
Platelets: 307 10*3/uL (ref 150–400)
RBC: 4.25 MIL/uL (ref 3.87–5.11)
RDW: 12.1 % (ref 11.5–15.5)
WBC: 6 10*3/uL (ref 4.0–10.5)

## 2014-02-20 LAB — LIPASE, BLOOD: LIPASE: 36 U/L (ref 11–59)

## 2014-02-20 MED ORDER — SODIUM CHLORIDE 0.9 % IV SOLN
Freq: Once | INTRAVENOUS | Status: AC
  Administered 2014-02-20: 16:00:00 via INTRAVENOUS

## 2014-02-20 MED ORDER — IOHEXOL 300 MG/ML  SOLN
100.0000 mL | Freq: Once | INTRAMUSCULAR | Status: AC | PRN
  Administered 2014-02-20: 100 mL via INTRAVENOUS

## 2014-02-20 MED ORDER — HYDROCODONE-ACETAMINOPHEN 5-325 MG PO TABS: 2.0000 | ORAL_TABLET | ORAL | Status: DC | PRN

## 2014-02-20 MED ORDER — IOHEXOL 300 MG/ML  SOLN
50.0000 mL | Freq: Once | INTRAMUSCULAR | Status: AC | PRN
  Administered 2014-02-20: 50 mL via ORAL

## 2014-02-20 NOTE — Discharge Instructions (Signed)

## 2014-02-20 NOTE — ED Notes (Signed)
Pt drinking contrast. 

## 2014-02-20 NOTE — ED Provider Notes (Signed)
CSN: [id number]     Arrival date & time 02/20/14  1400 History   First MD Initiated Contact with Patient 02/20/14 1508     Chief Complaint  Patient presents with  . Abdominal Pain     (Consider location/radiation/quality/duration/timing/severity/associated sxs/prior Treatment) Patient is a 63 y.o. female presenting with abdominal pain. The history is provided by the patient. No language interpreter was used.  Abdominal Pain Pain location:  RLQ Pain quality: aching   Pain radiates to:  Does not radiate Pain severity:  Moderate Onset quality:  Gradual Duration:  7 days Timing:  Constant Progression:  Worsening Chronicity:  New Relieved by:  Nothing Worsened by:  Nothing tried Ineffective treatments:  None tried Associated symptoms: nausea   Associated symptoms: no vomiting   Risk factors: no recent hospitalization   Pt reports she has a history of diverticulitis.   Pt reports she is having pain.   No fever no chills.  Past Medical History  Diagnosis Date  . Anxiety   . Hyperlipidemia   . Diverticulosis   . IBS (irritable bowel syndrome)   . Depression   . Diverticulitis    Past Surgical History  Procedure Laterality Date  . Abdominal hysterectomy    . Tubal ligation    . Hemorrhoid surgery    . Tumor removal      Left thigh    Family History  Problem Relation Age of Onset  . Cancer Mother     renal cell carcinoma  . Diabetes Mother   . Heart disease Father     first MI in 4s  . Cancer Sister     cervical cancer  . Heart disease Brother     first MI in 6s  . Alcohol abuse Sister   . Fibromyalgia Sister   . Arthritis Sister     osteoarthritis  . Irritable bowel syndrome Sister   . Leukemia      grandson   History  Substance Use Topics  . Smoking status: Former Smoker -- 0.80 packs/day for 20 years    Types: Cigarettes    Quit date: 08/13/1985  . Smokeless tobacco: Never Used  . Alcohol Use: Yes     Comment: rare   OB History   Grav Para Term  Preterm Abortions TAB SAB Ect Mult Living                 Review of Systems  Gastrointestinal: Positive for nausea and abdominal pain. Negative for vomiting.  All other systems reviewed and are negative.     Allergies  Ciprofloxacin; Peanut butter flavor; and Shellfish allergy  Home Medications   Prior to Admission medications   Medication Sig Start Date End Date Taking? Authorizing Provider  ALPRAZolam (XANAX) 0.25 MG tablet Take 1 tablet (0.25 mg total) by mouth at bedtime as needed for anxiety or sleep. 12/21/13   Leeanne Rio, PA-C  Calcium Carbonate-Vitamin D (CALTRATE 600+D) 600-400 MG-UNIT per tablet Take 1 tablet by mouth 2 (two) times daily.    Historical Provider, MD  LORazepam (ATIVAN) 1 MG tablet Take 1 tablet 30 minutes prior to MRI. Do not drive after taking medication. 02/16/14   Debbrah Alar, NP  ondansetron (ZOFRAN-ODT) 8 MG disintegrating tablet Take 1 tablet (8 mg total) by mouth every 8 (eight) hours as needed for nausea. 12/21/13   Leeanne Rio, PA-C  oxyCODONE-acetaminophen (PERCOCET) 10-325 MG per tablet Take 1 tablet by mouth every 4 (four) hours as needed for pain.  Historical Provider, MD  pantoprazole (PROTONIX) 40 MG tablet Take 1 tablet (40 mg total) by mouth 2 (two) times daily before a meal. 12/23/13 12/23/14  Debbrah Alar, NP  sulfamethoxazole-trimethoprim (BACTRIM DS,SEPTRA DS) 800-160 MG per tablet Take 1 tablet by mouth 2 (two) times daily. 01/21/14   Leeanne Rio, PA-C  traMADol (ULTRAM) 50 MG tablet Take 1 tablet (50 mg total) by mouth every 8 (eight) hours as needed. 01/21/14   Leeanne Rio, PA-C   BP 124/68  Pulse 68  Resp 15  SpO2 99% Physical Exam  Nursing note and vitals reviewed. Constitutional: She is oriented to person, place, and time. She appears well-developed and well-nourished.  HENT:  Head: Normocephalic and atraumatic.  Right Ear: External ear normal.  Left Ear: External ear normal.  Nose: Nose  normal.  Mouth/Throat: Oropharynx is clear and moist.  Eyes: Conjunctivae and EOM are normal. Pupils are equal, round, and reactive to light.  Neck: Normal range of motion.  Cardiovascular: Normal rate.   Pulmonary/Chest: Effort normal and breath sounds normal.  Abdominal: Soft. She exhibits no distension.  Musculoskeletal: Normal range of motion.  Neurological: She is alert and oriented to person, place, and time.  Skin: Skin is warm.  Psychiatric: She has a normal mood and affect.    ED Course  Procedures (including critical care time) Labs Review Labs Reviewed  COMPREHENSIVE METABOLIC PANEL - Abnormal; Notable for the following:    GFR calc non Af Amer 68 (*)    GFR calc Af Amer 78 (*)    All other components within normal limits  URINALYSIS, ROUTINE W REFLEX MICROSCOPIC  CBC WITH DIFFERENTIAL  LIPASE, BLOOD    Imaging Review Ct Abdomen Pelvis W Contrast  02/20/2014   CLINICAL DATA:  Diverticulitis. Evaluate for perforation. periumbilical pain.  EXAM: CT ABDOMEN AND PELVIS WITH CONTRAST  TECHNIQUE: Multidetector CT imaging of the abdomen and pelvis was performed using the standard protocol following bolus administration of intravenous contrast.  CONTRAST:  176mL OMNIPAQUE IOHEXOL 300 MG/ML SOLN, 69mL OMNIPAQUE IOHEXOL 300 MG/ML SOLN  COMPARISON:  CT abdomen pelvis with contrast 12/13/2013  FINDINGS: The imaged lung bases are clear.  5 mm stable cyst right hepatic lobe. The liver is normal in size and enhancement. No suspicious hepatic mass or biliary ductal dilatation. The gallbladder, pancreas, adrenal glands, spleen, and kidneys are within normal limits. Ureters are normal in caliber.  Stomach is decompressed and unremarkable. Small bowel loops are normal in caliber and wall thickness. Normal appearance of the terminal ileum.  The appendix is retrocecal and normal in appearance. Colon is well opacified with oral contrast. There are multiple colonic diverticula, most prominent in the  descending and sigmoid colon. There is also some and fold thickening in the sigmoid colon consistent with diverticulosis. The previously described acute diverticulitis on the CT of 12/13/2013 appears resolved. At this time, no pericolonic fat stranding or fluid is seen. Negative for free pelvic fluid or free intraperitoneal air. Negative for abscess.  Abdominal aorta is normal in caliber. Hysterectomy. No adnexal mass. No acute or suspicious bony abnormality. Degenerative disc disease at L5-S1.  IMPRESSION: 1. Diverticulosis of the colon, most prominent in the sigmoid region. Currently, there are no CT findings to suggest acute diverticulitis. Previously seen acute findings associated with the sigmoid colon on the CT of 12/13/2013 appear resolved. No acute findings identified in the abdomen or pelvis. 2. Negative for abscess, free fluid, free air. 3. Normal appendix.   Electronically Signed  By: Curlene Dolphin M.D.   On: 02/20/2014 18:08     EKG Interpretation None       Pt advised to follow up with her care provider for recheck next week.     Final diagnoses:  Abdominal pain, acute        Fransico Meadow, PA-C 02/20/14 1834

## 2014-02-20 NOTE — ED Notes (Signed)
Patient transported to X-ray 

## 2014-02-20 NOTE — ED Provider Notes (Signed)
Medical screening examination/treatment/procedure(s) were performed by non-physician practitioner and as supervising physician I was immediately available for consultation/collaboration.    Dorie Rank, MD 02/20/14 [phone]

## 2014-02-20 NOTE — ED Notes (Signed)
Pt reports several day hx of periumbilical pain.  Pt has had an [place] and MRI for same with no results.  Pt denies N/V/D.

## 2014-02-23 ENCOUNTER — Ambulatory Visit: Payer: Self-pay | Admitting: Sports Medicine

## 2014-02-25 ENCOUNTER — Encounter: Payer: Self-pay | Admitting: Family

## 2014-03-02 ENCOUNTER — Encounter: Payer: Self-pay | Admitting: Physician Assistant

## 2014-03-02 ENCOUNTER — Ambulatory Visit (INDEPENDENT_AMBULATORY_CARE_PROVIDER_SITE_OTHER): Payer: 59 | Admitting: Physician Assistant

## 2014-03-02 ENCOUNTER — Ambulatory Visit (INDEPENDENT_AMBULATORY_CARE_PROVIDER_SITE_OTHER): Payer: 59

## 2014-03-02 VITALS — BP 111/64 | HR 87 | Ht 64.0 in | Wt 142.0 lb

## 2014-03-02 DIAGNOSIS — R293 Abnormal posture: Secondary | ICD-10-CM

## 2014-03-02 DIAGNOSIS — M545 Low back pain, unspecified: Secondary | ICD-10-CM

## 2014-03-02 DIAGNOSIS — M6281 Muscle weakness (generalized): Secondary | ICD-10-CM

## 2014-03-02 DIAGNOSIS — F419 Anxiety disorder, unspecified: Secondary | ICD-10-CM

## 2014-03-02 DIAGNOSIS — M25659 Stiffness of unspecified hip, not elsewhere classified: Secondary | ICD-10-CM

## 2014-03-02 DIAGNOSIS — K5900 Constipation, unspecified: Secondary | ICD-10-CM

## 2014-03-02 DIAGNOSIS — F32A Depression, unspecified: Secondary | ICD-10-CM

## 2014-03-02 DIAGNOSIS — R109 Unspecified abdominal pain: Secondary | ICD-10-CM

## 2014-03-02 DIAGNOSIS — F341 Dysthymic disorder: Secondary | ICD-10-CM

## 2014-03-02 DIAGNOSIS — F329 Major depressive disorder, single episode, unspecified: Secondary | ICD-10-CM

## 2014-03-02 LAB — POCT URINALYSIS DIPSTICK
BILIRUBIN UA: NEGATIVE
GLUCOSE UA: NEGATIVE
Ketones, UA: NEGATIVE
Leukocytes, UA: NEGATIVE
Nitrite, UA: NEGATIVE
Protein, UA: NEGATIVE
RBC UA: NEGATIVE
SPEC GRAV UA: 1.015
Urobilinogen, UA: 0.2
pH, UA: 7

## 2014-03-02 MED ORDER — LINACLOTIDE 290 MCG PO CAPS: 290.0000 ug | ORAL_CAPSULE | Freq: Every day | ORAL | Status: DC

## 2014-03-02 MED ORDER — ALPRAZOLAM 0.25 MG PO TABS: 0.2500 mg | ORAL_TABLET | Freq: Every evening | ORAL | Status: DC | PRN

## 2014-03-02 MED ORDER — HYDROCODONE-ACETAMINOPHEN 5-325 MG PO TABS: 2.0000 | ORAL_TABLET | ORAL | Status: DC | PRN

## 2014-03-02 NOTE — Patient Instructions (Signed)
Will get referral for GI.  Norco as needed for pain.  Xanax as needed for anxiety. LInzess daily for constipation.

## 2014-03-06 ENCOUNTER — Other Ambulatory Visit: Payer: Self-pay | Admitting: Physician Assistant

## 2014-03-06 LAB — URINE CULTURE: Colony Count: >=100000

## 2014-03-06 MED ORDER — NITROFURANTOIN MONOHYD MACRO 100 MG PO CAPS: 100.0000 mg | ORAL_CAPSULE | Freq: Two times a day (BID) | ORAL | Status: DC

## 2014-03-07 NOTE — Progress Notes (Signed)
Subjective:    Patient ID: Jamie [name], female    DOB: 11-Oct-1950, 63 y.o.   MRN: [id number]  HPI Pt is a 63 yo female who presents to the clinic to establish care. [facility] has [facility] Raiford Noble PAC at [facility] for abdominal pain. She continues to have abdominal pain and wants to be evaluated today. She was dxed with diverticulilitis for the first time at [facility] in may 2015. Treated and felt better for a few weeks. Abdominal pain has started since and she has been under care of PA-C above. CT/ultrasound and bloodwork have all been negative for any cause of pain. She continues to have sproadic pain 6/10 at times that feels like her abdomen is being pulled apart. Mainly on right lower abdomen. Worse with bowel movements and urination. Does seem to be going to the bathroom more often. Admits that bowel movements are hard and not very often. Last BM was 3 days ago. Denies any fever, chills, n/v/d.   .. Active Ambulatory Problems    Diagnosis Date Noted  . ONYCHOMYCOSIS, TOENAILS 06/09/2010  . ANXIETY DEPRESSION 06/09/2010  . Hyperlipidemia 12/13/2010  . Diverticulosis of colon (without mention of hemorrhage) 07/03/2011  . Seasonal and perennial allergic rhinitis 11/06/2011  . Breast cyst 06/16/2012  . Routine general medical examination at a health care facility 07/18/2012  . Diverticulitis of colon (without mention of hemorrhage) 12/21/2013  . Anxiety and depression 12/21/2013  . Preventive measure 12/21/2013  . Loose stools 01/01/2014  . Osteopenia 01/20/2014  . Abdominal pain, unspecified site 01/24/2014   Resolved Ambulatory Problems    Diagnosis Date Noted  . Dermatophytosis of the body 06/09/2010  . CHEST PAIN-PRECORDIAL 07/18/2010  . CONJUNCTIVITIS, LEFT 09/08/2010  . Tinea versicolor 12/12/2010  . Sinusitis 12/12/2010  . Abdominal  pain, other specified site 06/10/2011  . Extrinsic asthma, unspecified 08/16/2011  . Strep throat  09/07/2011  . Chest pain 10/01/2011  . Pharyngitis 11/06/2011  . Rash 12/24/2011  . Nausea 01/29/2012  . UTI (lower urinary tract infection) 01/29/2012  . Sinusitis 01/29/2012  . Diverticulitis 04/25/2012  . Left leg weakness 06/16/2012  . SOB (shortness of breath) 07/15/2012  . Bronchitis 07/18/2012  . Chest pain 08/20/2012  . B12 deficiency 02/04/2013  . Acute sinusitis with symptoms > 10 days 04/30/2013   Past Medical History  Diagnosis Date  . Anxiety   . Diverticulosis   . IBS (irritable bowel syndrome)   . Depression    .Marland Kitchen Family History  Problem Relation Age of Onset  . Cancer Mother     renal cell carcinoma  . Diabetes Mother   . Heart disease Father     first MI in 51s  . Cancer Sister     cervical cancer  . Heart disease Brother     first MI in 67s  . Alcohol abuse Sister   . Fibromyalgia Sister   . Arthritis Sister     osteoarthritis  . Irritable bowel syndrome Sister   . Leukemia      grandson   .Marland Kitchen History   Social History  . Marital Status: Divorced    Spouse Name: N/A    Number of Children: 3  . Years of Education: N/A   Occupational History  . Student    Social History Main Topics  . Smoking status: Former Smoker -- 0.80 packs/day for 20 years    Types: Cigarettes    Quit date: 08/13/1985  .  Smokeless tobacco: Never Used  . Alcohol Use: Yes     Comment: rare  . Drug Use: No  . Sexual Activity: Not on file   Other Topics Concern  . Not on file   Social History Narrative   Lives with and grandchild (she is foster parent for grand child)Completed school full time.   Caffeine use; occasional   Works in Personal assistant, Economist.   In process of divorce.             Review of Systems  All other systems reviewed and are negative.      Objective:   Physical Exam  Constitutional: She is oriented to person, place, and time. She appears well-developed and well-nourished.  HENT:  Head: Normocephalic and  atraumatic.  Cardiovascular: Normal rate, regular rhythm and normal heart sounds.   Pulmonary/Chest: Effort normal and breath sounds normal. She has no wheezes.  No CVA tenderness.   Abdominal: Soft.  Hyperactive bowel sounds.  Tenderness to palpation over bilateral lower quadrant.   Neurological: She is alert and oriented to person, place, and time.  Psychiatric: She has a normal mood and affect.          Assessment & Plan:  Abdominal pain, generalized- .Marland Kitchen Results for orders placed in visit on 03/02/14  URINE CULTURE      Result Value Ref Range   Culture       Value: KLEBSIELLA PNEUMONIAE     ESCHERICHIA COLI   Colony Count >=100,000 COLONIES/ML     Organism ID, Bacteria KLEBSIELLA PNEUMONIAE     Organism ID, Bacteria ESCHERICHIA COLI    POCT URINALYSIS DIPSTICK      Result Value Ref Range   Color, UA yellow     Clarity, UA clear     Glucose, UA neg     Bilirubin, UA neg     Ketones, UA neg     Spec Grav, UA 1.015     Blood, UA neg     pH, UA 7.0     Protein, UA neg     Urobilinogen, UA 0.2     Nitrite, UA neg     Leukocytes, UA Negative     Urine dipstick clean today.  Culture received and sent macrobid for 7 days. Checked creatine clearance and above 60. Unclear etiology for abdominal pain. CT done last week was normal for any acute process including diverticulitis. Explained to pt did not want to treat for diverticulitisi even though that is her concern with imaging that confirms it is not. We treat for constipation, UTI confirmed after culture and treated, and get GI appt. If symptoms are improving then can cancel appt.    Constipation- certainly constipation could be causing some of this pain. Tried OTC miralax and other laxitives. Gave samples of linzess 190mcg daily. Gave rx for 211mcg since she has pain. SE discussed. Call if constipation is not improved in the next 7-14 days.

## 2014-03-08 ENCOUNTER — Encounter: Payer: Self-pay | Admitting: Physical Therapy

## 2014-03-10 ENCOUNTER — Encounter (INDEPENDENT_AMBULATORY_CARE_PROVIDER_SITE_OTHER): Payer: 59

## 2014-03-10 DIAGNOSIS — R293 Abnormal posture: Secondary | ICD-10-CM

## 2014-03-10 DIAGNOSIS — IMO0002 Reserved for concepts with insufficient information to code with codable children: Secondary | ICD-10-CM

## 2014-03-10 DIAGNOSIS — M2560 Stiffness of unspecified joint, not elsewhere classified: Secondary | ICD-10-CM

## 2014-03-10 DIAGNOSIS — M6281 Muscle weakness (generalized): Secondary | ICD-10-CM

## 2014-03-15 ENCOUNTER — Encounter (INDEPENDENT_AMBULATORY_CARE_PROVIDER_SITE_OTHER): Payer: 59

## 2014-03-15 DIAGNOSIS — M6281 Muscle weakness (generalized): Secondary | ICD-10-CM

## 2014-03-15 DIAGNOSIS — M2560 Stiffness of unspecified joint, not elsewhere classified: Secondary | ICD-10-CM

## 2014-03-15 DIAGNOSIS — M545 Low back pain, unspecified: Secondary | ICD-10-CM

## 2014-03-15 DIAGNOSIS — R293 Abnormal posture: Secondary | ICD-10-CM

## 2014-03-17 ENCOUNTER — Encounter (INDEPENDENT_AMBULATORY_CARE_PROVIDER_SITE_OTHER): Payer: 59

## 2014-03-17 ENCOUNTER — Telehealth: Payer: Self-pay | Admitting: Gastroenterology

## 2014-03-17 DIAGNOSIS — M545 Low back pain, unspecified: Secondary | ICD-10-CM

## 2014-03-17 DIAGNOSIS — M6281 Muscle weakness (generalized): Secondary | ICD-10-CM

## 2014-03-17 DIAGNOSIS — M2560 Stiffness of unspecified joint, not elsewhere classified: Secondary | ICD-10-CM

## 2014-03-17 DIAGNOSIS — R293 Abnormal posture: Secondary | ICD-10-CM

## 2014-03-17 NOTE — Telephone Encounter (Signed)
Left message on machine to call back  

## 2014-03-17 NOTE — Telephone Encounter (Signed)
Pt has a sensation of stretching or ripping in her abdomen.  She has had numerous imaging including CT and [place] on 02/20/14, 01/29/14, 01/21/14, 12/13/13, 04/25/12, 01/28/12, 07/04/11, 06/07/11 pt states she can not wait for appt with Dr Ardis Hughs so she is scheduled with Amy for 03/23/14 she has a history of diverticulosis and last seen by Dr Ardis Hughs 06/2011

## 2014-03-22 ENCOUNTER — Encounter (INDEPENDENT_AMBULATORY_CARE_PROVIDER_SITE_OTHER): Payer: 59 | Admitting: Physical Therapy

## 2014-03-22 DIAGNOSIS — M545 Low back pain, unspecified: Secondary | ICD-10-CM

## 2014-03-22 DIAGNOSIS — M6281 Muscle weakness (generalized): Secondary | ICD-10-CM

## 2014-03-22 DIAGNOSIS — M2560 Stiffness of unspecified joint, not elsewhere classified: Secondary | ICD-10-CM

## 2014-03-22 DIAGNOSIS — R293 Abnormal posture: Secondary | ICD-10-CM

## 2014-03-23 ENCOUNTER — Encounter: Payer: Self-pay | Admitting: Physician Assistant

## 2014-03-23 ENCOUNTER — Ambulatory Visit (INDEPENDENT_AMBULATORY_CARE_PROVIDER_SITE_OTHER): Payer: 59 | Admitting: Physician Assistant

## 2014-03-23 ENCOUNTER — Other Ambulatory Visit (INDEPENDENT_AMBULATORY_CARE_PROVIDER_SITE_OTHER): Payer: 59

## 2014-03-23 ENCOUNTER — Telehealth: Payer: Self-pay | Admitting: *Deleted

## 2014-03-23 VITALS — BP 108/66 | HR 70 | Ht 64.5 in | Wt 141.0 lb

## 2014-03-23 DIAGNOSIS — K5900 Constipation, unspecified: Secondary | ICD-10-CM

## 2014-03-23 DIAGNOSIS — K5909 Other constipation: Secondary | ICD-10-CM

## 2014-03-23 DIAGNOSIS — R1032 Left lower quadrant pain: Secondary | ICD-10-CM

## 2014-03-23 DIAGNOSIS — R1012 Left upper quadrant pain: Secondary | ICD-10-CM

## 2014-03-23 DIAGNOSIS — N3090 Cystitis, unspecified without hematuria: Secondary | ICD-10-CM

## 2014-03-23 LAB — URINALYSIS, ROUTINE W REFLEX MICROSCOPIC
Bilirubin Urine: NEGATIVE
Hgb urine dipstick: NEGATIVE
Ketones, ur: NEGATIVE
Leukocytes, UA: NEGATIVE
NITRITE: NEGATIVE
SPECIFIC GRAVITY, URINE: 1.015 (ref 1.000–1.030)
Total Protein, Urine: NEGATIVE
URINE GLUCOSE: NEGATIVE
Urobilinogen, UA: 0.2 (ref 0.0–1.0)
pH: 7 (ref 5.0–8.0)

## 2014-03-23 MED ORDER — MOVIPREP 100 G PO SOLR: 1.0000 | ORAL | Status: DC

## 2014-03-23 NOTE — Patient Instructions (Addendum)
Please go to the basement level to have your labs drawn.  You have been scheduled for a colonoscopy. Please follow written instructions given to you at your visit today.  Please pick up your prep kit at the pharmacy within the next 1-3 days. If you use inhalers (even only as needed), please bring them with you on the day of your procedure. Your physician has requested that you go to www.startemmi.com and enter the access code given to you at your visit today. This web site gives a general overview about your procedure. However, you should still follow specific instructions given to you by our office regarding your preparation for the procedure.  You will be getting a call from [facility] with an appointment. You will be seeing Dr. Louis Meckel, [facility], 04-13-2014 at 9:45 am. Arrive 9:30 AM. Take the Linzess 290 mcg, 1 tab every other day.

## 2014-03-23 NOTE — Telephone Encounter (Signed)
Spoke to [facility] , made referral for pt to see Dr. Louis Meckel, 04-13-2014 at 9:45 am.  Patient is to call her insurance company to ask if she needs a referral for this appointment.

## 2014-03-23 NOTE — Progress Notes (Signed)
i agree with the above note, plan 

## 2014-03-23 NOTE — Progress Notes (Signed)
Subjective:    Patient ID: Jamie [name], female    DOB: 1951/06/07, 63 y.o.   MRN: [id number]  HPI  Jamie [name] is a pleasant 63 year old white female known remotely to Dr. Ardis Hughs from colonoscopy done in 2009. She was noted to have moderate sigmoid diverticulosis and internal and external hemorrhoids. She comes in today with new complaint of lower abdominal pain which she describes as a stretching or pulling type pain in her lower abdomen which has been present intermittently over the past 3-4 months. Pain has not been progressive but she is concerned because it has been persistent. She notes some increase in symptoms with lifting bending etc. she also states that her pain is he is somewhat after bowel movements and with emptying her bladder and is worse when her bladder is full. She was diagnosed by CT scan with an episode of diverticulitis in May of 2015 which was treated. She had repeat CT scan of the abdomen and pelvis done in July of 2015 for the pain that she comes in with today. There was no evidence of diverticulitis she did have diverticulosis there was one segment of fold thickening in the sigmoid. Otherwise negative study. CBC cemented lipase were all normal in July. She was diagnosed with a Klebsiella UTI on 03/02/2014 was treated by her PCP. She says she did not have any symptoms of bladder infection at that time. Her appetite has been fine ,her weight has been stable over the past 9 months ago- prior to that time she had lost about 30 pounds. She says this was stress-induced due to problems with her husband. She does have chronic problems with constipation. She was recently given a prescription for Linzess by her PCP 290 mcg. She has been using this as needed every 2-3 days and says it works well. She is worried about cancer and would like to have another colonoscopy.    Review of Systems  Constitutional: Negative.   HENT: Negative.   Eyes: Negative.   Respiratory: Negative.     Gastrointestinal: Positive for abdominal pain and constipation.  Endocrine: Negative.   Genitourinary: Positive for pelvic pain.  Skin: Negative.   Allergic/Immunologic: Negative.   Hematological: Negative.   Psychiatric/Behavioral: Negative.    Outpatient Prescriptions Prior to Visit  Medication Sig Dispense Refill  . ALPRAZolam (XANAX) 0.25 MG tablet Take 1 tablet (0.25 mg total) by mouth at bedtime as needed for anxiety or sleep.  30 tablet  0  . Linaclotide (LINZESS) 290 MCG CAPS capsule Take 1 capsule (290 mcg total) by mouth daily.  30 capsule  1  . ALPRAZolam (XANAX) 0.25 MG tablet Take 0.5 mg by mouth daily.      Marland Kitchen HYDROcodone-acetaminophen (NORCO/VICODIN) 5-325 MG per tablet Take 2 tablets by mouth every 4 (four) hours as needed.  20 tablet  0  . nitrofurantoin, macrocrystal-monohydrate, (MACROBID) 100 MG capsule Take 1 capsule (100 mg total) by mouth 2 (two) times daily. For 7 days.  14 capsule  0   No facility-administered medications prior to visit.     Allergies  Allergen Reactions  . Ciprofloxacin     Facial numbness  . Peanut Butter Flavor     N/V  . Shellfish Allergy Swelling   Patient Active Problem List   Diagnosis Date Noted  . Abdominal pain, unspecified site 01/24/2014  . Osteopenia 01/20/2014  . Loose stools 01/01/2014  . Diverticulitis of colon (without mention of hemorrhage) 12/21/2013  . Anxiety and depression 12/21/2013  . Preventive  measure 12/21/2013  . Routine general medical examination at a health care facility 07/18/2012  . Breast cyst 06/16/2012  . Seasonal and perennial allergic rhinitis 11/06/2011  . Diverticulosis of colon (without mention of hemorrhage) 07/03/2011  . Hyperlipidemia 12/13/2010  . ONYCHOMYCOSIS, TOENAILS 06/09/2010  . ANXIETY DEPRESSION 06/09/2010   History  Substance Use Topics  . Smoking status: Former Smoker -- 0.80 packs/day for 20 years    Types: Cigarettes    Quit date: 08/13/1985  . Smokeless tobacco: Never  Used  . Alcohol Use: Yes     Comment: rare   family history includes Alcohol abuse in her sister; Arthritis in her sister; Cancer in her mother and sister; Diabetes in her mother; Fibromyalgia in her sister; Heart disease in her brother and father; Irritable bowel syndrome in her sister; Leukemia in an other family member.     Objective:   Physical Exam well-developed white female in no acute distress pleasant blood pressure 108/68 pulse 70 height 5 foot 4 weight 141. HEENT; nontraumatic normocephalic EOMI PERRLA sclera anicteric, Supple ;no JVD, Cardiovascular; regular rate and rhythm with S1-S2 no murmur or gallop, Pulm; clear bilaterally, Abdomen ;soft nondistended, bowel sounds are present she has mild tenderness in the suprapubic area and left lower quadrant there is no guarding or rebound no palpable mass or hepatosplenomegaly bowel sounds are present, Rectal; exam not done, Extremities ;no clubbing cyanosis or edema skin warm dry, Psych ;mood and affect appropriate        Assessment & Plan:  #61  63 year old female with known diverticular disease and history of previous diverticulitis now with 4 month history of intermittent suprapubic and left lower quadrant pain described as a stretching or pulling. Patient was just treated for a UTI for which she was asymptomatic. Etiology of her pain is not clear, she may have adhesions , question chronic cystitis as symptoms definitely worse with a full bladder, rule out occult lesion #2 chronic constipation #3 hyperlipidemia #4 osteoporosis #5 chronic anxiety/depression  Plan; continue Linzess 290 mcg- have asked her to take this medication regularly every other day and to use on a daily basis for week prior to her colonoscopy Schedule for colonoscopy with Dr. Ardis Hughs. Procedure discussed in detail with the patient and she is agreeable to proceed. Will also repeat UA and culture today and obtain a consultation with [facility].

## 2014-03-24 LAB — URINE CULTURE
Colony Count: NO GROWTH
Organism ID, Bacteria: NO GROWTH

## 2014-03-25 ENCOUNTER — Encounter (INDEPENDENT_AMBULATORY_CARE_PROVIDER_SITE_OTHER): Payer: 59 | Admitting: Physical Therapy

## 2014-03-25 ENCOUNTER — Telehealth: Payer: Self-pay | Admitting: Physician Assistant

## 2014-03-25 DIAGNOSIS — R293 Abnormal posture: Secondary | ICD-10-CM

## 2014-03-25 DIAGNOSIS — M6281 Muscle weakness (generalized): Secondary | ICD-10-CM

## 2014-03-25 DIAGNOSIS — M2560 Stiffness of unspecified joint, not elsewhere classified: Secondary | ICD-10-CM

## 2014-03-25 DIAGNOSIS — M545 Low back pain, unspecified: Secondary | ICD-10-CM

## 2014-03-25 NOTE — Telephone Encounter (Signed)
Patient notified of results.

## 2014-03-29 ENCOUNTER — Encounter (INDEPENDENT_AMBULATORY_CARE_PROVIDER_SITE_OTHER): Payer: 59 | Admitting: Physical Therapy

## 2014-03-29 DIAGNOSIS — M6281 Muscle weakness (generalized): Secondary | ICD-10-CM

## 2014-03-29 DIAGNOSIS — M545 Low back pain, unspecified: Secondary | ICD-10-CM

## 2014-03-29 DIAGNOSIS — R293 Abnormal posture: Secondary | ICD-10-CM

## 2014-03-29 DIAGNOSIS — M2460 Ankylosis, unspecified joint: Secondary | ICD-10-CM

## 2014-03-31 ENCOUNTER — Encounter (INDEPENDENT_AMBULATORY_CARE_PROVIDER_SITE_OTHER): Payer: 59 | Admitting: Physical Therapy

## 2014-03-31 DIAGNOSIS — M545 Low back pain, unspecified: Secondary | ICD-10-CM

## 2014-03-31 DIAGNOSIS — R293 Abnormal posture: Secondary | ICD-10-CM

## 2014-03-31 DIAGNOSIS — M2560 Stiffness of unspecified joint, not elsewhere classified: Secondary | ICD-10-CM

## 2014-03-31 DIAGNOSIS — M6281 Muscle weakness (generalized): Secondary | ICD-10-CM

## 2014-04-02 ENCOUNTER — Ambulatory Visit: Payer: Self-pay | Admitting: Family

## 2014-04-05 ENCOUNTER — Ambulatory Visit: Payer: Self-pay | Admitting: Family

## 2014-04-05 ENCOUNTER — Encounter (INDEPENDENT_AMBULATORY_CARE_PROVIDER_SITE_OTHER): Payer: 59 | Admitting: Physical Therapy

## 2014-04-05 DIAGNOSIS — R293 Abnormal posture: Secondary | ICD-10-CM

## 2014-04-05 DIAGNOSIS — M545 Low back pain, unspecified: Secondary | ICD-10-CM

## 2014-04-05 DIAGNOSIS — M6281 Muscle weakness (generalized): Secondary | ICD-10-CM

## 2014-04-05 DIAGNOSIS — Z0289 Encounter for other administrative examinations: Secondary | ICD-10-CM

## 2014-04-05 DIAGNOSIS — M2560 Stiffness of unspecified joint, not elsewhere classified: Secondary | ICD-10-CM

## 2014-04-07 ENCOUNTER — Encounter (INDEPENDENT_AMBULATORY_CARE_PROVIDER_SITE_OTHER): Payer: 59 | Admitting: Physical Therapy

## 2014-04-07 DIAGNOSIS — R293 Abnormal posture: Secondary | ICD-10-CM

## 2014-04-07 DIAGNOSIS — M6281 Muscle weakness (generalized): Secondary | ICD-10-CM

## 2014-04-07 DIAGNOSIS — M545 Low back pain, unspecified: Secondary | ICD-10-CM

## 2014-04-07 DIAGNOSIS — M2560 Stiffness of unspecified joint, not elsewhere classified: Secondary | ICD-10-CM

## 2014-04-12 ENCOUNTER — Encounter (INDEPENDENT_AMBULATORY_CARE_PROVIDER_SITE_OTHER): Payer: 59 | Admitting: Physical Therapy

## 2014-04-12 DIAGNOSIS — M545 Low back pain, unspecified: Secondary | ICD-10-CM

## 2014-04-12 DIAGNOSIS — M6281 Muscle weakness (generalized): Secondary | ICD-10-CM

## 2014-04-12 DIAGNOSIS — M2560 Stiffness of unspecified joint, not elsewhere classified: Secondary | ICD-10-CM

## 2014-04-12 DIAGNOSIS — R293 Abnormal posture: Secondary | ICD-10-CM

## 2014-04-13 ENCOUNTER — Telehealth: Payer: Self-pay | Admitting: Physician Assistant

## 2014-04-13 NOTE — Telephone Encounter (Signed)
Ms. Soliday called. She would like referral to an Allergy specialist and she would also like to get bloodwork.

## 2014-04-14 ENCOUNTER — Encounter: Payer: Self-pay | Admitting: Gastroenterology

## 2014-04-14 ENCOUNTER — Encounter (INDEPENDENT_AMBULATORY_CARE_PROVIDER_SITE_OTHER): Payer: 59 | Admitting: Physical Therapy

## 2014-04-14 ENCOUNTER — Other Ambulatory Visit: Payer: Self-pay | Admitting: Family

## 2014-04-14 DIAGNOSIS — M545 Low back pain, unspecified: Secondary | ICD-10-CM

## 2014-04-14 DIAGNOSIS — M2560 Stiffness of unspecified joint, not elsewhere classified: Secondary | ICD-10-CM

## 2014-04-14 DIAGNOSIS — R269 Unspecified abnormalities of gait and mobility: Secondary | ICD-10-CM

## 2014-04-14 DIAGNOSIS — M6281 Muscle weakness (generalized): Secondary | ICD-10-CM

## 2014-04-14 NOTE — Telephone Encounter (Signed)
Call pt: what symptoms does she want allergy referral for. Sometimes treatment at primary care level can avoid specialist visit.

## 2014-04-14 NOTE — Telephone Encounter (Signed)
Left message for patient to return call.

## 2014-04-20 ENCOUNTER — Encounter (INDEPENDENT_AMBULATORY_CARE_PROVIDER_SITE_OTHER): Payer: 59 | Admitting: Physical Therapy

## 2014-04-20 ENCOUNTER — Telehealth: Payer: Self-pay | Admitting: Gastroenterology

## 2014-04-20 DIAGNOSIS — R293 Abnormal posture: Secondary | ICD-10-CM

## 2014-04-20 DIAGNOSIS — M2560 Stiffness of unspecified joint, not elsewhere classified: Secondary | ICD-10-CM

## 2014-04-20 DIAGNOSIS — M545 Low back pain, unspecified: Secondary | ICD-10-CM

## 2014-04-20 DIAGNOSIS — M6281 Muscle weakness (generalized): Secondary | ICD-10-CM

## 2014-04-20 NOTE — Telephone Encounter (Signed)
Third phone call to patient. No answer. Left clear and specific message for Jamie [name] to follow prep instructions as given. No further food, start prep as scheduled this evening, and continue as scheduled tomorrow, no further food. Present 04/21/14 at 130 pm for 230 pm colonoscopy.

## 2014-04-20 NOTE — Telephone Encounter (Signed)
Returned call to patient, no answer, message left to call Olivia Mackie back at [phone].

## 2014-04-20 NOTE — Telephone Encounter (Signed)
Second attempt to reach patient. Call unanswered.

## 2014-04-21 ENCOUNTER — Encounter: Payer: 59 | Admitting: Gastroenterology

## 2014-04-22 NOTE — Telephone Encounter (Signed)
Left message for patient to return call.

## 2014-04-26 ENCOUNTER — Encounter: Payer: 59 | Admitting: Physical Therapy

## 2014-04-28 ENCOUNTER — Encounter: Payer: 59 | Admitting: Physical Therapy

## 2014-05-03 ENCOUNTER — Encounter: Payer: 59 | Admitting: Physical Therapy

## 2014-05-05 ENCOUNTER — Encounter: Payer: 59 | Admitting: Physical Therapy

## 2014-05-13 ENCOUNTER — Other Ambulatory Visit: Payer: Self-pay | Admitting: Physician Assistant

## 2014-05-14 ENCOUNTER — Ambulatory Visit: Payer: 59 | Admitting: Physician Assistant

## 2014-05-14 ENCOUNTER — Telehealth: Payer: Self-pay | Admitting: Physician Assistant

## 2014-05-14 NOTE — Telephone Encounter (Signed)
Call pt: let her know she missed appt. Feel free to reshedule or for acute needs follow up in Urgent Care.

## 2014-10-20 ENCOUNTER — Other Ambulatory Visit: Payer: Self-pay | Admitting: Physician Assistant

## 2014-12-30 ENCOUNTER — Emergency Department (HOSPITAL_BASED_OUTPATIENT_CLINIC_OR_DEPARTMENT_OTHER): Payer: No Typology Code available for payment source

## 2014-12-30 ENCOUNTER — Encounter (HOSPITAL_BASED_OUTPATIENT_CLINIC_OR_DEPARTMENT_OTHER): Payer: Self-pay | Admitting: *Deleted

## 2014-12-30 ENCOUNTER — Emergency Department (HOSPITAL_BASED_OUTPATIENT_CLINIC_OR_DEPARTMENT_OTHER)
Admission: EM | Admit: 2014-12-30 | Discharge: 2014-12-30 | Disposition: A | Discharge status: Home / Self Care | Payer: No Typology Code available for payment source | Attending: Emergency Medicine | Admitting: Emergency Medicine

## 2014-12-30 DIAGNOSIS — R0602 Shortness of breath: Secondary | ICD-10-CM | POA: Insufficient documentation

## 2014-12-30 DIAGNOSIS — R51 Headache: Secondary | ICD-10-CM | POA: Insufficient documentation

## 2014-12-30 DIAGNOSIS — Z8639 Personal history of other endocrine, nutritional and metabolic disease: Secondary | ICD-10-CM | POA: Insufficient documentation

## 2014-12-30 DIAGNOSIS — F419 Anxiety disorder, unspecified: Secondary | ICD-10-CM | POA: Insufficient documentation

## 2014-12-30 DIAGNOSIS — M542 Cervicalgia: Secondary | ICD-10-CM | POA: Insufficient documentation

## 2014-12-30 DIAGNOSIS — Z87891 Personal history of nicotine dependence: Secondary | ICD-10-CM | POA: Insufficient documentation

## 2014-12-30 DIAGNOSIS — Z79899 Other long term (current) drug therapy: Secondary | ICD-10-CM | POA: Insufficient documentation

## 2014-12-30 DIAGNOSIS — R519 Headache, unspecified: Secondary | ICD-10-CM

## 2014-12-30 DIAGNOSIS — R531 Weakness: Secondary | ICD-10-CM | POA: Insufficient documentation

## 2014-12-30 DIAGNOSIS — R079 Chest pain, unspecified: Secondary | ICD-10-CM | POA: Insufficient documentation

## 2014-12-30 DIAGNOSIS — Z8719 Personal history of other diseases of the digestive system: Secondary | ICD-10-CM | POA: Insufficient documentation

## 2014-12-30 DIAGNOSIS — F329 Major depressive disorder, single episode, unspecified: Secondary | ICD-10-CM | POA: Insufficient documentation

## 2014-12-30 DIAGNOSIS — R110 Nausea: Secondary | ICD-10-CM | POA: Insufficient documentation

## 2014-12-30 LAB — LIPASE, BLOOD: LIPASE: 34 U/L (ref 22–51)

## 2014-12-30 LAB — CBC WITH DIFFERENTIAL/PLATELET
BASOS ABS: 0 10*3/uL (ref 0.0–0.1)
BASOS PCT: 1 % (ref 0–1)
EOS PCT: 1 % (ref 0–5)
Eosinophils Absolute: 0.1 10*3/uL (ref 0.0–0.7)
HEMATOCRIT: 37.4 % (ref 36.0–46.0)
Hemoglobin: 13 g/dL (ref 12.0–15.0)
Lymphocytes Relative: 29 % (ref 12–46)
Lymphs Abs: 1.6 10*3/uL (ref 0.7–4.0)
MCH: 31 pg (ref 26.0–34.0)
MCHC: 34.8 g/dL (ref 30.0–36.0)
MCV: 89 fL (ref 78.0–100.0)
Monocytes Absolute: 0.6 10*3/uL (ref 0.1–1.0)
Monocytes Relative: 11 % (ref 3–12)
NEUTROS ABS: 3.2 10*3/uL (ref 1.7–7.7)
Neutrophils Relative %: 58 % (ref 43–77)
Platelets: 328 10*3/uL (ref 150–400)
RBC: 4.2 MIL/uL (ref 3.87–5.11)
RDW: 12 % (ref 11.5–15.5)
WBC: 5.5 10*3/uL (ref 4.0–10.5)

## 2014-12-30 LAB — COMPREHENSIVE METABOLIC PANEL
ALK PHOS: 57 U/L (ref 38–126)
ALT: 18 U/L (ref 14–54)
ANION GAP: 8 (ref 5–15)
AST: 23 U/L (ref 15–41)
Albumin: 4.5 g/dL (ref 3.5–5.0)
BUN: 13 mg/dL (ref 6–20)
CO2: 30 mmol/L (ref 22–32)
CREATININE: 0.8 mg/dL (ref 0.44–1.00)
Calcium: 10 mg/dL (ref 8.9–10.3)
Chloride: 102 mmol/L (ref 101–111)
Glucose, Bld: 105 mg/dL — ABNORMAL HIGH (ref 65–99)
Potassium: 4.2 mmol/L (ref 3.5–5.1)
SODIUM: 140 mmol/L (ref 135–145)
Total Bilirubin: 0.3 mg/dL (ref 0.3–1.2)
Total Protein: 7.5 g/dL (ref 6.5–8.1)

## 2014-12-30 LAB — URINALYSIS, ROUTINE W REFLEX MICROSCOPIC
BILIRUBIN URINE: NEGATIVE
Glucose, UA: NEGATIVE mg/dL
HGB URINE DIPSTICK: NEGATIVE
Ketones, ur: NEGATIVE mg/dL
Leukocytes, UA: NEGATIVE
Nitrite: NEGATIVE
PH: 6 (ref 5.0–8.0)
Protein, ur: NEGATIVE mg/dL
SPECIFIC GRAVITY, URINE: 1.017 (ref 1.005–1.030)
Urobilinogen, UA: 0.2 mg/dL (ref 0.0–1.0)

## 2014-12-30 LAB — TROPONIN I

## 2014-12-30 MED ORDER — SODIUM CHLORIDE 0.9 % IV BOLUS (SEPSIS)
500.0000 mL | Freq: Once | INTRAVENOUS | Status: AC
  Administered 2014-12-30: 500 mL via INTRAVENOUS

## 2014-12-30 MED ORDER — SODIUM CHLORIDE 0.9 % IV SOLN
INTRAVENOUS | Status: DC
  Administered 2014-12-30: 19:00:00 via INTRAVENOUS

## 2014-12-30 MED ORDER — IOHEXOL 350 MG/ML SOLN
100.0000 mL | Freq: Once | INTRAVENOUS | Status: AC | PRN
  Administered 2014-12-30: 100 mL via INTRAVENOUS

## 2014-12-30 MED ORDER — TRAMADOL HCL 50 MG PO TABS: 50.0000 mg | ORAL_TABLET | Freq: Four times a day (QID) | ORAL | Status: DC | PRN

## 2014-12-30 MED ORDER — ONDANSETRON HCL 4 MG/2ML IJ SOLN
4.0000 mg | Freq: Once | INTRAMUSCULAR | Status: AC
  Administered 2014-12-30: 4 mg via INTRAVENOUS
  Filled 2014-12-30: qty 2

## 2014-12-30 MED ORDER — ASPIRIN 81 MG PO CHEW
324.0000 mg | CHEWABLE_TABLET | Freq: Once | ORAL | Status: AC
  Administered 2014-12-30: 324 mg via ORAL
  Filled 2014-12-30: qty 4

## 2014-12-30 NOTE — Discharge Instructions (Signed)
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 This will be very close and prompt follow-up with your primary care doctor. Rest of today's workup without significant findings.

## 2014-12-30 NOTE — ED Notes (Signed)
Urine collected and sent to lab to hold

## 2014-12-30 NOTE — ED Provider Notes (Addendum)
CSN: [id number]     Arrival date & time 12/30/14  1656 History   First MD Initiated Contact with Patient 12/30/14 1701     Chief Complaint  Patient presents with  . Chest Pain     (Consider location/radiation/quality/duration/timing/severity/associated sxs/prior Treatment) The history is provided by the patient.   patient the presenting with several complaints. To include headache neck pain. Chest pain was left-sided been present for 2 days associated with nausea and a general weakness feeling all over. The head and neck pains been present for a week. Chest pain is been constant for the past 2 days. Not made worse or better by anything. Rated a 4 out of 10.  Past Medical History  Diagnosis Date  . Anxiety   . Hyperlipidemia   . Diverticulosis   . IBS (irritable bowel syndrome)   . Depression   . Diverticulitis    Past Surgical History  Procedure Laterality Date  . Abdominal hysterectomy    . Tubal ligation    . Hemorrhoid surgery    . Tumor removal      Left thigh    Family History  Problem Relation Age of Onset  . Cancer Mother     renal cell carcinoma  . Diabetes Mother   . Heart disease Father     first MI in 28s  . Cancer Sister     cervical cancer  . Heart disease Brother     first MI in 31s  . Alcohol abuse Sister   . Fibromyalgia Sister   . Arthritis Sister     osteoarthritis  . Irritable bowel syndrome Sister   . Leukemia      grandson   History  Substance Use Topics  . Smoking status: Former Smoker -- 0.80 packs/day for 20 years    Types: Cigarettes    Quit date: 08/13/1985  . Smokeless tobacco: Never Used  . Alcohol Use: Yes     Comment: rare   OB History    No data available     Review of Systems  Constitutional: Negative for fever.  HENT: Negative for congestion.   Eyes: Negative for visual disturbance.  Respiratory: Positive for shortness of breath.   Cardiovascular: Positive for chest pain.  Gastrointestinal: Positive for nausea.  Negative for abdominal pain.  Genitourinary: Negative for dysuria.  Musculoskeletal: Positive for neck pain.  Skin: Negative for rash.  Neurological: Positive for weakness and headaches.  Hematological: Does not bruise/bleed easily.  Psychiatric/Behavioral: Negative for confusion.      Allergies  Ciprofloxacin; Peanut butter flavor; and Shellfish allergy  Home Medications   Prior to Admission medications   Medication Sig Start Date End Date Taking? Authorizing Provider  ALPRAZolam (XANAX) 0.25 MG tablet TAKE 1 TABLET BY MOUTH AT BEDTIME AS NEEDED FOR ANXIETY OR SLEEP 05/14/14   [name] L Breeback, PA-C  Linaclotide (LINZESS) 290 MCG CAPS capsule Take 290 mcg by mouth as needed. 03/02/14   [name] L Breeback, PA-C  MOVIPREP 100 G SOLR Take 1 kit (200 g total) by mouth as directed. 03/23/14   Amy S Esterwood, PA-C  traMADol (ULTRAM) 50 MG tablet Take 1 tablet (50 mg total) by mouth every 6 (six) hours as needed. 12/30/14   Fredia Sorrow, MD   BP 116/69 mmHg  Pulse 84  Temp(Src) 97.5 F (36.4 C) (Oral)  Resp 18  Ht '5\' 5"'  (1.651 m)  Wt 147 lb (66.679 kg)  BMI 24.46 kg/m2  SpO2 100% Physical Exam  Constitutional: She is  oriented to person, place, and time. She appears well-developed and well-nourished. No distress.  HENT:  Head: Normocephalic and atraumatic.  Mouth/Throat: Oropharynx is clear and moist.  Eyes: Conjunctivae and EOM are normal. Pupils are equal, round, and reactive to light.  Neck: Normal range of motion.  Cardiovascular: Normal rate, regular rhythm and intact distal pulses.   No murmur heard. Pulmonary/Chest: Effort normal and breath sounds normal. No respiratory distress.  Abdominal: Soft. Bowel sounds are normal. There is no tenderness.  Musculoskeletal: Normal range of motion. She exhibits no tenderness.  Neurological: She is alert and oriented to person, place, and time. No cranial nerve deficit. She exhibits normal muscle tone. Coordination normal.  Skin: Skin is  warm. No rash noted.  Nursing note and vitals reviewed.   ED Course  Procedures (including critical care time) Labs Review Labs Reviewed  COMPREHENSIVE METABOLIC PANEL - Abnormal; Notable for the following:    Glucose, Bld 105 (*)    All other components within normal limits  CBC WITH DIFFERENTIAL/PLATELET  URINALYSIS, ROUTINE W REFLEX MICROSCOPIC  TROPONIN I  LIPASE, BLOOD   Results for orders placed or performed during the hospital encounter of 12/30/14  Comprehensive metabolic panel  Result Value Ref Range   Sodium 140 135 - 145 mmol/L   Potassium 4.2 3.5 - 5.1 mmol/L   Chloride 102 101 - 111 mmol/L   CO2 30 22 - 32 mmol/L   Glucose, Bld 105 (H) 65 - 99 mg/dL   BUN 13 6 - 20 mg/dL   Creatinine, Ser 0.80 0.44 - 1.00 mg/dL   Calcium 10.0 8.9 - 10.3 mg/dL   Total Protein 7.5 6.5 - 8.1 g/dL   Albumin 4.5 3.5 - 5.0 g/dL   AST 23 15 - 41 U/L   ALT 18 14 - 54 U/L   Alkaline Phosphatase 57 38 - 126 U/L   Total Bilirubin 0.3 0.3 - 1.2 mg/dL   GFR calc non Af Amer >60 >60 mL/min   GFR calc Af Amer >60 >60 mL/min   Anion gap 8 5 - 15  CBC with Differential/Platelet  Result Value Ref Range   WBC 5.5 4.0 - 10.5 K/uL   RBC 4.20 3.87 - 5.11 MIL/uL   Hemoglobin 13.0 12.0 - 15.0 g/dL   HCT 37.4 36.0 - 46.0 %   MCV 89.0 78.0 - 100.0 fL   MCH 31.0 26.0 - 34.0 pg   MCHC 34.8 30.0 - 36.0 g/dL   RDW 12.0 11.5 - 15.5 %   Platelets 328 150 - 400 K/uL   Neutrophils Relative % 58 43 - 77 %   Neutro Abs 3.2 1.7 - 7.7 K/uL   Lymphocytes Relative 29 12 - 46 %   Lymphs Abs 1.6 0.7 - 4.0 K/uL   Monocytes Relative 11 3 - 12 %   Monocytes Absolute 0.6 0.1 - 1.0 K/uL   Eosinophils Relative 1 0 - 5 %   Eosinophils Absolute 0.1 0.0 - 0.7 K/uL   Basophils Relative 1 0 - 1 %   Basophils Absolute 0.0 0.0 - 0.1 K/uL  Urinalysis, Routine w reflex microscopic  Result Value Ref Range   Color, Urine YELLOW YELLOW   APPearance CLEAR CLEAR   Specific Gravity, Urine 1.017 1.005 - 1.030   pH 6.0 5.0  - 8.0   Glucose, UA NEGATIVE NEGATIVE mg/dL   Hgb urine dipstick NEGATIVE NEGATIVE   Bilirubin Urine NEGATIVE NEGATIVE   Ketones, ur NEGATIVE NEGATIVE mg/dL   Protein, ur NEGATIVE NEGATIVE  mg/dL   Urobilinogen, UA 0.2 0.0 - 1.0 mg/dL   Nitrite NEGATIVE NEGATIVE   Leukocytes, UA NEGATIVE NEGATIVE  Troponin I  Result Value Ref Range   Troponin I <0.03 <0.031 ng/mL  Lipase, blood  Result Value Ref Range   Lipase 34 22 - 51 U/L     Imaging Review Dg Chest 2 View  12/30/2014   CLINICAL DATA:  Left chest pain radiating to the back for 2 days  EXAM: CHEST  2 VIEW  COMPARISON:  MRI thoracic spine 02/17/2014  FINDINGS: The heart size and mediastinal contours are within normal limits. Both lungs are clear. The visualized skeletal structures are unremarkable.  IMPRESSION: No active cardiopulmonary disease.   Electronically Signed   By: Kathreen Devoid   On: 12/30/2014 19:21   Ct Head Wo Contrast  12/30/2014   CLINICAL DATA:  Pain in the back of the neck for 1 week.  EXAM: CT HEAD WITHOUT CONTRAST  CT CERVICAL SPINE WITHOUT CONTRAST  TECHNIQUE: Multidetector CT imaging of the head and cervical spine was performed following the standard protocol without intravenous contrast. Multiplanar CT image reconstructions of the cervical spine were also generated.  COMPARISON:  Head CT- 06/16/2012; 01/31/2012; 10/18/2008  FINDINGS: CT HEAD FINDINGS  Similar findings of mild atrophy with sulcal prominence and mild prominence of the bifrontal extra-axial spaces. The gray-white differentiation is maintained. No CT evidence of acute large territory infarct. No intraparenchymal or extra-axial mass or hemorrhage. Normal size and configuration of the ventricles and basilar cisterns. No midline shift. Intracranial atherosclerosis. Limited visualization the paranasal sinuses and mastoid air cells is normal. No air-fluid levels. Regional soft tissues appear normal. No displaced calvarial fracture.  CT CERVICAL SPINE FINDINGS  C1  to the superior endplate of T2 is imaged.  Normal alignment of the cervical spine. No anterolisthesis or retrolisthesis. The bilateral facets are normally aligned. The dens is normally positioned between the lateral masses of C1. Normal atlantodental and atlantoaxial articulations. The bilateral facets are normally aligned.  No fracture or static subluxation of the cervical spine.  Cervical vertebral body heights are preserved. Prevertebral soft tissues are normal.  Mild multilevel cervical spine DDD, worse at C5-C6 with disc space height loss, irregularity sclerosis.  There is partial ossification of the nuchal ligament posterior to the C6 spinous process. Very minimal amount of atherosclerotic plaque within the left carotid bulb. No bulky cervical lymphadenopathy on this noncontrast examination. Normal noncontrast appearance of the thyroid gland.  Limited visualization lung apices is normal.  There is a very minimal amount of air seen within the bilateral subclavian veins, likely the sequela of recent intravenous medication administration and/or blood draw.  IMPRESSION: 1. Similar findings of mild atrophy without acute intracranial process. 2. No fracture or static subluxation of the cervical spine. 3. Mild multilevel cervical spine DDD, worse at C5-C6.   Electronically Signed   By: Sandi Mariscal M.D.   On: 12/30/2014 21:33   Ct Angio Chest Pe W/cm &/or Wo Cm  12/30/2014   CLINICAL DATA:  LEFT chest pain for 2 days with nausea and weakness, pain at back of neck for 1 week, headache, shortness of breath, history hyperlipidemia, irritable bowel syndrome, former smoker IV  EXAM: CT ANGIOGRAPHY CHEST WITH CONTRAST  TECHNIQUE: Multidetector CT imaging of the chest was performed using the standard protocol during bolus administration of intravenous contrast. Multiplanar CT image reconstructions and MIPs were obtained to evaluate the vascular anatomy.  CONTRAST:  153m OMNIPAQUE IOHEXOL 350 MG/ML SOLN  COMPARISON:   12/06/2012  FINDINGS: Minimal atherosclerotic calcification aortic arch.  Aorta normal caliber without aneurysm or dissection.  Pulmonary arteries well opacified and patent.  No evidence of pulmonary embolism.  Visualized upper abdomen unremarkable.  No thoracic adenopathy.  Minimal dependent atelectasis in both lungs.  Questionable nodularity versus pleural thickening at RIGHT major fissure 7 mm diameter image 57 unchanged since 2014.  Nodular opacity at medial LEFT upper lobe 15 x 8 mm image 32, measured 14 x 6 mm 2014 and was linear in appearance on 07/05/2011.  Remaining lungs clear.  No infiltrate, pleural effusion or pneumothorax.  No acute osseous findings.  Review of the MIP images confirms the above findings.  IMPRESSION: No evidence of pulmonary embolism.  Nodular focus at RIGHT major fissure unchanged.  Interval increase in size of a nodular focus in the medial LEFT upper lobe, appearing more solid/mass like on the current exam, concerning for tumor.  Recommend followup assessment by PET-CT imaging to assess for potential tumor.   Electronically Signed   By: Lavonia Dana M.D.   On: 12/30/2014 21:25   Ct Cervical Spine Wo Contrast  12/30/2014   CLINICAL DATA:  Pain in the back of the neck for 1 week.  EXAM: CT HEAD WITHOUT CONTRAST  CT CERVICAL SPINE WITHOUT CONTRAST  TECHNIQUE: Multidetector CT imaging of the head and cervical spine was performed following the standard protocol without intravenous contrast. Multiplanar CT image reconstructions of the cervical spine were also generated.  COMPARISON:  Head CT- 06/16/2012; 01/31/2012; 10/18/2008  FINDINGS: CT HEAD FINDINGS  Similar findings of mild atrophy with sulcal prominence and mild prominence of the bifrontal extra-axial spaces. The gray-white differentiation is maintained. No CT evidence of acute large territory infarct. No intraparenchymal or extra-axial mass or hemorrhage. Normal size and configuration of the ventricles and basilar cisterns. No  midline shift. Intracranial atherosclerosis. Limited visualization the paranasal sinuses and mastoid air cells is normal. No air-fluid levels. Regional soft tissues appear normal. No displaced calvarial fracture.  CT CERVICAL SPINE FINDINGS  C1 to the superior endplate of T2 is imaged.  Normal alignment of the cervical spine. No anterolisthesis or retrolisthesis. The bilateral facets are normally aligned. The dens is normally positioned between the lateral masses of C1. Normal atlantodental and atlantoaxial articulations. The bilateral facets are normally aligned.  No fracture or static subluxation of the cervical spine.  Cervical vertebral body heights are preserved. Prevertebral soft tissues are normal.  Mild multilevel cervical spine DDD, worse at C5-C6 with disc space height loss, irregularity sclerosis.  There is partial ossification of the nuchal ligament posterior to the C6 spinous process. Very minimal amount of atherosclerotic plaque within the left carotid bulb. No bulky cervical lymphadenopathy on this noncontrast examination. Normal noncontrast appearance of the thyroid gland.  Limited visualization lung apices is normal.  There is a very minimal amount of air seen within the bilateral subclavian veins, likely the sequela of recent intravenous medication administration and/or blood draw.  IMPRESSION: 1. Similar findings of mild atrophy without acute intracranial process. 2. No fracture or static subluxation of the cervical spine. 3. Mild multilevel cervical spine DDD, worse at C5-C6.   Electronically Signed   By: Sandi Mariscal M.D.   On: 12/30/2014 21:33     EKG Interpretation   Date/Time:  Thursday Dec 30 2014 17:01:58 EDT Ventricular Rate:  85 PR Interval:  138 QRS Duration: 90 QT Interval:  388 QTC Calculation: 461 R Axis:   45 Text Interpretation:  Normal  sinus rhythm Cannot rule out Anterior infarct  , age undetermined Abnormal ECG No significant change since last tracing  Confirmed by  Trafton Roker  MD, Treysean Petruzzi [phone]) on 12/30/2014 5:08:12 PM      MDM   Final diagnoses:  Chest pain  SOB (shortness of breath)  Headache  SOB (shortness of breath)  Headache  SOB (shortness of breath)  Headache    Workup negative for acute cardiac event. Chest x-ray was negative for any pneumonia pneumothorax or pulmonary edema. CT angios negative for pulmonary embolus however did show an increasing in size of left upper lobe lung mass that will need close and prompt follow-up. Patient's liver function tests were normal no evidence of pancreatitis. Head CT without any acute findings as CT of cervical spine shows degenerative changes in the lower part of the cervical spine. Could account for some of the neck pain. No significant leukocytosis or pneumonia urinalysis is negative for urinary tract infection no electrolyte or LFT abnormalities.      Fredia Sorrow, MD 12/30/14 [place], MD 01/11/15 [phone]

## 2014-12-30 NOTE — ED Notes (Signed)
Patient transported to CT 

## 2014-12-30 NOTE — ED Notes (Signed)
MD at bedside. 

## 2014-12-30 NOTE — ED Notes (Signed)
Pain in the back of her neck for a week. Pain has been in her left chest for 2 days with nausea and weakness.

## 2014-12-31 ENCOUNTER — Ambulatory Visit (INDEPENDENT_AMBULATORY_CARE_PROVIDER_SITE_OTHER): Payer: No Typology Code available for payment source | Admitting: Family Medicine

## 2014-12-31 ENCOUNTER — Encounter: Payer: Self-pay | Admitting: Family Medicine

## 2014-12-31 VITALS — BP 130/75 | HR 87 | Wt 136.0 lb

## 2014-12-31 DIAGNOSIS — R918 Other nonspecific abnormal finding of lung field: Secondary | ICD-10-CM

## 2014-12-31 DIAGNOSIS — R0602 Shortness of breath: Secondary | ICD-10-CM | POA: Diagnosis not present

## 2014-12-31 DIAGNOSIS — R0789 Other chest pain: Secondary | ICD-10-CM

## 2014-12-31 MED ORDER — ALPRAZOLAM 0.25 MG PO TABS: ORAL_TABLET | ORAL | Status: DC

## 2014-12-31 NOTE — Progress Notes (Signed)
Subjective:    Patient ID: Jamie [name], female    DOB: 1951/07/27, 64 y.o.   MRN: [id number]  HPI Has been under a lot of stress for about 3 years.  About a year or 2 ago noticed some tingling in her upper mid back.  Recently started having CP radiating into her back.  Says was just feeling achy in her shoulder, back, neck, low back. Started feeling SOB.   Had CT agio to f/o DVT.  She was noted to have a nodule at the right major fissure that was unchanged but also a nodule at the left upper lobe appearing more solid/masslike on exam. The radiologist felt that was concerning for tumor and had recommended PET CT scan to assess for potential tumor. + former smoker.   Review of Systems  BP 130/75 mmHg  Pulse 87  Wt 136 lb (61.689 kg)  SpO2 99%    Allergies  Allergen Reactions  . Ciprofloxacin     Facial numbness  . Peanut Butter Flavor     N/V  . Shellfish Allergy Swelling    Past Medical History  Diagnosis Date  . Anxiety   . Hyperlipidemia   . Diverticulosis   . IBS (irritable bowel syndrome)   . Depression   . Diverticulitis     Past Surgical History  Procedure Laterality Date  . Abdominal hysterectomy    . Tubal ligation    . Hemorrhoid surgery    . Tumor removal      Left thigh     History   Social History  . Marital Status: Divorced    Spouse Name: N/A  . Number of Children: 3  . Years of Education: N/A   Occupational History  . Student    Social History Main Topics  . Smoking status: Former Smoker -- 0.80 packs/day for 20 years    Types: Cigarettes    Quit date: 08/13/1985  . Smokeless tobacco: Never Used  . Alcohol Use: Yes     Comment: rare  . Drug Use: No  . Sexual Activity: Not on file   Other Topics Concern  . Not on file   Social History Narrative   Lives with and grandchild (she is foster parent for grand child)Completed school full time.   Caffeine use; occasional   Works in Personal assistant, Economist.   In process  of divorce.            Family History  Problem Relation Age of Onset  . Cancer Mother     renal cell carcinoma  . Diabetes Mother   . Heart disease Father     first MI in 48s  . Cancer Sister     cervical cancer  . Heart disease Brother     first MI in 48s  . Alcohol abuse Sister   . Fibromyalgia Sister   . Arthritis Sister     osteoarthritis  . Irritable bowel syndrome Sister   . Leukemia      grandson    Outpatient Encounter Prescriptions as of 12/31/2014  Medication Sig  . ALPRAZolam (XANAX) 0.25 MG tablet TAKE 1 TABLET BY MOUTH AT BEDTIME AS NEEDED FOR ANXIETY OR SLEEP  . [DISCONTINUED] ALPRAZolam (XANAX) 0.25 MG tablet TAKE 1 TABLET BY MOUTH AT BEDTIME AS NEEDED FOR ANXIETY OR SLEEP  . [DISCONTINUED] Linaclotide (LINZESS) 290 MCG CAPS capsule Take 290 mcg by mouth as needed.  . [DISCONTINUED] MOVIPREP 100 G SOLR Take 1 kit (200  g total) by mouth as directed.  . [DISCONTINUED] traMADol (ULTRAM) 50 MG tablet Take 1 tablet (50 mg total) by mouth every 6 (six) hours as needed.   No facility-administered encounter medications on file as of 12/31/2014.          Objective:   Physical Exam  Constitutional: She is oriented to person, place, and time. She appears well-developed and well-nourished.  HENT:  Head: Normocephalic and atraumatic.  Cardiovascular: Normal rate, regular rhythm and normal heart sounds.   Pulmonary/Chest: Effort normal and breath sounds normal.  Neurological: She is alert and oriented to person, place, and time.  Skin: Skin is warm and dry.  Psychiatric: She has a normal mood and affect. Her behavior is normal.          Assessment & Plan:   lung mass, left upper lobe-we'll go forward with ordering PET scan. If we are able to get this process with her insurance then recommend referral to pulmonology. Former smoker.   SOB - not SOB today. CT was neg for PE, etc.   Atypical chest pain - EKG was normal at ED.  Radiates to back. Consider  gastritis vs GERD.  The mass shouldn't be causeing pain per se.

## 2014-12-31 NOTE — Patient Instructions (Signed)
Positron Emission Tomography (PET Scan) PET stands for positron emission tomography. This is a test similar to an X-ray. Pictures can be taken of a body part after injection of a very small dose of a chemical called a radionuclide. This is combined with sugar, water, or ammonia to give off tiny particles called positrons. The positrons emitted are like small bursts of energy that can be detected by a scanner. They are processed by a computer to create images. These images can be used to study different diseases. They are often used to study cancer and cancer therapy. A scan of the entire body can be done and used to study all its parts. Because this test is tagged to a sugar used by cells, the bursts of energy show up differently in cells that use sugar faster. The computer is able to produce a color-coded picture based on this. The colors and amount of brightness on a PET image show different levels of tissue or organ function. For example, a cancer grows faster than healthy tissue and uses more sugar than normal tissue. It will absorb more of the substance injected. This causes it to appear brighter than normal tissue on the PET image. A specialist will read and explain the images. Other examinations, such as recent CT (or CAT) scans or MRI scans may help with interpretation and should be brought along. There are usually no restrictions after the test. You should drink plenty of fluids to flush the radioactive substance from your body. BEFORE THE PROCEDURE   PET is usually an outpatient procedure. Wear comfortable, loose-fitting clothes.  Do not eat for four hours before the scan. You will be encouraged to drink water.  Your caregiver will instruct you regarding the use of medications before the test.  Note: Diabetic patients should ask for any specific diet guidelines to control glucose (sugar) levels during the day of the test. There are limitations with the test if your blood sugar is not controlled  during or before the test.  Be on time because of the rapid decay of the radioactive material that must be injected. PROCEDURE  Before the procedure begins a small amount of harmless radioactive material will be injected into a vein. This means you will have a needle stick. It will take from 30 minutes to one hour for the material to travel around your body in preparation for the scan. You will lie on a cushioned table and be moved through the center of a machine that looks like a large doughnut. This is the machine that detects the positrons. It is connected to a computer that produces images that can be viewed on a monitor. This will take about 30 minutes to an hour, during which you must remain still. Let your caregiver know if this will be difficult for you. Also, let your caregiver know if you need a sedative or help dealing with claustrophobia (feeling uncomfortable in enclosed spaces). HOME CARE INSTRUCTIONS   For the protection of your privacy, test results can not be given over the phone. Make sure you receive the results of your test. Ask as to how these results are to be obtained if you have not been informed. It is your responsibility to obtain your test results.  Drink several 8-once glasses of water following the test to flush the small amount of radioactive material out of your body.  Keep your follow-up appointments. Document Released: 02/03/2003 Document Revised: 10/22/2011 Document Reviewed: 11/11/2013 [facility] Patient Information 2015 [place], [place]. This information  is not intended to replace advice given to you by your health care provider. Make sure you discuss any questions you have with your health care provider.  

## 2015-01-04 ENCOUNTER — Other Ambulatory Visit: Payer: Self-pay | Admitting: Family Medicine

## 2015-01-04 DIAGNOSIS — Z1231 Encounter for screening mammogram for malignant neoplasm of breast: Secondary | ICD-10-CM

## 2015-01-14 ENCOUNTER — Telehealth: Payer: Self-pay | Admitting: *Deleted

## 2015-01-14 ENCOUNTER — Telehealth: Payer: Self-pay | Admitting: Family Medicine

## 2015-01-14 ENCOUNTER — Ambulatory Visit (HOSPITAL_COMMUNITY)
Admission: RE | Admit: 2015-01-14 | Discharge: 2015-01-14 | Disposition: A | Discharge status: Home / Self Care | Payer: Medicaid Other | Source: Ambulatory Visit | Attending: Family Medicine | Admitting: Family Medicine

## 2015-01-14 ENCOUNTER — Other Ambulatory Visit: Payer: Self-pay | Admitting: Family Medicine

## 2015-01-14 DIAGNOSIS — R918 Other nonspecific abnormal finding of lung field: Secondary | ICD-10-CM

## 2015-01-14 DIAGNOSIS — I700 Atherosclerosis of aorta: Secondary | ICD-10-CM | POA: Insufficient documentation

## 2015-01-14 LAB — GLUCOSE, CAPILLARY: Glucose-Capillary: 101 mg/dL — ABNORMAL HIGH (ref 65–99)

## 2015-01-14 MED ORDER — FLUDEOXYGLUCOSE F - 18 (FDG) INJECTION
6.2000 | Freq: Once | INTRAVENOUS | Status: AC | PRN
  Administered 2015-01-14: 6.2 via INTRAVENOUS

## 2015-01-14 NOTE — Telephone Encounter (Signed)
Patient contacted and results discussed. Also called the radiology department. They recommended referral to cardiovascular surgery so that she can be added on to the thoracic conference possibly for Thursday. She might benefit from just surgical wedge resection versus biopsy.

## 2015-01-14 NOTE — Telephone Encounter (Signed)
I called Rochele back and reviewed her results again. Also discussed with her that we are referring her to cardiothoracic surgery. We are working on that as a priority. Hopefully she will get a call either later today or possibly on Monday for an appointment time.

## 2015-01-14 NOTE — Addendum Note (Signed)
Addended by: Beatrice Lecher D on: 01/14/2015 01:06 PM   Modules accepted: Orders

## 2015-01-14 NOTE — Telephone Encounter (Signed)
Pt called back after you spoke with her & wants you to call her back.  She said that she got really upset and didn't really hear or understand anything you said.  I tried to help her but she wanted to talk to you directly again.

## 2015-01-18 ENCOUNTER — Encounter: Payer: Self-pay | Admitting: Thoracic Surgery (Cardiothoracic Vascular Surgery)

## 2015-01-18 ENCOUNTER — Institutional Professional Consult (permissible substitution) (INDEPENDENT_AMBULATORY_CARE_PROVIDER_SITE_OTHER): Payer: Medicaid Other | Admitting: Thoracic Surgery (Cardiothoracic Vascular Surgery)

## 2015-01-18 ENCOUNTER — Other Ambulatory Visit: Payer: Self-pay | Admitting: *Deleted

## 2015-01-18 VITALS — BP 117/75 | HR 84 | Resp 16 | Ht 65.0 in | Wt 145.0 lb

## 2015-01-18 DIAGNOSIS — R911 Solitary pulmonary nodule: Secondary | ICD-10-CM

## 2015-01-18 DIAGNOSIS — D381 Neoplasm of uncertain behavior of trachea, bronchus and lung: Secondary | ICD-10-CM

## 2015-01-18 NOTE — Progress Notes (Signed)
PCP is Jamie Planas, PA-C Referring Provider is Hali Marry, *  Chief Complaint  Patient presents with  . Lung Lesion    LULobe per CTA CHEST 12/30/14, CT HEAD 12/30/14    HPI: 64 year old woman sent for consultation regarding a left upper lobe lung nodule.  Jamie [name] is a 64 year old woman with a remote history of tobacco use (<1 pack a day for 20 years prior to quitting in the 1980s). She recently was seen in the emergency room with a complaint of chest pain. She described this as a severe unrelenting pain across her chest and around her back and then a sharp stabbing pain in her left side. This pain was constant and was not related to activity. She also complains of almost constant belching. Her workup included an EKG which showed no ischemic changes. Cardiac enzymes were negative. A CT angiogram of the chest showed no evidence of pulmonary embolus. There were bilateral lung nodules noted. A 6 mm nodule in the right lower lobe was unchanged from a CT of the chest in 2014. The area previously thought to be scar now was more nodular in nature in the left upper lobe. A PET CT was done which showed the lesion was hypermetabolic with an SUV of 3.  She is due to have chest pain but now says is changed. Is now starts in her back and feels like it's coming through her left breast and out her left nipple. This continues to be constant pain unrelated to exertion. She has tried Zantac, Pepcid, and Prilosec, but has not noticed much improvement.  She also complains of headaches and neck pain. A CT of the head was unrevealing, she did have some cervical spinal disease that may explain some of her neck pain.  She has a frequent cough occasionally productive of greenish sputum. She denies hemoptysis. She sometimes feels like her heart is beating very strongly. She does not have any shortness of breath with normal daily activities but sometimes feels like her pain is "taking her breath away". She has  lost about 42 pounds over the past couple of years with diet and exercise. She has not had any weight loss over the past 6 months.  Zubrod Score: At the time of surgery this patient's most appropriate activity status/level should be described as: []     0    Normal activity, no symptoms [x]     1    Restricted in physical strenuous activity but ambulatory, able to do out light work []     2    Ambulatory and capable of self care, unable to do work activities, up and about >50 % of waking hours                              []     3    Only limited self care, in bed greater than 50% of waking hours []     4    Completely disabled, no self care, confined to bed or chair []     5    Moribund    Past Medical History  Diagnosis Date  . Anxiety   . Hyperlipidemia   . Diverticulosis   . IBS (irritable bowel syndrome)   . Depression   . Diverticulitis     Past Surgical History  Procedure Laterality Date  . Abdominal hysterectomy    . Tubal ligation    . Hemorrhoid surgery    .  Tumor removal      Left thigh     Family History  Problem Relation Age of Onset  . Cancer Mother     renal cell carcinoma  . Diabetes Mother   . Heart disease Father     first MI in 13s  . Cancer Sister     cervical cancer  . Heart disease Brother     first MI in 4s  . Alcohol abuse Sister   . Fibromyalgia Sister   . Arthritis Sister     osteoarthritis  . Irritable bowel syndrome Sister   . Leukemia      grandson    Social History History  Substance Use Topics  . Smoking status: Former Smoker -- 0.80 packs/day for 20 years    Types: Cigarettes    Quit date: 08/13/1985  . Smokeless tobacco: Never Used  . Alcohol Use: Yes     Comment: rare    Current Outpatient Prescriptions  Medication Sig Dispense Refill  . ALPRAZolam (XANAX) 0.25 MG tablet TAKE 1 TABLET BY MOUTH AT BEDTIME AS NEEDED FOR ANXIETY OR SLEEP 30 tablet 0   No current facility-administered medications for this visit.     Allergies  Allergen Reactions  . Ciprofloxacin     Facial numbness  . Peanut Butter Flavor     N/V  . Shellfish Allergy Swelling    Review of Systems  Constitutional: Positive for activity change. Negative for fever, chills, fatigue and unexpected weight change.  Eyes: Positive for visual disturbance (blurry vision with headaches).  Respiratory: Positive for cough and shortness of breath. Negative for wheezing.   Cardiovascular: Positive for chest pain and palpitations. Negative for leg swelling.  Gastrointestinal: Positive for constipation.       Reflux  Genitourinary: Positive for frequency.  Musculoskeletal: Positive for back pain and neck pain.  Skin:       Attending  Neurological: Positive for speech difficulty (Slurred at times) and headaches.       Memory problems  Psychiatric/Behavioral: The patient is nervous/anxious.   All other systems reviewed and are negative.   BP 117/75 mmHg  Pulse 84  Resp 16  Ht 5\' 5"  (1.651 m)  Wt 145 lb (65.772 kg)  BMI 24.13 kg/m2  SpO2 98% Physical Exam  Constitutional: She is oriented to person, place, and time. She appears well-developed and well-nourished. No distress.  HENT:  Head: Normocephalic and atraumatic.  Eyes: EOM are normal. Pupils are equal, round, and reactive to light.  Neck: Neck supple. No thyromegaly present.  Cardiovascular: Normal rate, regular rhythm, normal heart sounds and intact distal pulses.  Exam reveals no gallop and no friction rub.   No murmur heard. Pulmonary/Chest: Effort normal and breath sounds normal. She has no wheezes. She has no rales.  Abdominal: Soft. There is no tenderness.  Musculoskeletal: She exhibits no edema.  Lymphadenopathy:    She has no cervical adenopathy.  Neurological: She is alert and oriented to person, place, and time. No cranial nerve deficit.  No focal motor deficit  Skin: Skin is warm and dry.  Psychiatric:  Very anxious  Vitals reviewed.    Diagnostic  Tests: CT ANGIOGRAPHY CHEST WITH CONTRAST  TECHNIQUE: Multidetector CT imaging of the chest was performed using the standard protocol during bolus administration of intravenous contrast. Multiplanar CT image reconstructions and MIPs were obtained to evaluate the vascular anatomy.  CONTRAST: 124mL OMNIPAQUE IOHEXOL 350 MG/ML SOLN  COMPARISON: 12/06/2012  FINDINGS: Minimal atherosclerotic calcification aortic arch.  Aorta  normal caliber without aneurysm or dissection.  Pulmonary arteries well opacified and patent.  No evidence of pulmonary embolism.  Visualized upper abdomen unremarkable.  No thoracic adenopathy.  Minimal dependent atelectasis in both lungs.  Questionable nodularity versus pleural thickening at RIGHT major fissure 7 mm diameter image 57 unchanged since 2014.  Nodular opacity at medial LEFT upper lobe 15 x 8 mm image 32, measured 14 x 6 mm 2014 and was linear in appearance on 07/05/2011.  Remaining lungs clear.  No infiltrate, pleural effusion or pneumothorax.  No acute osseous findings.  Review of the MIP images confirms the above findings.  IMPRESSION: No evidence of pulmonary embolism.  Nodular focus at RIGHT major fissure unchanged.  Interval increase in size of a nodular focus in the medial LEFT upper lobe, appearing more solid/mass like on the current exam, concerning for tumor.  Recommend followup assessment by PET-CT imaging to assess for potential tumor.   Electronically Signed  By: Lavonia Dana M.D.  On: 12/30/2014 21:25   CLINICAL DATA: Initial treatment strategy for lung mass.  EXAM: NUCLEAR MEDICINE PET SKULL BASE TO THIGH  TECHNIQUE: 6.2 mCi F-18 FDG was injected intravenously. Full-ring PET imaging was performed from the skull base to thigh after the radiotracer. CT data was obtained and used for attenuation correction and anatomic localization.  FASTING BLOOD GLUCOSE: Value: 101  mg/dl  COMPARISON: CT chest 12/30/2014  FINDINGS: NECK  No hypermetabolic lymph nodes in the neck.  CHEST  Within the medial left upper lobe there is a 1.4 x 0.8 x 1.4 cm nodular opacity. The SUV max associated with this nodule is equal to 3.07 which is within the malignant range. Perifissural nodule within the right midlung measures 6 mm and is too small to characterize, image 30/series 8.  No hypermetabolic mediastinal or hilar lymph nodes. Aortic atherosclerosis noted.  ABDOMEN/PELVIS  No abnormal hypermetabolic activity within the liver, pancreas, adrenal glands, or spleen. No hypermetabolic lymph nodes in the abdomen or pelvis.  SKELETON  No focal hypermetabolic activity to suggest skeletal metastasis.  IMPRESSION: 1. There is malignant range FDG uptake associated with the left upper lobe nodular density, which may represent a small pulmonary neoplasm. Correlation with tissue sampling recommended. 2. No evidence for hypermetabolic mediastinal or hilar adenopathy or evidence of distant metastatic disease. 3. The perifissural nodule within the right lung is too small to characterize.   Electronically Signed  By: Kerby Moors M.D.  On: 01/14/2015 09:31     Impression: 64 year old woman with a left upper lobe nodule that has enlarged since 2014 and has malignant range FDG uptake on PET/CT. Despite her relatively light smoking history, this is concerning for a primary bronchogenic carcinoma. It has to be considered that unless it can be proven otherwise.  She also has a small nodule in the right lung that is unchanged over the past 2 years.  I personally reviewed the CT and PET/CT and concur with the findings as noted above.  I had a long discussion with Ms. Haidar and 3 of her sisters. I reviewed the CT and PET CT with them. We discussed the differential diagnosis and potential diagnostic and treatment algorithms. This lesion is not amenable to  CT-guided biopsy. Bronchoscopic biopsy might be possible but it is in a difficult location to reach, and a negative biopsy would not be reliable. My recommendation was that we proceed with a left VATS for wedge resection and then possible left upper lobe segmentectomy if the lesion is cancerous. I reviewed  the reasoning behind anatomic resection with her.  I described the general nature of the procedure including the need for general anesthesia, the incisions to be used, the use of chest tubes postoperatively, the expected hospital stay, and the overall recovery. I reviewed the indications, risks, benefits, and alternatives. She understands that the risks include, but are not limited to death, MI, DVT, PE, bleeding, possible need for transfusion, infection, conversion to open, prolonged air leaks, cardiac arrhythmias, as well as the possibility of other unforeseeable complications. We did discuss the use of cryo-analgesia for pain management. She understands the risks and benefits of that as well.  Her left-sided chest pain is mysterious. It sounds most consistent with reflux-type pain particularly given its association with belching. Does not sound cardiac in nature. I did tell her that I do not think it has anything to do with her lung nodule.  Plan: Pulmonary function testing  Left VATS, possible wedge resection, possible left upper lobe segmentectomy, cryo-analgesia intercostal nerves on Thursday, 01/27/2015  Melrose Nakayama, MD [facility] [phone]  I spent 45 minutes face to face with Ms. Mixon and her sisters during this visit

## 2015-01-24 ENCOUNTER — Ambulatory Visit (HOSPITAL_BASED_OUTPATIENT_CLINIC_OR_DEPARTMENT_OTHER)
Admission: RE | Admit: 2015-01-24 | Discharge: 2015-01-24 | Disposition: A | Discharge status: Home / Self Care | Payer: Medicaid Other | Source: Ambulatory Visit | Attending: Family Medicine | Admitting: Family Medicine

## 2015-01-24 DIAGNOSIS — Z1231 Encounter for screening mammogram for malignant neoplasm of breast: Secondary | ICD-10-CM | POA: Diagnosis present

## 2015-01-25 ENCOUNTER — Ambulatory Visit (HOSPITAL_COMMUNITY)
Admission: RE | Admit: 2015-01-25 | Discharge: 2015-01-25 | Disposition: A | Discharge status: Home / Self Care | Payer: Medicaid Other | Source: Ambulatory Visit | Attending: Thoracic Surgery (Cardiothoracic Vascular Surgery) | Admitting: Thoracic Surgery (Cardiothoracic Vascular Surgery)

## 2015-01-25 ENCOUNTER — Other Ambulatory Visit (HOSPITAL_COMMUNITY): Payer: No Typology Code available for payment source

## 2015-01-25 DIAGNOSIS — R0609 Other forms of dyspnea: Secondary | ICD-10-CM | POA: Diagnosis not present

## 2015-01-25 DIAGNOSIS — R911 Solitary pulmonary nodule: Secondary | ICD-10-CM | POA: Insufficient documentation

## 2015-01-25 DIAGNOSIS — R05 Cough: Secondary | ICD-10-CM | POA: Diagnosis not present

## 2015-01-25 DIAGNOSIS — F17210 Nicotine dependence, cigarettes, uncomplicated: Secondary | ICD-10-CM | POA: Insufficient documentation

## 2015-01-25 LAB — PULMONARY FUNCTION TEST
DL/VA % PRED: 103 %
DL/VA: 5.09 ml/min/mmHg/L
DLCO unc % pred: 82 %
DLCO unc: 21.17 ml/min/mmHg
FEF 25-75 PRE: 1.28 L/s
FEF2575-%PRED-PRE: 55 %
FEV1-%Pred-Pre: 71 %
FEV1-PRE: 1.86 L
FEV1FVC-%Pred-Pre: 92 %
FEV6-%Pred-Pre: 77 %
FEV6-Pre: 2.51 L
FEV6FVC-%Pred-Pre: 101 %
FVC-%Pred-Pre: 76 %
FVC-PRE: 2.57 L
PRE FEV6/FVC RATIO: 97 %
Pre FEV1/FVC ratio: 72 %
RV % pred: 85 %
RV: 1.77 L
TLC % PRED: 86 %
TLC: 4.52 L

## 2015-01-26 ENCOUNTER — Ambulatory Visit: Payer: No Typology Code available for payment source

## 2015-02-02 ENCOUNTER — Other Ambulatory Visit (HOSPITAL_COMMUNITY): Payer: No Typology Code available for payment source

## 2015-02-15 ENCOUNTER — Encounter (HOSPITAL_COMMUNITY)
Admission: RE | Admit: 2015-02-15 | Discharge: 2015-02-15 | Disposition: A | Discharge status: Home / Self Care | Payer: Medicaid Other | Source: Ambulatory Visit | Attending: Thoracic Surgery (Cardiothoracic Vascular Surgery) | Admitting: Thoracic Surgery (Cardiothoracic Vascular Surgery)

## 2015-02-15 ENCOUNTER — Other Ambulatory Visit: Payer: Self-pay

## 2015-02-15 ENCOUNTER — Encounter (HOSPITAL_COMMUNITY): Payer: Self-pay

## 2015-02-15 ENCOUNTER — Emergency Department (HOSPITAL_COMMUNITY)
Admission: EM | Admit: 2015-02-15 | Discharge: 2015-02-15 | Disposition: A | Discharge status: Home / Self Care | Payer: Medicaid Other | Attending: Emergency Medicine | Admitting: Emergency Medicine

## 2015-02-15 ENCOUNTER — Encounter (HOSPITAL_COMMUNITY): Payer: Self-pay | Admitting: Vascular Surgery

## 2015-02-15 ENCOUNTER — Encounter (HOSPITAL_COMMUNITY): Payer: Self-pay | Admitting: Physical Medicine and Rehabilitation

## 2015-02-15 VITALS — BP 138/65 | HR 87 | Temp 98.2°F | Resp 18 | Ht 65.0 in | Wt 150.1 lb

## 2015-02-15 DIAGNOSIS — M419 Scoliosis, unspecified: Secondary | ICD-10-CM | POA: Diagnosis not present

## 2015-02-15 DIAGNOSIS — Z01818 Encounter for other preprocedural examination: Secondary | ICD-10-CM | POA: Insufficient documentation

## 2015-02-15 DIAGNOSIS — Z8639 Personal history of other endocrine, nutritional and metabolic disease: Secondary | ICD-10-CM | POA: Insufficient documentation

## 2015-02-15 DIAGNOSIS — Z01810 Encounter for preprocedural cardiovascular examination: Secondary | ICD-10-CM | POA: Diagnosis not present

## 2015-02-15 DIAGNOSIS — Z79899 Other long term (current) drug therapy: Secondary | ICD-10-CM | POA: Diagnosis not present

## 2015-02-15 DIAGNOSIS — Z87891 Personal history of nicotine dependence: Secondary | ICD-10-CM | POA: Diagnosis not present

## 2015-02-15 DIAGNOSIS — R911 Solitary pulmonary nodule: Secondary | ICD-10-CM | POA: Diagnosis not present

## 2015-02-15 DIAGNOSIS — Z8719 Personal history of other diseases of the digestive system: Secondary | ICD-10-CM | POA: Insufficient documentation

## 2015-02-15 DIAGNOSIS — Z01812 Encounter for preprocedural laboratory examination: Secondary | ICD-10-CM | POA: Insufficient documentation

## 2015-02-15 DIAGNOSIS — F419 Anxiety disorder, unspecified: Secondary | ICD-10-CM | POA: Insufficient documentation

## 2015-02-15 DIAGNOSIS — R079 Chest pain, unspecified: Secondary | ICD-10-CM | POA: Insufficient documentation

## 2015-02-15 DIAGNOSIS — R55 Syncope and collapse: Secondary | ICD-10-CM | POA: Diagnosis present

## 2015-02-15 DIAGNOSIS — F329 Major depressive disorder, single episode, unspecified: Secondary | ICD-10-CM | POA: Diagnosis not present

## 2015-02-15 HISTORY — DX: Headache: R51

## 2015-02-15 HISTORY — DX: Headache, unspecified: R51.9

## 2015-02-15 LAB — BLOOD GAS, ARTERIAL
Acid-Base Excess: 1.1 mmol/L (ref 0.0–2.0)
BICARBONATE: 25.1 meq/L — AB (ref 20.0–24.0)
Drawn by: 421801
FIO2: 0.21 %
O2 Saturation: 98.6 %
Patient temperature: 98.6
TCO2: 26.2 mmol/L (ref 0–100)
pCO2 arterial: 38.7 mmHg (ref 35.0–45.0)
pH, Arterial: 7.427 (ref 7.350–7.450)
pO2, Arterial: 104 mmHg — ABNORMAL HIGH (ref 80.0–100.0)

## 2015-02-15 LAB — I-STAT TROPONIN, ED: Troponin i, poc: 0 ng/mL (ref 0.00–0.08)

## 2015-02-15 LAB — URINALYSIS, ROUTINE W REFLEX MICROSCOPIC
Bilirubin Urine: NEGATIVE
Glucose, UA: NEGATIVE mg/dL
Hgb urine dipstick: NEGATIVE
KETONES UR: NEGATIVE mg/dL
LEUKOCYTES UA: NEGATIVE
NITRITE: NEGATIVE
PH: 6.5 (ref 5.0–8.0)
Protein, ur: NEGATIVE mg/dL
SPECIFIC GRAVITY, URINE: 1.003 — AB (ref 1.005–1.030)
Urobilinogen, UA: 0.2 mg/dL (ref 0.0–1.0)

## 2015-02-15 LAB — COMPREHENSIVE METABOLIC PANEL
ALBUMIN: 4.1 g/dL (ref 3.5–5.0)
ALT: 17 U/L (ref 14–54)
ANION GAP: 6 (ref 5–15)
AST: 23 U/L (ref 15–41)
Alkaline Phosphatase: 54 U/L (ref 38–126)
BILIRUBIN TOTAL: 0.5 mg/dL (ref 0.3–1.2)
BUN: 13 mg/dL (ref 6–20)
CHLORIDE: 101 mmol/L (ref 101–111)
CO2: 29 mmol/L (ref 22–32)
Calcium: 9.2 mg/dL (ref 8.9–10.3)
Creatinine, Ser: 0.87 mg/dL (ref 0.44–1.00)
GFR calc Af Amer: >60 mL/min (ref >60)
Glucose, Bld: 117 mg/dL — ABNORMAL HIGH (ref 65–99)
POTASSIUM: 4.4 mmol/L (ref 3.5–5.1)
Sodium: 136 mmol/L (ref 135–145)
Total Protein: 7.1 g/dL (ref 6.5–8.1)

## 2015-02-15 LAB — CBC
HCT: 38.7 % (ref 36.0–46.0)
Hemoglobin: 13.5 g/dL (ref 12.0–15.0)
MCH: 30.8 pg (ref 26.0–34.0)
MCHC: 34.9 g/dL (ref 30.0–36.0)
MCV: 88.2 fL (ref 78.0–100.0)
Platelets: 303 10*3/uL (ref 150–400)
RBC: 4.39 MIL/uL (ref 3.87–5.11)
RDW: 12.4 % (ref 11.5–15.5)
WBC: 4.1 10*3/uL (ref 4.0–10.5)

## 2015-02-15 LAB — SURGICAL PCR SCREEN
MRSA, PCR: NEGATIVE
Staphylococcus aureus: NEGATIVE

## 2015-02-15 NOTE — Progress Notes (Signed)
Called to see patient in pre-admissions lab for reported chest pressure and near syncopal episode.  Staff RN reports that patient is here for pre-op lab work and xray and 12 lead EKG.  Phlelbotomy tech reports she was drawing patients ABG when patient suddenly became pale and cool and diaphoretic.  Patient states she did not feel pain with the arterial stick.  Patient now reports feeling some light pressure in her chest - denies SOB or pain does endorse some tingling in both of her arms and some blurred vision.  She is alert and oriented - speech clear - no neuro deficits noted.  Resps reg and unlabored.  Skin is cool and dry.  Color pink.  NAD.  Vital signs taken by staff during event and PTA RRT - bp 138/65  HR 87 RR 16  RA O2sats 100%.  12 lead done prior to event reviewed - NSR - no acute findings.  Patient advised to go to ED to get evaluated.  Placed in wheelchair - stands without problem - continues to state " I just feel funny."  Transported to ED via wheelchair without incident - daughter present - handoff to RN at triage - Jinny Blossom.  Questions answered.

## 2015-02-15 NOTE — ED Notes (Signed)
MD at bedside. 

## 2015-02-15 NOTE — ED Provider Notes (Signed)
CSN: [id number]     Arrival date & time 02/15/15  1443 History   First MD Initiated Contact with Patient 02/15/15 1538     Chief Complaint  Patient presents with  . Near Syncope  . Chest Pain     (Consider location/radiation/quality/duration/timing/severity/associated sxs/prior Treatment) Patient is a 64 y.o. female presenting with near-syncope and chest pain. The history is provided by the patient.  Near Syncope Associated symptoms include chest pain. Pertinent negatives include no abdominal pain, no headaches and no shortness of breath.  Chest Pain Associated symptoms: near-syncope   Associated symptoms: no abdominal pain, no back pain, no fever, no headache, no shortness of breath and not vomiting   Patient presents after near syncopal episode that occurred while her blood was being drawn just pta today. Pt was getting pre-op workup/labs done (re lung nodule, planned surgery later this week).  Pt had just been stuck for abg, blood was being drawn, when phlebotomist noted that patient appeared pale-looking and faint.  Pt felt warm, flushed, mildly nauseated, sl tight in chest. Denies palpitations. Denies other hx syncope or dysrhythmia. No loc w event today. Denies any other recent cp or discomfort. No unusual doe. No hx cad, although notes fam hx cad in older female relatives. No pleuritic pain. No leg pain or swelling. States recent health otherwise at baseline. Had eaten a biscuit much earlier today. No nvd. No fever or chills. No uri c/o. No gu c/o.      Past Medical History  Diagnosis Date  . Anxiety   . Hyperlipidemia   . Diverticulosis   . IBS (irritable bowel syndrome)   . Depression   . Diverticulitis   . Headache    Past Surgical History  Procedure Laterality Date  . Abdominal hysterectomy    . Tubal ligation    . Hemorrhoid surgery    . Tumor removal      Left thigh    Family History  Problem Relation Age of Onset  . Cancer Mother     renal cell carcinoma  .  Diabetes Mother   . Heart disease Father     first MI in 39s  . Cancer Sister     cervical cancer  . Heart disease Brother     first MI in 80s  . Alcohol abuse Sister   . Fibromyalgia Sister   . Arthritis Sister     osteoarthritis  . Irritable bowel syndrome Sister   . Leukemia      grandson   History  Substance Use Topics  . Smoking status: Former Smoker -- 0.80 packs/day for 20 years    Types: Cigarettes    Quit date: 08/13/1985  . Smokeless tobacco: Never Used  . Alcohol Use: Yes     Comment: rare   OB History    No data available     Review of Systems  Constitutional: Negative for fever and chills.  HENT: Negative for sore throat.   Eyes: Negative for redness.  Respiratory: Negative for shortness of breath.   Cardiovascular: Positive for chest pain and near-syncope.  Gastrointestinal: Negative for vomiting, abdominal pain and diarrhea.  Genitourinary: Negative for flank pain.  Musculoskeletal: Negative for back pain and neck pain.  Skin: Negative for rash.  Neurological: Negative for headaches.  Hematological: Does not bruise/bleed easily.  Psychiatric/Behavioral: Negative for confusion.      Allergies  Ciprofloxacin; Peanut butter flavor; and Shellfish allergy  Home Medications   Prior to Admission medications   Medication  Sig Start Date End Date Taking? Authorizing Provider  ALOE VERA PO Take 1 Dose by mouth 2 (two) times daily. 3oz twice daily    Historical Provider, MD  ALPRAZolam (XANAX) 0.25 MG tablet TAKE 1 TABLET BY MOUTH AT BEDTIME AS NEEDED FOR ANXIETY OR SLEEP 12/31/14   Hali Marry, MD   BP 126/63 mmHg  Pulse 90  Temp(Src) 98.8 F (37.1 C) (Oral)  Resp 18  SpO2 100% Physical Exam  Constitutional: She is oriented to person, place, and time. She appears well-developed and well-nourished. No distress.  HENT:  Head: Atraumatic.  Nose: Nose normal.  Eyes: Conjunctivae are normal. No scleral icterus.  Neck: Normal range of motion.  Neck supple. No tracheal deviation present. No thyromegaly present.  Cardiovascular: Normal rate, regular rhythm, normal heart sounds and intact distal pulses.  Exam reveals no gallop and no friction rub.   No murmur heard. Pulmonary/Chest: Effort normal and breath sounds normal. No respiratory distress.  Abdominal: Soft. Normal appearance and bowel sounds are normal. She exhibits no distension. There is no tenderness.  Musculoskeletal: She exhibits no edema or tenderness.  Neurological: She is alert and oriented to person, place, and time.  Steady gait.   Skin: Skin is warm and dry. No rash noted. She is not diaphoretic.  Psychiatric: She has a normal mood and affect.  Nursing note and vitals reviewed.   ED Course  Procedures (including critical care time) Labs Review  Results for orders placed or performed during the hospital encounter of 02/15/15  I-Stat Troponin, ED (not at [facility])  Result Value Ref Range   Troponin i, poc 0.00 0.00 - 0.08 ng/mL   Comment 3           Dg Chest 2 View  02/15/2015   CLINICAL DATA:  64 year old female. Left lung VATS. Subsequent encounter.  EXAM: CHEST  2 VIEW  COMPARISON:  PET-CT 01/14/2015 and earlier.  FINDINGS: Lung volumes appear stable since May radiographs. No pneumothorax, pulmonary edema, pleural effusion or acute pulmonary opacity. Mediastinal contours remain normal. Visualized tracheal air column is within normal limits. The medial left apical lung nodule is poorly visualized radiographically. Mild scoliosis. Stable visualized osseous structures.  IMPRESSION: No new cardiopulmonary abnormality.   Electronically Signed   By: Genevie Ann M.D.   On: 02/15/2015 14:10         EKG Interpretation   Date/Time:  Tuesday February 15 2015 14:55:35 EDT Ventricular Rate:  83 PR Interval:  134 QRS Duration: 88 QT Interval:  378 QTC Calculation: 444 R Axis:   44 Text Interpretation:  Normal sinus rhythm Normal ECG Confirmed by Ashok Cordia   MD, Lennette Bihari ([id number]) on  02/15/2015 3:56:13 PM      MDM   Iv ns. Continuous pulse ox and monitor. Labs.  ecg normal.  Refill prior cardiologists noted, 1/14, noted made of non cardiac cp, and referencing prior cardiac ct with no cad and calcium score 0.  Today's symptoms related to blood draw, no other recent syncope, cp or sob.  Pt remains in sinus rhythm, asymptomatic, and appears stable for d/c.  Cbc, cmet, ua, abg done earlier today accessible in epic - results unremarkable.        Lajean Saver, MD 02/15/15 [id number]

## 2015-02-15 NOTE — Progress Notes (Signed)
Eulis Foster, RN at [employer] [facility] office notified only ABG obtained during PAT she stated she will follow up.

## 2015-02-15 NOTE — Progress Notes (Addendum)
Pt c/o light headed and burning in chest and stomach s/p blood gas draw.  VSS. Pt alert and oriented.  Rapid response nurse called.  Will continue to monitor.

## 2015-02-15 NOTE — Pre-Procedure Instructions (Signed)
    SHANNETTE TABARES  02/15/2015      [id number] - [place], [place] [address] [place] [address] Phone: [phone] Fax: [phone]    Your procedure is scheduled on February 17, 2015.  Report to [facility] Admitting at 6 A.M.  Call this number if you have problems the morning of surgery:  [phone]   Remember:  Do not eat food or drink liquids after midnight.  Take these medicines the morning of surgery with A SIP OF WATER: ALPRAZolam (XANAX) IF NEEDED    STOP ADVIL, IBUPROFEN, HERBAL MEDICATIONS AS OF TODAY   Do not wear jewelry, make-up or nail polish.  Do not wear lotions, powders, or perfumes.  You may wear deodorant.  Do not shave 48 hours prior to surgery.    Do not bring valuables to the hospital.  [facility] is not responsible for any belongings or valuables.  Contacts, dentures or bridgework may not be worn into surgery.  Leave your suitcase in the car.  After surgery it may be brought to your room.  For patients admitted to the hospital, discharge time will be determined by your treatment team.  Patients discharged the day of surgery will not be allowed to drive home.   Name and phone number of your driver:    Special instructions:  "PREPARING FOR SURGERY"  Please read over the following fact sheets that you were given. Pain Booklet, Coughing and Deep Breathing, Blood Transfusion Information and Surgical Site Infection Prevention

## 2015-02-15 NOTE — ED Notes (Signed)
Pt presents to department for evaluation of near syncope, chest pain and diaphoresis. Pt states was in pre-op getting ABG drawn when she began feeling bad. Pt denies LOC. Upon arrival to ED, reports 2/10 chest pressure. Pt is alert and oriented x4.

## 2015-02-15 NOTE — Discharge Instructions (Signed)
It was our pleasure to provide your ER care today - we hope that you feel better.  Rest. Drink adequate fluids.  Follow up with primary care doctor in coming week.  Return to ER if worse, symptoms recur, fainting, chest pain, trouble breathing, fevers, other concern.    Near-Syncope Near-syncope (commonly known as near fainting) is sudden weakness, dizziness, or feeling like you might pass out. During an episode of near-syncope, you may also develop pale skin, have tunnel vision, or feel sick to your stomach (nauseous). Near-syncope may occur when getting up after sitting or while standing for a long time. It is caused by a sudden decrease in blood flow to the brain. This decrease can result from various causes or triggers, most of which are not serious. However, because near-syncope can sometimes be a sign of something serious, a medical evaluation is required. The specific cause is often not determined. HOME CARE INSTRUCTIONS  Monitor your condition for any changes. The following actions may help to alleviate any discomfort you are experiencing:  Have someone stay with you until you feel stable.  Lie down right away and prop your feet up if you start feeling like you might faint. Breathe deeply and steadily. Wait until all the symptoms have passed. Most of these episodes last only a few minutes. You may feel tired for several hours.   Drink enough fluids to keep your urine clear or pale yellow.   If you are taking blood pressure or heart medicine, get up slowly when seated or lying down. Take several minutes to sit and then stand. This can reduce dizziness.  Follow up with your health care provider as directed. SEEK IMMEDIATE MEDICAL CARE IF:   You have a severe headache.   You have unusual pain in the chest, abdomen, or back.   You are bleeding from the mouth or rectum, or you have black or tarry stool.   You have an irregular or very fast heartbeat.   You have repeated  fainting or have seizure-like jerking during an episode.   You faint when sitting or lying down.   You have confusion.   You have difficulty walking.   You have severe weakness.   You have vision problems.  MAKE SURE YOU:   Understand these instructions.  Will watch your condition.  Will get help right away if you are not doing well or get worse. Document Released: 07/30/2005 Document Revised: 08/04/2013 Document Reviewed: 01/02/2013 [facility] Patient Information 2015 [place], [place]. This information is not intended to replace advice given to you by your health care provider. Make sure you discuss any questions you have with your health care provider.   Vasovagal Syncope, Adult Syncope, commonly known as fainting, is a temporary loss of consciousness. It occurs when the blood flow to the brain is reduced. Vasovagal syncope (also called neurocardiogenic syncope) is a fainting spell in which the blood flow to the brain is reduced because of a sudden drop in heart rate and blood pressure. Vasovagal syncope occurs when the brain and the cardiovascular system (blood vessels) do not adequately communicate and respond to each other. This is the most common cause of fainting. It often occurs in response to fear or some other type of emotional or physical stress. The body has a reaction in which the heart starts beating too slowly or the blood vessels expand, reducing blood pressure. This type of fainting spell is generally considered harmless. However, injuries can occur if a person takes a sudden  fall during a fainting spell.  CAUSES  Vasovagal syncope occurs when a person's blood pressure and heart rate decrease suddenly, usually in response to a trigger. Many things and situations can trigger an episode. Some of these include:   Pain.   Fear.   The sight of blood or medical procedures, such as blood being drawn from a vein.   Common activities, such as coughing, swallowing,  stretching, or going to the bathroom.   Emotional stress.   Prolonged standing, especially in a warm environment.   Lack of sleep or rest.   Prolonged lack of food.   Prolonged lack of fluids.   Recent illness.  The use of certain drugs that affect blood pressure, such as cocaine, alcohol, marijuana, inhalants, and opiates.  SYMPTOMS  Before the fainting episode, you may:   Feel dizzy or light headed.   Become pale.  Sense that you are going to faint.   Feel like the room is spinning.   Have tunnel vision, only seeing directly in front of you.   Feel sick to your stomach (nauseous).   See spots or slowly lose vision.   Hear ringing in your ears.   Have a headache.   Feel warm and sweaty.   Feel a sensation of pins and needles. During the fainting spell, you will generally be unconscious for no longer than a couple minutes before waking up and returning to normal. If you get up too quickly before your body can recover, you may faint again. Some twitching or jerky movements may occur during the fainting spell.  DIAGNOSIS  Your caregiver will ask about your symptoms, take a medical history, and perform a physical exam. Various tests may be done to rule out other causes of fainting. These may include blood tests and tests to check the heart, such as electrocardiography, echocardiography, and possibly an electrophysiology study. When other causes have been ruled out, a test may be done to check the body's response to changes in position (tilt table test). TREATMENT  Most cases of vasovagal syncope do not require treatment. Your caregiver may recommend ways to avoid fainting triggers and may provide home strategies for preventing fainting. If you must be exposed to a possible trigger, you can drink additional fluids to help reduce your chances of having an episode of vasovagal syncope. If you have warning signs of an oncoming episode, you can respond by  positioning yourself favorably (lying down). If your fainting spells continue, you may be given medicines to prevent fainting. Some medicines may help make you more resistant to repeated episodes of vasovagal syncope. Special exercises or compression stockings may be recommended. In rare cases, the surgical placement of a pacemaker is considered. HOME CARE INSTRUCTIONS   Learn to identify the warning signs of vasovagal syncope.   Sit or lie down at the first warning sign of a fainting spell. If sitting, put your head down between your legs. If you lie down, swing your legs up in the air to increase blood flow to the brain.   Avoid hot tubs and saunas.  Avoid prolonged standing.  Drink enough fluids to keep your urine clear or pale yellow. Avoid caffeine.  Increase salt in your diet as directed by your caregiver.   If you have to stand for a long time, perform movements such as:   Crossing your legs.   Flexing and stretching your leg muscles.   Squatting.   Moving your legs.   Bending over.   Only  take over-the-counter or prescription medicines as directed by your caregiver. Do not suddenly stop any medicines without asking your caregiver first. SEEK MEDICAL CARE IF:   Your fainting spells continue or happen more frequently in spite of treatment.   You lose consciousness for more than a couple minutes.  You have fainting spells during or after exercising or after being startled.   You have new symptoms that occur with the fainting spells, such as:   Shortness of breath.  Chest pain.   Irregular heartbeat.   You have episodes of twitching or jerky movements that last longer than a few seconds.  You have episodes of twitching or jerky movements without obvious fainting. SEEK IMMEDIATE MEDICAL CARE IF:   You have injuries or bleeding after a fainting spell.   You have episodes of twitching or jerky movements that last longer than 5 minutes.   You  have more than one spell of twitching or jerky movements before returning to consciousness after fainting. MAKE SURE YOU:   Understand these instructions.  Will watch your condition.  Will get help right away if you are not doing well or get worse. Document Released: 07/16/2012 Document Reviewed: 07/16/2012 [facility] Patient Information 2015 [place]. This information is not intended to replace advice given to you by your health care provider. Make sure you discuss any questions you have with your health care provider.

## 2015-02-16 ENCOUNTER — Encounter: Payer: Self-pay | Admitting: Thoracic Surgery (Cardiothoracic Vascular Surgery)

## 2015-02-16 ENCOUNTER — Encounter (HOSPITAL_BASED_OUTPATIENT_CLINIC_OR_DEPARTMENT_OTHER): Payer: Self-pay

## 2015-02-16 ENCOUNTER — Emergency Department (HOSPITAL_BASED_OUTPATIENT_CLINIC_OR_DEPARTMENT_OTHER)
Admission: EM | Admit: 2015-02-16 | Discharge: 2015-02-16 | Disposition: A | Discharge status: Home / Self Care | Payer: Medicaid Other | Attending: Emergency Medicine | Admitting: Emergency Medicine

## 2015-02-16 ENCOUNTER — Emergency Department (HOSPITAL_BASED_OUTPATIENT_CLINIC_OR_DEPARTMENT_OTHER): Payer: Medicaid Other

## 2015-02-16 ENCOUNTER — Other Ambulatory Visit: Payer: Self-pay | Admitting: *Deleted

## 2015-02-16 DIAGNOSIS — R079 Chest pain, unspecified: Secondary | ICD-10-CM | POA: Diagnosis present

## 2015-02-16 DIAGNOSIS — R51 Headache: Secondary | ICD-10-CM | POA: Diagnosis not present

## 2015-02-16 DIAGNOSIS — Z8719 Personal history of other diseases of the digestive system: Secondary | ICD-10-CM | POA: Insufficient documentation

## 2015-02-16 DIAGNOSIS — Z79899 Other long term (current) drug therapy: Secondary | ICD-10-CM | POA: Insufficient documentation

## 2015-02-16 DIAGNOSIS — R519 Headache, unspecified: Secondary | ICD-10-CM

## 2015-02-16 DIAGNOSIS — Z8639 Personal history of other endocrine, nutritional and metabolic disease: Secondary | ICD-10-CM | POA: Diagnosis not present

## 2015-02-16 DIAGNOSIS — F329 Major depressive disorder, single episode, unspecified: Secondary | ICD-10-CM | POA: Diagnosis not present

## 2015-02-16 DIAGNOSIS — F419 Anxiety disorder, unspecified: Secondary | ICD-10-CM | POA: Insufficient documentation

## 2015-02-16 DIAGNOSIS — Z87891 Personal history of nicotine dependence: Secondary | ICD-10-CM | POA: Insufficient documentation

## 2015-02-16 HISTORY — DX: Other nonspecific abnormal finding of lung field: R91.8

## 2015-02-16 LAB — BASIC METABOLIC PANEL
Anion gap: 6 (ref 5–15)
BUN: 13 mg/dL (ref 6–20)
CALCIUM: 9.3 mg/dL (ref 8.9–10.3)
CO2: 29 mmol/L (ref 22–32)
Chloride: 105 mmol/L (ref 101–111)
Creatinine, Ser: 0.8 mg/dL (ref 0.44–1.00)
GFR calc Af Amer: >60 mL/min (ref >60)
GFR calc non Af Amer: >60 mL/min (ref >60)
GLUCOSE: 98 mg/dL (ref 65–99)
Potassium: 4.4 mmol/L (ref 3.5–5.1)
Sodium: 140 mmol/L (ref 135–145)

## 2015-02-16 LAB — CBC
HCT: 38.9 % (ref 36.0–46.0)
Hemoglobin: 13.5 g/dL (ref 12.0–15.0)
MCH: 31.3 pg (ref 26.0–34.0)
MCHC: 34.7 g/dL (ref 30.0–36.0)
MCV: 90 fL (ref 78.0–100.0)
PLATELETS: 325 10*3/uL (ref 150–400)
RBC: 4.32 MIL/uL (ref 3.87–5.11)
RDW: 12.3 % (ref 11.5–15.5)
WBC: 4.3 10*3/uL (ref 4.0–10.5)

## 2015-02-16 LAB — TROPONIN I: Troponin I: <0.03 ng/mL (ref <0.031)

## 2015-02-16 LAB — D-DIMER, QUANTITATIVE (NOT AT ARMC): D-Dimer, Quant: <0.27 ug/mL-FEU (ref 0.00–0.48)

## 2015-02-16 MED ORDER — DIPHENHYDRAMINE HCL 50 MG/ML IJ SOLN: 12.5000 mg | Freq: Once | INTRAMUSCULAR | Status: DC

## 2015-02-16 MED ORDER — ONDANSETRON HCL 4 MG/2ML IJ SOLN
4.0000 mg | Freq: Once | INTRAMUSCULAR | Status: AC
  Administered 2015-02-16: 4 mg via INTRAVENOUS
  Filled 2015-02-16: qty 2

## 2015-02-16 MED ORDER — METOCLOPRAMIDE HCL 5 MG/ML IJ SOLN: 10.0000 mg | Freq: Once | INTRAMUSCULAR | Status: DC

## 2015-02-16 NOTE — Discharge Instructions (Signed)
Please read and follow all provided instructions.  Your diagnoses today include:  1. Acute nonintractable headache, unspecified headache type   2. Chest pain, unspecified chest pain type    Tests performed today include:  An EKG of your heart  A chest x-ray   Cardiac enzymes - a blood test for heart muscle damage  Blood counts and electrolytes  D-dimer - test for blood clot that does not suggest you have a blood clot  Vital signs. See below for your results today.   Medications prescribed:   None  Take any prescribed medications only as directed.  Follow-up instructions: Please follow-up with your primary care provider as soon as you can for further evaluation of your symptoms.   Return instructions:  SEEK IMMEDIATE MEDICAL ATTENTION IF:  You have severe chest pain, especially if the pain is crushing or pressure-like and spreads to the arms, back, neck, or jaw, or if you have sweating, nausea (feeling sick to your stomach), or shortness of breath. THIS IS AN EMERGENCY. Don't wait to see if the pain will go away. Get medical help at once. Call 911 or [phone] (operator). DO NOT drive yourself to the hospital.   Your chest pain gets worse and does not go away with rest.   You have an attack of chest pain lasting longer than usual, despite rest and treatment with the medications your caregiver has prescribed.   You wake from sleep with chest pain or shortness of breath.  You feel dizzy or faint.  You have chest pain not typical of your usual pain for which you originally saw your caregiver.   You have any other emergent concerns regarding your health.  Additional Information: Chest pain comes from many different causes. Your caregiver has diagnosed you as having chest pain that is not specific for one problem, but does not require admission.  You are at low risk for an acute heart condition or other serious illness.   Your vital signs today were: BP 137/81 mmHg   Pulse 97    Temp(Src) 98.2 F (36.8 C) (Oral)   Resp 18   Ht 5\' 5"  (1.651 m)   Wt 150 lb (68.04 kg)   BMI 24.96 kg/m2   SpO2 100% If your blood pressure (BP) was elevated above 135/85 this visit, please have this repeated by your doctor within one month. --------------

## 2015-02-16 NOTE — ED Provider Notes (Signed)
Medical screening examination/treatment/procedure(s) were conducted as a shared visit with non-physician practitioner(s) and myself.  I personally evaluated the patient during the encounter.   EKG Interpretation None      EKG- normal sinus rhythm, rate 91, normal axis, normal intervals, no ST or T abnormalities.  64 yo female with multiple complaints including right parietal headache and chest pressure.  She is a very difficult historian and has a pan positive review of systems.  On exam, well appearing, nontoxic, not distressed, normal respiratory effort, normal perfusion, heart sounds normal with regular rate and rhythm, lungs clear to auscultation bilaterally, cranial nerves II through XII intact, normal coordination, normal strength in all extremities, normal sensation in all extremities, mild parietal scalp tenderness on right, no temporal tenderness. Her lab and EKG and chest x-ray workup have been completely normal. She has no physical exam findings to suggest any acute pathology. Her troponin was negative and was also negative yesterday when she presented to a different emergency department for similar symptoms. I don't think she needs further cardiac workup. Her symptoms and exam are unlikely to represent PE and her d-dimer is negative. She was given Zofran, but declined other treatment for her headache. Plan DC with follow-up.  Clinical Impression: 1. Acute nonintractable headache, unspecified headache type   2. Chest pain, unspecified chest pain type       Serita Grit, MD 02/17/15 [phone]

## 2015-02-16 NOTE — Progress Notes (Signed)
Lab was only able to get blood gas yesterday, due to feels tightness in her chest, she was taken to the ED. Labs will need to be  Drawn the day of surgery.

## 2015-02-16 NOTE — ED Provider Notes (Signed)
CSN: [id number]     Arrival date & time 02/16/15  1201 History   First MD Initiated Contact with Patient 02/16/15 1229     Chief Complaint  Patient presents with  . Chest Pain     (Consider location/radiation/quality/duration/timing/severity/associated sxs/prior Treatment) HPI Comments: Patient currently ongoing workup for right upper lobe lung nodule, concerning PET scan, scheduled for VATS procedure -- presents with multiple complaints. Patient complains of ongoing chest pain and tightness, shortness of breath with activity which has been ongoing for several weeks but has been worse as of this morning. She describes a tightness in her chest. Patient denies risk factors for pulmonary embolism including: unilateral leg swelling, history of DVT/PE/other blood clots, use of estrogens, recent immobilizations, recent surgery, recent travel (>4hr segment), hemoptysis. She also describes a headache which has been ongoing for 2 weeks which starts in her right neck and radiates up to the right side of her head 'like a blood vessel is irritated'. She complains of having blurry vision. No nausea or vomiting. Patient denies signs of stroke including: facial droop, slurred speech, aphasia, weakness/numbness in extremities, imbalance/trouble walking. She describes a generalized weakness all over her body. Patient was seen in emergency department yesterday for a syncopal episode which occurred while getting blood drawn. Patient had a CTPA performed one month ago which was negative for PE.     The history is provided by the patient and medical records.    Past Medical History  Diagnosis Date  . Anxiety   . Hyperlipidemia   . Diverticulosis   . IBS (irritable bowel syndrome)   . Depression   . Diverticulitis   . Headache   . Lung mass    Past Surgical History  Procedure Laterality Date  . Abdominal hysterectomy    . Tubal ligation    . Hemorrhoid surgery    . Tumor removal      Left thigh     Family History  Problem Relation Age of Onset  . Cancer Mother     renal cell carcinoma  . Diabetes Mother   . Heart disease Father     first MI in 85s  . Cancer Sister     cervical cancer  . Heart disease Brother     first MI in 62s  . Alcohol abuse Sister   . Fibromyalgia Sister   . Arthritis Sister     osteoarthritis  . Irritable bowel syndrome Sister   . Leukemia      grandson   History  Substance Use Topics  . Smoking status: Former Smoker -- 0.80 packs/day for 20 years    Types: Cigarettes    Quit date: 08/13/1985  . Smokeless tobacco: Never Used  . Alcohol Use: Yes     Comment: rare   OB History    No data available     Review of Systems  All other systems reviewed and are negative.     Allergies  Ciprofloxacin; Peanut butter flavor; and Shellfish allergy  Home Medications   Prior to Admission medications   Medication Sig Start Date End Date Taking? Authorizing Provider  ALOE VERA PO Take 1 Dose by mouth 2 (two) times daily. 3oz twice daily    Historical Provider, MD  ALPRAZolam (XANAX) 0.25 MG tablet TAKE 1 TABLET BY MOUTH AT BEDTIME AS NEEDED FOR ANXIETY OR SLEEP 12/31/14   Hali Marry, MD   BP 137/81 mmHg  Pulse 97  Temp(Src) 98.2 F (36.8 C) (Oral)  Resp 18  Ht 5\' 5"  (1.651 m)  Wt 150 lb (68.04 kg)  BMI 24.96 kg/m2  SpO2 100% Physical Exam  Constitutional: She is oriented to person, place, and time. She appears well-developed and well-nourished.  HENT:  Head: Normocephalic and atraumatic.  Right Ear: Tympanic membrane, external ear and ear canal normal.  Left Ear: Tympanic membrane, external ear and ear canal normal.  Nose: Nose normal.  Mouth/Throat: Uvula is midline, oropharynx is clear and moist and mucous membranes are normal.  Eyes: Conjunctivae, EOM and lids are normal. Pupils are equal, round, and reactive to light. Right eye exhibits no discharge. Left eye exhibits no discharge. Right eye exhibits no nystagmus. Left eye  exhibits no nystagmus.  Neck: Normal range of motion. Neck supple.  Cardiovascular: Normal rate, regular rhythm and normal heart sounds.   No murmur heard. Pulmonary/Chest: Effort normal and breath sounds normal. No respiratory distress. She has no wheezes. She has no rales.  Abdominal: Soft. There is no tenderness. There is no rebound and no guarding.  Musculoskeletal: She exhibits no edema or tenderness.       Cervical back: She exhibits normal range of motion, no tenderness and no bony tenderness.  Neurological: She is alert and oriented to person, place, and time. She has normal strength and normal reflexes. No cranial nerve deficit or sensory deficit. She displays a negative Romberg sign. Coordination and gait normal. GCS eye subscore is 4. GCS verbal subscore is 5. GCS motor subscore is 6.  Skin: Skin is warm and dry.  Psychiatric: She has a normal mood and affect.  Nursing note and vitals reviewed.   ED Course  Procedures (including critical care time) Labs Review Labs Reviewed  CBC  BASIC METABOLIC PANEL  TROPONIN I  D-DIMER, QUANTITATIVE (NOT AT [facility])    Imaging Review Dg Chest 2 View  02/15/2015   CLINICAL DATA:  64 year old female. Left lung VATS. Subsequent encounter.  EXAM: CHEST  2 VIEW  COMPARISON:  PET-CT 01/14/2015 and earlier.  FINDINGS: Lung volumes appear stable since May radiographs. No pneumothorax, pulmonary edema, pleural effusion or acute pulmonary opacity. Mediastinal contours remain normal. Visualized tracheal air column is within normal limits. The medial left apical lung nodule is poorly visualized radiographically. Mild scoliosis. Stable visualized osseous structures.  IMPRESSION: No new cardiopulmonary abnormality.   Electronically Signed   By: Genevie Ann M.D.   On: 02/15/2015 14:10     EKG Interpretation None       ED ECG REPORT   Date: 02/16/2015  Rate: 91  Rhythm: normal sinus rhythm  QRS Axis: normal  Intervals: normal  ST/T Wave abnormalities:  normal  Conduction Disutrbances:none  Narrative Interpretation:   Old EKG Reviewed: unchanged  I have personally reviewed the EKG tracing and agree with the computerized printout as noted.  Patient seen and examined. Work-up initiated. Medications ordered. EKG reviewed. D/w Dr. Doy Mince who will see.   Vital signs reviewed and are as follows: BP 137/81 mmHg  Pulse 97  Temp(Src) 98.2 F (36.8 C) (Oral)  Resp 18  Ht 5\' 5"  (1.651 m)  Wt 150 lb (68.04 kg)  BMI 24.96 kg/m2  SpO2 100%  3:48 PM Patient seen earlier by Dr. Doy Mince.   Patient asked several times about her CXR and asked if area could be gone if it is no longer showing up on x-ray. I told her that CT and PET are more accurate tests and should be considered over plain film imaging, but ultimately she needs to talk to  her surgeon to have all of her questions and concerns completely answered.   Patient urged to return with worsening symptoms or other concerns. Patient verbalized understanding and agrees with plan.    MDM   Final diagnoses:  Acute nonintractable headache, unspecified headache type  Chest pain, unspecified chest pain type   Patient with CP. D-dimer neg for PE. EKG shows NSR. Troponin neg. Vitals are normal and patient has normal oxygen saturation.   Patient with atypical type HA/numbess without high-risk features of headache including: sudden onset/thunderclap HA, no similar headache in past, altered mental status, accompanying seizure, headache with exertion, history of immunocompromise, fever, use of anticoagulation, family history of spontaneous SAH, concomitant drug use, toxic exposure.   Patient has a normal complete neurological exam, normal vital signs, normal level of consciousness, no signs of meningismus, is well-appearing/non-toxic appearing, no signs of trauma, no pain over the temporal arteries. I have low suspicion for a vascular etiology including vertebral/carotid dissection. Pain is mild and she  is comfortable, normal ROM of neck.   Imaging with CT/MRI not indicated given history and physical exam findings.   No dangerous or life-threatening conditions suspected or identified by history, physical exam, and by work-up. No indications for hospitalization identified.      Carlisle Cater, PA-C 02/16/15 [place], MD 02/17/15 [phone]

## 2015-02-16 NOTE — Progress Notes (Unsigned)
Patient ID: Jamie [name], female   DOB: 1950-09-18, 64 y.o.   MRN: [id number]  Mrs Cabal called our office today saying that a chest xray done yesterday showed her lung nodule was gone.  She has been in the ED twice in the last 2 days with chest pain. Her workup has been unrevealing.  I am not surprised that the nodule doesn't show up on a plain film as it is not very large.  I do not think proceeding with surgery is appropriate at this time given her ongoing pain issues. We will cancel for now. Once the pain issue under control we will reschedule her surgery.  Revonda Standard Roxan Hockey, MD [facility] [phone]

## 2015-02-16 NOTE — ED Notes (Signed)
Discussed ordered medication and pt expresses concerns related to Benadryl making her sleepy and requesting Zofran for nausea.  Pt also insisting on gingerale.  Provider informed and new orders received.

## 2015-02-16 NOTE — ED Notes (Signed)
Pt states she had HA yesterday-wne tin for pre-op tests for left lung mass-near syncopal episode after blood draw from an artery-was taken to ED

## 2015-02-16 NOTE — ED Notes (Signed)
MD at bedside. 

## 2015-02-16 NOTE — ED Notes (Signed)
Family at bedside. 

## 2015-02-16 NOTE — ED Notes (Signed)
Josh, PA-C at bedside

## 2015-02-17 ENCOUNTER — Inpatient Hospital Stay (HOSPITAL_COMMUNITY)
Admission: RE | Admit: 2015-02-17 | Payer: No Typology Code available for payment source | Source: Ambulatory Visit | Admitting: Thoracic Surgery (Cardiothoracic Vascular Surgery)

## 2015-02-17 ENCOUNTER — Telehealth: Payer: Self-pay | Admitting: *Deleted

## 2015-02-17 ENCOUNTER — Encounter (HOSPITAL_COMMUNITY): Admission: RE | Payer: Self-pay | Source: Ambulatory Visit

## 2015-02-17 DIAGNOSIS — I700 Atherosclerosis of aorta: Secondary | ICD-10-CM

## 2015-02-17 SURGERY — VIDEO ASSISTED THORACOSCOPY (VATS)/WEDGE RESECTION
Anesthesia: General | Site: Chest

## 2015-02-17 NOTE — Telephone Encounter (Signed)
Cardiology referral placed

## 2015-03-07 NOTE — Progress Notes (Signed)
      HPI: FU dyspnea. Echocardiogram in December of 2011 showed an ejection fraction of 55-60%. There was grade 1 diastolic dysfunction. Myoview in Dec 2011 normal with EF 73. Cardiac CT in December of 2011 showed a calcium score of 0. There was no coronary disease. Venous Dopplers in November of 2013 showed no DVT. 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 Recently found to have a left upper lobe lung mass on chest CT which will require resection. In early July patient was having preoperative blood work drawn. She complained of chest pain and also had a vagal episode. Patient was sent to the emergency room and had a normal electrocardiogram and normal troponin. Patient again seen in the emergency room the following day with chest pain. D-dimer negative. Cardiology asked to evaluate preoperatively prior to lung resection. Patient has had intermittent chest pain for years. The pain is substernal radiating to her back. Typically lasts 5-10 minutes by her report. Not exertional. She also complains of dyspnea on exertion but no orthopnea, PND or pedal edema. She describes her heart pounding with exercise. No further syncopal episodes.  Current Outpatient Prescriptions  Medication Sig Dispense Refill  . ALOE VERA PO Take 1 Dose by mouth 2 (two) times daily. 3oz twice daily    . ALPRAZolam (XANAX) 0.25 MG tablet TAKE 1 TABLET BY MOUTH AT BEDTIME AS NEEDED FOR ANXIETY OR SLEEP 30 tablet 0   No current facility-administered medications for this visit.     Past Medical History  Diagnosis Date  . Anxiety   . Hyperlipidemia   . Diverticulosis   . IBS (irritable bowel syndrome)   . Depression   . Diverticulitis   . Headache   . Lung mass     Past Surgical History  Procedure Laterality Date  . Abdominal hysterectomy    . Tubal ligation    . Hemorrhoid surgery    . Tumor removal      Left thigh     History   Social History  .  Marital Status: Divorced    Spouse Name: N/A  . Number of Children: 3  . Years of Education: N/A   Occupational History  . Student    Social History Main Topics  . Smoking status: Former Smoker -- 0.80 packs/day for 20 years    Types: Cigarettes    Quit date: 08/13/1985  . Smokeless tobacco: Never Used  . Alcohol Use: Yes     Comment: rare  . Drug Use: No  . Sexual Activity: Not on file   Other Topics Concern  . Not on file   Social History Narrative   Lives with and grandchild (she is foster parent for grand child)Completed school full time.   Caffeine use; occasional   Works in Personal assistant, Economist.   In process of divorce.            ROS: no fevers or chills, productive cough, hemoptysis, dysphasia, odynophagia, melena, hematochezia, dysuria, hematuria, rash, seizure activity, orthopnea, PND, pedal edema, claudication. Remaining systems are negative.  Physical Exam: Well-developed well-nourished in no acute distress.  Skin is warm and dry.  HEENT is normal.  Neck is supple.  Chest is clear to auscultation with normal expansion.  Cardiovascular exam is regular rate and rhythm.  Abdominal exam nontender or distended. No masses palpated. Extremities show no edema. neuro grossly intact  ECG 02/16/2015-sinus rhythm with no ST changes.

## 2015-03-08 ENCOUNTER — Encounter: Payer: Self-pay | Admitting: Cardiology

## 2015-03-08 ENCOUNTER — Encounter: Payer: Self-pay | Admitting: *Deleted

## 2015-03-08 ENCOUNTER — Ambulatory Visit (INDEPENDENT_AMBULATORY_CARE_PROVIDER_SITE_OTHER): Payer: Medicaid Other | Admitting: Cardiology

## 2015-03-08 VITALS — BP 118/80 | HR 78 | Ht 65.0 in | Wt 149.0 lb

## 2015-03-08 DIAGNOSIS — T671XXD Heat syncope, subsequent encounter: Secondary | ICD-10-CM

## 2015-03-08 DIAGNOSIS — E785 Hyperlipidemia, unspecified: Secondary | ICD-10-CM

## 2015-03-08 DIAGNOSIS — Z01810 Encounter for preprocedural cardiovascular examination: Secondary | ICD-10-CM | POA: Insufficient documentation

## 2015-03-08 DIAGNOSIS — R55 Syncope and collapse: Secondary | ICD-10-CM

## 2015-03-08 DIAGNOSIS — R0600 Dyspnea, unspecified: Secondary | ICD-10-CM

## 2015-03-08 DIAGNOSIS — R072 Precordial pain: Secondary | ICD-10-CM

## 2015-03-08 DIAGNOSIS — R0602 Shortness of breath: Secondary | ICD-10-CM | POA: Diagnosis not present

## 2015-03-08 DIAGNOSIS — E079 Disorder of thyroid, unspecified: Secondary | ICD-10-CM | POA: Insufficient documentation

## 2015-03-08 HISTORY — DX: Syncope and collapse: R55

## 2015-03-08 NOTE — Assessment & Plan Note (Signed)
Symptoms atypical. She is scheduled for resection of a lung mass. We will plan an exercise treadmill for risk stratification. If normal she may proceed.

## 2015-03-08 NOTE — Assessment & Plan Note (Signed)
Patient scheduled to have resection of lung mass. She had a recent syncopal episode that sounds vaguely mediated as it occurred with blood draw. She also has chest pain and dyspnea. Plan exercise treadmill for risk stratification and echocardiogram to assess LV function. If normal she may proceed without further evaluation.

## 2015-03-08 NOTE — Assessment & Plan Note (Signed)
Patient does not appear to be volume overloaded on examination. Plan echocardiogram to assess LV function.

## 2015-03-08 NOTE — Assessment & Plan Note (Signed)
Likely vagal in etiology based on description and relationship to blood draw. Echocardiogram to assess LV function.

## 2015-03-08 NOTE — Assessment & Plan Note (Signed)
Management per primary care. 

## 2015-03-08 NOTE — Patient Instructions (Signed)
Your physician recommends that you schedule a follow-up appointment in: AS NEEDED PENDING TEST RESULTS  Your physician has requested that you have an echocardiogram. Echocardiography is a painless test that uses sound waves to create images of your heart. It provides your doctor with information about the size and shape of your heart and how well your heart's chambers and valves are working. This procedure takes approximately one hour. There are no restrictions for this procedure.   Your physician has requested that you have an exercise tolerance test. For further information please visit [email or web address]. Please also follow instruction sheet, as given.    Exercise Stress Electrocardiogram An exercise stress electrocardiogram is a test to check how blood flows to your heart. It is done to find areas of poor blood flow. You will need to walk on a treadmill for this test. The electrocardiogram will record your heartbeat when you are at rest and when you are exercising. BEFORE THE PROCEDURE  Do not have drinks with caffeine or foods with caffeine for 24 hours before the test, or as told by your doctor. This includes coffee, tea (even decaf tea), sodas, chocolate, and cocoa.  Follow your doctor's instructions about eating and drinking before the test.  Ask your doctor what medicines you should or should not take before the test. Take your medicines with water unless told by your doctor not to.  If you use an inhaler, bring it with you to the test.  Bring a snack to eat after the test.  Do not  smoke for 4 hours before the test.  Do not put lotions, powders, creams, or oils on your chest before the test.  Wear comfortable shoes and clothing. PROCEDURE  You will have patches put on your chest. Small areas of your chest may need to be shaved. Wires will be connected to the patches.  Your heart rate will be watched while you are resting and while you are exercising.  You will walk on the  treadmill. The treadmill will slowly get faster to raise your heart rate.  The test will take about 1-2 hours. AFTER THE PROCEDURE  Your heart rate and blood pressure will be watched after the test.  You may return to your normal diet, activities, and medicines or as told by your doctor. Document Released: 01/16/2008 Document Revised: 12/14/2013 Document Reviewed: 04/06/2013 [facility] Patient Information 2015 [place], [place]. This information is not intended to replace advice given to you by your health care provider. Make sure you discuss any questions you have with your health care provider.

## 2015-04-04 ENCOUNTER — Telehealth: Payer: Self-pay

## 2015-04-04 NOTE — Telephone Encounter (Signed)
LEFT MESSAGE ON PT VOICEMAIL FOR PT TO CALL ME BACK .BILLING DEPT NEEDS TO KNOW IF PATIENT HAS ANY INSURANCE .-PT HAS UP COMING ECHO DAJ

## 2015-04-06 ENCOUNTER — Ambulatory Visit: Payer: Self-pay | Admitting: Physician Assistant

## 2015-04-07 ENCOUNTER — Telehealth: Payer: Self-pay

## 2015-04-07 NOTE — Telephone Encounter (Signed)
Spoke with patient this morning ,she currently has no insurance she is being assisted by [employer] cone financial assistance services

## 2015-04-11 ENCOUNTER — Ambulatory Visit (INDEPENDENT_AMBULATORY_CARE_PROVIDER_SITE_OTHER): Payer: Medicaid Other

## 2015-04-11 ENCOUNTER — Other Ambulatory Visit: Payer: Self-pay

## 2015-04-11 ENCOUNTER — Encounter: Payer: No Typology Code available for payment source | Admitting: Physician Assistant

## 2015-04-11 ENCOUNTER — Encounter: Payer: Self-pay | Admitting: Physician Assistant

## 2015-04-11 ENCOUNTER — Ambulatory Visit (HOSPITAL_COMMUNITY): Payer: Medicaid Other | Attending: Cardiology

## 2015-04-11 DIAGNOSIS — Z87891 Personal history of nicotine dependence: Secondary | ICD-10-CM | POA: Diagnosis not present

## 2015-04-11 DIAGNOSIS — I371 Nonrheumatic pulmonary valve insufficiency: Secondary | ICD-10-CM | POA: Diagnosis not present

## 2015-04-11 DIAGNOSIS — E785 Hyperlipidemia, unspecified: Secondary | ICD-10-CM | POA: Diagnosis not present

## 2015-04-11 DIAGNOSIS — R0600 Dyspnea, unspecified: Secondary | ICD-10-CM | POA: Diagnosis not present

## 2015-04-11 DIAGNOSIS — I340 Nonrheumatic mitral (valve) insufficiency: Secondary | ICD-10-CM | POA: Insufficient documentation

## 2015-04-11 DIAGNOSIS — I5189 Other ill-defined heart diseases: Secondary | ICD-10-CM | POA: Diagnosis not present

## 2015-04-11 LAB — EXERCISE TOLERANCE TEST
CHL CUP MPHR: 158 {beats}/min
CHL CUP RESTING HR STRESS: 82 {beats}/min
Estimated workload: 8.5 METS
Exercise duration (min): 7 min
Peak HR: 157 {beats}/min
Percent HR: 99 %
RPE: 17

## 2015-04-12 ENCOUNTER — Telehealth: Payer: Self-pay | Admitting: Cardiology

## 2015-04-12 NOTE — Telephone Encounter (Signed)
Pt called in stating that she received a call from our office and she believes it was about her results to her monitor and stress test she had yesterday. Please call  Thanks

## 2015-04-12 NOTE — Telephone Encounter (Signed)
Patient given the results of echo and stress test as results being "ok" with both

## 2015-04-13 ENCOUNTER — Ambulatory Visit: Payer: No Typology Code available for payment source | Admitting: Cardiology

## 2015-04-20 ENCOUNTER — Ambulatory Visit (INDEPENDENT_AMBULATORY_CARE_PROVIDER_SITE_OTHER): Payer: Medicaid Other | Admitting: Physician Assistant

## 2015-04-20 ENCOUNTER — Ambulatory Visit: Payer: Self-pay | Admitting: Physician Assistant

## 2015-04-20 ENCOUNTER — Encounter: Payer: Self-pay | Admitting: Physician Assistant

## 2015-04-20 VITALS — BP 117/72 | HR 84 | Ht 65.0 in | Wt 154.0 lb

## 2015-04-20 DIAGNOSIS — G43101 Migraine with aura, not intractable, with status migrainosus: Secondary | ICD-10-CM

## 2015-04-20 DIAGNOSIS — R911 Solitary pulmonary nodule: Secondary | ICD-10-CM | POA: Insufficient documentation

## 2015-04-20 DIAGNOSIS — Z13220 Encounter for screening for lipoid disorders: Secondary | ICD-10-CM

## 2015-04-20 DIAGNOSIS — F430 Acute stress reaction: Secondary | ICD-10-CM

## 2015-04-20 DIAGNOSIS — Z79899 Other long term (current) drug therapy: Secondary | ICD-10-CM | POA: Diagnosis not present

## 2015-04-20 DIAGNOSIS — Z131 Encounter for screening for diabetes mellitus: Secondary | ICD-10-CM

## 2015-04-20 DIAGNOSIS — R918 Other nonspecific abnormal finding of lung field: Secondary | ICD-10-CM | POA: Insufficient documentation

## 2015-04-20 MED ORDER — SUMATRIPTAN SUCCINATE 100 MG PO TABS: 100.0000 mg | ORAL_TABLET | ORAL | Status: DC | PRN

## 2015-04-20 MED ORDER — ALPRAZOLAM 0.25 MG PO TABS: ORAL_TABLET | ORAL | Status: DC

## 2015-04-20 MED ORDER — KETOROLAC TROMETHAMINE 30 MG/ML IJ SOLN
30.0000 mg | Freq: Once | INTRAMUSCULAR | Status: AC
  Administered 2015-04-20: 30 mg via INTRAMUSCULAR

## 2015-04-20 MED ORDER — CITALOPRAM HYDROBROMIDE 10 MG PO TABS: 10.0000 mg | ORAL_TABLET | Freq: Every day | ORAL | Status: DC

## 2015-04-20 NOTE — Progress Notes (Signed)
   Subjective:    Patient ID: Jamie [name], female    DOB: 04/17/1951, 64 y.o.   MRN: [id number]  HPI  Pt presents to the clinic with multiple concerns.   Pt was recently dx with lung mass. She has seen Dr. Roxan Hockey and suggest lung resection. She wants 2nd opinion.   Pt is also very stress. Her grandson was taken from her care and dx of lung mass. She can't sleep. She is getting more and more HA's. Located in temporal and occipital location longest lasting 4 days. Having some tingling sensation more on right than left. Some blurry vision with spots before HA. No photo or phonophobia. Some nausea. No vomiting. Tried heating pad and advil. Pain 7/10. Trigger seems to be stress.        Review of Systems  All other systems reviewed and are negative.      Objective:   Physical Exam  Constitutional: She appears well-developed and well-nourished.  HENT:  Head: Normocephalic and atraumatic.  Eyes: Conjunctivae and EOM are normal. Pupils are equal, round, and reactive to light.  Cardiovascular: Normal rate, regular rhythm and normal heart sounds.   Pulmonary/Chest: Effort normal and breath sounds normal.  Psychiatric:  Tearful.           Assessment & Plan:  Migraine- toradol 30mg  IM today. imitrex given for rescue. I feel like migraine could be stress induced.   Acute reaction to stress-xanax as needed. celexa 10mg  started. Discussed SE's. Follow up in 4-6 weeks.   Lung mass- managed by oncology Dr. Roxan Hockey. Would like second opinion by [facility], Dr. Saverio Danker.  64 year old woman with a left upper lobe nodule that has enlarged since 2014 and has malignant range FDG uptake on PET/CT. Despite her relatively light smoking history, this is concerning for a primary bronchogenic carcinoma. It has to be considered that unless it can be proven otherwise.

## 2015-04-21 ENCOUNTER — Telehealth: Payer: Self-pay

## 2015-04-21 DIAGNOSIS — M8580 Other specified disorders of bone density and structure, unspecified site: Secondary | ICD-10-CM

## 2015-04-21 DIAGNOSIS — R5383 Other fatigue: Secondary | ICD-10-CM

## 2015-04-21 NOTE — Telephone Encounter (Signed)
Russell would like to also have vitamin B 12 and vitamin D checked. Is it ok to add on?

## 2015-04-21 NOTE — Telephone Encounter (Signed)
Ok to order 

## 2015-04-27 ENCOUNTER — Ambulatory Visit (INDEPENDENT_AMBULATORY_CARE_PROVIDER_SITE_OTHER): Payer: Medicaid Other | Admitting: Physician Assistant

## 2015-04-27 ENCOUNTER — Telehealth: Payer: Self-pay

## 2015-04-27 ENCOUNTER — Encounter: Payer: Self-pay | Admitting: Physician Assistant

## 2015-04-27 VITALS — BP 119/68 | HR 94 | Ht 65.0 in | Wt 151.0 lb

## 2015-04-27 DIAGNOSIS — R1032 Left lower quadrant pain: Secondary | ICD-10-CM

## 2015-04-27 DIAGNOSIS — R1031 Right lower quadrant pain: Secondary | ICD-10-CM | POA: Diagnosis not present

## 2015-04-27 DIAGNOSIS — R197 Diarrhea, unspecified: Secondary | ICD-10-CM

## 2015-04-27 DIAGNOSIS — R110 Nausea: Secondary | ICD-10-CM | POA: Diagnosis not present

## 2015-04-27 MED ORDER — AMOXICILLIN-POT CLAVULANATE 875-125 MG PO TABS: 1.0000 | ORAL_TABLET | Freq: Two times a day (BID) | ORAL | Status: DC

## 2015-04-27 MED ORDER — ONDANSETRON HCL 4 MG PO TABS: 4.0000 mg | ORAL_TABLET | Freq: Three times a day (TID) | ORAL | Status: DC | PRN

## 2015-04-27 NOTE — Telephone Encounter (Signed)
Patient complains of diarrhea, pain in side and back. She states she has a flare up of diverticulitis.

## 2015-04-27 NOTE — Telephone Encounter (Signed)
Patient has been informed she is coming in today @2 :30. Rhonda Cunningham,CMA

## 2015-04-27 NOTE — Progress Notes (Signed)
   Subjective:    Patient ID: Jamie [name], female    DOB: 12-06-1950, 64 y.o.   MRN: [id number]  HPI  Patient is a 64 year old female who presents to the clinic with bilateral lower abdominal cramping that radiates into her lower back. Patient started noticing symptoms approximately 4 days ago. 3 days ago diarrhea started. She denies any blood in stool. She denies any pain with urination. She denies any fever or chills. Yesterday she rated the pain 10 out of 10. Today the pain is a little better. She has not taken anything to make better. She has a history of diverticulitis with hospitalization. She has a lot of nausea but no vomiting. She has not started new medication prescribed Celexa.    Review of Systems  All other systems reviewed and are negative.      Objective:   Physical Exam  Constitutional: She is oriented to person, place, and time. She appears well-developed and well-nourished.  HENT:  Head: Normocephalic and atraumatic.  Cardiovascular: Normal rate, regular rhythm and normal heart sounds.   Pulmonary/Chest: Effort normal and breath sounds normal.  No CVA tenderness.  Abdominal: Soft. Bowel sounds are normal. She exhibits no mass.  Negative for rebound in either lower quadrant. There is some guarding in bilateral lower abdominal quadrants.  Musculoskeletal:  I did not palpate any lumbar tenderness or paraspinous tenderness.  Neurological: She is alert and oriented to person, place, and time.  Skin: Skin is dry.  Psychiatric: She has a normal mood and affect. Her behavior is normal.          Assessment & Plan:  Bilateral lower abdominal pain- I'm going to treat empirically for diverticulitis due to history and symptoms. Patient was started on Augmentin for 10 days. I would also like patient to start probiotic to help with any GI symptoms from anabiotic. Patient was given Zofran for nausea. Patient instructed to hold off on starting Celexa until her symptoms improve.

## 2015-04-27 NOTE — Telephone Encounter (Signed)
I have some appts this afternoon. Let's get her in.   Could be celexa medication side effect. That was just started with diarrhea.   Need better exam of pain.

## 2015-04-27 NOTE — Patient Instructions (Signed)

## 2015-05-02 ENCOUNTER — Telehealth: Payer: Self-pay

## 2015-05-02 MED ORDER — ESOMEPRAZOLE MAGNESIUM 40 MG PO CPDR: 40.0000 mg | DELAYED_RELEASE_CAPSULE | Freq: Every day | ORAL | Status: DC

## 2015-05-02 NOTE — Addendum Note (Signed)
Addended by: Narda Rutherford on: 05/02/2015 11:52 AM   Modules accepted: Orders

## 2015-05-02 NOTE — Telephone Encounter (Signed)
Ok to send nexium 40mg  1 po qd #30 RF3

## 2015-05-02 NOTE — Telephone Encounter (Signed)
Patient left a message stating since she has insurance she would like Nexium for her heartburn sent in. Please advise.

## 2015-05-02 NOTE — Telephone Encounter (Signed)
Medication sent.  Patient aware  

## 2015-05-09 ENCOUNTER — Encounter: Payer: Self-pay | Admitting: Cardiothoracic Surgery

## 2015-05-11 LAB — COMPLETE METABOLIC PANEL WITH GFR
ALT: 14 U/L (ref 6–29)
AST: 19 U/L (ref 10–35)
Albumin: 4 g/dL (ref 3.6–5.1)
Alkaline Phosphatase: 53 U/L (ref 33–130)
BILIRUBIN TOTAL: 0.6 mg/dL (ref 0.2–1.2)
BUN: 13 mg/dL (ref 7–25)
CO2: 30 mmol/L (ref 20–31)
Calcium: 9.5 mg/dL (ref 8.6–10.4)
Chloride: 104 mmol/L (ref 98–110)
Creat: 0.82 mg/dL (ref 0.50–0.99)
GFR, Est African American: 89 mL/min (ref >=60)
GFR, Est Non African American: 77 mL/min (ref >=60)
GLUCOSE: 87 mg/dL (ref 65–99)
POTASSIUM: 4.5 mmol/L (ref 3.5–5.3)
SODIUM: 137 mmol/L (ref 135–146)
TOTAL PROTEIN: 6.8 g/dL (ref 6.1–8.1)

## 2015-05-11 LAB — LIPID PANEL
CHOL/HDL RATIO: 4.1 ratio (ref <=5.0)
Cholesterol: 240 mg/dL — ABNORMAL HIGH (ref 125–200)
HDL: 58 mg/dL (ref >=46)
LDL CALC: 157 mg/dL — AB (ref <130)
Triglycerides: 127 mg/dL (ref <150)
VLDL: 25 mg/dL (ref <30)

## 2015-05-11 LAB — VITAMIN D 25 HYDROXY (VIT D DEFICIENCY, FRACTURES): VIT D 25 HYDROXY: 24 ng/mL — AB (ref 30–100)

## 2015-05-11 LAB — VITAMIN B12: Vitamin B-12: 160 pg/mL — ABNORMAL LOW (ref 211–911)

## 2015-05-19 ENCOUNTER — Telehealth: Payer: Self-pay

## 2015-05-19 NOTE — Telephone Encounter (Signed)
Patient called for a prescription for vitamin B 12 injections. Please advise.

## 2015-05-22 NOTE — Telephone Encounter (Signed)
Would she like to come into office or send over for her to give herself. Either way 1035mcg every 2 weeks for 1 month then monthly. Recheck in 3 months.

## 2015-05-24 NOTE — Telephone Encounter (Signed)
Patient scheduled.

## 2015-05-25 ENCOUNTER — Ambulatory Visit (INDEPENDENT_AMBULATORY_CARE_PROVIDER_SITE_OTHER): Payer: Medicaid Other | Admitting: Family Medicine

## 2015-05-25 ENCOUNTER — Other Ambulatory Visit: Payer: Self-pay | Admitting: *Deleted

## 2015-05-25 VITALS — BP 106/71 | HR 89 | Resp 16 | Wt 158.0 lb

## 2015-05-25 DIAGNOSIS — D510 Vitamin B12 deficiency anemia due to intrinsic factor deficiency: Secondary | ICD-10-CM

## 2015-05-25 MED ORDER — CYANOCOBALAMIN 1000 MCG/ML IJ SOLN
1000.0000 ug | Freq: Once | INTRAMUSCULAR | Status: AC
  Administered 2015-05-25: 1000 ug via INTRAMUSCULAR

## 2015-05-25 MED ORDER — CYANOCOBALAMIN 1000 MCG/ML IJ SOLN: 1000.0000 ug | INTRAMUSCULAR | Status: DC

## 2015-05-25 NOTE — Progress Notes (Signed)
Agree with below. \Jamie Metheney, MD  

## 2015-05-25 NOTE — Progress Notes (Signed)
   Subjective:    Patient ID: Jamie [name], female    DOB: February 20, 1951, 64 y.o.   MRN: [id number]  HPIPatient is here for a vitamin B 12 injection, restarting this therapy. Denies gastrointestinal problems or dizziness. Given option, per Dr.Metheney, to have injections q 2 weeks x 2 months; repeat lab for B 12 level; and proceed to spacing out injections, if appropriate. Patient requests injection q 2 weeks.    Review of Systems     Objective:   Physical Exam        Assessment & Plan:  Patient tolerated injection well without complications. Patient advised to schedule next injection in 14 days.

## 2015-06-13 ENCOUNTER — Encounter: Payer: Self-pay | Admitting: Physician Assistant

## 2015-06-13 ENCOUNTER — Ambulatory Visit (INDEPENDENT_AMBULATORY_CARE_PROVIDER_SITE_OTHER): Payer: Medicaid Other | Admitting: Physician Assistant

## 2015-06-13 VITALS — BP 120/68 | HR 102 | Ht 65.0 in | Wt 153.0 lb

## 2015-06-13 DIAGNOSIS — K5730 Diverticulosis of large intestine without perforation or abscess without bleeding: Secondary | ICD-10-CM | POA: Diagnosis not present

## 2015-06-13 DIAGNOSIS — E538 Deficiency of other specified B group vitamins: Secondary | ICD-10-CM

## 2015-06-13 DIAGNOSIS — R319 Hematuria, unspecified: Secondary | ICD-10-CM | POA: Diagnosis not present

## 2015-06-13 DIAGNOSIS — R300 Dysuria: Secondary | ICD-10-CM | POA: Diagnosis not present

## 2015-06-13 HISTORY — DX: Deficiency of other specified B group vitamins: E53.8

## 2015-06-13 LAB — POCT URINALYSIS DIPSTICK
Bilirubin, UA: NEGATIVE
GLUCOSE UA: NEGATIVE
KETONES UA: NEGATIVE
Nitrite, UA: NEGATIVE
Protein, UA: NEGATIVE
Spec Grav, UA: <=1.005
UROBILINOGEN UA: 0.2
pH, UA: 6

## 2015-06-13 LAB — WET PREP FOR TRICH, YEAST, CLUE
Clue Cells Wet Prep HPF POC: NONE SEEN
Trich, Wet Prep: NONE SEEN
Yeast Wet Prep HPF POC: NONE SEEN

## 2015-06-13 MED ORDER — CYANOCOBALAMIN 1000 MCG/ML IJ SOLN
1000.0000 ug | Freq: Once | INTRAMUSCULAR | Status: AC
  Administered 2015-06-13: 1000 ug via INTRAMUSCULAR

## 2015-06-13 MED ORDER — AMOXICILLIN-POT CLAVULANATE 875-125 MG PO TABS: 1.0000 | ORAL_TABLET | Freq: Two times a day (BID) | ORAL | Status: DC

## 2015-06-13 NOTE — Patient Instructions (Addendum)
Will refer to GI.   Diverticulitis Diverticulitis is inflammation or infection of small pouches in your colon that form when you have a condition called diverticulosis. The pouches in your colon are called diverticula. Your colon, or large intestine, is where water is absorbed and stool is formed. Complications of diverticulitis can include:  Bleeding.  Severe infection.  Severe pain.  Perforation of your colon.  Obstruction of your colon. CAUSES  Diverticulitis is caused by bacteria. Diverticulitis happens when stool becomes trapped in diverticula. This allows bacteria to grow in the diverticula, which can lead to inflammation and infection. RISK FACTORS People with diverticulosis are at risk for diverticulitis. Eating a diet that does not include enough fiber from fruits and vegetables may make diverticulitis more likely to develop. SYMPTOMS  Symptoms of diverticulitis may include:  Abdominal pain and tenderness. The pain is normally located on the left side of the abdomen, but may occur in other areas.  Fever and chills.  Bloating.  Cramping.  Nausea.  Vomiting.  Constipation.  Diarrhea.  Blood in your stool. DIAGNOSIS  Your health care provider will ask you about your medical history and do a physical exam. You may need to have tests done because many medical conditions can cause the same symptoms as diverticulitis. Tests may include:  Blood tests.  Urine tests.  Imaging tests of the abdomen, including X-rays and CT scans. When your condition is under control, your health care provider may recommend that you have a colonoscopy. A colonoscopy can show how severe your diverticula are and whether something else is causing your symptoms. TREATMENT  Most cases of diverticulitis are mild and can be treated at home. Treatment may include:  Taking over-the-counter pain medicines.  Following a clear liquid diet.  Taking antibiotic medicines by mouth for 7-10  days. More severe cases may be treated at a hospital. Treatment may include:  Not eating or drinking.  Taking prescription pain medicine.  Receiving antibiotic medicines through an IV tube.  Receiving fluids and nutrition through an IV tube.  Surgery. HOME CARE INSTRUCTIONS   Follow your health care provider's instructions carefully.  Follow a full liquid diet or other diet as directed by your health care provider. After your symptoms improve, your health care provider may tell you to change your diet. He or she may recommend you eat a high-fiber diet. Fruits and vegetables are good sources of fiber. Fiber makes it easier to pass stool.  Take fiber supplements or probiotics as directed by your health care provider.  Only take medicines as directed by your health care provider.  Keep all your follow-up appointments. SEEK MEDICAL CARE IF:   Your pain does not improve.  You have a hard time eating food.  Your bowel movements do not return to normal. SEEK IMMEDIATE MEDICAL CARE IF:   Your pain becomes worse.  Your symptoms do not get better.  Your symptoms suddenly get worse.  You have a fever.  You have repeated vomiting.  You have bloody or black, tarry stools. MAKE SURE YOU:   Understand these instructions.  Will watch your condition.  Will get help right away if you are not doing well or get worse.   This information is not intended to replace advice given to you by your health care provider. Make sure you discuss any questions you have with your health care provider.   Document Released: 05/09/2005 Document Revised: 08/04/2013 Document Reviewed: 06/24/2013 Elsevier Interactive Patient Education [employer].

## 2015-06-13 NOTE — Progress Notes (Addendum)
Jamie [name] is a 64 y.o. female who presents to [facility] today for diverticulitis flare. Jamie [name] states that she began experiencing abdominal pain and alternating constipation and loose stools last Wednesday. She states that she thinks this is a diverticulitis flare up because she has had them numerous times before and she states that this feels the same as her previous flare. She has been using a heating pad on her lower back and lower abdomen which helps some. She states that having a full bladder makes her abdominal pain increase. She is also fearful that her medication has given her a UTI or a yeast infection; she states that she has a "funny feeling" in her vagina. She denies dysuria, hematuria, urinary hesitancy, urinary frequency, pruritis, erythema, discharge, fever, chills or flank pain.  Jamie [name] would also like to get her B12 injection today but she is concerned it may interact with her medication that is being used to treat her diverticulitis.   Past Medical History  Diagnosis Date  . Anxiety   . Hyperlipidemia   . Diverticulosis   . IBS (irritable bowel syndrome)   . Depression   . Diverticulitis   . Headache   . Lung mass    Past Surgical History  Procedure Laterality Date  . Abdominal hysterectomy    . Tubal ligation    . Hemorrhoid surgery    . Tumor removal      Left thigh    Social History  Substance Use Topics  . Smoking status: Former Smoker -- 0.80 packs/day for 20 years    Types: Cigarettes    Quit date: 08/13/1985  . Smokeless tobacco: Never Used  . Alcohol Use: Yes     Comment: rare   family history includes Alcohol abuse in her sister; Arthritis in her sister; Cancer in her mother and sister; Diabetes in her mother; Fibromyalgia in her sister; Heart disease in her brother and father; Irritable bowel syndrome in her sister; Leukemia in an other family member.  ROS as above Medications: Current Outpatient  Prescriptions  Medication Sig Dispense Refill  . ALOE VERA PO Take 1 Dose by mouth 2 (two) times daily. 3oz twice daily    . ALPRAZolam (XANAX) 0.25 MG tablet TAKE 1 TABLET BY MOUTH AT BEDTIME AS NEEDED FOR ANXIETY OR SLEEP 30 tablet 1  . citalopram (CELEXA) 10 MG tablet Take 1 tablet (10 mg total) by mouth daily. 30 tablet 0  . esomeprazole (NEXIUM) 40 MG capsule Take 1 capsule (40 mg total) by mouth daily. 30 capsule 3  . ondansetron (ZOFRAN) 4 MG tablet Take 1 tablet (4 mg total) by mouth every 8 (eight) hours as needed for nausea or vomiting. 20 tablet 0  . SUMAtriptan (IMITREX) 100 MG tablet Take 1 tablet (100 mg total) by mouth every 2 (two) hours as needed for migraine. May repeat in 2 hours if headache persists or recurs. 10 tablet 0  . amoxicillin-clavulanate (AUGMENTIN) 875-125 MG tablet Take 1 tablet by mouth 2 (two) times daily. 20 tablet 0   Current Facility-Administered Medications  Medication Dose Route Frequency Provider Last Rate Last Dose  . cyanocobalamin ((VITAMIN B-12)) injection 1,000 mcg  1,000 mcg Intramuscular Once [name], PA-C       Allergies  Allergen Reactions  . Ciprofloxacin     Facial numbness  . Peanut Butter Flavor     N/V  . Shellfish Allergy Swelling     Exam:  BP 120/68  mmHg  Pulse 102  Ht 5\' 5"  (1.651 m)  Wt 153 lb (69.4 kg)  BMI 25.46 kg/m2 Gen: WDWN female in no acute distress.  HEENT: EOMI,  MMM Lungs: Normal work of breathing. CTABL Heart: RRR no MRG Abd: NABS, Soft. Nondistended. No guarding or rigidity noted. Palpation of LLQ and lower middle abdomen near pubic symphysis produces extreme pain. Patient requests that the examination be stopped because of intense pain.  Exts: Brisk capillary refill, warm and well perfused.  Back: No CVA tenderness noted. Patient denies pain on palpation of spine or lumbar region of back.   Results for orders placed or performed in visit on 06/13/15 (from the past 24 hour(s))  POCT urinalysis  dipstick     Status: Abnormal   Collection Time: 06/13/15  2:36 PM  Result Value Ref Range   Color, UA yellow    Clarity, UA clear    Glucose, UA neg    Bilirubin, UA neg    Ketones, UA neg    Spec Grav, UA <=1.005    Blood, UA trace-lysed    pH, UA 6.0    Protein, UA neg    Urobilinogen, UA 0.2    Nitrite, UA neg    Leukocytes, UA Trace (A) Negative   Assessment: 1. Likely diverticulitis flare based on patient history of diverticulitis coupled with patient description of symptoms and extreme pain upon palpation of abdomen.  2. Possible bacterial UTI based on patient description of symptoms, tenderness on palpation of bladder and UA results showing trace leukocytes and lysed blood.  3. B12 deficency  Plan: 1. Patient prescribed amoxicillin-clavulanate 875-125 mg tablets QD for management of diverticulitis based on symptoms and hx.  Referral placed with GI for consult of further diverticulitis management. Given diet to help prevent diverticulitis.   2. Urine sent for culture to determine causative agents for possible UTI. Wet prep also sent to the lab to r/o vaginal candidiasis.  3. b12 1037mcg given in office today.   Patient instructed to return to office or ED if symptoms worsen, patient experiences chest pain, syncope or shortness of breath. Side effects of medication explained and all questions answered. Patient conveyed understanding and agreed to current treatment plan.

## 2015-06-15 ENCOUNTER — Encounter (HOSPITAL_BASED_OUTPATIENT_CLINIC_OR_DEPARTMENT_OTHER): Payer: Self-pay

## 2015-06-15 ENCOUNTER — Inpatient Hospital Stay (HOSPITAL_BASED_OUTPATIENT_CLINIC_OR_DEPARTMENT_OTHER)
Admission: EM | Admit: 2015-06-15 | Discharge: 2015-06-18 | DRG: 872 | Disposition: A | Discharge status: Home / Self Care | Payer: Medicaid Other | Attending: Internal Medicine | Admitting: Internal Medicine

## 2015-06-15 ENCOUNTER — Emergency Department (HOSPITAL_BASED_OUTPATIENT_CLINIC_OR_DEPARTMENT_OTHER): Payer: Medicaid Other

## 2015-06-15 ENCOUNTER — Telehealth: Payer: Self-pay | Admitting: Physician Assistant

## 2015-06-15 DIAGNOSIS — Z8249 Family history of ischemic heart disease and other diseases of the circulatory system: Secondary | ICD-10-CM | POA: Diagnosis not present

## 2015-06-15 DIAGNOSIS — K589 Irritable bowel syndrome without diarrhea: Secondary | ICD-10-CM | POA: Diagnosis present

## 2015-06-15 DIAGNOSIS — F418 Other specified anxiety disorders: Secondary | ICD-10-CM | POA: Diagnosis not present

## 2015-06-15 DIAGNOSIS — Z806 Family history of leukemia: Secondary | ICD-10-CM

## 2015-06-15 DIAGNOSIS — E785 Hyperlipidemia, unspecified: Secondary | ICD-10-CM | POA: Diagnosis present

## 2015-06-15 DIAGNOSIS — Z91013 Allergy to seafood: Secondary | ICD-10-CM

## 2015-06-15 DIAGNOSIS — K5720 Diverticulitis of large intestine with perforation and abscess without bleeding: Secondary | ICD-10-CM | POA: Diagnosis present

## 2015-06-15 DIAGNOSIS — F329 Major depressive disorder, single episode, unspecified: Secondary | ICD-10-CM | POA: Diagnosis present

## 2015-06-15 DIAGNOSIS — R109 Unspecified abdominal pain: Secondary | ICD-10-CM

## 2015-06-15 DIAGNOSIS — K5780 Diverticulitis of intestine, part unspecified, with perforation and abscess without bleeding: Secondary | ICD-10-CM | POA: Diagnosis present

## 2015-06-15 DIAGNOSIS — Z87891 Personal history of nicotine dependence: Secondary | ICD-10-CM | POA: Diagnosis not present

## 2015-06-15 DIAGNOSIS — D649 Anemia, unspecified: Secondary | ICD-10-CM | POA: Diagnosis present

## 2015-06-15 DIAGNOSIS — Z91010 Allergy to peanuts: Secondary | ICD-10-CM | POA: Diagnosis not present

## 2015-06-15 DIAGNOSIS — Z8049 Family history of malignant neoplasm of other genital organs: Secondary | ICD-10-CM | POA: Diagnosis not present

## 2015-06-15 DIAGNOSIS — A419 Sepsis, unspecified organism: Principal | ICD-10-CM | POA: Diagnosis present

## 2015-06-15 DIAGNOSIS — Z833 Family history of diabetes mellitus: Secondary | ICD-10-CM | POA: Diagnosis not present

## 2015-06-15 DIAGNOSIS — R1084 Generalized abdominal pain: Secondary | ICD-10-CM | POA: Diagnosis present

## 2015-06-15 DIAGNOSIS — K5730 Diverticulosis of large intestine without perforation or abscess without bleeding: Secondary | ICD-10-CM | POA: Diagnosis present

## 2015-06-15 DIAGNOSIS — R1010 Upper abdominal pain, unspecified: Secondary | ICD-10-CM

## 2015-06-15 DIAGNOSIS — Z8051 Family history of malignant neoplasm of kidney: Secondary | ICD-10-CM | POA: Diagnosis not present

## 2015-06-15 DIAGNOSIS — F4323 Adjustment disorder with mixed anxiety and depressed mood: Secondary | ICD-10-CM | POA: Diagnosis not present

## 2015-06-15 DIAGNOSIS — Z881 Allergy status to other antibiotic agents status: Secondary | ICD-10-CM | POA: Diagnosis not present

## 2015-06-15 DIAGNOSIS — F419 Anxiety disorder, unspecified: Secondary | ICD-10-CM | POA: Diagnosis present

## 2015-06-15 DIAGNOSIS — Z79899 Other long term (current) drug therapy: Secondary | ICD-10-CM

## 2015-06-15 HISTORY — DX: Syncope and collapse: R55

## 2015-06-15 LAB — COMPREHENSIVE METABOLIC PANEL
ALK PHOS: 56 U/L (ref 38–126)
ALT: 18 U/L (ref 14–54)
AST: 20 U/L (ref 15–41)
Albumin: 4.1 g/dL (ref 3.5–5.0)
Anion gap: 7 (ref 5–15)
BILIRUBIN TOTAL: 0.8 mg/dL (ref 0.3–1.2)
BUN: 10 mg/dL (ref 6–20)
CALCIUM: 8.8 mg/dL — AB (ref 8.9–10.3)
CO2: 26 mmol/L (ref 22–32)
CREATININE: 0.82 mg/dL (ref 0.44–1.00)
Chloride: 102 mmol/L (ref 101–111)
Glucose, Bld: 118 mg/dL — ABNORMAL HIGH (ref 65–99)
Potassium: 4 mmol/L (ref 3.5–5.1)
Sodium: 135 mmol/L (ref 135–145)
Total Protein: 7.8 g/dL (ref 6.5–8.1)

## 2015-06-15 LAB — CBC
HCT: 38.5 % (ref 36.0–46.0)
HEMOGLOBIN: 13.2 g/dL (ref 12.0–15.0)
MCH: 30.8 pg (ref 26.0–34.0)
MCHC: 34.3 g/dL (ref 30.0–36.0)
MCV: 90 fL (ref 78.0–100.0)
Platelets: 308 10*3/uL (ref 150–400)
RBC: 4.28 MIL/uL (ref 3.87–5.11)
RDW: 11.6 % (ref 11.5–15.5)
WBC: 13 10*3/uL — ABNORMAL HIGH (ref 4.0–10.5)

## 2015-06-15 LAB — URINE CULTURE

## 2015-06-15 LAB — URINALYSIS, ROUTINE W REFLEX MICROSCOPIC
BILIRUBIN URINE: NEGATIVE
Glucose, UA: NEGATIVE mg/dL
HGB URINE DIPSTICK: NEGATIVE
Ketones, ur: NEGATIVE mg/dL
Leukocytes, UA: NEGATIVE
Nitrite: NEGATIVE
PROTEIN: NEGATIVE mg/dL
SPECIFIC GRAVITY, URINE: 1.006 (ref 1.005–1.030)
UROBILINOGEN UA: 0.2 mg/dL (ref 0.0–1.0)
pH: 6 (ref 5.0–8.0)

## 2015-06-15 LAB — LIPASE, BLOOD: LIPASE: 24 U/L (ref 11–51)

## 2015-06-15 MED ORDER — IOHEXOL 300 MG/ML  SOLN
100.0000 mL | Freq: Once | INTRAMUSCULAR | Status: AC | PRN
  Administered 2015-06-15: 100 mL via INTRAVENOUS

## 2015-06-15 MED ORDER — SODIUM CHLORIDE 0.9 % IV BOLUS (SEPSIS)
1000.0000 mL | Freq: Once | INTRAVENOUS | Status: AC
  Administered 2015-06-15: 1000 mL via INTRAVENOUS

## 2015-06-15 MED ORDER — HYDROMORPHONE HCL 1 MG/ML IJ SOLN
0.5000 mg | INTRAMUSCULAR | Status: DC | PRN
  Administered 2015-06-15 – 2015-06-16 (×3): 1 mg via INTRAVENOUS
  Administered 2015-06-16: 0.5 mg via INTRAVENOUS
  Filled 2015-06-15 (×5): qty 1

## 2015-06-15 MED ORDER — ONDANSETRON HCL 4 MG PO TABS
4.0000 mg | ORAL_TABLET | Freq: Three times a day (TID) | ORAL | Status: DC | PRN
  Filled 2015-06-15: qty 1

## 2015-06-15 MED ORDER — ALPRAZOLAM 0.25 MG PO TABS
0.2500 mg | ORAL_TABLET | Freq: Every evening | ORAL | Status: DC | PRN
  Administered 2015-06-17: 0.25 mg via ORAL
  Filled 2015-06-15 (×2): qty 1

## 2015-06-15 MED ORDER — HYDROMORPHONE HCL 1 MG/ML IJ SOLN
1.0000 mg | Freq: Once | INTRAMUSCULAR | Status: AC
  Administered 2015-06-15: 1 mg via INTRAVENOUS

## 2015-06-15 MED ORDER — HYDROMORPHONE HCL 1 MG/ML IJ SOLN
INTRAMUSCULAR | Status: AC
  Filled 2015-06-15: qty 1

## 2015-06-15 MED ORDER — PANTOPRAZOLE SODIUM 40 MG PO TBEC
80.0000 mg | DELAYED_RELEASE_TABLET | Freq: Every day | ORAL | Status: DC
  Administered 2015-06-16 – 2015-06-18 (×3): 80 mg via ORAL
  Filled 2015-06-15 (×3): qty 2

## 2015-06-15 MED ORDER — SODIUM CHLORIDE 0.9 % IV SOLN
1.5000 g | Freq: Once | INTRAVENOUS | Status: AC
  Administered 2015-06-15: 1.5 g via INTRAVENOUS
  Filled 2015-06-15: qty 1.5

## 2015-06-15 MED ORDER — ONDANSETRON HCL 4 MG/2ML IJ SOLN
4.0000 mg | Freq: Four times a day (QID) | INTRAMUSCULAR | Status: DC | PRN
  Administered 2015-06-15 – 2015-06-17 (×2): 4 mg via INTRAVENOUS
  Filled 2015-06-15 (×2): qty 2

## 2015-06-15 MED ORDER — HYDROMORPHONE HCL 1 MG/ML IJ SOLN
0.5000 mg | INTRAMUSCULAR | Status: DC | PRN
  Filled 2015-06-15: qty 1

## 2015-06-15 MED ORDER — CITALOPRAM HYDROBROMIDE 20 MG PO TABS
10.0000 mg | ORAL_TABLET | Freq: Every day | ORAL | Status: DC
  Filled 2015-06-15: qty 1

## 2015-06-15 MED ORDER — HYDROMORPHONE HCL 1 MG/ML IJ SOLN
1.0000 mg | Freq: Once | INTRAMUSCULAR | Status: AC
  Administered 2015-06-15: 1 mg via INTRAVENOUS
  Filled 2015-06-15: qty 1

## 2015-06-15 MED ORDER — AMPICILLIN-SULBACTAM SODIUM 3 (2-1) G IJ SOLR
3.0000 g | Freq: Four times a day (QID) | INTRAMUSCULAR | Status: DC
  Administered 2015-06-15 – 2015-06-16 (×2): 3 g via INTRAVENOUS
  Filled 2015-06-15 (×4): qty 3

## 2015-06-15 MED ORDER — SODIUM CHLORIDE 0.9 % IV SOLN
INTRAVENOUS | Status: DC
  Administered 2015-06-15 – 2015-06-18 (×6): via INTRAVENOUS

## 2015-06-15 MED ORDER — IOHEXOL 300 MG/ML  SOLN
25.0000 mL | Freq: Once | INTRAMUSCULAR | Status: AC | PRN
  Administered 2015-06-15: 25 mL via ORAL

## 2015-06-15 MED ORDER — HEPARIN SODIUM (PORCINE) 5000 UNIT/ML IJ SOLN
5000.0000 [IU] | Freq: Three times a day (TID) | INTRAMUSCULAR | Status: DC
  Administered 2015-06-15 – 2015-06-17 (×7): 5000 [IU] via SUBCUTANEOUS
  Filled 2015-06-15 (×7): qty 1

## 2015-06-15 MED ORDER — ONDANSETRON HCL 4 MG/2ML IJ SOLN
4.0000 mg | Freq: Once | INTRAMUSCULAR | Status: AC
  Administered 2015-06-15: 4 mg via INTRAVENOUS
  Filled 2015-06-15: qty 2

## 2015-06-15 NOTE — ED Notes (Signed)
abd pain, n/v,d since last Wednesday-seen at [facility] last Wednesday-PCP yesterday-pt on second abx for possible diverticulitis-was advised by PCP to f/u with GI and given r/t and appt for 2 days

## 2015-06-15 NOTE — Telephone Encounter (Signed)
Ok will you please get approved and schedule CT of abdomen and pelvis with contrast for diverticulitis symptoms/abdominal pain and now radiating into back.

## 2015-06-15 NOTE — ED Notes (Signed)
Patient transported to CT 

## 2015-06-15 NOTE — ED Provider Notes (Signed)
CSN: [id number]     Arrival date & time 06/15/15  1410 History   First MD Initiated Contact with Patient 06/15/15 1500     Chief Complaint  Patient presents with  . Abdominal Pain     (Consider location/radiation/quality/duration/timing/severity/associated sxs/prior Treatment) Patient is a 64 y.o. female presenting with abdominal pain.  Abdominal Pain Pain location:  Generalized Pain quality: aching, cramping and sharp   Pain quality: not gnawing   Pain radiates to:  Back Pain severity:  Mild Onset quality:  Gradual Duration:  1 week Timing:  Constant Progression:  Worsening Chronicity:  Recurrent Context: not alcohol use and not eating   Associated symptoms: chills, cough, fever, nausea and vomiting   Associated symptoms: no dysuria and no shortness of breath     Past Medical History  Diagnosis Date  . Anxiety   . Hyperlipidemia   . Diverticulosis   . IBS (irritable bowel syndrome)   . Depression   . Diverticulitis   . Headache   . Lung mass    Past Surgical History  Procedure Laterality Date  . Abdominal hysterectomy    . Tubal ligation    . Hemorrhoid surgery    . Tumor removal      Left thigh    Family History  Problem Relation Age of Onset  . Cancer Mother     renal cell carcinoma  . Diabetes Mother   . Heart disease Father     first MI in 80s  . Cancer Sister     cervical cancer  . Heart disease Brother     first MI in 45s  . Alcohol abuse Sister   . Fibromyalgia Sister   . Arthritis Sister     osteoarthritis  . Irritable bowel syndrome Sister   . Leukemia      grandson   Social History  Substance Use Topics  . Smoking status: Former Smoker -- 0.80 packs/day for 20 years    Types: Cigarettes    Quit date: 08/13/1985  . Smokeless tobacco: Never Used  . Alcohol Use: Yes     Comment: rare   OB History    No data available     Review of Systems  Constitutional: Positive for fever and chills.  Respiratory: Positive for cough. Negative  for apnea and shortness of breath.   Gastrointestinal: Positive for nausea, vomiting and abdominal pain.  Endocrine: Positive for polyuria. Negative for polydipsia.  Genitourinary: Negative for dysuria.  Musculoskeletal: Positive for back pain. Negative for neck pain.  Skin: Negative for pallor and rash.  All other systems reviewed and are negative.     Allergies  Ciprofloxacin; Peanut butter flavor; and Shellfish allergy  Home Medications   Prior to Admission medications   Medication Sig Start Date End Date Taking? Authorizing Provider  ALOE VERA PO Take 1 Dose by mouth 2 (two) times daily. 3oz twice daily   Yes Historical Provider, MD  ALPRAZolam (XANAX) 0.25 MG tablet TAKE 1 TABLET BY MOUTH AT BEDTIME AS NEEDED FOR ANXIETY OR SLEEP 04/20/15  Yes [name] L Breeback, PA-C  amoxicillin-clavulanate (AUGMENTIN) 875-125 MG tablet Take 1 tablet by mouth 2 (two) times daily. 10 day therapy course patient began on 06/13/15. 06/13/15  Yes Historical Provider, MD  esomeprazole (NEXIUM) 40 MG capsule Take 1 capsule (40 mg total) by mouth daily. Patient taking differently: Take 40 mg by mouth daily as needed (for acid reflux).  05/02/15  Yes [name] L Breeback, PA-C  hydroxypropyl methylcellulose / hypromellose (ISOPTO  TEARS / GONIOVISC) 2.5 % ophthalmic solution Place 1 drop into both eyes 3 (three) times daily as needed for dry eyes.   Yes Historical Provider, MD   BP 106/58 mmHg  Pulse 95  Temp(Src) 98.6 F (37 C) (Oral)  Resp 22  Ht 5\' 5"  (1.651 m)  Wt 156 lb 12 oz (71.1 kg)  BMI 26.08 kg/m2  SpO2 100% Physical Exam  Constitutional: She is oriented to person, place, and time. She appears well-developed and well-nourished.  HENT:  Head: Normocephalic and atraumatic.  Neck: Normal range of motion.  Cardiovascular: Normal rate and regular rhythm.   Pulmonary/Chest: No stridor. No respiratory distress.  Abdominal: She exhibits no distension. There is tenderness (bilateral lower quadrants).   Neurological: She is alert and oriented to person, place, and time.  Skin: Skin is warm and dry. No rash noted.  Nursing note and vitals reviewed.   ED Course  Procedures (including critical care time) Labs Review Labs Reviewed  CBC - Abnormal; Notable for the following:    WBC 13.0 (*)    All other components within normal limits  COMPREHENSIVE METABOLIC PANEL - Abnormal; Notable for the following:    Glucose, Bld 118 (*)    Calcium 8.8 (*)    All other components within normal limits  URINALYSIS, ROUTINE W REFLEX MICROSCOPIC (NOT AT [facility])  LIPASE, BLOOD  CBC  BASIC METABOLIC PANEL    Imaging Review Dg Chest 2 View  06/15/2015  CLINICAL DATA:  Cough and congestion for a few days EXAM: CHEST  2 VIEW COMPARISON:  02/16/2015 FINDINGS: Normal heart size, mediastinal contours, and pulmonary vascularity. Lungs clear. No pneumothorax. Bones demineralized. IMPRESSION: No acute abnormalities. Electronically Signed   By: Lavonia Dana M.D.   On: 06/15/2015 17:35   Ct Abdomen Pelvis W Contrast  06/15/2015  CLINICAL DATA:  Left-sided abdominal pain with nausea and cough. EXAM: CT ABDOMEN AND PELVIS WITH CONTRAST TECHNIQUE: Multidetector CT imaging of the abdomen and pelvis was performed using the standard protocol following bolus administration of intravenous contrast. CONTRAST:  30mL OMNIPAQUE IOHEXOL 300 MG/ML SOLN, 134mL OMNIPAQUE IOHEXOL 300 MG/ML SOLN COMPARISON:  PET 01/14/2016 and CT abdomen pelvis 02/20/2014. FINDINGS: Lower chest: Lung bases show no acute findings. Heart size normal. No pericardial or pleural effusion. Hepatobiliary: 5 mm low-attenuation lesion in the right hepatic lobe is unchanged and may represent a cyst or hamartoma. Liver and gallbladder are otherwise unremarkable. No biliary ductal dilatation. Pancreas: Negative. Spleen: Negative. Adrenals/Urinary Tract: Right ureter is decompressed. Left ureter is mildly dilated secondary to inflammatory changes in the left anatomic  pelvis. Bladder is grossly unremarkable. Stomach/Bowel: Stomach, small bowel, appendix and majority of the colon are unremarkable. There is soft tissue thickening involving the proximal sigmoid colon with pericolonic stranding. A 2.7 cm fluid collection is seen along the posterior margin of the proximal sigmoid colon (series 2, image 64). No extraluminal air. Vascular/Lymphatic: Atherosclerotic calcification of the arterial vasculature is mild. No pathologically enlarged lymph nodes. Reproductive: Hysterectomy. Right ovary is likely visualized. Left ovary is not well visualized. Other: No free fluid.  Mesenteries and peritoneum are unremarkable. Musculoskeletal: No worrisome lytic or sclerotic lesions. IMPRESSION: 1. Sigmoid diverticulitis complicated by microperforation and small abscess. 2. Mild left ureteral dilatation is seen secondarily. Electronically Signed   By: Lorin Picket M.D.   On: 06/15/2015 17:47   I have personally reviewed and evaluated these images and lab results as part of my medical decision-making.   EKG Interpretation None  MDM   Final diagnoses:  Diverticulitis of large intestine with abscess without bleeding  Sepsis, due to unspecified organism ([place])   Abdominal pain, tachycardia, fever, concern for possible complicated diverticulitis. Will scan, labs, fluids, anti-emetics, pain meds.   Found to have diverticulitis with microperforation and abscess. This in conjunction with tachycardia, leukocytosis, likely sepsis. Given unasyn.   D/w Dr. Leighton Ruff with surgery who will see patient on arrival to [facility].  D/W Dr. Olevia Bowens who accepts for admission to tele at [facility] 2/2 improving vitals and pain with conservative measures.      Merrily Pew, MD 06/16/15 [phone]

## 2015-06-15 NOTE — ED Notes (Signed)
MD at bedside. 

## 2015-06-15 NOTE — Progress Notes (Signed)
ANTIBIOTIC CONSULT NOTE - INITIAL  Pharmacy Consult for Unasyn Indication: Intra-abdominal infection  Allergies  Allergen Reactions  . Ciprofloxacin     Facial numbness  . Peanut Butter Flavor     N/V  . Shellfish Allergy Swelling    Patient Measurements: Height: 5\' 5"  (165.1 cm) Weight: 156 lb 12 oz (71.1 kg) IBW/kg (Calculated) : 57 Adjusted Body Weight:   Vital Signs: Temp: 98.6 F (37 C) (11/02 2111) Temp Source: Oral (11/02 2111) BP: 106/58 mmHg (11/02 2111) Pulse Rate: 95 (11/02 2111) Intake/Output from previous day:   Intake/Output from this shift:    Labs:  Recent Labs  06/15/15 1510  WBC 13.0*  HGB 13.2  PLT 308  CREATININE 0.82   Estimated Creatinine Clearance: 70.3 mL/min (by C-G formula based on Cr of 0.82). No results for input(s): VANCOTROUGH, VANCOPEAK, VANCORANDOM, GENTTROUGH, GENTPEAK, GENTRANDOM, TOBRATROUGH, TOBRAPEAK, TOBRARND, AMIKACINPEAK, AMIKACINTROU, AMIKACIN in the last 72 hours.   Microbiology: Recent Results (from the past 720 hour(s))  Urine Culture     Status: None   Collection Time: 06/13/15  2:20 PM  Result Value Ref Range Status   Colony Count 25,000 COLONIES/ML  Final   Organism ID, Bacteria Multiple bacterial morphotypes present, none  Final   Organism ID, Bacteria predominant. Suggest appropriate recollection if   Final   Organism ID, Bacteria clinically indicated.  Final  WET PREP FOR [place], YEAST, CLUE     Status: None   Collection Time: 06/13/15  2:35 PM  Result Value Ref Range Status   Yeast Wet Prep HPF POC NONE SEEN NONE SEEN Final   Trich, Wet Prep NONE SEEN NONE SEEN Final   Clue Cells Wet Prep HPF POC NONE SEEN NONE SEEN Final   WBC, Wet Prep HPF POC FEW NONE SEEN Final    Medical History: Past Medical History  Diagnosis Date  . Anxiety   . Hyperlipidemia   . Diverticulosis   . IBS (irritable bowel syndrome)   . Depression   . Diverticulitis   . Headache   . Lung mass     Assessment: 28 yoF with  hx IBS and diverticulosis presents with 1 week onset of abdominal pain, tachycardia, and fever despite oral antibiotics.  CT a/p shows sigmoid diverticulitis complicated by microperforation and small abscess.  Pharmacy consulted to start unasyn for intra-abdominal infection.   Anti-infectives 11/2 >> Unasyn  >>  Vitals/Labs WBC: Elevated 13K Tm24h: 100 Renal: SCr 0.82, CrCl 70 m/min (N80)  Cultures None  Goal of Therapy:  Eradication of infection  Plan:  Unasyn 3g IV q6h F/u renal functions, cultures if drawn, clinical course  Ralene Bathe, PharmD, BCPS 06/15/2015, 9:26 PM  Pager: [phone]

## 2015-06-15 NOTE — Consult Note (Signed)
Reason for Consult: Perforated diverticulitis Referring Physician: Dr [name] is an 64 y.o. female.  HPI: 64 y.o. F with ~7 days of LLQ pain that has worsened over the last 3 days.  This is associated with nausea and vomiting and subjective fevers.  She presented to [facility] and was found to have diverticulitis with a microperforation.    Past Medical History  Diagnosis Date  . Anxiety   . Hyperlipidemia   . Diverticulosis   . IBS (irritable bowel syndrome)   . Depression   . Diverticulitis   . Headache   . Lung mass     Past Surgical History  Procedure Laterality Date  . Abdominal hysterectomy    . Tubal ligation    . Hemorrhoid surgery    . Tumor removal      Left thigh     Family History  Problem Relation Age of Onset  . Cancer Mother     renal cell carcinoma  . Diabetes Mother   . Heart disease Father     first MI in 31s  . Cancer Sister     cervical cancer  . Heart disease Brother     first MI in 60s  . Alcohol abuse Sister   . Fibromyalgia Sister   . Arthritis Sister     osteoarthritis  . Irritable bowel syndrome Sister   . Leukemia      grandson    Social History:  reports that she quit smoking about 29 years ago. Her smoking use included Cigarettes. She has a 16 pack-year smoking history. She has never used smokeless tobacco. She reports that she drinks alcohol. She reports that she does not use illicit drugs.  Allergies:  Allergies  Allergen Reactions  . Ciprofloxacin     Facial numbness  . Peanut Butter Flavor Nausea And Vomiting    N/V  . Shellfish Allergy Swelling    Medications: I have reviewed the patient's current medications.  Results for orders placed or performed during the hospital encounter of 06/15/15 (from the past 48 hour(s))  CBC     Status: Abnormal   Collection Time: 06/15/15  3:10 PM  Result Value Ref Range   WBC 13.0 (H) 4.0 - 10.5 K/uL   RBC 4.28 3.87 - 5.11 MIL/uL   Hemoglobin 13.2 12.0 - 15.0 g/dL   HCT 38.5  36.0 - 46.0 %   MCV 90.0 78.0 - 100.0 fL   MCH 30.8 26.0 - 34.0 pg   MCHC 34.3 30.0 - 36.0 g/dL   RDW 11.6 11.5 - 15.5 %   Platelets 308 150 - 400 K/uL  Comprehensive metabolic panel     Status: Abnormal   Collection Time: 06/15/15  3:10 PM  Result Value Ref Range   Sodium 135 135 - 145 mmol/L   Potassium 4.0 3.5 - 5.1 mmol/L   Chloride 102 101 - 111 mmol/L   CO2 26 22 - 32 mmol/L   Glucose, Bld 118 (H) 65 - 99 mg/dL   BUN 10 6 - 20 mg/dL   Creatinine, Ser 0.82 0.44 - 1.00 mg/dL   Calcium 8.8 (L) 8.9 - 10.3 mg/dL   Total Protein 7.8 6.5 - 8.1 g/dL   Albumin 4.1 3.5 - 5.0 g/dL   AST 20 15 - 41 U/L   ALT 18 14 - 54 U/L   Alkaline Phosphatase 56 38 - 126 U/L   Total Bilirubin 0.8 0.3 - 1.2 mg/dL   GFR calc non Af  Amer >60 >60 mL/min   GFR calc Af Amer >60 >60 mL/min    Comment: (NOTE) The eGFR has been calculated using the CKD EPI equation. This calculation has not been validated in all clinical situations. eGFR's persistently <60 mL/min signify possible Chronic Kidney Disease.    Anion gap 7 5 - 15  Lipase, blood     Status: None   Collection Time: 06/15/15  3:10 PM  Result Value Ref Range   Lipase 24 11 - 51 U/L    Comment: Please note change in reference range.  Urinalysis, Routine w reflex microscopic (not at [facility])     Status: None   Collection Time: 06/15/15  4:50 PM  Result Value Ref Range   Color, Urine YELLOW YELLOW   APPearance CLEAR CLEAR   Specific Gravity, Urine 1.006 1.005 - 1.030   pH 6.0 5.0 - 8.0   Glucose, UA NEGATIVE NEGATIVE mg/dL   Hgb urine dipstick NEGATIVE NEGATIVE   Bilirubin Urine NEGATIVE NEGATIVE   Ketones, ur NEGATIVE NEGATIVE mg/dL   Protein, ur NEGATIVE NEGATIVE mg/dL   Urobilinogen, UA 0.2 0.0 - 1.0 mg/dL   Nitrite NEGATIVE NEGATIVE   Leukocytes, UA NEGATIVE NEGATIVE    Comment: MICROSCOPIC NOT DONE ON URINES WITH NEGATIVE PROTEIN, BLOOD, LEUKOCYTES, NITRITE, OR GLUCOSE <1000 mg/dL.    Dg Chest 2 View  06/15/2015  CLINICAL DATA:   Cough and congestion for a few days EXAM: CHEST  2 VIEW COMPARISON:  02/16/2015 FINDINGS: Normal heart size, mediastinal contours, and pulmonary vascularity. Lungs clear. No pneumothorax. Bones demineralized. IMPRESSION: No acute abnormalities. Electronically Signed   By: Lavonia Dana M.D.   On: 06/15/2015 17:35   Ct Abdomen Pelvis W Contrast  06/15/2015  CLINICAL DATA:  Left-sided abdominal pain with nausea and cough. EXAM: CT ABDOMEN AND PELVIS WITH CONTRAST TECHNIQUE: Multidetector CT imaging of the abdomen and pelvis was performed using the standard protocol following bolus administration of intravenous contrast. CONTRAST:  86m OMNIPAQUE IOHEXOL 300 MG/ML SOLN, 1038mOMNIPAQUE IOHEXOL 300 MG/ML SOLN COMPARISON:  PET 01/14/2016 and CT abdomen pelvis 02/20/2014. FINDINGS: Lower chest: Lung bases show no acute findings. Heart size normal. No pericardial or pleural effusion. Hepatobiliary: 5 mm low-attenuation lesion in the right hepatic lobe is unchanged and may represent a cyst or hamartoma. Liver and gallbladder are otherwise unremarkable. No biliary ductal dilatation. Pancreas: Negative. Spleen: Negative. Adrenals/Urinary Tract: Right ureter is decompressed. Left ureter is mildly dilated secondary to inflammatory changes in the left anatomic pelvis. Bladder is grossly unremarkable. Stomach/Bowel: Stomach, small bowel, appendix and majority of the colon are unremarkable. There is soft tissue thickening involving the proximal sigmoid colon with pericolonic stranding. A 2.7 cm fluid collection is seen along the posterior margin of the proximal sigmoid colon (series 2, image 64). No extraluminal air. Vascular/Lymphatic: Atherosclerotic calcification of the arterial vasculature is mild. No pathologically enlarged lymph nodes. Reproductive: Hysterectomy. Right ovary is likely visualized. Left ovary is not well visualized. Other: No free fluid.  Mesenteries and peritoneum are unremarkable. Musculoskeletal: No  worrisome lytic or sclerotic lesions. IMPRESSION: 1. Sigmoid diverticulitis complicated by microperforation and small abscess. 2. Mild left ureteral dilatation is seen secondarily. Electronically Signed   By: MeLorin Picket.D.   On: 06/15/2015 17:47    Review of Systems  Constitutional: Positive for fever and malaise/fatigue. Negative for chills.  Eyes: Negative for blurred vision.  Respiratory: Negative for cough and shortness of breath.   Cardiovascular: Negative for chest pain.  Gastrointestinal: Positive for nausea, vomiting  and abdominal pain.  Genitourinary: Negative for dysuria, urgency and frequency.  Musculoskeletal: Negative for myalgias.  Skin: Negative for itching and rash.  Neurological: Positive for weakness. Negative for dizziness, loss of consciousness and headaches.   Blood pressure 106/58, pulse 95, temperature 98.6 F (37 C), temperature source Oral, resp. rate 22, height '5\' 5"'  (1.651 m), weight 71.1 kg (156 lb 12 oz), SpO2 100 %. Physical Exam  Constitutional: She is oriented to person, place, and time. She appears well-developed and well-nourished. No distress.  HENT:  Head: Normocephalic and atraumatic.  Eyes: Conjunctivae are normal. Pupils are equal, round, and reactive to light.  Neck: Normal range of motion.  Cardiovascular: Normal rate and regular rhythm.   Respiratory: Effort normal and breath sounds normal. No respiratory distress.  GI: Soft. She exhibits no distension. There is tenderness. There is guarding. There is no rebound.  LLQ tenderness  Neurological: She is alert and oriented to person, place, and time.  Skin: Skin is warm and dry. She is not diaphoretic.    Assessment/Plan: 64 y.o. F with diverticulitis.  Recommend NPO and IVF rehydration.  Rocephin and Flagyl per diverticulitis guidelines.  May need IR to evaluate for possible drain if abscess forms.  Will need a colonoscopy in ~6-8 weeks and would recommend surgical resection in 3 months if  her symptoms resolve with antibiotics.  Will follow closely and will plan on OR if she becomes unstable.    Jamie [name] C. 75/08/9822, 29:98 PM

## 2015-06-15 NOTE — Progress Notes (Signed)
Patient ID: Jamie [name], female   DOB: [birth year], 64 y.o.   MRN: [id number] 64 year-old female with a past medical history of diverticulosis, hyperlipidemia, anxiety/depression, osteopenia, benign lung mass who was referred for transfer to [facility] by Dr. Dorise Bullion from [place] emergency department due to acute diverticulitis with microperforation and stable abscess. She presented to the facility with tachycardia at 136 bpm, low-grade temperature after a week of abdominal pain, nausea, vomiting and diarrhea which did not respond to oral antibiotic therapy. He consulted Dr. Leighton Ruff from the general surgery service which will be seeing the patient once she arrives to [facility].

## 2015-06-15 NOTE — H&P (Signed)
[facility] History and Physical  Jamie [name] HBZ:[id number] DOB: 04/14/1951 DOA: 06/15/2015  Referring physician: EDP PCP: [name], PA-C   Chief Complaint: Abd pain   HPI: Jamie [name] is a 64 y.o. female who presents to the ED with c/o 1 week history of generalized abdominal pain.  Pain has gradually worsened over past week, is sharp, associated with fevers, chills, N/V.  No dysuria.  Work up in ED demonstrates diverticulitis with small abscess and microperforation.  Review of Systems: Systems reviewed.  As above, otherwise negative  Past Medical History  Diagnosis Date  . Anxiety   . Hyperlipidemia   . Diverticulosis   . IBS (irritable bowel syndrome)   . Depression   . Diverticulitis   . Headache   . Lung mass    Past Surgical History  Procedure Laterality Date  . Abdominal hysterectomy    . Tubal ligation    . Hemorrhoid surgery    . Tumor removal      Left thigh    Social History:  reports that she quit smoking about 29 years ago. Her smoking use included Cigarettes. She has a 16 pack-year smoking history. She has never used smokeless tobacco. She reports that she drinks alcohol. She reports that she does not use illicit drugs.  Allergies  Allergen Reactions  . Ciprofloxacin     Facial numbness  . Peanut Butter Flavor Nausea And Vomiting    N/V  . Shellfish Allergy Swelling    Family History  Problem Relation Age of Onset  . Cancer Mother     renal cell carcinoma  . Diabetes Mother   . Heart disease Father     first MI in 11s  . Cancer Sister     cervical cancer  . Heart disease Brother     first MI in 43s  . Alcohol abuse Sister   . Fibromyalgia Sister   . Arthritis Sister     osteoarthritis  . Irritable bowel syndrome Sister   . Leukemia      grandson     Prior to Admission medications   Medication Sig Start Date End Date Taking? Authorizing Provider  ALOE VERA PO Take 1 Dose by mouth 2 (two) times daily. 3oz twice daily     Historical Provider, MD  ALPRAZolam (XANAX) 0.25 MG tablet TAKE 1 TABLET BY MOUTH AT BEDTIME AS NEEDED FOR ANXIETY OR SLEEP 04/20/15   [name] L Breeback, PA-C  citalopram (CELEXA) 10 MG tablet Take 1 tablet (10 mg total) by mouth daily. 04/20/15   [name] L Breeback, PA-C  esomeprazole (NEXIUM) 40 MG capsule Take 1 capsule (40 mg total) by mouth daily. 05/02/15   [name] L Breeback, PA-C  ondansetron (ZOFRAN) 4 MG tablet Take 1 tablet (4 mg total) by mouth every 8 (eight) hours as needed for nausea or vomiting. 04/27/15   [name] L Breeback, PA-C  SUMAtriptan (IMITREX) 100 MG tablet Take 1 tablet (100 mg total) by mouth every 2 (two) hours as needed for migraine. May repeat in 2 hours if headache persists or recurs. 04/20/15   [name], PA-C   Physical Exam: Filed Vitals:   06/15/15 2111  BP: 106/58  Pulse: 95  Temp: 98.6 F (37 C)  Resp: 22    BP 106/58 mmHg  Pulse 95  Temp(Src) 98.6 F (37 C) (Oral)  Resp 22  Ht 5\' 5"  (1.651 m)  Wt 71.1 kg (156 lb 12 oz)  BMI 26.08 kg/m2  SpO2 100%  General Appearance:    Alert, oriented, no distress, appears stated age  Head:    Normocephalic, atraumatic  Eyes:    PERRL, EOMI, sclera non-icteric        Nose:   Nares without drainage or epistaxis. Mucosa, turbinates normal  Throat:   Moist mucous membranes. Oropharynx without erythema or exudate.  Neck:   Supple. No carotid bruits.  No thyromegaly.  No lymphadenopathy.   Back:     No CVA tenderness, no spinal tenderness  Lungs:     Clear to auscultation bilaterally, without wheezes, rhonchi or rales  Chest wall:    No tenderness to palpitation  Heart:    Regular rate and rhythm without murmurs, gallops, rubs  Abdomen:     TTP over BLQ, nondistended, normal bowel sounds, no organomegaly  Genitalia:    deferred  Rectal:    deferred  Extremities:   No clubbing, cyanosis or edema.  Pulses:   2+ and symmetric all extremities  Skin:   Skin color, texture, turgor normal, no rashes or lesions  Lymph nodes:    Cervical, supraclavicular, and axillary nodes normal  Neurologic:   CNII-XII intact. Normal strength, sensation and reflexes      throughout    Labs on Admission:  Basic Metabolic Panel:  Recent Labs Lab 06/15/15 1510  NA 135  K 4.0  CL 102  CO2 26  GLUCOSE 118*  BUN 10  CREATININE 0.82  CALCIUM 8.8*   Liver Function Tests:  Recent Labs Lab 06/15/15 1510  AST 20  ALT 18  ALKPHOS 56  BILITOT 0.8  PROT 7.8  ALBUMIN 4.1    Recent Labs Lab 06/15/15 1510  LIPASE 24   No results for input(s): AMMONIA in the last 168 hours. CBC:  Recent Labs Lab 06/15/15 1510  WBC 13.0*  HGB 13.2  HCT 38.5  MCV 90.0  PLT 308   Cardiac Enzymes: No results for input(s): CKTOTAL, CKMB, CKMBINDEX, TROPONINI in the last 168 hours.  BNP (last 3 results) No results for input(s): PROBNP in the last 8760 hours. CBG: No results for input(s): GLUCAP in the last 168 hours.  Radiological Exams on Admission: Dg Chest 2 View  06/15/2015  CLINICAL DATA:  Cough and congestion for a few days EXAM: CHEST  2 VIEW COMPARISON:  02/16/2015 FINDINGS: Normal heart size, mediastinal contours, and pulmonary vascularity. Lungs clear. No pneumothorax. Bones demineralized. IMPRESSION: No acute abnormalities. Electronically Signed   By: Lavonia Dana M.D.   On: 06/15/2015 17:35   Ct Abdomen Pelvis W Contrast  06/15/2015  CLINICAL DATA:  Left-sided abdominal pain with nausea and cough. EXAM: CT ABDOMEN AND PELVIS WITH CONTRAST TECHNIQUE: Multidetector CT imaging of the abdomen and pelvis was performed using the standard protocol following bolus administration of intravenous contrast. CONTRAST:  65mL OMNIPAQUE IOHEXOL 300 MG/ML SOLN, 144mL OMNIPAQUE IOHEXOL 300 MG/ML SOLN COMPARISON:  PET 01/14/2016 and CT abdomen pelvis 02/20/2014. FINDINGS: Lower chest: Lung bases show no acute findings. Heart size normal. No pericardial or pleural effusion. Hepatobiliary: 5 mm low-attenuation lesion in the right hepatic lobe  is unchanged and may represent a cyst or hamartoma. Liver and gallbladder are otherwise unremarkable. No biliary ductal dilatation. Pancreas: Negative. Spleen: Negative. Adrenals/Urinary Tract: Right ureter is decompressed. Left ureter is mildly dilated secondary to inflammatory changes in the left anatomic pelvis. Bladder is grossly unremarkable. Stomach/Bowel: Stomach, small bowel, appendix and majority of the colon are unremarkable. There is soft tissue thickening involving the proximal sigmoid colon with pericolonic  stranding. A 2.7 cm fluid collection is seen along the posterior margin of the proximal sigmoid colon (series 2, image 64). No extraluminal air. Vascular/Lymphatic: Atherosclerotic calcification of the arterial vasculature is mild. No pathologically enlarged lymph nodes. Reproductive: Hysterectomy. Right ovary is likely visualized. Left ovary is not well visualized. Other: No free fluid.  Mesenteries and peritoneum are unremarkable. Musculoskeletal: No worrisome lytic or sclerotic lesions. IMPRESSION: 1. Sigmoid diverticulitis complicated by microperforation and small abscess. 2. Mild left ureteral dilatation is seen secondarily. Electronically Signed   By: Lorin Picket M.D.   On: 06/15/2015 17:47    EKG: Independently reviewed.  Assessment/Plan Principal Problem:   Diverticulitis of intestine with abscess   1. Diverticulitis of intestine with abscess - 1. Continue unasyn per pharm consult 2. Dr. Marcello Moores paged to notify of patients arrival (EDP spoke with Dr. Marcello Moores who wanted [place] to admit patient to [facility]). 1. I doubt they will go with a primary open surgical approach at this point due to abscess small size (2.7 cm) 2. Instead I anticipate that at most they will end up having IR put in a perc drain tomorrow at this point. 3. NPO for now until they okay a diet 3. Dilaudid Q2H PRN pain control (lasts about 1.5 hours or so per patient) 4. IVF 5. Repeat CBC and BMP tomorrow AM 2. DVT  ppx - heparin [place] for now    Code Status: Full  Family Communication: Family at bedside Disposition Plan: Admit to inpatient   Time spent: 52 min  GARDNER, JARED M. [facility] Pager [phone]  If 7AM-7PM, please contact the day team taking care of the patient [email or web address] Password [facility] 06/15/2015, 9:33 PM

## 2015-06-15 NOTE — ED Notes (Signed)
Pt departing enroute to [place] at this time via [facility]

## 2015-06-15 NOTE — Telephone Encounter (Signed)
Pt called and would like a ct scan bc she is still hurting and she stated she noticed her upper back was also hurting where her kidneys are and would like you to include those in the scan. I told her we would call her when it is time to schedule the ct. Thanks

## 2015-06-15 NOTE — Telephone Encounter (Signed)
Ordered. PA required Authorization is [id number] from today until December 2 nd. Case number [id number]

## 2015-06-15 NOTE — ED Notes (Signed)
Attempt to cal report nurse unavailable

## 2015-06-16 DIAGNOSIS — A419 Sepsis, unspecified organism: Secondary | ICD-10-CM | POA: Diagnosis present

## 2015-06-16 DIAGNOSIS — K5720 Diverticulitis of large intestine with perforation and abscess without bleeding: Secondary | ICD-10-CM | POA: Diagnosis present

## 2015-06-16 LAB — CBC
HCT: 30.2 % — ABNORMAL LOW (ref 36.0–46.0)
HEMOGLOBIN: 10.3 g/dL — AB (ref 12.0–15.0)
MCH: 30.7 pg (ref 26.0–34.0)
MCHC: 34.1 g/dL (ref 30.0–36.0)
MCV: 89.9 fL (ref 78.0–100.0)
PLATELETS: 246 10*3/uL (ref 150–400)
RBC: 3.36 MIL/uL — AB (ref 3.87–5.11)
RDW: 11.9 % (ref 11.5–15.5)
WBC: 10.2 10*3/uL (ref 4.0–10.5)

## 2015-06-16 LAB — BASIC METABOLIC PANEL
ANION GAP: 5 (ref 5–15)
BUN: 9 mg/dL (ref 6–20)
CHLORIDE: 106 mmol/L (ref 101–111)
CO2: 27 mmol/L (ref 22–32)
Calcium: 8.2 mg/dL — ABNORMAL LOW (ref 8.9–10.3)
Creatinine, Ser: 0.76 mg/dL (ref 0.44–1.00)
GFR calc Af Amer: >60 mL/min (ref >60)
Glucose, Bld: 101 mg/dL — ABNORMAL HIGH (ref 65–99)
POTASSIUM: 3.7 mmol/L (ref 3.5–5.1)
SODIUM: 138 mmol/L (ref 135–145)

## 2015-06-16 MED ORDER — ACETAMINOPHEN 325 MG PO TABS
650.0000 mg | ORAL_TABLET | ORAL | Status: DC | PRN
  Administered 2015-06-16 – 2015-06-17 (×4): 650 mg via ORAL
  Filled 2015-06-16 (×4): qty 2

## 2015-06-16 MED ORDER — HYDROXYZINE HCL 10 MG PO TABS
10.0000 mg | ORAL_TABLET | Freq: Four times a day (QID) | ORAL | Status: DC | PRN
  Filled 2015-06-16: qty 1

## 2015-06-16 MED ORDER — PROMETHAZINE HCL 25 MG/ML IJ SOLN
12.5000 mg | Freq: Once | INTRAMUSCULAR | Status: AC
  Administered 2015-06-16: 12.5 mg via INTRAVENOUS
  Filled 2015-06-16: qty 1

## 2015-06-16 MED ORDER — PROMETHAZINE HCL 25 MG/ML IJ SOLN
12.5000 mg | Freq: Four times a day (QID) | INTRAMUSCULAR | Status: DC | PRN
  Administered 2015-06-17: 25 mg via INTRAVENOUS
  Filled 2015-06-16 (×2): qty 1

## 2015-06-16 MED ORDER — DOCUSATE SODIUM 100 MG PO CAPS
100.0000 mg | ORAL_CAPSULE | Freq: Two times a day (BID) | ORAL | Status: DC
  Administered 2015-06-16 (×2): 100 mg via ORAL
  Filled 2015-06-16 (×4): qty 1

## 2015-06-16 MED ORDER — PIPERACILLIN-TAZOBACTAM 3.375 G IVPB
3.3750 g | Freq: Three times a day (TID) | INTRAVENOUS | Status: DC
Start: 2015-06-16 — End: 2015-06-18
  Administered 2015-06-16 – 2015-06-18 (×7): 3.375 g via INTRAVENOUS
  Filled 2015-06-16 (×6): qty 50

## 2015-06-16 NOTE — Progress Notes (Signed)
Patient ID: Jamie [name], female   DOB: 1951-03-05, 64 y.o.   MRN: [id number]     [address] [place], [place] [address]    Phone: [phone] FAX: [phone]     Subjective: No n/v.  Passing flatus.  Feels constipated.  Afebrile.  VSS. WBC down to 10.2k.   Pain and pressure.  Objective:  Vital signs:  Filed Vitals:   06/15/15 1922 06/15/15 2111 06/16/15 0538 06/16/15 0622  BP: 105/60 106/58 94/55 103/52  Pulse: 104 95 79 82  Temp: 99.3 F (37.4 C) 98.6 F (37 C) 98.4 F (36.9 C)   TempSrc: Oral Oral Oral   Resp: _0 Height:  _1  (1.651 m)    Weight:  71.1 kg (156 lb 12 oz)    SpO2: 98% 100% 97%     Last BM Date: 06/14/15  Intake/Output   Yesterday:  11/02 0701 - 11/03 0700 In: 1237.5 [I.V.:1037.5; IV Piggyback:200] Out: 2 [Urine:2] This shift: I/O last 3 completed shifts: In: 1237.5 [I.V.:1037.5; IV Piggyback:200] Out: 2 [Urine:2]    Physical Exam: General: Pt awake/alert/oriented x4 in no acute distress Abdomen: Soft.  Nondistended.  Mod ttp to llq without guarding.  No evidence of peritonitis.  No incarcerated hernias.    Problem List:   Principal Problem:   Diverticulitis of intestine with abscess    Results:   Labs: Results for orders placed or performed during the hospital encounter of 06/15/15 (from the past 48 hour(s))  CBC     Status: Abnormal   Collection Time: 06/15/15  3:10 PM  Result Value Ref Range   WBC 13.0 (H) 4.0 - 10.5 K/uL   RBC 4.28 3.87 - 5.11 MIL/uL   Hemoglobin 13.2 12.0 - 15.0 g/dL   HCT 38.5 36.0 - 46.0 %   MCV 90.0 78.0 - 100.0 fL   MCH 30.8 26.0 - 34.0 pg   MCHC 34.3 30.0 - 36.0 g/dL   RDW 11.6 11.5 - 15.5 %   Platelets 308 150 - 400 K/uL  Comprehensive metabolic panel     Status: Abnormal   Collection Time: 06/15/15  3:10 PM  Result Value Ref Range   Sodium 135 135 - 145 mmol/L   Potassium 4.0 3.5 - 5.1 mmol/L   Chloride 102 101 - 111  mmol/L   CO2 26 22 - 32 mmol/L   Glucose, Bld 118 (H) 65 - 99 mg/dL   BUN 10 6 - 20 mg/dL   Creatinine, Ser 0.82 0.44 - 1.00 mg/dL   Calcium 8.8 (L) 8.9 - 10.3 mg/dL   Total Protein 7.8 6.5 - 8.1 g/dL   Albumin 4.1 3.5 - 5.0 g/dL   AST 20 15 - 41 U/L   ALT 18 14 - 54 U/L   Alkaline Phosphatase 56 38 - 126 U/L   Total Bilirubin 0.8 0.3 - 1.2 mg/dL   GFR calc non Af Amer >60 >60 mL/min   GFR calc Af Amer >60 >60 mL/min    Comment: (NOTE) The eGFR has been calculated using the CKD EPI equation. This calculation has not been validated in all clinical situations. eGFR's persistently <60 mL/min signify possible Chronic Kidney Disease.    Anion gap 7 5 - 15  Lipase, blood     Status: None   Collection Time: 06/15/15  3:10 PM  Result Value Ref Range   Lipase 24 11 - 51 U/L  Comment: Please note change in reference range.  Urinalysis, Routine w reflex microscopic (not at [facility])     Status: None   Collection Time: 06/15/15  4:50 PM  Result Value Ref Range   Color, Urine YELLOW YELLOW   APPearance CLEAR CLEAR   Specific Gravity, Urine 1.006 1.005 - 1.030   pH 6.0 5.0 - 8.0   Glucose, UA NEGATIVE NEGATIVE mg/dL   Hgb urine dipstick NEGATIVE NEGATIVE   Bilirubin Urine NEGATIVE NEGATIVE   Ketones, ur NEGATIVE NEGATIVE mg/dL   Protein, ur NEGATIVE NEGATIVE mg/dL   Urobilinogen, UA 0.2 0.0 - 1.0 mg/dL   Nitrite NEGATIVE NEGATIVE   Leukocytes, UA NEGATIVE NEGATIVE    Comment: MICROSCOPIC NOT DONE ON URINES WITH NEGATIVE PROTEIN, BLOOD, LEUKOCYTES, NITRITE, OR GLUCOSE <1000 mg/dL.  CBC     Status: Abnormal   Collection Time: 06/16/15  4:14 AM  Result Value Ref Range   WBC 10.2 4.0 - 10.5 K/uL   RBC 3.36 (L) 3.87 - 5.11 MIL/uL   Hemoglobin 10.3 (L) 12.0 - 15.0 g/dL   HCT 30.2 (L) 36.0 - 46.0 %   MCV 89.9 78.0 - 100.0 fL   MCH 30.7 26.0 - 34.0 pg   MCHC 34.1 30.0 - 36.0 g/dL   RDW 11.9 11.5 - 15.5 %   Platelets 246 150 - 400 K/uL  Basic metabolic panel     Status: Abnormal    Collection Time: 06/16/15  4:14 AM  Result Value Ref Range   Sodium 138 135 - 145 mmol/L   Potassium 3.7 3.5 - 5.1 mmol/L   Chloride 106 101 - 111 mmol/L   CO2 27 22 - 32 mmol/L   Glucose, Bld 101 (H) 65 - 99 mg/dL   BUN 9 6 - 20 mg/dL   Creatinine, Ser 0.76 0.44 - 1.00 mg/dL   Calcium 8.2 (L) 8.9 - 10.3 mg/dL   GFR calc non Af Amer >60 >60 mL/min   GFR calc Af Amer >60 >60 mL/min    Comment: (NOTE) The eGFR has been calculated using the CKD EPI equation. This calculation has not been validated in all clinical situations. eGFR's persistently <60 mL/min signify possible Chronic Kidney Disease.    Anion gap 5 5 - 15    Imaging / Studies: Dg Chest 2 View  06/15/2015  CLINICAL DATA:  Cough and congestion for a few days EXAM: CHEST  2 VIEW COMPARISON:  02/16/2015 FINDINGS: Normal heart size, mediastinal contours, and pulmonary vascularity. Lungs clear. No pneumothorax. Bones demineralized. IMPRESSION: No acute abnormalities. Electronically Signed   By: Lavonia Dana M.D.   On: 06/15/2015 17:35   Ct Abdomen Pelvis W Contrast  06/15/2015  CLINICAL DATA:  Left-sided abdominal pain with nausea and cough. EXAM: CT ABDOMEN AND PELVIS WITH CONTRAST TECHNIQUE: Multidetector CT imaging of the abdomen and pelvis was performed using the standard protocol following bolus administration of intravenous contrast. CONTRAST:  34m OMNIPAQUE IOHEXOL 300 MG/ML SOLN, 1019mOMNIPAQUE IOHEXOL 300 MG/ML SOLN COMPARISON:  PET 01/14/2016 and CT abdomen pelvis 02/20/2014. FINDINGS: Lower chest: Lung bases show no acute findings. Heart size normal. No pericardial or pleural effusion. Hepatobiliary: 5 mm low-attenuation lesion in the right hepatic lobe is unchanged and may represent a cyst or hamartoma. Liver and gallbladder are otherwise unremarkable. No biliary ductal dilatation. Pancreas: Negative. Spleen: Negative. Adrenals/Urinary Tract: Right ureter is decompressed. Left ureter is mildly dilated secondary to  inflammatory changes in the left anatomic pelvis. Bladder is grossly unremarkable. Stomach/Bowel: Stomach, small bowel, appendix  and majority of the colon are unremarkable. There is soft tissue thickening involving the proximal sigmoid colon with pericolonic stranding. A 2.7 cm fluid collection is seen along the posterior margin of the proximal sigmoid colon (series 2, image 64). No extraluminal air. Vascular/Lymphatic: Atherosclerotic calcification of the arterial vasculature is mild. No pathologically enlarged lymph nodes. Reproductive: Hysterectomy. Right ovary is likely visualized. Left ovary is not well visualized. Other: No free fluid.  Mesenteries and peritoneum are unremarkable. Musculoskeletal: No worrisome lytic or sclerotic lesions. IMPRESSION: 1. Sigmoid diverticulitis complicated by microperforation and small abscess. 2. Mild left ureteral dilatation is seen secondarily. Electronically Signed   By: Lorin Picket M.D.   On: 06/15/2015 17:47    Medications / Allergies:  Scheduled Meds: . ampicillin-sulbactam (UNASYN) IV  3 g Intravenous Q6H  . citalopram  10 mg Oral Daily  . heparin  5,000 Units Subcutaneous 3 times per day  . pantoprazole  80 mg Oral Q1200   Continuous Infusions: . sodium chloride 125 mL/hr at 06/16/15 0821   PRN Meds:.acetaminophen, ALPRAZolam, HYDROmorphone (DILAUDID) injection, ondansetron (ZOFRAN) IV, ondansetron, promethazine  Antibiotics: Anti-infectives    Start     Dose/Rate Route Frequency Ordered Stop   06/15/15 2200  Ampicillin-Sulbactam (UNASYN) 3 g in sodium chloride 0.9 % 100 mL IVPB     3 g 100 mL/hr over 60 Minutes Intravenous Every 6 hours 06/15/15 2128     06/15/15 1545  ampicillin-sulbactam (UNASYN) 1.5 g in sodium chloride 0.9 % 50 mL IVPB     1.5 g 100 mL/hr over 30 Minutes Intravenous  Once 06/15/15 1532 06/15/15 1648        Assessment/Plan Diverticulitis with microperforation and 2.7cm abscess -continue non operative management   -she will need a  ID-Unasyn, change to rocephin/flagyl FEN-NPO x ice chips, IVF, pain control Dispo-continue inpatient    Erby Pian, ANP-BC [facility] Pager [phone](7A-4:30P)   06/16/2015 8:26 AM

## 2015-06-16 NOTE — Progress Notes (Signed)
[facility] PROGRESS NOTE  Jamie [name] OBS:[id number] DOB: 02-Aug-1951 DOA: 06/15/2015 PCP: [name], PA-C  Length of stay: 1   Assessment/Plan: Principal Problem:   Diverticulitis of intestine with abscess Active Problems:   Sepsis ([place])    Brief summary 64 y.o. F with ~7 days of LLQ pain that has worsened over the last 3 days. This is associated with nausea and vomiting and subjective fevers. She presented to [facility] and was found to have diverticulitis with a microperforation.   Hospital course Acute diverticulitis with microperforation, 2.7 cm abscess Nonoperative management Continue on IV antibiotics switched to Unasyn to Zosyn White count improving Seen by Edsel Petrin. Dalbert Batman, M.D, he recommends ice chips today, clear liquids tomorrow, continue antibiotics,colonoscopy in about 6 weeks, and possible elective resection in about 3 months  Anxiety/depression continues  Xanax,, Celexa,  Dyslipidemia stable    DVT prophylaxsis heparin  Code Status:      Code Status Orders        Start     Ordered   06/15/15 2133  Full code   Continuous     06/15/15 2133     Family Communication: family updated about patient's clinical progress Disposition Plan:  Nothing by mouth for now, ice chips and meds only    Consultants:  General surgery  Procedures:  None  Antibiotics: Anti-infectives    Start     Dose/Rate Route Frequency Ordered Stop   06/16/15 1000  piperacillin-tazobactam (ZOSYN) IVPB 3.375 g     3.375 g 12.5 mL/hr over 240 Minutes Intravenous Every 8 hours 06/16/15 0947     06/15/15 2200  Ampicillin-Sulbactam (UNASYN) 3 g in sodium chloride 0.9 % 100 mL IVPB  Status:  Discontinued     3 g 100 mL/hr over 60 Minutes Intravenous Every 6 hours 06/15/15 2128 06/16/15 0913   06/15/15 1545  ampicillin-sulbactam (UNASYN) 1.5 g in sodium chloride 0.9 % 50 mL IVPB     1.5 g 100 mL/hr over 30 Minutes Intravenous  Once 06/15/15 1532 06/15/15 1648          HPI/Subjective: Minimal abdominal pain, denies nausea vomiting,  Objective: Filed Vitals:   06/15/15 2111 06/16/15 0538 06/16/15 0622 06/16/15 0838  BP: 106/58 94/55 103/52 94/48  Pulse: 95 79 82 72  Temp: 98.6 F (37 C) 98.4 F (36.9 C)    TempSrc: Oral Oral    Resp: 22 20    Height: 5\' 5"  (1.651 m)     Weight: 71.1 kg (156 lb 12 oz)     SpO2: 100% 97%      Intake/Output Summary (Last 24 hours) at 06/16/15 1041 Last data filed at 06/16/15 0600  Gross per 24 hour  Intake 1237.5 ml  Output      2 ml  Net 1235.5 ml    Exam:  General: No acute respiratory distress Lungs: Clear to auscultation bilaterally without wheezes or crackles Cardiovascular: Regular rate and rhythm without murmur gallop or rub normal S1 and S2 Abdomen: Nontender, nondistended, soft, bowel sounds positive, no rebound, no ascites, no appreciable mass Extremities: No significant cyanosis, clubbing, or edema bilateral lower extremities     Data Review   Micro Results Recent Results (from the past 240 hour(s))  Urine Culture     Status: None   Collection Time: 06/13/15  2:20 PM  Result Value Ref Range Status   Colony Count 25,000 COLONIES/ML  Final   Organism ID, Bacteria Multiple bacterial morphotypes present,  none  Final   Organism ID, Bacteria predominant. Suggest appropriate recollection if   Final   Organism ID, Bacteria clinically indicated.  Final  WET PREP FOR [place], YEAST, CLUE     Status: None   Collection Time: 06/13/15  2:35 PM  Result Value Ref Range Status   Yeast Wet Prep HPF POC NONE SEEN NONE SEEN Final   Trich, Wet Prep NONE SEEN NONE SEEN Final   Clue Cells Wet Prep HPF POC NONE SEEN NONE SEEN Final   WBC, Wet Prep HPF POC FEW NONE SEEN Final    Radiology Reports Dg Chest 2 View  06/15/2015  CLINICAL DATA:  Cough and congestion for a few days EXAM: CHEST  2 VIEW COMPARISON:  02/16/2015 FINDINGS: Normal heart size, mediastinal contours, and pulmonary vascularity.  Lungs clear. No pneumothorax. Bones demineralized. IMPRESSION: No acute abnormalities. Electronically Signed   By: Lavonia Dana M.D.   On: 06/15/2015 17:35   Ct Abdomen Pelvis W Contrast  06/15/2015  CLINICAL DATA:  Left-sided abdominal pain with nausea and cough. EXAM: CT ABDOMEN AND PELVIS WITH CONTRAST TECHNIQUE: Multidetector CT imaging of the abdomen and pelvis was performed using the standard protocol following bolus administration of intravenous contrast. CONTRAST:  67mL OMNIPAQUE IOHEXOL 300 MG/ML SOLN, 142mL OMNIPAQUE IOHEXOL 300 MG/ML SOLN COMPARISON:  PET 01/14/2016 and CT abdomen pelvis 02/20/2014. FINDINGS: Lower chest: Lung bases show no acute findings. Heart size normal. No pericardial or pleural effusion. Hepatobiliary: 5 mm low-attenuation lesion in the right hepatic lobe is unchanged and may represent a cyst or hamartoma. Liver and gallbladder are otherwise unremarkable. No biliary ductal dilatation. Pancreas: Negative. Spleen: Negative. Adrenals/Urinary Tract: Right ureter is decompressed. Left ureter is mildly dilated secondary to inflammatory changes in the left anatomic pelvis. Bladder is grossly unremarkable. Stomach/Bowel: Stomach, small bowel, appendix and majority of the colon are unremarkable. There is soft tissue thickening involving the proximal sigmoid colon with pericolonic stranding. A 2.7 cm fluid collection is seen along the posterior margin of the proximal sigmoid colon (series 2, image 64). No extraluminal air. Vascular/Lymphatic: Atherosclerotic calcification of the arterial vasculature is mild. No pathologically enlarged lymph nodes. Reproductive: Hysterectomy. Right ovary is likely visualized. Left ovary is not well visualized. Other: No free fluid.  Mesenteries and peritoneum are unremarkable. Musculoskeletal: No worrisome lytic or sclerotic lesions. IMPRESSION: 1. Sigmoid diverticulitis complicated by microperforation and small abscess. 2. Mild left ureteral dilatation is  seen secondarily. Electronically Signed   By: Lorin Picket M.D.   On: 06/15/2015 17:47     CBC  Recent Labs Lab 06/15/15 1510 06/16/15 0414  WBC 13.0* 10.2  HGB 13.2 10.3*  HCT 38.5 30.2*  PLT 308 246  MCV 90.0 89.9  MCH 30.8 30.7  MCHC 34.3 34.1  RDW 11.6 11.9    Chemistries   Recent Labs Lab 06/15/15 1510 06/16/15 0414  NA 135 138  K 4.0 3.7  CL 102 106  CO2 26 27  GLUCOSE 118* 101*  BUN 10 9  CREATININE 0.82 0.76  CALCIUM 8.8* 8.2*  AST 20  --   ALT 18  --   ALKPHOS 56  --   BILITOT 0.8  --    ------------------------------------------------------------------------------------------------------------------ estimated creatinine clearance is 72.1 mL/min (by C-G formula based on Cr of 0.76). ------------------------------------------------------------------------------------------------------------------ No results for input(s): HGBA1C in the last 72 hours. ------------------------------------------------------------------------------------------------------------------ No results for input(s): CHOL, HDL, LDLCALC, TRIG, CHOLHDL, LDLDIRECT in the last 72 hours. ------------------------------------------------------------------------------------------------------------------ No results for input(s): TSH, T4TOTAL, T3FREE, THYROIDAB in  the last 72 hours.  Invalid input(s): FREET3 ------------------------------------------------------------------------------------------------------------------ No results for input(s): VITAMINB12, FOLATE, FERRITIN, TIBC, IRON, RETICCTPCT in the last 72 hours.  Coagulation profile No results for input(s): INR, PROTIME in the last 168 hours.  No results for input(s): DDIMER in the last 72 hours.  Cardiac Enzymes No results for input(s): CKMB, TROPONINI, MYOGLOBIN in the last 168 hours.  Invalid input(s): CK ------------------------------------------------------------------------------------------------------------------ Invalid  input(s): POCBNP   CBG: No results for input(s): GLUCAP in the last 168 hours.     Studies: Dg Chest 2 View  06/15/2015  CLINICAL DATA:  Cough and congestion for a few days EXAM: CHEST  2 VIEW COMPARISON:  02/16/2015 FINDINGS: Normal heart size, mediastinal contours, and pulmonary vascularity. Lungs clear. No pneumothorax. Bones demineralized. IMPRESSION: No acute abnormalities. Electronically Signed   By: Lavonia Dana M.D.   On: 06/15/2015 17:35   Ct Abdomen Pelvis W Contrast  06/15/2015  CLINICAL DATA:  Left-sided abdominal pain with nausea and cough. EXAM: CT ABDOMEN AND PELVIS WITH CONTRAST TECHNIQUE: Multidetector CT imaging of the abdomen and pelvis was performed using the standard protocol following bolus administration of intravenous contrast. CONTRAST:  61mL OMNIPAQUE IOHEXOL 300 MG/ML SOLN, 14mL OMNIPAQUE IOHEXOL 300 MG/ML SOLN COMPARISON:  PET 01/14/2016 and CT abdomen pelvis 02/20/2014. FINDINGS: Lower chest: Lung bases show no acute findings. Heart size normal. No pericardial or pleural effusion. Hepatobiliary: 5 mm low-attenuation lesion in the right hepatic lobe is unchanged and may represent a cyst or hamartoma. Liver and gallbladder are otherwise unremarkable. No biliary ductal dilatation. Pancreas: Negative. Spleen: Negative. Adrenals/Urinary Tract: Right ureter is decompressed. Left ureter is mildly dilated secondary to inflammatory changes in the left anatomic pelvis. Bladder is grossly unremarkable. Stomach/Bowel: Stomach, small bowel, appendix and majority of the colon are unremarkable. There is soft tissue thickening involving the proximal sigmoid colon with pericolonic stranding. A 2.7 cm fluid collection is seen along the posterior margin of the proximal sigmoid colon (series 2, image 64). No extraluminal air. Vascular/Lymphatic: Atherosclerotic calcification of the arterial vasculature is mild. No pathologically enlarged lymph nodes. Reproductive: Hysterectomy. Right ovary is  likely visualized. Left ovary is not well visualized. Other: No free fluid.  Mesenteries and peritoneum are unremarkable. Musculoskeletal: No worrisome lytic or sclerotic lesions. IMPRESSION: 1. Sigmoid diverticulitis complicated by microperforation and small abscess. 2. Mild left ureteral dilatation is seen secondarily. Electronically Signed   By: Lorin Picket M.D.   On: 06/15/2015 17:47      Lab Results  Component Value Date   HGBA1C 5.6 12/21/2013   HGBA1C 5.7* 01/30/2012   Lab Results  Component Value Date   LDLCALC 157* 05/10/2015   CREATININE 0.76 06/16/2015       Scheduled Meds: . citalopram  10 mg Oral Daily  . heparin  5,000 Units Subcutaneous 3 times per day  . pantoprazole  80 mg Oral Q1200  . piperacillin-tazobactam (ZOSYN)  IV  3.375 g Intravenous Q8H   Continuous Infusions: . sodium chloride 125 mL/hr at 06/16/15 9622    Principal Problem:   Diverticulitis of intestine with abscess Active Problems:   Sepsis ([place])    Time spent: 81 minutes   [place] [facility] Pager [phone]. If 7PM-7AM, please contact night-coverage at [email or web address], password [facility] 06/16/2015, 10:41 AM  LOS: 1 day

## 2015-06-16 NOTE — Progress Notes (Signed)
ANTIBIOTIC CONSULT NOTE - INITIAL  Pharmacy Consult for Zosyn Indication: Intra-abdominal infection  Allergies  Allergen Reactions  . Ciprofloxacin     Facial numbness  . Peanut Butter Flavor Nausea And Vomiting    N/V  . Shellfish Allergy Swelling    Patient Measurements: Height: 5\' 5"  (165.1 cm) Weight: 156 lb 12 oz (71.1 kg) IBW/kg (Calculated) : 57   Vital Signs: Temp: 98.4 F (36.9 C) (11/03 0538) Temp Source: Oral (11/03 0538) BP: 94/48 mmHg (11/03 0838) Pulse Rate: 72 (11/03 0838) Intake/Output from previous day: 11/02 0701 - 11/03 0700 In: 1237.5 [I.V.:1037.5; IV Piggyback:200] Out: 2 [Urine:2]  Labs:  Recent Labs  06/15/15 1510 06/16/15 0414  WBC 13.0* 10.2  HGB 13.2 10.3*  PLT 308 246  CREATININE 0.82 0.76   Estimated Creatinine Clearance: 72.1 mL/min (by C-G formula based on Cr of 0.76).   Microbiology: Recent Results (from the past 720 hour(s))  Urine Culture     Status: None   Collection Time: 06/13/15  2:20 PM  Result Value Ref Range Status   Colony Count 25,000 COLONIES/ML  Final   Organism ID, Bacteria Multiple bacterial morphotypes present, none  Final   Organism ID, Bacteria predominant. Suggest appropriate recollection if   Final   Organism ID, Bacteria clinically indicated.  Final  WET PREP FOR [place], YEAST, CLUE     Status: None   Collection Time: 06/13/15  2:35 PM  Result Value Ref Range Status   Yeast Wet Prep HPF POC NONE SEEN NONE SEEN Final   Trich, Wet Prep NONE SEEN NONE SEEN Final   Clue Cells Wet Prep HPF POC NONE SEEN NONE SEEN Final   WBC, Wet Prep HPF POC FEW NONE SEEN Final    Medical History: Past Medical History  Diagnosis Date  . Anxiety   . Hyperlipidemia   . Diverticulosis   . IBS (irritable bowel syndrome)   . Depression   . Diverticulitis   . Headache   . Lung mass     Assessment: 78 yoF with hx IBS and diverticulosis presents with 1 week onset of abdominal pain, tachycardia, and fever despite oral  antibiotics (first flagyl, then augmentin).  CT a/p shows sigmoid diverticulitis complicated by microperforation and small abscess.  Pharmacy was initially consulted to start Unasyn, then broadened to Zosyn.  11/2 >> Unasyn >> 11/3 11/3 >> Zosyn >>    Today, 06/16/2015: Tm 100 WBC improved to WNL SCr stable with CrCl ~ 72 ml/min No cultures available.  Goal of Therapy:  Appropriate abx dosing, eradication of infection.   Plan:   Zosyn 3.375g IV Q8H infused over 4hrs.  Follow up renal function, cultures, and clinical condition.  Gretta Arab PharmD, BCPS Pager [phone] 06/16/2015 12:01 PM

## 2015-06-16 NOTE — Progress Notes (Signed)
Initial Nutrition Assessment  DOCUMENTATION CODES:   Not applicable  INTERVENTION:  - Diet advancement as medically feasible - RD will continue to monitor for needs  NUTRITION DIAGNOSIS:   Inadequate oral intake related to inability to eat as evidenced by NPO status  GOAL:   Patient will meet greater than or equal to 90% of their needs  MONITOR:   Diet advancement, Weight trends, Labs, I & O's  REASON FOR ASSESSMENT:   Malnutrition Screening Tool  ASSESSMENT:   64 y.o. female who presents to the ED with c/o 1 week history of generalized abdominal pain.Pain has gradually worsened over past week, is sharp, associated with fevers, chills, N/V.No dysuria. Work up in ED demonstrates diverticulitis with small abscess and microperforation.  Pt seen for MST. BMI indicates overweight status. Pt has been NPO and unable to meet needs.   Per surgery/surgery PA note this AM:  For now we plan to treat her with bowel rest and antibiotics in hopes of outpatient medical management, colonoscopy in about 6 weeks, and possible elective resection in about 3 months. I proposed this to her. She is thinking about this. Surgical decisions will be made later, obviously.  Pt reports she has had diverticular flares in the past and that this episode has been ongoing x1 week PTA. During that time she was having abdominal pain and nausea but only episode of emesis was yesterday evening. Due to pain and nausea she was eating very minimally x1 week but drinking fluids, mainly water, well.   Unable to obtain other information at this time as family came in to visit and pt was interested in talking with them and not RD at that time.   Noted bowel rest planned, but provided pt with low-fiber nutrition therapy handouts for when diet is able to be advanced. Also provided her with high-fiber nutrition therapy handouts for after flare has resolved and after (potential) surgery for long-term intervention when  flares are not occuring. Advised her and family that diet advancement would occur per surgery and to follow their orders.  Unable to complete physical assessment at this time. Per chart review, pt's weight has been stable (149-158 lbs) in the past 4 months.  Medications reviewed. Labs reviewed; Ca: 8.2 mg/dL.   Diet Order:  Diet NPO time specified Except for: Sips with Meds  Skin:  Reviewed, no issues  Last BM:  11/1  Height:   Ht Readings from Last 1 Encounters:  06/15/15 5\' 5"  (1.651 m)    Weight:   Wt Readings from Last 1 Encounters:  06/15/15 156 lb 12 oz (71.1 kg)    Ideal Body Weight:  56.82 kg (kg)  BMI:  Body mass index is 26.08 kg/(m^2).  Estimated Nutritional Needs:   Kcal:  1400-1600  Protein:  55-65 grams  Fluid:  2.2 L/day  EDUCATION NEEDS:   No education needs identified at this time      Jarome Matin, RD, LDN Inpatient Clinical Dietitian Pager # [phone] After hours/weekend pager # [phone]

## 2015-06-17 DIAGNOSIS — F4323 Adjustment disorder with mixed anxiety and depressed mood: Secondary | ICD-10-CM

## 2015-06-17 LAB — CBC
HEMATOCRIT: 30.9 % — AB (ref 36.0–46.0)
HEMOGLOBIN: 10.5 g/dL — AB (ref 12.0–15.0)
MCH: 30.9 pg (ref 26.0–34.0)
MCHC: 34 g/dL (ref 30.0–36.0)
MCV: 90.9 fL (ref 78.0–100.0)
Platelets: 266 10*3/uL (ref 150–400)
RBC: 3.4 MIL/uL — ABNORMAL LOW (ref 3.87–5.11)
RDW: 11.8 % (ref 11.5–15.5)
WBC: 5.7 10*3/uL (ref 4.0–10.5)

## 2015-06-17 LAB — COMPREHENSIVE METABOLIC PANEL
ALBUMIN: 2.9 g/dL — AB (ref 3.5–5.0)
ALK PHOS: 43 U/L (ref 38–126)
ALT: 19 U/L (ref 14–54)
AST: 25 U/L (ref 15–41)
Anion gap: 8 (ref 5–15)
BILIRUBIN TOTAL: 1.1 mg/dL (ref 0.3–1.2)
BUN: 13 mg/dL (ref 6–20)
CO2: 23 mmol/L (ref 22–32)
Calcium: 8.2 mg/dL — ABNORMAL LOW (ref 8.9–10.3)
Chloride: 107 mmol/L (ref 101–111)
Creatinine, Ser: 0.87 mg/dL (ref 0.44–1.00)
GFR calc Af Amer: >60 mL/min (ref >60)
GFR calc non Af Amer: >60 mL/min (ref >60)
GLUCOSE: 65 mg/dL (ref 65–99)
POTASSIUM: 4 mmol/L (ref 3.5–5.1)
Sodium: 138 mmol/L (ref 135–145)
TOTAL PROTEIN: 5.7 g/dL — AB (ref 6.5–8.1)

## 2015-06-17 MED ORDER — RISAQUAD PO CAPS
1.0000 | ORAL_CAPSULE | Freq: Every day | ORAL | Status: DC
  Administered 2015-06-17 – 2015-06-18 (×2): 1 via ORAL
  Filled 2015-06-17 (×2): qty 1

## 2015-06-17 MED ORDER — SODIUM CHLORIDE 0.9 % IV BOLUS (SEPSIS)
500.0000 mL | Freq: Once | INTRAVENOUS | Status: AC
  Administered 2015-06-17: 500 mL via INTRAVENOUS

## 2015-06-17 MED ORDER — ALPRAZOLAM 0.25 MG PO TABS
0.2500 mg | ORAL_TABLET | Freq: Once | ORAL | Status: AC
  Administered 2015-06-17: 0.25 mg via ORAL
  Filled 2015-06-17: qty 1

## 2015-06-17 NOTE — Progress Notes (Signed)
[facility] PROGRESS NOTE  Jamie [name] TZG:[id number] DOB: [birth year] DOA: 06/15/2015 PCP: Jamie Planas, PA-C  Assessment/Plan: 64 y/o female with PMH of IBS, diverticulitis is admitted with diverticulitis with microperforation   1. Acute diverticulitis with microperforation. CT abdomen: Sigmoid diverticulitis complicated by microperforation and small abscess. H/o diverticulitis -patient is on IV atx, mild fever last night. Cont IV atx, antiemetics, [pain control. Full liquid diet. appreciate surgery input. Needs outpatient colonoscopy    2. Anxiety. On celexa, xanax    Code Status: full Family Communication: d/w patient, RN (indicate person spoken with, relationship, and if by phone, the number) Disposition Plan: home 24-48 hrs    Consultants:  Surgery   Procedures:  none  Antibiotics:  Zosyn 11/2>>>> (indicate start date, and stop date if known)  HPI/Subjective: Alert. No distress   Objective: Filed Vitals:   06/17/15 0515  BP: 111/56  Pulse: 76  Temp: 98 F (36.7 C)  Resp: 20    Intake/Output Summary (Last 24 hours) at 06/17/15 0844 Last data filed at 06/17/15 0600  Gross per 24 hour  Intake   3150 ml  Output      1 ml  Net   3149 ml   Filed Weights   06/15/15 1422 06/15/15 2111  Weight: 69.854 kg (154 lb) 71.1 kg (156 lb 12 oz)    Exam:   General:  No distress   Cardiovascular: s1,s2 rrr  Respiratory: CTA BL  Abdomen: soft, mild tender, no rebound   Musculoskeletal: no leg edema    Data Reviewed: Basic Metabolic Panel:  Recent Labs Lab 06/15/15 1510 06/16/15 0414 06/17/15 0517  NA 135 138 138  K 4.0 3.7 4.0  CL 102 106 107  CO2 26 27 23   GLUCOSE 118* 101* 65  BUN 10 9 13   CREATININE 0.82 0.76 0.87  CALCIUM 8.8* 8.2* 8.2*   Liver Function Tests:  Recent Labs Lab 06/15/15 1510 06/17/15 0517  AST 20 25  ALT 18 19  ALKPHOS 56 43  BILITOT 0.8 1.1  PROT 7.8 5.7*  ALBUMIN 4.1 2.9*    Recent Labs Lab  06/15/15 1510  LIPASE 24   No results for input(s): AMMONIA in the last 168 hours. CBC:  Recent Labs Lab 06/15/15 1510 06/16/15 0414 06/17/15 0517  WBC 13.0* 10.2 5.7  HGB 13.2 10.3* 10.5*  HCT 38.5 30.2* 30.9*  MCV 90.0 89.9 90.9  PLT 308 246 266   Cardiac Enzymes: No results for input(s): CKTOTAL, CKMB, CKMBINDEX, TROPONINI in the last 168 hours. BNP (last 3 results) No results for input(s): BNP in the last 8760 hours.  ProBNP (last 3 results) No results for input(s): PROBNP in the last 8760 hours.  CBG: No results for input(s): GLUCAP in the last 168 hours.  Recent Results (from the past 240 hour(s))  Urine Culture     Status: None   Collection Time: 06/13/15  2:20 PM  Result Value Ref Range Status   Colony Count 25,000 COLONIES/ML  Final   Organism ID, Bacteria Multiple bacterial morphotypes present, none  Final   Organism ID, Bacteria predominant. Suggest appropriate recollection if   Final   Organism ID, Bacteria clinically indicated.  Final  WET PREP FOR [place], YEAST, CLUE     Status: None   Collection Time: 06/13/15  2:35 PM  Result Value Ref Range Status   Yeast Wet Prep HPF POC NONE SEEN NONE SEEN Final   Trich, Wet Prep NONE SEEN NONE SEEN Final   Clue Cells  Wet Prep HPF POC NONE SEEN NONE SEEN Final   WBC, Wet Prep HPF POC FEW NONE SEEN Final     Studies: Dg Chest 2 View  06/15/2015  CLINICAL DATA:  Cough and congestion for a few days EXAM: CHEST  2 VIEW COMPARISON:  02/16/2015 FINDINGS: Normal heart size, mediastinal contours, and pulmonary vascularity. Lungs clear. No pneumothorax. Bones demineralized. IMPRESSION: No acute abnormalities. Electronically Signed   By: Lavonia Dana M.D.   On: 06/15/2015 17:35   Ct Abdomen Pelvis W Contrast  06/15/2015  CLINICAL DATA:  Left-sided abdominal pain with nausea and cough. EXAM: CT ABDOMEN AND PELVIS WITH CONTRAST TECHNIQUE: Multidetector CT imaging of the abdomen and pelvis was performed using the standard  protocol following bolus administration of intravenous contrast. CONTRAST:  67mL OMNIPAQUE IOHEXOL 300 MG/ML SOLN, 131mL OMNIPAQUE IOHEXOL 300 MG/ML SOLN COMPARISON:  PET 01/14/2016 and CT abdomen pelvis 02/20/2014. FINDINGS: Lower chest: Lung bases show no acute findings. Heart size normal. No pericardial or pleural effusion. Hepatobiliary: 5 mm low-attenuation lesion in the right hepatic lobe is unchanged and may represent a cyst or hamartoma. Liver and gallbladder are otherwise unremarkable. No biliary ductal dilatation. Pancreas: Negative. Spleen: Negative. Adrenals/Urinary Tract: Right ureter is decompressed. Left ureter is mildly dilated secondary to inflammatory changes in the left anatomic pelvis. Bladder is grossly unremarkable. Stomach/Bowel: Stomach, small bowel, appendix and majority of the colon are unremarkable. There is soft tissue thickening involving the proximal sigmoid colon with pericolonic stranding. A 2.7 cm fluid collection is seen along the posterior margin of the proximal sigmoid colon (series 2, image 64). No extraluminal air. Vascular/Lymphatic: Atherosclerotic calcification of the arterial vasculature is mild. No pathologically enlarged lymph nodes. Reproductive: Hysterectomy. Right ovary is likely visualized. Left ovary is not well visualized. Other: No free fluid.  Mesenteries and peritoneum are unremarkable. Musculoskeletal: No worrisome lytic or sclerotic lesions. IMPRESSION: 1. Sigmoid diverticulitis complicated by microperforation and small abscess. 2. Mild left ureteral dilatation is seen secondarily. Electronically Signed   By: Lorin Picket M.D.   On: 06/15/2015 17:47    Scheduled Meds: . docusate sodium  100 mg Oral BID  . heparin  5,000 Units Subcutaneous 3 times per day  . pantoprazole  80 mg Oral Q1200  . piperacillin-tazobactam (ZOSYN)  IV  3.375 g Intravenous Q8H   Continuous Infusions: . sodium chloride 125 mL/hr at 06/17/15 0005    Principal Problem:    Diverticulitis of intestine with abscess Active Problems:   Sepsis ([name])   Diverticulitis of large intestine with abscess without bleeding    Time spent: >35 minutes     Kinnie Feil  [facility] Pager [phone]. If 7PM-7AM, please contact night-coverage at [email or web address], password [facility] 06/17/2015, 8:44 AM  LOS: 2 days

## 2015-06-17 NOTE — Progress Notes (Addendum)
Subjective: Patient states that pain is better, she is hungry, and has had 2 bowel movements. She states that she is drinking broth and Jell-O, although diet order is nothing by mouth. Low-grade fever to 100.8 yesterday.  Afebrile since. WBC had normalized yesterday.  Today's labs are pending.  She has lots of questions.  She wanted to know if she was going home today and I advised against that.  We had a long talk about algorithms for care.  I advised her that because of her recurrent bouts of diverticulitis, now complicated by a microperforation, and possible stricture that she would do a colonoscopy in 6-8 weeks, and would most likely recommend surgical resection in 3 months if her symptoms resolve with antibiotics.  Follow-up CT in 2-3 days if symptoms do not resolve.   Objective: Vital signs in last 24 hours: Temp:  [97.8 F (36.6 C)-100.8 F (38.2 C)] 98.4 F (36.9 C) (11/04 0030) Pulse Rate:  [72-86] 86 (11/03 1947) Resp:  [18-20] 20 (11/03 1947) BP: (91-110)/(48-59) 110/59 mmHg (11/04 0146) SpO2:  [96 %-98 %] 98 % (11/03 1947) Last BM Date: 06/14/15  Intake/Output from previous day: 11/03 0701 - 11/04 0700 In: 2275 [I.V.:2125; IV Piggyback:150] Out: 1 [Urine:1] Intake/Output this shift: Total I/O In: 1100 [I.V.:1000; IV Piggyback:100] Out: -   General appearance: Alert.  Cooperative.  Lots of questions.  Doesn't appear to be in any distress.  One of her friends is in the room. Resp: clear to auscultation bilaterally GI: Tender left lower quadrant with a little guarding.  Nondistended.  No mass.  Pfannenstiel incision well-healed.  Lab Results:   Recent Labs  06/15/15 1510 06/16/15 0414  WBC 13.0* 10.2  HGB 13.2 10.3*  HCT 38.5 30.2*  PLT 308 246   BMET  Recent Labs  06/15/15 1510 06/16/15 0414  NA 135 138  K 4.0 3.7  CL 102 106  CO2 26 27  GLUCOSE 118* 101*  BUN 10 9  CREATININE 0.82 0.76  CALCIUM 8.8* 8.2*   PT/INR No results for input(s):  LABPROT, INR in the last 72 hours. ABG No results for input(s): PHART, HCO3 in the last 72 hours.  Invalid input(s): PCO2, PO2  Studies/Results: Dg Chest 2 View  06/15/2015  CLINICAL DATA:  Cough and congestion for a few days EXAM: CHEST  2 VIEW COMPARISON:  02/16/2015 FINDINGS: Normal heart size, mediastinal contours, and pulmonary vascularity. Lungs clear. No pneumothorax. Bones demineralized. IMPRESSION: No acute abnormalities. Electronically Signed   By: Lavonia Dana M.D.   On: 06/15/2015 17:35   Ct Abdomen Pelvis W Contrast  06/15/2015  CLINICAL DATA:  Left-sided abdominal pain with nausea and cough. EXAM: CT ABDOMEN AND PELVIS WITH CONTRAST TECHNIQUE: Multidetector CT imaging of the abdomen and pelvis was performed using the standard protocol following bolus administration of intravenous contrast. CONTRAST:  41mL OMNIPAQUE IOHEXOL 300 MG/ML SOLN, 156mL OMNIPAQUE IOHEXOL 300 MG/ML SOLN COMPARISON:  PET 01/14/2016 and CT abdomen pelvis 02/20/2014. FINDINGS: Lower chest: Lung bases show no acute findings. Heart size normal. No pericardial or pleural effusion. Hepatobiliary: 5 mm low-attenuation lesion in the right hepatic lobe is unchanged and may represent a cyst or hamartoma. Liver and gallbladder are otherwise unremarkable. No biliary ductal dilatation. Pancreas: Negative. Spleen: Negative. Adrenals/Urinary Tract: Right ureter is decompressed. Left ureter is mildly dilated secondary to inflammatory changes in the left anatomic pelvis. Bladder is grossly unremarkable. Stomach/Bowel: Stomach, small bowel, appendix and majority of the colon are unremarkable. There is soft tissue thickening involving  the proximal sigmoid colon with pericolonic stranding. A 2.7 cm fluid collection is seen along the posterior margin of the proximal sigmoid colon (series 2, image 64). No extraluminal air. Vascular/Lymphatic: Atherosclerotic calcification of the arterial vasculature is mild. No pathologically enlarged lymph  nodes. Reproductive: Hysterectomy. Right ovary is likely visualized. Left ovary is not well visualized. Other: No free fluid.  Mesenteries and peritoneum are unremarkable. Musculoskeletal: No worrisome lytic or sclerotic lesions. IMPRESSION: 1. Sigmoid diverticulitis complicated by microperforation and small abscess. 2. Mild left ureteral dilatation is seen secondarily. Electronically Signed   By: Lorin Picket M.D.   On: 06/15/2015 17:47    Anti-infectives: Anti-infectives    Start     Dose/Rate Route Frequency Ordered Stop   06/16/15 1000  piperacillin-tazobactam (ZOSYN) IVPB 3.375 g     3.375 g 12.5 mL/hr over 240 Minutes Intravenous Every 8 hours 06/16/15 0947     06/15/15 2200  Ampicillin-Sulbactam (UNASYN) 3 g in sodium chloride 0.9 % 100 mL IVPB  Status:  Discontinued     3 g 100 mL/hr over 60 Minutes Intravenous Every 6 hours 06/15/15 2128 06/16/15 0913   06/15/15 1545  ampicillin-sulbactam (UNASYN) 1.5 g in sodium chloride 0.9 % 50 mL IVPB     1.5 g 100 mL/hr over 30 Minutes Intravenous  Once 06/15/15 1532 06/15/15 1648      Assessment/Plan:   Recurrent acute diverticulitis with microperforation and 2.7 cm pericolonic  fluid collection and possible stricture by CT ([place] I) Improving but still somewhat tender Bowel function has returned by history Continue antibiotics Full liquid diet If symptoms resolve, advise colonoscopy in 6-8 weeks and would offer elective sigmoid colectomy in about 3 months.  History abdominal hysterectomy  DVT prophylaxis.  Subcutaneous heparin.  Anxiety and depression.  Takes Xanax.    LOS: 2 days    Jamie [name] M 06/17/2015

## 2015-06-18 ENCOUNTER — Encounter (HOSPITAL_COMMUNITY): Payer: Self-pay | Admitting: Internal Medicine

## 2015-06-18 DIAGNOSIS — D649 Anemia, unspecified: Secondary | ICD-10-CM | POA: Diagnosis present

## 2015-06-18 DIAGNOSIS — F418 Other specified anxiety disorders: Secondary | ICD-10-CM

## 2015-06-18 DIAGNOSIS — A419 Sepsis, unspecified organism: Principal | ICD-10-CM

## 2015-06-18 DIAGNOSIS — K5730 Diverticulosis of large intestine without perforation or abscess without bleeding: Secondary | ICD-10-CM

## 2015-06-18 DIAGNOSIS — K5720 Diverticulitis of large intestine with perforation and abscess without bleeding: Secondary | ICD-10-CM

## 2015-06-18 HISTORY — DX: Other specified anxiety disorders: F41.8

## 2015-06-18 HISTORY — DX: Anemia, unspecified: D64.9

## 2015-06-18 MED ORDER — TRAMADOL HCL 50 MG PO TABS: 50.0000 mg | ORAL_TABLET | Freq: Four times a day (QID) | ORAL | Status: DC | PRN

## 2015-06-18 MED ORDER — SACCHAROMYCES BOULARDII 250 MG PO CAPS: 250.0000 mg | ORAL_CAPSULE | Freq: Two times a day (BID) | ORAL | Status: DC

## 2015-06-18 MED ORDER — ALPRAZOLAM 0.25 MG PO TABS: ORAL_TABLET | ORAL | Status: DC

## 2015-06-18 MED ORDER — ESOMEPRAZOLE MAGNESIUM 40 MG PO CPDR: 40.0000 mg | DELAYED_RELEASE_CAPSULE | Freq: Every day | ORAL | Status: DC | PRN

## 2015-06-18 MED ORDER — AMOXICILLIN-POT CLAVULANATE 875-125 MG PO TABS: 1.0000 | ORAL_TABLET | Freq: Two times a day (BID) | ORAL | Status: DC

## 2015-06-18 MED ORDER — ENOXAPARIN SODIUM 40 MG/0.4ML ~~LOC~~ SOLN
40.0000 mg | SUBCUTANEOUS | Status: DC
Start: 2015-06-18 — End: 2015-06-18
  Administered 2015-06-18: 40 mg via SUBCUTANEOUS
  Filled 2015-06-18: qty 0.4

## 2015-06-18 NOTE — Progress Notes (Signed)
  Subjective: Patient states that pain is better, she is hungry, and has had 2 bowel movements. She is tolerating a FLD WBC had normalized      Objective: Vital signs in last 24 hours: Temp:  [98.2 F (36.8 C)-98.5 F (36.9 C)] 98.5 F (36.9 C) (11/05 0629) Pulse Rate:  [79-90] 84 (11/05 0629) Resp:  [18-20] 20 (11/05 0629) BP: (115-117)/(55-84) 117/69 mmHg (11/05 0629) SpO2:  [96 %-100 %] 98 % (11/05 0629) Last BM Date: 06/17/15  Intake/Output from previous day: 11/04 0701 - 11/05 0700 In: 3325.8 [P.O.:1180; I.V.:1995.8; IV Piggyback:150] Out: 200 [Urine:200] Intake/Output this shift:    General appearance: Alert.  Cooperative.  Lots of questions.   Resp: clear to auscultation bilaterally GI: Tender left lower quadrant with a little guarding.  Nondistended.  No mass.  Pfannenstiel incision well-healed.  Lab Results:   Recent Labs  06/16/15 0414 06/17/15 0517  WBC 10.2 5.7  HGB 10.3* 10.5*  HCT 30.2* 30.9*  PLT 246 266   BMET  Recent Labs  06/16/15 0414 06/17/15 0517  NA 138 138  K 3.7 4.0  CL 106 107  CO2 27 23  GLUCOSE 101* 65  BUN 9 13  CREATININE 0.76 0.87  CALCIUM 8.2* 8.2*   PT/INR No results for input(s): LABPROT, INR in the last 72 hours. ABG No results for input(s): PHART, HCO3 in the last 72 hours.  Invalid input(s): PCO2, PO2  Studies/Results: No results found.  Anti-infectives: Anti-infectives    Start     Dose/Rate Route Frequency Ordered Stop   06/16/15 1000  piperacillin-tazobactam (ZOSYN) IVPB 3.375 g     3.375 g 12.5 mL/hr over 240 Minutes Intravenous Every 8 hours 06/16/15 0947     06/15/15 2200  Ampicillin-Sulbactam (UNASYN) 3 g in sodium chloride 0.9 % 100 mL IVPB  Status:  Discontinued     3 g 100 mL/hr over 60 Minutes Intravenous Every 6 hours 06/15/15 2128 06/16/15 0913   06/15/15 1545  ampicillin-sulbactam (UNASYN) 1.5 g in sodium chloride 0.9 % 50 mL IVPB     1.5 g 100 mL/hr over 30 Minutes Intravenous  Once  06/15/15 1532 06/15/15 1648      Assessment/Plan:   Recurrent acute diverticulitis with microperforation and 2.7 cm pericolonic  fluid collection and possible stricture by CT ([place] I) Improving with some very localized tenderness Bowel function has returned to normal Continue antibiotics, can switch to PO meds Advance to soft diet If symptoms resolve, advise colonoscopy in 6-8 weeks and would offer elective sigmoid colectomy in about 3 months. Ok to d/c from my standpoint  History abdominal hysterectomy  DVT prophylaxis.  Subcutaneous heparin.  Anxiety and depression.  Takes Xanax.    LOS: 3 days    Valree Feild C. 96/09/8364

## 2015-06-18 NOTE — Discharge Summary (Signed)
Physician Discharge Summary  Jamie [name]:[id number] DOB: 1950-11-18 DOA: 06/15/2015  PCP: [name], PA-C  Admit date: 06/15/2015 Discharge date: 06/18/2015   Recommendations for Outpatient Follow-Up:   1. The patient has been advised to follow-up with her gastroenterologist in 6 weeks for a colonoscopy.   Discharge Diagnosis:   Principal Problem:    Sepsis ([place]) secondary to diverticulitis of the large intestine with abscess, without bleeding/history of diverticulosis of the large intestine Active Problems:    Depression with anxiety    Normocytic anemia   Discharge disposition:  Home.    Discharge Condition: Improved.  Diet recommendation: Regular.   History of Present Illness:   Jamie [name] is an 64 y.o. female with a PMH of diverticulosis and IBS who was admitted 06/15/15 with diverticulitis and microperforation.  Hospital Course by Problem:   Principal Problem:  Sepsis ([place]) secondary to diverticulitis of intestine with abscess without bleeding / diverticulosis - Being followed by surgeon. WBC normalized on empiric antibiotics (Zosyn). Discharge home on 10 additional days of Augmentin. - Needs colonoscopy in 6-8 weeks. - Follow-up CT in 1-2 days if symptoms do not resolve. - Diet advanced to full liquids on 06/17/15.  Active Problems:  Depression and anxiety - Currently being managed only with Xanax.   Normocytic anemia - Outpatient colonoscopy recommended.    Medical Consultants:    Leighton Ruff, Surgery   Discharge Exam:   Filed Vitals:   06/18/15 0629  BP: 117/69  Pulse: 84  Temp: 98.5 F (36.9 C)  Resp: 20   Filed Vitals:   06/17/15 0515 06/17/15 1404 06/17/15 2253 06/18/15 0629  BP: 111/56 116/55 115/84 117/69  Pulse: 76 90 79 84  Temp: 98 F (36.7 C) 98.2 F (36.8 C) 98.3 F (36.8 C) 98.5 F (36.9 C)  TempSrc: Oral Oral Oral Oral  Resp: 20 18 20 20   Height:      Weight:      SpO2: 100% 100% 96% 98%    Gen:   NAD Cardiovascular:  RRR, No M/R/G Respiratory: Lungs CTAB Gastrointestinal: Abdomen soft, NT/ND with normal active bowel sounds. Extremities: No C/E/C   The results of significant diagnostics from this hospitalization (including imaging, microbiology, ancillary and laboratory) are listed below for reference.     Procedures and Diagnostic Studies:   Dg Chest 2 View  06/15/2015  CLINICAL DATA:  Cough and congestion for a few days EXAM: CHEST  2 VIEW COMPARISON:  02/16/2015 FINDINGS: Normal heart size, mediastinal contours, and pulmonary vascularity. Lungs clear. No pneumothorax. Bones demineralized. IMPRESSION: No acute abnormalities. Electronically Signed   By: Lavonia Dana M.D.   On: 06/15/2015 17:35   Ct Abdomen Pelvis W Contrast  06/15/2015  CLINICAL DATA:  Left-sided abdominal pain with nausea and cough. EXAM: CT ABDOMEN AND PELVIS WITH CONTRAST TECHNIQUE: Multidetector CT imaging of the abdomen and pelvis was performed using the standard protocol following bolus administration of intravenous contrast. CONTRAST:  10mL OMNIPAQUE IOHEXOL 300 MG/ML SOLN, 166mL OMNIPAQUE IOHEXOL 300 MG/ML SOLN COMPARISON:  PET 01/14/2016 and CT abdomen pelvis 02/20/2014. FINDINGS: Lower chest: Lung bases show no acute findings. Heart size normal. No pericardial or pleural effusion. Hepatobiliary: 5 mm low-attenuation lesion in the right hepatic lobe is unchanged and may represent a cyst or hamartoma. Liver and gallbladder are otherwise unremarkable. No biliary ductal dilatation. Pancreas: Negative. Spleen: Negative. Adrenals/Urinary Tract: Right ureter is decompressed. Left ureter is mildly dilated secondary to inflammatory changes in the left anatomic pelvis. Bladder is  grossly unremarkable. Stomach/Bowel: Stomach, small bowel, appendix and majority of the colon are unremarkable. There is soft tissue thickening involving the proximal sigmoid colon with pericolonic stranding. A 2.7 cm fluid collection is seen along  the posterior margin of the proximal sigmoid colon (series 2, image 64). No extraluminal air. Vascular/Lymphatic: Atherosclerotic calcification of the arterial vasculature is mild. No pathologically enlarged lymph nodes. Reproductive: Hysterectomy. Right ovary is likely visualized. Left ovary is not well visualized. Other: No free fluid.  Mesenteries and peritoneum are unremarkable. Musculoskeletal: No worrisome lytic or sclerotic lesions. IMPRESSION: 1. Sigmoid diverticulitis complicated by microperforation and small abscess. 2. Mild left ureteral dilatation is seen secondarily. Electronically Signed   By: Lorin Picket M.D.   On: 06/15/2015 17:47     Labs:   Basic Metabolic Panel:  Recent Labs Lab 06/15/15 1510 06/16/15 0414 06/17/15 0517  NA 135 138 138  K 4.0 3.7 4.0  CL 102 106 107  CO2 26 27 23   GLUCOSE 118* 101* 65  BUN 10 9 13   CREATININE 0.82 0.76 0.87  CALCIUM 8.8* 8.2* 8.2*   GFR Estimated Creatinine Clearance: 66.3 mL/min (by C-G formula based on Cr of 0.87). Liver Function Tests:  Recent Labs Lab 06/15/15 1510 06/17/15 0517  AST 20 25  ALT 18 19  ALKPHOS 56 43  BILITOT 0.8 1.1  PROT 7.8 5.7*  ALBUMIN 4.1 2.9*    Recent Labs Lab 06/15/15 1510  LIPASE 24   CBC:  Recent Labs Lab 06/15/15 1510 06/16/15 0414 06/17/15 0517  WBC 13.0* 10.2 5.7  HGB 13.2 10.3* 10.5*  HCT 38.5 30.2* 30.9*  MCV 90.0 89.9 90.9  PLT 308 246 266   Microbiology Recent Results (from the past 240 hour(s))  Urine Culture     Status: None   Collection Time: 06/13/15  2:20 PM  Result Value Ref Range Status   Colony Count 25,000 COLONIES/ML  Final   Organism ID, Bacteria Multiple bacterial morphotypes present, none  Final   Organism ID, Bacteria predominant. Suggest appropriate recollection if   Final   Organism ID, Bacteria clinically indicated.  Final  WET PREP FOR [place], YEAST, CLUE     Status: None   Collection Time: 06/13/15  2:35 PM  Result Value Ref Range Status    Yeast Wet Prep HPF POC NONE SEEN NONE SEEN Final   Trich, Wet Prep NONE SEEN NONE SEEN Final   Clue Cells Wet Prep HPF POC NONE SEEN NONE SEEN Final   WBC, Wet Prep HPF POC FEW NONE SEEN Final     Discharge Instructions:       Discharge Instructions    Call MD for:  extreme fatigue    Complete by:  As directed      Call MD for:  persistant nausea and vomiting    Complete by:  As directed      Call MD for:  severe uncontrolled pain    Complete by:  As directed      Call MD for:  temperature >100.4    Complete by:  As directed      Call MD for:    Complete by:  As directed   Blood in the stools.     Diet general    Complete by:  As directed      Increase activity slowly    Complete by:  As directed             Medication List    TAKE these medications  ALOE VERA PO  Take 1 Dose by mouth 2 (two) times daily. 3oz twice daily     ALPRAZolam 0.25 MG tablet  Commonly known as:  XANAX  TAKE 1 TABLET BY MOUTH AT BEDTIME AS NEEDED FOR ANXIETY OR SLEEP     amoxicillin-clavulanate 875-125 MG tablet  Commonly known as:  AUGMENTIN  Take 1 tablet by mouth 2 (two) times daily. 10 day therapy course patient began on 06/13/15.     esomeprazole 40 MG capsule  Commonly known as:  NEXIUM  Take 1 capsule (40 mg total) by mouth daily as needed (for acid reflux).     hydroxypropyl methylcellulose / hypromellose 2.5 % ophthalmic solution  Commonly known as:  ISOPTO TEARS / GONIOVISC  Place 1 drop into both eyes 3 (three) times daily as needed for dry eyes.     saccharomyces boulardii 250 MG capsule  Commonly known as:  FLORASTOR  Take 1 capsule (250 mg total) by mouth 2 (two) times daily.     traMADol 50 MG tablet  Commonly known as:  ULTRAM  Take 1 tablet (50 mg total) by mouth every 6 (six) hours as needed. May break in half.       Follow-up Information    Follow up with Rosario Adie., MD. Schedule an appointment as soon as possible for a visit in 4 weeks.    Specialty:  General Surgery   Contact information:   [address] [place] [address] [phone]       Schedule an appointment as soon as possible for a visit with Milus Banister, MD.   Specialty:  Gastroenterology   Why:  for colonoscopy in 6 weeks   Contact information:   [address] [place] [address] [phone]       Schedule an appointment as soon as possible for a visit with [name], PA-C.   Specialty:  Family Medicine   Why:  If symptoms worsen   Contact information:   [place]  [place] [address] [phone]        Time coordinating discharge: 25 minutes.  Signed:  Ajmal Kathan  Pager [phone] [facility] 06/18/2015, 3:43 PM

## 2015-06-23 ENCOUNTER — Telehealth: Payer: Self-pay | Admitting: Gastroenterology

## 2015-06-23 ENCOUNTER — Telehealth: Payer: Self-pay

## 2015-06-23 NOTE — Telephone Encounter (Signed)
We were not involved in her recent hosp stay.  I think it is best that she follow up with Dr. Marcello Moores.  Please put her on for rov in 6 weeks with me, agree that she should have pre-resection colonoscopy if she recovers from her acute illness.

## 2015-06-23 NOTE — Telephone Encounter (Signed)
Patient states she now has swelling in her stomach. Is this normal for diverticulosis? Please advise.

## 2015-06-23 NOTE — Telephone Encounter (Signed)
Appt has been made for 08/24/15 9 am.  Letter mailed with appt time and date .

## 2015-06-23 NOTE — Telephone Encounter (Signed)
Dr Ardis Hughs I spoke to the pt and she states she has continued to have abd pain and swelling, she states she wanted you to do her colon resection that Dr Marcello Moores has recommended in 3 months if she recovers well.  I advised her that Dr Marcello Moores was her surgeon and that you were not a surgeon and would be the MD that performs her requested colonoscopy in 6-8 weeks.  Reviewing the notes from her hospital admission it states if symptoms do not improve she would need a CT scan. The pt has also placed a call to Dr Marcello Moores, do you want to wait and see if Dr Marcello Moores is ordering the CT or should I schedule?  Please advise.

## 2015-06-24 NOTE — Telephone Encounter (Signed)
The feeling of swelling is likely bloating from actual infection. Bloating is normal. Is stomach warm to touch? When is GI appt?

## 2015-06-24 NOTE — Telephone Encounter (Signed)
January the 11 th. Maybe we could call to see if she could be seen sooner.

## 2015-06-24 NOTE — Telephone Encounter (Signed)
Patient advised. Will call on Monday for an earlier appointment.

## 2015-06-24 NOTE — Telephone Encounter (Signed)
Yes see if we can get sooner. Bloating/swelling is likely gut healing from diverticulitis. Red flag signs would be fever, blood in stool, worsening abdominal pain, vomiting.

## 2015-06-28 NOTE — Telephone Encounter (Signed)
Patient scheduled for Thursday 9:45 with the PA [place].

## 2015-06-30 ENCOUNTER — Other Ambulatory Visit: Payer: Self-pay | Admitting: *Deleted

## 2015-06-30 ENCOUNTER — Other Ambulatory Visit (INDEPENDENT_AMBULATORY_CARE_PROVIDER_SITE_OTHER): Payer: Medicaid Other

## 2015-06-30 ENCOUNTER — Encounter: Payer: Self-pay | Admitting: Physician Assistant

## 2015-06-30 ENCOUNTER — Ambulatory Visit (INDEPENDENT_AMBULATORY_CARE_PROVIDER_SITE_OTHER): Payer: Medicaid Other | Admitting: Physician Assistant

## 2015-06-30 VITALS — BP 100/70 | HR 80 | Ht 65.0 in | Wt 150.4 lb

## 2015-06-30 DIAGNOSIS — R300 Dysuria: Secondary | ICD-10-CM | POA: Diagnosis not present

## 2015-06-30 DIAGNOSIS — K5732 Diverticulitis of large intestine without perforation or abscess without bleeding: Secondary | ICD-10-CM

## 2015-06-30 DIAGNOSIS — R7989 Other specified abnormal findings of blood chemistry: Secondary | ICD-10-CM

## 2015-06-30 DIAGNOSIS — K589 Irritable bowel syndrome without diarrhea: Secondary | ICD-10-CM | POA: Diagnosis not present

## 2015-06-30 LAB — CBC WITH DIFFERENTIAL/PLATELET
BASOS PCT: 1 % (ref 0.0–3.0)
Basophils Absolute: 0.1 10*3/uL (ref 0.0–0.1)
EOS PCT: 0.6 % (ref 0.0–5.0)
Eosinophils Absolute: 0 10*3/uL (ref 0.0–0.7)
HCT: 44.1 % (ref 36.0–46.0)
HEMOGLOBIN: 15.2 g/dL — AB (ref 12.0–15.0)
LYMPHS ABS: 1.5 10*3/uL (ref 0.7–4.0)
Lymphocytes Relative: 28.5 % (ref 12.0–46.0)
MCHC: 34.5 g/dL (ref 30.0–36.0)
MCV: 89.4 fl (ref 78.0–100.0)
MONO ABS: 0.4 10*3/uL (ref 0.1–1.0)
MONOS PCT: 8.3 % (ref 3.0–12.0)
NEUTROS PCT: 61.6 % (ref 43.0–77.0)
Neutro Abs: 3.3 10*3/uL (ref 1.4–7.7)
Platelets: 415 10*3/uL — ABNORMAL HIGH (ref 150.0–400.0)
RBC: 4.93 Mil/uL (ref 3.87–5.11)
RDW: 12 % (ref 11.5–15.5)
WBC: 5.3 10*3/uL (ref 4.0–10.5)

## 2015-06-30 LAB — URINALYSIS
Bilirubin Urine: NEGATIVE
Hgb urine dipstick: NEGATIVE
KETONES UR: NEGATIVE
LEUKOCYTES UA: NEGATIVE
Nitrite: NEGATIVE
PH: 6 (ref 5.0–8.0)
Total Protein, Urine: NEGATIVE
URINE GLUCOSE: NEGATIVE
Urobilinogen, UA: 0.2 (ref 0.0–1.0)

## 2015-06-30 MED ORDER — SACCHAROMYCES BOULARDII 250 MG PO CAPS: 250.0000 mg | ORAL_CAPSULE | Freq: Two times a day (BID) | ORAL | Status: DC

## 2015-06-30 MED ORDER — DICYCLOMINE HCL 10 MG PO CAPS: 10.0000 mg | ORAL_CAPSULE | Freq: Three times a day (TID) | ORAL | Status: DC

## 2015-06-30 NOTE — Patient Instructions (Addendum)
You have been scheduled for a colonoscopy. Please follow written instructions given to you at your visit today.  Please pick up your prep supplies at the pharmacy within the next 1-3 days. If you use inhalers (even only as needed), please bring them with you on the day of your procedure. Your physician has requested that you go to www.startemmi.com and enter the access code given to you at your visit today. This web site gives a general overview about your procedure. However, you should still follow specific instructions given to you by our office regarding your preparation for the procedure.  Your physician has requested that you go to the basement for the following lab work before leaving today:UA  We have sent the following medications to your pharmacy for you to pick up at your convenience. Bentyl  Low residue diet for 2 weeks, then advance to high fiber diet/low fat diet.  Florastor 250mg  2 twice a day for 4 weeks

## 2015-06-30 NOTE — Progress Notes (Signed)
Patient ID: Jamie [name], female   DOB: 12-Nov-1950, 64 y.o.   MRN: [id number]     History of Present Illness: Jamie [name] is a 64 year old female who is known to Dr. Ardis Hughs for colonoscopy in 2009. She was noted to have moderate sigmoid diverticulosis and internal and external hemorrhoids. She was diagnosed by CT scan in May 2015 with an episode of diverticulitis which was treated. She had a repeat CT scan of the abdomen and pelvis done in July 2015 for pain. There was no evidence of diverticulitis but she did have diverticulosis and there was one segment of thickening in the sigmoid. Otherwise she had a negative study. She was diagnosed with a Klebsiella UTI in July 2015 and was treated by her PCP. She was recently admitted to the hospital November 2 through November 5 of 2016 with diverticulitis with microperforation and abscess. She was evaluated by surgery. Her white blood count normalized on Zosyn and she was discharged home on the fifth on a 10 day course of Augmentin. She was advised that she needs a colonoscopy in 6-8 weeks. She has a follow-up scheduled with Dr. Marcello Moores to discuss possible surgery. She has not had any fever. She is moving her bowels regularly she is complaining of some urinary frequency and mild burning on urination with suprapubic pressure. She denies a vaginal discharge. She does report some postprandial cramping relieved with passage of gas or defecation.   Past Medical History  Diagnosis Date  . Anxiety   . Hyperlipidemia   . Diverticulosis   . IBS (irritable bowel syndrome)   . Depression   . Diverticulitis   . Headache   . Lung mass   . Syncope 03/08/2015    Past Surgical History  Procedure Laterality Date  . Abdominal hysterectomy    . Tubal ligation    . Hemorrhoid surgery    . Tumor removal      Left thigh    Family History  Problem Relation Age of Onset  . Cancer Mother     renal cell carcinoma  . Diabetes Mother   . Heart disease Father     first  MI in 96s  . Cancer Sister     cervical cancer  . Heart disease Brother     first MI in 57s  . Alcohol abuse Sister   . Fibromyalgia Sister   . Arthritis Sister     osteoarthritis  . Irritable bowel syndrome Sister   . Leukemia      grandson   Social History  Substance Use Topics  . Smoking status: Former Smoker -- 0.80 packs/day for 20 years    Types: Cigarettes    Quit date: 08/13/1985  . Smokeless tobacco: Never Used  . Alcohol Use: Yes     Comment: rare   Current Outpatient Prescriptions  Medication Sig Dispense Refill  . ALPRAZolam (XANAX) 0.25 MG tablet TAKE 1 TABLET BY MOUTH AT BEDTIME AS NEEDED FOR ANXIETY OR SLEEP 30 tablet 0  . amoxicillin-clavulanate (AUGMENTIN) 875-125 MG tablet Take 1 tablet by mouth 2 (two) times daily. 10 day therapy course patient began on 06/13/15. 20 tablet 0  . esomeprazole (NEXIUM) 40 MG capsule Take 1 capsule (40 mg total) by mouth daily as needed (for acid reflux). 30 capsule 3  . saccharomyces boulardii (FLORASTOR) 250 MG capsule Take 1 capsule (250 mg total) by mouth 2 (two) times daily. 60 capsule 1  . ALOE VERA PO Take 1 Dose by mouth 2 (two)  times daily. 3oz twice daily    . dicyclomine (BENTYL) 10 MG capsule Take 1 capsule (10 mg total) by mouth 3 (three) times daily before meals. 90 capsule 3  . saccharomyces boulardii (FLORASTOR) 250 MG capsule Take 1 capsule (250 mg total) by mouth 2 (two) times daily. 120 capsule 0   No current facility-administered medications for this visit.   Allergies  Allergen Reactions  . Ciprofloxacin     Facial numbness  . Peanut Butter Flavor Nausea And Vomiting    N/V  . Shellfish Allergy Swelling     Review of Systems: Gen: Denies any fever, chills, sweats, anorexia, fatigue, weakness, malaise, weight loss, and sleep disorder CV: Denies chest pain, angina, palpitations, syncope, orthopnea, PND, peripheral edema, and claudication. Resp: Denies dyspnea at rest, dyspnea with exercise, cough,  sputum, wheezing, coughing up blood, and pleurisy. GI: Denies vomiting blood, jaundice, and fecal incontinence.   Denies dysphagia or odynophagia. GU : Denies urinary burning, blood in urine, urinary frequency, urinary hesitancy, nocturnal urination, and urinary incontinence. MS: Denies joint pain, limitation of movement, and swelling, stiffness, low back pain, extremity pain. Denies muscle weakness, cramps, atrophy.  Derm: Denies rash, itching, dry skin, hives, moles, warts, or unhealing ulcers.  Psych: Denies depression, anxiety, memory loss, suicidal ideation, hallucinations, paranoia, and confusion. Heme: Denies bruising, bleeding, and enlarged lymph nodes. Neuro:  Denies any headaches, dizziness, paresthesia Endo:  Denies any problems with DM, thyroid, adrenal  LAB RESULTS:  Recent Labs  06/30/15 1126  WBC 5.3  HGB 15.2*  HCT 44.1  PLT 415.0*     Studies:   Dg Chest 2 View  06/15/2015  CLINICAL DATA:  Cough and congestion for a few days EXAM: CHEST  2 VIEW COMPARISON:  02/16/2015 FINDINGS: Normal heart size, mediastinal contours, and pulmonary vascularity. Lungs clear. No pneumothorax. Bones demineralized. IMPRESSION: No acute abnormalities. Electronically Signed   By: Lavonia Dana M.D.   On: 06/15/2015 17:35   Ct Abdomen Pelvis W Contrast  06/15/2015  CLINICAL DATA:  Left-sided abdominal pain with nausea and cough. EXAM: CT ABDOMEN AND PELVIS WITH CONTRAST TECHNIQUE: Multidetector CT imaging of the abdomen and pelvis was performed using the standard protocol following bolus administration of intravenous contrast. CONTRAST:  42mL OMNIPAQUE IOHEXOL 300 MG/ML SOLN, 163mL OMNIPAQUE IOHEXOL 300 MG/ML SOLN COMPARISON:  PET 01/14/2016 and CT abdomen pelvis 02/20/2014. FINDINGS: Lower chest: Lung bases show no acute findings. Heart size normal. No pericardial or pleural effusion. Hepatobiliary: 5 mm low-attenuation lesion in the right hepatic lobe is unchanged and may represent a cyst or  hamartoma. Liver and gallbladder are otherwise unremarkable. No biliary ductal dilatation. Pancreas: Negative. Spleen: Negative. Adrenals/Urinary Tract: Right ureter is decompressed. Left ureter is mildly dilated secondary to inflammatory changes in the left anatomic pelvis. Bladder is grossly unremarkable. Stomach/Bowel: Stomach, small bowel, appendix and majority of the colon are unremarkable. There is soft tissue thickening involving the proximal sigmoid colon with pericolonic stranding. A 2.7 cm fluid collection is seen along the posterior margin of the proximal sigmoid colon (series 2, image 64). No extraluminal air. Vascular/Lymphatic: Atherosclerotic calcification of the arterial vasculature is mild. No pathologically enlarged lymph nodes. Reproductive: Hysterectomy. Right ovary is likely visualized. Left ovary is not well visualized. Other: No free fluid.  Mesenteries and peritoneum are unremarkable. Musculoskeletal: No worrisome lytic or sclerotic lesions. IMPRESSION: 1. Sigmoid diverticulitis complicated by microperforation and small abscess. 2. Mild left ureteral dilatation is seen secondarily. Electronically Signed   By: Lorin Picket M.D.  On: 06/15/2015 17:47     Physical Exam: BP 100/70 mmHg  Pulse 80  Ht 5\' 5"  (1.651 m)  Wt 150 lb 6.4 oz (68.221 kg)  BMI 25.03 kg/m2 General: Pleasant, well developed , occasion female in no acute distress Head: Normocephalic and atraumatic Eyes:  sclerae anicteric, conjunctiva pink  Ears: Normal auditory acuity Lungs: Clear throughout to auscultation Heart: Regular rate and rhythm Abdomen: Soft, non distended, non-tender. No masses, no hepatomegaly. Normal bowel sounds Musculoskeletal: Symmetrical with no gross deformities  Extremities: No edema  Neurological: Alert oriented x 4, grossly nonfocal Psychological:  Alert and cooperative. Normal mood and affect  Assessment and Recommendations: 64 year old female status post recent admission for  recurrent diverticulitis with microperforation and abscess, here for follow-up. On exam she is nontender today. She is complaining of some dysuria, and a urinalysis will be obtained along with a CBC. She has been instructed to adhere to a low residue diet for the next 2 weeks and then advance to a high-fiber diet over the left several weeks. She will limit fat intake as well. She will continue Florastor 250 mg 2 capsules twice daily for 4 more weeks. She will be scheduled for a colonoscopy to evaluate for polyps or neoplasia.The risks, benefits, and alternatives to colonoscopy with possible biopsy and possible polypectomy were discussed with the patient and they consent to proceed.  The procedure will be scheduled with Dr. Ardis Hughs in January. Further recommendations will be made pending the findings of the above. The patient has been instructed to contact us if she has recurrence of pain or fever over the next few weeks.        Irean Kendricks, Vita Barley PA-C 06/30/2015,

## 2015-07-01 NOTE — Progress Notes (Signed)
i agree with the above note, plan 

## 2015-07-04 ENCOUNTER — Other Ambulatory Visit (INDEPENDENT_AMBULATORY_CARE_PROVIDER_SITE_OTHER): Payer: Medicaid Other

## 2015-07-04 DIAGNOSIS — R7989 Other specified abnormal findings of blood chemistry: Secondary | ICD-10-CM

## 2015-07-04 DIAGNOSIS — D473 Essential (hemorrhagic) thrombocythemia: Secondary | ICD-10-CM

## 2015-07-05 ENCOUNTER — Telehealth: Payer: Self-pay | Admitting: Physician Assistant

## 2015-07-05 LAB — CBC WITH DIFFERENTIAL/PLATELET
BASOS PCT: 1.5 % (ref 0.0–3.0)
Basophils Absolute: 0.1 10*3/uL (ref 0.0–0.1)
EOS ABS: 0 10*3/uL (ref 0.0–0.7)
Eosinophils Relative: 0.8 % (ref 0.0–5.0)
HEMATOCRIT: 42.2 % (ref 36.0–46.0)
Hemoglobin: 14.4 g/dL (ref 12.0–15.0)
LYMPHS PCT: 25.5 % (ref 12.0–46.0)
Lymphs Abs: 1.2 10*3/uL (ref 0.7–4.0)
MCHC: 34.1 g/dL (ref 30.0–36.0)
MCV: 89.6 fl (ref 78.0–100.0)
MONOS PCT: 5.3 % (ref 3.0–12.0)
Monocytes Absolute: 0.3 10*3/uL (ref 0.1–1.0)
NEUTROS ABS: 3.3 10*3/uL (ref 1.4–7.7)
Neutrophils Relative %: 66.9 % (ref 43.0–77.0)
PLATELETS: 348 10*3/uL (ref 150.0–400.0)
RBC: 4.7 Mil/uL (ref 3.87–5.11)
RDW: 12.4 % (ref 11.5–15.5)
WBC: 4.9 10*3/uL (ref 4.0–10.5)

## 2015-07-05 NOTE — Telephone Encounter (Signed)
See results note. 

## 2015-07-12 ENCOUNTER — Ambulatory Visit (INDEPENDENT_AMBULATORY_CARE_PROVIDER_SITE_OTHER): Payer: Medicaid Other | Admitting: Physician Assistant

## 2015-07-12 VITALS — BP 107/67 | HR 88 | Wt 152.0 lb

## 2015-07-12 DIAGNOSIS — D510 Vitamin B12 deficiency anemia due to intrinsic factor deficiency: Secondary | ICD-10-CM | POA: Diagnosis not present

## 2015-07-12 MED ORDER — CYANOCOBALAMIN 1000 MCG/ML IJ SOLN
1000.0000 ug | Freq: Once | INTRAMUSCULAR | Status: AC
  Administered 2015-07-12: 1000 ug via INTRAMUSCULAR

## 2015-07-12 NOTE — Progress Notes (Signed)
   Subjective:    Patient ID: Jamie [name], female    DOB: [birth year], 64 y.o.   MRN: [id number]  HPI  Jamie [name] is here for a vitamin B 12 injection. Denies any medication problems.   Review of Systems     Objective:   Physical Exam        Assessment & Plan:  Pernicious Anemia - Patient tolerated injection well without complications. Patient advised to schedule next injection 30 days from today.

## 2015-07-13 ENCOUNTER — Other Ambulatory Visit: Payer: Self-pay | Admitting: Physician Assistant

## 2015-07-13 DIAGNOSIS — R109 Unspecified abdominal pain: Secondary | ICD-10-CM

## 2015-07-13 DIAGNOSIS — M549 Dorsalgia, unspecified: Secondary | ICD-10-CM

## 2015-07-13 DIAGNOSIS — D582 Other hemoglobinopathies: Secondary | ICD-10-CM

## 2015-07-13 DIAGNOSIS — L659 Nonscarring hair loss, unspecified: Secondary | ICD-10-CM

## 2015-07-13 DIAGNOSIS — D510 Vitamin B12 deficiency anemia due to intrinsic factor deficiency: Secondary | ICD-10-CM

## 2015-07-13 DIAGNOSIS — Z131 Encounter for screening for diabetes mellitus: Secondary | ICD-10-CM

## 2015-07-13 DIAGNOSIS — R5382 Chronic fatigue, unspecified: Secondary | ICD-10-CM

## 2015-07-14 ENCOUNTER — Telehealth: Payer: Self-pay | Admitting: *Deleted

## 2015-07-14 NOTE — Telephone Encounter (Signed)
Paul from [facility] requested that the orders [name] placed yesterday be put on one order instead of 14.  I have no way to go & look at all the dx codes that [facility] used.  Will you be able to place these for me?

## 2015-07-14 NOTE — Telephone Encounter (Signed)
I'm not sure what Jamie [name]'s plan for this was, either, there is no note I can see which describes her plan for these labs, and if I reorder all these the results will come to me when I've never seen this person. The patient didn't have an appointment on the day these were ordered so not sure why these were ordered? I hate to make patient wait but Jamie [name] should probably address this when she gets back tomorrow.

## 2015-07-15 ENCOUNTER — Ambulatory Visit (INDEPENDENT_AMBULATORY_CARE_PROVIDER_SITE_OTHER): Payer: Medicaid Other | Admitting: Physician Assistant

## 2015-07-15 DIAGNOSIS — R5383 Other fatigue: Secondary | ICD-10-CM

## 2015-07-15 DIAGNOSIS — E538 Deficiency of other specified B group vitamins: Secondary | ICD-10-CM

## 2015-07-15 DIAGNOSIS — L659 Nonscarring hair loss, unspecified: Secondary | ICD-10-CM

## 2015-07-15 DIAGNOSIS — M549 Dorsalgia, unspecified: Secondary | ICD-10-CM

## 2015-07-15 LAB — COMPLETE METABOLIC PANEL WITH GFR
ALBUMIN: 4.6 g/dL (ref 3.6–5.1)
ALK PHOS: 52 U/L (ref 33–130)
ALT: 17 U/L (ref 6–29)
AST: 19 U/L (ref 10–35)
BUN: 16 mg/dL (ref 7–25)
CALCIUM: 9.5 mg/dL (ref 8.6–10.4)
CO2: 28 mmol/L (ref 20–31)
CREATININE: 0.75 mg/dL (ref 0.50–0.99)
Chloride: 102 mmol/L (ref 98–110)
GFR, Est African American: >89 mL/min (ref >=60)
GFR, Est Non African American: 86 mL/min (ref >=60)
Glucose, Bld: 93 mg/dL (ref 65–99)
Potassium: 4.1 mmol/L (ref 3.5–5.3)
Sodium: 138 mmol/L (ref 135–146)
Total Bilirubin: 0.7 mg/dL (ref 0.2–1.2)
Total Protein: 7.2 g/dL (ref 6.1–8.1)

## 2015-07-15 LAB — CBC WITH DIFFERENTIAL/PLATELET
Basophils Absolute: 0 10*3/uL (ref 0.0–0.1)
Basophils Relative: 1 % (ref 0–1)
EOS PCT: 2 % (ref 0–5)
Eosinophils Absolute: 0.1 10*3/uL (ref 0.0–0.7)
HEMATOCRIT: 40.6 % (ref 36.0–46.0)
Hemoglobin: 13.8 g/dL (ref 12.0–15.0)
LYMPHS PCT: 34 % (ref 12–46)
Lymphs Abs: 1.5 10*3/uL (ref 0.7–4.0)
MCH: 30.1 pg (ref 26.0–34.0)
MCHC: 34 g/dL (ref 30.0–36.0)
MCV: 88.5 fL (ref 78.0–100.0)
MONO ABS: 0.5 10*3/uL (ref 0.1–1.0)
MPV: 10.3 fL (ref 8.6–12.4)
Monocytes Relative: 12 % (ref 3–12)
Neutro Abs: 2.3 10*3/uL (ref 1.7–7.7)
Neutrophils Relative %: 51 % (ref 43–77)
Platelets: 255 10*3/uL (ref 150–400)
RBC: 4.59 MIL/uL (ref 3.87–5.11)
RDW: 12.9 % (ref 11.5–15.5)
WBC: 4.5 10*3/uL (ref 4.0–10.5)

## 2015-07-15 LAB — C-REACTIVE PROTEIN: CRP: <0.5 mg/dL (ref <0.60)

## 2015-07-15 LAB — RETICULOCYTES
ABS Retic: 64.3 10*3/uL (ref 19.0–186.0)
RBC.: 4.59 MIL/uL (ref 3.87–5.11)
RETIC CT PCT: 1.4 % (ref 0.4–2.3)

## 2015-07-15 LAB — HEMOGLOBIN A1C
Hgb A1c MFr Bld: 5.6 % (ref <5.7)
Mean Plasma Glucose: 114 mg/dL (ref <117)

## 2015-07-15 LAB — TSH: TSH: 5.995 u[IU]/mL — ABNORMAL HIGH (ref 0.350–4.500)

## 2015-07-15 LAB — IRON AND TIBC
%SAT: 35 % (ref 11–50)
IRON: 125 ug/dL (ref 45–160)
TIBC: 357 ug/dL (ref 250–450)
UIBC: 232 ug/dL (ref 125–400)

## 2015-07-15 LAB — FERRITIN: FERRITIN: 21 ng/mL (ref 10–291)

## 2015-07-15 LAB — VITAMIN B12: Vitamin B-12: 717 pg/mL (ref 211–911)

## 2015-07-15 LAB — FOLATE: Folate: 16.3 ng/mL

## 2015-07-15 NOTE — Progress Notes (Signed)
   Subjective:    Patient ID: Jamie [name], female    DOB: 01-14-1951, 64 y.o.   MRN: [id number]  HPI    Review of Systems     Objective:   Physical Exam        Assessment & Plan:    Reordered labs so that she could come next week for CPE and discuss labs. She brings in list of things she is concerned with.

## 2015-07-16 LAB — SEDIMENTATION RATE: SED RATE: 5 mm/h (ref 0–30)

## 2015-07-18 LAB — PTH, INTACT AND CALCIUM
CALCIUM: 9.5 mg/dL (ref 8.4–10.5)
PTH: 44 pg/mL (ref 14–64)

## 2015-07-18 LAB — METHYLMALONIC ACID, SERUM: Methylmalonic Acid, Quant: 164 nmol/L (ref 87–318)

## 2015-07-18 LAB — ANA: ANA: NEGATIVE

## 2015-07-19 ENCOUNTER — Encounter: Payer: Self-pay | Admitting: Physician Assistant

## 2015-07-19 ENCOUNTER — Ambulatory Visit (INDEPENDENT_AMBULATORY_CARE_PROVIDER_SITE_OTHER): Payer: Medicaid Other | Admitting: Physician Assistant

## 2015-07-19 VITALS — BP 133/54 | HR 87 | Ht 65.0 in | Wt 152.0 lb

## 2015-07-19 DIAGNOSIS — K5900 Constipation, unspecified: Secondary | ICD-10-CM

## 2015-07-19 DIAGNOSIS — E039 Hypothyroidism, unspecified: Secondary | ICD-10-CM | POA: Diagnosis not present

## 2015-07-19 DIAGNOSIS — Z8619 Personal history of other infectious and parasitic diseases: Secondary | ICD-10-CM

## 2015-07-19 DIAGNOSIS — L819 Disorder of pigmentation, unspecified: Secondary | ICD-10-CM

## 2015-07-19 DIAGNOSIS — E038 Other specified hypothyroidism: Secondary | ICD-10-CM

## 2015-07-19 DIAGNOSIS — Z0000 Encounter for general adult medical examination without abnormal findings: Secondary | ICD-10-CM

## 2015-07-19 DIAGNOSIS — Z8051 Family history of malignant neoplasm of kidney: Secondary | ICD-10-CM

## 2015-07-19 DIAGNOSIS — M549 Dorsalgia, unspecified: Secondary | ICD-10-CM

## 2015-07-19 DIAGNOSIS — R799 Abnormal finding of blood chemistry, unspecified: Secondary | ICD-10-CM

## 2015-07-19 DIAGNOSIS — E785 Hyperlipidemia, unspecified: Secondary | ICD-10-CM

## 2015-07-19 DIAGNOSIS — L659 Nonscarring hair loss, unspecified: Secondary | ICD-10-CM

## 2015-07-19 DIAGNOSIS — R790 Abnormal level of blood mineral: Secondary | ICD-10-CM

## 2015-07-19 HISTORY — DX: Constipation, unspecified: K59.00

## 2015-07-19 HISTORY — DX: Disorder of pigmentation, unspecified: L81.9

## 2015-07-19 HISTORY — DX: Other specified hypothyroidism: E03.8

## 2015-07-19 HISTORY — DX: Abnormal level of blood mineral: R79.0

## 2015-07-19 HISTORY — DX: Personal history of other infectious and parasitic diseases: Z86.19

## 2015-07-19 MED ORDER — ALPRAZOLAM 0.5 MG PO TABS: 0.5000 mg | ORAL_TABLET | Freq: Every evening | ORAL | Status: DC | PRN

## 2015-07-19 MED ORDER — LINACLOTIDE 145 MCG PO CAPS: 145.0000 ug | ORAL_CAPSULE | Freq: Every day | ORAL | Status: DC

## 2015-07-19 MED ORDER — LEVOTHYROXINE SODIUM 50 MCG PO TABS: 50.0000 ug | ORAL_TABLET | Freq: Every day | ORAL | Status: DC

## 2015-07-19 NOTE — Telephone Encounter (Signed)
Done

## 2015-07-19 NOTE — Patient Instructions (Addendum)
Ferrous sulfate 367m once a day take with food and can cause constipation.  selsum blue shampoo use as body wash daily for 2 weeks.   Iron-Rich Diet Iron is a mineral that helps your body to produce hemoglobin. Hemoglobin is a protein in your red blood cells that carries oxygen to your body's tissues. Eating too little iron may cause you to feel weak and tired, and it can increase your risk for infection. Eating enough iron is necessary for your body's metabolism, muscle function, and nervous system. Iron is naturally found in many foods. It can also be added to foods or fortified in foods. There are two types of dietary iron:  Heme iron. Heme iron is absorbed by the body more easily than nonheme iron. Heme iron is found in meat, poultry, and fish.  Nonheme iron. Nonheme iron is found in dietary supplements, iron-fortified grains, beans, and vegetables. You may need to follow an iron-rich diet if:  You have been diagnosed with iron deficiency or iron-deficiency anemia.  You have a condition that prevents you from absorbing dietary iron, such as:  Infection in your intestines.  Celiac disease. This involves long-lasting (chronic) inflammation of your intestines.  You do not eat enough iron.  You eat a diet that is high in foods that impair iron absorption.  You have lost a lot of blood.  You have heavy bleeding during your menstrual cycle.  You are pregnant. WHAT IS MY PLAN? Your health care provider may help you to determine how much iron you need per day based on your condition. Generally, when a person consumes sufficient amounts of iron in the diet, the following iron needs are met:  Men.  21017years old: 11 mg per day.  [age]37years old: 8 mg per day.  Women.   1581110years old: 15 mg per day.  130554years old: 18 mg per day.  Over 535years old: 8 mg per day.  Pregnant women: 27 mg per day.  Breastfeeding women: 9 mg per day. WHAT DO I NEED TO KNOW ABOUT AN  IRON-RICH DIET?  Eat fresh fruits and vegetables that are high in vitamin C along with foods that are high in iron. This will help increase the amount of iron that your body absorbs from food, especially with foods containing nonheme iron. Foods that are high in vitamin C include oranges, peppers, tomatoes, and mango.  Take iron supplements only as directed by your health care provider. Overdose of iron can be life-threatening. If you were prescribed iron supplements, take them with orange juice or a vitamin C supplement.  Cook foods in pots and pans that are made from iron.   Eat nonheme iron-containing foods alongside foods that are high in heme iron. This helps to improve your iron absorption.   Certain foods and drinks contain compounds that impair iron absorption. Avoid eating these foods in the same meal as iron-rich foods or with iron supplements. These include:  Coffee, black tea, and red wine.  Milk, dairy products, and foods that are high in calcium.  Beans, soybeans, and peas.  Whole grains.  When eating foods that contain both nonheme iron and compounds that impair iron absorption, follow these tips to absorb iron better.   Soak beans overnight before cooking.  Soak whole grains overnight and drain them before using.  Ferment flours before baking, such as using yeast in bread dough. WHAT FOODS CAN I EAT? Grains Iron-fortified breakfast cereal. Iron-fortified whole-wheat bread. Enriched rice. Sprouted  grains. Vegetables Spinach. Potatoes with skin. Green peas. Broccoli. Red and green bell peppers. Fermented vegetables. Fruits Prunes. Raisins. Oranges. Strawberries. Mango. Grapefruit. Meats and Other Protein Sources Beef liver. Oysters. Beef. Shrimp. [place]. Chicken. [place]. Sardines. Chickpeas. Nuts. Tofu. Beverages Tomato juice. Fresh orange juice. Prune juice. Hibiscus tea. Fortified instant breakfast shakes. Condiments Tahini. Fermented soy sauce. Sweets  and Desserts Black-strap molasses.  Other Wheat germ. The items listed above may not be a complete list of recommended foods or beverages. Contact your dietitian for more options. WHAT FOODS ARE NOT RECOMMENDED? Grains Whole grains. Bran cereal. Bran flour. Oats. Vegetables Artichokes. Brussels sprouts. Kale. Fruits Blueberries. Raspberries. Strawberries. Figs. Meats and Other Protein Sources Soybeans. Products made from soy protein. Dairy Milk. Cream. Cheese. Yogurt. Cottage cheese. Beverages Coffee. Black tea. Red wine. Sweets and Desserts Cocoa. Chocolate. Ice cream. Other Basil. Oregano. Parsley. The items listed above may not be a complete list of foods and beverages to avoid. Contact your dietitian for more information.   This information is not intended to replace advice given to you by your health care provider. Make sure you discuss any questions you have with your health care provider.   Document Released: 03/13/2005 Document Revised: 08/20/2014 Document Reviewed: 02/24/2014 Elsevier Interactive Patient Education 2016 [employer]. Levothyroxine tablets What is this medicine? LEVOTHYROXINE (lee voe thye ROX een) is a thyroid hormone. This medicine can improve symptoms of thyroid deficiency such as slow speech, lack of energy, weight gain, hair loss, dry skin, and feeling cold. It also helps to treat goiter (an enlarged thyroid gland). It is also used to treat some kinds of thyroid cancer along with surgery and other medicines. This medicine may be used for other purposes; ask your health care provider or pharmacist if you have questions. What should I tell my health care provider before I take this medicine? They need to know if you have any of these conditions: -angina -blood clotting problems -diabetes -dieting or on a weight loss program -fertility problems -heart disease -high levels of thyroid hormone -pituitary gland problem -previous heart  attack -an unusual or allergic reaction to levothyroxine, thyroid hormones, other medicines, foods, dyes, or preservatives -pregnant or trying to get pregnant -breast-feeding How should I use this medicine? Take this medicine by mouth with plenty of water. It is best to take on an empty stomach, at least 30 minutes before or 2 hours after food. Follow the directions on the prescription label. Take at the same time each day. Do not take your medicine more often than directed. Contact your pediatrician regarding the use of this medicine in children. While this drug may be prescribed for children and infants as young as a few days of age for selected conditions, precautions do apply. For infants, you may crush the tablet and place in a small amount of (5-10 ml or 1 to 2 teaspoonfuls) of water, breast milk, or non-soy based infant formula. Do not mix with soy-based infant formula. Give as directed. Overdosage: If you think you have taken too much of this medicine contact a poison control center or emergency room at once. NOTE: This medicine is only for you. Do not share this medicine with others. What if I miss a dose? If you miss a dose, take it as soon as you can. If it is almost time for your next dose, take only that dose. Do not take double or extra doses. What may interact with this medicine? -amiodarone -antacids -anti-thyroid medicines -calcium supplements -carbamazepine -cholestyramine -colestipol -  digoxin -female hormones, including contraceptive or birth control pills -iron supplements -ketamine -liquid nutrition products like Ensure -medicines for colds and breathing difficulties -medicines for diabetes -medicines for mental depression -medicines or herbals used to decrease weight or appetite -phenobarbital or other barbiturate medications -phenytoin -prednisone or other corticosteroids -rifabutin -rifampin -soy isoflavones -sucralfate -theophylline -warfarin This list  may not describe all possible interactions. Give your health care provider a list of all the medicines, herbs, non-prescription drugs, or dietary supplements you use. Also tell them if you smoke, drink alcohol, or use illegal drugs. Some items may interact with your medicine. What should I watch for while using this medicine? Be sure to take this medicine with plenty of fluids. Some tablets may cause choking, gagging, or difficulty swallowing from the tablet getting stuck in your throat. Most of these problems disappear if the medicine is taken with the right amount of water or other fluids. Do not switch brands of this medicine unless your health care professional agrees with the change. Ask questions if you are uncertain. You will need regular exams and occasional blood tests to check the response to treatment. If you are receiving this medicine for an underactive thyroid, it may be several weeks before you notice an improvement. Check with your doctor or health care professional if your symptoms do not improve. It may be necessary for you to take this medicine for the rest of your life. Do not stop using this medicine unless your doctor or health care professional advises you to. This medicine can affect blood sugar levels. If you have diabetes, check your blood sugar as directed. You may lose some of your hair when you first start treatment. With time, this usually corrects itself. If you are going to have surgery, tell your doctor or health care professional that you are taking this medicine. What side effects may I notice from receiving this medicine? Side effects that you should report to your doctor or health care professional as soon as possible: -allergic reactions like skin rash, itching or hives, swelling of the face, lips, or tongue -chest pain -excessive sweating or intolerance to heat -fast or irregular heartbeat -nervousness -skin rash or hives -swelling of ankles, feet, or  legs -tremors Side effects that usually do not require medical attention (report to your doctor or health care professional if they continue or are bothersome): -changes in appetite -changes in menstrual periods -diarrhea -hair loss -headache -trouble sleeping -weight loss This list may not describe all possible side effects. Call your doctor for medical advice about side effects. You may report side effects to FDA at 1-800-FDA-1088. Where should I keep my medicine? Keep out of the reach of children. Store at room temperature between 15 and 30 degrees C (59 and 86 degrees F). Protect from light and moisture. Keep container tightly closed. Throw away any unused medicine after the expiration date. NOTE: This sheet is a summary. It may not cover all possible information. If you have questions about this medicine, talk to your doctor, pharmacist, or health care provider.    2016, Elsevier/Gold Standard. (2008-11-05 14:28:07) Tinea Versicolor Tinea versicolor is a common fungal infection of the skin. It causes a rash that appears as light or dark patches on the skin. The rash most often occurs on the chest, back, neck, or upper arms. This condition is more common during warm weather. Other than affecting how your skin looks, tinea versicolor usually does not cause other problems. In most cases, the infection goes away  in a few weeks with treatment. It may take a few months for the patches on your skin to clear up. CAUSES Tinea versicolor occurs when a type of fungus that is normally present on the skin starts to overgrow. This fungus is a kind of yeast. The exact cause of the overgrowth is not known. This condition cannot be passed from one person to another (noncontagious). RISK FACTORS This condition is more likely to develop when certain factors are present, such as:  Heat and humidity.  Sweating too much.  Hormone changes.  Oily skin.  A weak defense (immune)  system. SYMPTOMS Symptoms of this condition may include:  A rash on your skin that is made up of light or dark patches. The rash may have:  Patches of tan or pink spots on light skin.  Patches of white or brown spots on dark skin.  Patches of skin that do not tan.  Well-marked edges.  Scales on the discolored areas.  Mild itching. DIAGNOSIS A health care provider can usually diagnose this condition by looking at your skin. During the exam, he or she may use ultraviolet light to help determine the extent of the infection. In some cases, a skin sample may be taken by scraping the rash. This sample will be viewed under a microscope to check for yeast overgrowth. TREATMENT Treatment for this condition may include:  Dandruff shampoo that is applied to the affected skin during showers or bathing.  Over-the-counter medicated skin cream, lotion, or soaps.  Prescription antifungal medicine in the form of skin cream or pills.  Medicine to help reduce itching. HOME CARE INSTRUCTIONS  Take medicines only as directed by your health care provider.  Apply dandruff shampoo to the affected area if told to do so by your health care provider. You may be instructed to scrub the affected skin for several minutes each day.  Do not scratch the affected area of skin.  Avoid hot and humid conditions.  Do not use tanning booths.  Try to avoid sweating a lot. SEEK MEDICAL CARE IF:  Your symptoms get worse.  You have a fever.  You have redness, swelling, or pain at the site of your rash.  You have fluid, blood, or pus coming from your rash.  Your rash returns after treatment.   This information is not intended to replace advice given to you by your health care provider. Make sure you discuss any questions you have with your health care provider.   Document Released: 07/27/2000 Document Revised: 08/20/2014 Document Reviewed: 05/11/2014 Elsevier Interactive Patient Education [employer].

## 2015-07-20 ENCOUNTER — Telehealth: Payer: Self-pay | Admitting: *Deleted

## 2015-07-20 DIAGNOSIS — L659 Nonscarring hair loss, unspecified: Secondary | ICD-10-CM

## 2015-07-20 DIAGNOSIS — M549 Dorsalgia, unspecified: Secondary | ICD-10-CM

## 2015-07-20 DIAGNOSIS — Z8051 Family history of malignant neoplasm of kidney: Secondary | ICD-10-CM | POA: Insufficient documentation

## 2015-07-20 DIAGNOSIS — E785 Hyperlipidemia, unspecified: Secondary | ICD-10-CM

## 2015-07-20 HISTORY — DX: Nonscarring hair loss, unspecified: L65.9

## 2015-07-20 HISTORY — DX: Dorsalgia, unspecified: M54.9

## 2015-07-20 HISTORY — DX: Family history of malignant neoplasm of kidney: Z80.51

## 2015-07-20 NOTE — Progress Notes (Signed)
Subjective:     Jamie [name] is a 64 y.o. female and is here for a comprehensive physical exam. The patient reports problems - pt complains of multiple problems on todays exam. she requested blood work and wants to discuss it. she is having some hair loss, dry itchy skin with hyperpigmentation, left mid back pain, right sided neck pain, fatigue and constipation. she is concerned for renal cancer since her mom had. she has elevated LDL and concerned about carotid stenosis. she took linzess that I gave her before and helping with constipation and she wants a refill. .  Social History   Social History  . Marital Status: Divorced    Spouse Name: N/A  . Number of Children: 3  . Years of Education: N/A   Occupational History  . Student    Social History Main Topics  . Smoking status: Former Smoker -- 0.80 packs/day for 20 years    Types: Cigarettes    Quit date: 08/13/1985  . Smokeless tobacco: Never Used  . Alcohol Use: Yes     Comment: rare  . Drug Use: No  . Sexual Activity: Not on file   Other Topics Concern  . Not on file   Social History Narrative   Lives with and grandchild (she is foster parent for grand child)Completed school full time.   Caffeine use; occasional   Works in Personal assistant, Economist.   In process of divorce.           Health Maintenance  Topic Date Due  . INFLUENZA VACCINE  07/18/2016 (Originally 03/14/2015)  . Hepatitis C Screening  07/18/2016 (Originally [birth year])  . ZOSTAVAX  07/19/2035 (Originally 12/19/2012)  . PAP SMEAR  07/18/2045 (Originally 11/11/2012)  . TETANUS/TDAP  08/14/2015  . MAMMOGRAM  01/23/2017  . COLONOSCOPY  07/19/2018  . HIV Screening  Completed    The following portions of the patient's history were reviewed and updated as appropriate: allergies, current medications, past family history, past medical history, past social history, past surgical history and problem list.  Review of Systems Pertinent items noted  in HPI and remainder of comprehensive ROS otherwise negative.   Objective:    BP 133/54 mmHg  Pulse 87  Ht 5\' 5"  (1.651 m)  Wt 152 lb (68.947 kg)  BMI 25.29 kg/m2 General appearance: alert, cooperative and appears stated age Head: Normocephalic, without obvious abnormality, atraumatic Eyes: conjunctivae/corneas clear. PERRL, EOM's intact. Fundi benign. Ears: normal TM's and external ear canals both ears Nose: Nares normal. Septum midline. Mucosa normal. No drainage or sinus tenderness. Throat: lips, mucosa, and tongue normal; teeth and gums normal Neck: no adenopathy, no carotid bruit, no JVD, supple, symmetrical, trachea midline and thyroid not enlarged, symmetric, no tenderness/mass/nodules Back: symmetric, no curvature. ROM normal. No CVA tenderness. Lungs: clear to auscultation bilaterally Heart: regular rate and rhythm, S1, S2 normal, no murmur, click, rub or gallop Abdomen: soft, non-tender; bowel sounds normal; no masses,  no organomegaly Extremities: extremities normal, atraumatic, no cyanosis or edema Pulses: 2+ and symmetric Skin: Skin color, texture, turgor normal. No rashes or lesions or hyperpigmentation of skin over entire body in spots. around neck, arms and back.  no scales or erythema noted.  Lymph nodes: Cervical, supraclavicular, and axillary nodes normal. Neurologic: Grossly normal    Assessment:    Healthy female exam.      Plan:    CPE- discussed labs see below. Declined flu and zostavax. Encouraged vitamin D 800 units and calcium 1500mg  daily.  Encouraged exercise.   Hypothyroidism- TSH low on labs. Started 72mcg of levothyroxine. Recheck in 6-8 weeks.   Constipation- sent lizness to take daily with meals. Discussed if too much could take every other day. Follow up as needed.   Iron stores low- start once daily ferrous sulfate 3101mcg. Watch for worsening constipation. If occurs consider iron rich foods and not supplement.   Hyperpigmentation of skin-  tinea versicolor vs vitiligo. Try selsum blue shampoo as body wash for 2 weeks. If no improvement can send to dermatology.   Hyperlipidemia/right neck pain- reassured pt I did not think pain was coming from carotid but she wanted ultrasound and order to scan carotid. She does not want to start statins at this time.   Mid back pain left/family hx of renal cancer- will order u/s of renal due to fam hx.  See After Visit Summary for Counseling Recommendations

## 2015-07-20 NOTE — Telephone Encounter (Signed)
Vascular study ordered.

## 2015-07-22 ENCOUNTER — Other Ambulatory Visit: Payer: Self-pay | Admitting: *Deleted

## 2015-07-22 ENCOUNTER — Ambulatory Visit (HOSPITAL_COMMUNITY)
Admission: RE | Admit: 2015-07-22 | Discharge: 2015-07-22 | Disposition: A | Discharge status: Home / Self Care | Payer: Medicaid Other | Source: Ambulatory Visit | Attending: Physician Assistant | Admitting: Physician Assistant

## 2015-07-22 DIAGNOSIS — Z8673 Personal history of transient ischemic attack (TIA), and cerebral infarction without residual deficits: Secondary | ICD-10-CM | POA: Diagnosis not present

## 2015-07-22 DIAGNOSIS — E785 Hyperlipidemia, unspecified: Secondary | ICD-10-CM | POA: Insufficient documentation

## 2015-07-22 MED ORDER — LEVOTHYROXINE SODIUM 50 MCG PO TABS: 50.0000 ug | ORAL_TABLET | Freq: Every day | ORAL | Status: DC

## 2015-07-22 NOTE — Progress Notes (Signed)
*  PRELIMINARY RESULTS* Vascular Ultrasound Carotid Duplex (Doppler) has been completed.  Findings suggest 1-39% internal carotid artery stenosis bilaterally. Vertebral arteries are patent with antegrade flow.  07/22/2015 11:14 AM Maudry Mayhew, RVT, RDCS, RDMS

## 2015-08-01 ENCOUNTER — Telehealth: Payer: Self-pay | Admitting: *Deleted

## 2015-08-01 ENCOUNTER — Other Ambulatory Visit: Payer: Self-pay | Admitting: *Deleted

## 2015-08-01 DIAGNOSIS — E039 Hypothyroidism, unspecified: Secondary | ICD-10-CM

## 2015-08-01 DIAGNOSIS — E785 Hyperlipidemia, unspecified: Secondary | ICD-10-CM

## 2015-08-01 DIAGNOSIS — E538 Deficiency of other specified B group vitamins: Secondary | ICD-10-CM

## 2015-08-01 MED ORDER — PRAVASTATIN SODIUM 40 MG PO TABS: 40.0000 mg | ORAL_TABLET | Freq: Every day | ORAL | Status: DC

## 2015-08-01 NOTE — Telephone Encounter (Signed)
Last b12 injection was 07/12/15.  Did you want her to come back every 30 days?

## 2015-08-01 NOTE — Telephone Encounter (Signed)
Let's do every other month and then recheck before next injection? So next injection 1/29

## 2015-08-01 NOTE — Telephone Encounter (Signed)
Her last b12 injection was 07/12/15 and is scheduled every 30 days.  Did you still want to keep on this schedule?

## 2015-08-02 ENCOUNTER — Ambulatory Visit: Payer: Medicaid Other

## 2015-08-17 ENCOUNTER — Other Ambulatory Visit: Payer: Self-pay

## 2015-08-17 ENCOUNTER — Telehealth: Payer: Self-pay | Admitting: Physician Assistant

## 2015-08-17 DIAGNOSIS — E039 Hypothyroidism, unspecified: Secondary | ICD-10-CM

## 2015-08-17 DIAGNOSIS — E785 Hyperlipidemia, unspecified: Secondary | ICD-10-CM

## 2015-08-17 DIAGNOSIS — E538 Deficiency of other specified B group vitamins: Secondary | ICD-10-CM

## 2015-08-17 NOTE — Telephone Encounter (Signed)
Pt would like an order placed for a bone density scan, she prefers our imaging location.

## 2015-08-17 NOTE — Telephone Encounter (Signed)
It has not been 2 years yet. Last one 01/2014. Please wait until the summer.

## 2015-08-19 NOTE — Telephone Encounter (Signed)
Pt advised she will have to wait until the 2 year mark before new bone density can be completed. Verbalized understanding. No further questions.

## 2015-08-23 ENCOUNTER — Telehealth: Payer: Self-pay | Admitting: *Deleted

## 2015-08-23 ENCOUNTER — Encounter: Payer: Self-pay | Admitting: *Deleted

## 2015-08-23 NOTE — Telephone Encounter (Signed)
Patient called asking for US carotid results

## 2015-08-24 ENCOUNTER — Encounter: Payer: Self-pay | Admitting: Physician Assistant

## 2015-08-24 ENCOUNTER — Ambulatory Visit (INDEPENDENT_AMBULATORY_CARE_PROVIDER_SITE_OTHER): Payer: Medicaid Other | Admitting: Physician Assistant

## 2015-08-24 ENCOUNTER — Ambulatory Visit: Payer: Medicaid Other | Admitting: Gastroenterology

## 2015-08-24 VITALS — BP 118/56 | HR 87 | Ht 65.0 in | Wt 147.0 lb

## 2015-08-24 DIAGNOSIS — M542 Cervicalgia: Secondary | ICD-10-CM

## 2015-08-24 LAB — CBC
HEMATOCRIT: 37.7 % (ref 36.0–46.0)
Hemoglobin: 12.9 g/dL (ref 12.0–15.0)
MCH: 29.9 pg (ref 26.0–34.0)
MCHC: 34.2 g/dL (ref 30.0–36.0)
MCV: 87.3 fL (ref 78.0–100.0)
MPV: 9.7 fL (ref 8.6–12.4)
PLATELETS: 335 10*3/uL (ref 150–400)
RBC: 4.32 MIL/uL (ref 3.87–5.11)
RDW: 12.9 % (ref 11.5–15.5)
WBC: 4.6 10*3/uL (ref 4.0–10.5)

## 2015-08-24 MED ORDER — ALPRAZOLAM 0.5 MG PO TABS: 0.5000 mg | ORAL_TABLET | Freq: Every evening | ORAL | Status: DC | PRN

## 2015-08-24 MED ORDER — CYCLOBENZAPRINE HCL 10 MG PO TABS: 10.0000 mg | ORAL_TABLET | Freq: Every day | ORAL | Status: DC

## 2015-08-24 MED ORDER — IBUPROFEN 800 MG PO TABS: 800.0000 mg | ORAL_TABLET | Freq: Three times a day (TID) | ORAL | Status: DC | PRN

## 2015-08-24 MED ORDER — PRAVASTATIN SODIUM 40 MG PO TABS: 40.0000 mg | ORAL_TABLET | Freq: Every day | ORAL | Status: DC

## 2015-08-24 MED ORDER — ESOMEPRAZOLE MAGNESIUM 40 MG PO CPDR: 40.0000 mg | DELAYED_RELEASE_CAPSULE | Freq: Every day | ORAL | Status: DC | PRN

## 2015-08-25 LAB — LIPID PANEL
CHOLESTEROL: 203 mg/dL — AB (ref 125–200)
HDL: 56 mg/dL (ref >=46)
LDL Cholesterol: 123 mg/dL (ref <130)
TRIGLYCERIDES: 120 mg/dL (ref <150)
Total CHOL/HDL Ratio: 3.6 Ratio (ref <=5.0)
VLDL: 24 mg/dL (ref <30)

## 2015-08-25 LAB — VITAMIN B12: VITAMIN B 12: 272 pg/mL (ref 211–911)

## 2015-08-25 LAB — TSH: TSH: 2.144 u[IU]/mL (ref 0.350–4.500)

## 2015-08-26 ENCOUNTER — Encounter: Payer: Self-pay | Admitting: Physician Assistant

## 2015-08-26 NOTE — Progress Notes (Signed)
   Subjective:    Patient ID: Jamie [name], female    DOB: September 15, 1950, 65 y.o.   MRN: [id number]  HPI Pt is a 65 yo female who presents to the clinic to discuss ongoing neck pain. Started a few days ago in her upper neck and radiates into upper back. Worse on right than left. She wonders if could be a blockage in her neck. Not tried anything to make better. Describes pain as dull and at times shooting. No radiation into bilateral arms.    Review of Systems  All other systems reviewed and are negative.      Objective:   Physical Exam  Constitutional: She appears well-developed and well-nourished.  HENT:  Head: Normocephalic and atraumatic.  Cardiovascular: Normal rate, regular rhythm and normal heart sounds.   Pulmonary/Chest: Effort normal and breath sounds normal.  Musculoskeletal:  ROM is normal.  No pain over c-spine to palpation.  Tenderness over right occipital region and down into upper back.  Normal ROM of bilateral upper extremity.   Neurological: She is alert.  Psychiatric: She has a normal mood and affect. Her behavior is normal.          Assessment & Plan:  Neck pain- likely muscular. Given flexeril. Discuss ROM exercises. Pt declined imaging. Start ibuprofen up to three times a day. Warm compresses. Consider massage. Follow up if not improving.

## 2015-08-31 ENCOUNTER — Encounter: Payer: Medicaid Other | Admitting: Gastroenterology

## 2015-08-31 ENCOUNTER — Ambulatory Visit (INDEPENDENT_AMBULATORY_CARE_PROVIDER_SITE_OTHER): Payer: Medicaid Other

## 2015-08-31 ENCOUNTER — Telehealth: Payer: Self-pay | Admitting: Gastroenterology

## 2015-08-31 ENCOUNTER — Encounter: Payer: Self-pay | Admitting: Physician Assistant

## 2015-08-31 ENCOUNTER — Other Ambulatory Visit: Payer: Self-pay | Admitting: Physician Assistant

## 2015-08-31 DIAGNOSIS — M542 Cervicalgia: Secondary | ICD-10-CM | POA: Diagnosis not present

## 2015-08-31 DIAGNOSIS — M5030 Other cervical disc degeneration, unspecified cervical region: Secondary | ICD-10-CM | POA: Insufficient documentation

## 2015-08-31 HISTORY — DX: Other cervical disc degeneration, unspecified cervical region: M50.30

## 2015-08-31 NOTE — Telephone Encounter (Signed)
Seems to have been clearly explained at her OV 2 months ago, she was given paperwork about the day, time, prep for colonoscopy, etc.  Please charge the no-show fee

## 2015-08-31 NOTE — Progress Notes (Signed)
This encounter was created in error - please disregard.

## 2015-09-01 ENCOUNTER — Telehealth: Payer: Self-pay

## 2015-09-01 NOTE — Telephone Encounter (Signed)
I ordered and resulted on Wednesday.

## 2015-09-01 NOTE — Telephone Encounter (Signed)
Dianna called and left a message asking for a neck xray. Please advise.

## 2015-09-05 ENCOUNTER — Ambulatory Visit: Payer: Medicaid Other | Admitting: Physician Assistant

## 2015-09-08 ENCOUNTER — Encounter: Payer: Medicaid Other | Admitting: Family Medicine

## 2015-09-08 ENCOUNTER — Telehealth: Payer: Self-pay | Admitting: *Deleted

## 2015-09-08 ENCOUNTER — Encounter: Payer: Self-pay | Admitting: Family Medicine

## 2015-09-08 ENCOUNTER — Ambulatory Visit (INDEPENDENT_AMBULATORY_CARE_PROVIDER_SITE_OTHER): Payer: Medicaid Other | Admitting: Family Medicine

## 2015-09-08 VITALS — BP 120/76 | HR 99

## 2015-09-08 DIAGNOSIS — D510 Vitamin B12 deficiency anemia due to intrinsic factor deficiency: Secondary | ICD-10-CM | POA: Diagnosis not present

## 2015-09-08 MED ORDER — CYANOCOBALAMIN 1000 MCG/ML IJ SOLN
1000.0000 ug | Freq: Once | INTRAMUSCULAR | Status: AC
  Administered 2015-09-08: 1000 ug via INTRAMUSCULAR

## 2015-09-08 NOTE — Progress Notes (Signed)
[name] was scheduled for an office visit on 09/08/2015 at 2:15 with Dr. Georgina Snell for neck pain. Pt came back with the patient that was scheduled at 1:45 as they are related. Dr. Georgina Snell examined and discharged the 1:45 patient and went to see the 2:00 patient with all intentions of returning to examine Jamie [name] at her scheduled appointment time. I explained to Jamie [name] that Dr. Georgina Snell return shortly. [name] then questioned how long it would be and stated that could not wait that long because she had to go to work. I advised her that Dr. Georgina Snell would be in to see her during her scheduled time. She then requested to have the appointment canceled and left the office.

## 2015-09-08 NOTE — Telephone Encounter (Signed)
Called the pharmacy and spoke w/Lacy and informed them that pt's medication should be process as brand NOT generic.Audelia Hives [place]

## 2015-09-08 NOTE — Telephone Encounter (Signed)
Called pt's plan and spoke with Patty concerning pt's PA for nexium she informed me that this medication does not need a PA and should be processed for BRAND. Jamie [name], [place] Crocker

## 2015-09-08 NOTE — Progress Notes (Signed)
   Subjective:    Patient ID: Jamie [name], female    DOB: 1950/09/20, 65 y.o.   MRN: [id number]  HPI  Patient comes in for Vit. B injection. Denies any problems or concerns   Review of Systems     Objective:   Physical Exam  BP 120/76 mmHg  Pulse 99       Assessment & Plan:   Gave injection in left deltoid, patient tolerated well without complications. Advised to make return appointment in 30 days.   Agree with above.   Beatrice Lecher, MD

## 2015-09-14 ENCOUNTER — Telehealth: Payer: Self-pay | Admitting: Physician Assistant

## 2015-09-14 NOTE — Telephone Encounter (Signed)
Pt was seen last week and never got Nexium called in to Pharmacy

## 2015-09-16 NOTE — Telephone Encounter (Signed)
Spoke with pharmacist and the nexium is requiring a prior authorization.

## 2015-09-25 ENCOUNTER — Emergency Department (HOSPITAL_BASED_OUTPATIENT_CLINIC_OR_DEPARTMENT_OTHER): Payer: Medicaid Other

## 2015-09-25 ENCOUNTER — Emergency Department (HOSPITAL_BASED_OUTPATIENT_CLINIC_OR_DEPARTMENT_OTHER)
Admission: EM | Admit: 2015-09-25 | Discharge: 2015-09-26 | Disposition: A | Discharge status: Home / Self Care | Payer: Medicaid Other | Attending: Emergency Medicine | Admitting: Emergency Medicine

## 2015-09-25 ENCOUNTER — Encounter (HOSPITAL_BASED_OUTPATIENT_CLINIC_OR_DEPARTMENT_OTHER): Payer: Self-pay | Admitting: Emergency Medicine

## 2015-09-25 DIAGNOSIS — F329 Major depressive disorder, single episode, unspecified: Secondary | ICD-10-CM | POA: Diagnosis not present

## 2015-09-25 DIAGNOSIS — F419 Anxiety disorder, unspecified: Secondary | ICD-10-CM | POA: Diagnosis not present

## 2015-09-25 DIAGNOSIS — Z7982 Long term (current) use of aspirin: Secondary | ICD-10-CM | POA: Insufficient documentation

## 2015-09-25 DIAGNOSIS — Y998 Other external cause status: Secondary | ICD-10-CM | POA: Diagnosis not present

## 2015-09-25 DIAGNOSIS — Z87891 Personal history of nicotine dependence: Secondary | ICD-10-CM | POA: Insufficient documentation

## 2015-09-25 DIAGNOSIS — E785 Hyperlipidemia, unspecified: Secondary | ICD-10-CM | POA: Insufficient documentation

## 2015-09-25 DIAGNOSIS — S161XXA Strain of muscle, fascia and tendon at neck level, initial encounter: Secondary | ICD-10-CM | POA: Diagnosis not present

## 2015-09-25 DIAGNOSIS — Z79899 Other long term (current) drug therapy: Secondary | ICD-10-CM | POA: Diagnosis not present

## 2015-09-25 DIAGNOSIS — Y9301 Activity, walking, marching and hiking: Secondary | ICD-10-CM | POA: Insufficient documentation

## 2015-09-25 DIAGNOSIS — S39012A Strain of muscle, fascia and tendon of lower back, initial encounter: Secondary | ICD-10-CM

## 2015-09-25 DIAGNOSIS — Y92481 Parking lot as the place of occurrence of the external cause: Secondary | ICD-10-CM | POA: Insufficient documentation

## 2015-09-25 DIAGNOSIS — K589 Irritable bowel syndrome without diarrhea: Secondary | ICD-10-CM | POA: Diagnosis not present

## 2015-09-25 DIAGNOSIS — S199XXA Unspecified injury of neck, initial encounter: Secondary | ICD-10-CM | POA: Diagnosis present
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
## 2015-09-25 NOTE — ED Notes (Signed)
Pt to CT/x-ray.

## 2015-09-25 NOTE — ED Notes (Signed)
Dr. Molpus into room 

## 2015-09-26 MED ORDER — CYCLOBENZAPRINE HCL 10 MG PO TABS: 10.0000 mg | ORAL_TABLET | Freq: Once | ORAL | Status: DC

## 2015-09-26 MED ORDER — IBUPROFEN 800 MG PO TABS: 800.0000 mg | ORAL_TABLET | Freq: Once | ORAL | Status: DC

## 2015-09-26 MED ORDER — ONDANSETRON 8 MG PO TBDP
8.0000 mg | ORAL_TABLET | Freq: Once | ORAL | Status: AC
  Administered 2015-09-26: 8 mg via ORAL
  Filled 2015-09-26: qty 1

## 2015-09-26 MED ORDER — IBUPROFEN 800 MG PO TABS: 800.0000 mg | ORAL_TABLET | Freq: Three times a day (TID) | ORAL | Status: DC | PRN

## 2015-09-26 MED ORDER — CYCLOBENZAPRINE HCL 10 MG PO TABS: 10.0000 mg | ORAL_TABLET | Freq: Three times a day (TID) | ORAL | Status: DC | PRN

## 2015-09-26 NOTE — Discharge Instructions (Signed)
Cervical Sprain  A cervical sprain is an injury in the neck in which the strong, fibrous tissues (ligaments) that connect your neck bones stretch or tear. Cervical sprains can range from mild to severe. Severe cervical sprains can cause the neck vertebrae to be unstable. This can lead to damage of the spinal cord and can result in serious nervous system problems. The amount of time it takes for a cervical sprain to get better depends on the cause and extent of the injury. Most cervical sprains heal in 1 to 3 weeks.  CAUSES   Severe cervical sprains may be caused by:    Contact sport injuries (such as from football, rugby, wrestling, hockey, auto racing, gymnastics, diving, martial arts, or boxing).    Motor vehicle collisions.    Whiplash injuries. This is an injury from a sudden forward and backward whipping movement of the head and neck.   Falls.   Mild cervical sprains may be caused by:    Being in an awkward position, such as while cradling a telephone between your ear and shoulder.    Sitting in a chair that does not offer proper support.    Working at a poorly designed computer station.    Looking up or down for long periods of time.   SYMPTOMS    Pain, soreness, stiffness, or a burning sensation in the front, back, or sides of the neck. This discomfort may develop immediately after the injury or slowly, 24 hours or more after the injury.    Pain or tenderness directly in the middle of the back of the neck.    Shoulder or upper back pain.    Limited ability to move the neck.    Headache.    Dizziness.    Weakness, numbness, or tingling in the hands or arms.    Muscle spasms.    Difficulty swallowing or chewing.    Tenderness and swelling of the neck.   DIAGNOSIS   Most of the time your health care provider can diagnose a cervical sprain by taking your history and doing a physical exam. Your health care provider will ask about previous neck injuries and any known neck  problems, such as arthritis in the neck. X-rays may be taken to find out if there are any other problems, such as with the bones of the neck. Other tests, such as a CT scan or MRI, may also be needed.   TREATMENT   Treatment depends on the severity of the cervical sprain. Mild sprains can be treated with rest, keeping the neck in place (immobilization), and pain medicines. Severe cervical sprains are immediately immobilized. Further treatment is done to help with pain, muscle spasms, and other symptoms and may include:   Medicines, such as pain relievers, numbing medicines, or muscle relaxants.    Physical therapy. This may involve stretching exercises, strengthening exercises, and posture training. Exercises and improved posture can help stabilize the neck, strengthen muscles, and help stop symptoms from returning.   HOME CARE INSTRUCTIONS    Put ice on the injured area.     Put ice in a plastic bag.     Place a towel between your skin and the bag.     Leave the ice on for 15-20 minutes, 3-4 times a day.    If your injury was severe, you may have been given a cervical collar to wear. A cervical collar is a two-piece collar designed to keep your neck from moving while it heals.      Do not remove the collar unless instructed by your health care provider.    If you have long hair, keep it outside of the collar.    Ask your health care provider before making any adjustments to your collar. Minor adjustments may be required over time to improve comfort and reduce pressure on your chin or on the back of your head.    Ifyou are allowed to remove the collar for cleaning or bathing, follow your health care provider's instructions on how to do so safely.    Keep your collar clean by wiping it with mild soap and water and drying it completely. If the collar you have been given includes removable pads, remove them every 1-2 days and hand wash them with soap and water. Allow them to air dry. They should be completely  dry before you wear them in the collar.    If you are allowed to remove the collar for cleaning and bathing, wash and dry the skin of your neck. Check your skin for irritation or sores. If you see any, tell your health care provider.    Do not drive while wearing the collar.    Only take over-the-counter or prescription medicines for pain, discomfort, or fever as directed by your health care provider.    Keep all follow-up appointments as directed by your health care provider.    Keep all physical therapy appointments as directed by your health care provider.    Make any needed adjustments to your workstation to promote good posture.    Avoid positions and activities that make your symptoms worse.    Warm up and stretch before being active to help prevent problems.   SEEK MEDICAL CARE IF:    Your pain is not controlled with medicine.    You are unable to decrease your pain medicine over time as planned.    Your activity level is not improving as expected.   SEEK IMMEDIATE MEDICAL CARE IF:    You develop any bleeding.   You develop stomach upset.   You have signs of an allergic reaction to your medicine.    Your symptoms get worse.    You develop new, unexplained symptoms.    You have numbness, tingling, weakness, or paralysis in any part of your body.   MAKE SURE YOU:    Understand these instructions.   Will watch your condition.   Will get help right away if you are not doing well or get worse.     This information is not intended to replace advice given to you by your health care provider. Make sure you discuss any questions you have with your health care provider.     Document Released: 05/27/2007 Document Revised: 08/04/2013 Document Reviewed: 02/04/2013  [employer] Interactive Patient Education 2016 [employer]

## 2015-10-10 ENCOUNTER — Encounter: Payer: Self-pay | Admitting: Physician Assistant

## 2015-10-10 ENCOUNTER — Ambulatory Visit (INDEPENDENT_AMBULATORY_CARE_PROVIDER_SITE_OTHER): Payer: Medicaid Other | Admitting: Physician Assistant

## 2015-10-10 ENCOUNTER — Ambulatory Visit: Payer: Medicaid Other | Admitting: Physician Assistant

## 2015-10-10 VITALS — BP 115/60 | HR 78 | Ht 65.0 in | Wt 147.0 lb

## 2015-10-10 DIAGNOSIS — R208 Other disturbances of skin sensation: Secondary | ICD-10-CM | POA: Diagnosis not present

## 2015-10-10 DIAGNOSIS — H04123 Dry eye syndrome of bilateral lacrimal glands: Secondary | ICD-10-CM | POA: Diagnosis not present

## 2015-10-10 DIAGNOSIS — M5030 Other cervical disc degeneration, unspecified cervical region: Secondary | ICD-10-CM

## 2015-10-10 DIAGNOSIS — W51XXXA Accidental striking against or bumped into by another person, initial encounter: Secondary | ICD-10-CM | POA: Diagnosis not present

## 2015-10-10 DIAGNOSIS — D510 Vitamin B12 deficiency anemia due to intrinsic factor deficiency: Secondary | ICD-10-CM

## 2015-10-10 DIAGNOSIS — R200 Anesthesia of skin: Secondary | ICD-10-CM

## 2015-10-10 DIAGNOSIS — M542 Cervicalgia: Secondary | ICD-10-CM | POA: Diagnosis not present

## 2015-10-10 DIAGNOSIS — E039 Hypothyroidism, unspecified: Secondary | ICD-10-CM

## 2015-10-10 MED ORDER — PREDNISONE 50 MG PO TABS: ORAL_TABLET | ORAL | Status: DC

## 2015-10-10 MED ORDER — TRAMADOL HCL 50 MG PO TABS: 50.0000 mg | ORAL_TABLET | Freq: Three times a day (TID) | ORAL | Status: DC | PRN

## 2015-10-10 MED ORDER — CYCLOSPORINE 0.05 % OP EMUL: 1.0000 [drp] | Freq: Two times a day (BID) | OPHTHALMIC | Status: DC
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
## 2015-10-10 NOTE — Patient Instructions (Signed)
4/26 get labs that I am printing today.

## 2015-10-13 ENCOUNTER — Encounter: Payer: Self-pay | Admitting: Physician Assistant

## 2015-10-13 DIAGNOSIS — M542 Cervicalgia: Secondary | ICD-10-CM | POA: Insufficient documentation

## 2015-10-13 DIAGNOSIS — W51XXXA Accidental striking against or bumped into by another person, initial encounter: Secondary | ICD-10-CM | POA: Insufficient documentation

## 2015-10-13 DIAGNOSIS — H04129 Dry eye syndrome of unspecified lacrimal gland: Secondary | ICD-10-CM

## 2015-10-13 DIAGNOSIS — R200 Anesthesia of skin: Secondary | ICD-10-CM | POA: Insufficient documentation

## 2015-10-13 DIAGNOSIS — D510 Vitamin B12 deficiency anemia due to intrinsic factor deficiency: Secondary | ICD-10-CM

## 2015-10-13 HISTORY — DX: Vitamin B12 deficiency anemia due to intrinsic factor deficiency: D51.0

## 2015-10-13 HISTORY — DX: Dry eye syndrome of unspecified lacrimal gland: H04.129

## 2015-10-13 HISTORY — DX: Cervicalgia: M54.2

## 2015-10-13 HISTORY — DX: Anesthesia of skin: R20.0

## 2015-10-17 ENCOUNTER — Ambulatory Visit (INDEPENDENT_AMBULATORY_CARE_PROVIDER_SITE_OTHER): Payer: Medicaid Other

## 2015-10-17 ENCOUNTER — Ambulatory Visit (INDEPENDENT_AMBULATORY_CARE_PROVIDER_SITE_OTHER): Payer: Medicaid Other | Admitting: Family Medicine

## 2015-10-17 ENCOUNTER — Encounter: Payer: Self-pay | Admitting: Family Medicine

## 2015-10-17 VITALS — BP 121/74 | Wt 145.0 lb

## 2015-10-17 DIAGNOSIS — G5600 Carpal tunnel syndrome, unspecified upper limb: Secondary | ICD-10-CM | POA: Insufficient documentation

## 2015-10-17 DIAGNOSIS — M7500 Adhesive capsulitis of unspecified shoulder: Secondary | ICD-10-CM

## 2015-10-17 DIAGNOSIS — M7502 Adhesive capsulitis of left shoulder: Secondary | ICD-10-CM

## 2015-10-17 DIAGNOSIS — G5602 Carpal tunnel syndrome, left upper limb: Secondary | ICD-10-CM | POA: Diagnosis not present

## 2015-10-17 HISTORY — DX: Carpal tunnel syndrome, unspecified upper limb: G56.00

## 2015-10-17 HISTORY — DX: Adhesive capsulitis of unspecified shoulder: M75.00

## 2015-10-17 NOTE — Assessment & Plan Note (Signed)
I believe the patient has developed adhesive capsulitis. Because there is a injury there is some concern for a inside a rotator cuff tear. Plan for MRI of the left shoulder. Return following MRI. Discussed options including hydrodissection and corticosteroid injection.
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
## 2015-10-17 NOTE — Assessment & Plan Note (Signed)
Patient likely has carpal tunnel syndrome of the left wrist. Plan for night splint. Recheck in a week or 2.

## 2015-10-17 NOTE — Patient Instructions (Signed)
Thank you for coming in today. Return following MRI.   Adhesive Capsulitis Adhesive capsulitis is inflammation of the tendons and ligaments that surround the shoulder joint (shoulder capsule). This condition causes the shoulder to become stiff and painful to move. Adhesive capsulitis is also called frozen shoulder. CAUSES This condition may be caused by:  An injury to the shoulder joint.  Straining the shoulder.  Not moving the shoulder for a period of time. This can happen if your arm was injured or in a sling.  Long-standing health problems, such as:  Diabetes.  Thyroid problems.  Heart disease.  Stroke.  Rheumatoid arthritis.  Lung disease. In some cases, the cause may not be known. RISK FACTORS This condition is more likely to develop in:  Women.  People who are older than 63 years of age. SYMPTOMS Symptoms of this condition include:  Pain in the shoulder when moving the arm. There may also be pain when parts of the shoulder are touched. The pain is worse at night or when at rest.  Soreness or aching in the shoulder.  Inability to move the shoulder normally.  Muscle spasms. DIAGNOSIS This condition is diagnosed with a physical exam and imaging tests, such as an X-ray or MRI. TREATMENT This condition may be treated with:  Treatment of the underlying cause or condition.  Physical therapy. This involves performing exercises to get the shoulder moving again.  Medicine. Medicine may be given to relieve pain, inflammation, or muscle spasms.  Steroid injections into the shoulder joint.  Shoulder manipulation. This is a procedure to move the shoulder into another position. It is done after you are given a medicine to make you fall asleep (general anesthetic). The joint may also be injected with salt water at high pressure to break down scarring.  Surgery. This may be done in severe cases when other treatments have failed. Although most people recover completely  from adhesive capsulitis, some may not regain the full movement of the shoulder. HOME CARE INSTRUCTIONS  Take over-the-counter and prescription medicines only as told by your health care provider.  If you are being treated with physical therapy, follow instructions from your physical therapist.  Avoid exercises that put a lot of demand on your shoulder, such as throwing. These exercises can make pain worse.  If directed, apply ice to the injured area:  Put ice in a plastic bag.  Place a towel between your skin and the bag.  Leave the ice on for 20 minutes, 2-3 times per day. SEEK MEDICAL CARE IF:  You develop new symptoms.  Your symptoms get worse.   This information is not intended to replace advice given to you by your health care provider. Make sure you discuss any questions you have with your health care provider.   Document Released: 05/27/2009 Document Revised: 04/20/2015 Document Reviewed: 11/22/2014 Elsevier Interactive Patient Education [employer].

## 2015-10-18 NOTE — Progress Notes (Signed)
Quick Note:  Xray was normal. ______ 

## 2015-10-24 ENCOUNTER — Ambulatory Visit (INDEPENDENT_AMBULATORY_CARE_PROVIDER_SITE_OTHER): Payer: Medicaid Other

## 2015-10-24 DIAGNOSIS — M7502 Adhesive capsulitis of left shoulder: Secondary | ICD-10-CM | POA: Diagnosis not present

## 2015-10-25 NOTE — Progress Notes (Signed)
Quick Note:  MRI shows some tendonitis, and arthritis but no tears. I think the most likely cause of pain is frozen shoulder. Injection and physical therapy should help. ______

## 2015-11-01 ENCOUNTER — Ambulatory Visit (INDEPENDENT_AMBULATORY_CARE_PROVIDER_SITE_OTHER): Payer: Medicaid Other | Admitting: Family Medicine

## 2015-11-01 ENCOUNTER — Encounter: Payer: Self-pay | Admitting: Family Medicine

## 2015-11-01 ENCOUNTER — Telehealth: Payer: Self-pay

## 2015-11-01 VITALS — BP 116/77 | HR 100 | Wt 148.0 lb

## 2015-11-01 DIAGNOSIS — IMO0002 Reserved for concepts with insufficient information to code with codable children: Secondary | ICD-10-CM

## 2015-11-01 DIAGNOSIS — M7502 Adhesive capsulitis of left shoulder: Secondary | ICD-10-CM | POA: Diagnosis not present

## 2015-11-01 LAB — GLUCOSE, POCT (MANUAL RESULT ENTRY): POC GLUCOSE: 108 mg/dL — AB (ref 70–99)

## 2015-11-01 NOTE — Patient Instructions (Signed)
Thank you for coming in today. Return in 2-3 weeks  Work on the home exercise programs.   Call or go to the ER if you develop a large red swollen joint with extreme pain or oozing puss.   Adhesive Capsulitis Adhesive capsulitis is inflammation of the tendons and ligaments that surround the shoulder joint (shoulder capsule). This condition causes the shoulder to become stiff and painful to move. Adhesive capsulitis is also called frozen shoulder. CAUSES This condition may be caused by: 1. An injury to the shoulder joint. 2. Straining the shoulder. 3. Not moving the shoulder for a period of time. This can happen if your arm was injured or in a sling. 4. Long-standing health problems, such as: 1. Diabetes. 2. Thyroid problems. 3. Heart disease. 4. Stroke. 5. Rheumatoid arthritis. 6. Lung disease. In some cases, the cause may not be known. RISK FACTORS This condition is more likely to develop in: 1. Women. 2. People who are older than 63 years of age. SYMPTOMS Symptoms of this condition include: 1. Pain in the shoulder when moving the arm. There may also be pain when parts of the shoulder are touched. The pain is worse at night or when at rest. 2. Soreness or aching in the shoulder. 3. Inability to move the shoulder normally. 4. Muscle spasms. DIAGNOSIS This condition is diagnosed with a physical exam and imaging tests, such as an X-ray or MRI. TREATMENT This condition may be treated with: 1. Treatment of the underlying cause or condition. 2. Physical therapy. This involves performing exercises to get the shoulder moving again. 3. Medicine. Medicine may be given to relieve pain, inflammation, or muscle spasms. 4. Steroid injections into the shoulder joint. 5. Shoulder manipulation. This is a procedure to move the shoulder into another position. It is done after you are given a medicine to make you fall asleep (general anesthetic). The joint may also be injected with salt water at  high pressure to break down scarring. 6. Surgery. This may be done in severe cases when other treatments have failed. Although most people recover completely from adhesive capsulitis, some may not regain the full movement of the shoulder. HOME CARE INSTRUCTIONS 1. Take over-the-counter and prescription medicines only as told by your health care provider. 2. If you are being treated with physical therapy, follow instructions from your physical therapist. 3. Avoid exercises that put a lot of demand on your shoulder, such as throwing. These exercises can make pain worse. 4. If directed, apply ice to the injured area: 1. Put ice in a plastic bag. 2. Place a towel between your skin and the bag. 3. Leave the ice on for 20 minutes, 2-3 times per day. SEEK MEDICAL CARE IF: 1. You develop new symptoms. 2. Your symptoms get worse.   This information is not intended to replace advice given to you by your health care provider. Make sure you discuss any questions you have with your health care provider.   Document Released: 05/27/2009 Document Revised: 04/20/2015 Document Reviewed: 11/22/2014 Elsevier Interactive Patient Education 2016 Else[employer] Inc[employer]   Shoulder Range of Motion Exercises Shoulder range of motion (ROM) exercises are designed to keep the shoulder moving freely. They are often recommended for people who have shoulder pain. MOVEMENT EXERCISE When you are able, do this exercise 5-6 days per week, or as told by your health care provider. Work toward doing 2 sets of 10 swings. Pendulum Exercise How To Do This Exercise Lying Down 5. Lie face-down on a bed with  your abdomen close to the side of the bed. 6. Let your arm hang over the side of the bed. 7. Relax your shoulder, arm, and hand. 8. Slowly and gently swing your arm forward and back. Do not use your neck muscles to swing your arm. They should be relaxed. If you are struggling to swing your arm, have someone gently swing it for you.  When you do this exercise for the first time, swing your arm at a 15 degree angle for 15 seconds, or swing your arm 10 times. As pain lessens over time, increase the angle of the swing to 30-45 degrees. 9. Repeat steps 1-4 with the other arm. How To Do This Exercise While Standing 3. Stand next to a sturdy chair or table and hold on to it with your hand.  Bend forward at the waist.  Bend your knees slightly.  Relax your other arm and let it hang limp.  Relax the shoulder blade of the arm that is hanging and let it drop.  While keeping your shoulder relaxed, use body motion to swing your arm in small circles. The first time you do this exercise, swing your arm for about 30 seconds or 10 times. When you do it next time, swing your arm for a little longer.  Stand up tall and relax.  Repeat steps 1-7, this time changing the direction of the circles. 4. Repeat steps 1-8 with the other arm. STRETCHING EXERCISES Do these exercises 3-4 times per day on 5-6 days per week or as told by your health care provider. Work toward holding the stretch for 20 seconds. Stretching Exercise 1 5. Lift your arm straight out in front of you. 6. Bend your arm 90 degrees at the elbow (right angle) so your forearm goes across your body and looks like the letter "L." 7. Use your other arm to gently pull the elbow forward and across your body. 8. Repeat steps 1-3 with the other arm. Stretching Exercise 2 You will need a towel or rope for this exercise. 7. Bend one arm behind your back with the palm facing outward. 8. Hold a towel with your other hand. 9. Reach the arm that holds the towel above your head, and bend that arm at the elbow. Your wrist should be behind your neck. 10. Use your free hand to grab the free end of the towel. 11. With the higher hand, gently pull the towel up behind you. 12. With the lower hand, pull the towel down behind you. 13. Repeat steps 1-6 with the other arm. STRENGTHENING  EXERCISES Do each of these exercises at four different times of day (sessions) every day or as told by your health care provider. To begin with, repeat each exercise 5 times (repetitions). Work toward doing 3 sets of 12 repetitions or as told by your health care provider. Strengthening Exercise 1 You will need a light weight for this activity. As you grow stronger, you may use a heavier weight. 5. Standing with a weight in your hand, lift your arm straight out to the side until it is at the same height as your shoulder. 6. Bend your arm at 90 degrees so that your fingers are pointing to the ceiling. 7. Slowly raise your hand until your arm is straight up in the air. 8. Repeat steps 1-3 with the other arm. Strengthening Exercise 2 You will need a light weight for this activity. As you grow stronger, you may use a heavier weight. 3. Standing with  a weight in your hand, gradually move your straight arm in an arc, starting at your side, then out in front of you, then straight up over your head. 4. Gradually move your other arm in an arc, starting at your side, then out in front of you, then straight up over your head. 5. Repeat steps 1-2 with the other arm. Strengthening Exercise 3 You will need an elastic band for this activity. As you grow stronger, gradually increase the size of the bands or increase the number of bands that you use at one time. 1. While standing, hold an elastic band in one hand and raise that arm up in the air. 2. With your other hand, pull down the band until that hand is by your side. 3. Repeat steps 1-2 with the other arm.   This information is not intended to replace advice given to you by your health care provider. Make sure you discuss any questions you have with your health care provider.   Document Released: 04/28/2003 Document Revised: 12/14/2014 Document Reviewed: 07/26/2014 Elsevier Interactive Patient Education [employer].

## 2015-11-01 NOTE — Assessment & Plan Note (Signed)
Plan injection today. Home exercise program. Patient cannot do physical therapy because she has Medicaid. Recheck in 2-3 weeks.

## 2015-11-01 NOTE — Telephone Encounter (Signed)
Pt was in today for an office visit and mentioned that she has diverticulitis and needs a rx for an antibiotic to clear infection before she can get a colonoscopy. She would like the rx sent to her pharmacy on file. She would also like an order placed for a colonoscopy.

## 2015-11-01 NOTE — Progress Notes (Signed)
Jamie [name] is a 65 y.o. female who presents to [facility] today for follow-up left shoulder pain. Patient was seen on 10/17/2015 where she was thought to have adhesive capsulitis. MRI obtained on 10/24/2015 showed no significant rotator cuff tear and thickened ligaments consistent with these of capsulitis. She has done some home exercises which have not helped.    Past Medical History  Diagnosis Date  . Anxiety   . Hyperlipidemia   . Diverticulosis   . IBS (irritable bowel syndrome)   . Depression   . Diverticulitis   . Headache   . Lung mass   . Syncope 03/08/2015   Past Surgical History  Procedure Laterality Date  . Abdominal hysterectomy    . Tubal ligation    . Hemorrhoid surgery    . Tumor removal      Left thigh    Social History  Substance Use Topics  . Smoking status: Former Smoker -- 0.80 packs/day for 20 years    Types: Cigarettes    Quit date: 08/13/1985  . Smokeless tobacco: Never Used  . Alcohol Use: Yes     Comment: rare   family history includes Alcohol abuse in her sister; Arthritis in her sister; Cancer in her mother and sister; Diabetes in her mother; Fibromyalgia in her sister; Heart disease in her brother and father; Irritable bowel syndrome in her sister.  ROS as above Medications: Current Outpatient Prescriptions  Medication Sig Dispense Refill  . ALOE VERA PO Take 1 Dose by mouth 2 (two) times daily. 3oz twice daily    . ALPRAZolam (XANAX) 0.5 MG tablet Take 1 tablet (0.5 mg total) by mouth at bedtime as needed for anxiety. 30 tablet 5  . aspirin EC 81 MG tablet Take 81 mg by mouth daily.    . cyclobenzaprine (FLEXERIL) 10 MG tablet Take 1 tablet (10 mg total) by mouth 3 (three) times daily as needed for muscle spasms. 20 tablet 0  . cycloSPORINE (RESTASIS) 0.05 % ophthalmic emulsion Place 1 drop into both eyes 2 (two) times daily. 0.4 mL 2    . esomeprazole (NEXIUM) 40 MG capsule Take 1 capsule (40 mg total) by mouth daily as needed (for acid reflux). 30 capsule 3  . ibuprofen (ADVIL,MOTRIN) 800 MG tablet Take 1 tablet (800 mg total) by mouth every 8 (eight) hours as needed (for pain). 20 tablet 0  . levothyroxine (SYNTHROID, LEVOTHROID) 50 MCG tablet Take 1 tablet (50 mcg total) by mouth daily before breakfast. 90 tablet 0  . Linaclotide (LINZESS) 145 MCG CAPS capsule Take 1 capsule (145 mcg total) by mouth daily. 30 capsule 5  . pravastatin (PRAVACHOL) 40 MG tablet Take 1 tablet (40 mg total) by mouth daily. 30 tablet 6  . predniSONE (DELTASONE) 50 MG tablet Take one tablet daily. 5 tablet 0  . saccharomyces boulardii (FLORASTOR) 250 MG capsule Take 1 capsule (250 mg total) by mouth 2 (two) times daily. 120 capsule 0  . traMADol (ULTRAM) 50 MG tablet Take 1 tablet (50 mg total) by mouth every 8 (eight) hours as needed. 30 tablet 0   No current facility-administered medications for this visit.   Allergies  Allergen Reactions  . Ciprofloxacin     Facial numbness  . Peanut Butter Flavor Nausea And Vomiting    N/V  . Shellfish Allergy Swelling     Exam:  BP 116/77 mmHg  Pulse 100  Wt 148 lb (67.132 kg) Gen: Well  NAD Left shoulder: Normal-appearing nontender. External rotation limited to 20 beyond neutral position Internal rotation limited to the iliac crest Abduction limited to 70 Pulses capillary refill and sensation intact  Procedure: Real-time Ultrasound Guided Injection of Left GH injection  Device: GE Logiq E  Images permanently stored and available for review in the ultrasound unit. Verbal informed consent obtained. Discussed risks and benefits of procedure. Warned about infection bleeding damage to structures skin hypopigmentation and fat atrophy among others. Patient expresses understanding and agreement Time-out conducted.  Noted no overlying erythema, induration, or other signs of local infection.   Skin prepped in a sterile fashion.  Local anesthesia: Topical Ethyl chloride.  With sterile technique and under real time ultrasound guidance: 20mg  kenalog and 72ml marcaine injected easily.  Completed without difficulty  Pain partially resolved suggesting accurate placement of the medication.  Following the injection patient became lightheaded. She was laid down and felt better. Blood sugar was checked at patient request. Prior to discharge she was feeling well.   Advised to call if fevers/chills, erythema, induration, drainage, or persistent bleeding.  Images permanently stored and available for review in the ultrasound unit.  Impression: Technically successful ultrasound guided injection.    Results for orders placed or performed in visit on 11/01/15 (from the past 24 hour(s))  POCT Glucose (CBG)     Status: Abnormal   Collection Time: 11/01/15 11:15 AM  Result Value Ref Range   POC Glucose 108 (A) 70 - 99 mg/dl   No results found.   Please see individual assessment and plan sections.

## 2015-11-02 NOTE — Telephone Encounter (Signed)
We can't just send antibiotic for diverticulitis. We have to at least document symptoms. Please schedule office visit to discuss this.

## 2015-11-02 NOTE — Telephone Encounter (Signed)
Left message on vm to schedule office visit

## 2015-11-03 ENCOUNTER — Ambulatory Visit (INDEPENDENT_AMBULATORY_CARE_PROVIDER_SITE_OTHER): Payer: Medicaid Other | Admitting: Family Medicine

## 2015-11-03 ENCOUNTER — Telehealth: Payer: Self-pay | Admitting: Physician Assistant

## 2015-11-03 ENCOUNTER — Encounter: Payer: Self-pay | Admitting: Family Medicine

## 2015-11-03 VITALS — BP 122/63 | HR 70 | Temp 98.3°F | Ht 65.0 in | Wt 148.0 lb

## 2015-11-03 DIAGNOSIS — R1013 Epigastric pain: Secondary | ICD-10-CM

## 2015-11-03 DIAGNOSIS — R350 Frequency of micturition: Secondary | ICD-10-CM

## 2015-11-03 LAB — POCT URINALYSIS DIPSTICK
BILIRUBIN UA: NEGATIVE
Blood, UA: NEGATIVE
GLUCOSE UA: NEGATIVE
KETONES UA: NEGATIVE
Nitrite, UA: NEGATIVE
Protein, UA: NEGATIVE
Urobilinogen, UA: 0.2
pH, UA: 6

## 2015-11-03 MED ORDER — SULFAMETHOXAZOLE-TRIMETHOPRIM 800-160 MG PO TABS: 1.0000 | ORAL_TABLET | Freq: Two times a day (BID) | ORAL | Status: DC

## 2015-11-03 NOTE — Telephone Encounter (Signed)
Pt called. She would like to be referred to another PT because the one she was previously referred to only would

## 2015-11-03 NOTE — Progress Notes (Signed)
Subjective:    Patient ID: Jamie [name], female    DOB: 12/13/1950, 65 y.o.   MRN: [id number]  HPI 65 year old female comes in today complaining of abdominal pain for the last 3 days. She feels a burning sensation in the lower abdomen, wraps around to her back. In fact she's wondering if it's diverticulitis again. She had a steroid injection in her left shoulder about 3 days ago and says she hasn't slept well since then. She's also had significant urinary frequency. Though she feels like she's in setting her bladder well. No dysuria. No fevers chills or sweats. She has had episodes where she suddenly felt hot and flushed mostly at night. She denies any blood in the urine.No vomiting, nausea or diarrhea. She actually has been a little bit more constipated the last few days. No blood in the stool or the urine. No worsening or alleviating factors.   Review of Systems  BP 122/63 mmHg  Pulse 70  Temp(Src) 98.3 F (36.8 C) (Oral)  Ht 5\' 5"  (1.651 m)  Wt 148 lb (67.132 kg)  BMI 24.63 kg/m2    Allergies  Allergen Reactions  . Ciprofloxacin     Facial numbness  . Peanut Butter Flavor Nausea And Vomiting    N/V  . Shellfish Allergy Swelling    Past Medical History  Diagnosis Date  . Anxiety   . Hyperlipidemia   . Diverticulosis   . IBS (irritable bowel syndrome)   . Depression   . Diverticulitis   . Headache   . Lung mass   . Syncope 03/08/2015    Past Surgical History  Procedure Laterality Date  . Abdominal hysterectomy    . Tubal ligation    . Hemorrhoid surgery    . Tumor removal      Left thigh     Social History   Social History  . Marital Status: Divorced    Spouse Name: N/A  . Number of Children: 3  . Years of Education: N/A   Occupational History  . Student    Social History Main Topics  . Smoking status: Former Smoker -- 0.80 packs/day for 20 years    Types: Cigarettes    Quit date: 08/13/1985  . Smokeless tobacco: Never Used  . Alcohol Use: Yes   Comment: rare  . Drug Use: No  . Sexual Activity: Not on file   Other Topics Concern  . Not on file   Social History Narrative   Lives with and grandchild (she is foster parent for grand child)Completed school full time.   Caffeine use; occasional   Works in Personal assistant, Economist.   In process of divorce.            Family History  Problem Relation Age of Onset  . Cancer Mother     renal cell carcinoma  . Diabetes Mother   . Heart disease Father     first MI in 53s  . Cancer Sister     cervical cancer  . Heart disease Brother     first MI in 36s  . Alcohol abuse Sister   . Fibromyalgia Sister   . Arthritis Sister     osteoarthritis  . Irritable bowel syndrome Sister   . Leukemia      grandson    Outpatient Encounter Prescriptions as of 11/03/2015  Medication Sig  . ALOE VERA PO Take 1 Dose by mouth 2 (two) times daily. 3oz twice daily  . ALPRAZolam Duanne Moron)  0.5 MG tablet Take 1 tablet (0.5 mg total) by mouth at bedtime as needed for anxiety.  Marland Kitchen aspirin EC 81 MG tablet Take 81 mg by mouth daily.  . cyclobenzaprine (FLEXERIL) 10 MG tablet Take 1 tablet (10 mg total) by mouth 3 (three) times daily as needed for muscle spasms.  . cycloSPORINE (RESTASIS) 0.05 % ophthalmic emulsion Place 1 drop into both eyes 2 (two) times daily.  Marland Kitchen esomeprazole (NEXIUM) 40 MG capsule Take 1 capsule (40 mg total) by mouth daily as needed (for acid reflux).  Marland Kitchen ibuprofen (ADVIL,MOTRIN) 800 MG tablet Take 1 tablet (800 mg total) by mouth every 8 (eight) hours as needed (for pain).  Marland Kitchen levothyroxine (SYNTHROID, LEVOTHROID) 50 MCG tablet Take 1 tablet (50 mcg total) by mouth daily before breakfast.  . Linaclotide (LINZESS) 145 MCG CAPS capsule Take 1 capsule (145 mcg total) by mouth daily.  . pravastatin (PRAVACHOL) 40 MG tablet Take 1 tablet (40 mg total) by mouth daily.  . predniSONE (DELTASONE) 50 MG tablet Take one tablet daily.  Marland Kitchen saccharomyces boulardii (FLORASTOR) 250 MG  capsule Take 1 capsule (250 mg total) by mouth 2 (two) times daily.  . traMADol (ULTRAM) 50 MG tablet Take 1 tablet (50 mg total) by mouth every 8 (eight) hours as needed.  . sulfamethoxazole-trimethoprim (BACTRIM DS,SEPTRA DS) 800-160 MG tablet Take 1 tablet by mouth 2 (two) times daily.   No facility-administered encounter medications on file as of 11/03/2015.          Objective:   Physical Exam  Constitutional: She is oriented to person, place, and time. She appears well-developed and well-nourished.  HENT:  Head: Normocephalic and atraumatic.  Cardiovascular: Normal rate, regular rhythm and normal heart sounds.   Pulmonary/Chest: Effort normal and breath sounds normal.  Abdominal: Soft. Bowel sounds are normal. She exhibits distension. She exhibits no mass. There is tenderness. There is no rebound and no guarding.  TTP over the epigastrum, RUQ and LUQ.  Nontender over the lower abdomen or pelvis.  Neurological: She is alert and oriented to person, place, and time.  Skin: Skin is warm and dry. No rash noted.  Psychiatric: She has a normal mood and affect. Her behavior is normal.          Assessment & Plan:   Urinary frequency-urinalysis was positive for leukocytes but negative for nitrites. We will send a culture for further evaluation. Start her on Bactrim for 3 days.  Upper abdominal pain-She's really only tender in the upper abdomen over the epigastric area right and left upper quadrants. She's not tender all over the lower abdomen or pelvis. I would like to get a CBC amylase and lipase as well as liver enzymes as well. Keratitis and hepatitis. Will call for results once available. Certainly consider diverticulitis but her presentation would be atypical.

## 2015-11-04 ENCOUNTER — Other Ambulatory Visit: Payer: Self-pay | Admitting: Physician Assistant

## 2015-11-04 ENCOUNTER — Telehealth: Payer: Self-pay

## 2015-11-04 LAB — AMYLASE: Amylase: 42 U/L (ref 0–105)

## 2015-11-04 LAB — COMPLETE METABOLIC PANEL WITH GFR
ALT: 17 U/L (ref 6–29)
AST: 20 U/L (ref 10–35)
Albumin: 4.7 g/dL (ref 3.6–5.1)
Alkaline Phosphatase: 54 U/L (ref 33–130)
BUN: 12 mg/dL (ref 7–25)
CHLORIDE: 102 mmol/L (ref 98–110)
CO2: 28 mmol/L (ref 20–31)
Calcium: 9.6 mg/dL (ref 8.6–10.4)
Creat: 0.8 mg/dL (ref 0.50–0.99)
GFR, EST NON AFRICAN AMERICAN: 79 mL/min (ref >=60)
GLUCOSE: 77 mg/dL (ref 65–99)
Potassium: 4.5 mmol/L (ref 3.5–5.3)
SODIUM: 140 mmol/L (ref 135–146)
TOTAL PROTEIN: 7.7 g/dL (ref 6.1–8.1)
Total Bilirubin: 0.5 mg/dL (ref 0.2–1.2)

## 2015-11-04 LAB — LIPASE: Lipase: 25 U/L (ref 7–60)

## 2015-11-04 MED ORDER — AMOXICILLIN-POT CLAVULANATE 875-125 MG PO TABS: 1.0000 | ORAL_TABLET | Freq: Two times a day (BID) | ORAL | Status: DC

## 2015-11-04 NOTE — Telephone Encounter (Signed)
Patient states she needs another medication for her diverticulitis. She states she cannot take tablets. She can only take capsules.

## 2015-11-05 LAB — URINE CULTURE
Colony Count: NO GROWTH
ORGANISM ID, BACTERIA: NO GROWTH

## 2015-11-07 NOTE — Telephone Encounter (Signed)
See if she can take liquid.

## 2015-11-08 ENCOUNTER — Other Ambulatory Visit: Payer: Self-pay | Admitting: Physician Assistant

## 2015-11-08 ENCOUNTER — Telehealth: Payer: Self-pay | Admitting: *Deleted

## 2015-11-08 DIAGNOSIS — L299 Pruritus, unspecified: Secondary | ICD-10-CM

## 2015-11-08 MED ORDER — AMOXICILLIN-POT CLAVULANATE 400-57 MG/5ML PO SUSR: 875.0000 mg | Freq: Two times a day (BID) | ORAL | Status: DC

## 2015-11-08 NOTE — Telephone Encounter (Signed)
Patient states she can take liquid.

## 2015-11-08 NOTE — Telephone Encounter (Signed)
Patient advised.

## 2015-11-08 NOTE — Telephone Encounter (Signed)
Pt states she would has been trying to get in to see an allergist and dermatologist for three months. A referral was placed for dermatologist and the notes were sent over to [facility]. Pt would like a referral to

## 2015-11-08 NOTE — Telephone Encounter (Signed)
i sent to pharmacy. Make pt aware.

## 2015-11-09 ENCOUNTER — Telehealth: Payer: Self-pay

## 2015-11-09 DIAGNOSIS — M7500 Adhesive capsulitis of unspecified shoulder: Secondary | ICD-10-CM

## 2015-11-09 LAB — CBC WITH DIFFERENTIAL/PLATELET

## 2015-11-09 NOTE — Telephone Encounter (Signed)
Manuela Schwartz from outpatient rehab at [facility] called stating that the patient has scheduled an appoint for pt and that they do accept medicaid at there location. If appropriate please place order for pt. If not, please let know so that I can inform Manuela Schwartz.

## 2015-11-09 NOTE — Telephone Encounter (Signed)
Refer to physical therapy 

## 2015-11-14 ENCOUNTER — Ambulatory Visit: Payer: Medicaid Other | Admitting: Rehabilitation

## 2015-11-30 ENCOUNTER — Other Ambulatory Visit: Payer: Self-pay | Admitting: Physician Assistant

## 2015-11-30 ENCOUNTER — Other Ambulatory Visit: Payer: Self-pay | Admitting: *Deleted

## 2015-11-30 ENCOUNTER — Telehealth: Payer: Self-pay | Admitting: Physician Assistant

## 2015-11-30 DIAGNOSIS — E538 Deficiency of other specified B group vitamins: Secondary | ICD-10-CM

## 2015-11-30 DIAGNOSIS — Z79899 Other long term (current) drug therapy: Secondary | ICD-10-CM

## 2015-11-30 DIAGNOSIS — E7800 Pure hypercholesterolemia, unspecified: Secondary | ICD-10-CM

## 2015-11-30 DIAGNOSIS — R5383 Other fatigue: Secondary | ICD-10-CM

## 2015-11-30 DIAGNOSIS — E039 Hypothyroidism, unspecified: Secondary | ICD-10-CM

## 2015-11-30 DIAGNOSIS — D62 Acute posthemorrhagic anemia: Secondary | ICD-10-CM

## 2015-11-30 NOTE — Telephone Encounter (Signed)
Can we see if there is a lipidologist that we see patient? She desires her breakdown of LdL a and b. It does not come in regular labs.

## 2015-11-30 NOTE — Telephone Encounter (Signed)
Patient called and request to have blood work she adv was told need to have thyroid recheck'd in April? Pt stated will go this week for labs- Thanks

## 2015-11-30 NOTE — Telephone Encounter (Signed)
Pt notified to get labs drawn.  She would like to see a lipid specialist as long as they take medicaid.

## 2015-11-30 NOTE — Telephone Encounter (Signed)
Only way to test LDL A and B is to test polyacrylamide gradient gel elctrophoresis. Can we call lab to see if can order?

## 2015-11-30 NOTE — Progress Notes (Signed)
I could not get labs to print. Can you try?  I order labs.  To differentiate LDL you need a polyacrylamide gradient gel electrophoresis testing. Our lab does not do that testing. I am not sure how to get. Could try a lipid specialist and see if they have availabilty.  Her LDL was a little elevated and if concerned we can do a statin.  I did order Lipoprotien cardiac risk factor test. We can see what results are. Niacin and fish oil can improve this.   All labs reodered I suggest appt to go over labs 1 day after having drawn.

## 2015-12-01 LAB — CBC WITH DIFFERENTIAL/PLATELET
BASOS PCT: 1 %
Basophils Absolute: 45 cells/uL (ref 0–200)
EOS ABS: 45 {cells}/uL (ref 15–500)
EOS PCT: 1 %
HCT: 40.7 % (ref 35.0–45.0)
Hemoglobin: 13.9 g/dL (ref 11.7–15.5)
LYMPHS PCT: 33 %
Lymphs Abs: 1485 cells/uL (ref 850–3900)
MCH: 30.2 pg (ref 27.0–33.0)
MCHC: 34.2 g/dL (ref 32.0–36.0)
MCV: 88.3 fL (ref 80.0–100.0)
MONOS PCT: 9 %
MPV: 10 fL (ref 7.5–12.5)
Monocytes Absolute: 405 cells/uL (ref 200–950)
Neutro Abs: 2520 cells/uL (ref 1500–7800)
Neutrophils Relative %: 56 %
PLATELETS: 281 10*3/uL (ref 140–400)
RBC: 4.61 MIL/uL (ref 3.80–5.10)
RDW: 13.5 % (ref 11.0–15.0)
WBC: 4.5 10*3/uL (ref 3.8–10.8)

## 2015-12-01 LAB — T4, FREE: FREE T4: 1.2 ng/dL (ref 0.8–1.8)

## 2015-12-01 LAB — T3, FREE: T3, Free: 2.7 pg/mL (ref 2.3–4.2)

## 2015-12-01 LAB — TSH: TSH: 3.58 mIU/L

## 2015-12-01 LAB — VITAMIN B12: VITAMIN B 12: 252 pg/mL (ref 200–1100)

## 2015-12-02 LAB — LIPID PANEL W/DIRECT LDL/HDL RATIO
Cholesterol: 224 mg/dL — ABNORMAL HIGH (ref 125–200)
HDL: 67 mg/dL (ref >=46)
LDL DIRECT: 145 mg/dL — AB (ref <130)
LDL/HDL RATIO (DIRECT LDL): 2.2 ratio
Total Chol/HDL Ratio: 3.3 Ratio (ref <=5.0)
Triglycerides: 117 mg/dL (ref <150)

## 2015-12-02 LAB — VITAMIN D 25 HYDROXY (VIT D DEFICIENCY, FRACTURES): Vit D, 25-Hydroxy: 27 ng/mL — ABNORMAL LOW (ref 30–100)

## 2015-12-05 ENCOUNTER — Ambulatory Visit: Payer: Medicaid Other | Admitting: Physician Assistant

## 2015-12-06 ENCOUNTER — Ambulatory Visit: Payer: Medicaid Other

## 2015-12-06 LAB — LIPOPROTEIN A (LPA)

## 2016-01-05 ENCOUNTER — Encounter: Payer: Self-pay | Admitting: Family Medicine

## 2016-01-05 ENCOUNTER — Ambulatory Visit (INDEPENDENT_AMBULATORY_CARE_PROVIDER_SITE_OTHER): Payer: Medicaid Other | Admitting: Family Medicine

## 2016-01-05 VITALS — BP 114/75 | HR 86 | Temp 97.9°F | Wt 146.0 lb

## 2016-01-05 DIAGNOSIS — R002 Palpitations: Secondary | ICD-10-CM

## 2016-01-05 DIAGNOSIS — J209 Acute bronchitis, unspecified: Secondary | ICD-10-CM

## 2016-01-05 MED ORDER — PREDNISONE 10 MG PO TABS: 30.0000 mg | ORAL_TABLET | Freq: Every day | ORAL | Status: DC

## 2016-01-05 MED ORDER — LEVALBUTEROL TARTRATE 45 MCG/ACT IN AERO: 1.0000 | INHALATION_SPRAY | RESPIRATORY_TRACT | Status: DC | PRN

## 2016-01-05 MED ORDER — AMOXICILLIN 500 MG PO CAPS: 500.0000 mg | ORAL_CAPSULE | Freq: Three times a day (TID) | ORAL | Status: DC

## 2016-01-05 NOTE — Patient Instructions (Signed)
Thank you for coming in today. Take prednisone and amoxicillin.  Use Xoponex inhaler as needed.  Return if not better.   Acute Bronchitis Bronchitis is inflammation of the airways that extend from the windpipe into the lungs (bronchi). The inflammation often causes mucus to develop. This leads to a cough, which is the most common symptom of bronchitis.  In acute bronchitis, the condition usually develops suddenly and goes away over time, usually in a couple weeks. Smoking, allergies, and asthma can make bronchitis worse. Repeated episodes of bronchitis may cause further lung problems.  CAUSES Acute bronchitis is most often caused by the same virus that causes a cold. The virus can spread from person to person (contagious) through coughing, sneezing, and touching contaminated objects. SIGNS AND SYMPTOMS   Cough.   Fever.   Coughing up mucus.   Body aches.   Chest congestion.   Chills.   Shortness of breath.   Sore throat.  DIAGNOSIS  Acute bronchitis is usually diagnosed through a physical exam. Your health care provider will also ask you questions about your medical history. Tests, such as chest X-rays, are sometimes done to rule out other conditions.  TREATMENT  Acute bronchitis usually goes away in a couple weeks. Oftentimes, no medical treatment is necessary. Medicines are sometimes given for relief of fever or cough. Antibiotic medicines are usually not needed but may be prescribed in certain situations. In some cases, an inhaler may be recommended to help reduce shortness of breath and control the cough. A cool mist vaporizer may also be used to help thin bronchial secretions and make it easier to clear the chest.  HOME CARE INSTRUCTIONS  Get plenty of rest.   Drink enough fluids to keep your urine clear or pale yellow (unless you have a medical condition that requires fluid restriction). Increasing fluids may help thin your respiratory secretions (sputum) and reduce  chest congestion, and it will prevent dehydration.   Take medicines only as directed by your health care provider.  If you were prescribed an antibiotic medicine, finish it all even if you start to feel better.  Avoid smoking and secondhand smoke. Exposure to cigarette smoke or irritating chemicals will make bronchitis worse. If you are a smoker, consider using nicotine gum or skin patches to help control withdrawal symptoms. Quitting smoking will help your lungs heal faster.   Reduce the chances of another bout of acute bronchitis by washing your hands frequently, avoiding people with cold symptoms, and trying not to touch your hands to your mouth, nose, or eyes.   Keep all follow-up visits as directed by your health care provider.  SEEK MEDICAL CARE IF: Your symptoms do not improve after 1 week of treatment.  SEEK IMMEDIATE MEDICAL CARE IF:  You develop an increased fever or chills.   You have chest pain.   You have severe shortness of breath.  You have bloody sputum.   You develop dehydration.  You faint or repeatedly feel like you are going to pass out.  You develop repeated vomiting.  You develop a severe headache. MAKE SURE YOU:   Understand these instructions.  Will watch your condition.  Will get help right away if you are not doing well or get worse.   This information is not intended to replace advice given to you by your health care provider. Make sure you discuss any questions you have with your health care provider.   Document Released: 09/06/2004 Document Revised: 08/20/2014 Document Reviewed: 01/20/2013 Elsevier Interactive Patient  Education 2016 [employer].

## 2016-01-05 NOTE — Progress Notes (Signed)
Jamie [name] is a 65 y.o. female who presents to [facility] today for cough congestion and shortness of breath. Symptoms present for the last several days. She notes chest tightness and wheezing. Symptoms are consistent with previous episodes of bronchitis. She notes she is intolerant to regular albuterol as it causes palpitations and tachycardia. She has never used levoalbuterol.  Additionally she has been summoned for jury duty and does not think that she will be able to serve.   Past Medical History  Diagnosis Date  . Anxiety   . Hyperlipidemia   . Diverticulosis   . IBS (irritable bowel syndrome)   . Depression   . Diverticulitis   . Headache   . Lung mass   . Syncope 03/08/2015   Past Surgical History  Procedure Laterality Date  . Abdominal hysterectomy    . Tubal ligation    . Hemorrhoid surgery    . Tumor removal      Left thigh    Social History  Substance Use Topics  . Smoking status: Former Smoker -- 0.80 packs/day for 20 years    Types: Cigarettes    Quit date: 08/13/1985  . Smokeless tobacco: Never Used  . Alcohol Use: Yes     Comment: rare   family history includes Alcohol abuse in her sister; Arthritis in her sister; Cancer in her mother and sister; Diabetes in her mother; Fibromyalgia in her sister; Heart disease in her brother and father; Irritable bowel syndrome in her sister.  ROS as above:  Medications: Current Outpatient Prescriptions  Medication Sig Dispense Refill  . ALPRAZolam (XANAX) 0.5 MG tablet Take 1 tablet (0.5 mg total) by mouth at bedtime as needed for anxiety. 30 tablet 5  . amoxicillin (AMOXIL) 500 MG capsule Take 1 capsule (500 mg total) by mouth 3 (three) times daily. 21 capsule 0  . cyclobenzaprine (FLEXERIL) 10 MG tablet Take 1 tablet (10 mg total) by mouth 3 (three) times daily as needed for muscle spasms. 20  tablet 0  . cycloSPORINE (RESTASIS) 0.05 % ophthalmic emulsion Place 1 drop into both eyes 2 (two) times daily. 0.4 mL 2  . esomeprazole (NEXIUM) 40 MG capsule Take 1 capsule (40 mg total) by mouth daily as needed (for acid reflux). 30 capsule 3  . levalbuterol (XOPENEX HFA) 45 MCG/ACT inhaler Inhale 1-2 puffs into the lungs every 4 (four) hours as needed for wheezing. Patient cannot tolerate albuterol due to tachycardia 1 Inhaler 12  . Linaclotide (LINZESS) 145 MCG CAPS capsule Take 1 capsule (145 mcg total) by mouth daily. 30 capsule 5  . pravastatin (PRAVACHOL) 40 MG tablet Take 1 tablet (40 mg total) by mouth daily. 30 tablet 6  . saccharomyces boulardii (FLORASTOR) 250 MG capsule Take 1 capsule (250 mg total) by mouth 2 (two) times daily. 120 capsule 0   No current facility-administered medications for this visit.   Allergies  Allergen Reactions  . Ciprofloxacin     Facial numbness  . Peanut Butter Flavor Nausea And Vomiting    N/V  . Shellfish Allergy Swelling     Exam:  BP 114/75 mmHg  Pulse 86  Temp(Src) 97.9 F (36.6 C) (Oral)  Wt 146 lb (66.225 kg)  SpO2 98% Gen: Well NAD HEENT: EOMI,  MMM clear Nasal discharge Lungs: Normal work of breathing. CTABL Heart: RRR no MRG Abd: NABS, Soft. Nondistended, Nontender Exts: Brisk capillary refill, warm and well perfused.   12-lead  EKG: Normal sinus rhythm at 86 bpm. No ST segment elevation or depression. Normal EKG.  No results found for this or any previous visit (from the past 24 hour(s)). No results found.    Assessment and Plan: 65 y.o. female with bronchitis. She was prednisone amoxicillin and levo-albuterol. Follow-up with PCP.  Letter written to excuse from jury duty.  Discussed warning signs or symptoms. Please see discharge instructions. Patient expresses understanding.

## 2016-01-06 ENCOUNTER — Ambulatory Visit: Payer: Medicaid Other | Admitting: Physician Assistant

## 2016-01-11 ENCOUNTER — Ambulatory Visit (INDEPENDENT_AMBULATORY_CARE_PROVIDER_SITE_OTHER): Payer: Medicaid Other

## 2016-01-11 ENCOUNTER — Encounter: Payer: Self-pay | Admitting: Physician Assistant

## 2016-01-11 ENCOUNTER — Ambulatory Visit (INDEPENDENT_AMBULATORY_CARE_PROVIDER_SITE_OTHER): Payer: Medicaid Other | Admitting: Physician Assistant

## 2016-01-11 VITALS — BP 123/56 | HR 84 | Ht 65.0 in | Wt 144.0 lb

## 2016-01-11 DIAGNOSIS — N898 Other specified noninflammatory disorders of vagina: Secondary | ICD-10-CM

## 2016-01-11 DIAGNOSIS — Z91018 Allergy to other foods: Secondary | ICD-10-CM

## 2016-01-11 DIAGNOSIS — J208 Acute bronchitis due to other specified organisms: Secondary | ICD-10-CM | POA: Diagnosis not present

## 2016-01-11 DIAGNOSIS — B353 Tinea pedis: Secondary | ICD-10-CM | POA: Insufficient documentation

## 2016-01-11 DIAGNOSIS — T7800XA Anaphylactic reaction due to unspecified food, initial encounter: Secondary | ICD-10-CM

## 2016-01-11 DIAGNOSIS — L819 Disorder of pigmentation, unspecified: Secondary | ICD-10-CM

## 2016-01-11 HISTORY — DX: Allergy to other foods: Z91.018

## 2016-01-11 HISTORY — DX: Anaphylactic reaction due to unspecified food, initial encounter: T78.00XA

## 2016-01-11 HISTORY — DX: Disorder of pigmentation, unspecified: L81.9

## 2016-01-11 HISTORY — DX: Other specified noninflammatory disorders of vagina: N89.8

## 2016-01-11 MED ORDER — DOXYCYCLINE HYCLATE 100 MG PO CAPS: 100.0000 mg | ORAL_CAPSULE | Freq: Two times a day (BID) | ORAL | Status: DC

## 2016-01-11 MED ORDER — DOXYCYCLINE HYCLATE 100 MG PO TABS: 100.0000 mg | ORAL_TABLET | Freq: Two times a day (BID) | ORAL | Status: DC

## 2016-01-11 MED ORDER — KETOCONAZOLE 2 % EX CREA: 1.0000 "application " | TOPICAL_CREAM | Freq: Every day | CUTANEOUS | Status: DC

## 2016-01-11 MED ORDER — ESTRADIOL 0.1 MG/GM VA CREA: 1.0000 | TOPICAL_CREAM | VAGINAL | Status: DC

## 2016-01-11 MED ORDER — METHYLPREDNISOLONE SODIUM SUCC 125 MG IJ SOLR
125.0000 mg | Freq: Once | INTRAMUSCULAR | Status: AC
  Administered 2016-01-11: 125 mg via INTRAMUSCULAR

## 2016-01-11 MED ORDER — EPINEPHRINE 0.15 MG/0.15ML IJ SOAJ: 0.1500 mg | INTRAMUSCULAR | Status: DC | PRN

## 2016-01-11 NOTE — Progress Notes (Signed)
   Subjective:    Patient ID: Jamie [name], female    DOB: Aug 30, 1950, 65 y.o.   MRN: [id number]  HPI  Patient is a 65 year old female who presents to the clinic to follow-up on acute bronchitis. She was last seen on the clinic on 01/05/2016 by Dr. Georgina Snell. She was given amoxicillin, prednisone and Xopenex. She never used the Xopenex because it causes her to feel jittery in her palpitations to be worse in the past. She feels like her body is telling her that something is wrong. She has not improved. She at times feels very dizzy. She is concerned because what started all this was her working with her ex-husband and houses that have molded in a lot of water. Some of the symptoms started with her sneezing. She is not taking anything for allergies. Pt continues to cough with green mucus production. No fever, chills, body aches. Coughing is making her tired.   She is requesting a allergy referral today. 4 years ago she noticed after eating shellfish that she broke out in hives on her legs and arms. The next time she ate shellfish she was having problems breathing. She took Benadryl and mated to the hospital. 2 years ago she had similar reaction to peanut butter. She feels like slowly she is developing food allergies. She would like to see an allergist. She has never been given an EpiPen.   She also complains of vaginal dryness and irritation. She has never used anything over-the-counter to help with dryness. She feels like some vaginal estrogen cream could help her. She is not sexually active.   Her right foot is very itchy and scaly. She would like something to help with this.   Review of Systems  All other systems reviewed and are negative.      Objective:   Physical Exam  Constitutional: She is oriented to person, place, and time. She appears well-developed and well-nourished.  HENT:  Head: Normocephalic and atraumatic.  Right Ear: External ear normal.  Left Ear: External ear normal.  Nose: Nose  normal.  Mouth/Throat: Oropharynx is clear and moist. No oropharyngeal exudate.  TM's clear.   Eyes: Conjunctivae are normal. Right eye exhibits no discharge. Left eye exhibits no discharge.  Neck: Normal range of motion. Neck supple.  Cardiovascular: Normal rate, regular rhythm and normal heart sounds.   Pulmonary/Chest: Effort normal.  No wheezing. Scattered rhonchi cleared with cough.   Genitourinary:  Hypopigmentation without scales in genital region and into groin.   Labia minora and introitus is erythematous without discharge.   Lymphadenopathy:    She has no cervical adenopathy.  Neurological: She is alert and oriented to person, place, and time.  Skin: Skin is dry.  Scaly right foot with erythema.   Psychiatric: She has a normal mood and affect. Her behavior is normal.          Assessment & Plan:  Acute bronchitis- pt does not tolerate breathing treatments. Solumedrol 125mg  IM given. Start doxycyline 100mg  bid. Pt request capsules. Follow up as needed. CXR ordered. Seems like could be some allergic component. Encouraged to start zyrtec or claritin daily.   Multiple food allergies/anaphlaxis- referral to allergist. adrenoclick sent to pharmacy for emergency use only.   Tinea pedis, right foot- HO given ketoconazole given bid for 4-6 weeks.   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 Pt request dermatology.   Vaginal dryness-likely due to post-menopausal state.  estrace 3 times a week. Follow up in 2 months.

## 2016-01-11 NOTE — Patient Instructions (Signed)
Will make allergy referral for multiple food allergy.

## 2016-01-17 ENCOUNTER — Telehealth: Payer: Self-pay | Admitting: *Deleted

## 2016-01-17 NOTE — Telephone Encounter (Signed)
PA initiated on estrace vaginal cream through [place] [employer]medicaid) Pending review. PA Case # [id number], call ref # [id number]

## 2016-01-24 NOTE — Telephone Encounter (Signed)
Estrace has been approved through insurance. Pt notified and pharm notified via vm

## 2016-02-12 ENCOUNTER — Other Ambulatory Visit: Payer: Self-pay | Admitting: Physician Assistant

## 2016-03-31 ENCOUNTER — Other Ambulatory Visit: Payer: Self-pay | Admitting: Physician Assistant

## 2016-04-24 ENCOUNTER — Other Ambulatory Visit: Payer: Self-pay | Admitting: Physician Assistant

## 2016-04-24 DIAGNOSIS — Z1231 Encounter for screening mammogram for malignant neoplasm of breast: Secondary | ICD-10-CM

## 2016-05-10 ENCOUNTER — Telehealth: Payer: Self-pay | Admitting: *Deleted

## 2016-05-10 DIAGNOSIS — E785 Hyperlipidemia, unspecified: Secondary | ICD-10-CM

## 2016-05-10 DIAGNOSIS — E538 Deficiency of other specified B group vitamins: Secondary | ICD-10-CM

## 2016-05-10 DIAGNOSIS — E039 Hypothyroidism, unspecified: Secondary | ICD-10-CM

## 2016-05-10 DIAGNOSIS — Z79899 Other long term (current) drug therapy: Secondary | ICD-10-CM

## 2016-05-10 DIAGNOSIS — E559 Vitamin D deficiency, unspecified: Secondary | ICD-10-CM

## 2016-05-10 DIAGNOSIS — D510 Vitamin B12 deficiency anemia due to intrinsic factor deficiency: Secondary | ICD-10-CM

## 2016-05-10 NOTE — Telephone Encounter (Signed)
Labs ordered for CPE

## 2016-05-11 ENCOUNTER — Ambulatory Visit: Payer: Medicaid Other

## 2016-05-11 ENCOUNTER — Ambulatory Visit (INDEPENDENT_AMBULATORY_CARE_PROVIDER_SITE_OTHER): Payer: Medicaid Other

## 2016-05-11 ENCOUNTER — Encounter: Payer: Self-pay | Admitting: Physician Assistant

## 2016-05-11 ENCOUNTER — Ambulatory Visit (INDEPENDENT_AMBULATORY_CARE_PROVIDER_SITE_OTHER): Payer: Medicaid Other | Admitting: Physician Assistant

## 2016-05-11 VITALS — BP 109/70 | HR 78 | Wt 144.0 lb

## 2016-05-11 DIAGNOSIS — R05 Cough: Secondary | ICD-10-CM

## 2016-05-11 DIAGNOSIS — R059 Cough, unspecified: Secondary | ICD-10-CM

## 2016-05-11 DIAGNOSIS — E7800 Pure hypercholesterolemia, unspecified: Secondary | ICD-10-CM | POA: Diagnosis not present

## 2016-05-11 DIAGNOSIS — Z87891 Personal history of nicotine dependence: Secondary | ICD-10-CM | POA: Diagnosis not present

## 2016-05-11 DIAGNOSIS — Z0000 Encounter for general adult medical examination without abnormal findings: Secondary | ICD-10-CM | POA: Diagnosis not present

## 2016-05-11 DIAGNOSIS — Z1231 Encounter for screening mammogram for malignant neoplasm of breast: Secondary | ICD-10-CM | POA: Diagnosis not present

## 2016-05-11 LAB — CBC WITH DIFFERENTIAL/PLATELET
Basophils Absolute: 45 cells/uL (ref 0–200)
Basophils Relative: 1 %
EOS PCT: 1 %
Eosinophils Absolute: 45 cells/uL (ref 15–500)
HCT: 37.2 % (ref 35.0–45.0)
Hemoglobin: 12.7 g/dL (ref 11.7–15.5)
LYMPHS PCT: 32 %
Lymphs Abs: 1440 cells/uL (ref 850–3900)
MCH: 29.9 pg (ref 27.0–33.0)
MCHC: 34.1 g/dL (ref 32.0–36.0)
MCV: 87.5 fL (ref 80.0–100.0)
MONOS PCT: 11 %
MPV: 9.8 fL (ref 7.5–12.5)
Monocytes Absolute: 495 cells/uL (ref 200–950)
NEUTROS PCT: 55 %
Neutro Abs: 2475 cells/uL (ref 1500–7800)
Platelets: 274 10*3/uL (ref 140–400)
RBC: 4.25 MIL/uL (ref 3.80–5.10)
RDW: 12.6 % (ref 11.0–15.0)
WBC: 4.5 10*3/uL (ref 3.8–10.8)

## 2016-05-11 LAB — COMPLETE METABOLIC PANEL WITH GFR
ALBUMIN: 4.3 g/dL (ref 3.6–5.1)
ALK PHOS: 47 U/L (ref 33–130)
ALT: 15 U/L (ref 6–29)
AST: 22 U/L (ref 10–35)
BILIRUBIN TOTAL: 0.7 mg/dL (ref 0.2–1.2)
BUN: 19 mg/dL (ref 7–25)
CALCIUM: 9.2 mg/dL (ref 8.6–10.4)
CO2: 27 mmol/L (ref 20–31)
CREATININE: 0.79 mg/dL (ref 0.50–0.99)
Chloride: 104 mmol/L (ref 98–110)
GFR, Est African American: >89 mL/min (ref >=60)
GFR, Est Non African American: 80 mL/min (ref >=60)
Glucose, Bld: 93 mg/dL (ref 65–99)
POTASSIUM: 4.5 mmol/L (ref 3.5–5.3)
Sodium: 140 mmol/L (ref 135–146)
TOTAL PROTEIN: 6.7 g/dL (ref 6.1–8.1)

## 2016-05-11 LAB — TSH: TSH: 2.78 m[IU]/L

## 2016-05-11 LAB — LIPID PANEL
Cholesterol: 223 mg/dL — ABNORMAL HIGH (ref 125–200)
HDL: 54 mg/dL (ref >=46)
LDL CALC: 146 mg/dL — AB (ref <130)
Total CHOL/HDL Ratio: 4.1 Ratio (ref <=5.0)
Triglycerides: 113 mg/dL (ref <150)
VLDL: 23 mg/dL (ref <30)

## 2016-05-11 LAB — T4, FREE: FREE T4: 1 ng/dL (ref 0.8–1.8)

## 2016-05-11 LAB — T3, FREE: T3, Free: 3 pg/mL (ref 2.3–4.2)

## 2016-05-11 LAB — VITAMIN B12: VITAMIN B 12: 208 pg/mL (ref 200–1100)

## 2016-05-11 MED ORDER — ALPRAZOLAM 0.5 MG PO TABS: 0.5000 mg | ORAL_TABLET | Freq: Every day | ORAL | 5 refills | Status: DC

## 2016-05-11 NOTE — Patient Instructions (Signed)

## 2016-05-11 NOTE — Progress Notes (Signed)
Subjective:    Patient ID: Jamie [name], female    DOB: [birth year], 65 y.o.   MRN: [id number]  HPI   Review of Systems     Objective:   Physical Exam        Assessment & Plan:   Subjective:     Jamie [name] is a 65 y.o. female and is here for a comprehensive physical exam. The patient reports:   She would like a chest xray due to her smoking hx that started at 29 and stopped when 33. She has a cough that is dry. Hx of abnormal CT with lung nodule that appeared to be shrinking at last recheck over a year ago. Pt is very concerned.   She also request carotid dopplers for CV risk assessment.   Request prescription of vitamin D and b12.   Social History   Social History  . Marital status: Divorced    Spouse name: N/A  . Number of children: 3  . Years of education: N/A   Occupational History  . Student    Social History Main Topics  . Smoking status: Former Smoker    Packs/day: 0.80    Years: 20.00    Types: Cigarettes    Quit date: 08/13/1985  . Smokeless tobacco: Never Used  . Alcohol use Yes     Comment: rare  . Drug use: No  . Sexual activity: Not on file   Other Topics Concern  . Not on file   Social History Narrative   Lives with and grandchild (she is foster parent for grand child)Completed school full time.   Caffeine use; occasional   Works in Personal assistant, Economist.   In process of divorce.           Health Maintenance  Topic Date Due  . TETANUS/TDAP  08/14/2015  . DEXA SCAN  01/21/2016  . INFLUENZA VACCINE  07/18/2016 (Originally 03/13/2016)  . Hepatitis C Screening  07/18/2016 (Originally 10/08/1950)  . ZOSTAVAX  07/19/2035 (Originally 12/19/2012)  . PAP SMEAR  07/18/2045 (Originally 11/11/2012)  . MAMMOGRAM  01/23/2017  . COLONOSCOPY  07/19/2018  . HIV Screening  Completed    The following portions of the patient's history were reviewed and updated as appropriate: current medications, past family history, past medical  history, past social history, past surgical history and problem list.  Review of Systems Pertinent items noted in HPI and remainder of comprehensive ROS otherwise negative.   Objective:    BP 109/70   Pulse 78   Wt 144 lb (65.3 kg)   BMI 23.96 kg/m  General appearance: alert and cooperative Head: Normocephalic, without obvious abnormality, atraumatic Eyes: conjunctivae/corneas clear. PERRL, EOM's intact. Fundi benign. Ears: normal TM's and external ear canals both ears Nose: Nares normal. Septum midline. Mucosa normal. No drainage or sinus tenderness. Throat: lips, mucosa, and tongue normal; teeth and gums normal Neck: no adenopathy, no carotid bruit, no JVD, supple, symmetrical, trachea midline and thyroid not enlarged, symmetric, no tenderness/mass/nodules Back: symmetric, no curvature. ROM normal. No CVA tenderness. Lungs: clear to auscultation bilaterally Heart: regular rate and rhythm, S1, S2 normal, no murmur, click, rub or gallop Abdomen: soft, non-tender; bowel sounds normal; no masses,  no organomegaly Extremities: extremities normal, atraumatic, no cyanosis or edema Pulses: 2+ and symmetric Skin: Skin color, texture, turgor normal. No rashes or lesions Lymph nodes: Cervical, supraclavicular, and axillary nodes normal. Neurologic: Alert and oriented X 3, normal strength and tone. Normal symmetric reflexes. Normal  coordination and gait    Assessment:    Healthy female exam.     Plan:    Marland KitchenMarland KitchenMalary was seen today for annual exam.  Diagnoses and all orders for this visit:  Routine physical examination -     CT CHEST LUNG CA SCREEN LOW DOSE W/O CM; Future  Former smoker -     CT CHEST LUNG CA SCREEN LOW DOSE W/O CM; Future  Cough -     CT CHEST LUNG CA SCREEN LOW DOSE W/O CM; Future  Pure hypercholesterolemia -     US Carotid Duplex Bilateral; Future  Other orders -     ALPRAZolam (XANAX) 0.5 MG tablet; Take 1 tablet (0.5 mg total) by mouth at bedtime. for  anxiety.   CPE- discussed regular exercise 179minutes a week and vitamin D and calcium.  Screening labs ordered.   See After Visit Summary for Counseling Recommendations

## 2016-05-12 LAB — VITAMIN D 25 HYDROXY (VIT D DEFICIENCY, FRACTURES): VIT D 25 HYDROXY: 29 ng/mL — AB (ref 30–100)

## 2016-05-14 ENCOUNTER — Encounter: Payer: Self-pay | Admitting: Physician Assistant

## 2016-05-14 ENCOUNTER — Ambulatory Visit (HOSPITAL_BASED_OUTPATIENT_CLINIC_OR_DEPARTMENT_OTHER)
Admission: RE | Admit: 2016-05-14 | Discharge: 2016-05-14 | Disposition: A | Discharge status: Home / Self Care | Payer: Medicaid Other | Source: Ambulatory Visit | Attending: Physician Assistant | Admitting: Physician Assistant

## 2016-05-14 DIAGNOSIS — E7800 Pure hypercholesterolemia, unspecified: Secondary | ICD-10-CM | POA: Diagnosis present

## 2016-05-14 DIAGNOSIS — Z87891 Personal history of nicotine dependence: Secondary | ICD-10-CM | POA: Insufficient documentation

## 2016-05-14 DIAGNOSIS — R05 Cough: Secondary | ICD-10-CM | POA: Insufficient documentation

## 2016-05-14 DIAGNOSIS — R059 Cough, unspecified: Secondary | ICD-10-CM | POA: Insufficient documentation

## 2016-05-14 HISTORY — DX: Personal history of nicotine dependence: Z87.891

## 2016-05-15 ENCOUNTER — Telehealth: Payer: Self-pay | Admitting: Physician Assistant

## 2016-05-15 DIAGNOSIS — M8580 Other specified disorders of bone density and structure, unspecified site: Secondary | ICD-10-CM

## 2016-05-15 NOTE — Telephone Encounter (Signed)
? 

## 2016-05-15 NOTE — Telephone Encounter (Signed)
Ok to place orders

## 2016-05-15 NOTE — Telephone Encounter (Signed)
DEXA ordered.  

## 2016-05-15 NOTE — Telephone Encounter (Signed)
Patient called request to get an order put in for Bone Density and imaging has scheduled her for Oct 11 th next Wednesday. Thanks

## 2016-05-23 ENCOUNTER — Ambulatory Visit (INDEPENDENT_AMBULATORY_CARE_PROVIDER_SITE_OTHER): Payer: Medicaid Other

## 2016-05-23 DIAGNOSIS — M8588 Other specified disorders of bone density and structure, other site: Secondary | ICD-10-CM | POA: Diagnosis not present

## 2016-05-23 DIAGNOSIS — M85851 Other specified disorders of bone density and structure, right thigh: Secondary | ICD-10-CM

## 2016-05-23 DIAGNOSIS — M8580 Other specified disorders of bone density and structure, unspecified site: Secondary | ICD-10-CM

## 2016-06-11 ENCOUNTER — Other Ambulatory Visit: Payer: Self-pay | Admitting: Physician Assistant

## 2016-07-03 ENCOUNTER — Emergency Department (HOSPITAL_BASED_OUTPATIENT_CLINIC_OR_DEPARTMENT_OTHER): Payer: Medicaid Other

## 2016-07-03 ENCOUNTER — Encounter (HOSPITAL_BASED_OUTPATIENT_CLINIC_OR_DEPARTMENT_OTHER): Payer: Self-pay | Admitting: *Deleted

## 2016-07-03 ENCOUNTER — Emergency Department (HOSPITAL_BASED_OUTPATIENT_CLINIC_OR_DEPARTMENT_OTHER)
Admission: EM | Admit: 2016-07-03 | Discharge: 2016-07-03 | Disposition: A | Discharge status: Home / Self Care | Payer: Medicaid Other | Attending: Emergency Medicine | Admitting: Emergency Medicine

## 2016-07-03 ENCOUNTER — Ambulatory Visit: Payer: Medicaid Other | Admitting: Osteopathic Medicine

## 2016-07-03 DIAGNOSIS — R05 Cough: Secondary | ICD-10-CM | POA: Diagnosis present

## 2016-07-03 DIAGNOSIS — Z79899 Other long term (current) drug therapy: Secondary | ICD-10-CM | POA: Diagnosis not present

## 2016-07-03 DIAGNOSIS — Z87891 Personal history of nicotine dependence: Secondary | ICD-10-CM | POA: Insufficient documentation

## 2016-07-03 DIAGNOSIS — J189 Pneumonia, unspecified organism: Secondary | ICD-10-CM | POA: Diagnosis not present

## 2016-07-03 LAB — COMPREHENSIVE METABOLIC PANEL
ALBUMIN: 4.3 g/dL (ref 3.5–5.0)
ALK PHOS: 53 U/L (ref 38–126)
ALT: 21 U/L (ref 14–54)
AST: 28 U/L (ref 15–41)
Anion gap: 9 (ref 5–15)
BUN: 10 mg/dL (ref 6–20)
CALCIUM: 8.9 mg/dL (ref 8.9–10.3)
CHLORIDE: 101 mmol/L (ref 101–111)
CO2: 23 mmol/L (ref 22–32)
CREATININE: 0.66 mg/dL (ref 0.44–1.00)
GFR calc non Af Amer: >60 mL/min (ref >60)
GLUCOSE: 93 mg/dL (ref 65–99)
Potassium: 4.1 mmol/L (ref 3.5–5.1)
SODIUM: 133 mmol/L — AB (ref 135–145)
Total Bilirubin: 0.6 mg/dL (ref 0.3–1.2)
Total Protein: 7.3 g/dL (ref 6.5–8.1)

## 2016-07-03 LAB — CBC WITH DIFFERENTIAL/PLATELET
BASOS ABS: 0 10*3/uL (ref 0.0–0.1)
Basophils Relative: 0 %
EOS ABS: 0 10*3/uL (ref 0.0–0.7)
Eosinophils Relative: 0 %
HCT: 38.6 % (ref 36.0–46.0)
HEMOGLOBIN: 13.3 g/dL (ref 12.0–15.0)
LYMPHS ABS: 0.8 10*3/uL (ref 0.7–4.0)
Lymphocytes Relative: 16 %
MCH: 30.2 pg (ref 26.0–34.0)
MCHC: 34.5 g/dL (ref 30.0–36.0)
MCV: 87.5 fL (ref 78.0–100.0)
Monocytes Absolute: 0.8 10*3/uL (ref 0.1–1.0)
Monocytes Relative: 16 %
NEUTROS PCT: 68 %
Neutro Abs: 3.2 10*3/uL (ref 1.7–7.7)
PLATELETS: 265 10*3/uL (ref 150–400)
RBC: 4.41 MIL/uL (ref 3.87–5.11)
RDW: 12.1 % (ref 11.5–15.5)
WBC: 4.8 10*3/uL (ref 4.0–10.5)

## 2016-07-03 LAB — TROPONIN I: Troponin I: <0.03 ng/mL (ref <0.03)

## 2016-07-03 MED ORDER — BENZONATATE 100 MG PO CAPS: 100.0000 mg | ORAL_CAPSULE | Freq: Three times a day (TID) | ORAL | 0 refills | Status: DC | PRN

## 2016-07-03 MED ORDER — SODIUM CHLORIDE 0.9 % IV BOLUS (SEPSIS)
1000.0000 mL | Freq: Once | INTRAVENOUS | Status: AC
  Administered 2016-07-03: 1000 mL via INTRAVENOUS

## 2016-07-03 MED ORDER — KETOROLAC TROMETHAMINE 30 MG/ML IJ SOLN
30.0000 mg | Freq: Once | INTRAMUSCULAR | Status: AC
  Administered 2016-07-03: 30 mg via INTRAVENOUS
  Filled 2016-07-03: qty 1

## 2016-07-03 MED ORDER — BENZONATATE 100 MG PO CAPS
100.0000 mg | ORAL_CAPSULE | Freq: Once | ORAL | Status: AC
  Administered 2016-07-03: 100 mg via ORAL
  Filled 2016-07-03: qty 1

## 2016-07-03 MED ORDER — IBUPROFEN 600 MG PO TABS: 600.0000 mg | ORAL_TABLET | Freq: Four times a day (QID) | ORAL | 0 refills | Status: DC | PRN

## 2016-07-03 MED ORDER — AZITHROMYCIN 250 MG PO TABS: ORAL_TABLET | ORAL | 0 refills | Status: DC

## 2016-07-03 MED ORDER — DEXTROSE 5 % IV SOLN
1.0000 g | Freq: Once | INTRAVENOUS | Status: AC
  Administered 2016-07-03: 1 g via INTRAVENOUS
  Filled 2016-07-03: qty 10

## 2016-07-03 NOTE — ED Triage Notes (Addendum)
Pt reports URI sx, cough with burning chest pain, subjective fever, urinary frequency, and back pain since friday
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
## 2016-07-03 NOTE — ED Notes (Signed)
Patient transported to X-ray 

## 2016-07-12 ENCOUNTER — Telehealth: Payer: Self-pay | Admitting: *Deleted

## 2016-07-12 NOTE — Telephone Encounter (Signed)
Pt left vm stating that she went to the ED & was dx with pneumonia.  She was given IV rocephin, tessalon pearls,  & z-pack. She stated that the z-pack was too "weak" & she is requesting Levaquin & the "red cough syrup".  Please advise.

## 2016-07-13 ENCOUNTER — Other Ambulatory Visit: Payer: Self-pay | Admitting: *Deleted

## 2016-07-13 MED ORDER — LEVOFLOXACIN 750 MG PO TABS: 750.0000 mg | ORAL_TABLET | Freq: Every day | ORAL | 0 refills | Status: DC

## 2016-07-13 NOTE — Telephone Encounter (Signed)
Ok to send levaquin 750 one tablet for 5 days. #5 NRF  Red cough syrup is a controlled substance and now need an office visit to get. Consider delsym OTC.

## 2016-07-13 NOTE — Telephone Encounter (Signed)
LMOM notifying pt.

## 2016-08-18 ENCOUNTER — Other Ambulatory Visit: Payer: Self-pay | Admitting: Physician Assistant

## 2016-09-26 ENCOUNTER — Telehealth: Payer: Self-pay | Admitting: Physician Assistant

## 2016-09-26 DIAGNOSIS — J3489 Other specified disorders of nose and nasal sinuses: Secondary | ICD-10-CM

## 2016-09-26 DIAGNOSIS — J329 Chronic sinusitis, unspecified: Secondary | ICD-10-CM

## 2016-09-26 DIAGNOSIS — J301 Allergic rhinitis due to pollen: Secondary | ICD-10-CM

## 2016-09-26 NOTE — Telephone Encounter (Signed)
Pt has ongoing recurrent sinusitis, cough, nasal congestion, sinus pressure, allergic rhinitis. Would like allergy referral.

## 2016-11-02 ENCOUNTER — Other Ambulatory Visit: Payer: Self-pay

## 2016-11-02 ENCOUNTER — Telehealth: Payer: Self-pay

## 2016-11-02 DIAGNOSIS — R059 Cough, unspecified: Secondary | ICD-10-CM

## 2016-11-02 DIAGNOSIS — R05 Cough: Secondary | ICD-10-CM

## 2016-11-02 NOTE — Telephone Encounter (Signed)
Pt is now requesting an order for a CT of her lung.  She said it is past due.

## 2016-11-02 NOTE — Telephone Encounter (Signed)
Error

## 2016-11-02 NOTE — Telephone Encounter (Signed)
I explained that to her when she called, but she said you would.  I will call her and give her those options.

## 2016-11-02 NOTE — Telephone Encounter (Signed)
No we can't just send antibiotics over for a new condition. She can try tylenol cold and sinus severe to help get everything up. Or come in for CXR to make sure not pneumonia. I am ok with that.

## 2016-11-02 NOTE — Telephone Encounter (Signed)
Mikes for CT of chest due to hx of smoking and SOB and Cough.

## 2016-11-02 NOTE — Telephone Encounter (Signed)
Pt called and said that she is sick again with a cough and feels that she may be getting pneumonia like she did several months ago.  She was asking if you can send a script for levofloxacin.  Please advise.

## 2016-11-05 ENCOUNTER — Ambulatory Visit (INDEPENDENT_AMBULATORY_CARE_PROVIDER_SITE_OTHER): Payer: Medicaid Other | Admitting: Physician Assistant

## 2016-11-05 ENCOUNTER — Encounter: Payer: Self-pay | Admitting: Physician Assistant

## 2016-11-05 ENCOUNTER — Ambulatory Visit (INDEPENDENT_AMBULATORY_CARE_PROVIDER_SITE_OTHER): Payer: Medicaid Other

## 2016-11-05 ENCOUNTER — Ambulatory Visit (INDEPENDENT_AMBULATORY_CARE_PROVIDER_SITE_OTHER): Payer: Medicaid Other | Admitting: Family Medicine

## 2016-11-05 ENCOUNTER — Ambulatory Visit: Payer: Medicaid Other | Admitting: Physician Assistant

## 2016-11-05 VITALS — BP 124/69 | HR 87

## 2016-11-05 VITALS — BP 124/69 | HR 87 | Ht 65.0 in | Wt 149.0 lb

## 2016-11-05 DIAGNOSIS — Z87891 Personal history of nicotine dependence: Secondary | ICD-10-CM

## 2016-11-05 DIAGNOSIS — M25561 Pain in right knee: Secondary | ICD-10-CM | POA: Diagnosis not present

## 2016-11-05 DIAGNOSIS — R0789 Other chest pain: Secondary | ICD-10-CM

## 2016-11-05 DIAGNOSIS — Z77098 Contact with and (suspected) exposure to other hazardous, chiefly nonmedicinal, chemicals: Secondary | ICD-10-CM | POA: Diagnosis not present

## 2016-11-05 DIAGNOSIS — S39012A Strain of muscle, fascia and tendon of lower back, initial encounter: Secondary | ICD-10-CM

## 2016-11-05 DIAGNOSIS — R918 Other nonspecific abnormal finding of lung field: Secondary | ICD-10-CM

## 2016-11-05 DIAGNOSIS — J209 Acute bronchitis, unspecified: Secondary | ICD-10-CM

## 2016-11-05 HISTORY — DX: Pain in right knee: M25.561

## 2016-11-05 MED ORDER — CYCLOBENZAPRINE HCL 10 MG PO TABS: 10.0000 mg | ORAL_TABLET | Freq: Three times a day (TID) | ORAL | 5 refills | Status: DC | PRN

## 2016-11-05 MED ORDER — ALPRAZOLAM 0.5 MG PO TABS: 0.5000 mg | ORAL_TABLET | Freq: Every day | ORAL | 5 refills | Status: DC | PRN

## 2016-11-05 MED ORDER — PREDNISONE 50 MG PO TABS: ORAL_TABLET | ORAL | 0 refills | Status: DC

## 2016-11-05 MED ORDER — DICLOFENAC SODIUM 1 % TD GEL: 4.0000 g | Freq: Four times a day (QID) | TRANSDERMAL | 11 refills | Status: DC

## 2016-11-05 MED ORDER — LEVOFLOXACIN 750 MG PO TABS: 750.0000 mg | ORAL_TABLET | Freq: Every day | ORAL | 0 refills | Status: DC
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
## 2016-11-05 NOTE — Patient Instructions (Signed)
Thank you for coming in today. Use voltaren gel for pain on both the knee and back.  Get xray today.  Recheck in 2 weeks.  Come back or go to the emergency room if you notice new weakness new numbness problems walking or bowel or bladder problems.

## 2016-11-05 NOTE — Progress Notes (Signed)
Jamie [name] is a 66 y.o. female who presents to [facility] today for right knee pain. Patient stumbled down a ladder about 2 weeks ago. She notes diffuse right anterior knee pain and mild right low back pain. She denies any radiating pain weakness or numbness. She's tried some over-the-counter medication and ice which have helped a little. She has the pain is limiting her ability to squat. She denies any weakness or numbness   Past Medical History:  Diagnosis Date  . Anxiety   . Depression   . Diverticulitis   . Diverticulosis   . Headache   . Hyperlipidemia   . IBS (irritable bowel syndrome)   . Lung mass   . Syncope 03/08/2015   Past Surgical History:  Procedure Laterality Date  . ABDOMINAL HYSTERECTOMY    . HEMORRHOID SURGERY    . TUBAL LIGATION    . TUMOR REMOVAL     Left thigh    Social History  Substance Use Topics  . Smoking status: Former Smoker    Packs/day: 0.80    Years: 20.00    Types: Cigarettes    Quit date: 08/13/1985  . Smokeless tobacco: Never Used  . Alcohol use Yes     Comment: rare     ROS:  As above   Medications: Current Outpatient Prescriptions  Medication Sig Dispense Refill  . ALPRAZolam (XANAX) 0.5 MG tablet Take 1 tablet (0.5 mg total) by mouth daily as needed. for anxiety 30 tablet 5  . cyclobenzaprine (FLEXERIL) 10 MG tablet Take 1 tablet (10 mg total) by mouth 3 (three) times daily as needed for muscle spasms. 20 tablet 5  . cycloSPORINE (RESTASIS) 0.05 % ophthalmic emulsion Place 1 drop into both eyes 2 (two) times daily. 0.4 mL 2  . diclofenac sodium (VOLTAREN) 1 % GEL Apply 4 g topically 4 (four) times daily. To affected joint. 100 g 11  . EPINEPHrine 0.15 MG/0.15ML IJ injection Inject 0.15 mLs (0.15 mg total) into the muscle as needed for anaphylaxis. 2 Device 0  . estradiol (ESTRACE) 0.1 MG/GM vaginal cream Place 1 Applicatorful vaginally 3 (three) times a week. 42.5 g 5  . ibuprofen  (ADVIL,MOTRIN) 600 MG tablet Take 1 tablet (600 mg total) by mouth every 6 (six) hours as needed. 30 tablet 0  . ketoconazole (NIZORAL) 2 % cream Apply 1 application topically daily. 60 g 0  . levofloxacin (LEVAQUIN) 750 MG tablet Take 1 tablet (750 mg total) by mouth daily. 7 tablet 0  . Linaclotide (LINZESS) 145 MCG CAPS capsule Take 1 capsule (145 mcg total) by mouth daily. 30 capsule 5  . NEXIUM 40 MG capsule TAKE 1 CAPSULE(40 MG) BY MOUTH DAILY AS NEEDED 30 capsule 5  . pravastatin (PRAVACHOL) 40 MG tablet Take 1 tablet (40 mg total) by mouth daily. 30 tablet 6  . predniSONE (DELTASONE) 50 MG tablet Take one tablet for 5 days. 5 tablet 0  . saccharomyces boulardii (FLORASTOR) 250 MG capsule Take 1 capsule (250 mg total) by mouth 2 (two) times daily. 120 capsule 0   No current facility-administered medications for this visit.    Allergies  Allergen Reactions  . Ciprofloxacin     Facial numbness  . Peanut Butter Flavor Nausea And Vomiting    N/V  . Shellfish Allergy Swelling     Exam:  BP 124/69   Pulse 87  General: Well Developed, well nourished, and in no acute distress.  Neuro/Psych: Alert and oriented x3,  extra-ocular muscles intact, able to move all 4 extremities, sensation grossly intact. Skin: Warm and dry, no rashes noted.  Respiratory: Not using accessory muscles, speaking in full sentences, trachea midline.  Cardiovascular: Pulses palpable, no extremity edema. Abdomen: Does not appear distended. MSK: LS spine: Nontender to spinal midline. Tender palpation right SI joint. Normal back motion. Lower extremity strength is intact throughout. Right knee normal-appearing. Tender to palpation anterior and lateral knee. Normal range of motion with mild crepitations.   stable ligamentous exam Intact strength.    No results found for this or any previous visit (from the past 48 hour(s)). No results found.    Assessment and Plan: 67 y.o. 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 X-ray pending. Treat with diclofenac gel and recheck in about 2 weeks.    Orders Placed This Encounter  Procedures  . DG Knee 1-2 Views Left    Standing Status:   Future    Number of Occurrences:   1    Standing Expiration Date:   01/06/2018    Order Specific Question:   Reason for Exam (SYMPTOM  OR DIAGNOSIS REQUIRED)    Answer:   For use with right knee x-ray, bilateral AP and Rosenberg standing.    Order Specific Question:   Preferred imaging location?    Answer:   [facility]  . DG Knee Complete 4 Views Right    Please include patellar sunrise, lateral, and weightbearing bilateral AP and bilateral rosenberg views    Standing Status:   Future    Number of Occurrences:   1    Standing Expiration Date:   01/05/2018    Order Specific Question:   Reason for exam:    Answer:   Please include patellar sunrise, lateral, and weightbearing bilateral AP and bilateral rosenberg views    Comments:   Please include patellar sunrise, lateral, and weightbearing bilateral AP and bilateral rosenberg views    Order Specific Question:   Preferred imaging location?    Answer:   [facility]    Discussed warning signs or symptoms. Please see discharge instructions. Patient expresses understanding.

## 2016-11-05 NOTE — Telephone Encounter (Signed)
Pt has appointment Monday, will discuss with [name] then.

## 2016-11-06 DIAGNOSIS — Z77098 Contact with and (suspected) exposure to other hazardous, chiefly nonmedicinal, chemicals: Secondary | ICD-10-CM

## 2016-11-06 HISTORY — DX: Contact with and (suspected) exposure to other hazardous, chiefly nonmedicinal, chemicals: Z77.098

## 2016-11-07 ENCOUNTER — Ambulatory Visit: Payer: Self-pay | Admitting: Allergy and Immunology

## 2016-11-07 ENCOUNTER — Other Ambulatory Visit: Payer: Self-pay | Admitting: Physician Assistant

## 2016-11-07 DIAGNOSIS — Z77098 Contact with and (suspected) exposure to other hazardous, chiefly nonmedicinal, chemicals: Secondary | ICD-10-CM

## 2016-11-12 LAB — COMPLETE METABOLIC PANEL WITH GFR
ALBUMIN: 4.1 g/dL (ref 3.6–5.1)
ALK PHOS: 52 U/L (ref 33–130)
ALT: 12 U/L (ref 6–29)
AST: 17 U/L (ref 10–35)
BUN: 15 mg/dL (ref 7–25)
CALCIUM: 9.1 mg/dL (ref 8.6–10.4)
CHLORIDE: 104 mmol/L (ref 98–110)
CO2: 28 mmol/L (ref 20–31)
Creat: 0.75 mg/dL (ref 0.50–0.99)
GFR, EST NON AFRICAN AMERICAN: 85 mL/min (ref >=60)
GFR, Est African American: >89 mL/min (ref >=60)
Glucose, Bld: 93 mg/dL (ref 65–99)
POTASSIUM: 4.5 mmol/L (ref 3.5–5.3)
Sodium: 141 mmol/L (ref 135–146)
Total Bilirubin: 0.6 mg/dL (ref 0.2–1.2)
Total Protein: 6.6 g/dL (ref 6.1–8.1)

## 2016-11-13 ENCOUNTER — Encounter (HOSPITAL_BASED_OUTPATIENT_CLINIC_OR_DEPARTMENT_OTHER): Payer: Self-pay

## 2016-11-13 ENCOUNTER — Ambulatory Visit (HOSPITAL_BASED_OUTPATIENT_CLINIC_OR_DEPARTMENT_OTHER)
Admission: RE | Admit: 2016-11-13 | Discharge: 2016-11-13 | Disposition: A | Discharge status: Home / Self Care | Payer: Medicaid Other | Source: Ambulatory Visit | Attending: Physician Assistant | Admitting: Physician Assistant

## 2016-11-13 ENCOUNTER — Encounter (HOSPITAL_BASED_OUTPATIENT_CLINIC_OR_DEPARTMENT_OTHER): Payer: Self-pay | Admitting: Emergency Medicine

## 2016-11-13 ENCOUNTER — Telehealth: Payer: Self-pay | Admitting: Physician Assistant

## 2016-11-13 ENCOUNTER — Emergency Department (HOSPITAL_BASED_OUTPATIENT_CLINIC_OR_DEPARTMENT_OTHER)
Admission: EM | Admit: 2016-11-13 | Discharge: 2016-11-13 | Disposition: A | Discharge status: Home / Self Care | Payer: Medicaid Other | Attending: Emergency Medicine | Admitting: Emergency Medicine

## 2016-11-13 DIAGNOSIS — R911 Solitary pulmonary nodule: Secondary | ICD-10-CM | POA: Diagnosis not present

## 2016-11-13 DIAGNOSIS — I700 Atherosclerosis of aorta: Secondary | ICD-10-CM | POA: Diagnosis not present

## 2016-11-13 DIAGNOSIS — Z77098 Contact with and (suspected) exposure to other hazardous, chiefly nonmedicinal, chemicals: Secondary | ICD-10-CM | POA: Diagnosis not present

## 2016-11-13 DIAGNOSIS — R918 Other nonspecific abnormal finding of lung field: Secondary | ICD-10-CM | POA: Diagnosis present

## 2016-11-13 DIAGNOSIS — T7840XA Allergy, unspecified, initial encounter: Secondary | ICD-10-CM | POA: Diagnosis present

## 2016-11-13 DIAGNOSIS — Z87891 Personal history of nicotine dependence: Secondary | ICD-10-CM | POA: Diagnosis not present

## 2016-11-13 MED ORDER — PREDNISONE 50 MG PO TABS
60.0000 mg | ORAL_TABLET | Freq: Once | ORAL | Status: AC
  Administered 2016-11-13: 60 mg via ORAL
  Filled 2016-11-13: qty 1

## 2016-11-13 MED ORDER — DIPHENHYDRAMINE HCL 25 MG PO CAPS
50.0000 mg | ORAL_CAPSULE | Freq: Once | ORAL | Status: AC
  Administered 2016-11-13: 25 mg via ORAL
  Filled 2016-11-13: qty 2

## 2016-11-13 MED ORDER — FAMOTIDINE 20 MG PO TABS: 20.0000 mg | ORAL_TABLET | Freq: Two times a day (BID) | ORAL | 0 refills | Status: DC

## 2016-11-13 MED ORDER — IOPAMIDOL (ISOVUE-300) INJECTION 61%
100.0000 mL | Freq: Once | INTRAVENOUS | Status: AC | PRN
  Administered 2016-11-13: 80 mL via INTRAVENOUS

## 2016-11-13 MED ORDER — FAMOTIDINE 20 MG PO TABS
20.0000 mg | ORAL_TABLET | Freq: Once | ORAL | Status: AC
  Administered 2016-11-13: 20 mg via ORAL
  Filled 2016-11-13: qty 1

## 2016-11-13 MED ORDER — DIPHENHYDRAMINE HCL 25 MG PO TABS: 25.0000 mg | ORAL_TABLET | Freq: Four times a day (QID) | ORAL | 0 refills | Status: DC

## 2016-11-13 NOTE — ED Notes (Signed)
Pt now wants to take the additional 25mg  of Benadryl. Benadryl 25mg  po given; unable to amend previous entry.

## 2016-11-13 NOTE — ED Triage Notes (Signed)
Pt was getting outpatient CT when afterwards she started to feel swelling and tingling in her tongue.  Also feels like her throat was swelling.  No acute respiratory distress noted.

## 2016-11-13 NOTE — Progress Notes (Signed)
Call pt: calcium in normal range.  Labs ok for CT scan/contrast.

## 2016-11-13 NOTE — Telephone Encounter (Signed)
She would like a call to discuss the results of the cat scan and you're having a reaction to the dye that used for the cat scan and she is weak and sick on her stomach she is wanting you to call her.
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
## 2016-11-13 NOTE — Telephone Encounter (Signed)
Called patient and discussed results. Lung mass has decreased from original 54mm findings but increased for last CT in 2016 of 73mm. She will follow up with Dr. Lianne Moris at [facility].

## 2016-11-21 ENCOUNTER — Other Ambulatory Visit: Payer: Self-pay | Admitting: Physician Assistant

## 2016-11-23 ENCOUNTER — Telehealth: Payer: Self-pay | Admitting: Physician Assistant

## 2016-11-23 ENCOUNTER — Ambulatory Visit (INDEPENDENT_AMBULATORY_CARE_PROVIDER_SITE_OTHER): Payer: Medicaid Other | Admitting: Physician Assistant

## 2016-11-23 ENCOUNTER — Ambulatory Visit (HOSPITAL_BASED_OUTPATIENT_CLINIC_OR_DEPARTMENT_OTHER)
Admission: RE | Admit: 2016-11-23 | Discharge: 2016-11-23 | Disposition: A | Discharge status: Home / Self Care | Payer: Medicaid Other | Source: Ambulatory Visit | Attending: Physician Assistant | Admitting: Physician Assistant

## 2016-11-23 VITALS — BP 114/78 | HR 79 | Wt 148.0 lb

## 2016-11-23 DIAGNOSIS — M79662 Pain in left lower leg: Secondary | ICD-10-CM

## 2016-11-23 MED ORDER — GABAPENTIN 300 MG PO CAPS: 300.0000 mg | ORAL_CAPSULE | Freq: Every day | ORAL | 3 refills | Status: DC

## 2016-11-23 NOTE — Progress Notes (Signed)
HPI:                                                                Jamie [name] is a 66 y.o. female who presents to [facility]: [facility] today for "problems with left lower extremity"  Patient with PMH of anxiety, depression, lung mass, anemia and knee pain presents with LLE extremity "heaviness" x 2 months. Patient states it is worse at night and endorses intermittent numbness in her left calf. She states "it is not my restless legs." She is concerned that she has a blood clot. She denies calf pain, swelling, or erythema. She has no history of DVT. No recent immobilization, no hx of clotting disorder. She is currently being evaluated for a lung mass. CT chest 11/13/16 shows a stable 50mm LUL pulmonary nodule.  Past Medical History:  Diagnosis Date  . Anxiety   . Depression   . Diverticulitis   . Diverticulosis   . Headache   . Hyperlipidemia   . IBS (irritable bowel syndrome)   . Lung mass   . Syncope 03/08/2015   Past Surgical History:  Procedure Laterality Date  . ABDOMINAL HYSTERECTOMY    . HEMORRHOID SURGERY    . TUBAL LIGATION    . TUMOR REMOVAL     Left thigh    Social History  Substance Use Topics  . Smoking status: Former Smoker    Packs/day: 0.80    Years: 20.00    Types: Cigarettes    Quit date: 08/13/1985  . Smokeless tobacco: Never Used  . Alcohol use Yes     Comment: rare   family history includes Alcohol abuse in her sister; Arthritis in her sister; Cancer in her mother and sister; Diabetes in her mother; Fibromyalgia in her sister; Heart disease in her brother and father; Irritable bowel syndrome in her sister.  ROS: negative except as noted in the HPI  Medications: Current Outpatient Prescriptions  Medication Sig Dispense Refill  . ALPRAZolam (XANAX) 0.5 MG tablet Take 1 tablet (0.5 mg total) by mouth daily as needed. for anxiety 30 tablet 5  . diphenhydrAMINE (BENADRYL) 25 MG tablet Take 1 tablet (25 mg total) by  mouth every 6 (six) hours. 12 tablet 0  . EPINEPHrine 0.15 MG/0.15ML IJ injection Inject 0.15 mLs (0.15 mg total) into the muscle as needed for anaphylaxis. 2 Device 0  . famotidine (PEPCID) 20 MG tablet Take 1 tablet (20 mg total) by mouth 2 (two) times daily. 10 tablet 0  . Linaclotide (LINZESS) 145 MCG CAPS capsule Take 1 capsule (145 mcg total) by mouth daily. 30 capsule 5  . NEXIUM 40 MG capsule TAKE 1 CAPSULE(40 MG) BY MOUTH DAILY AS NEEDED 30 capsule 5   No current facility-administered medications for this visit.    Allergies  Allergen Reactions  . Ciprofloxacin     Facial numbness  . Iodinated Diagnostic Agents Other (See Comments)    Face and chest felt "hot", throat felt tight.   . Peanut Butter Flavor Nausea And Vomiting    N/V  . Shellfish Allergy Swelling       Objective:  BP 114/78   Pulse 79   Wt 148 lb (67.1 kg)   BMI 24.63 kg/m  Physical Exam  Constitutional: She is active. She does not appear ill. No distress.  Cardiovascular:  Pulses:      Dorsalis pedis pulses are 2+ on the right side, and 2+ on the left side.       Posterior tibial pulses are 2+ on the right side, and 2+ on the left side.  Musculoskeletal:       Right knee: Normal.       Left knee: Normal. She exhibits normal range of motion, no swelling, no effusion and no deformity.       Right lower leg: Normal.       Left lower leg: Normal. She exhibits no tenderness, no swelling, no edema and no deformity.  Normal gait and station, No peripheral edema  Neurological: She is alert. She has normal strength.  Skin: Skin is warm, dry and intact. No rash noted. No erythema.     CLINICAL DATA:  66 year old female with right knee pain for 2 weeks after fall off ladder. Initial encounter.  EXAM: LEFT KNEE - 1-2 VIEW; RIGHT KNEE - COMPLETE 4+ VIEW  COMPARISON:  None.  FINDINGS: Right knee four views:  No fracture or dislocation.  No significant degenerative changes.  Soft tissue  impression prepatellar region may be normal finding versus soft tissue injury.  Left knee two views:  No fracture or dislocation.  No significant joint space narrowing  IMPRESSION: No evidence of fracture or dislocation involving either knee. No significant joint space narrowing.  Soft tissue impression right prepatellar region may be normal finding versus soft tissue injury.   Electronically Signed   By: Genia Del M.D.   On: 11/05/2016 13:25  Assessment and Plan: 66 y.o. female with   1. Pain of left calf - very low suspicion for DVT, but patient expresses this as a concern and would like it ruled out - reviewed recent knee XR from 11/05/16 which were unremarkable - US Venous Img Lower Bilateral; Future - gabapentin (NEURONTIN) 300 MG capsule; Take 1 capsule (300 mg total) by mouth at bedtime.  Dispense: 30 capsule; Refill: 3  Patient education and anticipatory guidance given Patient agrees with treatment plan Follow-up with PCP in 1 week or sooner as needed  Darlyne Russian PA-C

## 2016-11-23 NOTE — Telephone Encounter (Signed)
-----   Message from [name] sent at 11/23/2016  9:22 AM EDT ----- Regarding: US venous They are sending this patient over for a STAT venous ultrasound.  She has medicaid, so she will need a precert but you have 48 hours to get it for STAT's.  Don't know how it works for weekends?  Thanks,  Hoyle Sauer

## 2016-11-23 NOTE — Patient Instructions (Signed)
-   Go to [facility] Imaging Department for ultrasound of your calf - You can take Gabapentin 1 capsule (300mg ) at bedtime  - Follow-up with [facility]

## 2016-11-23 NOTE — Telephone Encounter (Signed)
Called [employer], spoke with Judson Roch. Josem Kaufmann: [id number]. Valid: 11/23/16-12/23/16.

## 2016-11-29 ENCOUNTER — Ambulatory Visit: Payer: Medicaid Other | Admitting: Physician Assistant

## 2016-12-03 ENCOUNTER — Ambulatory Visit: Payer: Medicaid Other | Admitting: Physician Assistant

## 2016-12-09 ENCOUNTER — Emergency Department (HOSPITAL_BASED_OUTPATIENT_CLINIC_OR_DEPARTMENT_OTHER)
Admission: EM | Admit: 2016-12-09 | Discharge: 2016-12-09 | Disposition: A | Discharge status: Home / Self Care | Payer: Medicaid Other | Attending: Emergency Medicine | Admitting: Emergency Medicine

## 2016-12-09 ENCOUNTER — Encounter (HOSPITAL_BASED_OUTPATIENT_CLINIC_OR_DEPARTMENT_OTHER): Payer: Self-pay | Admitting: Respiratory Therapy

## 2016-12-09 DIAGNOSIS — Z79899 Other long term (current) drug therapy: Secondary | ICD-10-CM | POA: Diagnosis not present

## 2016-12-09 DIAGNOSIS — Z87891 Personal history of nicotine dependence: Secondary | ICD-10-CM | POA: Insufficient documentation

## 2016-12-09 DIAGNOSIS — T7840XA Allergy, unspecified, initial encounter: Secondary | ICD-10-CM | POA: Diagnosis present

## 2016-12-09 MED ORDER — EPINEPHRINE 0.3 MG/0.3ML IJ SOAJ: 0.3000 mg | Freq: Once | INTRAMUSCULAR | 1 refills | Status: AC

## 2016-12-09 MED ORDER — DIPHENHYDRAMINE HCL 50 MG/ML IJ SOLN
25.0000 mg | Freq: Once | INTRAMUSCULAR | Status: AC
  Administered 2016-12-09: 25 mg via INTRAVENOUS
  Filled 2016-12-09: qty 1

## 2016-12-09 MED ORDER — METHYLPREDNISOLONE SODIUM SUCC 125 MG IJ SOLR
125.0000 mg | Freq: Once | INTRAMUSCULAR | Status: AC
  Administered 2016-12-09: 125 mg via INTRAVENOUS
  Filled 2016-12-09: qty 2

## 2016-12-09 MED ORDER — FAMOTIDINE IN NACL 20-0.9 MG/50ML-% IV SOLN
20.0000 mg | Freq: Once | INTRAVENOUS | Status: AC
  Administered 2016-12-09: 20 mg via INTRAVENOUS
  Filled 2016-12-09: qty 50

## 2016-12-09 NOTE — ED Notes (Signed)
Pt states she "feels normal" except " very relaxed and dry mouth."

## 2016-12-09 NOTE — ED Triage Notes (Addendum)
Patient reports she ate salmon at [employer].  Reports she began feeling tingling in her lips.  States at present she experiences shortness of breath and heart racing.  Reports she did not have any treatment prior to arrival.  Reports pruritis and "feeling hot".  Patient appears flushed at present, alert and oriented, answering questions appropriately and maintaining secretions without difficulty.
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
## 2016-12-09 NOTE — ED Notes (Signed)
Pt given d/c instructions as per chart. Verbalizes understanding. No questions. Rx x 1 

## 2017-01-03 ENCOUNTER — Encounter: Payer: Self-pay | Admitting: Family Medicine

## 2017-01-03 ENCOUNTER — Ambulatory Visit (INDEPENDENT_AMBULATORY_CARE_PROVIDER_SITE_OTHER): Payer: Medicaid Other | Admitting: Family Medicine

## 2017-01-03 VITALS — BP 121/75 | HR 87 | Ht 65.0 in | Wt 148.0 lb

## 2017-01-03 DIAGNOSIS — M79671 Pain in right foot: Secondary | ICD-10-CM

## 2017-01-03 DIAGNOSIS — R1030 Lower abdominal pain, unspecified: Secondary | ICD-10-CM | POA: Diagnosis not present

## 2017-01-03 DIAGNOSIS — R21 Rash and other nonspecific skin eruption: Secondary | ICD-10-CM | POA: Diagnosis not present

## 2017-01-03 DIAGNOSIS — M79672 Pain in left foot: Secondary | ICD-10-CM | POA: Diagnosis not present

## 2017-01-03 MED ORDER — AMOXICILLIN-POT CLAVULANATE 875-125 MG PO TABS: 1.0000 | ORAL_TABLET | Freq: Two times a day (BID) | ORAL | 0 refills | Status: DC

## 2017-01-03 NOTE — Progress Notes (Signed)
Subjective:    Patient ID: Jamie [name], female    DOB: June 17, 1951, 66 y.o.   MRN: [id number]  HPI 7 days ago she ate 50 strawberries over 3 days and thinks it may have flared her diverticuilitis.  About 3 days after she ate them she started having abdominal pain. She's having lower abdominal pain across the left and right abdomen. She's been more gassy and belching. No nausea vomiting or diarrhea. She tends to have chronic constipation says she's having hard times with her stools. That she is taking her Linzess regularly. As it feels similar to when she had an episode of diverticulitis about a year and a half or 2 years ago. No fevers chills or sweats.  She asked if her xanax could be raised.    She also c/o of bilat heel pain - she says that she's having tenderness at the bottom and the back of the heels bilaterally. She says it's painful when she drives because she has put her heel down. As soon as she starts to put her weight on her feet it's very painful.  She would also like a referral to dermatology. She starting to get bumps that look like pimples on her facial cheeks and around the chin and below the nose area. She says sometimes she gets "white stuff" out of them.   Review of Systems  BP 121/75   Pulse 87   Ht 5\' 5"  (1.651 m)   Wt 148 lb (67.1 kg)   SpO2 100%   BMI 24.63 kg/m     Allergies  Allergen Reactions  . Ciprofloxacin     Facial numbness  . Iodinated Diagnostic Agents Other (See Comments)    Face and chest felt "hot", throat felt tight.   . Peanut Butter Flavor Nausea And Vomiting    N/V  . Shellfish Allergy Swelling    Past Medical History:  Diagnosis Date  . Anxiety   . Depression   . Diverticulitis   . Diverticulosis   . Headache   . Hyperlipidemia   . IBS (irritable bowel syndrome)   . Lung mass   . Syncope 03/08/2015    Past Surgical History:  Procedure Laterality Date  . ABDOMINAL HYSTERECTOMY    . HEMORRHOID SURGERY    . TUBAL LIGATION     . TUMOR REMOVAL     Left thigh     Social History   Social History  . Marital status: Divorced    Spouse name: N/A  . Number of children: 3  . Years of education: N/A   Occupational History  . Student    Social History Main Topics  . Smoking status: Former Smoker    Packs/day: 0.80    Years: 20.00    Types: Cigarettes    Quit date: 08/13/1985  . Smokeless tobacco: Never Used  . Alcohol use Yes     Comment: rare  . Drug use: No  . Sexual activity: Not on file   Other Topics Concern  . Not on file   Social History Narrative   Lives with and grandchild (she is foster parent for grand child)Completed school full time.   Caffeine use; occasional   Works in Personal assistant, Economist.   In process of divorce.            Family History  Problem Relation Age of Onset  . Cancer Mother        renal cell carcinoma  . Diabetes Mother   .  Heart disease Father        first MI in 82s  . Cancer Sister        cervical cancer  . Heart disease Brother        first MI in 85s  . Alcohol abuse Sister   . Fibromyalgia Sister   . Arthritis Sister        osteoarthritis  . Irritable bowel syndrome Sister   . Leukemia Unknown        grandson    Outpatient Encounter Prescriptions as of 01/03/2017  Medication Sig  . ALPRAZolam (XANAX) 0.5 MG tablet Take 1 tablet (0.5 mg total) by mouth daily as needed. for anxiety  . diphenhydrAMINE (BENADRYL) 25 MG tablet Take 1 tablet (25 mg total) by mouth every 6 (six) hours.  . famotidine (PEPCID) 20 MG tablet Take 1 tablet (20 mg total) by mouth 2 (two) times daily.  Marland Kitchen gabapentin (NEURONTIN) 300 MG capsule Take 1 capsule (300 mg total) by mouth at bedtime.  . Linaclotide (LINZESS) 145 MCG CAPS capsule Take 1 capsule (145 mcg total) by mouth daily.  Marland Kitchen NEXIUM 40 MG capsule TAKE 1 CAPSULE(40 MG) BY MOUTH DAILY AS NEEDED  . amoxicillin-clavulanate (AUGMENTIN) 875-125 MG tablet Take 1 tablet by mouth 2 (two) times daily.   No  facility-administered encounter medications on file as of 01/03/2017.          Objective:   Physical Exam  Constitutional: She is oriented to person, place, and time. She appears well-developed and well-nourished.  HENT:  Head: Normocephalic and atraumatic.  Cardiovascular: Normal rate, regular rhythm and normal heart sounds.   Pulmonary/Chest: Effort normal and breath sounds normal.  Abdominal: Soft. Bowel sounds are normal. She exhibits distension. She exhibits no mass. There is no tenderness. There is no rebound and no guarding.  + diffuse tenderness.   Neurological: She is alert and oriented to person, place, and time.  Skin: Skin is warm and dry.  She has a few scattered erythematous papules around her mouth.  Psychiatric: She has a normal mood and affect. Her behavior is normal.        Assessment & Plan:  Lower abdominal pain-questionable whether or not it's really full-blown diverticulitis. Medical head and put her on Augmentin for 7 days. She feels like she's getting worse or not improving them please let me know. No red flag symptoms for abdominal pain such as rebound or guarding. Did encourage her to hydrate well and eat a bland soft diet over the weekend. Check CBC with differential.  Heel pain-gave her some stretches for Achilles tendinitis. If she's not improving then come back in to see her PCP for further evaluation and workup. I did not actually do an exam for this today.  Rash-rash on her face looks somewhat consistent with rosacea that she doesn't have a lot of underlying redness. Mostly just as few scattered papules. She also wants a yearly full skin check so we'll place referral to dermatology.

## 2017-01-18 ENCOUNTER — Telehealth: Payer: Self-pay

## 2017-01-18 NOTE — Telephone Encounter (Signed)
Jamie [name] states she will not see a GI doctor. She states she ate about 50 strawberries and it brought on diverticulitis. She would like the antibiotic that [name] usually calls in. Please advise.

## 2017-01-18 NOTE — Telephone Encounter (Signed)
If not better need to get a CT scan again without giving you more antibiotics. Are you ok to have scheduled?

## 2017-01-18 NOTE — Telephone Encounter (Signed)
Jamie [name] called and left a message stating the Augmentin is not helping her diverticulitis. She would like [name] to call in doxycycline. See plan from visit below.     Dr Gardiner Ramus plan Lower abdominal pain-questionable whether or not it's really full-blown diverticulitis. Medical head and put her on Augmentin for 7 days. She feels like she's getting worse or not improving them please let me know. No red flag symptoms for abdominal pain such as rebound or guarding. Did encourage her to hydrate well and eat a bland soft diet over the weekend. Check CBC with differential.

## 2017-01-18 NOTE — Telephone Encounter (Signed)
If Augmentin did not help then it probably is not diverticulitis. Doxycycline is not indicated for Diverticulitis and infact taking antibiotics back to back can do more harm than good. I think you need to see GI. You have had CT scans in the past and I don't want to continue to do those due to radiation exposure.

## 2017-01-21 ENCOUNTER — Telehealth: Payer: Self-pay | Admitting: *Deleted

## 2017-01-21 NOTE — Telephone Encounter (Signed)
Transferred pt to you on 6.11.18 @ 8:68 pm. PT wants certain labs created for the issues that she is experiencing

## 2017-01-23 ENCOUNTER — Encounter: Payer: Self-pay | Admitting: Physician Assistant

## 2017-01-23 ENCOUNTER — Ambulatory Visit (INDEPENDENT_AMBULATORY_CARE_PROVIDER_SITE_OTHER): Payer: Medicaid Other | Admitting: Physician Assistant

## 2017-01-23 VITALS — BP 113/72 | HR 92 | Ht 65.0 in | Wt 148.0 lb

## 2017-01-23 DIAGNOSIS — R319 Hematuria, unspecified: Secondary | ICD-10-CM | POA: Diagnosis not present

## 2017-01-23 DIAGNOSIS — H539 Unspecified visual disturbance: Secondary | ICD-10-CM | POA: Diagnosis not present

## 2017-01-23 DIAGNOSIS — R1032 Left lower quadrant pain: Secondary | ICD-10-CM

## 2017-01-23 DIAGNOSIS — Z8719 Personal history of other diseases of the digestive system: Secondary | ICD-10-CM | POA: Diagnosis not present

## 2017-01-23 LAB — POCT URINALYSIS DIPSTICK
BILIRUBIN UA: NEGATIVE
Glucose, UA: NEGATIVE
KETONES UA: NEGATIVE
NITRITE UA: NEGATIVE
PH UA: 5.5 (ref 5.0–8.0)
Protein, UA: NEGATIVE
Spec Grav, UA: 1.02 (ref 1.010–1.025)
Urobilinogen, UA: 0.2 E.U./dL

## 2017-01-23 LAB — CBC WITH DIFFERENTIAL/PLATELET
BASOS ABS: 38 {cells}/uL (ref 0–200)
Basophils Relative: 1 %
EOS ABS: 38 {cells}/uL (ref 15–500)
Eosinophils Relative: 1 %
HCT: 39 % (ref 35.0–45.0)
HEMOGLOBIN: 12.9 g/dL (ref 11.7–15.5)
LYMPHS ABS: 1064 {cells}/uL (ref 850–3900)
Lymphocytes Relative: 28 %
MCH: 29.6 pg (ref 27.0–33.0)
MCHC: 33.1 g/dL (ref 32.0–36.0)
MCV: 89.4 fL (ref 80.0–100.0)
MONO ABS: 266 {cells}/uL (ref 200–950)
MPV: 9.7 fL (ref 7.5–12.5)
Monocytes Relative: 7 %
NEUTROS ABS: 2394 {cells}/uL (ref 1500–7800)
NEUTROS PCT: 63 %
Platelets: 288 10*3/uL (ref 140–400)
RBC: 4.36 MIL/uL (ref 3.80–5.10)
RDW: 13.3 % (ref 11.0–15.0)
WBC: 3.8 10*3/uL (ref 3.8–10.8)

## 2017-01-23 LAB — LIPASE: Lipase: 25 U/L (ref 7–60)

## 2017-01-23 LAB — GLUCOSE, POCT (MANUAL RESULT ENTRY): POC Glucose: 151 mg/dl — AB (ref 70–99)

## 2017-01-23 MED ORDER — CIPROFLOXACIN HCL 500 MG PO TABS: 500.0000 mg | ORAL_TABLET | Freq: Two times a day (BID) | ORAL | 0 refills | Status: DC

## 2017-01-23 MED ORDER — METRONIDAZOLE 500 MG PO TABS: 500.0000 mg | ORAL_TABLET | Freq: Three times a day (TID) | ORAL | 0 refills | Status: DC

## 2017-01-23 NOTE — Telephone Encounter (Signed)
Pt seen in office today.
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
## 2017-01-23 NOTE — Patient Instructions (Signed)

## 2017-01-24 LAB — COMPLETE METABOLIC PANEL WITH GFR
ALBUMIN: 4.3 g/dL (ref 3.6–5.1)
ALK PHOS: 57 U/L (ref 33–130)
ALT: 14 U/L (ref 6–29)
AST: 19 U/L (ref 10–35)
BUN: 14 mg/dL (ref 7–25)
CHLORIDE: 106 mmol/L (ref 98–110)
CO2: 26 mmol/L (ref 20–31)
Calcium: 9.3 mg/dL (ref 8.6–10.4)
Creat: 0.84 mg/dL (ref 0.50–0.99)
GFR, EST NON AFRICAN AMERICAN: 74 mL/min (ref >=60)
GFR, Est African American: 85 mL/min (ref >=60)
GLUCOSE: 126 mg/dL — AB (ref 65–99)
POTASSIUM: 4.7 mmol/L (ref 3.5–5.3)
SODIUM: 143 mmol/L (ref 135–146)
Total Bilirubin: 0.6 mg/dL (ref 0.2–1.2)
Total Protein: 6.6 g/dL (ref 6.1–8.1)

## 2017-01-24 NOTE — Progress Notes (Signed)
Call pt: kidneys look great, liver looks great. There is no elevated WBC which means diverticulitis is less likely since with infections your WBC should be elevated.  Lipase is normal. Your pancreas seems to be doing fine.

## 2017-01-25 ENCOUNTER — Encounter: Payer: Self-pay | Admitting: Physician Assistant

## 2017-01-25 DIAGNOSIS — H539 Unspecified visual disturbance: Secondary | ICD-10-CM

## 2017-01-25 DIAGNOSIS — R1032 Left lower quadrant pain: Secondary | ICD-10-CM

## 2017-01-25 DIAGNOSIS — Z8719 Personal history of other diseases of the digestive system: Secondary | ICD-10-CM | POA: Insufficient documentation

## 2017-01-25 DIAGNOSIS — R319 Hematuria, unspecified: Secondary | ICD-10-CM

## 2017-01-25 HISTORY — DX: Left lower quadrant pain: R10.32

## 2017-01-25 HISTORY — DX: Hematuria, unspecified: R31.9

## 2017-01-25 HISTORY — DX: Personal history of other diseases of the digestive system: Z87.19

## 2017-01-25 HISTORY — DX: Unspecified visual disturbance: H53.9

## 2017-01-25 LAB — URINE CULTURE

## 2017-01-25 NOTE — Progress Notes (Signed)
Yes I would stop abx. Keep to a bland diet for the next week and see how symptoms progress. Probiotics could be a great thing to try as well.

## 2017-01-29 NOTE — Telephone Encounter (Signed)
Patient seen in office visit and advised.

## 2017-02-04 ENCOUNTER — Emergency Department (HOSPITAL_BASED_OUTPATIENT_CLINIC_OR_DEPARTMENT_OTHER): Payer: Medicaid Other

## 2017-02-04 ENCOUNTER — Encounter: Payer: Self-pay | Admitting: Emergency Medicine

## 2017-02-04 ENCOUNTER — Emergency Department (INDEPENDENT_AMBULATORY_CARE_PROVIDER_SITE_OTHER)
Admission: EM | Admit: 2017-02-04 | Discharge: 2017-02-04 | Disposition: A | Discharge status: Home / Self Care | Payer: Medicaid Other | Source: Home / Self Care | Attending: Family Medicine | Admitting: Family Medicine

## 2017-02-04 ENCOUNTER — Encounter (HOSPITAL_BASED_OUTPATIENT_CLINIC_OR_DEPARTMENT_OTHER): Payer: Self-pay | Admitting: *Deleted

## 2017-02-04 ENCOUNTER — Emergency Department (HOSPITAL_BASED_OUTPATIENT_CLINIC_OR_DEPARTMENT_OTHER)
Admission: EM | Admit: 2017-02-04 | Discharge: 2017-02-04 | Disposition: A | Discharge status: Home / Self Care | Payer: Medicaid Other | Attending: Emergency Medicine | Admitting: Emergency Medicine

## 2017-02-04 DIAGNOSIS — F419 Anxiety disorder, unspecified: Secondary | ICD-10-CM | POA: Insufficient documentation

## 2017-02-04 DIAGNOSIS — R200 Anesthesia of skin: Secondary | ICD-10-CM | POA: Insufficient documentation

## 2017-02-04 DIAGNOSIS — Z8719 Personal history of other diseases of the digestive system: Secondary | ICD-10-CM | POA: Diagnosis not present

## 2017-02-04 DIAGNOSIS — M545 Low back pain, unspecified: Secondary | ICD-10-CM

## 2017-02-04 DIAGNOSIS — Z91010 Allergy to peanuts: Secondary | ICD-10-CM | POA: Diagnosis not present

## 2017-02-04 DIAGNOSIS — R202 Paresthesia of skin: Secondary | ICD-10-CM | POA: Diagnosis not present

## 2017-02-04 DIAGNOSIS — R1030 Lower abdominal pain, unspecified: Secondary | ICD-10-CM | POA: Insufficient documentation

## 2017-02-04 DIAGNOSIS — R109 Unspecified abdominal pain: Secondary | ICD-10-CM

## 2017-02-04 DIAGNOSIS — Z79899 Other long term (current) drug therapy: Secondary | ICD-10-CM | POA: Diagnosis not present

## 2017-02-04 DIAGNOSIS — Z87891 Personal history of nicotine dependence: Secondary | ICD-10-CM | POA: Insufficient documentation

## 2017-02-04 LAB — COMPREHENSIVE METABOLIC PANEL
ALT: 28 U/L (ref 14–54)
AST: 27 U/L (ref 15–41)
Albumin: 4.2 g/dL (ref 3.5–5.0)
Alkaline Phosphatase: 53 U/L (ref 38–126)
Anion gap: 5 (ref 5–15)
BUN: 15 mg/dL (ref 6–20)
CO2: 30 mmol/L (ref 22–32)
Calcium: 9.1 mg/dL (ref 8.9–10.3)
Chloride: 105 mmol/L (ref 101–111)
Creatinine, Ser: 0.87 mg/dL (ref 0.44–1.00)
GFR calc Af Amer: >60 mL/min (ref >60)
GFR calc non Af Amer: >60 mL/min (ref >60)
Glucose, Bld: 93 mg/dL (ref 65–99)
Potassium: 4.4 mmol/L (ref 3.5–5.1)
Sodium: 140 mmol/L (ref 135–145)
Total Bilirubin: 0.5 mg/dL (ref 0.3–1.2)
Total Protein: 7.2 g/dL (ref 6.5–8.1)

## 2017-02-04 LAB — URINALYSIS, ROUTINE W REFLEX MICROSCOPIC
Bilirubin Urine: NEGATIVE
Glucose, UA: NEGATIVE mg/dL
Hgb urine dipstick: NEGATIVE
Ketones, ur: NEGATIVE mg/dL
Leukocytes, UA: NEGATIVE
Nitrite: NEGATIVE
Protein, ur: NEGATIVE mg/dL
Specific Gravity, Urine: 1.013 (ref 1.005–1.030)
pH: 7 (ref 5.0–8.0)

## 2017-02-04 LAB — CBC WITH DIFFERENTIAL/PLATELET
Basophils Absolute: 0 10*3/uL (ref 0.0–0.1)
Basophils Relative: 1 %
Eosinophils Absolute: 0 10*3/uL (ref 0.0–0.7)
Eosinophils Relative: 1 %
HCT: 38.5 % (ref 36.0–46.0)
Hemoglobin: 13.8 g/dL (ref 12.0–15.0)
Lymphocytes Relative: 22 %
Lymphs Abs: 1.2 10*3/uL (ref 0.7–4.0)
MCH: 31.6 pg (ref 26.0–34.0)
MCHC: 35.8 g/dL (ref 30.0–36.0)
MCV: 88.1 fL (ref 78.0–100.0)
Monocytes Absolute: 0.4 10*3/uL (ref 0.1–1.0)
Monocytes Relative: 8 %
Neutro Abs: 3.6 10*3/uL (ref 1.7–7.7)
Neutrophils Relative %: 68 %
Platelets: 309 10*3/uL (ref 150–400)
RBC: 4.37 MIL/uL (ref 3.87–5.11)
RDW: 12.4 % (ref 11.5–15.5)
WBC: 5.3 10*3/uL (ref 4.0–10.5)

## 2017-02-04 LAB — SEDIMENTATION RATE: Sed Rate: 11 mm/hr (ref 0–30)

## 2017-02-04 LAB — LIPASE, BLOOD: Lipase: 32 U/L (ref 11–51)

## 2017-02-04 MED ORDER — SODIUM CHLORIDE 0.9 % IV SOLN
Freq: Once | INTRAVENOUS | Status: AC
  Administered 2017-02-04: 13:00:00 via INTRAVENOUS
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
## 2017-02-04 NOTE — ED Triage Notes (Signed)
Pt reports generalized body aches 'over 98% of my body' x 2 weeks. Has been on antibiotics for diverticulitis without relief. Reports HA, generalized abdominal pain, bilateral arms and legs 'burning and stinging'.  Pt has seen her PCP for same.

## 2017-02-04 NOTE — ED Triage Notes (Signed)
Lower abdominal pain, stinging and burning moving up under her breasts, around to her spine and to her upper back causing pain. She saw [name] 1.5 weeks ago was given Cipro and Flagyl with no improvement, she says she has gotten worse. 8/10 pain.

## 2017-02-04 NOTE — ED Provider Notes (Signed)
CSN: [id number]     Arrival date & time 02/04/17  6579 History   First MD Initiated Contact with Patient 02/04/17 [phone]     Chief Complaint  Patient presents with  . Abdominal Pain   (Consider location/radiation/quality/duration/timing/severity/associated sxs/prior Treatment) HPI Jamie [name] is a 66 y.o. female presenting to UC with c/o diffuse, gradually worsening stinging and burning all over her body that initially started in lower abdomen and has spread to under her breasts and around to her back. No hx of back problems, including no back surgeries or pain.  She was seen by her PCP about 2 weeks ago and prescribed Cipro and Flagyl for possible diverticulitis but symptoms have only worsened. She did complete the antibiotics but no relief. Denies nausea or vomiting but she does have darkened stools. She has not seen blood in her stool.  Pt questions if the dark stool is from her antibiotics.  Denies rashes or known insect bites. Denies fever or chills. Denies urinary symptoms. Labs from 01/23/17 included CBC, CMP Lipase, and UA that were unremarkable.  Per medical records, it was recommended pt f/u with a GI specialist, she has not f/u with one yet.   Past Medical History:  Diagnosis Date  . Anxiety   . Depression   . Diverticulitis   . Diverticulosis   . Headache   . Hyperlipidemia   . IBS (irritable bowel syndrome)   . Lung mass   . Syncope 03/08/2015   Past Surgical History:  Procedure Laterality Date  . ABDOMINAL HYSTERECTOMY    . HEMORRHOID SURGERY    . TUBAL LIGATION    . TUMOR REMOVAL     Left thigh    Family History  Problem Relation Age of Onset  . Cancer Mother        renal cell carcinoma  . Diabetes Mother   . Heart disease Father        first MI in 87s  . Cancer Sister        cervical cancer  . Heart disease Brother        first MI in 21s  . Alcohol abuse Sister   . Fibromyalgia Sister   . Arthritis Sister        osteoarthritis  . Irritable bowel syndrome  Sister   . Leukemia Unknown        grandson   Social History  Substance Use Topics  . Smoking status: Former Smoker    Packs/day: 0.80    Years: 20.00    Types: Cigarettes    Quit date: 08/13/1985  . Smokeless tobacco: Never Used  . Alcohol use Yes     Comment: rare   OB History    No data available     Review of Systems  Constitutional: Negative for chills and fever.  HENT: Negative for congestion, ear pain, sore throat, trouble swallowing and voice change.   Respiratory: Negative for cough and shortness of breath.   Cardiovascular: Negative for chest pain and palpitations.  Gastrointestinal: Positive for abdominal pain (diffuse, stinging and burning). Negative for diarrhea, nausea and vomiting.  Genitourinary: Negative for dysuria, flank pain, frequency, pelvic pain and urgency.  Musculoskeletal: Negative for arthralgias, back pain and myalgias.  Skin: Negative for rash.  Neurological: Positive for numbness. Negative for dizziness, light-headedness and headaches.       Diffuse tingling and burning mainly in trunk of her body    Allergies  Ciprofloxacin; Iodinated diagnostic agents; Peanut butter flavor; and Shellfish allergy  Home Medications   Prior to Admission medications   Medication Sig Start Date End Date Taking? Authorizing Provider  ALPRAZolam Duanne Moron) 0.5 MG tablet Take 1 tablet (0.5 mg total) by mouth daily as needed. for anxiety 11/05/16   [name] L, PA-C  diphenhydrAMINE (BENADRYL) 25 MG tablet Take 1 tablet (25 mg total) by mouth every 6 (six) hours. 11/13/16   Jola Schmidt, MD  famotidine (PEPCID) 20 MG tablet Take 1 tablet (20 mg total) by mouth 2 (two) times daily. 11/13/16   Jola Schmidt, MD  gabapentin (NEURONTIN) 300 MG capsule Take 1 capsule (300 mg total) by mouth at bedtime. 11/23/16   Trixie Dredge, PA-C  Linaclotide [facility]) 145 MCG CAPS capsule Take 1 capsule (145 mcg total) by mouth daily. 07/19/15   Breeback, [name] L, PA-C  NEXIUM  40 MG capsule TAKE 1 CAPSULE(40 MG) BY MOUTH DAILY AS NEEDED 06/14/16   [name] L, PA-C   Meds Ordered and Administered this Visit  Medications - No data to display  BP 111/74 (BP Location: Left Arm)   Pulse 90   Temp 97.8 F (36.6 C) (Oral)   Ht 5\' 5"  (1.651 m)   Wt 149 lb (67.6 kg)   SpO2 100%   BMI 24.79 kg/m  No data found.   Physical Exam  Constitutional: She is oriented to person, place, and time. She appears well-developed and well-nourished. No distress.  HENT:  Head: Normocephalic and atraumatic.  Mouth/Throat: Oropharynx is clear and moist.  Eyes: EOM are normal.  Neck: Normal range of motion. Neck supple.  Cardiovascular: Normal rate and regular rhythm.   Pulmonary/Chest: Effort normal and breath sounds normal. No stridor. No respiratory distress. She has no wheezes. She has no rales.  Abdominal: Soft. Bowel sounds are normal. She exhibits no distension and no mass. There is no tenderness. There is no rebound and no guarding. No hernia.  Musculoskeletal: Normal range of motion. She exhibits no edema or tenderness.  Lymphadenopathy:    She has no cervical adenopathy.  Neurological: She is alert and oriented to person, place, and time.  Skin: Skin is warm and dry. No rash noted. She is not diaphoretic. No erythema.  Psychiatric: She has a normal mood and affect. Her behavior is normal.  Nursing note and vitals reviewed.   Urgent Care Course     Procedures (including critical care time)  Labs Review Labs Reviewed  ROCKY MTN SPOTTED FVR ABS PNL(IGG+IGM)  B. BURGDORFI ANTIBODIES  SEDIMENTATION RATE  C-REACTIVE PROTEIN  VITAMIN B12    Imaging Review No results found.   MDM   1. Paresthesia    Pt c/o diffuse stinging and burning for at least 2 weeks w/o rash. Vitals: WNL  Due to summer season and pt uncertain of insect bites, will order tick labs- RMSF and Lymes as well as inflammatory labs- C-reactive protein and Sed Rate for initial workup in  UC setting  Pt requested Vit B12 also be checked.  Offered pt Prednisone or antiinflammatories while labs pending, pt declined.  Encouraged f/u with PCP in 1-2 weeks as needed.    Noe Gens, [place] 02/04/17 1158

## 2017-02-04 NOTE — Discharge Instructions (Signed)
It was my pleasure taking care of you today!   Fortunately, your lab work and imaging was very reassuring.  At this time, there does not appear to be an acute, emergent cause for your abdominal pain. However, this does not mean that your abdominal pain may not become an emergency in the future. It is VERY important that you monitor your symptoms and return to the Emergency Department if you develop any of the following symptoms:  The pain does not go away.  You have a fever.  You keep throwing up and can't keep fluids down.  You pass bloody or black tarry stools.  There is bright red blood in the stool. You do not seem to be getting better.  You have any questions or concerns.

## 2017-02-05 ENCOUNTER — Telehealth: Payer: Self-pay | Admitting: Physician Assistant

## 2017-02-05 ENCOUNTER — Telehealth: Payer: Self-pay | Admitting: *Deleted

## 2017-02-05 LAB — LYME AB/WESTERN BLOT REFLEX: B burgdorferi Ab IgG+IgM: <0.9 Index (ref <0.90)

## 2017-02-05 LAB — VITAMIN B12: Vitamin B-12: 204 pg/mL (ref 200–1100)

## 2017-02-05 LAB — C-REACTIVE PROTEIN: CRP: 3 mg/L (ref <8.0)

## 2017-02-05 LAB — ROCKY MTN SPOTTED FVR ABS PNL(IGG+IGM)
RMSF IgG: NOT DETECTED
RMSF IgM: NOT DETECTED

## 2017-02-05 NOTE — Telephone Encounter (Signed)
Callback: Patient lab results given and discussed. Encouraged f/u with PCP and GI as recommended previously.

## 2017-02-05 NOTE — Telephone Encounter (Signed)
Patient called advised that she had a Cat scan done and they found something in her colon.  Pt would like to get a colonoscopy and the other test where they go down her throat test as well. Patient wants to see Dr. Ardis Hughs and has spoke with [facility] about getting colonoscopy before she went out on leave. Thanks

## 2017-02-06 NOTE — Telephone Encounter (Signed)
Please have patient schedule a follow up appointment with me or another provider to go over results and do a physical exam.  We need to go a correct referral so we do not go off half cocked.

## 2017-02-06 NOTE — Telephone Encounter (Signed)
Patient called 12:00pm left voicemail that she needs to get that appointment scheduled with Dr. Ardis Hughs as soon as possible. [name] needs a referral she is Jamie [name] and [name] is out due to personal reasons. See other phone message from 02/05/17. Thanks

## 2017-02-06 NOTE — Telephone Encounter (Signed)
Left detailed message on patient vm with advise as noted below. Jamie [name],CMA  

## 2017-02-07 ENCOUNTER — Encounter: Payer: Self-pay | Admitting: Family Medicine

## 2017-02-07 ENCOUNTER — Ambulatory Visit (INDEPENDENT_AMBULATORY_CARE_PROVIDER_SITE_OTHER): Payer: Medicaid Other | Admitting: Family Medicine

## 2017-02-07 ENCOUNTER — Telehealth: Payer: Self-pay | Admitting: Gastroenterology

## 2017-02-07 VITALS — BP 101/69 | HR 87 | Ht 65.0 in | Wt 147.0 lb

## 2017-02-07 DIAGNOSIS — K5720 Diverticulitis of large intestine with perforation and abscess without bleeding: Secondary | ICD-10-CM

## 2017-02-07 MED ORDER — LINACLOTIDE 145 MCG PO CAPS: 145.0000 ug | ORAL_CAPSULE | Freq: Every day | ORAL | 3 refills | Status: DC

## 2017-02-07 NOTE — Patient Instructions (Signed)
Thank you for coming in today. You should hear from Dr Ardis Hughs office soon.  You can call them yourself and schedule your own visit sooner if you like.  [phone]  Restart [facility].  Return sooner if needed.   If your belly pain worsens, or you have high fever, bad vomiting, blood in your stool or black tarry stool go to the Emergency Room.

## 2017-02-07 NOTE — Progress Notes (Signed)
Jamie [name] is a 66 y.o. female who presents to [facility] [place] today for abdominal pain. Patient has had intermittent abdominal pain for a while now. She was seen by her PCP who ultimately obtained a CT scan of the abdomen and pelvis which showed diverticula and muscle hypertrophy of the colon. She notes that her symptoms did not improve much with antibiotics. She notes discomfort and abdominal pain. She notes that she's been out of [place] for some time now and would like to restart it.   Past Medical History:  Diagnosis Date  . Anxiety   . Depression   . Diverticulitis   . Diverticulosis   . Headache   . Hyperlipidemia   . IBS (irritable bowel syndrome)   . Lung mass   . Syncope 03/08/2015   Past Surgical History:  Procedure Laterality Date  . ABDOMINAL HYSTERECTOMY    . HEMORRHOID SURGERY    . TUBAL LIGATION    . TUMOR REMOVAL     Left thigh    Social History  Substance Use Topics  . Smoking status: Former Smoker    Packs/day: 0.80    Years: 20.00    Types: Cigarettes    Quit date: 08/13/1985  . Smokeless tobacco: Never Used  . Alcohol use Yes     Comment: rare   family history includes Alcohol abuse in her sister; Arthritis in her sister; Cancer in her mother and sister; Diabetes in her mother; Fibromyalgia in her sister; Heart disease in her brother and father; Irritable bowel syndrome in her sister.  ROS as above:  Medications: Current Outpatient Prescriptions  Medication Sig Dispense Refill  . ALPRAZolam (XANAX) 0.5 MG tablet Take 1 tablet (0.5 mg total) by mouth daily as needed. for anxiety 30 tablet 5  . diphenhydrAMINE (BENADRYL) 25 MG tablet Take 1 tablet (25 mg total) by mouth every 6 (six) hours. 12 tablet 0  . famotidine (PEPCID) 20 MG tablet Take 1 tablet (20 mg total) by mouth 2 (two) times daily. 10 tablet 0  . gabapentin (NEURONTIN) 300  MG capsule Take 1 capsule (300 mg total) by mouth at bedtime. 30 capsule 3  . linaclotide (LINZESS) 145 MCG CAPS capsule Take 1 capsule (145 mcg total) by mouth daily. 90 capsule 3  . NEXIUM 40 MG capsule TAKE 1 CAPSULE(40 MG) BY MOUTH DAILY AS NEEDED 30 capsule 5   No current facility-administered medications for this visit.    Allergies  Allergen Reactions  . Ciprofloxacin     Facial numbness  . Iodinated Diagnostic Agents Other (See Comments)    Face and chest felt "hot", throat felt tight.   . Peanut Butter Flavor Nausea And Vomiting    N/V  . Shellfish Allergy Swelling    Health Maintenance Health Maintenance  Topic Date Due  . Hepatitis C Screening  1951-04-15  . TETANUS/TDAP  08/14/2015  . PAP SMEAR  07/18/2045 (Originally 11/11/2012)  . INFLUENZA VACCINE  03/13/2017  . MAMMOGRAM  05/11/2018  . DEXA SCAN  05/23/2018  . COLONOSCOPY  07/19/2018  . HIV Screening  Completed     Exam:  BP 101/69   Pulse 87   Ht '5\' 5"'  (1.651 m)   Wt 147 lb (66.7 kg)   BMI 24.46 kg/m  Gen: Well NAD HEENT: EOMI,  MMM Lungs: Normal work of breathing. CTABL Heart: RRR no MRG Abd: NABS, Soft. Nondistended, Nontender Exts: Brisk capillary refill, warm and well  perfused.    Results for orders placed or performed during the hospital encounter of 02/04/17 (from the past 72 hour(s))  Urinalysis, Routine w reflex microscopic     Status: None   Collection Time: 02/04/17 12:48 PM  Result Value Ref Range   Color, Urine YELLOW YELLOW   APPearance CLEAR CLEAR   Specific Gravity, Urine 1.013 1.005 - 1.030   pH 7.0 5.0 - 8.0   Glucose, UA NEGATIVE NEGATIVE mg/dL   Hgb urine dipstick NEGATIVE NEGATIVE   Bilirubin Urine NEGATIVE NEGATIVE   Ketones, ur NEGATIVE NEGATIVE mg/dL   Protein, ur NEGATIVE NEGATIVE mg/dL   Nitrite NEGATIVE NEGATIVE   Leukocytes, UA NEGATIVE NEGATIVE    Comment: Microscopic not done on urines with negative protein, blood, leukocytes, nitrite, or glucose < 500 mg/dL.    CBC with Differential     Status: None   Collection Time: 02/04/17  1:00 PM  Result Value Ref Range   WBC 5.3 4.0 - 10.5 K/uL   RBC 4.37 3.87 - 5.11 MIL/uL   Hemoglobin 13.8 12.0 - 15.0 g/dL   HCT 38.5 36.0 - 46.0 %   MCV 88.1 78.0 - 100.0 fL   MCH 31.6 26.0 - 34.0 pg   MCHC 35.8 30.0 - 36.0 g/dL   RDW 12.4 11.5 - 15.5 %   Platelets 309 150 - 400 K/uL   Neutrophils Relative % 68 %   Neutro Abs 3.6 1.7 - 7.7 K/uL   Lymphocytes Relative 22 %   Lymphs Abs 1.2 0.7 - 4.0 K/uL   Monocytes Relative 8 %   Monocytes Absolute 0.4 0.1 - 1.0 K/uL   Eosinophils Relative 1 %   Eosinophils Absolute 0.0 0.0 - 0.7 K/uL   Basophils Relative 1 %   Basophils Absolute 0.0 0.0 - 0.1 K/uL  Comprehensive metabolic panel     Status: None   Collection Time: 02/04/17  1:00 PM  Result Value Ref Range   Sodium 140 135 - 145 mmol/L   Potassium 4.4 3.5 - 5.1 mmol/L   Chloride 105 101 - 111 mmol/L   CO2 30 22 - 32 mmol/L   Glucose, Bld 93 65 - 99 mg/dL   BUN 15 6 - 20 mg/dL   Creatinine, Ser 0.87 0.44 - 1.00 mg/dL   Calcium 9.1 8.9 - 10.3 mg/dL   Total Protein 7.2 6.5 - 8.1 g/dL   Albumin 4.2 3.5 - 5.0 g/dL   AST 27 15 - 41 U/L   ALT 28 14 - 54 U/L   Alkaline Phosphatase 53 38 - 126 U/L   Total Bilirubin 0.5 0.3 - 1.2 mg/dL   GFR calc non Af Amer >60 >60 mL/min   GFR calc Af Amer >60 >60 mL/min    Comment: (NOTE) The eGFR has been calculated using the CKD EPI equation. This calculation has not been validated in all clinical situations. eGFR's persistently <60 mL/min signify possible Chronic Kidney Disease.    Anion gap 5 5 - 15  Lipase, blood     Status: None   Collection Time: 02/04/17  1:00 PM  Result Value Ref Range   Lipase 32 11 - 51 U/L   Ct Abdomen Pelvis Wo Contrast  Result Date: 02/04/2017 CLINICAL DATA:  66 year old female with generalized body aches for the past 2 weeks. On antibiotics for diverticulitis without relief. Urine oval bowel syndrome. Post hysterectomy. Initial  encounter. EXAM: CT ABDOMEN AND PELVIS WITHOUT CONTRAST TECHNIQUE: Multidetector CT imaging of the abdomen and pelvis was performed  following the standard protocol without IV contrast. COMPARISON:  06/15/2015 CT. FINDINGS: Lower chest: Minimal lung base subpleural nodularity/scarring stable without worrisome abnormality noted. Hepatobiliary: Taking into account limitation by non contrast imaging, no worrisome hepatic lesion. Tiny calcification noted. No calcified gallstone. Pancreas: Taking into account limitation by non contrast imaging, no pancreatic mass or inflammation. Spleen: Taking into account limitation by non contrast imaging, no mass or enlargement. Adrenals/Urinary Tract: Chronic right ureteral pelvic junction obstruction. No renal or ureteral obstructing stone. Taking into account limitation by non contrast imaging, no renal or adrenal mass. Partially contracted urinary bladder without gross abnormality. Stomach/Bowel: Prominent colonic diverticula most notable descending colon and sigmoid colon. Significant sigmoid colon muscular hypertrophy. No extraluminal bowel inflammatory process, free fluid or free air. Limited for evaluating for colonic mass given the muscular hypertrophy and under distension. No inflammation surrounds the appendix. No stomach or small bowel abnormality noted. Vascular/Lymphatic: Atherosclerotic changes aorta without aneurysm. Atherosclerotic changes iliac arteries and femoral arteries. 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 No adenopathy. Reproductive: Post hysterectomy. Small amount of gas in the vaginal vault which is immediately adjacent to noninflamed appearing sigmoid diverticula. No fistula tract identified. Other: No bowel containing hernia. Stable small lipoma of lower right transversalis/to external oblique region. Musculoskeletal: L5-S1 disc degeneration, disc space narrowing and osteophyte. IMPRESSION: Prominent colonic diverticula most  notable sigmoid colon with significant muscular hypertrophy. No extraluminal bowel inflammatory process, free fluid or free air. Limited for evaluating for colonic mass given the muscular hypertrophy and under distension. No inflammation surrounds the appendix. Chronic right ureteral pelvic junction obstruction. No renal or ureteral obstructing stone. Post hysterectomy. Small amount of gas in the vaginal vault which is immediately adjacent to noninflamed appearing sigmoid diverticula. No fistula tract identified. L5-S1 moderate disc degeneration. Electronically Signed   By: Genia Del M.D.   On: 02/04/2017 15:31   Ct L-spine No Charge  Result Date: 02/04/2017 CLINICAL DATA:  Low back pain EXAM: CT LUMBAR SPINE WITHOUT CONTRAST TECHNIQUE: Multidetector CT imaging of the lumbar spine was performed without intravenous contrast administration. Multiplanar CT image reconstructions were also generated. COMPARISON:  Lumbar radiographs 09/25/2015 FINDINGS: Segmentation: Normal Alignment: Normal Vertebrae: Negative for fracture or mass lesion. Paraspinal and other soft tissues: Extrarenal pelvis bilaterally. No retroperitoneal mass Disc levels: L1-2:  Mild disc degeneration L2-3:  Negative L3-4:  Mild disc bulging and mild facet degeneration L4-5:  Mild disc bulging and mild facet degeneration L5-S1: Moderate disc degeneration with disc space narrowing and diffuse endplate spurring. Mild facet degeneration. No significant stenosis. IMPRESSION: Negative for fracture Mild degenerative change at L3-4 L4-5. Moderate chronic degenerative change L5-S1. Electronically Signed   By: Franchot Gallo M.D.   On: 02/04/2017 15:11      Assessment and Plan: 66 y.o. female with  Abdominal pain. Patient has diverticula think it's reasonable to proceed with further potential colonoscopy. Refer back to gastroenterology where she's been previously. I think it is also reasonable to restart Linzess for presumed IBS constipation  dominant type.      Orders Placed This Encounter  Procedures  . Ambulatory referral to Gastroenterology    Referral Priority:   Routine    Referral Type:   Consultation    Referral Reason:   Specialty Services Required    Referred to Provider:   Milus Banister, MD    Number of Visits Requested:   1   Meds ordered this encounter  Medications  . linaclotide (LINZESS) 145 MCG CAPS  capsule    Sig: Take 1 capsule (145 mcg total) by mouth daily.    Dispense:  90 capsule    Refill:  3     Discussed warning signs or symptoms. Please see discharge instructions. Patient expresses understanding.

## 2017-02-07 NOTE — Telephone Encounter (Signed)
Pt had a CT scan completed on 6/25.  She was told she needs to f/u with GI for splenic artery dilation.  She has been given an appt for 02/12/17 with Tye Savoy.  Pt was advised if she experiences worsening pain or new symptoms to go to the ED for evaluation.  I did consult with Sheri RN on the CT scan results and confirmed Tue 7/3 with Nevin Bloodgood is ok.

## 2017-02-12 ENCOUNTER — Encounter: Payer: Self-pay | Admitting: Nurse Practitioner

## 2017-02-12 ENCOUNTER — Ambulatory Visit (INDEPENDENT_AMBULATORY_CARE_PROVIDER_SITE_OTHER): Payer: Medicaid Other | Admitting: Nurse Practitioner

## 2017-02-12 VITALS — BP 104/78 | HR 100 | Ht 64.25 in | Wt 148.1 lb

## 2017-02-12 DIAGNOSIS — R1084 Generalized abdominal pain: Secondary | ICD-10-CM

## 2017-02-12 DIAGNOSIS — K5790 Diverticulosis of intestine, part unspecified, without perforation or abscess without bleeding: Secondary | ICD-10-CM | POA: Diagnosis not present

## 2017-02-12 DIAGNOSIS — R1012 Left upper quadrant pain: Secondary | ICD-10-CM

## 2017-02-12 NOTE — Progress Notes (Signed)
HPI:  Patient is a 66 year old female known to Dr. Ardis Hughs. She has a history of diverticulitis and chronic constipation.  2015. She is overdue for colon cancer screening, apparently cancelled last couple of colonoscopies.   In late May after eating several strawberries patient developed lower abdominal discomfort and increased gas. Symptoms were reminiscent of diverticulitis. PCP prescribed 7 days of Augmentin but pain didn't improve after 2 days so she stopped the antibiotics. She called PCP, requested Doxycycline but PCP didn't think it would help, advised office follow up. Patient refused office visit, called back on 6/11 and wanted labs drawn. She did agree to appt and was seen on 01/23/17. CMET and CBC were unremarkable. Prescribed Cipro / flagyl. Several days later she presented to UC when the burning pain in abdomen became more generalized involving thighs and also part of her lower spine.  CRP was normal. [place] spotted fever labs negative. B12 208.  For paresthesia she was offered prednisone / NSAIDS but declined. Patient then went to the ED (same day).   In ED a non contrast CT scan of abd was done and revealed.  significant muscular hypertrophy of the sigmoid colon from diverticular disease, small amount of gas in the vaginal vault immediately adjacent to a noninflamed appearing sigmoid diverticula, chronic right ureteral pelvic junction obstruction. No renal or ureteral obstructing stones seen. L5-S1 disc degeneration, disc space narrowing and osteophyte  Patient has a lot of questions about a colon mass described by PCP. We went over the CT scan together and I explained that a colon mass may not have been seen if one was present because of the muscular hypertrophy. There was no suggestion of mass but rather a disclaimer that study wasn't reliable for detection of mass in the area. She had questions about the splenic artery aneurysm as well.   She continues to have burning in  both thighs, abdomen and up her spine. Her legs feel weak. No known back problems. She has bilateral heel pain which PCP has addressed. No loss of bowel or bladder function. She has chronic constipation but stopped Linzess because of the bowel obstruction / mass on CTscan.   Patient complains of frequent non-radiating epigastric burning / LUQ discomfort unrelated to eating. B12 replacement helped some of the burning in her several areas of her body but the upper abdominal burning persists. She is on a daily PPI.   Past Medical History:  Diagnosis Date  . Anxiety   . Depression   . Diverticulitis   . Diverticulosis   . Headache   . Hyperlipidemia   . IBS (irritable bowel syndrome)   . Lung mass   . Syncope 03/08/2015    Patient's surgical history, family medical history, social history, medications and allergies were all reviewed in Epic    Physical Exam: BP 104/78 (BP Location: Left Arm, Patient Position: Sitting, Cuff Size: Normal)   Pulse 100   Ht 5' 4.25" (1.632 m) Comment: height measured without shoes  Wt 148 lb 2 oz (67.2 kg)   BMI 25.23 kg/m   GENERAL: well developed white female in NAD PSYCH: :assertive EENT:  conjunctiva pink, mucous membranes moist, neck supple without masses CARDIAC:  RRR, no peripheral edema PULM: Normal respiratory effort, lungs CTA bilaterally, no wheezing ABDOMEN:  soft, nontender, nondistended, no obvious masses, no hepatomegaly,  normal bowel sounds SKIN:  turgor, no lesions seen Musculoskeletal:  Normal muscle tone, mildly diminished LLE strength NEURO: Alert and oriented x 3, no  focal neurologic deficits   ASSESSMENT and PLAN:  1. 66 yo female with generalized burning, initially localized to lower abdomen with eventual spread to lower back,  lower extremities. Seen by PCP, urgent care and ED in last few weeks. Non-contrast CTscan unrevealing except for lower disc space narrowing / osteophyte and some chronic right ureteral pelvic junction  obstruction. Labs in ED negative including CRP and labs for tick borne illness. 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 I tried to reassure her about both of these things. She wants a referral to Vascular Surgery. I'm not going to refer her, PCP might choose to.   2. Colon cancer screening. She is due for colonoscopy. Patient scheduled previously but apparently cancelled twice.  -Patient will be scheduled for a screening colonoscopy with possible polypectomy.  The risks and benefits of the procedure were discussed and the patient agrees to proceed.    3. Epigastric / LUQ burning despite daily PPI. No NSAID use. She is concerned about lack of answers despite labs, CT scans and several recent visits to PCP, UC, and also ED. Wants answers. She would like EGD done along with her colonoscopy. It is not unreasonable to proceed with EGD though I think it it will be low yield.  -The risks and benefits of EGD were discussed and the patient agrees to proceed.     ADDENDUM:  Patient wants endoscopic workup done within a week. We are unable to accommodate her, especially since these are non-emergent procedures. We offered to place her on our cancellation list. She declined and said she would go to [facility] instead.   Tye Savoy , NP 02/12/2017, 2:22 PM

## 2017-02-12 NOTE — Patient Instructions (Addendum)
If you are age 64 or older, your body mass index should be between 23-30. Your Body mass index is 25.23 kg/m. If this is out of the aforementioned range listed, please consider follow up with your Primary Care Provider.  If you are age 11 or younger, your body mass index should be between 19-25. Your Body mass index is 25.23 kg/m. If this is out of the aformentioned range listed, please consider follow up with your Primary Care Provider.    Patient needs Endo/Colon and wants it done within a week; we cannot accommodate within one week. Offered to put her on a wait list but she states that [facility] can see her sooner and request that her records be sent.  Address to medical records given to patient:  [address].   Thank you for choosing me and [facility].   Tye Savoy, NP

## 2017-02-14 ENCOUNTER — Telehealth: Payer: Self-pay

## 2017-02-14 DIAGNOSIS — I728 Aneurysm of other specified arteries: Secondary | ICD-10-CM

## 2017-02-14 NOTE — Telephone Encounter (Signed)
Pt was recently seen by gastro who told her that the issue is caused by her spleen. Pt is in need of a referral to a vascular provider. Please assist.

## 2017-02-15 ENCOUNTER — Telehealth: Payer: Self-pay | Admitting: Gastroenterology

## 2017-02-15 NOTE — Telephone Encounter (Signed)
I don't see any comments on that in the gastroenterology note. A referral to this time to a vascular provider does not make any sense. I will let the gastroenterologist finish the note and we will recheck this issue in one week.

## 2017-02-15 NOTE — Telephone Encounter (Signed)
Dr. Madilyn Fireman, Pt called. She''s hurting, sick and weak and  is insistent on seeing a Vascular Doc. She wants you to call her today at [phone] Thank you.

## 2017-02-15 NOTE — Telephone Encounter (Signed)
Pt calling upset that PCP will not refer her to vascular MD because he did not see a need or find a problem with referring her. PCP mentions no referral mentioned in GI note. Jamie [name] please advise.

## 2017-02-16 NOTE — Telephone Encounter (Signed)
I reviewed all the notes.  Her spleen is perfectly fine and the artery to the spleen is a little large but it has nothing to do with her pain or problems and only needs to be monitored over the years for growth.  NO where in the GI note does it say that her pain is caused by her spleen.  I would recommend she get the colonoscopy done so they can look inside the colon and make sure everything is ok.

## 2017-02-18 ENCOUNTER — Encounter: Payer: Self-pay | Admitting: Family Medicine

## 2017-02-18 ENCOUNTER — Ambulatory Visit (INDEPENDENT_AMBULATORY_CARE_PROVIDER_SITE_OTHER): Payer: Medicaid Other | Admitting: Family Medicine

## 2017-02-18 VITALS — BP 121/66 | HR 89 | Wt 149.0 lb

## 2017-02-18 DIAGNOSIS — R319 Hematuria, unspecified: Secondary | ICD-10-CM | POA: Diagnosis not present

## 2017-02-18 DIAGNOSIS — I728 Aneurysm of other specified arteries: Secondary | ICD-10-CM | POA: Diagnosis not present

## 2017-02-18 DIAGNOSIS — N949 Unspecified condition associated with female genital organs and menstrual cycle: Secondary | ICD-10-CM | POA: Diagnosis not present

## 2017-02-18 DIAGNOSIS — K639 Disease of intestine, unspecified: Secondary | ICD-10-CM | POA: Diagnosis not present

## 2017-02-18 DIAGNOSIS — R350 Frequency of micturition: Secondary | ICD-10-CM

## 2017-02-18 DIAGNOSIS — R1012 Left upper quadrant pain: Secondary | ICD-10-CM | POA: Diagnosis not present

## 2017-02-18 LAB — POCT URINALYSIS DIPSTICK
Bilirubin, UA: NEGATIVE
GLUCOSE UA: NEGATIVE
Ketones, UA: NEGATIVE
NITRITE UA: NEGATIVE
PROTEIN UA: NEGATIVE
SPEC GRAV UA: 1.02 (ref 1.010–1.025)
UROBILINOGEN UA: 0.2 U/dL
pH, UA: 5 (ref 5.0–8.0)

## 2017-02-18 NOTE — Telephone Encounter (Signed)
She did not mention a specific place orprovider.

## 2017-02-18 NOTE — Progress Notes (Signed)
I agree with the above note, plan 

## 2017-02-18 NOTE — Telephone Encounter (Signed)
Patient notified. She had an appointment with Dr. Madilyn Fireman today. FYI she still wants referral to vascular doctor.

## 2017-02-18 NOTE — Telephone Encounter (Signed)
OK, I will fax over referral. Entered. Does she have a peferenc for where she wants to go?

## 2017-02-18 NOTE — Telephone Encounter (Signed)
This was discussed w/pt at [name].Jamie [name] [name]

## 2017-02-18 NOTE — Progress Notes (Signed)
Subjective:    Patient ID: Jamie [name], female    DOB: 07-06-1951, 66 y.o.   MRN: [id number]  HPI   66 year old female comes in today to discuss her recent results. She's been having persistent bilateral abdominal pain is worse in the left upper quadrant. She describes it as a burning sensation. At one point said that the pain went all over her entire body. She has actually Artie seeing GI in the neck she saw Dr. Georgina Snell last week. But she had some questions. She was very concerned that there was something going on in her spleen.   She says she was also concerned that she was told that she had an 8 mm mass. She still currently having some significant left upper quadrant pain. She says still bilateral but worse in the left upper quadrant.  She also reports that she's had a little bit more urinary frequency more recently. She says this feels like a pressure sensation like something is going to come out of her vagina. She had a hysterectomy.    Review of Systems  BP 121/66   Pulse 89   Wt 149 lb (67.6 kg)   SpO2 99%   BMI 25.38 kg/m     Allergies  Allergen Reactions  . Ciprofloxacin     Facial numbness  . Iodinated Diagnostic Agents Other (See Comments)    Face and chest felt "hot", throat felt tight.   . Peanut Butter Flavor Nausea And Vomiting    N/V  . Shellfish Allergy Swelling    Past Medical History:  Diagnosis Date  . Anxiety   . Depression   . Diverticulitis   . Diverticulosis   . Headache   . Hyperlipidemia   . IBS (irritable bowel syndrome)   . Lung mass   . Syncope 03/08/2015    Past Surgical History:  Procedure Laterality Date  . ABDOMINAL HYSTERECTOMY    . HEMORRHOID SURGERY    . TUBAL LIGATION    . TUMOR REMOVAL     Left thigh     Social History   Social History  . Marital status: Divorced    Spouse name: N/A  . Number of children: 3  . Years of education: N/A   Occupational History  . Student    Social History Main Topics  . Smoking  status: Former Smoker    Packs/day: 0.80    Years: 20.00    Types: Cigarettes    Quit date: 08/13/1985  . Smokeless tobacco: Never Used  . Alcohol use Yes     Comment: rare  . Drug use: No  . Sexual activity: Not on file   Other Topics Concern  . Not on file   Social History Narrative   Lives with and grandchild (she is foster parent for grand child) Completed school full time.   Caffeine use; occasional   Works in Personal assistant, Economist.   In process of divorce.            Family History  Problem Relation Age of Onset  . Cancer Mother        renal cell carcinoma  . Diabetes Mother   . Heart disease Father        first MI in 69s  . Cancer Sister        cervical cancer  . Heart disease Brother        first MI in 60s  . Alcohol abuse Sister   . Fibromyalgia Sister   .  Arthritis Sister        osteoarthritis  . Irritable bowel syndrome Sister   . Leukemia Unknown        grandson    Outpatient Encounter Prescriptions as of 02/18/2017  Medication Sig  . ALPRAZolam (XANAX) 0.5 MG tablet Take 1 tablet (0.5 mg total) by mouth daily as needed. for anxiety  . diphenhydrAMINE (BENADRYL) 25 MG tablet Take 1 tablet (25 mg total) by mouth every 6 (six) hours.  . gabapentin (NEURONTIN) 300 MG capsule Take 1 capsule (300 mg total) by mouth at bedtime.  Marland Kitchen linaclotide (LINZESS) 145 MCG CAPS capsule Take 1 capsule (145 mcg total) by mouth daily.  Marland Kitchen NEXIUM 40 MG capsule TAKE 1 CAPSULE(40 MG) BY MOUTH DAILY AS NEEDED  . [DISCONTINUED] famotidine (PEPCID) 20 MG tablet Take 1 tablet (20 mg total) by mouth 2 (two) times daily.   No facility-administered encounter medications on file as of 02/18/2017.          Objective:   Physical Exam  Constitutional: She is oriented to person, place, and time. She appears well-developed and well-nourished.  HENT:  Head: Normocephalic and atraumatic.  Eyes: Conjunctivae and EOM are normal.  Cardiovascular: Normal rate.    Pulmonary/Chest: Effort normal.  Neurological: She is alert and oriented to person, place, and time.  Skin: Skin is dry. No pallor.  Psychiatric: She has a normal mood and affect. Her behavior is normal.  Vitals reviewed.         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 I think there is just a misunderstanding of words. I asked Jamie [name] her that what an aneurysm is. Hers is currently 8 mm. She would prefer a vascular consult.  Likely they will just monitor the aneurysm over time. I really think this has absolutely nothing to do with her pain and tried to explain that to her. This would not cause bilateral abdominal pain and it would certainly not cause all over diffuse body pain. I think this is just more incidental but does need to be monitored.  Colonic hypertrophy-again I printed a copy of the report and gave it to her and discussed with her that she does not have a mass. That she's had some hypertrophy of the sigmoid colon that made it difficult to see if there was may be a mass underneath. That doesn't mean that they actually saw anything worrisome. Thus the next step is to really have a colonoscopy for further evaluation. She has seen GI but they were not able to schedule her scope until September. That she had asked to have an upper and lower done at the same time. We may be able to call and if they're willing to just do the colonoscopy we may be able to get her in sooner.  Urinary frequecny - will check urinalysis. We'll send for culture for confirmation. She also feels like she feels like something is dropping out of her vaginal area so will make referral to our OB/GYN down the hall to see if she may have prolapse.

## 2017-02-19 ENCOUNTER — Telehealth: Payer: Self-pay | Admitting: Gastroenterology

## 2017-02-19 LAB — URINE CULTURE

## 2017-02-19 NOTE — Telephone Encounter (Signed)
Jamie [name] per her last office visit note with Nevin Bloodgood   "Patient wants endoscopic workup done within a week. We are unable to accommodate her, especially since these are non-emergent procedures. We offered to place her on our cancellation list. She declined and said she would go to [facility] instead. "    Did she transfer to [place]?

## 2017-02-19 NOTE — Telephone Encounter (Signed)
Ok she can be scheduled for endo colon directly with Dr Ardis Hughs next available.

## 2017-02-19 NOTE — Telephone Encounter (Signed)
I'm not sure it even needs evaluating, her PCP would know more about the significance of the finding. I don't think it has anyway to do with her current symptoms.

## 2017-02-19 NOTE — Telephone Encounter (Signed)
LM on Vmail to call back to schedule °

## 2017-02-19 NOTE — Telephone Encounter (Signed)
Per [name] at [facility] family. Patient did not transfer to [facility]

## 2017-02-20 NOTE — Progress Notes (Signed)
This encounter was created in error - please disregard.

## 2017-02-20 NOTE — Telephone Encounter (Signed)
Pt saw PCP 02/18/17.

## 2017-02-21 ENCOUNTER — Encounter: Payer: Self-pay | Admitting: Gastroenterology

## 2017-02-21 NOTE — Telephone Encounter (Signed)
Error

## 2017-02-22 ENCOUNTER — Other Ambulatory Visit: Payer: Self-pay | Admitting: Physician Assistant

## 2017-03-05 ENCOUNTER — Ambulatory Visit (AMBULATORY_SURGERY_CENTER): Payer: Medicaid Other

## 2017-03-05 VITALS — Ht 64.0 in | Wt 149.0 lb

## 2017-03-05 DIAGNOSIS — K5720 Diverticulitis of large intestine with perforation and abscess without bleeding: Secondary | ICD-10-CM

## 2017-03-05 DIAGNOSIS — R1012 Left upper quadrant pain: Secondary | ICD-10-CM

## 2017-03-05 MED ORDER — SUPREP BOWEL PREP KIT 17.5-3.13-1.6 GM/177ML PO SOLN: 1.0000 | Freq: Once | ORAL | 0 refills | Status: AC

## 2017-03-05 NOTE — Progress Notes (Signed)
No allergies to eggs or soy No diet meds No home oxygen No past problems with anesthesia except at the dentist  Registered emmi

## 2017-03-18 ENCOUNTER — Encounter: Payer: Self-pay | Admitting: Family Medicine

## 2017-03-18 ENCOUNTER — Ambulatory Visit (INDEPENDENT_AMBULATORY_CARE_PROVIDER_SITE_OTHER): Payer: Medicaid Other | Admitting: Family Medicine

## 2017-03-18 VITALS — BP 116/58 | HR 87 | Wt 151.0 lb

## 2017-03-18 DIAGNOSIS — R208 Other disturbances of skin sensation: Secondary | ICD-10-CM

## 2017-03-18 DIAGNOSIS — R300 Dysuria: Secondary | ICD-10-CM

## 2017-03-18 DIAGNOSIS — I728 Aneurysm of other specified arteries: Secondary | ICD-10-CM | POA: Diagnosis not present

## 2017-03-18 LAB — POCT URINALYSIS DIPSTICK
Bilirubin, UA: NEGATIVE
Blood, UA: NEGATIVE
GLUCOSE UA: NEGATIVE
Ketones, UA: NEGATIVE
LEUKOCYTES UA: NEGATIVE
Nitrite, UA: NEGATIVE
PROTEIN UA: NEGATIVE
SPEC GRAV UA: 1.02 (ref 1.010–1.025)
UROBILINOGEN UA: 0.2 U/dL
pH, UA: 5.5 (ref 5.0–8.0)

## 2017-03-18 NOTE — Progress Notes (Signed)
Subjective:    Patient ID: Jamie [name], female    DOB: [birth year], 66 y.o.   MRN: [id number]  HPI Jamie [name] is a 66 year old female who comes in today to go over recent results. She actually had a abdomen pelvis CT done particularly of the vascular system by the vascular specialist at [facility]. She did have some questions. Evidently he is out of town. This was further evaluation of a splenic artery aneurysm. She did note that not that long ago she had actually fallen off of a ladder and actually hit her abdomen (approx end of May). She wonders if that could have actually caused some of the trauma. She also reports that when she received the contrast for the imaging study she got an instant burning sensation in her chest and it made her heart race. They actually had to keep her for well and monitor before her before they let her go home. She says it really took a couple days before she felt more like herself and felt like her heart rate was back to normal.  She also continues to complain of 6 weeks of burning sensation in both sides of the abdomen. It seems to be coming and going go she always seems to have at least some level of discomfort.She is scheduled for her colonoscopy next week for further workup.    He is also had some dysuria on and off. No hematuria. No fevers chills or sweats.   Review of Systems  BP (!) 116/58   Pulse 87   Wt 151 lb (68.5 kg)   BMI 25.92 kg/m     Allergies  Allergen Reactions  . Ciprofloxacin     Facial numbness  . Iodinated Diagnostic Agents Other (See Comments)    Face and chest felt "hot", throat felt tight.   . Peanut Butter Flavor Nausea And Vomiting    N/V  . Shellfish Allergy Swelling    Past Medical History:  Diagnosis Date  . Anxiety   . Depression   . Diverticulitis   . Diverticulosis   . Headache   . Hyperlipidemia   . IBS (irritable bowel syndrome)   . Lung mass   . Syncope 03/08/2015    Past Surgical History:  Procedure  Laterality Date  . ABDOMINAL HYSTERECTOMY    . HEMORRHOID SURGERY    . TUBAL LIGATION    . TUMOR REMOVAL     Left thigh     Social History   Social History  . Marital status: Divorced    Spouse name: N/A  . Number of children: 3  . Years of education: N/A   Occupational History  . Student    Social History Main Topics  . Smoking status: Former Smoker    Packs/day: 0.80    Years: 20.00    Types: Cigarettes    Quit date: 08/13/1985  . Smokeless tobacco: Never Used  . Alcohol use Yes     Comment: rare  . Drug use: No  . Sexual activity: Not on file   Other Topics Concern  . Not on file   Social History Narrative   Lives with and grandchild (she is foster parent for grand child) Completed school full time.   Caffeine use; occasional   Works in Personal assistant, Economist.   In process of divorce.            Family History  Problem Relation Age of Onset  . Cancer Mother  renal cell carcinoma  . Diabetes Mother   . Heart disease Father        first MI in 20s  . Cancer Sister        cervical cancer  . Heart disease Brother        first MI in 27s  . Alcohol abuse Sister   . Fibromyalgia Sister   . Arthritis Sister        osteoarthritis  . Irritable bowel syndrome Sister   . Leukemia Unknown        grandson    Outpatient Encounter Prescriptions as of 03/18/2017  Medication Sig  . ALPRAZolam (XANAX) 0.5 MG tablet Take 1 tablet (0.5 mg total) by mouth daily as needed. for anxiety  . diphenhydrAMINE (BENADRYL) 25 MG tablet Take 1 tablet (25 mg total) by mouth every 6 (six) hours.  . gabapentin (NEURONTIN) 300 MG capsule Take 1 capsule (300 mg total) by mouth at bedtime.  . [DISCONTINUED] ESTRACE VAGINAL 0.1 MG/GM vaginal cream INSERT 1 APPLICATORFUL VAGINALLY 3 TIMES A WEEK (Patient not taking: Reported on 03/05/2017)  . [DISCONTINUED] linaclotide (LINZESS) 145 MCG CAPS capsule Take 1 capsule (145 mcg total) by mouth daily.   No  facility-administered encounter medications on file as of 03/18/2017.          Objective:   Physical Exam  Constitutional: She is oriented to person, place, and time. She appears well-developed and well-nourished.  HENT:  Head: Normocephalic and atraumatic.  Eyes: Conjunctivae and EOM are normal.  Cardiovascular: Normal rate.   Pulmonary/Chest: Effort normal.  Neurological: She is alert and oriented to person, place, and time.  Skin: Skin is dry. No pallor.  Psychiatric: She has a normal mood and affect. Her behavior is normal.  Vitals reviewed.       Assessment & Plan:  Splenic artery Aneurysm-CT scan confirmed that the aneurysm is still approximately 8 mm in the there are no additional aneurysms found. Reviewed these results with her and gave her reassurance. Right now the treatment plan will be to repeat scan in one year and then if stable at that point we'll repeat every 2-3 years for monitoring. To her that I'm not really sure what may have caused this. She did ask for a diagram of where exactly the splenic artery is located sided provide those pictures for her. Did encourage her to think of her questions and write them down and take them to her appointment to review when she goes back to see the specialist.  Abdominal burning sensation-she is scheduled for colonoscopy later this week to hopefully that can help rule some things out.  Dysuria-urinalysis performed. Dipstick negative. We'll send for culture for confirmation. Call with results once available.

## 2017-03-19 ENCOUNTER — Encounter: Payer: Self-pay | Admitting: Gastroenterology

## 2017-03-20 ENCOUNTER — Ambulatory Visit (AMBULATORY_SURGERY_CENTER): Payer: Medicaid Other | Admitting: Gastroenterology

## 2017-03-20 ENCOUNTER — Encounter: Payer: Self-pay | Admitting: Gastroenterology

## 2017-03-20 VITALS — BP 112/67 | HR 86 | Temp 98.6°F | Resp 14 | Ht 64.0 in | Wt 148.0 lb

## 2017-03-20 DIAGNOSIS — R1012 Left upper quadrant pain: Secondary | ICD-10-CM | POA: Diagnosis not present

## 2017-03-20 DIAGNOSIS — K2970 Gastritis, unspecified, without bleeding: Secondary | ICD-10-CM | POA: Diagnosis not present

## 2017-03-20 DIAGNOSIS — R1084 Generalized abdominal pain: Secondary | ICD-10-CM

## 2017-03-20 DIAGNOSIS — R935 Abnormal findings on diagnostic imaging of other abdominal regions, including retroperitoneum: Secondary | ICD-10-CM | POA: Diagnosis not present

## 2017-03-20 DIAGNOSIS — K2990 Gastroduodenitis, unspecified, without bleeding: Secondary | ICD-10-CM

## 2017-03-20 MED ORDER — SODIUM CHLORIDE 0.9 % IV SOLN: 500.0000 mL | INTRAVENOUS | Status: DC

## 2017-03-20 NOTE — Progress Notes (Signed)
Pt's states no medical or surgical changes since previsit or office visit. 

## 2017-03-20 NOTE — Op Note (Signed)
[facility] Patient Name: Jamie [name] Procedure Date: 03/20/2017 3:00 PM MRN: [id number] Endoscopist: Milus Banister , MD Age: 66 Referring MD:  Date of Birth: Oct 20, 1950 Gender: Female Account #: [id number] Procedure:                Upper GI endoscopy Indications:              Generalized abdominal pain Medicines:                Monitored Anesthesia Care Procedure:                Pre-Anesthesia Assessment:                           - Prior to the procedure, a History and Physical                            was performed, and patient medications and                            allergies were reviewed. The patient's tolerance of                            previous anesthesia was also reviewed. The risks                            and benefits of the procedure and the sedation                            options and risks were discussed with the patient.                            All questions were answered, and informed consent                            was obtained. Prior Anticoagulants: The patient has                            taken no previous anticoagulant or antiplatelet                            agents. ASA Grade Assessment: II - A patient with                            mild systemic disease. After reviewing the risks                            and benefits, the patient was deemed in                            satisfactory condition to undergo the procedure.                           After obtaining informed consent, the endoscope was  passed under direct vision. Throughout the                            procedure, the patient's blood pressure, pulse, and                            oxygen saturations were monitored continuously. The                            Endoscope was introduced through the mouth, and                            advanced to the second part of duodenum. The upper                            GI endoscopy was accomplished  without difficulty.                            The patient tolerated the procedure well. Scope In: Scope Out: Findings:                 The esophagus was normal.                           Mild inflammation characterized by erythema and                            granularity was found in the gastric antrum.                            Biopsies were taken with a cold forceps for                            histology.                           The examined duodenum was normal. Complications:            No immediate complications. Estimated blood loss:                            None. Estimated Blood Loss:     Estimated blood loss: none. Impression:               - Normal esophagus.                           - Gastritis. Biopsied.                           - Normal examined duodenum. Recommendation:           - Patient has a contact number available for                            emergencies. The signs and symptoms of potential  delayed complications were discussed with the                            patient. Return to normal activities tomorrow.                            Written discharge instructions were provided to the                            patient.                           - Resume previous diet.                           - Continue present medications.                           - Await pathology results. After reviewing the                            pathology and also your recent office visit, labs,                            imaging (since the hospital EPIC computer medical                            record system was down) Dr. Ardis Hughs will advise on                            any further testing, treatments. Milus Banister, MD 03/20/2017 3:27:05 PM This report has been signed electronically.

## 2017-03-20 NOTE — Op Note (Signed)
[facility] Patient Name: Jamie [name] Procedure Date: 03/20/2017 3:00 PM MRN: [id number] Endoscopist: Milus Banister , MD Age: 66 Referring MD:  Date of Birth: 1951-07-31 Gender: Female Account #: [id number] Procedure:                Colonoscopy Indications:              Generalized abdominal pain, Abnormal CT of the GI                            tract Medicines:                Monitored Anesthesia Care Procedure:                Pre-Anesthesia Assessment:                           - Prior to the procedure, a History and Physical                            was performed, and patient medications and                            allergies were reviewed. The patient's tolerance of                            previous anesthesia was also reviewed. The risks                            and benefits of the procedure and the sedation                            options and risks were discussed with the patient.                            All questions were answered, and informed consent                            was obtained. Prior Anticoagulants: The patient has                            taken no previous anticoagulant or antiplatelet                            agents. ASA Grade Assessment: II - A patient with                            mild systemic disease. After reviewing the risks                            and benefits, the patient was deemed in                            satisfactory condition to undergo the procedure.  After obtaining informed consent, the colonoscope                            was passed under direct vision. Throughout the                            procedure, the patient's blood pressure, pulse, and                            oxygen saturations were monitored continuously. The                            Colonoscope was introduced through the anus and                            advanced to the the terminal ileum. The colonoscopy                       was performed without difficulty. The patient                            tolerated the procedure well. The quality of the                            bowel preparation was good. The terminal ileum,                            ileocecal valve, appendiceal orifice, and rectum                            were photographed. Scope In: 3:10:46 PM Scope Out: 3:17:22 PM Scope Withdrawal Time: 0 hours 4 minutes 49 seconds  Total Procedure Duration: 0 hours 6 minutes 36 seconds  Findings:                 Multiple small and large-mouthed diverticula were                            found in the left colon, this was associated with                            erythema, mucosal thickening and slight tortuosity                            of the colon lumen.                           The exam was otherwise without abnormality on                            direct and retroflexion views. Complications:            No immediate complications. Estimated blood loss:                            None. Estimated Blood Loss:  Estimated blood loss: none. Impression:               - Moderate diverticulosis in the left colon.                           - The examination was otherwise normal on direct                            and retroflexion views.                           - No specimens collected. Recommendation:           - Patient has a contact number available for                            emergencies. The signs and symptoms of potential                            delayed complications were discussed with the                            patient. Return to normal activities tomorrow.                            Written discharge instructions were provided to the                            patient.                           - Resume previous diet.                           - Continue present medications.                           - Repeat colonoscopy in 10 years for screening                             purposes.                           - EGD now. Milus Banister, MD 03/20/2017 3:20:36 PM This report has been signed electronically.

## 2017-03-21 ENCOUNTER — Telehealth: Payer: Self-pay

## 2017-03-21 DIAGNOSIS — K2950 Unspecified chronic gastritis without bleeding: Secondary | ICD-10-CM | POA: Diagnosis not present

## 2017-03-21 NOTE — Telephone Encounter (Signed)
The new pain is mild, she is not tender to self palpation. Eating last night and this morning.  No bleeding. +passing gas. Possibly some retained gas which is yet to be expelled.  I doubt perforation but she knows to call back if that new pain worsens throughout the day. Of note, she has a lot of chronic abdominal symptoms.  She's frustrated that the biopsy results from yesterday are not back yet.  I explained the process and she seems to understand it will be another couple days.

## 2017-03-21 NOTE — Patient Instructions (Signed)
YOU HAD AN ENDOSCOPIC PROCEDURE TODAY: Refer to the procedure report and other information in the discharge instructions given to you for any specific questions about what was found during the examination. If this information does not answer your questions, please call [place] office at [phone] to clarify.  ° °YOU SHOULD EXPECT: Some feelings of bloating in the abdomen. Passage of more gas than usual. Walking can help get rid of the air that was put into your GI tract during the procedure and reduce the bloating. If you had a lower endoscopy (such as a colonoscopy or flexible sigmoidoscopy) you may notice spotting of blood in your stool or on the toilet paper. Some abdominal soreness may be present for a day or two, also. ° °DIET: Your first meal following the procedure should be a light meal and then it is ok to progress to your normal diet. A half-sandwich or bowl of soup is an example of a good first meal. Heavy or fried foods are harder to digest and may make you feel nauseous or bloated. Drink plenty of fluids but you should avoid alcoholic beverages for 24 hours. If you had a esophageal dilation, please see attached instructions for diet.   ° °ACTIVITY: Your care partner should take you home directly after the procedure. You should plan to take it easy, moving slowly for the rest of the day. You can resume normal activity the day after the procedure however YOU SHOULD NOT DRIVE, use power tools, machinery or perform tasks that involve climbing or major physical exertion for 24 hours (because of the sedation medicines used during the test).  ° °SYMPTOMS TO REPORT IMMEDIATELY: °A gastroenterologist can be reached at any hour. Please call [phone]  for any of the following symptoms:  °Following lower endoscopy (colonoscopy, flexible sigmoidoscopy) °Excessive amounts of blood in the stool  °Significant tenderness, worsening of abdominal pains  °Swelling of the abdomen that is new, acute  °Fever of 100° or  higher  °Following upper endoscopy (EGD, EUS, ERCP, esophageal dilation) °Vomiting of blood or coffee ground material  °New, significant abdominal pain  °New, significant chest pain or pain under the shoulder blades  °Painful or persistently difficult swallowing  °New shortness of breath  °Black, tarry-looking or red, bloody stools ° °FOLLOW UP:  °If any biopsies were taken you will be contacted by phone or by letter within the next 1-3 weeks. Call [phone]  if you have not heard about the biopsies in 3 weeks.  °Please also call with any specific questions about appointments or follow up tests. ° °

## 2017-03-21 NOTE — Progress Notes (Signed)
Pathology put into computer on 03-21-17 at 712 d/t [employer] being down on 03-20-17

## 2017-03-21 NOTE — Telephone Encounter (Signed)
  Follow up Call-  Call back number 03/20/2017  Post procedure Call Back phone  # [phone]  Permission to leave phone message Yes  Some recent data might be hidden     Patient questions:  Do you have a fever, pain , or abdominal swelling? Yes.   Pain Score  5 *  Have you tolerated food without any problems? Yes.    Have you been able to return to your normal activities? No.  Do you have any questions about your discharge instructions: Diet   No. Medications  No. Follow up visit  Yes.    Do you have questions or concerns about your Care? Yes.    Actions: * If pain score is 4 or above: Physician/ provider Notified : Owens Loffler, MD  Follow up call made this morning. Pt complaining of new left upper epigastric pain. Rating pain a 5. Verbalize pain comes and goes. Pt verbalize pain started this morning around 3 am.Pt verbalize on yesterday she was walking around and passing air. Pt anxious to find out about biopsy results. Advised I would notify her Physician.

## 2017-03-22 ENCOUNTER — Encounter: Payer: Self-pay | Admitting: Gastroenterology

## 2017-03-22 DIAGNOSIS — I728 Aneurysm of other specified arteries: Secondary | ICD-10-CM

## 2017-03-22 HISTORY — DX: Aneurysm of other specified arteries: I72.8

## 2017-03-25 ENCOUNTER — Encounter: Payer: Self-pay | Admitting: Vascular Surgery

## 2017-03-26 ENCOUNTER — Encounter: Payer: Self-pay | Admitting: Obstetrics & Gynecology

## 2017-03-26 ENCOUNTER — Ambulatory Visit (INDEPENDENT_AMBULATORY_CARE_PROVIDER_SITE_OTHER): Payer: Medicaid Other | Admitting: Obstetrics & Gynecology

## 2017-03-26 VITALS — BP 109/68 | HR 89 | Wt 149.0 lb

## 2017-03-26 DIAGNOSIS — Z1231 Encounter for screening mammogram for malignant neoplasm of breast: Secondary | ICD-10-CM

## 2017-03-26 DIAGNOSIS — R198 Other specified symptoms and signs involving the digestive system and abdomen: Secondary | ICD-10-CM | POA: Diagnosis not present

## 2017-03-26 NOTE — Progress Notes (Signed)
   Subjective:    Patient ID: Jamie [name], female    DOB: June 12, 1951, 66 y.o.   MRN: [id number]  HPI 66 yo DWP3 here with a several month h/o feeling vaginal and rectal pressure. She reports chronic constipation, just had a colonoscopy. She is abstinent for about 4 years, denies splinting.She reports several years of rare GSUI, does not wear pads. She had a TAH in the distant past.   Review of Systems     Objective:   Physical Exam Well nourished, well hydrated white female, no apparent distress Breathing, conversing, and ambulating normally Appears younger than stated age 17- benign Vulva- vitelligo noted, mild atrophy Vagina - NO prolapse of any kind, excellent support of her vaginal cuff      Assessment & Plan:  Vaginal/rectal pressure- no related to prolapse Suggested that constipation may be a factor Schedule annual mammogram Rec Miralax RTC prn

## 2017-03-26 NOTE — Patient Instructions (Signed)
Laxative- Miralax (over the counter)

## 2017-03-31 ENCOUNTER — Encounter: Payer: Self-pay | Admitting: Gastroenterology

## 2017-04-02 ENCOUNTER — Encounter: Payer: Medicaid Other | Admitting: Vascular Surgery

## 2017-04-03 ENCOUNTER — Telehealth: Payer: Self-pay | Admitting: Gastroenterology

## 2017-04-03 NOTE — Telephone Encounter (Signed)
The pt has been advised that the path has not been reviewed and as soon as we receive and review results we will call.

## 2017-04-08 ENCOUNTER — Ambulatory Visit (INDEPENDENT_AMBULATORY_CARE_PROVIDER_SITE_OTHER): Payer: Medicaid Other | Admitting: Obstetrics & Gynecology

## 2017-04-08 ENCOUNTER — Encounter: Payer: Self-pay | Admitting: Obstetrics & Gynecology

## 2017-04-08 VITALS — BP 120/71 | HR 94 | Resp 16 | Ht 64.0 in | Wt 149.0 lb

## 2017-04-08 DIAGNOSIS — R102 Pelvic and perineal pain: Secondary | ICD-10-CM | POA: Diagnosis not present

## 2017-04-08 NOTE — Progress Notes (Signed)
   Subjective:    Patient ID: Jamie [name], female    DOB: 1951-03-06, 66 y.o.   MRN: [id number]  HPI 66 yo DW P3 here with suprapubic pain. She was seen here last week and described rectal and vaginal pressure. Her exam was normal then. I felt that her pain was not gyn in etiology and that she would benefit from Miralax. She found some metronidazole in her cabinet and has been taking it BID, thinks the pain is somewhat better. She is requesting a gyn u/s and more antibiotics.   Review of Systems Her uterus, tubes and ovaries have been removed.    Objective:   Physical Exam Well nourished, well hydrated white female, no apparent distress Breathing, conversing, and ambulating normally        Assessment & Plan:  Pelvic pain- she points to her bladder I will order an u/s at her request and refer her to a urologist

## 2017-04-12 ENCOUNTER — Ambulatory Visit (INDEPENDENT_AMBULATORY_CARE_PROVIDER_SITE_OTHER): Payer: Medicaid Other

## 2017-04-12 ENCOUNTER — Ambulatory Visit (INDEPENDENT_AMBULATORY_CARE_PROVIDER_SITE_OTHER): Payer: Medicaid Other | Admitting: Physician Assistant

## 2017-04-12 ENCOUNTER — Encounter: Payer: Self-pay | Admitting: Physician Assistant

## 2017-04-12 ENCOUNTER — Telehealth: Payer: Self-pay | Admitting: Gastroenterology

## 2017-04-12 VITALS — BP 115/62 | HR 86 | Wt 149.0 lb

## 2017-04-12 DIAGNOSIS — Z13220 Encounter for screening for lipoid disorders: Secondary | ICD-10-CM | POA: Diagnosis not present

## 2017-04-12 DIAGNOSIS — R002 Palpitations: Secondary | ICD-10-CM

## 2017-04-12 DIAGNOSIS — Z0000 Encounter for general adult medical examination without abnormal findings: Secondary | ICD-10-CM

## 2017-04-12 DIAGNOSIS — Z131 Encounter for screening for diabetes mellitus: Secondary | ICD-10-CM

## 2017-04-12 DIAGNOSIS — K2990 Gastroduodenitis, unspecified, without bleeding: Secondary | ICD-10-CM | POA: Diagnosis not present

## 2017-04-12 DIAGNOSIS — M8580 Other specified disorders of bone density and structure, unspecified site: Secondary | ICD-10-CM

## 2017-04-12 DIAGNOSIS — R102 Pelvic and perineal pain: Secondary | ICD-10-CM

## 2017-04-12 DIAGNOSIS — Q8909 Congenital malformations of spleen: Secondary | ICD-10-CM | POA: Diagnosis not present

## 2017-04-12 MED ORDER — RANITIDINE HCL 150 MG PO CAPS: 150.0000 mg | ORAL_CAPSULE | Freq: Two times a day (BID) | ORAL | 3 refills | Status: DC

## 2017-04-12 MED ORDER — ALPRAZOLAM 0.5 MG PO TABS: 0.5000 mg | ORAL_TABLET | Freq: Every day | ORAL | 5 refills | Status: DC | PRN

## 2017-04-12 NOTE — Telephone Encounter (Signed)
The pt was advised of the path letter and will keep appt for 06/14/17   Dear Ms. Luber,  The biopsies taken during your recent upper endoscopy showed no sign of cancer or infection.  Please call my office to schedule return visit to discuss your symptoms, consider other testing.

## 2017-04-12 NOTE — Progress Notes (Addendum)
Subjective:     Jamie [name] is a 66 y.o. female and is here for a comprehensive physical exam. The patient reports pt is very concerned something is wrong with her. after her olderest sister cancer dx of pancreatic cancer she is scared. she went to ED and had colonoscopy and endoscopy. other than gastritis everything looks great.  . 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 After having CT dye go in fast she felt "like her heart has not been right since". She feels like it is fluttering and not the same. She request to see a cardiologist.   Social History   Social History  . Marital status: Divorced    Spouse name: N/A  . Number of children: 3  . Years of education: N/A   Occupational History  . Student    Social History Main Topics  . Smoking status: Former Smoker    Packs/day: 0.80    Years: 20.00    Types: Cigarettes    Quit date: 08/13/1985  . Smokeless tobacco: Never Used  . Alcohol use Yes     Comment: rare  . Drug use: No  . Sexual activity: Not on file   Other Topics Concern  . Not on file   Social History Narrative   Lives with and grandchild (she is foster parent for grand child) Completed school full time.   Caffeine use; occasional   Works in Personal assistant, Economist.   In process of divorce.           Health Maintenance  Topic Date Due  . Hepatitis C Screening  February 13, 1951  . INFLUENZA VACCINE  04/17/2018 (Originally 03/13/2017)  . TETANUS/TDAP  02/19/2019 (Originally 08/14/2015)  . PAP SMEAR  07/18/2045 (Originally 11/11/2012)  . MAMMOGRAM  05/11/2018  . DEXA SCAN  05/23/2018  . COLONOSCOPY  03/21/2027  . HIV Screening  Completed    The following portions of the patient's history were reviewed and updated as appropriate: allergies, current medications, past family history, past medical history, past social history, past surgical history and problem list.  Review of Systems Pertinent items noted in HPI and remainder of comprehensive ROS  otherwise negative.   Objective:    BP 115/62   Pulse 86   Wt 149 lb (67.6 kg)   BMI 25.58 kg/m  General appearance: alert, cooperative and appears stated age Head: Normocephalic, without obvious abnormality, atraumatic Eyes: conjunctivae/corneas clear. PERRL, EOM's intact. Fundi benign. Ears: normal TM's and external ear canals both ears Nose: Nares normal. Septum midline. Mucosa normal. No drainage or sinus tenderness. Throat: lips, mucosa, and tongue normal; teeth and gums normal Neck: no adenopathy, no carotid bruit, no JVD, supple, symmetrical, trachea midline and thyroid not enlarged, symmetric, no tenderness/mass/nodules Back: symmetric, no curvature. ROM normal. No CVA tenderness. Lungs: clear to auscultation bilaterally Breasts: normal appearance, no masses or tenderness Heart: regular rate and rhythm, S1, S2 normal, no murmur, click, rub or gallop Abdomen: soft, non-tender; bowel sounds normal; no masses,  no organomegaly Extremities: extremities normal, atraumatic, no cyanosis or edema Pulses: 2+ and symmetric Skin: Skin color, texture, turgor normal. No rashes or lesions Lymph nodes: Cervical, supraclavicular, and axillary nodes normal. Neurologic: Alert and oriented X 3, normal strength and tone. Normal symmetric reflexes. Normal coordination and gait    Assessment:    Healthy female exam.      Plan:    Marland KitchenMarland KitchenKarrine was seen today for annual exam.  Diagnoses and all orders for this  visit:  Routine physical examination -     Lipid panel -     COMPLETE METABOLIC PANEL WITH GFR -     Vitamin B12 -     VITAMIN D 25 Hydroxy (Vit-D Deficiency, Fractures)  Osteopenia, unspecified location  Anomaly of spleen  Screening for lipid disorders -     Lipid panel  Screening for diabetes mellitus -     COMPLETE METABOLIC PANEL WITH GFR  Gastritis and duodenitis -     ranitidine (ZANTAC) 150 MG capsule; Take 1 capsule (150 mg total) by mouth 2 (two) times  daily.  Palpitations -     Ambulatory referral to Cardiology  Other orders -     ALPRAZolam (XANAX) 0.5 MG tablet; Take 1 tablet (0.5 mg total) by mouth daily as needed. for anxiety   .Marland Kitchen Depression screen PHQ 2/9 04/12/2017  Decreased Interest 2  Down, Depressed, Hopeless 2  PHQ - 2 Score 4  Altered sleeping 2  Tired, decreased energy 2  Change in appetite 0  Feeling bad or failure about yourself  0  Trouble concentrating 2  Moving slowly or fidgety/restless 2  Suicidal thoughts 0  PHQ-9 Score 12  Some recent data might be hidden   Mammogram ordered.  Pap not indicated.   Discussed gastritis and treatement. Zantac discussed and added.   Marland Kitchen.Discussed low carb diet with 1500 calories and 80g of protein.  Exercising at least 150 minutes a week.  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   Will make referral to cardiology for evaluation.   Encouraged vitamin D 1000 units and Calcium 1300mg  or 4 servings of dairy a day.   See After Visit Summary for Counseling Recommendations

## 2017-04-12 NOTE — Telephone Encounter (Signed)
Patient states that she waited long enough for her results and that her results say to schedule an appt with Dr.Jacobs but she doesn't think that means November 2, which is when she is scheduled. Patient requesting a call back to discuss.

## 2017-04-12 NOTE — Patient Instructions (Addendum)
Start zantac up to twice a day.   Keeping You Healthy  Get These Tests  Blood Pressure- Have your blood pressure checked by your healthcare provider at least once a year.  Normal blood pressure is 120/80.  Weight- Have your body mass index (BMI) calculated to screen for obesity.  BMI is a measure of body fat based on height and weight.  You can calculate your own BMI at [employer].it  Cholesterol- Have your cholesterol checked every year.  Diabetes- Have your blood sugar checked every year if you have high blood pressure, high cholesterol, a family history of diabetes or if you are overweight.  Pap Test - Have a pap test every 1 to 5 years if you have been sexually active.  If you are older than 65 and recent pap tests have been normal you may not need additional pap tests.  In addition, if you have had a hysterectomy  for benign disease additional pap tests are not necessary.  Mammogram-Yearly mammograms are essential for early detection of breast cancer  Screening for Colon Cancer- Colonoscopy starting at age 36. Screening may begin sooner depending on your family history and other health conditions.  Follow up colonoscopy as directed by your Gastroenterologist.  Screening for Osteoporosis- Screening begins at age 104 with bone density scanning, sooner if you are at higher risk for developing Osteoporosis.  Get these medicines  Calcium with Vitamin D- Your body requires 1200-1500 mg of Calcium a day and [phone] IU of Vitamin D a day.  You can only absorb 500 mg of Calcium at a time therefore Calcium must be taken in 2 or 3 separate doses throughout the day.  Hormones- Hormone therapy has been associated with increased risk for certain cancers and heart disease.  Talk to your healthcare provider about if you need relief from menopausal symptoms.  Aspirin- Ask your healthcare provider about taking Aspirin to prevent Heart Disease and Stroke.  Get these Immuniztions  Flu  shot- Every fall  Pneumonia shot- Once after the age of 10; if you are younger ask your healthcare provider if you need a pneumonia shot.  Tetanus- Every ten years.  Zostavax- Once after the age of 55 to prevent shingles.  Take these steps  Don't smoke- Your healthcare provider can help you quit. For tips on how to quit, ask your healthcare provider or go to www.smokefree.gov or call 1-800 QUIT-NOW.  Be physically active- Exercise 5 days a week for a minimum of 30 minutes.  If you are not already physically active, start slow and gradually work up to 30 minutes of moderate physical activity.  Try walking, dancing, bike riding, swimming, etc.  Eat a healthy diet- Eat a variety of healthy foods such as fruits, vegetables, whole grains, low fat milk, low fat cheeses, yogurt, lean meats, chicken, fish, eggs, dried beans, tofu, etc.  For more information go to www.thenutritionsource.org  Dental visit- Brush and floss teeth twice daily; visit your dentist twice a year.  Eye exam- Visit your Optometrist or Ophthalmologist yearly.  Drink alcohol in moderation- Limit alcohol intake to one drink or less a day.  Never drink and drive.  Depression- Your emotional health is as important as your physical health.  If you're feeling down or losing interest in things you normally enjoy, please talk to your healthcare provider.  Seat Belts- can save your life; always wear one  Smoke/Carbon Monoxide detectors- These detectors need to be installed on the appropriate level of your home.  Replace  batteries at least once a year.  Violence- If anyone is threatening or hurting you, please tell your healthcare provider.  Living Will/ Health care power of attorney- Discuss with your healthcare provider and family.

## 2017-04-15 ENCOUNTER — Telehealth: Payer: Self-pay | Admitting: Physician Assistant

## 2017-04-15 DIAGNOSIS — K2990 Gastroduodenitis, unspecified, without bleeding: Secondary | ICD-10-CM | POA: Insufficient documentation

## 2017-04-15 DIAGNOSIS — Q8909 Congenital malformations of spleen: Secondary | ICD-10-CM | POA: Insufficient documentation

## 2017-04-15 HISTORY — DX: Gastroduodenitis, unspecified, without bleeding: K29.90

## 2017-04-15 HISTORY — DX: Congenital malformations of spleen: Q89.09

## 2017-04-15 NOTE — Telephone Encounter (Signed)
Did pt decline flu shot?

## 2017-04-17 ENCOUNTER — Ambulatory Visit: Payer: Medicaid Other

## 2017-04-17 NOTE — Telephone Encounter (Signed)
declined

## 2017-04-19 ENCOUNTER — Telehealth: Payer: Self-pay | Admitting: Gastroenterology

## 2017-04-19 ENCOUNTER — Telehealth: Payer: Self-pay | Admitting: Physician Assistant

## 2017-04-19 NOTE — Telephone Encounter (Signed)
Is she wanting to go for splenic artery anerysm management. I do not see any reason you need to go unless you are having CP. BP is great. I order lipid panel but does not appear you have had it drawn.

## 2017-04-19 NOTE — Addendum Note (Signed)
Addended by: [name] on: 04/19/2017 04:06 PM   Modules accepted: Orders

## 2017-04-19 NOTE — Telephone Encounter (Signed)
From a GI standpoint, she is safe to start that exercise regimen

## 2017-04-19 NOTE — Telephone Encounter (Signed)
Pt has been advised and will keep f/u appt.

## 2017-04-19 NOTE — Telephone Encounter (Signed)
Pt has an appt for 10/30 with Dr Ardis Hughs and would like to know if she can start water aerobics and treadmill.

## 2017-04-19 NOTE — Telephone Encounter (Signed)
Jamie [name] called and stated you were going to set her up with Cardiology and she would like to go to Dr. Novella Rob at [facility] in [place]. She also stated she has been waiting a week and would like to get this done soon.   [name]

## 2017-05-06 ENCOUNTER — Encounter: Payer: Self-pay | Admitting: Physician Assistant

## 2017-05-06 ENCOUNTER — Telehealth: Payer: Self-pay | Admitting: Physician Assistant

## 2017-05-06 ENCOUNTER — Ambulatory Visit (INDEPENDENT_AMBULATORY_CARE_PROVIDER_SITE_OTHER): Payer: Medicaid Other | Admitting: Physician Assistant

## 2017-05-06 VITALS — BP 112/59 | HR 71 | Wt 152.0 lb

## 2017-05-06 DIAGNOSIS — Z789 Other specified health status: Secondary | ICD-10-CM | POA: Diagnosis not present

## 2017-05-06 DIAGNOSIS — Z1329 Encounter for screening for other suspected endocrine disorder: Secondary | ICD-10-CM

## 2017-05-06 DIAGNOSIS — E7800 Pure hypercholesterolemia, unspecified: Secondary | ICD-10-CM | POA: Diagnosis not present

## 2017-05-06 DIAGNOSIS — I6523 Occlusion and stenosis of bilateral carotid arteries: Secondary | ICD-10-CM

## 2017-05-06 DIAGNOSIS — R42 Dizziness and giddiness: Secondary | ICD-10-CM | POA: Diagnosis not present

## 2017-05-06 DIAGNOSIS — K2990 Gastroduodenitis, unspecified, without bleeding: Secondary | ICD-10-CM | POA: Diagnosis not present

## 2017-05-06 DIAGNOSIS — Z87448 Personal history of other diseases of urinary system: Secondary | ICD-10-CM | POA: Diagnosis not present

## 2017-05-06 DIAGNOSIS — Z1159 Encounter for screening for other viral diseases: Secondary | ICD-10-CM

## 2017-05-06 DIAGNOSIS — Z9189 Other specified personal risk factors, not elsewhere classified: Secondary | ICD-10-CM

## 2017-05-06 MED ORDER — MECLIZINE HCL 25 MG PO TABS: 25.0000 mg | ORAL_TABLET | Freq: Three times a day (TID) | ORAL | 1 refills | Status: DC | PRN

## 2017-05-06 MED ORDER — CARBAMIDE PEROXIDE 6.5 % OT SOLN: 10.0000 [drp] | Freq: Two times a day (BID) | OTIC | 0 refills | Status: DC

## 2017-05-06 NOTE — Telephone Encounter (Signed)
Patient called request to have Hep C lab added to her labs-vew
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
## 2017-05-06 NOTE — Telephone Encounter (Signed)
Done

## 2017-05-07 LAB — POCT URINALYSIS DIPSTICK
BILIRUBIN UA: NEGATIVE
Blood, UA: NEGATIVE
GLUCOSE UA: NEGATIVE
Ketones, UA: NEGATIVE
LEUKOCYTES UA: NEGATIVE
PH UA: 5.5 (ref 5.0–8.0)
Protein, UA: NEGATIVE
Spec Grav, UA: 1.015 (ref 1.010–1.025)
Urobilinogen, UA: 0.2 E.U./dL

## 2017-05-08 ENCOUNTER — Encounter: Payer: Self-pay | Admitting: Physician Assistant

## 2017-05-08 DIAGNOSIS — I6523 Occlusion and stenosis of bilateral carotid arteries: Secondary | ICD-10-CM

## 2017-05-08 DIAGNOSIS — Z789 Other specified health status: Secondary | ICD-10-CM | POA: Insufficient documentation

## 2017-05-08 DIAGNOSIS — Z9189 Other specified personal risk factors, not elsewhere classified: Secondary | ICD-10-CM | POA: Insufficient documentation

## 2017-05-08 HISTORY — DX: Occlusion and stenosis of bilateral carotid arteries: I65.23

## 2017-05-08 HISTORY — DX: Other specified personal risk factors, not elsewhere classified: Z91.89

## 2017-05-08 HISTORY — DX: Other specified health status: Z78.9

## 2017-05-08 LAB — LIPID PANEL
CHOLESTEROL: 235 mg/dL — AB (ref <200)
HDL: 56 mg/dL (ref >50)
LDL CHOLESTEROL (CALC): 150 mg/dL — AB
Non-HDL Cholesterol (Calc): 179 mg/dL (calc) — ABNORMAL HIGH (ref <130)
Total CHOL/HDL Ratio: 4.2 (calc) (ref <5.0)
Triglycerides: 155 mg/dL — ABNORMAL HIGH (ref <150)

## 2017-05-08 LAB — COMPLETE METABOLIC PANEL WITH GFR
AG Ratio: 1.8 (calc) (ref 1.0–2.5)
ALBUMIN MSPROF: 4.2 g/dL (ref 3.6–5.1)
ALT: 15 U/L (ref 6–29)
AST: 22 U/L (ref 10–35)
Alkaline phosphatase (APISO): 51 U/L (ref 33–130)
BUN: 12 mg/dL (ref 7–25)
CALCIUM: 9.3 mg/dL (ref 8.6–10.4)
CO2: 29 mmol/L (ref 20–32)
CREATININE: 0.76 mg/dL (ref 0.50–0.99)
Chloride: 102 mmol/L (ref 98–110)
GFR, EST AFRICAN AMERICAN: 96 mL/min/{1.73_m2} (ref >=60)
GFR, EST NON AFRICAN AMERICAN: 83 mL/min/{1.73_m2} (ref >=60)
GLOBULIN: 2.3 g/dL (ref 1.9–3.7)
Glucose, Bld: 87 mg/dL (ref 65–99)
Potassium: 4.2 mmol/L (ref 3.5–5.3)
SODIUM: 139 mmol/L (ref 135–146)
Total Bilirubin: 0.7 mg/dL (ref 0.2–1.2)
Total Protein: 6.5 g/dL (ref 6.1–8.1)

## 2017-05-08 LAB — CBC
HEMATOCRIT: 36.1 % (ref 35.0–45.0)
HEMOGLOBIN: 12.4 g/dL (ref 11.7–15.5)
MCH: 30.1 pg (ref 27.0–33.0)
MCHC: 34.3 g/dL (ref 32.0–36.0)
MCV: 87.6 fL (ref 80.0–100.0)
MPV: 10.3 fL (ref 7.5–12.5)
Platelets: 246 10*3/uL (ref 140–400)
RBC: 4.12 10*6/uL (ref 3.80–5.10)
RDW: 12.5 % (ref 11.0–15.0)
WBC: 4.1 10*3/uL (ref 3.8–10.8)

## 2017-05-08 LAB — HEPATITIS C ANTIBODY
Hepatitis C Ab: NONREACTIVE
SIGNAL TO CUT-OFF: 0.01 (ref <1.00)

## 2017-05-08 LAB — TSH: TSH: 3.9 m[IU]/L (ref 0.40–4.50)

## 2017-05-08 LAB — C-REACTIVE PROTEIN: CRP: 1.4 mg/L (ref <8.0)

## 2017-05-08 LAB — VITAMIN D 25 HYDROXY (VIT D DEFICIENCY, FRACTURES): Vit D, 25-Hydroxy: 24 ng/mL — ABNORMAL LOW (ref 30–100)

## 2017-05-08 LAB — VITAMIN B12: Vitamin B-12: 136 pg/mL — ABNORMAL LOW (ref 200–1100)

## 2017-05-08 NOTE — Progress Notes (Signed)
Call pt: HDL good.  LDL 150 not at goal and TG barely increased. Lipoprotein not back but I feel like for prevention you should consider statin to prevent cardiovascular event.  Vitamin D is low what mg are you taking?  B12 is really low. We can give you b12 shots to get back up and then try to manage with oral b12.

## 2017-05-08 NOTE — Progress Notes (Signed)
Call pt: hep C negative.  Inflammation rate has improved and looks good.  Thyroid normal range.  No anemia.  Urine sent for culture but no blood found in urine. No leukocytes either.

## 2017-05-10 ENCOUNTER — Telehealth: Payer: Self-pay | Admitting: Physician Assistant

## 2017-05-10 LAB — URINE CULTURE
MICRO NUMBER:: 81060898
SPECIMEN QUALITY: ADEQUATE

## 2017-05-10 LAB — LIPOPROTEIN A (LPA): Lipoprotein (a): <10 nmol/L (ref <75)

## 2017-05-10 NOTE — Telephone Encounter (Signed)
auth completed, imaging notified.

## 2017-05-10 NOTE — Telephone Encounter (Signed)
-----   Message from [name] sent at 05/09/2017 12:59 PM EDT ----- Regarding: carotid ultrasound Jeanmarie Hubert:  There is an order for this patiento to have a carotid ultrasound.  She has medicaid, so that will need a precert.  Please let us know when we can schedule.  Thanks, Hoyle Sauer

## 2017-05-13 ENCOUNTER — Other Ambulatory Visit: Payer: Self-pay | Admitting: Physician Assistant

## 2017-05-13 ENCOUNTER — Telehealth: Payer: Self-pay | Admitting: Physician Assistant

## 2017-05-13 DIAGNOSIS — R6883 Chills (without fever): Secondary | ICD-10-CM

## 2017-05-13 DIAGNOSIS — N3001 Acute cystitis with hematuria: Secondary | ICD-10-CM

## 2017-05-13 DIAGNOSIS — R531 Weakness: Secondary | ICD-10-CM

## 2017-05-13 MED ORDER — AMOXICILLIN-POT CLAVULANATE 875-125 MG PO TABS: 1.0000 | ORAL_TABLET | Freq: Two times a day (BID) | ORAL | 0 refills | Status: DC

## 2017-05-13 NOTE — Addendum Note (Signed)
Addended by: [name] on: 05/13/2017 05:30 PM   Modules accepted: Orders

## 2017-05-13 NOTE — Telephone Encounter (Signed)
Called pt.

## 2017-05-13 NOTE — Telephone Encounter (Signed)
Patient called requesting to speak to you about labs said the nurse she spoke with did not help her understand labs and she would like a mammogram and doppler order. Thanks

## 2017-05-14 NOTE — Telephone Encounter (Signed)
Patient has already had the labs drawn.

## 2017-05-14 NOTE — Telephone Encounter (Signed)
Ok we will fax order to the lab. Come today.

## 2017-05-14 NOTE — Telephone Encounter (Signed)
Pt called. She wants lab order for bacteria in the blood and should she fast..  Thank you.

## 2017-05-14 NOTE — Telephone Encounter (Signed)
Yes, she said she came today.

## 2017-05-14 NOTE — Telephone Encounter (Signed)
She had new lab, blood culture, right?

## 2017-05-21 ENCOUNTER — Other Ambulatory Visit: Payer: Self-pay | Admitting: Physician Assistant

## 2017-05-21 DIAGNOSIS — Z1231 Encounter for screening mammogram for malignant neoplasm of breast: Secondary | ICD-10-CM

## 2017-05-21 LAB — CULTURE BLOOD MANUAL
Micro Number: 81097966
Result: NO GROWTH
SPECIMEN QUALITY 12: ADEQUATE

## 2017-05-22 ENCOUNTER — Other Ambulatory Visit: Payer: Self-pay | Admitting: Physician Assistant

## 2017-05-22 ENCOUNTER — Telehealth: Payer: Self-pay

## 2017-05-22 ENCOUNTER — Ambulatory Visit (INDEPENDENT_AMBULATORY_CARE_PROVIDER_SITE_OTHER): Payer: Medicaid Other

## 2017-05-22 DIAGNOSIS — M79662 Pain in left lower leg: Secondary | ICD-10-CM

## 2017-05-22 DIAGNOSIS — Z1231 Encounter for screening mammogram for malignant neoplasm of breast: Secondary | ICD-10-CM | POA: Diagnosis not present

## 2017-05-22 NOTE — Telephone Encounter (Signed)
FYI it was not in my computer result basket. I left hard copy on test with no growth after 7 days.

## 2017-05-22 NOTE — Telephone Encounter (Signed)
I have placed pt's blood culture results in your box.  She has called x2 today for the results.

## 2017-05-22 NOTE — Telephone Encounter (Signed)
Called [facility] as blood culture is saying active in epic.  They faxed over results.  Called pt with result of no growth after 7 days.

## 2017-05-24 ENCOUNTER — Ambulatory Visit (HOSPITAL_BASED_OUTPATIENT_CLINIC_OR_DEPARTMENT_OTHER)
Admission: RE | Admit: 2017-05-24 | Discharge: 2017-05-24 | Disposition: A | Discharge status: Home / Self Care | Payer: Medicaid Other | Source: Ambulatory Visit | Attending: Physician Assistant | Admitting: Physician Assistant

## 2017-05-24 DIAGNOSIS — E7800 Pure hypercholesterolemia, unspecified: Secondary | ICD-10-CM | POA: Diagnosis not present

## 2017-05-24 DIAGNOSIS — I6523 Occlusion and stenosis of bilateral carotid arteries: Secondary | ICD-10-CM | POA: Diagnosis present

## 2017-05-29 ENCOUNTER — Encounter: Payer: Self-pay | Admitting: Obstetrics & Gynecology

## 2017-05-31 ENCOUNTER — Encounter: Payer: Self-pay | Admitting: Physician Assistant

## 2017-05-31 LAB — CULTURE, BLOOD (SINGLE)

## 2017-06-04 ENCOUNTER — Ambulatory Visit: Payer: Medicaid Other

## 2017-06-05 ENCOUNTER — Telehealth: Payer: Self-pay | Admitting: Physician Assistant

## 2017-06-05 MED ORDER — ERGOCALCIFEROL 1.25 MG (50000 UT) PO CAPS: 50000.0000 [IU] | ORAL_CAPSULE | ORAL | 5 refills | Status: DC

## 2017-06-05 NOTE — Telephone Encounter (Signed)
Sent once weekly dose for next 3 months then can check with blood work.

## 2017-06-05 NOTE — Telephone Encounter (Signed)
Please advise 

## 2017-06-05 NOTE — Telephone Encounter (Signed)
Patient called stated she would like to have vitamin D 3 called in what she is taking now is not working or helping her. Thanks

## 2017-06-05 NOTE — Telephone Encounter (Signed)
Left VM advising Pt.  

## 2017-06-07 ENCOUNTER — Ambulatory Visit (INDEPENDENT_AMBULATORY_CARE_PROVIDER_SITE_OTHER): Payer: Medicaid Other | Admitting: Physician Assistant

## 2017-06-07 VITALS — BP 112/55 | HR 86 | Wt 149.0 lb

## 2017-06-07 DIAGNOSIS — E538 Deficiency of other specified B group vitamins: Secondary | ICD-10-CM

## 2017-06-07 MED ORDER — CYANOCOBALAMIN 1000 MCG/ML IJ SOLN
1000.0000 ug | Freq: Once | INTRAMUSCULAR | Status: AC
  Administered 2017-06-07: 1000 ug via INTRAMUSCULAR

## 2017-06-07 NOTE — Progress Notes (Signed)
Pt came into clinic today for first B12 injection. Denies any chest pain. Pt tolerated injection in right deltoid well, no immediate complications. Advised to return in 1 month for next injection, then we can recheck labs to see if she can switch to oral. Verbalized understanding.   Agree with above plan. [name] PA-C

## 2017-06-09 ENCOUNTER — Emergency Department (HOSPITAL_BASED_OUTPATIENT_CLINIC_OR_DEPARTMENT_OTHER)
Admission: EM | Admit: 2017-06-09 | Discharge: 2017-06-09 | Disposition: A | Discharge status: Home / Self Care | Payer: Medicaid Other | Attending: Emergency Medicine | Admitting: Emergency Medicine

## 2017-06-09 ENCOUNTER — Encounter (HOSPITAL_BASED_OUTPATIENT_CLINIC_OR_DEPARTMENT_OTHER): Payer: Self-pay | Admitting: Emergency Medicine

## 2017-06-09 DIAGNOSIS — R040 Epistaxis: Secondary | ICD-10-CM | POA: Diagnosis present

## 2017-06-09 DIAGNOSIS — N938 Other specified abnormal uterine and vaginal bleeding: Secondary | ICD-10-CM | POA: Diagnosis not present

## 2017-06-09 DIAGNOSIS — Z91010 Allergy to peanuts: Secondary | ICD-10-CM | POA: Diagnosis not present

## 2017-06-09 DIAGNOSIS — Z87891 Personal history of nicotine dependence: Secondary | ICD-10-CM | POA: Insufficient documentation

## 2017-06-09 DIAGNOSIS — N939 Abnormal uterine and vaginal bleeding, unspecified: Secondary | ICD-10-CM

## 2017-06-09 DIAGNOSIS — R8290 Unspecified abnormal findings in urine: Secondary | ICD-10-CM

## 2017-06-09 DIAGNOSIS — R8279 Other abnormal findings on microbiological examination of urine: Secondary | ICD-10-CM | POA: Insufficient documentation

## 2017-06-09 DIAGNOSIS — Z79899 Other long term (current) drug therapy: Secondary | ICD-10-CM | POA: Diagnosis not present

## 2017-06-09 LAB — CBC WITH DIFFERENTIAL/PLATELET
BASOS ABS: 0 10*3/uL (ref 0.0–0.1)
BASOS PCT: 1 %
Eosinophils Absolute: 0 10*3/uL (ref 0.0–0.7)
Eosinophils Relative: 1 %
HEMATOCRIT: 35.5 % — AB (ref 36.0–46.0)
Hemoglobin: 12.2 g/dL (ref 12.0–15.0)
LYMPHS PCT: 18 %
Lymphs Abs: 1 10*3/uL (ref 0.7–4.0)
MCH: 30.5 pg (ref 26.0–34.0)
MCHC: 34.4 g/dL (ref 30.0–36.0)
MCV: 88.8 fL (ref 78.0–100.0)
Monocytes Absolute: 0.5 10*3/uL (ref 0.1–1.0)
Monocytes Relative: 9 %
NEUTROS ABS: 3.9 10*3/uL (ref 1.7–7.7)
Neutrophils Relative %: 71 %
PLATELETS: 309 10*3/uL (ref 150–400)
RBC: 4 MIL/uL (ref 3.87–5.11)
RDW: 12.2 % (ref 11.5–15.5)
WBC: 5.4 10*3/uL (ref 4.0–10.5)

## 2017-06-09 LAB — BASIC METABOLIC PANEL
ANION GAP: 5 (ref 5–15)
BUN: 14 mg/dL (ref 6–20)
CALCIUM: 9.1 mg/dL (ref 8.9–10.3)
CO2: 29 mmol/L (ref 22–32)
Chloride: 107 mmol/L (ref 101–111)
Creatinine, Ser: 0.71 mg/dL (ref 0.44–1.00)
GFR calc non Af Amer: >60 mL/min (ref >60)
Glucose, Bld: 96 mg/dL (ref 65–99)
Potassium: 4.7 mmol/L (ref 3.5–5.1)
SODIUM: 141 mmol/L (ref 135–145)

## 2017-06-09 LAB — URINALYSIS, ROUTINE W REFLEX MICROSCOPIC
Bilirubin Urine: NEGATIVE
Glucose, UA: NEGATIVE mg/dL
HGB URINE DIPSTICK: NEGATIVE
Ketones, ur: NEGATIVE mg/dL
Nitrite: NEGATIVE
PH: 6 (ref 5.0–8.0)
Protein, ur: NEGATIVE mg/dL

## 2017-06-09 LAB — URINALYSIS, MICROSCOPIC (REFLEX): RBC / HPF: NONE SEEN RBC/hpf (ref 0–5)

## 2017-06-09 LAB — PROTIME-INR
INR: 0.93
PROTHROMBIN TIME: 12.4 s (ref 11.4–15.2)

## 2017-06-09 LAB — SEDIMENTATION RATE: Sed Rate: 13 mm/hr (ref 0–22)

## 2017-06-09 NOTE — Discharge Instructions (Signed)
1.  Urinalysis still has some white blood cells even though you finish your antibiotics about a week and a half ago.  Urine culture has been ordered.  Have your doctor and your urologist review these results to determine if further treatment is needed.  Return if you have pain or burning with urination or develop fever or nausea. 2.  Your vaginal bleeding was coming from a small tear in the vaginal tissues. 3.  This time your nosebleed also appears to be due to small amount of trauma to the nasal septum.  If you have recurrent bleeding or other concerning symptoms, it will need to recheck.

## 2017-06-09 NOTE — ED Triage Notes (Signed)
Reports woke up with nosebleed to L nare this morning and also noticed some vaginal bleeding when she went to the bathroom at church. Denies pain.

## 2017-06-09 NOTE — ED Provider Notes (Signed)
[facility] [facility] EMERGENCY DEPARTMENT Provider Note   CSN: [id number] Arrival date & time: 06/09/17  1310     History   Chief Complaint Chief Complaint  Patient presents with  . Vaginal Bleeding  . Epistaxis    HPI Jamie [name] is a 66 y.o. female.  HPI Patient reported she experienced some bleeding from her left nare this morning.  She could see the spot on her septum that was bleeding.  It did stop bleeding by itself.  Does not read blood.  Patient reports she went on to church and went to the bathroom before going in and noted a little bit of blood in her underwear.  It on the tissue was well.  The blood was bright red.  Has not had any problems with bleeding or spotting.  Patient has not had a menstrual cycle in years.  She does however place a valium tablet intravaginally each evening based on instructions from her urologist.  Patient finished treatment for a UTI about 10 days ago. Past Medical History:  Diagnosis Date  . Anxiety   . Depression   . Diverticulitis   . Diverticulosis   . Headache   . Hyperlipidemia   . IBS (irritable bowel syndrome)   . Lung mass   . Syncope 03/08/2015    Patient Active Problem List   Diagnosis Date Noted  . Carotid stenosis, asymptomatic, bilateral 05/08/2017  . History of excessive cerumen 05/08/2017  . Cardiovascular risk factor 05/08/2017  . Gastritis and duodenitis 04/15/2017  . 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 Splenic artery aneurysm ([place]) 03/22/2017  . Vision changes 01/25/2017  . Hematuria 01/25/2017  . History of diverticulitis 01/25/2017  . Acute left lower quadrant pain 01/25/2017  . Exposure to chemical inhalation 11/06/2016  . Right knee pain 11/05/2016  . Former smoker 05/14/2016  . Hypopigmentation 01/11/2016  . Anaphylactic reaction due to food 01/11/2016  . Vaginal dryness 01/11/2016  . Multiple food allergies 01/11/2016  . 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 Carpal tunnel syndrome 10/17/2015    . Left arm numbness 10/13/2015  . Neck pain 10/13/2015  . Pernicious anemia 10/13/2015  . Chronically dry eyes 10/13/2015  . DDD (degenerative disc disease), cervical 08/31/2015  . Mid back pain on left side 07/20/2015  . Hair loss 07/20/2015  . Family history of renal cancer 07/20/2015  . History of shingles 07/19/2015  . Low iron stores 07/19/2015  . Thyroid activity decreased 07/19/2015  . Constipation 07/19/2015  . Hyperpigmentation of skin 07/19/2015  . Depression with anxiety 06/18/2015  . Normocytic anemia 06/18/2015  . Diverticulitis of large intestine with abscess without bleeding   . B12 deficiency 06/13/2015  . Migraine with aura and with status migrainosus, not intractable 04/20/2015  . Lung mass 04/20/2015  . Osteopenia 01/20/2014  . Seasonal and perennial allergic rhinitis 11/06/2011  . Diverticulosis of large intestine 07/03/2011  . Hyperlipidemia 12/13/2010  . ONYCHOMYCOSIS, TOENAILS 06/09/2010  . ANXIETY DEPRESSION 06/09/2010    Past Surgical History:  Procedure Laterality Date  . ABDOMINAL HYSTERECTOMY    . HEMORRHOID SURGERY    . TUBAL LIGATION    . TUMOR REMOVAL     Left thigh     OB History    No data available       Home Medications    Prior to Admission medications   Medication Sig Start Date End Date Taking? Authorizing Provider  ALPRAZolam (XANAX) 0.5 MG tablet TAKE 1 TABLET BY MOUTH DAILY AS NEEDED  FOR ANXIETY 05/14/17  Yes Breeback, [name] L, PA-C  gabapentin (NEURONTIN) 300 MG capsule TAKE 1 CAPSULE(300 MG) BY MOUTH AT BEDTIME 05/23/17  Yes Trixie Dredge, PA-C  carbamide peroxide (DEBROX) 6.5 % OTIC solution Place 10 drops into both ears 2 (two) times daily. Dispense 1 bottle 05/06/17   Breeback, [name] L, PA-C  diphenhydrAMINE (BENADRYL) 25 MG tablet Take 1 tablet (25 mg total) by mouth every 6 (six) hours. 11/13/16   Jola Schmidt, MD  EPINEPHrine 0.3 mg/0.3 mL IJ SOAJ injection INJECT INTRAMUSCULARLY AS DIRECTED 12/09/16    [provider]  ergocalciferol (VITAMIN D2) 50000 units capsule Take 1 capsule (50,000 Units total) by mouth once a week. 06/05/17   Breeback, Royetta Car, PA-C  mebendazole (VERMOX) 100 MG chewable tablet Chew 100 mg by mouth 2 (two) times daily.    [provider]  meclizine (ANTIVERT) 25 MG tablet Take 1 tablet (25 mg total) by mouth 3 (three) times daily as needed for dizziness. 05/06/17   Breeback, [name] L, PA-C  NON FORMULARY Take 1 capsule by mouth 3 (three) times daily. Herbal supplements for gastritis    [provider]  ranitidine (ZANTAC) 150 MG capsule Take 1 capsule (150 mg total) by mouth 2 (two) times daily. 04/12/17   [name], PA-C    Family History Family History  Problem Relation Age of Onset  . Cancer Mother        renal cell carcinoma  . Diabetes Mother   . Heart disease Father        first MI in 33s  . Cancer Sister        cervical cancer  . Heart disease Brother        first MI in 15s  . Alcohol abuse Sister   . Fibromyalgia Sister   . Cancer Sister 54       pancreatic cancer  . Arthritis Sister        osteoarthritis  . Irritable bowel syndrome Sister   . Leukemia Unknown        grandson    Social History Social History  Substance Use Topics  . Smoking status: Former Smoker    Packs/day: 0.80    Years: 20.00    Types: Cigarettes    Quit date: 08/13/1985  . Smokeless tobacco: Never Used  . Alcohol use Yes     Comment: rare     Allergies   Ciprofloxacin; Iodinated diagnostic agents; Peanut butter flavor; and Shellfish allergy   Review of Systems Review of Systems 10 Systems reviewed and are negative for acute change except as noted in the HPI.   Physical Exam Updated Vital Signs BP 115/64 (BP Location: Right Arm)   Pulse 72   Temp 98.6 F (37 C) (Oral)   Resp 18   Ht 5' 4.5" (1.638 m)   Wt 68 kg (150 lb)   SpO2 100%   BMI 25.35 kg/m   Physical Exam  Constitutional: She is oriented to person, place, and  time. She appears well-developed and well-nourished. No distress.  HENT:  Head: Normocephalic and atraumatic.  Examination of nares is normal.  There is no bleeding or scab on either septum.  There is no blood in the nasal passages.  Lateral TMs normal without any hemotympanum.  Oral cavity normal without posterior oropharyngeal blood streaking of mucus.  Eyes: Conjunctivae and EOM are normal.  Neck: Neck supple.  Cardiovascular: Normal rate and regular rhythm.   No murmur heard. Pulmonary/Chest: Effort  normal and breath sounds normal. No respiratory distress.  Abdominal: Soft. She exhibits no distension. There is no tenderness. There is no guarding.  Genitourinary:  Genitourinary Comments: External vaginal exam is performed.  Patient has a small tear at the vaginal fourchette which is the site of bleeding.  See attached image.  Musculoskeletal: Normal range of motion. She exhibits no edema or tenderness.  Neurological: She is alert and oriented to person, place, and time. No cranial nerve deficit. She exhibits normal muscle tone. Coordination normal.  Skin: Skin is warm and dry.  Psychiatric: She has a normal mood and affect.  Nursing note and vitals reviewed.      ED Treatments / Results  Labs (all labs ordered are listed, but only abnormal results are displayed) Labs Reviewed  URINALYSIS, ROUTINE W REFLEX MICROSCOPIC - Abnormal; Notable for the following:       Result Value   Color, Urine STRAW (*)    Specific Gravity, Urine <1.005 (*)    Leukocytes, UA SMALL (*)    All other components within normal limits  URINALYSIS, MICROSCOPIC (REFLEX) - Abnormal; Notable for the following:    Bacteria, UA MANY (*)    Squamous Epithelial / LPF 0-5 (*)    All other components within normal limits  CBC WITH DIFFERENTIAL/PLATELET - Abnormal; Notable for the following:    HCT 35.5 (*)    All other components within normal limits  URINE CULTURE  PROTIME-INR  BASIC METABOLIC PANEL    SEDIMENTATION RATE    EKG  EKG Interpretation None       Radiology No results found.  Procedures Procedures (including critical care time)  Medications Ordered in ED Medications - No data to display   Initial Impression / Assessment and Plan / ED Course  I have reviewed the triage vital signs and the nursing notes.  Pertinent labs & imaging results that were available during my care of the patient were reviewed by me and considered in my medical decision making (see chart for details).      Final Clinical Impressions(s) / ED Diagnoses   Final diagnoses:  Vaginal bleeding  Anterior epistaxis  Abnormal finding on urinalysis   Patient CBC and PT/INR normal.  It appears she incidentally had 2 minor mucosal traumas today.  Patient does insert a vaginal tablet each evening.  I suspect mechanical trauma to thin vaginal mucosa.  There is a small tear present at the fourchette.  Patient also reports visualizing the source of bleeding in her nasal septum on the Kiesselbach plexus.  It is unlikely to is in any significant pathology.  Patient does incidentally have a positive UA.  She does not have symptoms.  She finished antibiotics 10 days ago.  We discussed the plan to obtain culture and have this reviewed by her PCP and urologist to determine if further treatment or diagnostic studies are indicated regarding this finding. New Prescriptions New Prescriptions   No medications on file     Charlesetta Shanks, MD 06/09/17 [phone]

## 2017-06-09 NOTE — ED Notes (Signed)
ED Provider at bedside. 

## 2017-06-09 NOTE — ED Notes (Signed)
Pt also reports that she has had "dizzy spells" x 1 month. Pt describes it as room spinning dizziness. Pt states her PCP is aware and is treating her for it, but pt remains concerned.

## 2017-06-11 ENCOUNTER — Ambulatory Visit (INDEPENDENT_AMBULATORY_CARE_PROVIDER_SITE_OTHER): Payer: Medicaid Other | Admitting: Gastroenterology

## 2017-06-11 ENCOUNTER — Encounter: Payer: Self-pay | Admitting: Gastroenterology

## 2017-06-11 VITALS — BP 110/62 | HR 80 | Ht 64.0 in | Wt 152.8 lb

## 2017-06-11 DIAGNOSIS — K5900 Constipation, unspecified: Secondary | ICD-10-CM

## 2017-06-11 DIAGNOSIS — R140 Abdominal distension (gaseous): Secondary | ICD-10-CM

## 2017-06-11 LAB — URINE CULTURE: Culture: <10000 — AB

## 2017-06-11 NOTE — Progress Notes (Signed)
Upper endoscopy August 2018, Dr. Ardis Hughs, done for generalized abdominal pain.  There was mild nonspecific gastritis, biopsy suggested chronic atrophic gastritis. Colonoscopy August 2018, Dr. Ardis Hughs, done for abdominal pain.  Multiple diverticulum in the left colon associate with mucosal thickening and tortuosity and erythema.  No polyps.  She was recommended to have repeat colonoscopy at 10-year interval for routine risk screening  CT scan June 2018 "prominent colonic diverticula most notable sigmoid colon with significant muscular hypertrophy" CT scan November 2016 "sigmoid diverticulitis complicated by microperforation and small abscess" CT scan July 2015 "diverticulosis of the colon most prominent in the sigmoid region.  No CT findings to suggest diverticulitis. Ultrasound of abdomen June 2015 normal gallbladder normal bile duct. CT scan May 2015, outside hospital, read as "sigmoid diverticulitis".    Blood work 2 days ago June 09, 2017 normal CBC, normal sed rate, normal basic metabolic profile  HPI: This is a 66 year old woman whom I last saw about 2 months ago at the time of a colonoscopy and upper endoscopy  She has a variety of abdominal and non-abdominal complaints.  Bloating since her colonoscopy/EGD 2-3 months. She has lower abdominal pains with straining.    She has WBC in her urine. She isn't happy about recent pelvic US.  She had nose bleed this past weekend and also blood in her underpants. She "panic moded" and found WBC in her urine again, found several strands of 'strong bacteria'  After eating she feels she has bloating, food sits a while.  She started fiber and probiotic; these were mail order.    She was advised to try vaginal valium suppository for constipation by urologist.  Chief complaint is bloating, constipation  No blood in her stool.  ROS: complete GI ROS as described in HPI, all other review negative.  Constitutional:  No unintentional weight  loss   Past Medical History:  Diagnosis Date  . Anxiety   . Depression   . Diverticulitis   . Diverticulosis   . Headache   . Hyperlipidemia   . IBS (irritable bowel syndrome)   . Lung mass   . Syncope 03/08/2015    Past Surgical History:  Procedure Laterality Date  . ABDOMINAL HYSTERECTOMY    . HEMORRHOID SURGERY    . TUBAL LIGATION    . TUMOR REMOVAL     Left thigh     Current Outpatient Prescriptions  Medication Sig Dispense Refill  . ALPRAZolam (XANAX) 0.5 MG tablet TAKE 1 TABLET BY MOUTH DAILY AS NEEDED FOR ANXIETY 30 tablet 0  . ergocalciferol (VITAMIN D2) 50000 units capsule Take 1 capsule (50,000 Units total) by mouth once a week. 4 capsule 5  . FIBER PO Take by mouth.    . gabapentin (NEURONTIN) 300 MG capsule TAKE 1 CAPSULE(300 MG) BY MOUTH AT BEDTIME 30 capsule 0  . NON FORMULARY Take 1 capsule by mouth 3 (three) times daily. Herbal supplements for gastritis    . Probiotic Product (PROBIOTIC DAILY PO) Take by mouth.    . ranitidine (ZANTAC) 150 MG capsule Take 1 capsule (150 mg total) by mouth 2 (two) times daily. 60 capsule 3   No current facility-administered medications for this visit.     Allergies as of 06/11/2017 - Review Complete 06/11/2017  Allergen Reaction Noted  . Ciprofloxacin  01/31/2012  . Iodinated diagnostic agents Other (See Comments) 11/13/2016  . Peanut butter flavor Nausea And Vomiting 02/13/2012  . Shellfish allergy Swelling 10/01/2011    Family History  Problem Relation Age  of Onset  . Diabetes Mother   . Liver cancer Mother        renal cell   . Heart disease Father        first MI in 66s  . Cervical cancer Sister   . Heart disease Brother        first MI in 59s  . Alcohol abuse Sister   . Fibromyalgia Sister   . Pancreatic cancer Sister 62  . Arthritis Sister        osteoarthritis  . Irritable bowel syndrome Sister   . Leukemia Unknown        grandson  . Stomach cancer Neg Hx   . Colon cancer Neg Hx     Social  History   Social History  . Marital status: Divorced    Spouse name: N/A  . Number of children: 3  . Years of education: N/A   Occupational History  . Student    Social History Main Topics  . Smoking status: Former Smoker    Packs/day: 0.80    Years: 20.00    Types: Cigarettes    Quit date: 08/13/1985  . Smokeless tobacco: Never Used  . Alcohol use Yes     Comment: rare  . Drug use: No  . Sexual activity: Not Currently    Partners: Male    Birth control/ protection: None   Other Topics Concern  . Not on file   Social History Narrative   Lives with and grandchild (she is foster parent for grand child) Completed school full time.   Caffeine use; occasional   Works in Personal assistant, Economist.   In process of divorce.             Physical Exam: BP 110/62   Ht '5\' 4"'  (1.626 m)   Wt 152 lb 12.8 oz (69.3 kg)   BMI 26.23 kg/m  Constitutional: generally well-appearing Psychiatric: alert and oriented x3 Abdomen: soft, nontender, nondistended, no obvious ascites, no peritoneal signs, normal bowel sounds No peripheral edema noted in lower extremities  Assessment and plan: 66 y.o. female with myriad GI and non GI complaints, most importantly bloating and constipation  She is very excited and happy about a new mail order a fiber and probiotic regimen that she has received but has not started yet.  I recommended she give it a try.  Hopefully this will help her chronic constipation and bloating.  She does know to expect that the bloating might worsen temporarily when starting fiber.  She will call to report on her response in several weeks.  I think most of her symptoms are functional however she does have signs of chronic diverticulosis changes diverticulitis changes in her left colon.  She has had 2 CT scan proven episodes of acute sigmoid diverticulitis and chronically thickened colon in that region.  Please see the "Patient Instructions" section for addition  details about the plan.  Owens Loffler, MD [facility] 06/11/2017, 8:39 AM

## 2017-06-11 NOTE — Patient Instructions (Addendum)
Start and stay on your new fiber and probiotics and call my office in 4-5 weeks to report on your response.  Normal BMI (Body Mass Index- based on height and weight) is between 19 and 25. Your BMI today is Body mass index is 26.23 kg/m. Marland Kitchen Please consider follow up  regarding your BMI with your Primary Care Provider.

## 2017-06-14 ENCOUNTER — Ambulatory Visit: Payer: Medicaid Other | Admitting: Gastroenterology

## 2017-06-17 ENCOUNTER — Ambulatory Visit (INDEPENDENT_AMBULATORY_CARE_PROVIDER_SITE_OTHER): Payer: Medicaid Other | Admitting: Osteopathic Medicine

## 2017-06-17 VITALS — BP 113/70 | HR 90 | Temp 98.1°F | Wt 152.0 lb

## 2017-06-17 DIAGNOSIS — N3000 Acute cystitis without hematuria: Secondary | ICD-10-CM

## 2017-06-17 LAB — POCT URINALYSIS DIPSTICK
Bilirubin, UA: NEGATIVE
GLUCOSE UA: NEGATIVE
Ketones, UA: NEGATIVE
NITRITE UA: NEGATIVE
PH UA: 5.5 (ref 5.0–8.0)
PROTEIN UA: NEGATIVE
Spec Grav, UA: <=1.005 — AB (ref 1.010–1.025)
UROBILINOGEN UA: 0.2 U/dL

## 2017-06-17 MED ORDER — SULFAMETHOXAZOLE-TRIMETHOPRIM 800-160 MG PO TABS: 1.0000 | ORAL_TABLET | Freq: Two times a day (BID) | ORAL | 0 refills | Status: DC

## 2017-06-17 MED ORDER — BLOOD GLUCOSE MONITOR KIT: PACK | 1 refills | Status: DC

## 2017-06-17 NOTE — Progress Notes (Signed)
HPI: Jamie [name] is a 66 y.o. female with PMH  has a past medical history of Anxiety, Depression, Diverticulitis, Diverticulosis, Headache, Hyperlipidemia, IBS (irritable bowel syndrome), Lung mass, and Syncope (03/08/2015).  who presents to [facility] today, 06/17/17,  for chief complaint of:  Chief Complaint  Patient presents with  . Urinary Tract Infection    Frequency, urinary pain, suprapubic pain, hurts to pee and hurts afterward as well. No hx frequent UTI. (+)Klebsiella UTI per records earlier this year.   Recently went to urologist - normal cystoscopy per patient. Was given intravaginal valium prescription - not sure if this was a misunderstanding, she reports not having sex w/ hx dyspareunia but she is currently single and not active or trying to be anyway. They wrote another prescription she can't remember.     Past medical, surgical, social and family history reviewed:  Patient Active Problem List   Diagnosis Date Noted  . Carotid stenosis, asymptomatic, bilateral 05/08/2017  . History of excessive cerumen 05/08/2017  . Cardiovascular risk factor 05/08/2017  . Gastritis and duodenitis 04/15/2017  . 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 Splenic artery aneurysm ([place]) 03/22/2017  . Vision changes 01/25/2017  . Hematuria 01/25/2017  . History of diverticulitis 01/25/2017  . Acute left lower quadrant pain 01/25/2017  . Exposure to chemical inhalation 11/06/2016  . Right knee pain 11/05/2016  . Former smoker 05/14/2016  . Hypopigmentation 01/11/2016  . Anaphylactic reaction due to food 01/11/2016  . Vaginal dryness 01/11/2016  . Multiple food allergies 01/11/2016  . 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 Carpal tunnel syndrome 10/17/2015  . Left arm numbness 10/13/2015  . Neck pain 10/13/2015  . Pernicious anemia 10/13/2015  . Chronically dry eyes 10/13/2015  . DDD (degenerative disc disease), cervical 08/31/2015  . Mid back pain  on left side 07/20/2015  . Hair loss 07/20/2015  . Family history of renal cancer 07/20/2015  . History of shingles 07/19/2015  . Low iron stores 07/19/2015  . Thyroid activity decreased 07/19/2015  . Constipation 07/19/2015  . Hyperpigmentation of skin 07/19/2015  . Depression with anxiety 06/18/2015  . Normocytic anemia 06/18/2015  . Diverticulitis of large intestine with abscess without bleeding   . B12 deficiency 06/13/2015  . Migraine with aura and with status migrainosus, not intractable 04/20/2015  . Lung mass 04/20/2015  . Osteopenia 01/20/2014  . Seasonal and perennial allergic rhinitis 11/06/2011  . Diverticulosis of large intestine 07/03/2011  . Hyperlipidemia 12/13/2010  . ONYCHOMYCOSIS, TOENAILS 06/09/2010  . ANXIETY DEPRESSION 06/09/2010    Past Surgical History:  Procedure Laterality Date  . ABDOMINAL HYSTERECTOMY    . HEMORRHOID SURGERY    . TUBAL LIGATION    . TUMOR REMOVAL     Left thigh     Social History   Tobacco Use  . Smoking status: Former Smoker    Packs/day: 0.80    Years: 20.00    Pack years: 16.00    Types: Cigarettes    Last attempt to quit: 08/13/1985    Years since quitting: 31.8  . Smokeless tobacco: Never Used  Substance Use Topics  . Alcohol use: Yes    Comment: rare    Family History  Problem Relation Age of Onset  . Diabetes Mother   . Liver cancer Mother        renal cell   . Heart disease Father        first MI in 38s  . Cervical cancer Sister   .  Heart disease Brother        first MI in 30s  . Alcohol abuse Sister   . Fibromyalgia Sister   . Pancreatic cancer Sister 45  . Arthritis Sister        osteoarthritis  . Irritable bowel syndrome Sister   . Leukemia Unknown        grandson  . Stomach cancer Neg Hx   . Colon cancer Neg Hx      Current medication list and allergy/intolerance information reviewed:    Current Outpatient Medications  Medication Sig Dispense Refill  . ALPRAZolam (XANAX) 0.5 MG tablet TAKE  1 TABLET BY MOUTH DAILY AS NEEDED FOR ANXIETY 30 tablet 0  . ergocalciferol (VITAMIN D2) 50000 units capsule Take 1 capsule (50,000 Units total) by mouth once a week. 4 capsule 5  . FIBER PO Take by mouth.    . gabapentin (NEURONTIN) 300 MG capsule TAKE 1 CAPSULE(300 MG) BY MOUTH AT BEDTIME 30 capsule 0  . NON FORMULARY Take 1 capsule by mouth 3 (three) times daily. Herbal supplements for gastritis    . Probiotic Product (PROBIOTIC DAILY PO) Take by mouth.    . ranitidine (ZANTAC) 150 MG capsule Take 1 capsule (150 mg total) by mouth 2 (two) times daily. 60 capsule 3   No current facility-administered medications for this visit.     Allergies  Allergen Reactions  . Ciprofloxacin     Facial numbness  . Iodinated Diagnostic Agents Other (See Comments)    Face and chest felt "hot", throat felt tight.   . Peanut Butter Flavor Nausea And Vomiting    N/V  . Shellfish Allergy Swelling      Review of Systems:  Constitutional:  No  fever, no chills, No recent illness  Cardiac: No  chest pain, No  pressure  Respiratory:  No  shortness of breath.  Gastrointestinal: +suprapubic abdominal pain, No  nausea, No  vomiting,  No  blood in stool, No  diarrhea, No  constipation   Musculoskeletal: No new myalgia/arthralgia  Genitourinary: No  incontinence, No  abnormal genital bleeding, No abnormal genital discharge  Skin: No  Rash   Exam:  BP 113/70   Pulse 90   Temp 98.1 F (36.7 C) (Oral)   Wt 152 lb (68.9 kg)   BMI 26.09 kg/m   Constitutional: VS see above. General Appearance: alert, well-developed, well-nourished, NAD  Eyes: Normal lids and conjunctive, non-icteric sclera  Ears, Nose, Mouth, Throat: MMM, Normal external inspection ears/nares/mouth/lips/gums.   Neck: No masses, trachea midline  Respiratory: Normal respiratory effort.   Musculoskeletal: Gait normal  Neurological: Normal balance/coordination. No tremor.   Psychiatric: Normal judgment/insight. Normal mood  and affect. Oriented x3.    Results for orders placed or performed in visit on 06/17/17 (from the past 72 hour(s))  Urinalysis Dipstick     Status: Abnormal   Collection Time: 06/17/17  4:58 PM  Result Value Ref Range   Color, UA yellow    Clarity, UA clear    Glucose, UA negative    Bilirubin, UA negative    Ketones, UA negative    Spec Grav, UA <=1.005 (A) 1.010 - 1.025   Blood, UA moderate    pH, UA 5.5 5.0 - 8.0   Protein, UA negative    Urobilinogen, UA 0.2 0.2 or 1.0 E.U./dL   Nitrite, UA negative    Leukocytes, UA Small (1+) (A) Negative    ASSESSMENT/PLAN:   She Googled an antibiotic she wanted, I advised  I would treat based on clinical judgment and most recent culture results, would be happy to review any articles she finds online and discuss.   Will see if we can get records from urology.  She also requests diabetic testing supplies, she is worried about low Glc at home. No hx DM. No LOC. Advised f/u w/ PCP on this issue.   Acute cystitis without hematuria - Plan: sulfamethoxazole-trimethoprim (BACTRIM DS) 800-160 MG tablet, Urine Culture, Urinalysis Dipstick       Visit summary with medication list and pertinent instructions was printed for patient to review. All questions at time of visit were answered - patient instructed to contact office with any additional concerns. ER/RTC precautions were reviewed with the patient. Follow-up plan: Return if symptoms worsen or fail to improve.  Note: Total time spent 25 minutes, greater than 50% of the visit was spent face-to-face counseling and coordinating care for the following: The encounter diagnosis was Acute cystitis without hematuria..  Please note: voice recognition software was used to produce this document, and typos may escape review. Please contact me for any needed clarifications.

## 2017-06-18 ENCOUNTER — Encounter: Payer: Self-pay | Admitting: Osteopathic Medicine

## 2017-06-19 LAB — URINE CULTURE
MICRO NUMBER:: 81240234
SPECIMEN QUALITY:: ADEQUATE

## 2017-06-20 ENCOUNTER — Telehealth: Payer: Self-pay | Admitting: Physician Assistant

## 2017-06-20 NOTE — Telephone Encounter (Signed)
Seems there has been some misunderstanding - patient did see me for pain with urination and requesting a specific antibiotic that she had googled.   Urine dipstick in the office did have leukocytes - given this plus her symptoms, we were treating for presumed UTI. We obtained culture for confirmation of diagnosis.   The culture came back as growth of small amount of multiple bacterial species, which indicates contaminated sample without a true bladder infection. Patient was advised that she needs to follow-up with her urologist, or see PCP. There are other conditions which can mimic urinary tract infections, such as interstitial cystitis and others. I do not believe that antibiotics will not be helpful based on current test results.   Will cc PCP as Juluis Rainier Will cc [place], who just spoke to this patient on the phone about culture results.

## 2017-06-20 NOTE — Telephone Encounter (Signed)
Patient was seen 3 days ago and was given a 3 day antibiotic and it did not help. She was requesting during appt dicloxacillin  had urologist look at bladder and urine sample stated no uti's urologist did a scope into bladder said it was clear and good was told urine is pure. Came in to see Dr. Sheppard Coil 2 days later and found uti. Is there any injections she can get instead of taking pills that are not working for her, she said is there anything stronger she can get called in she does not want to come back in unless she can get a injec of some sort

## 2017-07-15 ENCOUNTER — Ambulatory Visit (INDEPENDENT_AMBULATORY_CARE_PROVIDER_SITE_OTHER): Payer: Medicaid Other | Admitting: Physician Assistant

## 2017-07-15 ENCOUNTER — Ambulatory Visit (INDEPENDENT_AMBULATORY_CARE_PROVIDER_SITE_OTHER): Payer: Medicaid Other

## 2017-07-15 ENCOUNTER — Encounter: Payer: Self-pay | Admitting: Physician Assistant

## 2017-07-15 ENCOUNTER — Telehealth: Payer: Self-pay | Admitting: Physician Assistant

## 2017-07-15 VITALS — BP 116/55 | HR 117 | Temp 98.5°F | Ht 64.0 in | Wt 148.0 lb

## 2017-07-15 DIAGNOSIS — I700 Atherosclerosis of aorta: Secondary | ICD-10-CM

## 2017-07-15 DIAGNOSIS — R05 Cough: Secondary | ICD-10-CM | POA: Diagnosis not present

## 2017-07-15 DIAGNOSIS — M940 Chondrocostal junction syndrome [Tietze]: Secondary | ICD-10-CM

## 2017-07-15 DIAGNOSIS — R059 Cough, unspecified: Secondary | ICD-10-CM

## 2017-07-15 DIAGNOSIS — R911 Solitary pulmonary nodule: Secondary | ICD-10-CM

## 2017-07-15 DIAGNOSIS — N390 Urinary tract infection, site not specified: Secondary | ICD-10-CM | POA: Diagnosis not present

## 2017-07-15 DIAGNOSIS — R319 Hematuria, unspecified: Secondary | ICD-10-CM | POA: Diagnosis not present

## 2017-07-15 LAB — POCT URINALYSIS DIPSTICK
Bilirubin, UA: NEGATIVE
Glucose, UA: NEGATIVE
KETONES UA: NEGATIVE
Nitrite, UA: NEGATIVE
PH UA: 5.5 (ref 5.0–8.0)
PROTEIN UA: NEGATIVE
Spec Grav, UA: 1.025 (ref 1.010–1.025)
UROBILINOGEN UA: 0.2 U/dL

## 2017-07-15 MED ORDER — BENZONATATE 100 MG PO CAPS: 200.0000 mg | ORAL_CAPSULE | Freq: Three times a day (TID) | ORAL | 0 refills | Status: DC

## 2017-07-15 MED ORDER — CYCLOBENZAPRINE HCL 10 MG PO TABS: 10.0000 mg | ORAL_TABLET | Freq: Three times a day (TID) | ORAL | 0 refills | Status: DC | PRN

## 2017-07-15 NOTE — Telephone Encounter (Signed)
Patient called and states she seen Jamie [name] this morning and needs her referral to Therapy put in ASAP for [facility] location where they do this at.
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
## 2017-07-15 NOTE — Patient Instructions (Addendum)
[phone]- PT referral  ayr nasal saline  mucinex 600mg  twice a day longer than 3 days.  Vaginal weighted balls  Consider solumedrol shot/prednisone burst for right sided chest pain if not improving.        Costochondritis Costochondritis is swelling and irritation (inflammation) of the tissue (cartilage) that connects your ribs to your breastbone (sternum). This causes pain in the front of your chest. The pain usually starts gradually and involves more than one rib. What are the causes? The exact cause of this condition is not always known. It results from stress on the cartilage where your ribs attach to your sternum. The cause of this stress could be:  Chest injury (trauma).  Exercise or activity, such as lifting.  Severe coughing.  What increases the risk? You may be at higher risk for this condition if you:  Are female.  Are 4?64 years old.  Recently started a new exercise or work activity.  Have low levels of vitamin D.  Have a condition that makes you cough frequently.  What are the signs or symptoms? The main symptom of this condition is chest pain. The pain:  Usually starts gradually and can be sharp or dull.  Gets worse with deep breathing, coughing, or exercise.  Gets better with rest.  May be worse when you press on the sternum-rib connection (tenderness).  How is this diagnosed? This condition is diagnosed based on your symptoms, medical history, and a physical exam. Your health care provider will check for tenderness when pressing on your sternum. This is the most important finding. You may also have tests to rule out other causes of chest pain. These may include:  A chest X-ray to check for lung problems.  An electrocardiogram (ECG) to see if you have a heart problem that could be causing the pain.  An imaging scan to rule out a chest or rib fracture.  How is this treated? This condition usually goes away on its own over time. Your health care  provider may prescribe an NSAID to reduce pain and inflammation. Your health care provider may also suggest that you:  Rest and avoid activities that make pain worse.  Apply heat or cold to the area to reduce pain and inflammation.  Do exercises to stretch your chest muscles.  If these treatments do not help, your health care provider may inject a numbing medicine at the sternum-rib connection to help relieve the pain. Follow these instructions at home:  Avoid activities that make pain worse. This includes any activities that use chest, abdominal, and side muscles.  If directed, put ice on the painful area: ? Put ice in a plastic bag. ? Place a towel between your skin and the bag. ? Leave the ice on for 20 minutes, 2-3 times a day.  If directed, apply heat to the affected area as often as told by your health care provider. Use the heat source that your health care provider recommends, such as a moist heat pack or a heating pad. ? Place a towel between your skin and the heat source. ? Leave the heat on for 20-30 minutes. ? Remove the heat if your skin turns bright red. This is especially important if you are unable to feel pain, heat, or cold. You may have a greater risk of getting burned.  Take over-the-counter and prescription medicines only as told by your health care provider.  Return to your normal activities as told by your health care provider. Ask your health care  provider what activities are safe for you.  Keep all follow-up visits as told by your health care provider. This is important. Contact a health care provider if:  You have chills or a fever.  Your pain does not go away or it gets worse.  You have a cough that does not go away (is persistent). Get help right away if:  You have shortness of breath. This information is not intended to replace advice given to you by your health care provider. Make sure you discuss any questions you have with your health care  provider. Document Released: 05/09/2005 Document Revised: 02/17/2016 Document Reviewed: 11/23/2015 Elsevier Interactive Patient Education  [employer].

## 2017-07-15 NOTE — Telephone Encounter (Signed)
Patient states this morning that her urologist had made referral for pelvic floor dysfunction. They need to do this referral so they will get updates.

## 2017-07-16 LAB — URINE CULTURE
MICRO NUMBER:: 81356567
Result:: NO GROWTH
SPECIMEN QUALITY:: ADEQUATE

## 2017-07-17 ENCOUNTER — Encounter: Payer: Self-pay | Admitting: Physical Therapy

## 2017-07-17 ENCOUNTER — Ambulatory Visit: Payer: Medicaid Other | Attending: Urology | Admitting: Physical Therapy

## 2017-07-17 DIAGNOSIS — M6281 Muscle weakness (generalized): Secondary | ICD-10-CM | POA: Diagnosis present

## 2017-07-17 DIAGNOSIS — M545 Low back pain, unspecified: Secondary | ICD-10-CM

## 2017-07-17 DIAGNOSIS — M25652 Stiffness of left hip, not elsewhere classified: Secondary | ICD-10-CM

## 2017-07-17 DIAGNOSIS — M62838 Other muscle spasm: Secondary | ICD-10-CM | POA: Diagnosis present

## 2017-07-17 NOTE — Patient Instructions (Addendum)
About Abdominal Massage  Abdominal massage, also called external colon massage, is a self-treatment circular massage technique that can reduce and eliminate gas and ease constipation. The colon naturally contracts in waves in a clockwise direction starting from inside the right hip, moving up toward the ribs, across the belly, and down inside the left hip.  When you perform circular abdominal massage, you help stimulate your colon's normal wave pattern of movement called peristalsis.  It is most beneficial when done after eating.  Positioning You can practice abdominal massage with oil while lying down, or in the shower with soap.  Some people find that it is just as effective to do the massage through clothing while sitting or standing.  How to Massage Start by placing your finger tips or knuckles on your right side, just inside your hip bone.  . Make small circular movements while you move upward toward your rib cage.   . Once you reach the bottom right side of your rib cage, take your circular movements across to the left side of the bottom of your rib cage.  . Next, move downward until you reach the inside of your left hip bone.  This is the path your feces travel in your colon. . Continue to perform your abdominal massage in this pattern for 10 minutes each day.     You can apply as much pressure as is comfortable in your massage.  Start gently and build pressure as you continue to practice.  Notice any areas of pain as you massage; areas of slight pain may be relieved as you massage, but if you have areas of significant or intense pain, consult with your healthcare provider.  Other Considerations . General physical activity including bending and stretching can have a beneficial massage-like effect on the colon.  Deep breathing can also stimulate the colon because breathing deeply activates the same nervous system that supplies the colon.   . Abdominal massage should always be used in  combination with a bowel-conscious diet that is high in the proper type of fiber for you, fluids (primarily water), and a regular exercise program. Scar Massage  Scar massage is done to improve the mobility of scar, decrease scar tissue from building up, reduce adhesions, and prevent Keloids from forming. Start scar massage after scabs have fallen off by themselves and no open areas. The first few weeks after surgery, it is normal for a scar to appear pink or red and slightly raised. Scars can itch or have areas of numbness. Some scars may be sensitive.   Direct Scar massage: after scar is healed, no opening, no scab 1.  Place pads of two fingers together directly on the scar starting at one end of the scar. Move the fingers up and down across the scar holding 5 seconds one direction.  Then go opposite direction hold 5 seconds.  2. Move over to the next section of the scar and repeat.  Work your way along the entire length of the scar.   3. Next make diagonal movements along the scar holding 5 seconds at one direction. 4. Next movement is side to side. 5. Do not rub fingers over the scar.  Instead keep firm pressure and move scar over the tissue it is on top   Scar Lift and Roll 12 weeks after surgery. 1. Pinch a small amount of the scar between your first two fingers and thumb.  2. Roll the scar between your fingers for 5 to 15 seconds. 3.  Move along the scar and repeat until you have massaged the entire length of scar.   Stop the massage and call your doctor if you notice: 1. Increased redness 2. Bleeding from scar 3. Seepage coming from the scar 4. Scar is warmer and has increased pain   Guided Meditation for Pelvic Floor Relaxation  [employer]   [facility] [address], [place], [place] [address] Phone # [phone] Fax [phone]

## 2017-07-17 NOTE — Therapy (Addendum)
[facility] [address] [place], [place], [address] Phone: [phone]   Fax:  [phone]  Physical Therapy Evaluation  Patient Details  Name: Jamie [name] MRN: [id number] Date of Birth: [birth year] Referring Provider: Dr. Louis Meckel   Encounter Date: 07/17/2017  PT End of Session - 07/17/17 1240    Visit Number  1    Date for PT Re-Evaluation  10/09/17    Authorization Type  Medicaid    PT Start Time  1100    PT Stop Time  1140    PT Time Calculation (min)  40 min    Activity Tolerance  Patient tolerated treatment well    Behavior During Therapy  [facility] for tasks assessed/performed       Past Medical History:  Diagnosis Date  . Anxiety   . Depression   . Diverticulitis   . Diverticulosis   . Headache   . Hyperlipidemia   . IBS (irritable bowel syndrome)   . Lung mass   . Syncope 03/08/2015    Past Surgical History:  Procedure Laterality Date  . ABDOMINAL HYSTERECTOMY    . HEMORRHOID SURGERY    . TUBAL LIGATION    . TUMOR REMOVAL     Left thigh     There were no vitals filed for this visit.   Subjective Assessment - 07/17/17 1101    Subjective  Patient reports the last 2.5 months has had urine samples with strands of bacteria.  Patient had a scope in her bladder that was negative.  After the scope she had a UTI. Patient had a tube into the bladder and had E. Coli.  Patient is on a antibiotic for 10 days then will go back to MD.  She reports pain is coming from tight muscles.     How long can you sit comfortably?  pain level is 5/10 suprapubically    How long can you stand comfortably?  pain level 4-8/10 lower abdomin     How long can you walk comfortably?  pain level 7-8/10    Patient Stated Goals  strength to reduce pain    Currently in Pain?  Yes    Pain Score  5     Pain Location  Suprapubic    Pain Orientation  Lower    Pain Descriptors / Indicators   Discomfort;Burning;Constant;Contraction;Cramping like something is bulging out, vibration, long needle going up into vagina    Pain Type  Acute pain started 2.5 months    Pain Onset  More than a month ago    Pain Frequency  Constant    Aggravating Factors   sitting, standing, walking, full bladder, having to urinate and urinating    Pain Relieving Factors  heat    Multiple Pain Sites  Yes    Pain Score  9    Pain Location  Back    Pain Orientation  Lower    Pain Descriptors / Indicators  Stabbing    Pain Type  Acute pain    Pain Onset  More than a month ago    Pain Frequency  Constant    Aggravating Factors   standing, crossing left leg over right, picking up items    Pain Relieving Factors  lay on back with legs elevated         [facility] PT Assessment - 07/17/17 0001      Assessment   Medical Diagnosis  R10.2 Pelvic pain    Referring Provider  Dr.  Louis Meckel    Onset Date/Surgical Date  05/17/17    Prior Therapy  none      Precautions   Precautions  None      Restrictions   Weight Bearing Restrictions  No      Balance Screen   Has the patient fallen in the past 6 months  No    Has the patient had a decrease in activity level because of a fear of falling?   No    Is the patient reluctant to leave their home because of a fear of falling?   No      Home Film/video editor residence      Prior Function   Level of Independence  Independent      Cognition   Overall Cognitive Status  Within Functional Limits for tasks assessed      Observation/Other Assessments   Skin Integrity  hysterstorectomy scar is restricted      Posture/Postural Control   Posture/Postural Control  No significant limitations      ROM / Strength   AROM / PROM / Strength  AROM;PROM;Strength      PROM   Right Hip External Rotation   88    Right Hip Internal Rotation   12 pain    Left Hip Flexion  85    Left Hip External Rotation   40    Left Hip Internal Rotation    5    Left Hip ABduction  20 pain      Strength   Right Hip Flexion  3+/5    Right Hip Extension  3+/5    Right Hip External Rotation   3+/5    Right Hip Internal Rotation  3+/5    Right Hip ABduction  3-/5    Right Hip ADduction  2/5    Left Hip Flexion  3+/5    Left Hip Extension  3+/5    Left Hip External Rotation  4/5    Left Hip Internal Rotation  4/5    Left Hip ABduction  3+/5    Left Hip ADduction  2/5      Right Hip   Right Hip Flexion  108      Palpation   Spinal mobility  decreased mobility of L1-L5 anterior to posterior    SI assessment   left ilium is rotated posteriorly    Palpation comment  tightness and tenderness located suprapubically, left abdomen,              Objective measurements completed on examination: See above findings.    Pelvic Floor Special Questions - 07/17/17 0001    Currently Sexually Active  No    Urinary Leakage  Yes    Pad use  mini pad 2/day    Activities that cause leaking  Coughing;Sneezing;Lifting;Laughing    Urinary urgency  -- 8 times per day    Fecal incontinence  No constipation; strain, push on right side of anus for BM    Falling out feeling (prolapse)  Yes    External Perineal Exam  difficulty with bulging the perineum    Skin Integrity  Intact dryness    Pelvic Floor Internal Exam  Patient comfirms identification and approves PT to asses pelvic floor    Exam Type  Vaginal    Palpation  tender and thick  more on left  pelvic floor muscles compared to right    Strength  fair squeeze, definite lift  Tone  increased on left               PT Education - 07/17/17 1239    Education provided  Yes    Education Details  pelvic floor meditation, abdominal massage, scar massage    Person(s) Educated  Patient    Methods  Explanation;Demonstration;Verbal cues;Handout    Comprehension  Returned demonstration;Verbalized understanding       PT Short Term Goals - 07/17/17 1413      PT SHORT TERM GOAL #1   Title   independent with initial HEP    Baseline  not educated yet    Time  4    Period  Weeks    Status  New    Target Date  08/14/17      PT SHORT TERM GOAL #2   Title  ability to perform abdominal massage to improve mobility of intestines and reduction in pain    Baseline  not educated yet    Time  4    Period  Weeks    Status  New    Target Date  08/14/17      PT SHORT TERM GOAL #3   Title  understand how to toilet correctly to reduce strain on the pelvic floor     Baseline  not educated yet    Time  4    Period  Weeks    Status  New    Target Date  08/14/17        PT Long Term Goals - 07/17/17 1415      PT LONG TERM GOAL #1   Title  independent with HEP and how to progress herself    Baseline  not educated yet    Time  12    Period  Weeks    Status  New    Target Date  10/09/17      PT LONG TERM GOAL #2   Title  understand ways to manage pain and relax her muscles to improve function    Baseline  Not educated yet    Time  12    Period  Weeks    Status  New      PT LONG TERM GOAL #3   Title  left hip flexion >/= 105 degrees to make it easier to sit and not strain the pelvic floor    Baseline  left hip flexion 88 degrees    Time  12    Period  Weeks    Status  New    Target Date  10/09/17      PT LONG TERM GOAL #4   Title  lower abdominal pain </= 3-4/10 due to ability to relax the pelvic floor muscles with urination and bowel movement    Baseline  pain level 8/10; left pelvic floor muscles are hypertonic    Time  12    Period  Weeks    Status  New    Target Date  10/09/17      PT LONG TERM GOAL #5   Title  abililty to stand to wash dishes or wait in line for 10-15 min with pain </= 3/10 due to increased strength of bilateral hips and core    Baseline  pain level is 8/10    Time  12    Period  Weeks    Status  New    Target Date  09/11/17             Plan -  07/17/17 1401    Clinical Impression Statement  Patient is a 66 year old female with  pelvic pain that started on 05/17/2017 after having a scope in the urethra and finding E.Coli in the urine. .  Patient reports her pain in the back is 9/10 and suprapubically is 5/10.  Patient reports her pain is worse with sitting 5/10 standing 4-8/10, walking 7-8/10 , after urinating is 8/10.  PROM of hips right/left in degrees: external rotation 88/40, internal rotation 12/5, fleixon 105/85, and abduction is 20/20.  Strength of hip right/left/5 as follows: flexion 3+/3+/5, extension 3+/3+/5, external rotation 3+/4/5, internal rotation 3+/4/5, abduction 3-/3+/5, and adduction 2/5.  Pelvic floor strength is 3/5 with slight lift.  Right ilium is rotated posteriorly.  Decreased anterior/postterior movement of L1-L5 decreased.  Increased thickness of left pelvic floor muscles compared to right with tenderness throughout.  Decreased contraction of the left urethra sphincter.  Tenderness located in bilateral ishiocavernosus and bulbocavernosus. Patient has vaginal dryness and skin is intact.  Patient will benefit from skilled therapy to improve mobility with reduction of pain to return to prior function and ability to urinate without increased in pain.     History and Personal Factors relevant to plan of care:  Abdominal hysterectomy; Anxiety, Diverticulitis, IBS, and Hemorroic surgery    Clinical Presentation  Evolving    Clinical Presentation due to:  Patient is not able to perform daily tasks due to increased pain and difficulty with urination due to the pain    Clinical Decision Making  Moderate    Rehab Potential  Excellent    Clinical Impairments Affecting Rehab Potential  Abdominal hysterectomy; Anxiety, Diverticulitis, IBS, and Hemorroic surgery    PT Frequency  1x / week    PT Duration  12 weeks    PT Treatment/Interventions  Cryotherapy;Electrical Stimulation;Moist Heat;Ultrasound;Therapeutic exercise;Therapeutic activities;Neuromuscular re-education;Patient/family education;Passive range of  motion;Manual techniques;Dry needling;Energy conservation    PT Next Visit Plan  joint mobilization to left hip and lumbar, muscle energy to left ilium, soft tissue work to abdomen and pelvic floor muscles, hip stretches, information on vaginal dryness    PT Home Exercise Plan  progress as needed    Consulted and Agree with Plan of Care  Patient       Patient will benefit from skilled therapeutic intervention in order to improve the following deficits and impairments:  Increased fascial restricitons, Pain, Decreased mobility, Increased muscle spasms, Impaired tone, Decreased activity tolerance, Decreased endurance, Decreased range of motion, Decreased strength, Hypomobility  Visit Diagnosis: Other muscle spasm - Plan: PT plan of care cert/re-cert  Acute bilateral low back pain without sciatica - Plan: PT plan of care cert/re-cert  Stiffness of left hip, not elsewhere classified - Plan: PT plan of care cert/re-cert  Muscle weakness (generalized) - Plan: PT plan of care cert/re-cert     Problem List Patient Active Problem List   Diagnosis Date Noted  . Carotid stenosis, asymptomatic, bilateral 05/08/2017  . History of excessive cerumen 05/08/2017  . Cardiovascular risk factor 05/08/2017  . Gastritis and duodenitis 04/15/2017  . 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 Splenic artery aneurysm ([place]) 03/22/2017  . Vision changes 01/25/2017  . Hematuria 01/25/2017  . History of diverticulitis 01/25/2017  . Acute left lower quadrant pain 01/25/2017  . Exposure to chemical inhalation 11/06/2016  . Right knee pain 11/05/2016  . Former smoker 05/14/2016  . Hypopigmentation 01/11/2016  . Anaphylactic reaction due to food 01/11/2016  . Vaginal dryness 01/11/2016  . Multiple food allergies  01/11/2016  . 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 Carpal tunnel syndrome 10/17/2015  . Left arm numbness 10/13/2015  . Neck pain 10/13/2015  . Pernicious anemia 10/13/2015  . Chronically dry eyes  10/13/2015  . DDD (degenerative disc disease), cervical 08/31/2015  . Mid back pain on left side 07/20/2015  . Hair loss 07/20/2015  . Family history of renal cancer 07/20/2015  . History of shingles 07/19/2015  . Low iron stores 07/19/2015  . Thyroid activity decreased 07/19/2015  . Constipation 07/19/2015  . Hyperpigmentation of skin 07/19/2015  . Depression with anxiety 06/18/2015  . Normocytic anemia 06/18/2015  . Diverticulitis of large intestine with abscess without bleeding   . B12 deficiency 06/13/2015  . Migraine with aura and with status migrainosus, not intractable 04/20/2015  . Lung mass 04/20/2015  . Osteopenia 01/20/2014  . Seasonal and perennial allergic rhinitis 11/06/2011  . Diverticulosis of large intestine 07/03/2011  . Hyperlipidemia 12/13/2010  . ONYCHOMYCOSIS, TOENAILS 06/09/2010  . ANXIETY DEPRESSION 06/09/2010    Earlie Counts, PT 07/17/17 2:23 PM   [facility] [address] [place], [place], [address] Phone: [phone]   Fax:  [phone]  Name: Jamie [name] MRN: [id number] Date of Birth: March 27, 1951 PHYSICAL THERAPY DISCHARGE SUMMARY  Visits from Start of Care: 1  Current functional level related to goals / functional outcomes: See above.  Patient has not returned since her initial evaluation.    Remaining deficits: See above.    Education / Equipment: HEP Plan:                                                    Patient goals were not met. Patient is being discharged due to not returning since the last visit.  Thank you for the referral. Earlie Counts, PT 08/14/17 9:19 AM  ?????

## 2017-07-17 NOTE — Telephone Encounter (Signed)
Tried calling patient back but her voicemail is full

## 2017-07-24 ENCOUNTER — Ambulatory Visit: Payer: Medicaid Other | Admitting: Physical Therapy

## 2017-07-31 ENCOUNTER — Encounter: Payer: Medicaid Other | Admitting: Physical Therapy

## 2017-08-02 ENCOUNTER — Other Ambulatory Visit: Payer: Self-pay

## 2017-08-02 MED ORDER — ALPRAZOLAM 0.5 MG PO TABS: 0.5000 mg | ORAL_TABLET | Freq: Every day | ORAL | 0 refills | Status: DC | PRN

## 2017-08-02 NOTE — Telephone Encounter (Signed)
Luis is out of her xanax. She needs them for the holidays.

## 2017-08-08 ENCOUNTER — Telehealth: Payer: Self-pay | Admitting: Physical Therapy

## 2017-08-08 ENCOUNTER — Ambulatory Visit: Payer: Medicaid Other | Admitting: Physical Therapy

## 2017-08-08 NOTE — Telephone Encounter (Signed)
Called patient about her 14:00 appointment she did not show for.  Unable to leave a message due to the voice mailbox is full.  Earlie Counts, PT @12 /27/2018@ 2:19 PM

## 2017-08-16 ENCOUNTER — Encounter: Payer: Self-pay | Admitting: Obstetrics & Gynecology

## 2017-08-19 ENCOUNTER — Encounter: Payer: Self-pay | Admitting: Physician Assistant

## 2017-08-19 ENCOUNTER — Ambulatory Visit (INDEPENDENT_AMBULATORY_CARE_PROVIDER_SITE_OTHER): Payer: Medicaid Other | Admitting: Physician Assistant

## 2017-08-19 VITALS — BP 106/63 | HR 77 | Ht 64.0 in | Wt 152.0 lb

## 2017-08-19 DIAGNOSIS — D508 Other iron deficiency anemias: Secondary | ICD-10-CM | POA: Diagnosis not present

## 2017-08-19 DIAGNOSIS — K2100 Gastro-esophageal reflux disease with esophagitis, without bleeding: Secondary | ICD-10-CM

## 2017-08-19 DIAGNOSIS — K5904 Chronic idiopathic constipation: Secondary | ICD-10-CM | POA: Diagnosis not present

## 2017-08-19 DIAGNOSIS — F419 Anxiety disorder, unspecified: Secondary | ICD-10-CM | POA: Diagnosis not present

## 2017-08-19 DIAGNOSIS — Z131 Encounter for screening for diabetes mellitus: Secondary | ICD-10-CM | POA: Diagnosis not present

## 2017-08-19 DIAGNOSIS — E7800 Pure hypercholesterolemia, unspecified: Secondary | ICD-10-CM | POA: Diagnosis not present

## 2017-08-19 DIAGNOSIS — R0789 Other chest pain: Secondary | ICD-10-CM | POA: Diagnosis not present

## 2017-08-19 DIAGNOSIS — R140 Abdominal distension (gaseous): Secondary | ICD-10-CM

## 2017-08-19 DIAGNOSIS — E538 Deficiency of other specified B group vitamins: Secondary | ICD-10-CM

## 2017-08-19 DIAGNOSIS — R8290 Unspecified abnormal findings in urine: Secondary | ICD-10-CM | POA: Diagnosis not present

## 2017-08-19 DIAGNOSIS — E559 Vitamin D deficiency, unspecified: Secondary | ICD-10-CM

## 2017-08-19 LAB — POCT URINALYSIS DIPSTICK
BILIRUBIN UA: NEGATIVE
GLUCOSE UA: NEGATIVE
Ketones, UA: NEGATIVE
Nitrite, UA: NEGATIVE
Protein, UA: NEGATIVE
RBC UA: NEGATIVE
Spec Grav, UA: 1.015 (ref 1.010–1.025)
Urobilinogen, UA: 0.2 E.U./dL
pH, UA: 6 (ref 5.0–8.0)

## 2017-08-19 MED ORDER — OMEPRAZOLE 40 MG PO CPDR: 40.0000 mg | DELAYED_RELEASE_CAPSULE | Freq: Every day | ORAL | 1 refills | Status: DC

## 2017-08-19 MED ORDER — DICLOFENAC SODIUM 1 % TD GEL: 4.0000 g | Freq: Four times a day (QID) | TRANSDERMAL | 4 refills | Status: DC

## 2017-08-19 MED ORDER — LINACLOTIDE 145 MCG PO CAPS: 145.0000 ug | ORAL_CAPSULE | Freq: Every day | ORAL | 5 refills | Status: DC

## 2017-08-19 MED ORDER — ALPRAZOLAM 0.5 MG PO TABS: 0.5000 mg | ORAL_TABLET | Freq: Every day | ORAL | 1 refills | Status: DC | PRN
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
## 2017-08-19 NOTE — Patient Instructions (Signed)
Sent linzess.  Sent voltaren gel.   Will send referral for stress/echo to rule out cardiac concerns.

## 2017-08-20 LAB — URINE CULTURE
MICRO NUMBER:: 90023798
SPECIMEN QUALITY: ADEQUATE

## 2017-08-22 ENCOUNTER — Telehealth: Payer: Self-pay | Admitting: Physician Assistant

## 2017-08-22 LAB — COMPLETE METABOLIC PANEL WITH GFR
AG Ratio: 1.6 (calc) (ref 1.0–2.5)
ALKALINE PHOSPHATASE (APISO): 60 U/L (ref 33–130)
ALT: 15 U/L (ref 6–29)
AST: 20 U/L (ref 10–35)
Albumin: 4.4 g/dL (ref 3.6–5.1)
BUN: 16 mg/dL (ref 7–25)
CO2: 32 mmol/L (ref 20–32)
CREATININE: 0.9 mg/dL (ref 0.50–0.99)
Calcium: 9.6 mg/dL (ref 8.6–10.4)
Chloride: 102 mmol/L (ref 98–110)
GFR, Est African American: 78 mL/min/{1.73_m2} (ref >=60)
GFR, Est Non African American: 68 mL/min/{1.73_m2} (ref >=60)
GLOBULIN: 2.7 g/dL (ref 1.9–3.7)
GLUCOSE: 94 mg/dL (ref 65–99)
Potassium: 5.3 mmol/L (ref 3.5–5.3)
SODIUM: 138 mmol/L (ref 135–146)
Total Bilirubin: 0.6 mg/dL (ref 0.2–1.2)
Total Protein: 7.1 g/dL (ref 6.1–8.1)

## 2017-08-22 LAB — FOOD ALLERGY PROFILE W/ REFLEXES
Allergen, Salmon, f41: <0.1 kU/L
Almonds: <0.1 kU/L
CLASS: 0
CLASS: 0
CLASS: 0
CLASS: 0
CLASS: 0
CLASS: 0
CLASS: 0
CLASS: 0
CLASS: 0
CLASS: 0
CLASS: 0
CLASS: 0
CLASS: 0
Class: 0
Class: 0
Fish Cod: <0.1 kU/L
Hazelnut: <0.1 kU/L
MILK IGE: 0.11 kU/L — AB
Scallop IgE: <0.1 kU/L
Shrimp IgE: <0.1 kU/L
Walnut: <0.1 kU/L

## 2017-08-22 LAB — CBC
HCT: 39.4 % (ref 35.0–45.0)
Hemoglobin: 13.6 g/dL (ref 11.7–15.5)
MCH: 29.9 pg (ref 27.0–33.0)
MCHC: 34.5 g/dL (ref 32.0–36.0)
MCV: 86.6 fL (ref 80.0–100.0)
MPV: 10 fL (ref 7.5–12.5)
Platelets: 320 10*3/uL (ref 140–400)
RBC: 4.55 10*6/uL (ref 3.80–5.10)
RDW: 12 % (ref 11.0–15.0)
WBC: 4.2 10*3/uL (ref 3.8–10.8)

## 2017-08-22 LAB — FOOD SPECIFIC IGG ALLERGY( ADULT)
ALLERGEN EGG WHITE IGG: 4.8 ug/mL — AB (ref <2.0)
CACAO (CHOCOLATE)IGG: 8.1 ug/mL — AB (ref <2.0)
CODFISH/SCROD IGG: 7 ug/mL — AB (ref <2.0)
COFFEE (F221) IGG: 6.7 ug/mL — AB (ref <2.0)
CORN IGG: 7.4 ug/mL — AB (ref <2.0)
Casein IgE: 10.2 ug/mL — ABNORMAL HIGH (ref <2.0)
PEANUT (F13) IGG: 6.8 ug/mL — ABNORMAL HIGH (ref <2.0)
Soybean IgG: 4.7 ug/mL — ABNORMAL HIGH (ref <2.0)
Tomato IgG: 9.4 ug/mL — ABNORMAL HIGH (ref <2.0)
Wheat IgG: 12.9 ug/mL — ABNORMAL HIGH (ref <2.0)
YEAST (F45) IGG: 13.7 ug/mL — ABNORMAL HIGH (ref <2.0)

## 2017-08-22 LAB — VITAMIN D 1,25 DIHYDROXY
Vitamin D 1, 25 (OH)2 Total: 42 pg/mL (ref 18–72)
Vitamin D2 1, 25 (OH)2: 24 pg/mL
Vitamin D3 1, 25 (OH)2: 18 pg/mL

## 2017-08-22 LAB — MILK COMPONENT PANEL
Allergen, Alpha-lactalb,f76: <0.1 kU/L
Allergen, Beta-lactoglob,f77: <0.1 kU/L
Allergen, Casein, f78: <0.1 kU/L
CLASS: 0
CLASS: 0
Class: 0

## 2017-08-22 LAB — C-REACTIVE PROTEIN: CRP: 2.2 mg/L (ref <8.0)

## 2017-08-22 LAB — HEMOGLOBIN A1C
HEMOGLOBIN A1C: 5.4 %{Hb} (ref <5.7)
Mean Plasma Glucose: 108 (calc)
eAG (mmol/L): 6 (calc)

## 2017-08-22 LAB — IRON,TIBC AND FERRITIN PANEL
%SAT: 22 % (calc) (ref 11–50)
Ferritin: 11 ng/mL — ABNORMAL LOW (ref 20–288)
IRON: 83 ug/dL (ref 45–160)
TIBC: 377 mcg/dL (calc) (ref 250–450)

## 2017-08-22 LAB — B12 AND FOLATE PANEL
FOLATE: 16.1 ng/mL
Vitamin B-12: 270 pg/mL (ref 200–1100)

## 2017-08-22 LAB — SEDIMENTATION RATE: SED RATE: 11 mm/h (ref 0–30)

## 2017-08-22 LAB — INTERPRETATION:

## 2017-08-22 NOTE — Telephone Encounter (Signed)
Patient called and is requesting to be referred to [facility]). Pt stated she has been there before. Thanks

## 2017-08-22 NOTE — Telephone Encounter (Signed)
Jamie [name] called and left a message stating she is supposed to have a heart ultrasound and the number to the cardiologist where she wants to go is [phone]. - CF

## 2017-08-23 ENCOUNTER — Other Ambulatory Visit: Payer: Self-pay | Admitting: Physician Assistant

## 2017-08-23 ENCOUNTER — Encounter: Payer: Self-pay | Admitting: Physician Assistant

## 2017-08-23 DIAGNOSIS — R0602 Shortness of breath: Secondary | ICD-10-CM

## 2017-08-23 DIAGNOSIS — R0789 Other chest pain: Secondary | ICD-10-CM

## 2017-08-23 DIAGNOSIS — R8290 Unspecified abnormal findings in urine: Secondary | ICD-10-CM

## 2017-08-23 DIAGNOSIS — R140 Abdominal distension (gaseous): Secondary | ICD-10-CM | POA: Insufficient documentation

## 2017-08-23 DIAGNOSIS — F418 Other specified anxiety disorders: Secondary | ICD-10-CM | POA: Insufficient documentation

## 2017-08-23 DIAGNOSIS — D508 Other iron deficiency anemias: Secondary | ICD-10-CM

## 2017-08-23 DIAGNOSIS — R4589 Other symptoms and signs involving emotional state: Secondary | ICD-10-CM | POA: Insufficient documentation

## 2017-08-23 DIAGNOSIS — F419 Anxiety disorder, unspecified: Secondary | ICD-10-CM | POA: Insufficient documentation

## 2017-08-23 DIAGNOSIS — E559 Vitamin D deficiency, unspecified: Secondary | ICD-10-CM

## 2017-08-23 HISTORY — DX: Vitamin D deficiency, unspecified: E55.9

## 2017-08-23 HISTORY — DX: Unspecified abnormal findings in urine: R82.90

## 2017-08-23 HISTORY — DX: Other chest pain: R07.89

## 2017-08-23 HISTORY — DX: Abdominal distension (gaseous): R14.0

## 2017-08-23 HISTORY — DX: Other specified anxiety disorders: F41.8

## 2017-08-23 HISTORY — DX: Other symptoms and signs involving emotional state: R45.89

## 2017-08-23 HISTORY — DX: Other iron deficiency anemias: D50.8

## 2017-08-23 NOTE — Telephone Encounter (Signed)
[place] referrals placed. Please use information given to you to make referrals. Can you please print off orders if needed for stress test and echo.

## 2017-08-30 ENCOUNTER — Telehealth: Payer: Self-pay

## 2017-08-30 NOTE — Telephone Encounter (Signed)
Patient called in request lab results on Food Allergy test. Patient states she has received all results except these. Advised I would forward message to PCP for response.

## 2017-09-02 ENCOUNTER — Encounter: Payer: Self-pay | Admitting: Physician Assistant

## 2017-09-02 DIAGNOSIS — T781XXA Other adverse food reactions, not elsewhere classified, initial encounter: Secondary | ICD-10-CM | POA: Insufficient documentation

## 2017-09-02 DIAGNOSIS — Z91011 Allergy to milk products: Secondary | ICD-10-CM | POA: Insufficient documentation

## 2017-09-02 HISTORY — DX: Other adverse food reactions, not elsewhere classified, initial encounter: T78.1XXA

## 2017-09-02 NOTE — Telephone Encounter (Signed)
Patient has multiple shown sensitives. I don't think it is feasible to elimate all these foods but start with the once that show greatest reaction: top 3 are yeast, wheat, casein(main protein in milk and in a lot of processed foods). Definitely avoid milk products.

## 2017-09-03 ENCOUNTER — Other Ambulatory Visit: Payer: Self-pay | Admitting: Physician Assistant

## 2017-09-03 ENCOUNTER — Telehealth: Payer: Self-pay | Admitting: Physician Assistant

## 2017-09-03 MED ORDER — FERROUS SULFATE 325 (65 FE) MG PO TABS: 325.0000 mg | ORAL_TABLET | Freq: Every day | ORAL | 1 refills | Status: DC

## 2017-09-03 NOTE — Telephone Encounter (Signed)
Pt called. Iron has not been called in.

## 2017-09-03 NOTE — Telephone Encounter (Signed)
sent 

## 2017-09-04 NOTE — Telephone Encounter (Signed)
Patient advised of recommendations.  

## 2017-09-06 ENCOUNTER — Ambulatory Visit: Payer: Medicaid Other | Admitting: Physician Assistant

## 2017-09-09 ENCOUNTER — Encounter: Payer: Self-pay | Admitting: Physician Assistant

## 2017-09-09 ENCOUNTER — Ambulatory Visit (INDEPENDENT_AMBULATORY_CARE_PROVIDER_SITE_OTHER): Payer: Medicaid Other | Admitting: Physician Assistant

## 2017-09-09 VITALS — BP 118/64 | HR 98 | Ht 64.02 in | Wt 150.0 lb

## 2017-09-09 DIAGNOSIS — R519 Headache, unspecified: Secondary | ICD-10-CM

## 2017-09-09 DIAGNOSIS — N8189 Other female genital prolapse: Secondary | ICD-10-CM | POA: Diagnosis not present

## 2017-09-09 DIAGNOSIS — D649 Anemia, unspecified: Secondary | ICD-10-CM | POA: Diagnosis not present

## 2017-09-09 DIAGNOSIS — R51 Headache: Secondary | ICD-10-CM | POA: Diagnosis not present

## 2017-09-09 DIAGNOSIS — Z91011 Allergy to milk products: Secondary | ICD-10-CM | POA: Diagnosis not present

## 2017-09-09 DIAGNOSIS — Z91018 Allergy to other foods: Secondary | ICD-10-CM

## 2017-09-09 DIAGNOSIS — D510 Vitamin B12 deficiency anemia due to intrinsic factor deficiency: Secondary | ICD-10-CM

## 2017-09-09 DIAGNOSIS — N760 Acute vaginitis: Secondary | ICD-10-CM | POA: Diagnosis not present

## 2017-09-09 LAB — POCT URINALYSIS DIPSTICK
Bilirubin, UA: NEGATIVE
Blood, UA: NEGATIVE
Glucose, UA: NEGATIVE
Ketones, UA: NEGATIVE
LEUKOCYTES UA: NEGATIVE
NITRITE UA: NEGATIVE
PH UA: 5.5 (ref 5.0–8.0)
PROTEIN UA: NEGATIVE
Spec Grav, UA: 1.015 (ref 1.010–1.025)
UROBILINOGEN UA: 0.2 U/dL

## 2017-09-09 MED ORDER — CYANOCOBALAMIN 1000 MCG/ML IJ SOLN
1000.0000 ug | Freq: Once | INTRAMUSCULAR | Status: AC
  Administered 2017-09-09: 1000 ug via INTRAMUSCULAR

## 2017-09-09 MED ORDER — CYANOCOBALAMIN 1000 MCG/ML IJ SOLN: 1000.0000 ug | Freq: Once | INTRAMUSCULAR | 0 refills | Status: DC
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
## 2017-09-10 LAB — WET PREP FOR TRICH, YEAST, CLUE
MICRO NUMBER:: 90116575
Specimen Quality: ADEQUATE

## 2017-09-11 ENCOUNTER — Telehealth: Payer: Self-pay

## 2017-09-11 ENCOUNTER — Other Ambulatory Visit: Payer: Self-pay | Admitting: Physician Assistant

## 2017-09-11 ENCOUNTER — Telehealth: Payer: Self-pay | Admitting: Gastroenterology

## 2017-09-11 DIAGNOSIS — N8189 Other female genital prolapse: Secondary | ICD-10-CM

## 2017-09-11 LAB — URINE CULTURE
MICRO NUMBER:: 90116579
SPECIMEN QUALITY: ADEQUATE

## 2017-09-11 NOTE — Progress Notes (Signed)
a 

## 2017-09-11 NOTE — Telephone Encounter (Signed)
Patient states her symptoms of bloating and constipation have mostly gone away but she now has a burning in her stomach and wants to speak with a nurse about what she can do or take. Pt last ov 10.30.18.

## 2017-09-11 NOTE — Telephone Encounter (Signed)
The pt has been advised that she should follow the instructions given to her by PCP and if she has a headache that was not addressed at her recent office visit she needs to call her PCP and notify her for possible evaluation.

## 2017-09-11 NOTE — Telephone Encounter (Signed)
Jamie [name] wants the PT referral sent to [facility].  Phone - [phone] Fax - [phone]

## 2017-09-12 ENCOUNTER — Other Ambulatory Visit: Payer: Self-pay

## 2017-09-12 ENCOUNTER — Encounter: Payer: Self-pay | Admitting: Physical Therapy

## 2017-09-12 ENCOUNTER — Ambulatory Visit: Payer: Medicaid Other | Attending: Physician Assistant | Admitting: Physical Therapy

## 2017-09-12 DIAGNOSIS — M62838 Other muscle spasm: Secondary | ICD-10-CM | POA: Diagnosis present

## 2017-09-12 DIAGNOSIS — R278 Other lack of coordination: Secondary | ICD-10-CM | POA: Diagnosis present

## 2017-09-12 DIAGNOSIS — M6281 Muscle weakness (generalized): Secondary | ICD-10-CM | POA: Diagnosis present

## 2017-09-12 NOTE — Therapy (Signed)
[facility] [address] [place], [place], [address] Phone: [phone]   Fax:  [phone]  Physical Therapy Evaluation  Patient Details  Name: Jamie [name] MRN: [id number] Date of Birth: 1950-10-09 Referring Provider: Dr. [name], PA-C   Encounter Date: 09/12/2017  PT End of Session - 09/12/17 1238    Visit Number  1    Date for PT Re-Evaluation  10/10/17    Authorization Type  Medicaid    PT Start Time  4008    PT Stop Time  1055    PT Time Calculation (min)  40 min    Activity Tolerance  Patient tolerated treatment well    Behavior During Therapy  [facility] for tasks assessed/performed       Past Medical History:  Diagnosis Date  . Anxiety   . Depression   . Diverticulitis   . Diverticulosis   . Headache   . Hyperlipidemia   . IBS (irritable bowel syndrome)   . Lung mass   . Syncope 03/08/2015    Past Surgical History:  Procedure Laterality Date  . ABDOMINAL HYSTERECTOMY    . HEMORRHOID SURGERY    . TUBAL LIGATION    . TUMOR REMOVAL     Left thigh     There were no vitals filed for this visit.   Subjective Assessment - 09/12/17 1020    Subjective  The E. coli is better now.  Patient has needle like pain in vagina.  Patient reports her vaginal skin is very thin. Patient feels like she may be having an orgasm feeling 1-2 times per day without stimulation. Patient has to push on the right side of the rectum to have a bowel movement.  Patiet empties her bladder.  Patient has a good urine flow. After patient urinates she feels like she has to go again.  Patient has urinary leakage.     How long can you sit comfortably?  pain level is 5-7/10 vaginal    How long can you stand comfortably?  pain level 4/10 lower vaginal    How long can you walk comfortably?  no pain    Patient Stated Goals  strengthen legs and pelvic floor to reduce urinary leakage    Currently in Pain?  Yes    Pain Score  6     Pain  Location  Vagina    Pain Orientation  Mid    Pain Descriptors / Indicators  Sharp;Shooting;Stabbing    Pain Type  Chronic pain    Pain Onset  More than a month ago    Pain Frequency  Intermittent    Aggravating Factors   sitting, standing lifting    Pain Relieving Factors  hold her perineal area until she is able to sit down    Multiple Pain Sites  No         [facility] PT Assessment - 09/12/17 0001      Assessment   Medical Diagnosis  N81.89 Weakness of pelvic floor    Referring Provider  Dr. [name], PA-C    Onset Date/Surgical Date  09/12/14    Prior Therapy  yes      Precautions   Precautions  None      Restrictions   Weight Bearing Restrictions  No      Balance Screen   Has the patient fallen in the past 6 months  No    Has the patient had a decrease in activity level because of  a fear of falling?   No    Is the patient reluctant to leave their home because of a fear of falling?   No      Home Film/video editor residence      Prior Function   Level of Independence  Independent      Cognition   Overall Cognitive Status  Within Functional Limits for tasks assessed      Observation/Other Assessments   Skin Integrity  hysterstorectomy scar is restricted      Posture/Postural Control   Posture/Postural Control  No significant limitations      ROM / Strength   AROM / PROM / Strength  AROM;PROM;Strength      AROM   Overall AROM Comments  lumbar extension decreased by 75% with pain      PROM   Overall PROM   Within functional limits for tasks performed      Strength   Right Hip Flexion  4-/5    Right Hip Extension  5/5    Right Hip External Rotation   5/5    Right Hip Internal Rotation  4/5    Right Hip ABduction  3/5    Right Hip ADduction  3/5    Left Hip Flexion  4-/5    Left Hip Extension  5/5    Left Hip External Rotation  5/5    Left Hip Internal Rotation  4/5    Left Hip ABduction  3+/5    Left Hip ADduction  3/5       Palpation   SI assessment   pelvis in correct alignment    Palpation comment  tightness where lower abdomenal scar is; Tenderness in bil. diaphgram,       Transfers   Transfers  Not assessed      Ambulation/Gait   Ambulation/Gait  No             Objective measurements completed on examination: See above findings.    Pelvic Floor Special Questions - 09/12/17 0001    Currently Sexually Active  No    Urinary Leakage  Yes    Pad use  not wearing them    Activities that cause leaking  Sneezing;Coughing night; pain with holding urine    Urinary urgency  Yes    Fecal incontinence  No constipation    Falling out feeling (prolapse)  No    External Perineal Exam  difficulty with bulging the perineum    Skin Integrity  Intact dryness    Pelvic Floor Internal Exam  Patient comfirms identification and approves PT to asses pelvic floor    Exam Type  Vaginal    Palpation  tender on all pelvic floor muscles, no lift with contraction, tenderness located in the vulva area    Strength  weak squeeze, no lift                 PT Short Term Goals - 07/17/17 1413      PT SHORT TERM GOAL #1   Title  independent with initial HEP    Baseline  not educated yet    Time  4    Period  Weeks    Status  New    Target Date  08/14/17      PT SHORT TERM GOAL #2   Title  ability to perform abdominal massage to improve mobility of intestines and reduction in pain    Baseline  not educated yet  Time  4    Period  Weeks    Status  New    Target Date  08/14/17      PT SHORT TERM GOAL #3   Title  understand how to toilet correctly to reduce strain on the pelvic floor     Baseline  not educated yet    Time  4    Period  Weeks    Status  New    Target Date  08/14/17        PT Long Term Goals - 09/12/17 1249      PT LONG TERM GOAL #1   Title  independent with HEP and how to progress herself    Baseline  not educated yet    Time  4    Period  Weeks    Status  New    Target  Date  10/10/17      PT LONG TERM GOAL #2   Title  understand ways to manage pain and relax her muscles to improve function    Baseline  Not educated yet    Time  4    Period  Weeks    Status  New    Target Date  10/10/17      PT LONG TERM GOAL #3   Title  when holding her urine pain in vagina decreases >/= 2/10 due to improved relaxation of the tissue    Baseline  pain is 6/10    Time  4    Period  Weeks    Status  New    Target Date  10/10/17      PT LONG TERM GOAL #4   Title  lower abdominal pain </= 3-4/10 due to ability to relax the pelvic floor muscles with sitting and standing    Baseline  pain level 8/10; left pelvic floor muscles are hypertonic    Time  4    Period  Weeks    Status  New    Target Date  10/10/17      PT LONG TERM GOAL #5   Title  urinary leakage with coughing and sneezing decreased to </= 1 time per month    Baseline  leaks urine every time    Time  4    Period  Weeks    Status  New    Target Date  10/10/17             Plan - 09/12/17 1239    Clinical Impression Statement  Patient is a 67 year old female with pelvic pain that started on 05/17/2017 after having a scope in the urethra.  Pain is intermitent at level 6/10 when she needs to urinate and will hold it back.  Pain with sitting is 5-7/10 and standing is 4/10. Patient has full hip ROM but lumbar extension is limited by 75% with pain. Strength of hip right/left/5: flexion  4-/4-/5, abduction 3/3+/5, adduction 3/3/5, Internal rotation 4/4/5. Pateint has urinary leakage with sneezing and coughing.  Hysterectomy scar is limited mobility and tenderness located in bilateral diaphragm.  Pelvic floor strength is 2/5 with tenderness located in obturator internist and levator ani.  patietn reports she has to push on the right side of her rectum to have a bowel movement.  Patient reports she has an orgasmic feeling 1-2 times per day without stimulation. Patient will benefit from skilled therapy to improve  mobility of tissue, improve strength and reduce pelvic floor pain.     History and Personal  Factors relevant to plan of care:  Abdominal hysterectomy; anxiety, diverticulititis, IBS and Hemorroid surgery    Clinical Presentation  Evolving    Clinical Presentation due to:  patient has difficulty with performing daily tasks due to increased pain and and urinary leakage    Clinical Decision Making  Moderate    Rehab Potential  Excellent    Clinical Impairments Affecting Rehab Potential  Abdominal hysterectomy; Anxiety, Diverticulitis, IBS, and Hemorroic surgery    PT Frequency  1x / week    PT Duration  4 weeks    PT Treatment/Interventions  Cryotherapy;Electrical Stimulation;Moist Heat;Ultrasound;Therapeutic exercise;Therapeutic activities;Neuromuscular re-education;Patient/family education;Passive range of motion;Manual techniques;Dry needling;Energy conservation;Biofeedback    PT Next Visit Plan  soft tissue work; hip streches; information on vaginal dryness, how to perform manual perineal soft tissue work    PT Home Exercise Plan  progress as needed    Consulted and Agree with Plan of Care  Patient       Patient will benefit from skilled therapeutic intervention in order to improve the following deficits and impairments:  Increased fascial restricitons, Pain, Decreased coordination, Decreased scar mobility, Decreased activity tolerance, Decreased endurance, Decreased strength  Visit Diagnosis: Other muscle spasm - Plan: PT plan of care cert/re-cert  Muscle weakness (generalized) - Plan: PT plan of care cert/re-cert  Other lack of coordination - Plan: PT plan of care cert/re-cert     Problem List Patient Active Problem List   Diagnosis Date Noted  . Milk allergy 09/02/2017  . Vitamin D deficiency 08/23/2017  . Iron deficiency anemia secondary to inadequate dietary iron intake 08/23/2017  . Abnormal urine odor 08/23/2017  . Atypical chest pain 08/23/2017  . Bloating 08/23/2017  .  Anxiety 08/23/2017  . Carotid stenosis, asymptomatic, bilateral 05/08/2017  . History of excessive cerumen 05/08/2017  . Cardiovascular risk factor 05/08/2017  . Gastritis and duodenitis 04/15/2017  . 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 Splenic artery aneurysm ([place]) 03/22/2017  . Vision changes 01/25/2017  . Hematuria 01/25/2017  . History of diverticulitis 01/25/2017  . Acute left lower quadrant pain 01/25/2017  . Exposure to chemical inhalation 11/06/2016  . Right knee pain 11/05/2016  . Former smoker 05/14/2016  . Hypopigmentation 01/11/2016  . Anaphylactic reaction due to food 01/11/2016  . Vaginal dryness 01/11/2016  . Multiple food allergies 01/11/2016  . 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 Carpal tunnel syndrome 10/17/2015  . Left arm numbness 10/13/2015  . Neck pain 10/13/2015  . Pernicious anemia 10/13/2015  . Chronically dry eyes 10/13/2015  . DDD (degenerative disc disease), cervical 08/31/2015  . Mid back pain on left side 07/20/2015  . Hair loss 07/20/2015  . Family history of renal cancer 07/20/2015  . History of shingles 07/19/2015  . Low iron stores 07/19/2015  . Thyroid activity decreased 07/19/2015  . Constipation 07/19/2015  . Hyperpigmentation of skin 07/19/2015  . Depression with anxiety 06/18/2015  . Normocytic anemia 06/18/2015  . Diverticulitis of large intestine with abscess without bleeding   . B12 deficiency 06/13/2015  . Migraine with aura and with status migrainosus, not intractable 04/20/2015  . Lung mass 04/20/2015  . Osteopenia 01/20/2014  . Seasonal and perennial allergic rhinitis 11/06/2011  . Diverticulosis of large intestine 07/03/2011  . Hyperlipidemia 12/13/2010  . ONYCHOMYCOSIS, TOENAILS 06/09/2010  . ANXIETY DEPRESSION 06/09/2010    Earlie Counts, PT 09/12/17 12:56 PM   [facility] [address] [place], [place], [address] Phone: [phone]   Fax:  [phone]  Name: MAYLA BIDDY MRN: [id number] Date of Birth: [birth year]

## 2017-09-13 ENCOUNTER — Other Ambulatory Visit: Payer: Self-pay | Admitting: *Deleted

## 2017-09-13 DIAGNOSIS — D510 Vitamin B12 deficiency anemia due to intrinsic factor deficiency: Secondary | ICD-10-CM

## 2017-09-13 DIAGNOSIS — D619 Aplastic anemia, unspecified: Secondary | ICD-10-CM

## 2017-09-13 MED ORDER — CYANOCOBALAMIN 1000 MCG/ML IJ SOLN: 1000.0000 ug | Freq: Once | INTRAMUSCULAR | 0 refills | Status: AC

## 2017-09-16 ENCOUNTER — Encounter: Payer: Self-pay | Admitting: Physician Assistant

## 2017-09-16 DIAGNOSIS — N8189 Other female genital prolapse: Secondary | ICD-10-CM | POA: Insufficient documentation

## 2017-09-16 DIAGNOSIS — R519 Headache, unspecified: Secondary | ICD-10-CM | POA: Insufficient documentation

## 2017-09-16 DIAGNOSIS — R51 Headache: Secondary | ICD-10-CM

## 2017-09-16 HISTORY — DX: Other female genital prolapse: N81.89

## 2017-09-19 ENCOUNTER — Encounter: Payer: Self-pay | Admitting: Physical Therapy

## 2017-09-19 ENCOUNTER — Ambulatory Visit: Payer: Medicaid Other | Attending: Physician Assistant | Admitting: Physical Therapy

## 2017-09-19 DIAGNOSIS — R278 Other lack of coordination: Secondary | ICD-10-CM | POA: Diagnosis present

## 2017-09-19 DIAGNOSIS — M6281 Muscle weakness (generalized): Secondary | ICD-10-CM | POA: Insufficient documentation

## 2017-09-19 DIAGNOSIS — M62838 Other muscle spasm: Secondary | ICD-10-CM | POA: Insufficient documentation

## 2017-09-19 NOTE — Patient Instructions (Addendum)
Moisturizers . They are used in the vagina to hydrate the mucous membrane that make up the vaginal canal. . Designed to keep a more normal acid balance (ph) . Once placed in the vagina, it will last between two to three days.  . Use 2-3 times per week at bedtime and last longer than 60 min. . Ingredients to avoid is glycerin and fragrance, can increase chance of infection . Should not be used just before sex due to causing irritation . Most are gels administered either in a tampon-shaped applicator or as a vaginal suppository. They are non-hormonal.   Types of Moisturizers . Samul Dada- drug store . Vitamin E vaginal suppositories- Whole foods, Amazon . Moist Again . Coconut oil- can break down condoms . Michail Jewels . Yes moisturizer- amazon . NeuEve Silk , NeuEve Silver for menopausal or over 65 (if have severe vaginal atrophy or cancer treatments use NeuEve Silk for  1 month than move to [employer])- [employer], MapleFlower.dk . Olive and Bee intimate cream- www.oliveandbee.com.au  Creams to use externally on the Vulva area  Albertson's (good for for cancer patients that had radiation to the area)- [place] or [employer].FlyingBasics.com.br  V-magic cream - amazon  Julva-amazon  Vital "V Wild Yam salve ( help moisturize and help with thinning vulvar area, does have Graham   Things to avoid in the vaginal area . Do not use things to irritate the vulvar area . No lotions just specialized creams for the vulva area- Neogyn, V-magic, No soaps; can use Aveeno or Calendula cleanser if needed. Must be gentle . No deodorants . No douches . Good to sleep without underwear to let the vaginal area to air out . No scrubbing: spread the lips to let warm water rinse over labias and pat dry   Chair Sitting    Sit at edge of seat, spine straight, one leg extended. Put a hand on each thigh and bend forward from the  hip, keeping spine straight. Allow hand on extended leg to reach toward toes. Support upper body with other arm. Hold _30__ seconds. Repeat _2__ times per session. Do __1_ sessions per day.  Copyright  VHI. All rights reserved.  Piriformis Stretch, Sitting    Sit, one ankle on opposite knee, same-side hand on crossed knee. Push down on knee, keeping spine straight. Lean torso forward, with flat back, until tension is felt in hamstrings and gluteals of crossed-leg side. Hold _30__ seconds.  Repeat _2__ times per session. Do _1__ sessions per day.  Copyright  VHI. All rights reserved.  About Abdominal Massage( Constipation)  Abdominal massage, also called external colon massage, is a self-treatment circular massage technique that can reduce and eliminate gas and ease constipation. The colon naturally contracts in waves in a clockwise direction starting from inside the right hip, moving up toward the ribs, across the belly, and down inside the left hip.  When you perform circular abdominal massage, you help stimulate your colon's normal wave pattern of movement called peristalsis.  It is most beneficial when done after eating.  Positioning You can practice abdominal massage with oil while lying down, or in the shower with soap.  Some people find that it is just as effective to do the massage through clothing while sitting or standing.  How to Massage Start by placing your finger tips or knuckles on your right side, just inside your hip bone.  . Make small circular  movements while you move upward toward your rib cage.   . Once you reach the bottom right side of your rib cage, take your circular movements across to the left side of the bottom of your rib cage.  . Next, move downward until you reach the inside of your left hip bone.  This is the path your feces travel in your colon. . Continue to perform your abdominal massage in this pattern for 10 minutes each day.     You can apply as much  pressure as is comfortable in your massage.  Start gently and build pressure as you continue to practice.  Notice any areas of pain as you massage; areas of slight pain may be relieved as you massage, but if you have areas of significant or intense pain, consult with your healthcare provider.  Other Considerations . General physical activity including bending and stretching can have a beneficial massage-like effect on the colon.  Deep breathing can also stimulate the colon because breathing deeply activates the same nervous system that supplies the colon.   . Abdominal massage should always be used in combination with a bowel-conscious diet that is high in the proper type of fiber for you, fluids (primarily water), and a regular exercise program. Butterfly, Supine    Lie on back, feet together. Lower knees toward floor. Hold __30_ seconds. Repeat _1__ times per session. Do __1_ sessions per day.  Copyright  VHI. All rights reserved.  [place] [address] [place], [place] [address] Phone # [phone] Fax [phone]

## 2017-09-19 NOTE — Therapy (Signed)
[facility] [address], [place], [place], [address] Phone: [phone]   Fax:  [phone]  Physical Therapy Treatment  Patient Details  Name: Jamie [name] MRN: [id number] Date of Birth: 06/08/1951 Referring Provider: Dr. [name], PA-C   Encounter Date: 09/19/2017  PT End of Session - 09/19/17 1531    Visit Number  2    Date for PT Re-Evaluation  10/10/17    Authorization Type  Medicaid    Authorization - Visit Number  2    Authorization - Number of Visits  4    PT Start Time  8299    PT Stop Time  1530    PT Time Calculation (min)  45 min    Activity Tolerance  Patient tolerated treatment well    Behavior During Therapy  [facility] for tasks assessed/performed       Past Medical History:  Diagnosis Date  . Anxiety   . Depression   . Diverticulitis   . Diverticulosis   . Headache   . Hyperlipidemia   . IBS (irritable bowel syndrome)   . Lung mass   . Syncope 03/08/2015    Past Surgical History:  Procedure Laterality Date  . ABDOMINAL HYSTERECTOMY    . HEMORRHOID SURGERY    . TUBAL LIGATION    . TUMOR REMOVAL     Left thigh     There were no vitals filed for this visit.  Subjective Assessment - 09/19/17 1450    Subjective  I have been feeling okay. I have not had headaches. I feel I have a leaky gut and Celiac.     How long can you sit comfortably?  pain level is 5-7/10 vaginal    How long can you stand comfortably?  pain level 4/10 lower vaginal    How long can you walk comfortably?  no pain    Patient Stated Goals  strengthen legs and pelvic floor to reduce urinary leakage    Currently in Pain?  Yes    Pain Score  5     Pain Location  Vagina    Pain Orientation  Mid    Pain Descriptors / Indicators  Sharp;Shooting;Stabbing    Pain Type  Chronic pain    Pain Onset  More than a month ago    Pain Frequency  Intermittent    Aggravating Factors   sitting, standing, lifting    Pain Relieving  Factors  hold her perineal area until she is able to sit down    Multiple Pain Sites  No                      [facility] Adult PT Treatment/Exercise - 09/19/17 0001      Self-Care   Self-Care  Other Self-Care Comments    Other Self-Care Comments   How to moisturizer the perineal area with coconut oil, not to scub the vaginal area and why      Manual Therapy   Manual Therapy  Soft tissue mobilization    Soft tissue mobilization  lower abdominal for scar massage, bil. levator ani in supine with hip movements outside of clothing             PT Education - 09/19/17 1525    Education provided  Yes    Education Details  vaginal moisturizers, abdominal massage, stretches, perineal care    Person(s) Educated  Patient    Methods  Explanation;Demonstration;Verbal cues;Handout    Comprehension  Returned demonstration;Verbalized understanding       PT Short Term Goals - 09/19/17 1529      PT SHORT TERM GOAL #1   Title  independent with initial HEP    Baseline  not educated yet    Time  4    Period  Weeks    Status  On-going      PT SHORT TERM GOAL #2   Title  ability to perform abdominal massage to improve mobility of intestines and reduction in pain    Baseline  not educated yet    Time  4    Period  Weeks    Status  Achieved      PT SHORT TERM GOAL #3   Title  understand how to toilet correctly to reduce strain on the pelvic floor     Baseline  not educated yet    Time  4    Period  Weeks    Status  New        PT Long Term Goals - 09/12/17 1249      PT LONG TERM GOAL #1   Title  independent with HEP and how to progress herself    Baseline  not educated yet    Time  4    Period  Weeks    Status  New    [place] Date  10/10/17      PT LONG TERM GOAL #2   Title  understand ways to manage pain and relax her muscles to improve function    Baseline  Not educated yet    Time  4    Period  Weeks    Status  New    Target Date  10/10/17      PT LONG TERM  GOAL #3   Title  when holding her urine pain in vagina decreases >/= 2/10 due to improved relaxation of the tissue    Baseline  pain is 6/10    Time  4    Period  Weeks    Status  New    Target Date  10/10/17      PT LONG TERM GOAL #4   Title  lower abdominal pain </= 3-4/10 due to ability to relax the pelvic floor muscles with sitting and standing    Baseline  pain level 8/10; left pelvic floor muscles are hypertonic    Time  4    Period  Weeks    Status  New    Target Date  10/10/17      PT LONG TERM GOAL #5   Title  urinary leakage with coughing and sneezing decreased to </= 1 time per month    Baseline  leaks urine every time    Time  4    Period  Weeks    Status  New    Target Date  10/10/17            Plan - 09/19/17 1523    Clinical Impression Statement  Patient understands how to perform abdominal and scar massage.  Patient has been scubbing her perineum 2 times per day due to wetness and now knows the wetness is urinary leakage.  Patient had trigger points in the bilateral levator ani area.  Pateint continues to have a sharp pain in the vaginal area. Patient will benefit from skilled therapy to improve mobility of tissue, improve strength and reduce pelvic floor pain.     Rehab Potential  Excellent    Clinical Impairments Affecting  Rehab Potential  Abdominal hysterectomy; Anxiety, Diverticulitis, IBS, and Hemorroic surgery    PT Frequency  1x / week    PT Duration  4 weeks    PT Treatment/Interventions  Cryotherapy;Electrical Stimulation;Moist Heat;Ultrasound;Therapeutic exercise;Therapeutic activities;Neuromuscular re-education;Patient/family education;Passive range of motion;Manual techniques;Dry needling;Energy conservation;Biofeedback    PT Next Visit Plan   how to perform manual perineal soft tissue work, pelvic floor contraction, abdominal contraction    PT Home Exercise Plan  progress as needed    Recommended Other Services  MD signed the initial eval     Consulted and Agree with Plan of Care  Patient       Patient will benefit from skilled therapeutic intervention in order to improve the following deficits and impairments:  Increased fascial restricitons, Pain, Decreased coordination, Decreased scar mobility, Decreased activity tolerance, Decreased endurance, Decreased strength  Visit Diagnosis: Other muscle spasm  Muscle weakness (generalized)  Other lack of coordination     Problem List Patient Active Problem List   Diagnosis Date Noted  . Pelvic floor weakness 09/16/2017  . Bilateral headaches 09/16/2017  . Milk allergy 09/02/2017  . Vitamin D deficiency 08/23/2017  . Iron deficiency anemia secondary to inadequate dietary iron intake 08/23/2017  . Abnormal urine odor 08/23/2017  . Atypical chest pain 08/23/2017  . Bloating 08/23/2017  . Anxiety 08/23/2017  . Carotid stenosis, asymptomatic, bilateral 05/08/2017  . History of excessive cerumen 05/08/2017  . Cardiovascular risk factor 05/08/2017  . Gastritis and duodenitis 04/15/2017  . 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 Splenic artery aneurysm ([place]) 03/22/2017  . Vision changes 01/25/2017  . Hematuria 01/25/2017  . History of diverticulitis 01/25/2017  . Acute left lower quadrant pain 01/25/2017  . Exposure to chemical inhalation 11/06/2016  . Right knee pain 11/05/2016  . Former smoker 05/14/2016  . Hypopigmentation 01/11/2016  . Anaphylactic reaction due to food 01/11/2016  . Vaginal dryness 01/11/2016  . Multiple food allergies 01/11/2016  . 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 Carpal tunnel syndrome 10/17/2015  . Left arm numbness 10/13/2015  . Neck pain 10/13/2015  . Pernicious anemia 10/13/2015  . Chronically dry eyes 10/13/2015  . DDD (degenerative disc disease), cervical 08/31/2015  . Mid back pain on left side 07/20/2015  . Hair loss 07/20/2015  . Family history of renal cancer 07/20/2015  . History of shingles 07/19/2015  . Low iron stores  07/19/2015  . Thyroid activity decreased 07/19/2015  . Constipation 07/19/2015  . Hyperpigmentation of skin 07/19/2015  . Depression with anxiety 06/18/2015  . Normocytic anemia 06/18/2015  . Diverticulitis of large intestine with abscess without bleeding   . B12 deficiency 06/13/2015  . Migraine with aura and with status migrainosus, not intractable 04/20/2015  . Lung mass 04/20/2015  . Osteopenia 01/20/2014  . Seasonal and perennial allergic rhinitis 11/06/2011  . Diverticulosis of large intestine 07/03/2011  . Hyperlipidemia 12/13/2010  . ONYCHOMYCOSIS, TOENAILS 06/09/2010  . ANXIETY DEPRESSION 06/09/2010    Earlie Counts, PT 09/19/17 3:31 PM   [facility] [address] [place], [place], [address] Phone: [phone]   Fax:  [phone]  Name: Jamie [name] MRN: [id number] Date of Birth: 01-16-1951

## 2017-09-20 ENCOUNTER — Telehealth: Payer: Self-pay

## 2017-09-20 NOTE — Telephone Encounter (Signed)
Tiea called and she would like to have labs for Celiac disease and "leaky gut".

## 2017-09-20 NOTE — Telephone Encounter (Signed)
I am ok with celiac panel to order with reflex. Leaky gut is not recognized by all mainstream medical professionals. There is a intestinal permeability test that can give some GI information. Follow up with GI to see if they have or have the capability of ordering.

## 2017-09-23 ENCOUNTER — Ambulatory Visit: Payer: Medicaid Other | Admitting: Sports Medicine

## 2017-09-23 NOTE — Telephone Encounter (Signed)
Left message advising of recommendations.  

## 2017-09-25 ENCOUNTER — Encounter: Payer: Self-pay | Admitting: Physical Therapy

## 2017-09-25 ENCOUNTER — Ambulatory Visit: Payer: Medicaid Other | Admitting: Physical Therapy

## 2017-09-25 DIAGNOSIS — M6281 Muscle weakness (generalized): Secondary | ICD-10-CM

## 2017-09-25 DIAGNOSIS — M62838 Other muscle spasm: Secondary | ICD-10-CM | POA: Diagnosis not present

## 2017-09-25 DIAGNOSIS — R278 Other lack of coordination: Secondary | ICD-10-CM

## 2017-09-25 NOTE — Patient Instructions (Addendum)
Toileting Techniques for Bowel Movements (Defecation) Using your belly (abdomen) and pelvic floor muscles to have a bowel movement is usually instinctive.  Sometimes people can have problems with these muscles and have to relearn proper defecation (emptying) techniques.  If you have weakness in your muscles, organs that are falling out, decreased sensation in your pelvis, or ignore your urge to go, you may find yourself straining to have a bowel movement.  You are straining if you are: . holding your breath or taking in a huge gulp of air and holding it  . keeping your lips and jaw tensed and closed tightly . turning red in the face because of excessive pushing or forcing . developing or worsening your  hemorrhoids . getting faint while pushing . not emptying completely and have to defecate many times a day  If you are straining, you are actually making it harder for yourself to have a bowel movement.  Many people find they are pulling up with the pelvic floor muscles and closing off instead of opening the anus. Due to lack pelvic floor relaxation and coordination the abdominal muscles, one has to work harder to push the feces out.  Many people have never been taught how to defecate efficiently and effectively.  Notice what happens to your body when you are having a bowel movement.  While you are sitting on the toilet pay attention to the following areas: . Jaw and mouth position . Angle of your hips   . Whether your feet touch the ground or not . Arm placement  . Spine position . Waist . Belly tension . Anus (opening of the anal canal)  An Evacuation/Defecation Plan   Here are the 4 basic points:  1. Lean forward enough for your elbows to rest on your knees 2. Support your feet on the floor or use a low stool if your feet don't touch the floor  3. Push out your belly as if you have swallowed a beach ball-you should feel a widening of your waist 4. Open and relax your pelvic floor muscles,  rather than tightening around the anus      The following conditions my require modifications to your toileting posture:  . If you have had surgery in the past that limits your back, hip, pelvic, knee or ankle flexibility . Constipation   Your healthcare practitioner may make the following additional suggestions and adjustments:  1) Sit on the toilet  a) Make sure your feet are supported. b) Notice your hip angle and spine position-most people find it effective to lean forward or raise their knees, which can help the muscles around the anus to relax  c) When you lean forward, place your forearms on your thighs for support  2) Relax suggestions a) Breath deeply in through your nose and out slowly through your mouth as if you are smelling the flowers and blowing out the candles. b) To become aware of how to relax your muscles, contracting and releasing muscles can be helpful.  Pull your pelvic floor muscles in tightly by using the image of holding back gas, or closing around the anus (visualize making a circle smaller) and lifting the anus up and in.  Then release the muscles and your anus should drop down and feel open. Repeat 5 times ending with the feeling of relaxation. c) Keep your pelvic floor muscles relaxed; let your belly bulge out. d) The digestive tract starts at the mouth and ends at the anal opening, so be   sure to relax both ends of the tube.  Place your tongue on the roof of your mouth with your teeth separated.  This helps relax your mouth and will help to relax the anus at the same time.  3) Empty (defecation) a) Keep your pelvic floor and sphincter relaxed, then bulge your anal muscles.  Make the anal opening wide.  b) Stick your belly out as if you have swallowed a beach ball. c) Make your belly wall hard using your belly muscles while continuing to breathe. Doing this makes it easier to open your anus. d) Breath out and give a grunt (or try using other sounds such as  ahhhh, shhhhh, ohhhh or grrrrrrr).  4) Finish a) As you finish your bowel movement, pull the pelvic floor muscles up and in.  This will leave your anus in the proper place rather than remaining pushed out and down. If you leave your anus pushed out and down, it will start to feel as though that is normal and give you incorrect signals about needing to have a bowel movement.     Sitting    Sit comfortably. Allow body's muscles to relax. Place hands on belly. Inhale slowly and deeply for _3-5__ seconds, so hands move out. Then take __3-5_ seconds to exhale. Repeat __10_ times. Prior to a bowel movement and Do __2_ times a day.  Copyright  VHI. All rights reserved.  Abdominal Bracing With Pelvic Floor (Hook-Lying)    With neutral spine, tighten pelvic floor and abdominals. Hold 5 sec. Repeat _10__ times. Do __1_ times a day.   Copyright  VHI. All rights reserved.    Bracing With Knee Fallout (Hook-Lying)    With neutral spine, tighten pelvic floor and abdominals and hold. Alternating legs, drop knee out to side. Keep opposite hip still. Repeat _10__ times. Do __1_ times a day.   Copyright  VHI. All rights reserved.  Bracing With Leg March (Hook-Lying)    With neutral spine, tighten pelvic floor and abdominals and hold. Alternating legs, lift foot __12_ inches and return to floor. Repeat _10__ times. Do _1__ times a day.   Copyright  VHI. All rights reserved.  Bracing With Bridging (Hook-Lying)    With neutral spine, tighten pelvic floor and abdominals and hold. Lift bottom. Repeat _15__ times. Do __1_ times a day.   Copyright  VHI. All rights reserved.  [facility] [address] [place], [place] [address] Phone # [phone] Fax [phone]

## 2017-09-25 NOTE — Therapy (Signed)
[facility] [address], [place], [place], [address] Phone: [phone]   Fax:  [phone]  Physical Therapy Treatment  Patient Details  Name: Jamie [name] MRN: [id number] Date of Birth: 07/13/1951 Referring Provider: Dr. [name], PA-C   Encounter Date: 09/25/2017  PT End of Session - 09/25/17 1401    Visit Number  3    Date for PT Re-Evaluation  10/10/17    Authorization Type  Medicaid    Authorization - Visit Number  3    Authorization - Number of Visits  4    PT Start Time  1400    PT Stop Time  1440    PT Time Calculation (min)  40 min    Activity Tolerance  Patient tolerated treatment well    Behavior During Therapy  [facility] for tasks assessed/performed       Past Medical History:  Diagnosis Date  . Anxiety   . Depression   . Diverticulitis   . Diverticulosis   . Headache   . Hyperlipidemia   . IBS (irritable bowel syndrome)   . Lung mass   . Syncope 03/08/2015    Past Surgical History:  Procedure Laterality Date  . ABDOMINAL HYSTERECTOMY    . HEMORRHOID SURGERY    . TUBAL LIGATION    . TUMOR REMOVAL     Left thigh     There were no vitals filed for this visit.  Subjective Assessment - 09/25/17 1404    Subjective  I am not feeling well. I had an echocardiagram. I have had a few shooting pains.  I am not having urine coming out but something else.  The doctor has tested the fluid and reported it was negative.     How long can you sit comfortably?  pain level is 5-7/10 vaginal    How long can you stand comfortably?  pain level 4/10 lower vaginal    How long can you walk comfortably?  no pain    Patient Stated Goals  strengthen legs and pelvic floor to reduce urinary leakage    Currently in Pain?  Yes    Pain Location  Vagina    Pain Orientation  Mid    Pain Descriptors / Indicators  Sharp;Shooting;Stabbing    Pain Type  Chronic pain    Pain Onset  More than a month ago    Pain Frequency   Intermittent    Aggravating Factors   sitting, standing, lifting    Pain Relieving Factors  hold her perineal area until she is able to sit down    Multiple Pain Sites  No                      [facility] Adult PT Treatment/Exercise - 09/25/17 0001      Therapeutic Activites    Therapeutic Activities  ADL's    ADL's  taught patient how to have a bowel with diaphgramatic breathing and relaxation of the anal sphincter             PT Education - 09/25/17 1439    Education provided  Yes    Education Details  toileting technique, pelvic floor with abdominal exercise    Person(s) Educated  Patient    Methods  Explanation;Demonstration;Verbal cues;Handout    Comprehension  Verbalized understanding;Returned demonstration       PT Short Term Goals - 09/25/17 1410      PT SHORT TERM GOAL #1  Title  independent with initial HEP    Time  4    Period  Weeks    Status  Achieved      PT SHORT TERM GOAL #2   Title  ability to perform abdominal massage to improve mobility of intestines and reduction in pain    Time  4    Period  Weeks    Status  Achieved      PT SHORT TERM GOAL #3   Title  understand how to toilet correctly to reduce strain on the pelvic floor     Time  4    Period  Weeks    Status  Achieved        PT Long Term Goals - 09/25/17 1421      PT LONG TERM GOAL #5   Title  urinary leakage with coughing and sneezing decreased to </= 1 time per month    Baseline  improved by 25%    Time  4    Period  Weeks    Status  On-going            Plan - 09/25/17 1422    Clinical Impression Statement  Patient reports her urinary leakage has improved by 25%.  Patient is consistent with her HEP.  Patient pelvis in correct alignment.  Patient understands how to have a bowel movement correctly.  Patient had an echocardiogram to assess her heart yesterday.  Patient needs tactile cues to contract her lower abdominals during exercise.  Patient needed tactile and  verbal cues to perform diaphragmatic breathing. .Patient will benefit from skilled therapy to improve mobility of tissue, improve strength and reduce pelvic floor pain.     Rehab Potential  Excellent    Clinical Impairments Affecting Rehab Potential  Abdominal hysterectomy; Anxiety, Diverticulitis, IBS, and Hemorroic surgery    PT Frequency  1x / week    PT Duration  4 weeks    PT Treatment/Interventions  Cryotherapy;Electrical Stimulation;Moist Heat;Ultrasound;Therapeutic exercise;Therapeutic activities;Neuromuscular re-education;Patient/family education;Passive range of motion;Manual techniques;Dry needling;Energy conservation;Biofeedback    PT Next Visit Plan   how to perform manual perineal soft tissue work, pelvic floor contraction, abdominal contraction; renewal note    PT Home Exercise Plan  progress as needed    Consulted and Agree with Plan of Care  Patient       Patient will benefit from skilled therapeutic intervention in order to improve the following deficits and impairments:  Increased fascial restricitons, Pain, Decreased coordination, Decreased scar mobility, Decreased activity tolerance, Decreased endurance, Decreased strength  Visit Diagnosis: Other muscle spasm  Muscle weakness (generalized)  Other lack of coordination     Problem List Patient Active Problem List   Diagnosis Date Noted  . Pelvic floor weakness 09/16/2017  . Bilateral headaches 09/16/2017  . Milk allergy 09/02/2017  . Vitamin D deficiency 08/23/2017  . Iron deficiency anemia secondary to inadequate dietary iron intake 08/23/2017  . Abnormal urine odor 08/23/2017  . Atypical chest pain 08/23/2017  . Bloating 08/23/2017  . Anxiety 08/23/2017  . Carotid stenosis, asymptomatic, bilateral 05/08/2017  . History of excessive cerumen 05/08/2017  . Cardiovascular risk factor 05/08/2017  . Gastritis and duodenitis 04/15/2017  . 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 Splenic artery aneurysm ([place]) 03/22/2017   . Vision changes 01/25/2017  . Hematuria 01/25/2017  . History of diverticulitis 01/25/2017  . Acute left lower quadrant pain 01/25/2017  . Exposure to chemical inhalation 11/06/2016  . Right knee pain 11/05/2016  . Former smoker 05/14/2016  .  Hypopigmentation 01/11/2016  . Anaphylactic reaction due to food 01/11/2016  . Vaginal dryness 01/11/2016  . Multiple food allergies 01/11/2016  . 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 Carpal tunnel syndrome 10/17/2015  . Left arm numbness 10/13/2015  . Neck pain 10/13/2015  . Pernicious anemia 10/13/2015  . Chronically dry eyes 10/13/2015  . DDD (degenerative disc disease), cervical 08/31/2015  . Mid back pain on left side 07/20/2015  . Hair loss 07/20/2015  . Family history of renal cancer 07/20/2015  . History of shingles 07/19/2015  . Low iron stores 07/19/2015  . Thyroid activity decreased 07/19/2015  . Constipation 07/19/2015  . Hyperpigmentation of skin 07/19/2015  . Depression with anxiety 06/18/2015  . Normocytic anemia 06/18/2015  . Diverticulitis of large intestine with abscess without bleeding   . B12 deficiency 06/13/2015  . Migraine with aura and with status migrainosus, not intractable 04/20/2015  . Lung mass 04/20/2015  . Osteopenia 01/20/2014  . Seasonal and perennial allergic rhinitis 11/06/2011  . Diverticulosis of large intestine 07/03/2011  . Hyperlipidemia 12/13/2010  . ONYCHOMYCOSIS, TOENAILS 06/09/2010  . ANXIETY DEPRESSION 06/09/2010    Earlie Counts, PT 09/25/17 2:44 PM   [facility] [address] [place], [place], [address] Phone: [phone]   Fax:  [phone]  Name: Jamie [name] MRN: [id number] Date of Birth: 1951-02-08

## 2017-09-26 ENCOUNTER — Encounter: Payer: Medicaid Other | Admitting: Physical Therapy

## 2017-10-01 ENCOUNTER — Ambulatory Visit (INDEPENDENT_AMBULATORY_CARE_PROVIDER_SITE_OTHER): Payer: Medicaid Other | Admitting: Physician Assistant

## 2017-10-01 ENCOUNTER — Encounter: Payer: Self-pay | Admitting: Physician Assistant

## 2017-10-01 VITALS — BP 111/54 | HR 71 | Ht 64.0 in | Wt 149.0 lb

## 2017-10-01 DIAGNOSIS — R790 Abnormal level of blood mineral: Secondary | ICD-10-CM | POA: Diagnosis not present

## 2017-10-01 DIAGNOSIS — M5441 Lumbago with sciatica, right side: Secondary | ICD-10-CM | POA: Diagnosis not present

## 2017-10-01 DIAGNOSIS — M5442 Lumbago with sciatica, left side: Secondary | ICD-10-CM | POA: Diagnosis not present

## 2017-10-01 DIAGNOSIS — E782 Mixed hyperlipidemia: Secondary | ICD-10-CM | POA: Diagnosis not present

## 2017-10-01 DIAGNOSIS — R0789 Other chest pain: Secondary | ICD-10-CM | POA: Diagnosis not present

## 2017-10-01 DIAGNOSIS — R1084 Generalized abdominal pain: Secondary | ICD-10-CM
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
## 2017-10-01 NOTE — Patient Instructions (Addendum)
Allergy referral Please see cardiologist for myoview scan Get labs

## 2017-10-02 ENCOUNTER — Ambulatory Visit: Payer: Medicaid Other | Admitting: Physical Therapy

## 2017-10-02 ENCOUNTER — Encounter: Payer: Self-pay | Admitting: Physical Therapy

## 2017-10-02 DIAGNOSIS — M62838 Other muscle spasm: Secondary | ICD-10-CM | POA: Diagnosis not present

## 2017-10-02 DIAGNOSIS — M6281 Muscle weakness (generalized): Secondary | ICD-10-CM

## 2017-10-02 DIAGNOSIS — R278 Other lack of coordination: Secondary | ICD-10-CM

## 2017-10-02 NOTE — Progress Notes (Signed)
Call pt: iron stores are improving but only slightly. It has been one month. Continue with iron rich foods.   Celiac still pending.
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
## 2017-10-03 ENCOUNTER — Ambulatory Visit: Payer: Medicaid Other | Admitting: Physical Therapy

## 2017-10-04 LAB — RETICULIN ANTIBODIES, IGA W TITER: RETICULIN IGA SCREEN: NEGATIVE

## 2017-10-04 LAB — IRON,TIBC AND FERRITIN PANEL
%SAT: 19 % (calc) (ref 11–50)
Ferritin: 17 ng/mL — ABNORMAL LOW (ref 20–288)
IRON: 71 ug/dL (ref 45–160)
TIBC: 380 ug/dL (ref 250–450)

## 2017-10-04 NOTE — Progress Notes (Signed)
Celiac negative

## 2017-10-07 ENCOUNTER — Other Ambulatory Visit: Payer: Self-pay | Admitting: Physician Assistant

## 2017-10-07 ENCOUNTER — Telehealth: Payer: Self-pay | Admitting: Family Medicine

## 2017-10-07 ENCOUNTER — Encounter: Payer: Self-pay | Admitting: Physician Assistant

## 2017-10-07 DIAGNOSIS — M5442 Lumbago with sciatica, left side: Secondary | ICD-10-CM | POA: Insufficient documentation

## 2017-10-07 DIAGNOSIS — Z91011 Allergy to milk products: Secondary | ICD-10-CM

## 2017-10-07 DIAGNOSIS — M5441 Lumbago with sciatica, right side: Secondary | ICD-10-CM | POA: Insufficient documentation

## 2017-10-07 DIAGNOSIS — R1084 Generalized abdominal pain: Secondary | ICD-10-CM

## 2017-10-07 HISTORY — DX: Lumbago with sciatica, right side: M54.41

## 2017-10-07 NOTE — Telephone Encounter (Signed)
Was this pt calling? Xray of what? Abdomen?

## 2017-10-07 NOTE — Telephone Encounter (Signed)
Allergy referral placed. I did not know anything about dietition. Insurance will not usually pay for it unless one is diabetic. I will place though.

## 2017-10-07 NOTE — Telephone Encounter (Signed)
Pt called and stated she has not heard anything for the allergy referral and dietician referral. I looked in her chart and did not see them placed. Can we get these referrals placed so patient can get scheduled. She stated she is going to call her cardiologist about the myoview scan if an order can also be placed for that. Thanks

## 2017-10-08 NOTE — Telephone Encounter (Signed)
Called pt and let her know that the referrals have been placed and she will receive a call to schedule them once the referrals are processed. Thanks

## 2017-10-14 ENCOUNTER — Ambulatory Visit: Payer: Medicaid Other | Admitting: Physical Therapy
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
## 2017-10-21 ENCOUNTER — Ambulatory Visit: Payer: Medicaid Other | Attending: Physician Assistant | Admitting: Physical Therapy

## 2017-10-21 DIAGNOSIS — M62838 Other muscle spasm: Secondary | ICD-10-CM | POA: Diagnosis present

## 2017-10-21 DIAGNOSIS — R278 Other lack of coordination: Secondary | ICD-10-CM | POA: Diagnosis present

## 2017-10-21 DIAGNOSIS — M6281 Muscle weakness (generalized): Secondary | ICD-10-CM | POA: Diagnosis present

## 2017-10-21 NOTE — Therapy (Signed)
[facility] [address] [place], [place], [address] Phone: [phone]   Fax:  [phone]  Physical Therapy Treatment  Patient Details  Name: Jamie [name] MRN: [id number] Date of Birth: 09/28/1950 Referring Provider: Dr. [name]   Encounter Date: 10/21/2017  PT End of Session - 10/21/17 1438    Visit Number  4    Date for PT Re-Evaluation  12/13/17    Authorization Type  Medicaid    Authorization Time Period  3/4-5/5    Authorization - Visit Number  1    Authorization - Number of Visits  9    PT Start Time  1400    PT Stop Time  1439    PT Time Calculation (min)  39 min    Activity Tolerance  Patient tolerated treatment well    Behavior During Therapy  [facility] for tasks assessed/performed       Past Medical History:  Diagnosis Date  . Anxiety   . Depression   . Diverticulitis   . Diverticulosis   . Headache   . Hyperlipidemia   . IBS (irritable bowel syndrome)   . Lung mass   . Syncope 03/08/2015    Past Surgical History:  Procedure Laterality Date  . ABDOMINAL HYSTERECTOMY    . HEMORRHOID SURGERY    . TUBAL LIGATION    . TUMOR REMOVAL     Left thigh     There were no vitals filed for this visit.  Subjective Assessment - 10/21/17 1404    Subjective  Urinary leakage is 50% better. I am doing pretty good.  I have pain in the upper abdomen. I have some pain in the vaginal area.  I have to push on the right side to get the bowel out. I have got back on my Aloe Vera juice that helps with the hard stool.     How long can you sit comfortably?  pain level is 5-7/10 vaginal    How long can you stand comfortably?  pain level 4/10 lower vaginal    How long can you walk comfortably?  no pain    Patient Stated Goals  strengthen legs and pelvic floor to reduce urinary leakage    Currently in Pain?  Yes    Pain Score  4     Pain Location  Vagina    Pain Orientation  Mid    Pain Descriptors / Indicators   Stabbing;Shooting;Sharp    Pain Type  Chronic pain    Pain Onset  More than a month ago    Pain Frequency  Intermittent    Aggravating Factors   when she has to urinate, sitting, standing, lifting    Pain Relieving Factors  hold her perineal area until she is able to sit down    Multiple Pain Sites  No                      [facility] Adult PT Treatment/Exercise - 10/21/17 0001      Lumbar Exercises: Supine   Clam  20 reps;1 second work on pelvic floor contraction and control    Heel Slides  20 reps pelvic floor contraction with control    Heel Slides Limitations  right side caused burning of muscles    Bent Knee Raise  20 reps;1 second pelvic floor contraction with control    Bridge  15 reps;1 second    Bridge with [employer]  15 reps  Manual Therapy   Manual Therapy  Soft tissue mobilization    Soft tissue mobilization  abdominal massage to promote peristalic motion                PT Short Term Goals - 09/25/17 1410      PT SHORT TERM GOAL #1   Title  independent with initial HEP    Time  4    Period  Weeks    Status  Achieved      PT SHORT TERM GOAL #2   Title  ability to perform abdominal massage to improve mobility of intestines and reduction in pain    Time  4    Period  Weeks    Status  Achieved      PT SHORT TERM GOAL #3   Title  understand how to toilet correctly to reduce strain on the pelvic floor     Time  4    Period  Weeks    Status  Achieved        PT Long Term Goals - 10/21/17 1442      PT LONG TERM GOAL #1   Title  independent with HEP and how to progress herself    Baseline  still learning    Time  9    Period  Weeks    Status  On-going      PT LONG TERM GOAL #2   Title  understand ways to manage pain and relax her muscles to improve function    Baseline  Not educated yet    Time  9    Period  Weeks    Status  On-going      PT LONG TERM GOAL #3   Title  when holding her urine pain in vagina decreases >/= 2/10  due to improved relaxation of the tissue    Time  9    Period  Weeks    Status  On-going      PT LONG TERM GOAL #4   Title  lower abdominal pain </= 3-4/10 due to ability to relax the pelvic floor muscles with sitting and standing    Baseline  pain level 5/10; left pelvic floor muscles are hypertonic    Time  4    Period  Weeks    Status  On-going      PT LONG TERM GOAL #5   Title  urinary leakage with coughing and sneezing decreased to </= 1 time per month    Baseline  No urinary leakage    Time  4    Period  Weeks    Status  Achieved            Plan - 10/21/17 1401    Clinical Impression Statement  Patient reports less urinary leakage.  Patient needed verbal cues to perform abdominal exercises with control and contracting the pelvic floor .  Patient had firmness in the abdomen due to not have a full bowel movement in a few days and after manual work was soft with good bowel sounds. Patient has to push on her right side of anus to assist bowels to come out.  Patient will benefit from skilled therapy to improve mobility of tissue, improve strength and reduce pelvic floor pain.     Clinical Impairments Affecting Rehab Potential  Abdominal hysterectomy; Anxiety, Diverticulitis, IBS, and Hemorroic surgery    PT Frequency  1x / week    PT Duration  Other (comment) 9 weeks  PT Treatment/Interventions  Cryotherapy;Electrical Stimulation;Moist Heat;Ultrasound;Therapeutic exercise;Therapeutic activities;Neuromuscular re-education;Patient/family education;Passive range of motion;Manual techniques;Dry needling;Energy conservation;Biofeedback    PT Next Visit Plan  soft tissue work to left levator ani internally, abdominal strength with pelvic floor contraction; nustep.  gym exercises    PT Home Exercise Plan  progress as needed    Recommended Other Services  MD signed the renewal note    Consulted and Agree with Plan of Care  Patient       Patient will benefit from skilled therapeutic  intervention in order to improve the following deficits and impairments:  Increased fascial restricitons, Pain, Decreased coordination, Decreased scar mobility, Decreased activity tolerance, Decreased endurance, Decreased strength  Visit Diagnosis: Other muscle spasm  Muscle weakness (generalized)  Other lack of coordination     Problem List Patient Active Problem List   Diagnosis Date Noted  . Acute bilateral low back pain with bilateral sciatica 10/07/2017  . Pelvic floor weakness 09/16/2017  . Bilateral headaches 09/16/2017  . Milk allergy 09/02/2017  . Vitamin D deficiency 08/23/2017  . Iron deficiency anemia secondary to inadequate dietary iron intake 08/23/2017  . Abnormal urine odor 08/23/2017  . Atypical chest pain 08/23/2017  . Bloating 08/23/2017  . Anxiety 08/23/2017  . Carotid stenosis, asymptomatic, bilateral 05/08/2017  . History of excessive cerumen 05/08/2017  . Cardiovascular risk factor 05/08/2017  . Gastritis and duodenitis 04/15/2017  . 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 Splenic artery aneurysm ([place]) 03/22/2017  . Vision changes 01/25/2017  . Hematuria 01/25/2017  . History of diverticulitis 01/25/2017  . Acute left lower quadrant pain 01/25/2017  . Exposure to chemical inhalation 11/06/2016  . Right knee pain 11/05/2016  . Former smoker 05/14/2016  . Hypopigmentation 01/11/2016  . Anaphylactic reaction due to food 01/11/2016  . Vaginal dryness 01/11/2016  . Multiple food allergies 01/11/2016  . 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 Carpal tunnel syndrome 10/17/2015  . Left arm numbness 10/13/2015  . Neck pain 10/13/2015  . Pernicious anemia 10/13/2015  . Chronically dry eyes 10/13/2015  . DDD (degenerative disc disease), cervical 08/31/2015  . Mid back pain on left side 07/20/2015  . Hair loss 07/20/2015  . Family history of renal cancer 07/20/2015  . History of shingles 07/19/2015  . Low iron stores 07/19/2015  . Thyroid activity  decreased 07/19/2015  . Constipation 07/19/2015  . Hyperpigmentation of skin 07/19/2015  . Depression with anxiety 06/18/2015  . Normocytic anemia 06/18/2015  . Diverticulitis of large intestine with abscess without bleeding   . B12 deficiency 06/13/2015  . Migraine with aura and with status migrainosus, not intractable 04/20/2015  . Lung mass 04/20/2015  . Generalized abdominal pain 01/24/2014  . Osteopenia 01/20/2014  . Seasonal and perennial allergic rhinitis 11/06/2011  . Diverticulosis of large intestine 07/03/2011  . Hyperlipidemia 12/13/2010  . ONYCHOMYCOSIS, TOENAILS 06/09/2010  . ANXIETY DEPRESSION 06/09/2010    Earlie Counts, PT 10/21/17 2:43 PM   [facility] [address] [place], [place], [address] Phone: [phone]   Fax:  [phone]  Name: HEYLEE TANT MRN: [id number] Date of Birth: 01/16/1951

## 2017-10-23 ENCOUNTER — Ambulatory Visit (INDEPENDENT_AMBULATORY_CARE_PROVIDER_SITE_OTHER): Payer: Medicaid Other | Admitting: Allergy & Immunology

## 2017-10-23 ENCOUNTER — Encounter: Payer: Self-pay | Admitting: Allergy & Immunology

## 2017-10-23 VITALS — BP 122/72 | HR 81 | Temp 98.1°F | Resp 16 | Ht 64.0 in | Wt 150.8 lb

## 2017-10-23 DIAGNOSIS — L298 Other pruritus: Secondary | ICD-10-CM | POA: Diagnosis not present

## 2017-10-23 DIAGNOSIS — T7800XD Anaphylactic reaction due to unspecified food, subsequent encounter: Secondary | ICD-10-CM

## 2017-10-23 DIAGNOSIS — R059 Cough, unspecified: Secondary | ICD-10-CM

## 2017-10-23 DIAGNOSIS — T781XXD Other adverse food reactions, not elsewhere classified, subsequent encounter: Secondary | ICD-10-CM

## 2017-10-23 DIAGNOSIS — B999 Unspecified infectious disease: Secondary | ICD-10-CM

## 2017-10-23 DIAGNOSIS — E611 Iron deficiency: Secondary | ICD-10-CM

## 2017-10-23 DIAGNOSIS — R05 Cough: Secondary | ICD-10-CM | POA: Diagnosis not present

## 2017-10-23 MED ORDER — EPINEPHRINE 0.3 MG/0.3ML IJ SOAJ: INTRAMUSCULAR | 1 refills | Status: DC

## 2017-10-23 NOTE — Patient Instructions (Addendum)
1. Adverse food reaction - You testing for food IgE showed only a barely positive level to milk, but since you are eating cheese without a problem. - The IgG panel to foods is not indicative of a food allergy and in fact only shows which foods you have been eating. - Those results can be ignored completely, so you can introduce those foods back into your diet. - We will get testing to look for a seafood allergy, and if the testing is negative we can bring you in to watch you eat shrimp.  - Be sure to fill your EpiPen (epinephrine), which does NOT contain iodine. - We cannot prescribe AuviQ since you have Medicaid unfortunately.  - We will order a fecal occult blood to make sure that you are not losing blood in your stool (they will give you some cards to use at home).  2. Concern for an immune problem - She has a history of recurrent infections, in particular sinus infections and pnuemonia. - We will obtain some screening labs to evaluate her immune system.  - Labs to evaluate the quantitative aspects of her immune system: IgG/IgA/IgM, CBC with differential - Labs to evaluate the qualitative aspects of her immune system: CH50, Pneumococcal titers, Tetanus titers, Diphtheria titers - We may consider immunizations with Pneumovax and Tdap to challenge her immune system, and then obtain repeat titers in 4-6 weeks.  - We may consider obtaining flow cytometry at future visits, if indicated.   3. Nasal rhinitis - We will get an environmental allergy panel to look for environmental IgE (allergy levels). - We will call you in 1-2 weeks with the results of the testing.   4. Return in about 6 months (around 04/25/2018).   Please inform us of any Emergency Department visits, hospitalizations, or changes in symptoms. Call us before going to the ED for breathing or allergy symptoms since we might be able to fit you in for a sick visit. Feel free to contact us anytime with any questions, problems, or  concerns.  It was a pleasure to meet you today!  Websites that have reliable patient information: 1. American Academy of Asthma, Allergy, and Immunology: www.aaaai.org 2. Food Allergy Research and Education (FARE): foodallergy.org 3. Mothers of Asthmatics: http://www.asthmacommunitynetwork.org 4. American College of Allergy, Asthma, and Immunology: www.acaai.org

## 2017-10-23 NOTE — Progress Notes (Signed)
NEW PATIENT  Date of Service/Encounter:  10/23/17  Referring provider: [name], PA-C   Assessment:   Adverse food reaction  Concern for immunodeficiency   Nasal itching   Cough - ? 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 Adverse food reaction - Jamie [name]'s testing for food IgE showed only a barely positive level to milk, but since she are eating cheese without a problem this is a false positive.  - The IgG panel to foods is not indicative of a food allergy and in fact only shows which foods Jamie [name] has been eating. - Those results can be ignored completely, as they are not clinically relevant, so I did tell Jamie [name] that she can introduce those foods back into her diet. - We will get testing to look for a seafood allergy, and if the testing is negative we can bring her in for an office based food challenge.   - Be sure to fill your EpiPen (epinephrine), which I assured her does NOT contain iodine - We will order a fecal occult blood to make sure that Jamie [name] is not losing blood in her stool (as a cause of her self-reported anemia).   2. Concern for an immune problem   - She has a history of recurrent infections, in particular urinary tract infections.  - It should be noted that there are no immunodeficiencies associated with recurrent UTIs, so this workup will likely be normal.  - We will obtain some screening labs to evaluate her immune system.  - Labs to evaluate the quantitative aspects of her immune system: IgG/IgA/IgM, CBC with differential - Labs to evaluate the qualitative aspects of her immune system: CH50, Pneumococcal titers, Tetanus titers, Diphtheria titers - We may consider immunizations with Pneumovax and Tdap to challenge her immune system, and then obtain repeat titers in 4-6 weeks.  - We may consider obtaining flow cytometry at future visits, if indicated.   3. Nasal  rhinitis - We will get an environmental allergy panel to look for environmental IgE (allergy levels). - We will call you in 1-2 weeks with the results of the testing.   4. Return in about 6 months (around 04/25/2018).   Subjective:   Jamie [name] is a 67 y.o. female presenting today for evaluation of  Chief Complaint  Patient presents with  . Allergic Reaction  . Food Intolerance    Jamie [name] has a history of the following: Patient Active Problem List   Diagnosis Date Noted  . Acute bilateral low back pain with bilateral sciatica 10/07/2017  . Pelvic floor weakness 09/16/2017  . Bilateral headaches 09/16/2017  . Milk allergy 09/02/2017  . Vitamin D deficiency 08/23/2017  . Iron deficiency anemia secondary to inadequate dietary iron intake 08/23/2017  . Abnormal urine odor 08/23/2017  . Atypical chest pain 08/23/2017  . Bloating 08/23/2017  . Anxiety 08/23/2017  . Carotid stenosis, asymptomatic, bilateral 05/08/2017  . History of excessive cerumen 05/08/2017  . Cardiovascular risk factor 05/08/2017  . Gastritis and duodenitis 04/15/2017  . 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 Splenic artery aneurysm ([place]) 03/22/2017  . Vision changes 01/25/2017  . Hematuria 01/25/2017  . History of diverticulitis 01/25/2017  . Acute left lower quadrant pain 01/25/2017  . Exposure to chemical inhalation 11/06/2016  . Right knee pain 11/05/2016  . Former smoker 05/14/2016  . Hypopigmentation 01/11/2016  . Anaphylactic reaction due to food 01/11/2016  .  Vaginal dryness 01/11/2016  . Multiple food allergies 01/11/2016  . 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 Carpal tunnel syndrome 10/17/2015  . Left arm numbness 10/13/2015  . Neck pain 10/13/2015  . Pernicious anemia 10/13/2015  . Chronically dry eyes 10/13/2015  . DDD (degenerative disc disease), cervical 08/31/2015  . Mid back pain on left side 07/20/2015  . Hair loss 07/20/2015  . Family history of renal cancer 07/20/2015    . History of shingles 07/19/2015  . Low iron stores 07/19/2015  . Thyroid activity decreased 07/19/2015  . Constipation 07/19/2015  . Hyperpigmentation of skin 07/19/2015  . Depression with anxiety 06/18/2015  . Normocytic anemia 06/18/2015  . Diverticulitis of large intestine with abscess without bleeding   . B12 deficiency 06/13/2015  . Migraine with aura and with status migrainosus, not intractable 04/20/2015  . Lung mass 04/20/2015  . Generalized abdominal pain 01/24/2014  . Osteopenia 01/20/2014  . Seasonal and perennial allergic rhinitis 11/06/2011  . Diverticulosis of large intestine 07/03/2011  . Hyperlipidemia 12/13/2010  . ONYCHOMYCOSIS, TOENAILS 06/09/2010  . ANXIETY DEPRESSION 06/09/2010    History obtained from: chart review and patient.  Jamie [name] was referred by [name], PA-C.     Jamie [name] is a 67 y.o. female presenting for an evaluation of food allergies. She had testing performed and was told that she was allergic to all of the foods that she is eating. It is not clear at all why she had the testing done, but from my visit with Jamie [name] it seems that she probably demanded it. In any case, she does have "gut problems" and has a history of gastritis and diverticulitis. She does see a gastroenterologist (Dr. Ardis Hughs), whom she sees as needed. She does get regular colonoscopies and endoscopies.   Food IgE Testing:    Food IgG Testing:   She eats all of these foods without a problem at all. Now she is avoiding all of them, and now she feels that "something is in her throat", although she is unsure of whether these are food mediated. She does admit to me today that she "might be having anxiety". She has never had an anaphylactic reaction to any of the most common foods aside from shellfish (peanut, tree nuts, soy, fish mix, wheat, milk, egg, sesame), including wheezing, urticaria, abdominal pain, diarrhea, or throat swelling. She tolerates all of the major food  allergens, but has removed them from her diet since the IgG testing resulted. At one point, she was eating shellfish three times weekly and then she developed itching after eating shellfish. She has avoided all fish and shellfish since that time. Dr. Annamaria Boots (Pulmonology) did do testing for shellfish, which was negative.     She tells me that she has :"autoimmune disease" although she is not able to clarify this. She has never seen a Rheumatologist. It seems that she Googles her symptoms and likes to find problems, from what she is telling me today. She also reports that her PA has told her that she had elevated inflammatory markers, although I cannot see any elevated inflammatory markers during the visit.   She does not get sick often, but she does have frequent urinary tract infections. The last time that she got antibiotics was when she had E coli bladder infection. She would like to have an immune evaluation because of these recurrent UTIs.   Asthma/Respiratory Symptom History: She denies a history of asthma, but tells me that she does have an albuterol  inhaler. She does have a remote history of smoking. She has not needed prednise in quite some time.   Allergic Rhinitis Symptom History:  She does report that she has symptoms of mold allergy. She did have a dishwasher leak which resulted in a lot of restructuring for a lot of mold issue. She denies chronic stuffy nose or ocular systems. She does have not nose itching. She does not use antihistamines or nasal sprays. Extensive review of her chart shows that she was actually seen by Dr. Annamaria Boots (Pulmonology) at one point who did skin testing to environmental allergens that was positive to grasses, weeds, trees, and dust mites via intradermal testing.   She does have a history of a lung nodule that is being followed by Pulmonology. She is having annual CT scans to follow this. She had thyroid problems at some point, but evidently per the patient this  resolved.   Otherwise, there is no history of other atopic diseases, including stinging insect allergies or urticaria. There is no significant infectious history aside from the UTIs. Vaccinations are up to date.    Past Medical History: Patient Active Problem List   Diagnosis Date Noted  . Acute bilateral low back pain with bilateral sciatica 10/07/2017  . Pelvic floor weakness 09/16/2017  . Bilateral headaches 09/16/2017  . Milk allergy 09/02/2017  . Vitamin D deficiency 08/23/2017  . Iron deficiency anemia secondary to inadequate dietary iron intake 08/23/2017  . Abnormal urine odor 08/23/2017  . Atypical chest pain 08/23/2017  . Bloating 08/23/2017  . Anxiety 08/23/2017  . Carotid stenosis, asymptomatic, bilateral 05/08/2017  . History of excessive cerumen 05/08/2017  . Cardiovascular risk factor 05/08/2017  . Gastritis and duodenitis 04/15/2017  . 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 Splenic artery aneurysm ([place]) 03/22/2017  . Vision changes 01/25/2017  . Hematuria 01/25/2017  . History of diverticulitis 01/25/2017  . Acute left lower quadrant pain 01/25/2017  . Exposure to chemical inhalation 11/06/2016  . Right knee pain 11/05/2016  . Former smoker 05/14/2016  . Hypopigmentation 01/11/2016  . Anaphylactic reaction due to food 01/11/2016  . Vaginal dryness 01/11/2016  . Multiple food allergies 01/11/2016  . 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 Carpal tunnel syndrome 10/17/2015  . Left arm numbness 10/13/2015  . Neck pain 10/13/2015  . Pernicious anemia 10/13/2015  . Chronically dry eyes 10/13/2015  . DDD (degenerative disc disease), cervical 08/31/2015  . Mid back pain on left side 07/20/2015  . Hair loss 07/20/2015  . Family history of renal cancer 07/20/2015  . History of shingles 07/19/2015  . Low iron stores 07/19/2015  . Thyroid activity decreased 07/19/2015  . Constipation 07/19/2015  . Hyperpigmentation of skin 07/19/2015  . Depression with anxiety  06/18/2015  . Normocytic anemia 06/18/2015  . Diverticulitis of large intestine with abscess without bleeding   . B12 deficiency 06/13/2015  . Migraine with aura and with status migrainosus, not intractable 04/20/2015  . Lung mass 04/20/2015  . Generalized abdominal pain 01/24/2014  . Osteopenia 01/20/2014  . Seasonal and perennial allergic rhinitis 11/06/2011  . Diverticulosis of large intestine 07/03/2011  . Hyperlipidemia 12/13/2010  . ONYCHOMYCOSIS, TOENAILS 06/09/2010  . ANXIETY DEPRESSION 06/09/2010    Medication List:  Allergies as of 10/23/2017      Reactions   Iodine Rash   Ciprofloxacin    Facial numbness   Iodinated Diagnostic Agents Other (See Comments)   Face and chest felt "hot", throat felt tight.    Peanut Butter Flavor Nausea  And Vomiting   N/V   Shellfish Allergy Swelling      Medication List        Accurate as of 10/23/17 12:56 PM. Always use your most recent med list.          ALPRAZolam 0.5 MG tablet Commonly known as:  XANAX Take 1 tablet (0.5 mg total) by mouth daily as needed. for anxiety   B-D 3CC LUER-LOK SYR 25GX1/2" 25G X 1-1/2" 3 ML Misc Generic drug:  SYRINGE-NEEDLE (DISP) 3 ML USE TO INJECT INTO MUSCLE FOR 1 DOSE   cyanocobalamin 1000 MCG/ML injection Commonly known as:  (VITAMIN B-12) INJ 1 ML IM ONCE FOR 1 DOSE.   diazepam 10 MG tablet Commonly known as:  VALIUM INSERT 1 TABLET VAGINALLY QHS PRN   EPINEPHrine 0.3 mg/0.3 mL Soaj injection Commonly known as:  EPI-PEN INJECT INTRAMUSCULARLY AS DIRECTED   EPINEPHrine 0.3 mg/0.3 mL Soaj injection Commonly known as:  AUVI-Q Use as directed for severe allergic reactions   ergocalciferol 50000 units capsule Commonly known as:  VITAMIN D2 Take 1 capsule (50,000 Units total) by mouth once a week.   FIBER PO Take by mouth.   gabapentin 300 MG capsule Commonly known as:  NEURONTIN TAKE 1 CAPSULE(300 MG) BY MOUTH AT BEDTIME   linaclotide 145 MCG Caps capsule Commonly known as:   LINZESS Take 1 capsule (145 mcg total) by mouth daily before breakfast.   NON FORMULARY Take 1 capsule by mouth 3 (three) times daily. Herbal supplements for gastritis   omeprazole 40 MG capsule Commonly known as:  PRILOSEC Take 1 capsule (40 mg total) by mouth daily.   PROBIOTIC DAILY PO Take by mouth.   ranitidine 150 MG capsule Commonly known as:  ZANTAC Take 1 capsule (150 mg total) by mouth 2 (two) times daily.       Birth History: non-contributory.   Developmental History: non-contributory.   Past Surgical History: Past Surgical History:  Procedure Laterality Date  . ABDOMINAL HYSTERECTOMY    . HEMORRHOID SURGERY    . TUBAL LIGATION    . TUMOR REMOVAL     Left thigh      Family History: Family History  Problem Relation Age of Onset  . Diabetes Mother   . Liver cancer Mother        renal cell   . Heart disease Father        first MI in 73s  . Cervical cancer Sister   . Allergic rhinitis Sister   . Asthma Sister   . Heart disease Brother        first MI in 26s  . Alcohol abuse Sister   . Fibromyalgia Sister   . Pancreatic cancer Sister 81  . Arthritis Sister        osteoarthritis  . Emphysema Sister   . Irritable bowel syndrome Sister   . Emphysema Sister   . Leukemia Unknown        grandson  . Urticaria Daughter   . Allergic rhinitis Daughter   . Stomach cancer Neg Hx   . Colon cancer Neg Hx   . Angioedema Neg Hx   . Eczema Neg Hx   . Immunodeficiency Neg Hx      Social History: Jamie [name] lives at home by herself. She lives in a house that is 65 years old. There is wood flooring throughout. She has gas heating and electric heating with central cooling. There is a dog outside of the home. There are no dust mite  coverings on the bedding. There is no tobacco exposure now, but she did smoke in the distant past (around 30 years ago). She is a retired Forensic psychologist and quit in 2011. She was married until around five years ago.      Review of  Systems: a 14-point review of systems is pertinent for what is mentioned in HPI.  Otherwise, all other systems were negative. Constitutional: negative other than that listed in the HPI Eyes: negative other than that listed in the HPI Ears, nose, mouth, throat, and face: negative other than that listed in the HPI Respiratory: negative other than that listed in the HPI Cardiovascular: negative other than that listed in the HPI Gastrointestinal: negative other than that listed in the HPI Genitourinary: negative other than that listed in the HPI Integument: negative other than that listed in the HPI Hematologic: negative other than that listed in the HPI Musculoskeletal: negative other than that listed in the HPI Neurological: negative other than that listed in the HPI Allergy/Immunologic: negative other than that listed in the HPI    Objective:   Blood pressure 122/72, pulse 81, temperature 98.1 F (36.7 C), temperature source Oral, resp. rate 16, height 5\' 4"  (1.626 m), weight 150 lb 12.7 oz (68.4 kg), SpO2 98 %. Body mass index is 25.88 kg/m.   Physical Exam:  General: Alert, interactive, in no acute distress. Talkative and pleasant.  Eyes: No conjunctival injection bilaterally, no discharge on the right, no discharge on the left and no Horner-Trantas dots present. PERRL bilaterally. EOMI without pain. No photophobia.  Ears: Right TM pearly gray with normal light reflex, Left TM pearly gray with normal light reflex, Right TM intact without perforation and Left TM intact without perforation.  Nose/Throat: External nose within normal limits and septum midline. Turbinates edematous without discharge. Posterior oropharynx mildly erythematous without cobblestoning in the posterior oropharynx. Tonsils 2+ without exudates.  Tongue without thrush. Neck: Supple without thyromegaly. Trachea midline. Adenopathy: no enlarged lymph nodes appreciated in the anterior cervical, occipital, axillary,  epitrochlear, inguinal, or popliteal regions. Lungs: Clear to auscultation without wheezing, rhonchi or rales. No increased work of breathing. CV: Normal S1/S2. No murmurs. Capillary refill <2 seconds.  Abdomen: Nondistended, nontender. No guarding or rebound tenderness. Bowel sounds present in all fields and hypoactive  Skin: Warm and dry, without lesions or rashes. Extremities:  No clubbing, cyanosis or edema. Neuro:   Grossly intact. No focal deficits appreciated. Responsive to questions.  Diagnostic studies:   Spirometry: results normal (FEV1: 2.24/91%, FVC: 2.30/72%, FEV1/FVC: 97%).    Spirometry consistent with normal pattern.  Allergy Studies: none     Salvatore Marvel, MD [facility]

## 2017-10-28 ENCOUNTER — Encounter: Payer: Medicaid Other | Admitting: Physical Therapy

## 2017-10-28 ENCOUNTER — Encounter: Payer: Self-pay | Admitting: Allergy & Immunology

## 2017-10-30 LAB — STREP PNEUMONIAE 23 SEROTYPES IGG
PNEUMO AB TYPE 20: 2.2 ug/mL (ref >1.3)
PNEUMO AB TYPE 26 (6B): 0.4 ug/mL — AB (ref >1.3)
PNEUMO AB TYPE 34 (10A): 2.4 ug/mL (ref >1.3)
PNEUMO AB TYPE 57 (19A): 3.3 ug/mL (ref >1.3)
PNEUMO AB TYPE 5: 28.9 ug/mL (ref >1.3)
PNEUMO AB TYPE 8: 3.9 ug/mL (ref >1.3)
Pneumo Ab Type 1*: 2.4 ug/mL (ref >1.3)
Pneumo Ab Type 12 (12F)*: <0.1 ug/mL — ABNORMAL LOW (ref >1.3)
Pneumo Ab Type 14*: 2 ug/mL (ref >1.3)
Pneumo Ab Type 17 (17F)*: 0.5 ug/mL — ABNORMAL LOW (ref >1.3)
Pneumo Ab Type 19 (19F)*: 1.6 ug/mL (ref >1.3)
Pneumo Ab Type 2*: <0.1 ug/mL — ABNORMAL LOW (ref >1.3)
Pneumo Ab Type 22 (22F)*: 1.6 ug/mL (ref >1.3)
Pneumo Ab Type 23 (23F)*: 4.3 ug/mL (ref >1.3)
Pneumo Ab Type 3*: 0.2 ug/mL — ABNORMAL LOW (ref >1.3)
Pneumo Ab Type 43 (11A)*: <0.1 ug/mL — ABNORMAL LOW (ref >1.3)
Pneumo Ab Type 51 (7F)*: 1.4 ug/mL (ref >1.3)
Pneumo Ab Type 54 (15B)*: 2.1 ug/mL (ref >1.3)
Pneumo Ab Type 56 (18C)*: 8.9 ug/mL (ref >1.3)
Pneumo Ab Type 68 (9V)*: <0.1 ug/mL — ABNORMAL LOW (ref >1.3)
Pneumo Ab Type 70 (33F)*: 1.9 ug/mL (ref >1.3)
Pneumo Ab Type 9 (9N)*: 0.3 ug/mL — ABNORMAL LOW (ref >1.3)

## 2017-10-30 LAB — IGG, IGA, IGM
IGG (IMMUNOGLOBIN G), SERUM: 1011 mg/dL (ref 700–1600)
IgA/Immunoglobulin A, Serum: 149 mg/dL (ref 87–352)
IgM (Immunoglobulin M), Srm: 164 mg/dL (ref 26–217)

## 2017-10-30 LAB — IGE+ALLERGENS ZONE 2(30)
Alternaria Alternata IgE: <0.1 kU/L
Cedar, Mountain IgE: <0.1 kU/L
D Pteronyssinus IgE: <0.1 kU/L
IgE (Immunoglobulin E), Serum: 232 IU/mL (ref 6–495)
Mugwort IgE Qn: <0.1 kU/L
Nettle IgE: <0.1 kU/L
Oak, White IgE: 0.32 kU/L — AB
Penicillium Chrysogen IgE: <0.1 kU/L
Plantain, English IgE: <0.1 kU/L
Ragweed, Short IgE: <0.1 kU/L
Stemphylium Herbarum IgE: <0.1 kU/L

## 2017-10-30 LAB — DIPHTHERIA / TETANUS ANTIBODY PANEL: Tetanus Ab, IgG: 4.83 IU/mL (ref <0.10)

## 2017-10-30 LAB — ALLERGEN PROFILE, FOOD-FISH
Allergen Salmon IgE: <0.1 kU/L
Allergen Trout IgE: <0.1 kU/L
Codfish IgE: <0.1 kU/L
Tuna: <0.1 kU/L

## 2017-10-30 LAB — ALLERGEN PROFILE, SHELLFISH

## 2017-10-30 LAB — COMPLEMENT, TOTAL: Compl, Total (CH50): >60 U/mL (ref >41)

## 2017-11-04 ENCOUNTER — Encounter: Payer: Self-pay | Admitting: Physical Therapy

## 2017-11-04 ENCOUNTER — Ambulatory Visit: Payer: Medicaid Other | Admitting: Physical Therapy

## 2017-11-04 DIAGNOSIS — M6281 Muscle weakness (generalized): Secondary | ICD-10-CM

## 2017-11-04 DIAGNOSIS — M62838 Other muscle spasm: Secondary | ICD-10-CM

## 2017-11-04 DIAGNOSIS — R278 Other lack of coordination: Secondary | ICD-10-CM

## 2017-11-04 NOTE — Patient Instructions (Addendum)
   The goal of your sitting position is to bring your body weight slightly foward so that your legs support your body, not your low back.  There are a few positioning tips that allow this to occur:  1 - Raise your seat height so that your hips are higher than your knees.  This allows your pelvis to roll slightly forward to prevent sitting on your sacrum/tailbone, and to allow your body weight to be supported by your pelvic floor/groin area.  2 - Position your feet so that one leg is bent more than 90 and one is about 90 which will help keep your weight forward slightly.  This also allows for a dynamic sitting position.  3 - Slightly lean your trunk a bit forward as seen in the picture; although it will feel very forward, notice that your body weight is now translated into your legs, not your low back.  Now your low back is relieved of any tension.  4 - Lastly, allow your breastbone/chest to slightly tilt downard a bit to relax your shoulders and upper back instead of sitting up very erect and tall.  You may also notice with this last step that your abdominals naturally turn on a bit which also helps your back in this position (note:  dont think of purposefully contracting your abdominals, it should happen natrually and is subtle).   Movements:  if you need to reach for items around your desk, hinge at your hips instead of rounding your back forward.  [facility] [address] [place], [place] [address] Phone # [phone] Fax [phone]

## 2017-11-04 NOTE — Therapy (Signed)
[facility] [address] [place], [place], [address] Phone: [phone]   Fax:  [phone]  Physical Therapy Treatment  Patient Details  Name: FELCIA HUEBERT MRN: [id number] Date of Birth: [birth year] Referring Provider: Dr. [name]   Encounter Date: 11/04/2017  PT End of Session - 11/04/17 1103    Visit Number  5    Date for PT Re-Evaluation  12/13/17    Authorization Type  Medicaid    Authorization Time Period  3/4-5/5    Authorization - Visit Number  2    Authorization - Number of Visits  9    PT Start Time  1020 came late    PT Stop Time  1100    PT Time Calculation (min)  40 min    Activity Tolerance  Patient tolerated treatment well    Behavior During Therapy  [facility] for tasks assessed/performed       Past Medical History:  Diagnosis Date  . Anxiety   . Depression   . Diverticulitis   . Diverticulosis   . Headache   . Hyperlipidemia   . IBS (irritable bowel syndrome)   . Lung mass   . Syncope 03/08/2015    Past Surgical History:  Procedure Laterality Date  . ABDOMINAL HYSTERECTOMY    . HEMORRHOID SURGERY    . TUBAL LIGATION    . TUMOR REMOVAL     Left thigh     There were no vitals filed for this visit.  Subjective Assessment - 11/04/17 1022    Subjective  I have been very sick.  I am leaking urine. I have noticed some leakage.     How long can you sit comfortably?  pain level is 5-7/10 vaginal    How long can you stand comfortably?  pain level 4/10 lower vaginal    How long can you walk comfortably?  no pain    Patient Stated Goals  strengthen legs and pelvic floor to reduce urinary leakage    Currently in Pain?  Yes    Pain Score  4  high is 10/10    Pain Location  Abdomen    Pain Orientation  Lower    Pain Descriptors / Indicators  Cramping    Pain Type  Chronic pain    Pain Frequency  Intermittent    Aggravating Factors   when bladder is full    Pain Relieving Factors  release  the urine    Multiple Pain Sites  No                No data recorded       [facility] Adult PT Treatment/Exercise - 11/04/17 0001      Lumbar Exercises: Supine   Ab Set  10 reps;5 seconds    Clam  20 reps;1 second work on pelvic floor contraction and control      Manual Therapy   Manual Therapy  Soft tissue mobilization;Myofascial release    Soft tissue mobilization  above the pubic bone to reduce the muscle tension and pain    Myofascial Release  release through the fascia above the pubic bone with one hand on sacrum and lower abdomen             PT Education - 11/04/17 1103    Education provided  Yes    Education Details  pelvic floor exercies in sitting    Person(s) Educated  Patient    Methods  Explanation;Demonstration;Verbal cues;Handout  Comprehension  Returned demonstration;Verbalized understanding       PT Short Term Goals - 09/25/17 1410      PT SHORT TERM GOAL #1   Title  independent with initial HEP    Time  4    Period  Weeks    Status  Achieved      PT SHORT TERM GOAL #2   Title  ability to perform abdominal massage to improve mobility of intestines and reduction in pain    Time  4    Period  Weeks    Status  Achieved      PT SHORT TERM GOAL #3   Title  understand how to toilet correctly to reduce strain on the pelvic floor     Time  4    Period  Weeks    Status  Achieved        PT Long Term Goals - 10/21/17 1442      PT LONG TERM GOAL #1   Title  independent with HEP and how to progress herself    Baseline  still learning    Time  9    Period  Weeks    Status  On-going      PT LONG TERM GOAL #2   Title  understand ways to manage pain and relax her muscles to improve function    Baseline  Not educated yet    Time  9    Period  Weeks    Status  On-going      PT LONG TERM GOAL #3   Title  when holding her urine pain in vagina decreases >/= 2/10 due to improved relaxation of the tissue    Time  9    Period  Weeks     Status  On-going      PT LONG TERM GOAL #4   Title  lower abdominal pain </= 3-4/10 due to ability to relax the pelvic floor muscles with sitting and standing    Baseline  pain level 5/10; left pelvic floor muscles are hypertonic    Time  4    Period  Weeks    Status  On-going      PT LONG TERM GOAL #5   Title  urinary leakage with coughing and sneezing decreased to </= 1 time per month    Baseline  No urinary leakage    Time  4    Period  Weeks    Status  Achieved            Plan - 11/04/17 1027    Clinical Impression Statement  Patient has been sick with a bad cough so she has had increased urinary leakage.  Patient had tightness located in the suprapubic area and after manual skills the area was softer.  Patient pain decreased to 3/10 after therapy.  Patient will benefit from skilled therapy to improve mobility of tissue, improve strength and reduce pelvic floor pain.     Rehab Potential  Excellent    Clinical Impairments Affecting Rehab Potential  Abdominal hysterectomy; Anxiety, Diverticulitis, IBS, and Hemorroic surgery    PT Frequency  1x / week    PT Duration  Other (comment) 9 weeks    PT Treatment/Interventions  Cryotherapy;Electrical Stimulation;Moist Heat;Ultrasound;Therapeutic exercise;Therapeutic activities;Neuromuscular re-education;Patient/family education;Passive range of motion;Manual techniques;Dry needling;Energy conservation;Biofeedback    PT Next Visit Plan  soft tissue work to left levator ani internally, abdominal strength with pelvic floor contraction; nustep.  gym exercises    PT Home  Exercise Plan  progress as needed    Consulted and Agree with Plan of Care  Patient       Patient will benefit from skilled therapeutic intervention in order to improve the following deficits and impairments:  Increased fascial restricitons, Pain, Decreased coordination, Decreased scar mobility, Decreased activity tolerance, Decreased endurance, Decreased strength  Visit  Diagnosis: Other muscle spasm  Muscle weakness (generalized)  Other lack of coordination     Problem List Patient Active Problem List   Diagnosis Date Noted  . Acute bilateral low back pain with bilateral sciatica 10/07/2017  . Pelvic floor weakness 09/16/2017  . Bilateral headaches 09/16/2017  . Milk allergy 09/02/2017  . Vitamin D deficiency 08/23/2017  . Iron deficiency anemia secondary to inadequate dietary iron intake 08/23/2017  . Abnormal urine odor 08/23/2017  . Atypical chest pain 08/23/2017  . Bloating 08/23/2017  . Anxiety 08/23/2017  . Carotid stenosis, asymptomatic, bilateral 05/08/2017  . History of excessive cerumen 05/08/2017  . Cardiovascular risk factor 05/08/2017  . Gastritis and duodenitis 04/15/2017  . 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 Splenic artery aneurysm ([place]) 03/22/2017  . Vision changes 01/25/2017  . Hematuria 01/25/2017  . History of diverticulitis 01/25/2017  . Acute left lower quadrant pain 01/25/2017  . Exposure to chemical inhalation 11/06/2016  . Right knee pain 11/05/2016  . Former smoker 05/14/2016  . Hypopigmentation 01/11/2016  . Anaphylactic reaction due to food 01/11/2016  . Vaginal dryness 01/11/2016  . Multiple food allergies 01/11/2016  . 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 Carpal tunnel syndrome 10/17/2015  . Left arm numbness 10/13/2015  . Neck pain 10/13/2015  . Pernicious anemia 10/13/2015  . Chronically dry eyes 10/13/2015  . DDD (degenerative disc disease), cervical 08/31/2015  . Mid back pain on left side 07/20/2015  . Hair loss 07/20/2015  . Family history of renal cancer 07/20/2015  . History of shingles 07/19/2015  . Low iron stores 07/19/2015  . Thyroid activity decreased 07/19/2015  . Constipation 07/19/2015  . Hyperpigmentation of skin 07/19/2015  . Depression with anxiety 06/18/2015  . Normocytic anemia 06/18/2015  . Diverticulitis of large intestine with abscess without bleeding   . B12  deficiency 06/13/2015  . Migraine with aura and with status migrainosus, not intractable 04/20/2015  . Lung mass 04/20/2015  . Generalized abdominal pain 01/24/2014  . Osteopenia 01/20/2014  . Seasonal and perennial allergic rhinitis 11/06/2011  . Diverticulosis of large intestine 07/03/2011  . Hyperlipidemia 12/13/2010  . ONYCHOMYCOSIS, TOENAILS 06/09/2010  . ANXIETY DEPRESSION 06/09/2010    Earlie Counts, PT 11/04/17 11:07 AM   [facility] [address] [place], [place], [address] Phone: [phone]   Fax:  [phone]  Name: KACEY DYSERT MRN: [id number] Date of Birth: November 29, 1950

## 2017-11-07 ENCOUNTER — Other Ambulatory Visit: Payer: Self-pay | Admitting: Physician Assistant

## 2017-11-07 DIAGNOSIS — F419 Anxiety disorder, unspecified: Secondary | ICD-10-CM

## 2017-11-07 NOTE — Telephone Encounter (Signed)
[facility] requesting RF on Xanax. Last RF appears to have been sent for #90 with 1 RF in January 2019. Please advise if RF appropriate.

## 2017-11-11 ENCOUNTER — Encounter: Payer: Self-pay | Admitting: Physical Therapy

## 2017-11-11 ENCOUNTER — Ambulatory Visit: Payer: Medicaid Other | Attending: Physician Assistant | Admitting: Physical Therapy

## 2017-11-11 DIAGNOSIS — M6281 Muscle weakness (generalized): Secondary | ICD-10-CM | POA: Insufficient documentation

## 2017-11-11 DIAGNOSIS — R278 Other lack of coordination: Secondary | ICD-10-CM

## 2017-11-11 DIAGNOSIS — M62838 Other muscle spasm: Secondary | ICD-10-CM | POA: Diagnosis present

## 2017-11-11 NOTE — Patient Instructions (Addendum)
   While lying on your back, place an elastic band around your knees and pull your knees apart. Hold this and then tighten your lower abdominals, squeeze your buttocks and raise your buttocks off the floor/bed as creating a "Bridge" with your body. 15 times.    Slow Contraction: Gravity Resisted (Sitting)    Sitting, slowly squeeze pelvic floor for __5_ seconds. Rest for _5__ seconds. Repeat _10__ times. Do __3_ times a day.  Copyright  VHI. All rights reserved.  [facility] [address], [place], [place] [address] Phone # [phone] Fax [phone]

## 2017-11-11 NOTE — Therapy (Signed)
[facility] [address], [place], [place], [address] Phone: [phone]   Fax:  [phone]  Physical Therapy Treatment  Patient Details  Name: Jamie [name] MRN: [id number] Date of Birth: 1951/03/25 Referring Provider: Dr. [name]   Encounter Date: 11/11/2017  PT End of Session - 11/11/17 1057    Visit Number  6    Date for PT Re-Evaluation  12/13/17    Authorization Type  Medicaid    Authorization - Visit Number  3    Authorization - Number of Visits  9    PT Start Time  2440    PT Stop Time  1058    PT Time Calculation (min)  43 min    Activity Tolerance  Patient tolerated treatment well    Behavior During Therapy  [facility] for tasks assessed/performed       Past Medical History:  Diagnosis Date  . Anxiety   . Depression   . Diverticulitis   . Diverticulosis   . Headache   . Hyperlipidemia   . IBS (irritable bowel syndrome)   . Lung mass   . Syncope 03/08/2015    Past Surgical History:  Procedure Laterality Date  . ABDOMINAL HYSTERECTOMY    . HEMORRHOID SURGERY    . TUBAL LIGATION    . TUMOR REMOVAL     Left thigh     There were no vitals filed for this visit.  Subjective Assessment - 11/11/17 1017    Subjective  Urinary leakage is 50% better.  If I hold my urine I will have pain in the bladder area. I am walking at the gym.  I am doing the hip machine and leg press.     How long can you sit comfortably?  pain level is 5-7/10 vaginal    How long can you stand comfortably?  pain level 4/10 lower vaginal    How long can you walk comfortably?  no pain    Patient Stated Goals  strengthen legs and pelvic floor to reduce urinary leakage    Currently in Pain?  Yes    Pain Score  4     Pain Location  Abdomen    Pain Orientation  Lower    Pain Type  Chronic pain    Pain Onset  More than a month ago    Pain Frequency  Intermittent    Aggravating Factors   when bladder is full    Pain Relieving  Factors  release the uring    Multiple Pain Sites  No                    Pelvic Floor Special Questions - 11/11/17 0001    Pelvic Floor Internal Exam  Patient comfirms identification and approves PT to asses pelvic floor    Exam Type  Vaginal    Strength  good squeeze, good lift, able to hold agaisnt strong resistance        [facility] Adult PT Treatment/Exercise - 11/11/17 0001      Therapeutic Activites    ADL's  sit to stand with upright posture      Lumbar Exercises: Supine   Clam  20 reps;1 second work on pelvic floor contraction and control      Manual Therapy   Manual Therapy  Internal Pelvic Floor    Internal Pelvic Floor  bulbocavernosus, obturator internist, levator ani             PT  Education - 11/11/17 1056    Education provided  Yes    Education Details  pelvic floor contraction in sitting, bridge with band, sitting to standing posture    Person(s) Educated  Patient    Methods  Explanation;Demonstration;Verbal cues;Handout    Comprehension  Returned demonstration;Verbalized understanding       PT Short Term Goals - 09/25/17 1410      PT SHORT TERM GOAL #1   Title  independent with initial HEP    Time  4    Period  Weeks    Status  Achieved      PT SHORT TERM GOAL #2   Title  ability to perform abdominal massage to improve mobility of intestines and reduction in pain    Time  4    Period  Weeks    Status  Achieved      PT SHORT TERM GOAL #3   Title  understand how to toilet correctly to reduce strain on the pelvic floor     Time  4    Period  Weeks    Status  Achieved        PT Long Term Goals - 11/11/17 1100      PT LONG TERM GOAL #1   Title  independent with HEP and how to progress herself    Baseline  still learning    Time  9    Period  Weeks    Status  On-going      PT LONG TERM GOAL #2   Title  understand ways to manage pain and relax her muscles to improve function    Time  9    Period  Weeks    Status  On-going       PT LONG TERM GOAL #3   Title  when holding her urine pain in vagina decreases >/= 2/10 due to improved relaxation of the tissue    Baseline  pain is 6/10    Time  9    Period  Weeks    Status  On-going      PT LONG TERM GOAL #4   Title  lower abdominal pain </= 3-4/10 due to ability to relax the pelvic floor muscles with sitting and standing    Baseline  pain level 5/10; left pelvic floor muscles are hypertonic    Time  4    Period  Weeks    Status  On-going      PT LONG TERM GOAL #5   Title  urinary leakage with coughing and sneezing decreased to </= 1 time per month    Baseline  50% better    Time  4    Period  Weeks    Status  Achieved            Plan - 11/11/17 1019    Clinical Impression Statement  Patient reports her urinary leakage is 50% better.  Patient is now working out in the gym.  Patient pelvic floor strength has increased to 4/5 and is circular.  Patient has some trigger points in the pelvic floor muscles. Patient will benefit from skilled therapy to improve mobility of tissue, improve strength and reduce pelvic floor pain.     Rehab Potential  Excellent    Clinical Impairments Affecting Rehab Potential  Abdominal hysterectomy; Anxiety, Diverticulitis, IBS, and Hemorroic surgery    PT Frequency  1x / week    PT Duration  Other (comment) 9 weeks    PT Treatment/Interventions  Cryotherapy;Electrical Stimulation;Moist Heat;Ultrasound;Therapeutic exercise;Therapeutic activities;Neuromuscular re-education;Patient/family education;Passive range of motion;Manual techniques;Dry needling;Energy conservation;Biofeedback    PT Next Visit Plan  soft tissue work to left levator ani internally, abdominal strength with pelvic floor contraction gym exercises    PT Home Exercise Plan  progress as needed    Consulted and Agree with Plan of Care  Patient       Patient will benefit from skilled therapeutic intervention in order to improve the following deficits and  impairments:  Increased fascial restricitons, Pain, Decreased coordination, Decreased scar mobility, Decreased activity tolerance, Decreased endurance, Decreased strength  Visit Diagnosis: Other muscle spasm  Muscle weakness (generalized)  Other lack of coordination     Problem List Patient Active Problem List   Diagnosis Date Noted  . Acute bilateral low back pain with bilateral sciatica 10/07/2017  . Pelvic floor weakness 09/16/2017  . Bilateral headaches 09/16/2017  . Milk allergy 09/02/2017  . Vitamin D deficiency 08/23/2017  . Iron deficiency anemia secondary to inadequate dietary iron intake 08/23/2017  . Abnormal urine odor 08/23/2017  . Atypical chest pain 08/23/2017  . Bloating 08/23/2017  . Anxiety 08/23/2017  . Carotid stenosis, asymptomatic, bilateral 05/08/2017  . History of excessive cerumen 05/08/2017  . Cardiovascular risk factor 05/08/2017  . Gastritis and duodenitis 04/15/2017  . 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 Splenic artery aneurysm ([place]) 03/22/2017  . Vision changes 01/25/2017  . Hematuria 01/25/2017  . History of diverticulitis 01/25/2017  . Acute left lower quadrant pain 01/25/2017  . Exposure to chemical inhalation 11/06/2016  . Right knee pain 11/05/2016  . Former smoker 05/14/2016  . Hypopigmentation 01/11/2016  . Anaphylactic reaction due to food 01/11/2016  . Vaginal dryness 01/11/2016  . Multiple food allergies 01/11/2016  . 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 Carpal tunnel syndrome 10/17/2015  . Left arm numbness 10/13/2015  . Neck pain 10/13/2015  . Pernicious anemia 10/13/2015  . Chronically dry eyes 10/13/2015  . DDD (degenerative disc disease), cervical 08/31/2015  . Mid back pain on left side 07/20/2015  . Hair loss 07/20/2015  . Family history of renal cancer 07/20/2015  . History of shingles 07/19/2015  . Low iron stores 07/19/2015  . Thyroid activity decreased 07/19/2015  . Constipation 07/19/2015  .  Hyperpigmentation of skin 07/19/2015  . Depression with anxiety 06/18/2015  . Normocytic anemia 06/18/2015  . Diverticulitis of large intestine with abscess without bleeding   . B12 deficiency 06/13/2015  . Migraine with aura and with status migrainosus, not intractable 04/20/2015  . Lung mass 04/20/2015  . Generalized abdominal pain 01/24/2014  . Osteopenia 01/20/2014  . Seasonal and perennial allergic rhinitis 11/06/2011  . Diverticulosis of large intestine 07/03/2011  . Hyperlipidemia 12/13/2010  . ONYCHOMYCOSIS, TOENAILS 06/09/2010  . ANXIETY DEPRESSION 06/09/2010    Earlie Counts, PT 11/11/17 11:01 AM   [facility] [address], [place], [place], [address] Phone: [phone]   Fax:  [phone]  Name: Jamie [name] MRN: [id number] Date of Birth: [birth year]

## 2017-11-18 ENCOUNTER — Encounter: Payer: Self-pay | Admitting: Physical Therapy

## 2017-11-18 ENCOUNTER — Ambulatory Visit: Payer: Medicaid Other | Admitting: Physical Therapy

## 2017-11-18 DIAGNOSIS — R278 Other lack of coordination: Secondary | ICD-10-CM

## 2017-11-18 DIAGNOSIS — M62838 Other muscle spasm: Secondary | ICD-10-CM

## 2017-11-18 DIAGNOSIS — M6281 Muscle weakness (generalized): Secondary | ICD-10-CM

## 2017-11-18 NOTE — Therapy (Signed)
[facility] [address], [place], [place], [address] Phone: [phone]   Fax:  [phone]  Physical Therapy Treatment  Patient Details  Name: Jamie [name] MRN: [id number] Date of Birth: [birth year] Referring Provider: Dr. [name]   Encounter Date: 11/18/2017  PT End of Session - 11/18/17 1017    Visit Number  7    Date for PT Re-Evaluation  12/13/17    Authorization Type  Medicaid    Authorization Time Period  3/4-5/5    Authorization - Visit Number  4    Authorization - Number of Visits  9    PT Start Time  6269    PT Stop Time  1055    PT Time Calculation (min)  40 min    Activity Tolerance  Patient tolerated treatment well    Behavior During Therapy  [facility] for tasks assessed/performed       Past Medical History:  Diagnosis Date  . Anxiety   . Depression   . Diverticulitis   . Diverticulosis   . Headache   . Hyperlipidemia   . IBS (irritable bowel syndrome)   . Lung mass   . Syncope 03/08/2015    Past Surgical History:  Procedure Laterality Date  . ABDOMINAL HYSTERECTOMY    . HEMORRHOID SURGERY    . TUBAL LIGATION    . TUMOR REMOVAL     Left thigh     There were no vitals filed for this visit.  Subjective Assessment - 11/18/17 1021    Subjective  I am a member of the gym.  I have mini trampoline. Gets up 2 times in the night.  Pain is second time to urinate at night.     How long can you sit comfortably?  pain level is 5-7/10 vaginal    How long can you stand comfortably?  pain level 4/10 lower vaginal    How long can you walk comfortably?  no pain    Patient Stated Goals  strengthen legs and pelvic floor to reduce urinary leakage    Currently in Pain?  Yes    Pain Score  4     Pain Location  Abdomen    Pain Orientation  Lower    Pain Descriptors / Indicators  Cramping    Pain Type  Chronic pain    Pain Onset  More than a month ago    Pain Frequency  Intermittent    Aggravating  Factors   Started to eat bran and changed bowels, when bladder is full    Pain Relieving Factors  release the urine    Multiple Pain Sites  No                       [facility] Adult PT Treatment/Exercise - 11/18/17 0001      Self-Care   Self-Care  Other Self-Care Comments    Other Self-Care Comments   drinking no 16 ounces prior to bed and just drink 4 ounces to reduce the pain. urge to urinate      Lumbar Exercises: Aerobic   Stationary Bike  level 3 x 8 min      Lumbar Exercises: Standing   Other Standing Lumbar Exercises  move up and down with breath to incorporate pelvic floor             PT Education - 11/18/17 1054    Education provided  Yes    Education Details  progressed  pelvic floor exericse; urge to void, not drinking 16 oz of water prior to bed    Person(s) Educated  Patient    Methods  Explanation;Demonstration;Verbal cues;Handout    Comprehension  Returned demonstration;Verbalized understanding       PT Short Term Goals - 09/25/17 1410      PT SHORT TERM GOAL #1   Title  independent with initial HEP    Time  4    Period  Weeks    Status  Achieved      PT SHORT TERM GOAL #2   Title  ability to perform abdominal massage to improve mobility of intestines and reduction in pain    Time  4    Period  Weeks    Status  Achieved      PT SHORT TERM GOAL #3   Title  understand how to toilet correctly to reduce strain on the pelvic floor     Time  4    Period  Weeks    Status  Achieved        PT Long Term Goals - 11/18/17 1058      PT LONG TERM GOAL #2   Title  understand ways to manage pain and relax her muscles to improve function    Baseline  just educated    Time  9    Period  Weeks    Status  On-going      PT LONG TERM GOAL #3   Title  when holding her urine pain in vagina decreases >/= 2/10 due to improved relaxation of the tissue    Baseline  pain is 6/10    Time  9    Period  Weeks    Status  On-going      PT LONG TERM GOAL  #5   Title  urinary leakage with coughing and sneezing decreased to </= 1 time per month    Baseline  50% better    Time  4    Period  Weeks    Status  Achieved            Plan - 11/18/17 1056    Clinical Impression Statement  Patient need verbal cues to breath correctly with exercise instead of holding breath.  Patient needs tactile cues to contract the lower abdominal with core strength.  Patient get the strong urge to urinate but has to work on decreasing the urge and using the correct techniques.  Patient will benefit from skilled therapy to improve mobility of tissue, improve strength and and reduce pelvic pain.  Patient only gets bladder pain when she has to urinate the second time at night.     Rehab Potential  Excellent    Clinical Impairments Affecting Rehab Potential  Abdominal hysterectomy; Anxiety, Diverticulitis, IBS, and Hemorroic surgery    PT Frequency  1x / week    PT Duration  Other (comment) 9 weeks    PT Next Visit Plan  soft tissue work to left levator ani internally, abdominal strength with pelvic floor contraction gym exercises    PT Home Exercise Plan  Access Code: [email or web address]    Consulted and Agree with Plan of Care  Patient       Patient will benefit from skilled therapeutic intervention in order to improve the following deficits and impairments:  Increased fascial restricitons, Pain, Decreased coordination, Decreased scar mobility, Decreased activity tolerance, Decreased endurance, Decreased strength  Visit Diagnosis: Other muscle spasm  Muscle weakness (generalized)  Other lack of  coordination     Problem List Patient Active Problem List   Diagnosis Date Noted  . Acute bilateral low back pain with bilateral sciatica 10/07/2017  . Pelvic floor weakness 09/16/2017  . Bilateral headaches 09/16/2017  . Milk allergy 09/02/2017  . Vitamin D deficiency 08/23/2017  . Iron deficiency anemia secondary to inadequate dietary iron intake 08/23/2017  .  Abnormal urine odor 08/23/2017  . Atypical chest pain 08/23/2017  . Bloating 08/23/2017  . Anxiety 08/23/2017  . Carotid stenosis, asymptomatic, bilateral 05/08/2017  . History of excessive cerumen 05/08/2017  . Cardiovascular risk factor 05/08/2017  . Gastritis and duodenitis 04/15/2017  . 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 Splenic artery aneurysm ([place]) 03/22/2017  . Vision changes 01/25/2017  . Hematuria 01/25/2017  . History of diverticulitis 01/25/2017  . Acute left lower quadrant pain 01/25/2017  . Exposure to chemical inhalation 11/06/2016  . Right knee pain 11/05/2016  . Former smoker 05/14/2016  . Hypopigmentation 01/11/2016  . Anaphylactic reaction due to food 01/11/2016  . Vaginal dryness 01/11/2016  . Multiple food allergies 01/11/2016  . 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 Carpal tunnel syndrome 10/17/2015  . Left arm numbness 10/13/2015  . Neck pain 10/13/2015  . Pernicious anemia 10/13/2015  . Chronically dry eyes 10/13/2015  . DDD (degenerative disc disease), cervical 08/31/2015  . Mid back pain on left side 07/20/2015  . Hair loss 07/20/2015  . Family history of renal cancer 07/20/2015  . History of shingles 07/19/2015  . Low iron stores 07/19/2015  . Thyroid activity decreased 07/19/2015  . Constipation 07/19/2015  . Hyperpigmentation of skin 07/19/2015  . Depression with anxiety 06/18/2015  . Normocytic anemia 06/18/2015  . Diverticulitis of large intestine with abscess without bleeding   . B12 deficiency 06/13/2015  . Migraine with aura and with status migrainosus, not intractable 04/20/2015  . Lung mass 04/20/2015  . Generalized abdominal pain 01/24/2014  . Osteopenia 01/20/2014  . Seasonal and perennial allergic rhinitis 11/06/2011  . Diverticulosis of large intestine 07/03/2011  . Hyperlipidemia 12/13/2010  . ONYCHOMYCOSIS, TOENAILS 06/09/2010  . ANXIETY DEPRESSION 06/09/2010    Earlie Counts, PT 11/18/17 10:59 AM   [facility] [address] [place], [place], [address] Phone: [phone]   Fax:  [phone]  Name: Jamie [name] MRN: [id number] Date of Birth: Sep 19, 1950

## 2017-11-18 NOTE — Patient Instructions (Addendum)
Certain foods and liquids will decrease the pH making the urine more acidic.  Urinary urgency increases when the urine has a low pH.  Most common irritants: alcohol, carbonated beverages and caffinated beverages.  Foods to avoid: apple juice, apples, ascorbic acid, canteloupes, chili, citrus fruits, coffee, cranberries, grapes, guava, peaches, pepper, pineapple, plums, strawberries, tea, tomatoes, and vinegar.  Drinking plenty of water may help to increase the pH and dilute out any of the effects of specific irritants.  Foods that are NOT irritating to the bladder include: Pears, papayas, sun-brewed teas, watermelons, non-citrus herbal teas, apricots, kava and low-acid instant drinks (Postum)  Access Code: [email or web address]  URL: [email or web address]  Date: 11/18/2017  Prepared by: Earlie Counts   Exercises Supine Bridge with Resistance Band - 10 reps - 2 sets - 1x daily - 7x weekly Supine Transversus Abdominis Bracing with Double Leg Fallout - 10 reps - 2 sets - 1x daily - 7x weekly Supine Bridge with Mini Swiss Ball Between Knees - 10 reps - 2 sets - 1x daily - 7x weekly Prone Hip Extension - Two Pillows - 10 reps - 1 sets - 1x daily - 7x weekly  Relaxation Exercises with the Urge to Void   When you experience an urge to void:  FIRST  Stop and stand very still    Sit down if you can    Don't move    You need to stay very still to maintain control  SECOND Squeeze your pelvic floor muscles 5 times, like a quick flick, to keep from leaking  THIRD Relax  Take a deep breath and then let it out  Try to make the urge go away by using relaxation and visualization techniques  FINALLY When you feel the urge go away somewhat, walk normally to the bathroom.   If the urge gets suddenly stronger on the way, you may stop again and relax to regain control.   [place] [address], [place], [place] [address] Phone # [phone] Fax [phone]

## 2017-11-19 ENCOUNTER — Ambulatory Visit: Payer: Medicaid Other | Admitting: Family Medicine

## 2017-11-25 ENCOUNTER — Encounter: Payer: Self-pay | Admitting: Physical Therapy

## 2017-11-25 ENCOUNTER — Ambulatory Visit: Payer: Medicaid Other | Admitting: Physical Therapy

## 2017-11-25 DIAGNOSIS — M62838 Other muscle spasm: Secondary | ICD-10-CM

## 2017-11-25 DIAGNOSIS — R278 Other lack of coordination: Secondary | ICD-10-CM

## 2017-11-25 DIAGNOSIS — M6281 Muscle weakness (generalized): Secondary | ICD-10-CM

## 2017-11-25 NOTE — Therapy (Signed)
[facility] [address] [place], [place], [address] Phone: [phone]   Fax:  [phone]  Physical Therapy Treatment  Patient Details  Name: Jamie [name] MRN: [id number] Date of Birth: 1950-08-26 Referring Provider: Dr. [name]   Encounter Date: 11/25/2017  Jamie [name] End of Session - 11/25/17 1036    Visit Number  8    Date for Jamie [name] Re-Evaluation  12/13/17    Authorization Type  Medicaid    Authorization Time Period  3/4-5/5    Authorization - Visit Number  5    Authorization - Number of Visits  9    Jamie [name] Start Time  5573 came late    Jamie [name] Stop Time  1101    Jamie [name] Time Calculation (min)  38 min    Activity Tolerance  Patient tolerated treatment well    Behavior During Therapy  [facility] for tasks assessed/performed       Past Medical History:  Diagnosis Date  . Anxiety   . Depression   . Diverticulitis   . Diverticulosis   . Headache   . Hyperlipidemia   . IBS (irritable bowel syndrome)   . Lung mass   . Syncope 03/08/2015    Past Surgical History:  Procedure Laterality Date  . ABDOMINAL HYSTERECTOMY    . HEMORRHOID SURGERY    . TUBAL LIGATION    . TUMOR REMOVAL     Left thigh     There were no vitals filed for this visit.  Subjective Assessment - 11/25/17 1026    Subjective  I did not drink as much water prior to bed and get up 2-3x per night.  I have to urinate between 6-7 am is when I have to urinate. The pain will last all day or 2-3 hours. I feel a cramp on the bladder area. I can get pressure on the rectum. When patient wipes, she feel raw and stinging. When I was, I feel better.      How long can you sit comfortably?  pain level is 5-7/10 vaginal    How long can you stand comfortably?  pain level 4/10 lower vaginal    How long can you walk comfortably?  no pain    Patient Stated Goals  strengthen legs and pelvic floor to reduce urinary leakage    Currently in Pain?  Yes    Pain Score  3     Pain  Location  Abdomen    Pain Orientation  Lower    Pain Descriptors / Indicators  Cramping    Pain Type  Chronic pain    Pain Onset  More than a month ago    Pain Frequency  Intermittent    Aggravating Factors   when bladder is full    Pain Relieving Factors  release the urine , clean the perineum    Multiple Pain Sites  No                       [facility] Adult Jamie [name] Treatment/Exercise - 11/25/17 0001      Lumbar Exercises: Aerobic   Stationary Bike  level 3 x 8 min; SOB afterwards while assessing the patient      Manual Therapy   Manual Therapy  Myofascial release    Myofascial Release  one hand on lower abdoment and one hand on suprapubic area to release 3 fascial planes; release the bladder from the uterus  Jamie [name] Short Term Goals - 09/25/17 1410      Jamie [name] SHORT TERM GOAL #1   Title  independent with initial HEP    Time  4    Period  Weeks    Status  Achieved      Jamie [name] SHORT TERM GOAL #2   Title  ability to perform abdominal massage to improve mobility of intestines and reduction in pain    Time  4    Period  Weeks    Status  Achieved      Jamie [name] SHORT TERM GOAL #3   Title  understand how to toilet correctly to reduce strain on the pelvic floor     Time  4    Period  Weeks    Status  Achieved        Jamie [name] Long Term Goals - 11/18/17 1058      Jamie [name] LONG TERM GOAL #2   Title  understand ways to manage pain and relax her muscles to improve function    Baseline  just educated    Time  9    Period  Weeks    Status  On-going      Jamie [name] LONG TERM GOAL #3   Title  when holding her urine pain in vagina decreases >/= 2/10 due to improved relaxation of the tissue    Baseline  pain is 6/10    Time  9    Period  Weeks    Status  On-going      Jamie [name] LONG TERM GOAL #5   Title  urinary leakage with coughing and sneezing decreased to </= 1 time per month    Baseline  50% better    Time  4    Period  Weeks    Status  Achieved            Plan - 11/25/17  1101    Clinical Impression Statement  Patient has her pain 6-7 AM and will have to urinate 2 times during the time. Patient will have a severe cramping pain that may last several hours or all day.  Patient has tightness in the lower abdomen. Patient will benefit from skilled therapy to improve mobility of tissue, improve strength and reduce pelvic pain.      Rehab Potential  Excellent    Clinical Impairments Affecting Rehab Potential  Abdominal hysterectomy; Anxiety, Diverticulitis, IBS, and Hemorroic surgery    Jamie [name] Frequency  1x / week    Jamie [name] Duration  Other (comment) 9 weeks    Jamie [name] Treatment/Interventions  Cryotherapy;Electrical Stimulation;Moist Heat;Ultrasound;Therapeutic exercise;Therapeutic activities;Neuromuscular re-education;Patient/family education;Passive range of motion;Manual techniques;Dry needling;Energy conservation;Biofeedback    Jamie [name] Next Visit Plan  soft tissue work to left levator ani internally, abdominal strength with pelvic floor contraction gym exercises    Consulted and Agree with Plan of Care  Patient       Patient will benefit from skilled therapeutic intervention in order to improve the following deficits and impairments:  Increased fascial restricitons, Pain, Decreased coordination, Decreased scar mobility, Decreased activity tolerance, Decreased endurance, Decreased strength  Visit Diagnosis: Other muscle spasm  Muscle weakness (generalized)  Other lack of coordination     Problem List Patient Active Problem List   Diagnosis Date Noted  . Acute bilateral low back pain with bilateral sciatica 10/07/2017  . Pelvic floor weakness 09/16/2017  . Bilateral headaches 09/16/2017  . Milk allergy 09/02/2017  . Vitamin D deficiency 08/23/2017  . Iron deficiency anemia secondary to inadequate dietary iron intake  08/23/2017  . Abnormal urine odor 08/23/2017  . Atypical chest pain 08/23/2017  . Bloating 08/23/2017  . Anxiety 08/23/2017  . Carotid stenosis,  asymptomatic, bilateral 05/08/2017  . History of excessive cerumen 05/08/2017  . Cardiovascular risk factor 05/08/2017  . Gastritis and duodenitis 04/15/2017  . 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 Splenic artery aneurysm ([place]) 03/22/2017  . Vision changes 01/25/2017  . Hematuria 01/25/2017  . History of diverticulitis 01/25/2017  . Acute left lower quadrant pain 01/25/2017  . Exposure to chemical inhalation 11/06/2016  . Right knee pain 11/05/2016  . Former smoker 05/14/2016  . Hypopigmentation 01/11/2016  . Anaphylactic reaction due to food 01/11/2016  . Vaginal dryness 01/11/2016  . Multiple food allergies 01/11/2016  . 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 Carpal tunnel syndrome 10/17/2015  . Left arm numbness 10/13/2015  . Neck pain 10/13/2015  . Pernicious anemia 10/13/2015  . Chronically dry eyes 10/13/2015  . DDD (degenerative disc disease), cervical 08/31/2015  . Mid back pain on left side 07/20/2015  . Hair loss 07/20/2015  . Family history of renal cancer 07/20/2015  . History of shingles 07/19/2015  . Low iron stores 07/19/2015  . Thyroid activity decreased 07/19/2015  . Constipation 07/19/2015  . Hyperpigmentation of skin 07/19/2015  . Depression with anxiety 06/18/2015  . Normocytic anemia 06/18/2015  . Diverticulitis of large intestine with abscess without bleeding   . B12 deficiency 06/13/2015  . Migraine with aura and with status migrainosus, not intractable 04/20/2015  . Lung mass 04/20/2015  . Generalized abdominal pain 01/24/2014  . Osteopenia 01/20/2014  . Seasonal and perennial allergic rhinitis 11/06/2011  . Diverticulosis of large intestine 07/03/2011  . Hyperlipidemia 12/13/2010  . ONYCHOMYCOSIS, TOENAILS 06/09/2010  . ANXIETY DEPRESSION 06/09/2010    Jamie [name], Jamie [name] 11/25/17 11:04 AM   [facility] [address] [place], [place], [address] Phone: [phone]   Fax:   [phone]  Name: Jamie [name] MRN: [id number] Date of Birth: 1951/05/31

## 2017-12-02 ENCOUNTER — Telehealth: Payer: Self-pay | Admitting: Physician Assistant

## 2017-12-02 ENCOUNTER — Encounter: Payer: Medicaid Other | Admitting: Physical Therapy

## 2017-12-02 DIAGNOSIS — R790 Abnormal level of blood mineral: Secondary | ICD-10-CM

## 2017-12-02 DIAGNOSIS — D508 Other iron deficiency anemias: Secondary | ICD-10-CM

## 2017-12-02 DIAGNOSIS — Z8051 Family history of malignant neoplasm of kidney: Secondary | ICD-10-CM

## 2017-12-02 NOTE — Telephone Encounter (Signed)
Pt called and LVM stating she would like [name] to order blood work for her. She stated she would like a CMP and her iron checked. Thanks

## 2017-12-04 ENCOUNTER — Ambulatory Visit: Payer: Medicaid Other | Admitting: Physical Therapy

## 2017-12-04 ENCOUNTER — Other Ambulatory Visit: Payer: Self-pay | Admitting: Physician Assistant

## 2017-12-04 DIAGNOSIS — K2990 Gastroduodenitis, unspecified, without bleeding: Secondary | ICD-10-CM

## 2017-12-04 DIAGNOSIS — K2100 Gastro-esophageal reflux disease with esophagitis, without bleeding: Secondary | ICD-10-CM

## 2017-12-04 NOTE — Telephone Encounter (Signed)
Labs ordered.  LMOM notifying pt.

## 2017-12-05 ENCOUNTER — Encounter: Payer: Medicaid Other | Admitting: Physical Therapy

## 2017-12-05 ENCOUNTER — Telehealth: Payer: Self-pay | Admitting: Physician Assistant

## 2017-12-05 LAB — COMPLETE METABOLIC PANEL WITH GFR
AG Ratio: 1.6 (calc) (ref 1.0–2.5)
ALBUMIN MSPROF: 4.3 g/dL (ref 3.6–5.1)
ALT: 14 U/L (ref 6–29)
AST: 20 U/L (ref 10–35)
Alkaline phosphatase (APISO): 62 U/L (ref 33–130)
BUN: 13 mg/dL (ref 7–25)
CO2: 30 mmol/L (ref 20–32)
CREATININE: 0.82 mg/dL (ref 0.50–0.99)
Calcium: 9.7 mg/dL (ref 8.6–10.4)
Chloride: 101 mmol/L (ref 98–110)
GFR, EST AFRICAN AMERICAN: 88 mL/min/{1.73_m2} (ref >=60)
GFR, Est Non African American: 76 mL/min/{1.73_m2} (ref >=60)
GLUCOSE: 89 mg/dL (ref 65–99)
Globulin: 2.7 g/dL (calc) (ref 1.9–3.7)
Potassium: 4.7 mmol/L (ref 3.5–5.3)
Sodium: 139 mmol/L (ref 135–146)
TOTAL PROTEIN: 7 g/dL (ref 6.1–8.1)
Total Bilirubin: 0.6 mg/dL (ref 0.2–1.2)

## 2017-12-05 NOTE — Telephone Encounter (Signed)
Routing to Provider to see if these labs were needed.

## 2017-12-05 NOTE — Telephone Encounter (Signed)
Pt called. She had her bloodwork done this morning but she was a little annoyed that  CBC and Cholesterol were not added.

## 2017-12-05 NOTE — Telephone Encounter (Signed)
Pt's original request was for CMP and iron only.  She didn't state anything about wanting a CBC or lipid panel.

## 2017-12-05 NOTE — Telephone Encounter (Signed)
She just had a lipid done in September and since not on a statin she doesn't need to check until Sept.  Also he hemoglobin was normal in Jan.  It is just the iron that is being followed. Doesn't need a CBC right now.

## 2017-12-06 LAB — IRON,TIBC AND FERRITIN PANEL
%SAT: 25 % (ref 11–50)
FERRITIN: 16 ng/mL — AB (ref 20–288)
IRON: 90 ug/dL (ref 45–160)
TIBC: 362 ug/dL (ref 250–450)

## 2017-12-06 NOTE — Telephone Encounter (Signed)
Pt advised. Verbalized understanding.

## 2017-12-09 ENCOUNTER — Ambulatory Visit: Payer: Medicaid Other | Admitting: Physical Therapy

## 2017-12-09 ENCOUNTER — Encounter: Payer: Self-pay | Admitting: Physical Therapy

## 2017-12-09 DIAGNOSIS — M62838 Other muscle spasm: Secondary | ICD-10-CM

## 2017-12-09 DIAGNOSIS — M6281 Muscle weakness (generalized): Secondary | ICD-10-CM

## 2017-12-09 DIAGNOSIS — R278 Other lack of coordination: Secondary | ICD-10-CM

## 2017-12-09 NOTE — Therapy (Addendum)
[facility] [address], [place], [place], [address] Phone: [phone]   Fax:  [phone]  Physical Therapy Treatment  Patient Details  Name: Jamie [name] MRN: [id number] Date of Birth: 03-Jun-1951 Referring Provider: Dr. [name]   Encounter Date: 12/09/2017  PT End of Session - 12/09/17 1030    Visit Number  9    Date for PT Re-Evaluation  01/20/18    Authorization Type  Medicaid    Authorization Time Period  3/4-5/5    Authorization - Visit Number  4    Authorization - Number of Visits  9    PT Start Time  0786    PT Stop Time  1055    PT Time Calculation (min)  40 min    Activity Tolerance  Patient tolerated treatment well    Behavior During Therapy  [facility] for tasks assessed/performed       Past Medical History:  Diagnosis Date  . Anxiety   . Depression   . Diverticulitis   . Diverticulosis   . Headache   . Hyperlipidemia   . IBS (irritable bowel syndrome)   . Lung mass   . Syncope 03/08/2015    Past Surgical History:  Procedure Laterality Date  . ABDOMINAL HYSTERECTOMY    . HEMORRHOID SURGERY    . TUBAL LIGATION    . TUMOR REMOVAL     Left thigh     There were no vitals filed for this visit.  Subjective Assessment - 12/09/17 1022    Subjective  I called the urologist and will see him tomorrow. I hurt lower abdominal area and sides and abdomen and across my back. When wipe I feel like the area is being cut.      How long can you sit comfortably?  pain level is 5-7/10 vaginal    How long can you stand comfortably?  pain level 4/10 lower vaginal    How long can you walk comfortably?  no pain    Patient Stated Goals  strengthen legs and pelvic floor to reduce urinary leakage    Currently in Pain?  Yes    Pain Score  5     Pain Location  Abdomen    Pain Orientation  Mid    Pain Descriptors / Indicators  Throbbing;Pressure funcky sensation    Pain Type  Chronic pain    Pain Onset  More  than a month ago    Pain Frequency  Constant    Aggravating Factors   when bladder if full    Pain Relieving Factors  release uring and clean the perineum         [facility] PT Assessment - 12/09/17 0001      Assessment   Medical Diagnosis  N81.89 Weakness of pelvic floor    Referring Provider  Dr. [name]    Onset Date/Surgical Date  09/12/14    Prior Therapy  yes      Precautions   Precautions  None      Restrictions   Weight Bearing Restrictions  No      Balance Screen   Has the patient fallen in the past 6 months  No    Has the patient had a decrease in activity level because of a fear of falling?   No    Is the patient reluctant to leave their home because of a fear of falling?   No      Home Environment  Living Environment  Private residence      Prior Function   Level of Independence  Independent      Cognition   Overall Cognitive Status  Within Functional Limits for tasks assessed      Posture/Postural Control   Posture/Postural Control  No significant limitations      ROM / Strength   AROM / PROM / Strength  AROM;PROM;Strength      AROM   Overall AROM Comments  lumbar extension decreased by 25% with pain      PROM   Left Hip Flexion  115    Left Hip External Rotation   65    Left Hip Internal Rotation   30      Strength   Right Hip Flexion  4/5    Right Hip ABduction  3/5    Left Hip ABduction  3+/5      Palpation   SI assessment   pelvis in correct alignment                Pelvic Floor Special Questions - 12/09/17 0001    Pelvic Floor Internal Exam  Patient comfirms identification and approves PT to asses pelvic floor    Exam Type  Vaginal    Strength  good squeeze, good lift, able to hold agaisnt strong resistance        [facility] Adult PT Treatment/Exercise - 12/09/17 0001      Lumbar Exercises: Aerobic   Nustep  8 minutes; arms and legs level 1      Manual Therapy   Manual Therapy  Internal Pelvic Floor    Internal Pelvic  Floor  obturator internist and levator ani              PT Education - 12/09/17 1056    Education provided  Yes    Education Details  information on vaginal moisturizers and pelvic area    Person(s) Educated  Patient    Methods  Explanation;Handout    Comprehension  Verbalized understanding       PT Short Term Goals - 09/25/17 1410      PT SHORT TERM GOAL #1   Title  independent with initial HEP    Time  4    Period  Weeks    Status  Achieved      PT SHORT TERM GOAL #2   Title  ability to perform abdominal massage to improve mobility of intestines and reduction in pain    Time  4    Period  Weeks    Status  Achieved      PT SHORT TERM GOAL #3   Title  understand how to toilet correctly to reduce strain on the pelvic floor     Time  4    Period  Weeks    Status  Achieved        PT Long Term Goals - 12/09/17 1059      PT LONG TERM GOAL #1   Title  independent with HEP and how to progress herself    Baseline  still learning    Time  9    Period  Weeks    Status  On-going      PT LONG TERM GOAL #2   Title  understand ways to manage pain and relax her muscles to improve function    Baseline  just educated    Time  9    Period  Weeks    Status  On-going  PT LONG TERM GOAL #3   Title  when holding her urine pain in vagina decreases >/= 2/10 due to improved relaxation of the tissue    Baseline  pain is 10/10, going to see the doctor; pain stays for hours    Time  9    Period  Weeks    Status  On-going      PT LONG TERM GOAL #4   Title  lower abdominal pain </= 3-4/10 due to ability to relax the pelvic floor muscles with sitting and standing    Baseline  pain level 5/10; left pelvic floor muscles are hypertonic    Time  4    Period  Weeks    Status  On-going      PT LONG TERM GOAL #5   Title  urinary leakage with coughing and sneezing decreased to </= 1 time per month    Baseline  50% better    Period  Weeks    Status  Achieved             Plan - 12/09/17 1110    Clinical Impression Statement  Patient bladder pain could be a 10/10 and last for hours when she has a full bladder.  Patient has decreased strength in hip abduction and flexion averaging 4/5. Patient reports increased wetness in the vaginal area but does not feel like it is urinary leakage. Patient has increased abdominal pain in the past week.  Patient is seeing Dr. Louis Meckel tomorrow to check for any E. Coli that can be remaining in her bladder.  Patient has to urinate frequently due to the pain. Pelvic floor strength is 4/5    Rehab Potential  Excellent    Clinical Impairments Affecting Rehab Potential  Abdominal hysterectomy; Anxiety, Diverticulitis, IBS, and Hemorroic surgery    PT Frequency  1x / week    PT Duration  6 weeks    PT Treatment/Interventions  Cryotherapy;Electrical Stimulation;Moist Heat;Ultrasound;Therapeutic exercise;Therapeutic activities;Neuromuscular re-education;Patient/family education;Passive range of motion;Manual techniques;Dry needling;Energy conservation;Biofeedback    PT Next Visit Plan  soft tissue work to left levator ani internally, abdominal strength with pelvic floor contraction gym exercises; see what MD says    Recommended Other Services  renewal note sent to MD 12/09/2017    Consulted and Agree with Plan of Care  Patient       Patient will benefit from skilled therapeutic intervention in order to improve the following deficits and impairments:  Increased fascial restricitons, Pain, Decreased coordination, Decreased scar mobility, Decreased activity tolerance, Decreased endurance, Decreased strength  Visit Diagnosis: Other muscle spasm - Plan: PT plan of care cert/re-cert  Muscle weakness (generalized) - Plan: PT plan of care cert/re-cert  Other lack of coordination - Plan: PT plan of care cert/re-cert     Problem List Patient Active Problem List   Diagnosis Date Noted  . Acute bilateral low back pain with  bilateral sciatica 10/07/2017  . Pelvic floor weakness 09/16/2017  . Bilateral headaches 09/16/2017  . Milk allergy 09/02/2017  . Vitamin D deficiency 08/23/2017  . Iron deficiency anemia secondary to inadequate dietary iron intake 08/23/2017  . Abnormal urine odor 08/23/2017  . Atypical chest pain 08/23/2017  . Bloating 08/23/2017  . Anxiety 08/23/2017  . Carotid stenosis, asymptomatic, bilateral 05/08/2017  . History of excessive cerumen 05/08/2017  . Cardiovascular risk factor 05/08/2017  . Gastritis and duodenitis 04/15/2017  . 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 Splenic artery aneurysm ([place]) 03/22/2017  . Vision changes 01/25/2017  . Hematuria  01/25/2017  . History of diverticulitis 01/25/2017  . Acute left lower quadrant pain 01/25/2017  . Exposure to chemical inhalation 11/06/2016  . Right knee pain 11/05/2016  . Former smoker 05/14/2016  . Hypopigmentation 01/11/2016  . Anaphylactic reaction due to food 01/11/2016  . Vaginal dryness 01/11/2016  . Multiple food allergies 01/11/2016  . 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 Carpal tunnel syndrome 10/17/2015  . Left arm numbness 10/13/2015  . Neck pain 10/13/2015  . Pernicious anemia 10/13/2015  . Chronically dry eyes 10/13/2015  . DDD (degenerative disc disease), cervical 08/31/2015  . Mid back pain on left side 07/20/2015  . Hair loss 07/20/2015  . Family history of renal cancer 07/20/2015  . History of shingles 07/19/2015  . Low iron stores 07/19/2015  . Thyroid activity decreased 07/19/2015  . Constipation 07/19/2015  . Hyperpigmentation of skin 07/19/2015  . Depression with anxiety 06/18/2015  . Normocytic anemia 06/18/2015  . Diverticulitis of large intestine with abscess without bleeding   . B12 deficiency 06/13/2015  . Migraine with aura and with status migrainosus, not intractable 04/20/2015  . Lung mass 04/20/2015  . Generalized abdominal pain 01/24/2014  . Osteopenia 01/20/2014  . Seasonal and  perennial allergic rhinitis 11/06/2011  . Diverticulosis of large intestine 07/03/2011  . Hyperlipidemia 12/13/2010  . ONYCHOMYCOSIS, TOENAILS 06/09/2010  . ANXIETY DEPRESSION 06/09/2010    Earlie Counts, PT 12/09/17 11:19 AM   [facility] [address] [place], [place], [address] Phone: [phone]   Fax:  [phone]  Name: DABRIA WADAS MRN: [id number] Date of Birth: 1951/01/10  PHYSICAL THERAPY DISCHARGE SUMMARY  Visits from Start of Care: 9  Current functional level related to goals / functional outcomes: See above.   Remaining deficits: See above. Patient has not returned since her last visit on 12/09/2017   Education / Equipment: HEP Plan:                                                    Patient goals were partially met. Patient is being discharged due to not returning since the last visit.  Thank you for the referral. Earlie Counts, PT 12/27/17 11:24 AM  ?????

## 2017-12-09 NOTE — Telephone Encounter (Signed)
Thank you :)

## 2017-12-09 NOTE — Patient Instructions (Signed)
Moisturizers . They are used in the vagina to hydrate the mucous membrane that make up the vaginal canal. . Designed to keep a more normal acid balance (ph) . Once placed in the vagina, it will last between two to three days.  . Use 2-3 times per week at bedtime and last longer than 60 min. . Ingredients to avoid is glycerin and fragrance, can increase chance of infection . Should not be used just before sex due to causing irritation . Most are gels administered either in a tampon-shaped applicator or as a vaginal suppository. They are non-hormonal.   Types of Moisturizers . Samul Dada- drug store . Vitamin E vaginal suppositories- Whole foods, Amazon . Moist Again . Coconut oil- can break down condoms . Michail Jewels . Yes moisturizer- amazon . NeuEve Silk , NeuEve Silver for menopausal or over 65 (if have severe vaginal atrophy or cancer treatments use NeuEve Silk for  1 month than move to [employer])- [employer], MapleFlower.dk . Olive and Bee intimate cream- www.oliveandbee.com.au  Creams to use externally on the Vulva area  Albertson's (good for for cancer patients that had radiation to the area)- [place] or [employer].FlyingBasics.com.br  V-magic cream - amazon  Julva-amazon  Vital "V Wild Yam salve ( help moisturize and help with thinning vulvar area, does have [place]   Things to avoid in the vaginal area . Do not use things to irritate the vulvar area . No lotions just specialized creams for the vulva area- Neogyn, V-magic, No soaps; can use Aveeno or Calendula cleanser if needed. Must be gentle . No deodorants . No douches . Good to sleep without underwear to let the vaginal area to air out . No scrubbing: spread the lips to let warm water rinse over labias and pat dry [facility] [address] [place], [place] [address] Phone # [phone] Fax [phone]

## 2017-12-10 ENCOUNTER — Telehealth: Payer: Self-pay | Admitting: Physician Assistant

## 2017-12-10 NOTE — Telephone Encounter (Signed)
Pt called and wanted her lab results for her lab work. She wanted the actual values so I read them to her. She wants to know what can cause her Ferritin to be slightly low and what she can do to bring it up. Thanks

## 2017-12-10 NOTE — Telephone Encounter (Signed)
Ferritin is low iron stores. Hemoglobin is good so not anemia. She can increase iron rich foods. She can start OTC iron supplement. Did she try any of the things discussed a few weeks ago when I spoke with her on the phone about low iron stores?

## 2017-12-11 NOTE — Telephone Encounter (Signed)
Pt advised. She denies Rx at this time. She has been taking an iron supplement that she got online, which is what caused her most recent labs to "look better." So she will continue taking that.

## 2017-12-11 NOTE — Telephone Encounter (Signed)
Prescription ferrous sulfate 325mg  twice a day. May cause constipation. Ok to send if wants #30 11 refills.

## 2017-12-11 NOTE — Telephone Encounter (Signed)
Pt states she has been taking OTC iron, Vit C, and made dietary changes to include iron rich foods. Pt is still concerned with her ferritin being low. Pt states it has been "low or low normal for years." Are their any other options to raise, will route.

## 2017-12-12 ENCOUNTER — Other Ambulatory Visit: Payer: Self-pay | Admitting: Physician Assistant

## 2017-12-12 DIAGNOSIS — F419 Anxiety disorder, unspecified: Secondary | ICD-10-CM

## 2017-12-13 ENCOUNTER — Telehealth: Payer: Self-pay | Admitting: Physician Assistant

## 2017-12-13 ENCOUNTER — Other Ambulatory Visit: Payer: Self-pay | Admitting: Physician Assistant

## 2017-12-13 MED ORDER — ERGOCALCIFEROL 1.25 MG (50000 UT) PO CAPS: 50000.0000 [IU] | ORAL_CAPSULE | ORAL | 5 refills | Status: DC

## 2017-12-13 NOTE — Telephone Encounter (Signed)
Pt called and stated she needs a refill on her Vit. D. She is wanting to know if you can send it in with a few refills so she doesn't have to call every month to get it filled. She would like it to go to the [facility] in [place]. Thanks

## 2017-12-13 NOTE — Telephone Encounter (Signed)
Done

## 2017-12-18 ENCOUNTER — Encounter (HOSPITAL_COMMUNITY): Payer: Self-pay | Admitting: *Deleted

## 2017-12-18 ENCOUNTER — Emergency Department (HOSPITAL_COMMUNITY): Payer: Medicare Other

## 2017-12-18 ENCOUNTER — Other Ambulatory Visit: Payer: Self-pay

## 2017-12-18 ENCOUNTER — Emergency Department (HOSPITAL_COMMUNITY)
Admission: EM | Admit: 2017-12-18 | Discharge: 2017-12-18 | Disposition: A | Discharge status: Home / Self Care | Payer: Medicare Other | Attending: Emergency Medicine | Admitting: Emergency Medicine

## 2017-12-18 DIAGNOSIS — Z87891 Personal history of nicotine dependence: Secondary | ICD-10-CM | POA: Insufficient documentation

## 2017-12-18 DIAGNOSIS — Z79899 Other long term (current) drug therapy: Secondary | ICD-10-CM | POA: Diagnosis not present

## 2017-12-18 DIAGNOSIS — R079 Chest pain, unspecified: Secondary | ICD-10-CM

## 2017-12-18 DIAGNOSIS — E785 Hyperlipidemia, unspecified: Secondary | ICD-10-CM | POA: Diagnosis not present

## 2017-12-18 DIAGNOSIS — M549 Dorsalgia, unspecified: Secondary | ICD-10-CM | POA: Diagnosis not present

## 2017-12-18 LAB — CBC
HEMATOCRIT: 35.2 % — AB (ref 36.0–46.0)
Hemoglobin: 11.9 g/dL — ABNORMAL LOW (ref 12.0–15.0)
MCH: 30 pg (ref 26.0–34.0)
MCHC: 33.8 g/dL (ref 30.0–36.0)
MCV: 88.7 fL (ref 78.0–100.0)
PLATELETS: 255 10*3/uL (ref 150–400)
RBC: 3.97 MIL/uL (ref 3.87–5.11)
RDW: 12.4 % (ref 11.5–15.5)
WBC: 5.6 10*3/uL (ref 4.0–10.5)

## 2017-12-18 LAB — BASIC METABOLIC PANEL
Anion gap: 7 (ref 5–15)
BUN: 13 mg/dL (ref 6–20)
CO2: 25 mmol/L (ref 22–32)
CREATININE: 0.78 mg/dL (ref 0.44–1.00)
Calcium: 8.9 mg/dL (ref 8.9–10.3)
Chloride: 107 mmol/L (ref 101–111)
GFR calc Af Amer: >60 mL/min (ref >60)
GLUCOSE: 111 mg/dL — AB (ref 65–99)
POTASSIUM: 3.9 mmol/L (ref 3.5–5.1)
Sodium: 139 mmol/L (ref 135–145)

## 2017-12-18 LAB — I-STAT TROPONIN, ED: Troponin i, poc: 0.01 ng/mL (ref 0.00–0.08)

## 2017-12-18 NOTE — Discharge Instructions (Signed)
Work-up today looked great. Follow-up with your primary care doctor. Return here for any new/acute changes.

## 2017-12-18 NOTE — ED Triage Notes (Signed)
Pt arrived by EMS for back and chest pain. Pt was having back pain prior to going to bed, woke up with worsening back pain. Pain described as sharp making it hard to take in a deep breath. Pt took 263mg  ASA at home, EMS gave 23mcg Fentanyl with improvement of pain.
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
## 2017-12-18 NOTE — ED Notes (Signed)
Discharge instructions given to pt. , she is trying to contact her daughter to transport her back home .

## 2017-12-19 ENCOUNTER — Ambulatory Visit: Payer: Medicaid Other | Admitting: Allergy & Immunology

## 2018-01-01 ENCOUNTER — Telehealth: Payer: Self-pay | Admitting: Physician Assistant

## 2018-01-01 NOTE — Telephone Encounter (Signed)
She needs appt to justify the work up. You have had a lot of labs in last few months so we need to discuss reasons for testing.

## 2018-01-07 NOTE — Telephone Encounter (Signed)
Pt left vm today saying that she has medicare AND medicaid and really wants lab work done.

## 2018-01-13 ENCOUNTER — Encounter: Payer: Self-pay | Admitting: Physician Assistant

## 2018-01-13 ENCOUNTER — Ambulatory Visit (INDEPENDENT_AMBULATORY_CARE_PROVIDER_SITE_OTHER): Payer: Medicare Other | Admitting: Physician Assistant

## 2018-01-13 ENCOUNTER — Ambulatory Visit: Payer: Medicare Other | Admitting: Physician Assistant

## 2018-01-13 VITALS — BP 109/53 | HR 77 | Ht 64.0 in | Wt 150.0 lb

## 2018-01-13 DIAGNOSIS — M79672 Pain in left foot: Secondary | ICD-10-CM

## 2018-01-13 DIAGNOSIS — D510 Vitamin B12 deficiency anemia due to intrinsic factor deficiency: Secondary | ICD-10-CM

## 2018-01-13 DIAGNOSIS — R790 Abnormal level of blood mineral: Secondary | ICD-10-CM

## 2018-01-13 DIAGNOSIS — R5383 Other fatigue: Secondary | ICD-10-CM | POA: Diagnosis not present

## 2018-01-13 DIAGNOSIS — F419 Anxiety disorder, unspecified: Secondary | ICD-10-CM

## 2018-01-13 DIAGNOSIS — D508 Other iron deficiency anemias: Secondary | ICD-10-CM | POA: Diagnosis not present

## 2018-01-13 DIAGNOSIS — M79671 Pain in right foot: Secondary | ICD-10-CM

## 2018-01-13 DIAGNOSIS — E559 Vitamin D deficiency, unspecified: Secondary | ICD-10-CM | POA: Diagnosis not present

## 2018-01-13 DIAGNOSIS — F418 Other specified anxiety disorders: Secondary | ICD-10-CM

## 2018-01-13 DIAGNOSIS — S29012A Strain of muscle and tendon of back wall of thorax, initial encounter: Secondary | ICD-10-CM

## 2018-01-13 MED ORDER — ERGOCALCIFEROL 1.25 MG (50000 UT) PO CAPS: 50000.0000 [IU] | ORAL_CAPSULE | ORAL | 4 refills | Status: DC

## 2018-01-13 MED ORDER — FERROUS SULFATE 325 (65 FE) MG PO TBEC: 325.0000 mg | DELAYED_RELEASE_TABLET | Freq: Three times a day (TID) | ORAL | 11 refills | Status: DC

## 2018-01-13 MED ORDER — CYANOCOBALAMIN 1000 MCG/ML IJ SOLN
1000.0000 ug | Freq: Once | INTRAMUSCULAR | Status: AC
  Administered 2018-01-13: 1000 ug via INTRAMUSCULAR

## 2018-01-13 MED ORDER — ALPRAZOLAM 0.5 MG PO TABS: 0.5000 mg | ORAL_TABLET | Freq: Every day | ORAL | 0 refills | Status: DC | PRN
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
## 2018-01-14 NOTE — Progress Notes (Signed)
Call pt: your hemoglobin went up quite a bit! Way to go! Iron stores went up too. Not quite to goal but close.  Thyroid looks good.  So you must be taking vitamin supplements somewhere because your b12 was too high. No more b12 shots. Recheck in 2 months and we can see if you need any other maintenance.  Vitamin d still pending.

## 2018-01-15 DIAGNOSIS — S29012A Strain of muscle and tendon of back wall of thorax, initial encounter: Secondary | ICD-10-CM | POA: Insufficient documentation

## 2018-01-15 DIAGNOSIS — R5383 Other fatigue: Secondary | ICD-10-CM | POA: Insufficient documentation

## 2018-01-15 DIAGNOSIS — M79672 Pain in left foot: Secondary | ICD-10-CM

## 2018-01-15 DIAGNOSIS — M79671 Pain in right foot: Secondary | ICD-10-CM

## 2018-01-15 HISTORY — DX: Pain in right foot: M79.671

## 2018-01-15 HISTORY — DX: Pain in right foot: M79.672

## 2018-01-15 HISTORY — DX: Strain of muscle and tendon of back wall of thorax, initial encounter: S29.012A

## 2018-01-15 HISTORY — DX: Other fatigue: R53.83

## 2018-01-16 LAB — CBC WITH DIFFERENTIAL/PLATELET
BASOS PCT: 1.6 %
Basophils Absolute: 70 cells/uL (ref 0–200)
Eosinophils Absolute: 70 cells/uL (ref 15–500)
Eosinophils Relative: 1.6 %
HEMATOCRIT: 37.3 % (ref 35.0–45.0)
Hemoglobin: 13 g/dL (ref 11.7–15.5)
Lymphs Abs: 1311 cells/uL (ref 850–3900)
MCH: 30.5 pg (ref 27.0–33.0)
MCHC: 34.9 g/dL (ref 32.0–36.0)
MCV: 87.6 fL (ref 80.0–100.0)
MPV: 10.4 fL (ref 7.5–12.5)
Monocytes Relative: 10.1 %
Neutro Abs: 2504 cells/uL (ref 1500–7800)
Neutrophils Relative %: 56.9 %
PLATELETS: 311 10*3/uL (ref 140–400)
RBC: 4.26 10*6/uL (ref 3.80–5.10)
RDW: 12.3 % (ref 11.0–15.0)
TOTAL LYMPHOCYTE: 29.8 %
WBC: 4.4 10*3/uL (ref 3.8–10.8)
WBCMIX: 444 {cells}/uL (ref 200–950)

## 2018-01-16 LAB — TSH: TSH: 2.56 mIU/L (ref 0.40–4.50)

## 2018-01-16 LAB — T4, FREE: Free T4: 0.9 ng/dL (ref 0.8–1.8)

## 2018-01-16 LAB — VITAMIN D 1,25 DIHYDROXY
VITAMIN D 1, 25 (OH) TOTAL: 62 pg/mL (ref 18–72)
VITAMIN D2 1, 25 (OH): 29 pg/mL
Vitamin D3 1, 25 (OH)2: 33 pg/mL

## 2018-01-16 LAB — FERRITIN: Ferritin: 26 ng/mL (ref 20–288)

## 2018-01-16 LAB — VITAMIN B12

## 2018-01-16 NOTE — Progress Notes (Signed)
Call pt: vitamin D great as well.

## 2018-01-30 ENCOUNTER — Ambulatory Visit: Payer: Medicaid Other | Admitting: Podiatry

## 2018-02-03 ENCOUNTER — Other Ambulatory Visit: Payer: Self-pay | Admitting: Physician Assistant

## 2018-02-03 DIAGNOSIS — K2100 Gastro-esophageal reflux disease with esophagitis, without bleeding: Secondary | ICD-10-CM

## 2018-02-03 DIAGNOSIS — K2990 Gastroduodenitis, unspecified, without bleeding: Secondary | ICD-10-CM

## 2018-02-26 ENCOUNTER — Telehealth: Payer: Self-pay | Admitting: Physician Assistant

## 2018-02-26 NOTE — Telephone Encounter (Signed)
Error

## 2018-02-26 NOTE — Telephone Encounter (Signed)
PT requested an arteriovenous doppler to be put in.  Please advise.

## 2018-02-28 NOTE — Telephone Encounter (Signed)
Call pt: insurance will not allow [place] to order test without documentation of symptoms. Need to come in for office visit.

## 2018-03-01 ENCOUNTER — Other Ambulatory Visit: Payer: Self-pay | Admitting: Physician Assistant

## 2018-03-01 DIAGNOSIS — F419 Anxiety disorder, unspecified: Secondary | ICD-10-CM

## 2018-03-03 NOTE — Telephone Encounter (Signed)
Last OV was 01/13/2018 and Last fill of Xanax was 01/13/2018. Please advise.

## 2018-03-07 NOTE — Telephone Encounter (Signed)
-  PT stated,"Medicare will cover it. Don't need to come in for an appt" (Tried to set up an office visit)  -PT requested you call her back regarding her sleep study.  Please advise

## 2018-03-13 ENCOUNTER — Ambulatory Visit: Payer: Medicare Other | Admitting: Physician Assistant

## 2018-04-29 ENCOUNTER — Ambulatory Visit: Payer: Medicare Other | Admitting: Physician Assistant

## 2018-05-07 ENCOUNTER — Other Ambulatory Visit: Payer: Self-pay | Admitting: Physician Assistant

## 2018-05-07 ENCOUNTER — Other Ambulatory Visit: Payer: Self-pay | Admitting: Sports Medicine

## 2018-05-07 DIAGNOSIS — F419 Anxiety disorder, unspecified: Secondary | ICD-10-CM

## 2018-05-07 DIAGNOSIS — K2100 Gastro-esophageal reflux disease with esophagitis, without bleeding: Secondary | ICD-10-CM

## 2018-05-08 NOTE — Telephone Encounter (Signed)
To PCP

## 2018-06-11 ENCOUNTER — Encounter: Payer: Self-pay | Admitting: Physician Assistant

## 2018-06-11 ENCOUNTER — Ambulatory Visit: Payer: Medicare Other | Admitting: Physician Assistant

## 2018-06-11 ENCOUNTER — Ambulatory Visit (INDEPENDENT_AMBULATORY_CARE_PROVIDER_SITE_OTHER): Payer: Medicare Other | Admitting: Physician Assistant

## 2018-06-11 VITALS — BP 125/68 | HR 76 | Ht 64.0 in | Wt 146.0 lb

## 2018-06-11 DIAGNOSIS — R350 Frequency of micturition: Secondary | ICD-10-CM

## 2018-06-11 DIAGNOSIS — Z129 Encounter for screening for malignant neoplasm, site unspecified: Secondary | ICD-10-CM

## 2018-06-11 DIAGNOSIS — H6991 Unspecified Eustachian tube disorder, right ear: Secondary | ICD-10-CM

## 2018-06-11 DIAGNOSIS — F419 Anxiety disorder, unspecified: Secondary | ICD-10-CM

## 2018-06-11 DIAGNOSIS — E559 Vitamin D deficiency, unspecified: Secondary | ICD-10-CM

## 2018-06-11 DIAGNOSIS — Z113 Encounter for screening for infections with a predominantly sexual mode of transmission: Secondary | ICD-10-CM

## 2018-06-11 DIAGNOSIS — Z79899 Other long term (current) drug therapy: Secondary | ICD-10-CM | POA: Diagnosis not present

## 2018-06-11 DIAGNOSIS — E538 Deficiency of other specified B group vitamins: Secondary | ICD-10-CM | POA: Diagnosis not present

## 2018-06-11 DIAGNOSIS — H6981 Other specified disorders of Eustachian tube, right ear: Secondary | ICD-10-CM

## 2018-06-11 DIAGNOSIS — I6523 Occlusion and stenosis of bilateral carotid arteries: Secondary | ICD-10-CM

## 2018-06-11 DIAGNOSIS — E785 Hyperlipidemia, unspecified: Secondary | ICD-10-CM

## 2018-06-11 DIAGNOSIS — Z13820 Encounter for screening for osteoporosis: Secondary | ICD-10-CM

## 2018-06-11 DIAGNOSIS — Z780 Asymptomatic menopausal state: Secondary | ICD-10-CM

## 2018-06-11 DIAGNOSIS — Z7251 High risk heterosexual behavior: Secondary | ICD-10-CM | POA: Diagnosis not present

## 2018-06-11 HISTORY — DX: Unspecified eustachian tube disorder, right ear: H69.91

## 2018-06-11 LAB — POCT URINALYSIS DIPSTICK
Bilirubin, UA: NEGATIVE
Glucose, UA: NEGATIVE
Ketones, UA: NEGATIVE
Nitrite, UA: NEGATIVE
PH UA: 6 (ref 5.0–8.0)
Protein, UA: NEGATIVE
RBC UA: NEGATIVE
SPEC GRAV UA: 1.01 (ref 1.010–1.025)
Urobilinogen, UA: 0.2 E.U./dL

## 2018-06-11 MED ORDER — METHYLPREDNISOLONE SODIUM SUCC 125 MG IJ SOLR
125.0000 mg | Freq: Once | INTRAMUSCULAR | Status: AC
  Administered 2018-06-11: 125 mg via INTRAMUSCULAR

## 2018-06-11 MED ORDER — ALPRAZOLAM 0.5 MG PO TABS: ORAL_TABLET | ORAL | 0 refills | Status: DC
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
## 2018-06-12 LAB — URINE CULTURE
MICRO NUMBER:: 91306680
SPECIMEN QUALITY:: ADEQUATE

## 2018-06-13 ENCOUNTER — Encounter: Payer: Self-pay | Admitting: Physician Assistant

## 2018-06-13 DIAGNOSIS — R350 Frequency of micturition: Secondary | ICD-10-CM | POA: Insufficient documentation

## 2018-06-13 HISTORY — DX: Frequency of micturition: R35.0

## 2018-06-13 NOTE — Progress Notes (Signed)
Call pt: no STD's.  No UTI.  b12 back down. Are you doing any b12 supplements? When was last shot?  Thyroid normal.  Kidney, liver, glucose look great.  Cholesterol is better. HDL great!!! Keep up to the good work.

## 2018-06-15 LAB — C. TRACHOMATIS/N. GONORRHOEAE RNA
C. TRACHOMATIS RNA, TMA: NOT DETECTED
N. gonorrhoeae RNA, TMA: NOT DETECTED

## 2018-06-15 LAB — COMPLETE METABOLIC PANEL WITH GFR
AG Ratio: 1.5 (calc) (ref 1.0–2.5)
ALKALINE PHOSPHATASE (APISO): 59 U/L (ref 33–130)
ALT: 15 U/L (ref 6–29)
AST: 24 U/L (ref 10–35)
Albumin: 4.3 g/dL (ref 3.6–5.1)
BILIRUBIN TOTAL: 0.4 mg/dL (ref 0.2–1.2)
BUN: 13 mg/dL (ref 7–25)
CO2: 29 mmol/L (ref 20–32)
Calcium: 9.9 mg/dL (ref 8.6–10.4)
Chloride: 103 mmol/L (ref 98–110)
Creat: 0.78 mg/dL (ref 0.50–0.99)
GFR, EST AFRICAN AMERICAN: 92 mL/min/{1.73_m2} (ref >=60)
GFR, Est Non African American: 80 mL/min/{1.73_m2} (ref >=60)
GLOBULIN: 2.8 g/dL (ref 1.9–3.7)
GLUCOSE: 96 mg/dL (ref 65–99)
POTASSIUM: 4.5 mmol/L (ref 3.5–5.3)
Sodium: 140 mmol/L (ref 135–146)
TOTAL PROTEIN: 7.1 g/dL (ref 6.1–8.1)

## 2018-06-15 LAB — VITAMIN D 1,25 DIHYDROXY
VITAMIN D 1, 25 (OH) TOTAL: 52 pg/mL (ref 18–72)
VITAMIN D2 1, 25 (OH): 14 pg/mL
VITAMIN D3 1, 25 (OH): 38 pg/mL

## 2018-06-15 LAB — HIV ANTIBODY (ROUTINE TESTING W REFLEX): HIV: NONREACTIVE

## 2018-06-15 LAB — LIPID PANEL W/REFLEX DIRECT LDL
Cholesterol: 227 mg/dL — ABNORMAL HIGH (ref <200)
HDL: 74 mg/dL (ref >50)
LDL Cholesterol (Calc): 136 mg/dL (calc) — ABNORMAL HIGH
NON-HDL CHOLESTEROL (CALC): 153 mg/dL — AB (ref <130)
Total CHOL/HDL Ratio: 3.1 (calc) (ref <5.0)
Triglycerides: 80 mg/dL (ref <150)

## 2018-06-15 LAB — B12 AND FOLATE PANEL
Folate: >24 ng/mL
Vitamin B-12: 240 pg/mL (ref 200–1100)

## 2018-06-15 LAB — CA 125: CA 125: 18 U/mL (ref <35)

## 2018-06-15 LAB — TSH: TSH: 1.04 m[IU]/L (ref 0.40–4.50)

## 2018-06-15 LAB — RPR: RPR: NONREACTIVE

## 2018-06-16 ENCOUNTER — Ambulatory Visit: Payer: Medicare Other | Admitting: Physician Assistant

## 2018-06-17 ENCOUNTER — Other Ambulatory Visit: Payer: Self-pay | Admitting: Physician Assistant

## 2018-06-17 DIAGNOSIS — Z1231 Encounter for screening mammogram for malignant neoplasm of breast: Secondary | ICD-10-CM

## 2018-06-18 ENCOUNTER — Telehealth: Payer: Self-pay

## 2018-06-18 NOTE — Telephone Encounter (Signed)
CA125- it was in normal range.

## 2018-06-18 NOTE — Telephone Encounter (Signed)
Jamie [name] called to see if the cancer gene screening test is back. I did not see cancer gene screening in the lab work. Please advise.

## 2018-06-19 NOTE — Telephone Encounter (Signed)
Patient advised.

## 2018-06-20 ENCOUNTER — Ambulatory Visit (HOSPITAL_BASED_OUTPATIENT_CLINIC_OR_DEPARTMENT_OTHER): Payer: Medicare Other

## 2018-06-21 ENCOUNTER — Other Ambulatory Visit (HOSPITAL_BASED_OUTPATIENT_CLINIC_OR_DEPARTMENT_OTHER): Payer: Medicare Other

## 2018-06-25 ENCOUNTER — Ambulatory Visit: Payer: Medicare Other | Admitting: Physician Assistant

## 2018-06-25 ENCOUNTER — Ambulatory Visit (HOSPITAL_BASED_OUTPATIENT_CLINIC_OR_DEPARTMENT_OTHER): Admission: RE | Admit: 2018-06-25 | Payer: Medicare Other | Source: Ambulatory Visit

## 2018-06-25 ENCOUNTER — Ambulatory Visit (HOSPITAL_BASED_OUTPATIENT_CLINIC_OR_DEPARTMENT_OTHER)
Admission: RE | Admit: 2018-06-25 | Discharge: 2018-06-25 | Disposition: A | Discharge status: Home / Self Care | Payer: Medicare Other | Source: Ambulatory Visit | Attending: Physician Assistant | Admitting: Physician Assistant

## 2018-06-25 ENCOUNTER — Other Ambulatory Visit: Payer: Self-pay | Admitting: Physician Assistant

## 2018-06-25 ENCOUNTER — Ambulatory Visit (HOSPITAL_BASED_OUTPATIENT_CLINIC_OR_DEPARTMENT_OTHER): Payer: Medicare Other

## 2018-06-25 DIAGNOSIS — I6523 Occlusion and stenosis of bilateral carotid arteries: Secondary | ICD-10-CM | POA: Insufficient documentation

## 2018-06-25 DIAGNOSIS — K2100 Gastro-esophageal reflux disease with esophagitis, without bleeding: Secondary | ICD-10-CM

## 2018-06-26 NOTE — Progress Notes (Signed)
Stable carotid doppler at less than 50 percent narrowing bilaterally.

## 2018-06-27 NOTE — Progress Notes (Signed)
Pt has seen results on MyChart and message also sent for patient to call back if any questions.

## 2018-07-01 ENCOUNTER — Ambulatory Visit: Payer: Medicare Other | Admitting: Physician Assistant

## 2018-07-02 ENCOUNTER — Ambulatory Visit: Payer: Medicare Other

## 2018-07-02 ENCOUNTER — Other Ambulatory Visit: Payer: Medicare Other

## 2018-08-14 ENCOUNTER — Other Ambulatory Visit: Payer: Self-pay | Admitting: Physician Assistant

## 2018-08-14 DIAGNOSIS — K2100 Gastro-esophageal reflux disease with esophagitis, without bleeding: Secondary | ICD-10-CM

## 2018-08-14 MED ORDER — OMEPRAZOLE 40 MG PO CPDR: 40.0000 mg | DELAYED_RELEASE_CAPSULE | Freq: Every day | ORAL | 0 refills | Status: DC

## 2018-08-19 ENCOUNTER — Ambulatory Visit: Payer: Medicare Other | Admitting: Physician Assistant

## 2018-08-19 ENCOUNTER — Encounter: Payer: Self-pay | Admitting: Physician Assistant

## 2018-08-19 ENCOUNTER — Ambulatory Visit (INDEPENDENT_AMBULATORY_CARE_PROVIDER_SITE_OTHER): Payer: Medicare Other | Admitting: Physician Assistant

## 2018-08-19 VITALS — BP 141/93 | HR 90 | Wt 148.0 lb

## 2018-08-19 DIAGNOSIS — N760 Acute vaginitis: Secondary | ICD-10-CM | POA: Diagnosis not present

## 2018-08-19 DIAGNOSIS — R300 Dysuria: Secondary | ICD-10-CM | POA: Diagnosis not present

## 2018-08-19 DIAGNOSIS — Z202 Contact with and (suspected) exposure to infections with a predominantly sexual mode of transmission: Secondary | ICD-10-CM

## 2018-08-19 DIAGNOSIS — N952 Postmenopausal atrophic vaginitis: Secondary | ICD-10-CM | POA: Diagnosis not present

## 2018-08-19 LAB — POCT URINALYSIS DIPSTICK
Bilirubin, UA: NEGATIVE
Blood, UA: NEGATIVE
Glucose, UA: NEGATIVE
KETONES UA: NEGATIVE
Nitrite, UA: NEGATIVE
Protein, UA: NEGATIVE
Spec Grav, UA: 1.02 (ref 1.010–1.025)
Urobilinogen, UA: 0.2 E.U./dL
pH, UA: 6.5 (ref 5.0–8.0)

## 2018-08-19 MED ORDER — PRASTERONE 6.5 MG VA INST: 1.0000 "application " | VAGINAL_INSERT | Freq: Every day | VAGINAL | 6 refills | Status: DC

## 2018-08-19 NOTE — Patient Instructions (Addendum)
Genital Herpes Genital herpes is a common sexually transmitted infection (STI) that is caused by a virus. The virus spreads from person to person through sexual contact. Infection can cause itching, blisters, and sores around the genitals or rectum. Symptoms may last several days and then go away This is called an outbreak. However, the virus remains in your body, so you may have more outbreaks in the future. The time between outbreaks varies and can be months or years. Genital herpes affects men and women. It is particularly concerning for pregnant women because the virus can be passed to the baby during delivery and can cause serious problems. Genital herpes is also a concern for people who have a weak disease-fighting (immune) system. What are the causes? This condition is caused by the herpes simplex virus (HSV) type 1 or type 2. The virus may spread through:  Sexual contact with an infected person, including vaginal, anal, and oral sex.  Contact with fluid from a herpes sore.  The skin. This means that you can get herpes from an infected partner even if he or she does not have a visible sore or does not know that he or she is infected. What increases the risk? You are more likely to develop this condition if:  You have sex with many partners.  You do not use latex condoms during sex. What are the signs or symptoms? Most people do not have symptoms (asymptomatic) or have mild symptoms that may be mistaken for other skin problems. Symptoms may include:  Small red bumps near the genitals, rectum, or mouth. These bumps turn into blisters and then turn into sores.  Flu-like symptoms, including: ? Fever. ? Body aches. ? Swollen lymph nodes. ? Headache.  Painful urination.  Pain and itching in the genital area or rectal area.  Vaginal discharge.  Tingling or shooting pain in the legs and buttocks. Generally, symptoms are more severe and last longer during the first (primary)  outbreak. Flu-like symptoms are also more common during the primary outbreak. How is this diagnosed? Genital herpes may be diagnosed based on:  A physical exam.  Your medical history.  Blood tests.  A test of a fluid sample (culture) from an open sore. How is this treated? There is no cure for this condition, but treatment with antiviral medicines that are taken by mouth (orally) can do the following:  Speed up healing and relieve symptoms.  Help to reduce the spread of the virus to sexual partners.  Limit the chance of future outbreaks, or make future outbreaks shorter.  Lessen symptoms of future outbreaks. Your health care provider may also recommend pain relief medicines, such as aspirin or ibuprofen. Follow these instructions at home: Sexual activity  Do not have sexual contact during active outbreaks.  Practice safe sex. Latex condoms and female condoms may help prevent the spread of the herpes virus. General instructions  Keep the affected areas dry and clean.  Take over-the-counter and prescription medicines only as told by your health care provider.  Avoid rubbing or touching blisters and sores. If you do touch blisters or sores: ? Wash your hands thoroughly with soap and water. ? Do not touch your eyes afterward.  To help relieve pain or itching, you may take the following actions as directed by your health care provider: ? Apply a cold, wet cloth (cold compress) to affected areas 4-6 times a day. ? Apply a substance that protects your skin and reduces bleeding (astringent). ? Apply a gel that  helps relieve pain around sores (lidocaine gel). ? Take a warm, shallow bath that cleans the genital area (sitz bath).  Keep all follow-up visits as told by your health care provider. This is important. How is this prevented?  Use condoms. Although anyone can get genital herpes during sexual contact, even with the use of a condom, a condom can provide some  protection.  Avoid having multiple sexual partners.  Talk with your sexual partner about any symptoms either of you may have. Also, talk with your partner about any history of STIs.  Get tested for STIs before you have sex. Ask your partner to do the same.  Do not have sexual contact if you have symptoms of genital herpes. Contact a health care provider if:  Your symptoms are not improving with medicine.  Your symptoms return.  You have new symptoms.  You have a fever.  You have abdominal pain.  You have redness, swelling, or pain in your eye.  You notice new sores on other parts of your body.  You are a woman and experience bleeding between menstrual periods.  You have had herpes and you become pregnant or plan to become pregnant. Summary  Genital herpes is a common sexually transmitted infection (STI) that is caused by the herpes simplex virus (HSV) type 1 or type 2.  These viruses are most often spread through sexual contact with an infected person.  You are more likely to develop this condition if you have sex with many partners or you have unprotected sex.  Most people do not have symptoms (asymptomatic) or have mild symptoms that may be mistaken for other skin problems. Symptoms occur as outbreaks that may happen months or years apart.  There is no cure for this condition, but treatment with oral antiviral medicines can reduce symptoms, reduce the chance of spreading the virus to a partner, prevent future outbreaks, or shorten future outbreaks. This information is not intended to replace advice given to you by your health care provider. Make sure you discuss any questions you have with your health care provider. Document Released: 07/27/2000 Document Revised: 06/29/2016 Document Reviewed: 06/29/2016 Elsevier Interactive Patient Education  2019 [employer]    Atrophic Vaginitis  Atrophic vaginitis is a condition in which the tissues that line the vagina become  dry and thin. This condition is most common in women who have stopped having regular menstrual periods (are in menopause). This usually starts when a woman is 64-74 years old. That is the time when a woman's estrogen levels begin to drop (decrease). Estrogen is a female hormone. It helps to keep the tissues of the vagina moist. It stimulates the vagina to produce a clear fluid that lubricates the vagina for sexual intercourse. This fluid also protects the vagina from infection. Lack of estrogen can cause the lining of the vagina to get thinner and dryer. The vagina may also shrink in size. It may become less elastic. Atrophic vaginitis tends to get worse over time as a woman's estrogen level drops. What are the causes? This condition is caused by the normal drop in estrogen that happens around the time of menopause. What increases the risk? Certain conditions or situations may lower a woman's estrogen level, leading to a higher risk for atrophic vaginitis. You are more likely to develop this condition if:  You are taking medicines that block estrogen.  You have had your ovaries removed.  You are being treated for cancer with X-ray (radiation) or medicines (chemotherapy).  You  have given birth or are breastfeeding.  You are older than age 73.  You smoke. What are the signs or symptoms? Symptoms of this condition include:  Pain, soreness, or bleeding during sexual intercourse (dyspareunia).  Vaginal burning, irritation, or itching.  Pain or bleeding when a speculum is used in a vaginal exam (pelvic exam).  Having burning pain when passing urine.  Vaginal discharge that is brown or yellow. In some cases, there are no symptoms. How is this diagnosed? This condition is diagnosed by taking a medical history and doing a physical exam. This will include a pelvic exam that checks the vaginal tissues. Though rare, you may also have other tests, including:  A urine test.  A test that checks  the acid balance in your vagina (acid balance test). How is this treated? Treatment for this condition depends on how severe your symptoms are. Treatment may include:  Using an over-the-counter vaginal lubricant before sex.  Using a long-acting vaginal moisturizer.  Using low-dose vaginal estrogen for moderate to severe symptoms that do not respond to other treatments. Options include creams, tablets, and inserts (vaginal rings). Before you use a vaginal estrogen, tell your health care provider if you have a history of: ? Breast cancer. ? Endometrial cancer. ? Blood clots. If you are not sexually active and your symptoms are very mild, you may not need treatment. Follow these instructions at home: Medicines  Take over-the-counter and prescription medicines only as told by your health care provider. Do not use herbal or alternative medicines unless your health care provider says that you can.  Use over-the-counter creams, lubricants, or moisturizers for dryness only as directed by your health care provider. General instructions  If your atrophic vaginitis is caused by menopause, discuss all of your menopause symptoms and treatment options with your health care provider.  Do not douche.  Do not use products that can make your vagina dry. These include: ? Scented feminine sprays. ? Scented tampons. ? Scented soaps.  Vaginal intercourse can help to improve blood flow and elasticity of vaginal tissue. If it hurts to have sex, try using a lubricant or moisturizer just before having intercourse. Contact a health care provider if:  Your discharge looks different than normal.  Your vagina has an unusual smell.  You have new symptoms.  Your symptoms do not improve with treatment.  Your symptoms get worse. Summary  Atrophic vaginitis is a condition in which the tissues that line the vagina become dry and thin. It is most common in women who have stopped having regular menstrual  periods (are in menopause).  Treatment options include using vaginal lubricants and low-dose vaginal estrogen.  Contact a health care provider if your vagina has an unusual smell, or if your symptoms get worse or do not improve after treatment. This information is not intended to replace advice given to you by your health care provider. Make sure you discuss any questions you have with your health care provider. Document Released: 12/14/2014 Document Revised: 04/25/2017 Document Reviewed: 04/25/2017 Elsevier Interactive Patient Education  2019 [employer].

## 2018-08-19 NOTE — Progress Notes (Signed)
HPI:                                                                Jamie [name] is a 68 y.o. female who presents to [facility]: [place] today for vaginitis/dysuria  Patient reports anal and vaginal discomfort associated with a rash x 2 weeks. Symptoms began following oral and vaginal intercourse with female partner. They did not use a condom. She states the rash and discomfort has been gradually improving, but she states there are still 2 lesions. She has had some pain with urination. Denies fever, chills, malaise, abdominal/pelvic pain, hematuria, frequency/urgency. Denies history of STI.   She reports chronic history of dyspareunia. She has tried vaginal hormone treatments in the past.   Past Medical History:  Diagnosis Date  . Anxiety   . Depression   . Diverticulitis   . Diverticulosis   . Headache   . Hyperlipidemia   . IBS (irritable bowel syndrome)   . Lung mass   . Syncope 03/08/2015   Past Surgical History:  Procedure Laterality Date  . ABDOMINAL HYSTERECTOMY    . HEMORRHOID SURGERY    . TUBAL LIGATION    . TUMOR REMOVAL     Left thigh    Social History   Tobacco Use  . Smoking status: Former Smoker    Packs/day: 0.80    Years: 20.00    Pack years: 16.00    Types: Cigarettes    Last attempt to quit: 08/13/1985    Years since quitting: 33.0  . Smokeless tobacco: Never Used  Substance Use Topics  . Alcohol use: Yes    Comment: rare   family history includes Alcohol abuse in her sister; Allergic rhinitis in her daughter and sister; Arthritis in her sister; Asthma in her sister; Cervical cancer in her sister; Diabetes in her mother; Emphysema in her sister and sister; Fibromyalgia in her sister; Heart disease in her brother and father; Irritable bowel syndrome in her sister; Leukemia in her unknown relative; Liver cancer in her mother; Pancreatic cancer (age of onset: 62) in her sister; Urticaria in her daughter.    ROS:  negative except as noted in the HPI  Medications: Current Outpatient Medications  Medication Sig Dispense Refill  . ALPRAZolam (XANAX) 0.5 MG tablet TAKE 1 TABLET(0.5 MG) BY MOUTH DAILY AS NEEDED FOR ANXIETY 90 tablet 0  . B-D 3CC LUER-LOK SYR 25GX1/2" 25G X 1-1/2" 3 ML MISC USE TO INJECT INTO MUSCLE FOR 1 DOSE  0  . EPINEPHrine (AUVI-Q) 0.3 mg/0.3 mL IJ SOAJ injection Use as directed for severe allergic reactions (Patient taking differently: Inject 0.3 mg into the muscle as needed (for severe allergic reactions). ) 2 Device 1  . ergocalciferol (VITAMIN D2) 50000 units capsule Take 1 capsule (50,000 Units total) by mouth once a week. 12 capsule 4  . ferrous sulfate 325 (65 FE) MG EC tablet Take 1 tablet (325 mg total) by mouth 3 (three) times daily with meals. 90 tablet 11  . metroNIDAZOLE (FLAGYL) 500 MG tablet Take 1 tablet (500 mg total) by mouth 2 (two) times daily for 7 days. 14 tablet 0  . NON FORMULARY Take 1 capsule by mouth 3 (three) times daily. Herbal supplements for gastritis    .  omeprazole (PRILOSEC) 40 MG capsule Take 1 capsule (40 mg total) by mouth daily. 90 capsule 0  . Prasterone (INTRAROSA) 6.5 MG INST Place 1 application vaginally at bedtime. 28 each 6   No current facility-administered medications for this visit.    Allergies  Allergen Reactions  . Iodine Rash  . Ciprofloxacin     Facial numbness  . Iodinated Diagnostic Agents Other (See Comments)    Face and chest felt "hot", throat felt tight.   . Peanut Butter Flavor Nausea And Vomiting    N/V  . Shellfish Allergy Swelling       Objective:  BP (!) 141/93   Pulse 90   Wt 148 lb (67.1 kg)   BMI 25.40 kg/m  Gen:  alert, not ill-appearing, no distress, appropriate for age GI: abdomen soft, nontender, no CVA tenderness GU: single vesicular looking lesion of the left lower introitus/labia minora, vaginal tissues appear pale on limited external exam, speculum exam deffered Lymph: no inguinal adenopathy MSK:  extremities atraumatic, normal gait and station  Chaperone was used for the GU exam, Darrall Dears, RMA  No results found for this or any previous visit (from the past 72 hour(s)). No results found.    Assessment and Plan: 68 y.o. female with   .Diagnoses and all orders for this visit:  Contact with and (suspected) exposure to infections with a predominantly sexual mode of transmission -     Hepatitis C antibody -     HIV Antibody (routine testing w rflx) -     RPR -     SureSwab, Vaginosis/Vaginitis Plus -     Herpes simplex virus culture -     HSV 1/2 Ab (IgM), IFA w/rflx Titer  Acute vaginitis -     HSV 1/2 Ab (IgM), IFA w/rflx Titer -     Urine Culture -     POCT Urinalysis Dipstick  Atrophic vaginitis -     Prasterone (INTRAROSA) 6.5 MG INST; Place 1 application vaginally at bedtime.  Dysuria -     Urine Culture -     POCT Urinalysis Dipstick   Discussed with patient that after 2 weeks of symptoms, it is difficult to make a clinical diagnosis of HSV, since infection will resolve on its own. Viral culture is pending, but may be falsely negative due to the stage of healing that the lesion is in. Discussed limitations of serum HSV testing; that this can confirm presence of antibodies indicating past infection  STI testing pending  POC UA positive for small leuks today. Leukocytes have been present in majority of her urine samples going back to 06/2017. Dysuria is likely due to the vaginitis. Urine cx pending   Atrophic vaginitis/Dyspareunia - Intrarosa application at bedtime  Patient education and anticipatory guidance given Patient agrees with treatment plan Follow-up as needed if symptoms worsen or fail to improve  Darlyne Russian PA-C

## 2018-08-20 LAB — RPR: RPR Ser Ql: NONREACTIVE

## 2018-08-20 LAB — HEPATITIS C ANTIBODY
HEP C AB: NONREACTIVE
SIGNAL TO CUT-OFF: 0.01 (ref <1.00)

## 2018-08-20 LAB — URINE CULTURE
MICRO NUMBER: 21964
Result:: NO GROWTH
SPECIMEN QUALITY:: ADEQUATE

## 2018-08-20 LAB — HIV ANTIBODY (ROUTINE TESTING W REFLEX): HIV 1&2 Ab, 4th Generation: NONREACTIVE

## 2018-08-22 ENCOUNTER — Other Ambulatory Visit: Payer: Self-pay | Admitting: Osteopathic Medicine

## 2018-08-22 LAB — SURESWAB, VAGINOSIS/VAGINITIS PLUS
Atopobium vaginae: NOT DETECTED
C. PARAPSILOSIS, DNA: NOT DETECTED
C. TRACHOMATIS RNA, TMA: NOT DETECTED
C. TROPICALIS, DNA: NOT DETECTED
C. albicans, DNA: NOT DETECTED
C. glabrata, DNA: NOT DETECTED
Gardnerella vaginalis: 7.5 Log (cells/mL)
LACTOBACILLUS SPECIES: NOT DETECTED
MEGASPHAERA SPECIES: NOT DETECTED
N. GONORRHOEAE RNA, TMA: NOT DETECTED
Trichomonas vaginalis RNA: NOT DETECTED

## 2018-08-22 LAB — HERPES SIMPLEX VIRUS CULTURE
MICRO NUMBER: 21955
SPECIMEN QUALITY: ADEQUATE

## 2018-08-22 MED ORDER — METRONIDAZOLE 500 MG PO TABS: 500.0000 mg | ORAL_TABLET | Freq: Two times a day (BID) | ORAL | 0 refills | Status: AC

## 2018-08-23 LAB — HSV 1/2 AB (IGM), IFA W/RFLX TITER
HSV 1 IGM SCREEN: NEGATIVE
HSV 2 IGM SCREEN: NEGATIVE

## 2018-08-27 ENCOUNTER — Telehealth: Payer: Self-pay | Admitting: Physician Assistant

## 2018-08-27 NOTE — Telephone Encounter (Signed)
We received a letter from your insurance regarding you being on a statin to reduce your overall cardiovascular risks. I see that you have tried multiple statins before; were you intolerant to them? If so, there is a new statin on the market called Livalo which is an option you could try if you are interested.

## 2018-08-27 NOTE — Progress Notes (Deleted)
Subjective:   Jamie [name] is a 68 y.o. female who presents for an Initial Medicare Annual Wellness Visit.  Review of Systems    No ROS.  Medicare Wellness Visit. Additional risk factors are reflected in the social history.     Sleep patterns: Home Safety/Smoke Alarms: Feels safe in home. Smoke alarms in place.  Living environment;  Seat Belt Safety/Bike Helmet: Wears seat belt.   Female:   Pap-   Aged out    Mammo-       Dexa scan-        CCS- utd     Objective:    There were no vitals filed for this visit. There is no height or weight on file to calculate BMI.  Advanced Directives 09/12/2017 07/17/2017 06/09/2017 03/05/2017 02/04/2017 12/09/2016 11/13/2016  Does Patient Have a Medical Advance Directive? No No No No No No No  Would patient like information on creating a medical advance directive? - - - - - No - Patient declined No - Patient declined  Pre-existing out of facility DNR order (yellow form or pink MOST form) - - - - - - -    Current Medications (verified) Outpatient Encounter Medications as of 09/02/2018  Medication Sig  . ALPRAZolam (XANAX) 0.5 MG tablet TAKE 1 TABLET(0.5 MG) BY MOUTH DAILY AS NEEDED FOR ANXIETY  . B-D 3CC LUER-LOK SYR 25GX1/2" 25G X 1-1/2" 3 ML MISC USE TO INJECT INTO MUSCLE FOR 1 DOSE  . EPINEPHrine (AUVI-Q) 0.3 mg/0.3 mL IJ SOAJ injection Use as directed for severe allergic reactions (Patient taking differently: Inject 0.3 mg into the muscle as needed (for severe allergic reactions). )  . ergocalciferol (VITAMIN D2) 50000 units capsule Take 1 capsule (50,000 Units total) by mouth once a week.  . ferrous sulfate 325 (65 FE) MG EC tablet Take 1 tablet (325 mg total) by mouth 3 (three) times daily with meals.  . metroNIDAZOLE (FLAGYL) 500 MG tablet Take 1 tablet (500 mg total) by mouth 2 (two) times daily for 7 days.  . NON FORMULARY Take 1 capsule by mouth 3 (three) times daily. Herbal supplements for gastritis  . omeprazole (PRILOSEC) 40 MG  capsule Take 1 capsule (40 mg total) by mouth daily.  . Prasterone (INTRAROSA) 6.5 MG INST Place 1 application vaginally at bedtime.   No facility-administered encounter medications on file as of 09/02/2018.     Allergies (verified) Iodine; Ciprofloxacin; Iodinated diagnostic agents; Peanut butter flavor; and Shellfish allergy   History: Past Medical History:  Diagnosis Date  . Anxiety   . Depression   . Diverticulitis   . Diverticulosis   . Headache   . Hyperlipidemia   . IBS (irritable bowel syndrome)   . Lung mass   . Syncope 03/08/2015   Past Surgical History:  Procedure Laterality Date  . ABDOMINAL HYSTERECTOMY    . HEMORRHOID SURGERY    . TUBAL LIGATION    . TUMOR REMOVAL     Left thigh    Family History  Problem Relation Age of Onset  . Diabetes Mother   . Liver cancer Mother        renal cell   . Heart disease Father        first MI in 56s  . Cervical cancer Sister   . Allergic rhinitis Sister   . Asthma Sister   . Heart disease Brother        first MI in 19s  . Alcohol abuse Sister   . Fibromyalgia  Sister   . Pancreatic cancer Sister 63  . Arthritis Sister        osteoarthritis  . Emphysema Sister   . Irritable bowel syndrome Sister   . Emphysema Sister   . Leukemia Unknown        grandson  . Urticaria Daughter   . Allergic rhinitis Daughter   . Stomach cancer Neg Hx   . Colon cancer Neg Hx   . Angioedema Neg Hx   . Eczema Neg Hx   . Immunodeficiency Neg Hx    Social History   Socioeconomic History  . Marital status: Divorced    Spouse name: Not on file  . Number of children: 3  . Years of education: Not on file  . Highest education level: Not on file  Occupational History  . Occupation: Ship broker  Social Needs  . Financial resource strain: Not on file  . Food insecurity:    Worry: Not on file    Inability: Not on file  . Transportation needs:    Medical: Not on file    Non-medical: Not on file  Tobacco Use  . Smoking status: Former  Smoker    Packs/day: 0.80    Years: 20.00    Pack years: 16.00    Types: Cigarettes    Last attempt to quit: 08/13/1985    Years since quitting: 33.0  . Smokeless tobacco: Never Used  Substance and Sexual Activity  . Alcohol use: Yes    Comment: rare  . Drug use: No  . Sexual activity: Not Currently    Partners: Male    Birth control/protection: None  Lifestyle  . Physical activity:    Days per week: Not on file    Minutes per session: Not on file  . Stress: Not on file  Relationships  . Social connections:    Talks on phone: Not on file    Gets together: Not on file    Attends religious service: Not on file    Active member of club or organization: Not on file    Attends meetings of clubs or organizations: Not on file    Relationship status: Not on file  Other Topics Concern  . Not on file  Social History Narrative   Lives with and grandchild (she is foster parent for grand child) Completed school full time.   Caffeine use; occasional   Works in Personal assistant, Economist.   In process of divorce.         Tobacco Counseling Counseling given: Not Answered   Clinical Intake:                        Activities of Daily Living No flowsheet data found.   Immunizations and Health Maintenance Immunization History  Administered Date(s) Administered  . Td 08/13/2005   Health Maintenance Due  Topic Date Due  . PNA vac Low Risk Adult (1 of 2 - PCV13) 12/19/2017  . DEXA SCAN  05/23/2018    Patient Care Team: Lavada Mesi as PCP - General (Family Medicine)  Indicate any recent Medical Services you may have received from other than [facility] providers in the past year (date may be approximate).     Assessment:   This is a routine wellness examination for [facility].Physical assessment deferred to PCP.   Hearing/Vision screen No exam data present  Dietary issues and exercise activities discussed:   Diet  Breakfast: Lunch:   Dinner:  Goals   None    Depression Screen PHQ 2/9 Scores 01/13/2018 04/12/2017  PHQ - 2 Score 5 4  PHQ- 9 Score 17 12    Fall Risk No flowsheet data found.  Is the patient's home free of loose throw rugs in walkways, pet beds, electrical cords, etc?   {Blank single:19197::"yes","no"}      Grab bars in the bathroom? {Blank single:19197::"yes","no"}      Handrails on the stairs?   {Blank single:19197::"yes","no"}      Adequate lighting?   {Blank single:19197::"yes","no"}  Cognitive Function:        Screening Tests Health Maintenance  Topic Date Due  . PNA vac Low Risk Adult (1 of 2 - PCV13) 12/19/2017  . DEXA SCAN  05/23/2018  . TETANUS/TDAP  02/19/2019 (Originally 08/14/2015)  . INFLUENZA VACCINE  05/23/2019 (Originally 03/13/2018)  . PAP SMEAR-Modifier  07/18/2045 (Originally 11/11/2012)  . MAMMOGRAM  05/23/2019  . COLONOSCOPY  03/21/2027  . Hepatitis C Screening  Completed  . HIV Screening  Completed      Plan:   ***  I have personally reviewed and noted the following in the patient's chart:   . Medical and social history . Use of alcohol, tobacco or illicit drugs  . Current medications and supplements . Functional ability and status . Nutritional status . Physical activity . Advanced directives . List of other physicians . Hospitalizations, surgeries, and ER visits in previous 12 months . Vitals . Screenings to include cognitive, depression, and falls . Referrals and appointments  In addition, I have reviewed and discussed with patient certain preventive protocols, quality metrics, and best practice recommendations. A written personalized care plan for preventive services as well as general preventive health recommendations were provided to patient.     Joanne Chars, LPN   2/63/3354

## 2018-08-28 ENCOUNTER — Encounter: Payer: Self-pay | Admitting: Physician Assistant

## 2018-08-28 NOTE — Telephone Encounter (Signed)
Called patient and spoke with her about the statin. Patient states her cholesterol levels in her last lab work was great, and so she is not interested in starting a statin at this time.   I also gave patient the results from her last appointment with [facility]. Patient states she never received results. Patient was wanting to see if there was any other tets that we could do to make sure she does not have aids. I know there are blood tests that can be performed, and patient is interested in having testing done just to be sure. Just wanted to let you know! Thanks!

## 2018-09-01 NOTE — Telephone Encounter (Signed)
Reassure patient that all STD were negative. HIV was tested and negative. She does not have AIDS or HIV. She has no STI's. This is great news.

## 2018-09-02 ENCOUNTER — Ambulatory Visit: Payer: Medicare Other

## 2018-09-02 NOTE — Telephone Encounter (Signed)
Called and notified patient of results. No further questions or concerns at this time.

## 2018-09-09 ENCOUNTER — Ambulatory Visit: Payer: Medicare Other

## 2018-09-09 NOTE — Progress Notes (Unsigned)
Subjective:   Jamie [name] is a 68 y.o. female who presents for an Initial Medicare Annual Wellness Visit.  Review of Systems    No ROS.  Medicare Wellness Visit. Additional risk factors are reflected in the social history.     Sleep patterns:  Home Safety/Smoke Alarms: Feels safe in home. Smoke alarms in place.  Living environment;  Seat Belt Safety/Bike Helmet: Wears seat belt.   Female:   Pap-       Mammo-       Dexa scan-        CCS-       Objective:    There were no vitals filed for this visit. There is no height or weight on file to calculate BMI.  Advanced Directives 09/12/2017 07/17/2017 06/09/2017 03/05/2017 02/04/2017 12/09/2016 11/13/2016  Does Patient Have a Medical Advance Directive? No No No No No No No  Would patient like information on creating a medical advance directive? - - - - - No - Patient declined No - Patient declined  Pre-existing out of facility DNR order (yellow form or pink MOST form) - - - - - - -    Current Medications (verified) Outpatient Encounter Medications as of 09/09/2018  Medication Sig  . ALPRAZolam (XANAX) 0.5 MG tablet TAKE 1 TABLET(0.5 MG) BY MOUTH DAILY AS NEEDED FOR ANXIETY  . B-D 3CC LUER-LOK SYR 25GX1/2" 25G X 1-1/2" 3 ML MISC USE TO INJECT INTO MUSCLE FOR 1 DOSE  . EPINEPHrine (AUVI-Q) 0.3 mg/0.3 mL IJ SOAJ injection Use as directed for severe allergic reactions (Patient taking differently: Inject 0.3 mg into the muscle as needed (for severe allergic reactions). )  . ergocalciferol (VITAMIN D2) 50000 units capsule Take 1 capsule (50,000 Units total) by mouth once a week.  . ferrous sulfate 325 (65 FE) MG EC tablet Take 1 tablet (325 mg total) by mouth 3 (three) times daily with meals.  . NON FORMULARY Take 1 capsule by mouth 3 (three) times daily. Herbal supplements for gastritis  . omeprazole (PRILOSEC) 40 MG capsule Take 1 capsule (40 mg total) by mouth daily.  . Prasterone (INTRAROSA) 6.5 MG INST Place 1 application vaginally  at bedtime.   No facility-administered encounter medications on file as of 09/09/2018.     Allergies (verified) Iodine; Ciprofloxacin; Iodinated diagnostic agents; Peanut butter flavor; and Shellfish allergy   History: Past Medical History:  Diagnosis Date  . Anxiety   . Depression   . Diverticulitis   . Diverticulosis   . Headache   . Hyperlipidemia   . IBS (irritable bowel syndrome)   . Lung mass   . Syncope 03/08/2015   Past Surgical History:  Procedure Laterality Date  . ABDOMINAL HYSTERECTOMY    . HEMORRHOID SURGERY    . TUBAL LIGATION    . TUMOR REMOVAL     Left thigh    Family History  Problem Relation Age of Onset  . Diabetes Mother   . Liver cancer Mother        renal cell   . Heart disease Father        first MI in 43s  . Cervical cancer Sister   . Allergic rhinitis Sister   . Asthma Sister   . Heart disease Brother        first MI in 18s  . Alcohol abuse Sister   . Fibromyalgia Sister   . Pancreatic cancer Sister 86  . Arthritis Sister        osteoarthritis  .  Emphysema Sister   . Irritable bowel syndrome Sister   . Emphysema Sister   . Leukemia Unknown        grandson  . Urticaria Daughter   . Allergic rhinitis Daughter   . Stomach cancer Neg Hx   . Colon cancer Neg Hx   . Angioedema Neg Hx   . Eczema Neg Hx   . Immunodeficiency Neg Hx    Social History   Socioeconomic History  . Marital status: Divorced    Spouse name: Not on file  . Number of children: 3  . Years of education: Not on file  . Highest education level: Not on file  Occupational History  . Occupation: Ship broker  Social Needs  . Financial resource strain: Not on file  . Food insecurity:    Worry: Not on file    Inability: Not on file  . Transportation needs:    Medical: Not on file    Non-medical: Not on file  Tobacco Use  . Smoking status: Former Smoker    Packs/day: 0.80    Years: 20.00    Pack years: 16.00    Types: Cigarettes    Last attempt to quit: 08/13/1985     Years since quitting: 33.0  . Smokeless tobacco: Never Used  Substance and Sexual Activity  . Alcohol use: Yes    Comment: rare  . Drug use: No  . Sexual activity: Not Currently    Partners: Male    Birth control/protection: None  Lifestyle  . Physical activity:    Days per week: Not on file    Minutes per session: Not on file  . Stress: Not on file  Relationships  . Social connections:    Talks on phone: Not on file    Gets together: Not on file    Attends religious service: Not on file    Active member of club or organization: Not on file    Attends meetings of clubs or organizations: Not on file    Relationship status: Not on file  Other Topics Concern  . Not on file  Social History Narrative   Lives with and grandchild (she is foster parent for grand child) Completed school full time.   Caffeine use; occasional   Works in Personal assistant, Economist.   In process of divorce.         Tobacco Counseling Counseling given: Not Answered   Clinical Intake:                        Activities of Daily Living No flowsheet data found.   Immunizations and Health Maintenance Immunization History  Administered Date(s) Administered  . Td 08/13/2005   Health Maintenance Due  Topic Date Due  . PNA vac Low Risk Adult (1 of 2 - PCV13) 12/19/2017  . DEXA SCAN  05/23/2018    Patient Care Team: Lavada Mesi as PCP - General (Family Medicine)  Indicate any recent Medical Services you may have received from other than [facility] providers in the past year (date may be approximate).     Assessment:   This is a routine wellness examination for [facility].  Hearing/Vision screen No exam data present  Dietary issues and exercise activities discussed:    Goals   None    Depression Screen PHQ 2/9 Scores 01/13/2018 04/12/2017  PHQ - 2 Score 5 4  PHQ- 9 Score 17 12    Fall Risk No flowsheet data  found.  Is the patient's home free of  loose throw rugs in walkways, pet beds, electrical cords, etc?   {Blank single:19197::"yes","no"}      Grab bars in the bathroom? {Blank single:19197::"yes","no"}      Handrails on the stairs?   {Blank single:19197::"yes","no"}      Adequate lighting?   {Blank single:19197::"yes","no"}  Timed Get Up and Go Performed ***  Cognitive Function:        Screening Tests Health Maintenance  Topic Date Due  . PNA vac Low Risk Adult (1 of 2 - PCV13) 12/19/2017  . DEXA SCAN  05/23/2018  . TETANUS/TDAP  02/19/2019 (Originally 08/14/2015)  . INFLUENZA VACCINE  05/23/2019 (Originally 03/13/2018)  . PAP SMEAR-Modifier  07/18/2045 (Originally 11/11/2012)  . MAMMOGRAM  05/23/2019  . COLONOSCOPY  03/21/2027  . Hepatitis C Screening  Completed  . HIV Screening  Completed    Qualifies for Shingles Vaccine? ***  Cancer Screenings: Lung: Low Dose CT Chest recommended if Age 24-80 years, 30 pack-year currently smoking OR have quit w/in 15years. Patient {DOES NOT does:27190::"does not"} qualify. Breast: Up to date on Mammogram? {Yes/No:30480221}   Up to date of Bone Density/Dexa? {Yes/No:30480221} Colorectal: ***  Additional Screenings: *** Hepatitis C Screening:      Plan:   ***  I have personally reviewed and noted the following in the patient's chart:   . Medical and social history . Use of alcohol, tobacco or illicit drugs  . Current medications and supplements . Functional ability and status . Nutritional status . Physical activity . Advanced directives . List of other physicians . Hospitalizations, surgeries, and ER visits in previous 12 months . Vitals . Screenings to include cognitive, depression, and falls . Referrals and appointments  In addition, I have reviewed and discussed with patient certain preventive protocols, quality metrics, and best practice recommendations. A written personalized care plan for preventive services as well as general preventive health recommendations  were provided to patient.     Joanne Chars, LPN   08/14/5850

## 2018-09-10 ENCOUNTER — Ambulatory Visit (INDEPENDENT_AMBULATORY_CARE_PROVIDER_SITE_OTHER): Payer: Medicare Other

## 2018-09-10 ENCOUNTER — Ambulatory Visit (INDEPENDENT_AMBULATORY_CARE_PROVIDER_SITE_OTHER): Payer: Medicare Other | Admitting: Physician Assistant

## 2018-09-10 ENCOUNTER — Encounter: Payer: Self-pay | Admitting: Physician Assistant

## 2018-09-10 VITALS — BP 123/58 | HR 95 | Ht 64.0 in | Wt 145.0 lb

## 2018-09-10 DIAGNOSIS — M8589 Other specified disorders of bone density and structure, multiple sites: Secondary | ICD-10-CM

## 2018-09-10 DIAGNOSIS — R350 Frequency of micturition: Secondary | ICD-10-CM

## 2018-09-10 DIAGNOSIS — F419 Anxiety disorder, unspecified: Secondary | ICD-10-CM | POA: Diagnosis not present

## 2018-09-10 DIAGNOSIS — Z1231 Encounter for screening mammogram for malignant neoplasm of breast: Secondary | ICD-10-CM | POA: Diagnosis not present

## 2018-09-10 DIAGNOSIS — L290 Pruritus ani: Secondary | ICD-10-CM | POA: Diagnosis not present

## 2018-09-10 DIAGNOSIS — N898 Other specified noninflammatory disorders of vagina: Secondary | ICD-10-CM | POA: Diagnosis not present

## 2018-09-10 LAB — POCT URINALYSIS DIPSTICK
Bilirubin, UA: NEGATIVE
Blood, UA: NEGATIVE
Glucose, UA: NEGATIVE
Ketones, UA: NEGATIVE
Nitrite, UA: NEGATIVE
PH UA: 6 (ref 5.0–8.0)
Protein, UA: NEGATIVE
Spec Grav, UA: 1.015 (ref 1.010–1.025)
Urobilinogen, UA: 0.2 E.U./dL

## 2018-09-10 MED ORDER — ESTROGENS, CONJUGATED 0.625 MG/GM VA CREA: TOPICAL_CREAM | VAGINAL | 2 refills | Status: DC

## 2018-09-10 MED ORDER — ALPRAZOLAM 0.5 MG PO TABS: ORAL_TABLET | ORAL | 0 refills | Status: DC

## 2018-09-10 NOTE — Progress Notes (Signed)
Remains osteopenic. Continue vitamin D and calcium. Recheck in 2 years.

## 2018-09-10 NOTE — Progress Notes (Signed)
Call pt: normal mammogram follow up in 1 year.
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
## 2018-09-11 LAB — URINE CULTURE
MICRO NUMBER:: 122491
Result:: NO GROWTH
SPECIMEN QUALITY:: ADEQUATE

## 2018-09-12 NOTE — Progress Notes (Signed)
Why can I not get a STAT wet prep? I really don't understand.  Jamie [name] assures me she is doing everything right. Is this a lab issue? Are other providers having the same problem?   -patients culture showed no growth but I would like to have prep results as well.

## 2018-09-12 NOTE — Progress Notes (Signed)
Please call explain what happened. She if can come in to get another swab.

## 2018-09-15 ENCOUNTER — Encounter: Payer: Self-pay | Admitting: Physician Assistant

## 2018-09-15 DIAGNOSIS — L290 Pruritus ani: Secondary | ICD-10-CM

## 2018-09-15 DIAGNOSIS — N898 Other specified noninflammatory disorders of vagina: Secondary | ICD-10-CM

## 2018-09-15 HISTORY — DX: Pruritus ani: L29.0

## 2018-09-15 HISTORY — DX: Other specified noninflammatory disorders of vagina: N89.8

## 2018-09-18 LAB — WET PREP, GENITAL

## 2018-09-18 LAB — SPECIMEN STATUS REPORT

## 2018-09-19 ENCOUNTER — Ambulatory Visit: Payer: Medicare Other | Admitting: Physician Assistant

## 2018-09-30 ENCOUNTER — Ambulatory Visit (INDEPENDENT_AMBULATORY_CARE_PROVIDER_SITE_OTHER): Payer: Medicare Other

## 2018-09-30 ENCOUNTER — Ambulatory Visit (INDEPENDENT_AMBULATORY_CARE_PROVIDER_SITE_OTHER): Payer: Medicare Other | Admitting: Physician Assistant

## 2018-09-30 ENCOUNTER — Encounter: Payer: Self-pay | Admitting: Physician Assistant

## 2018-09-30 ENCOUNTER — Other Ambulatory Visit: Payer: Self-pay | Admitting: Physician Assistant

## 2018-09-30 VITALS — BP 124/60 | HR 88 | Ht 64.0 in | Wt 146.0 lb

## 2018-09-30 DIAGNOSIS — M542 Cervicalgia: Secondary | ICD-10-CM

## 2018-09-30 DIAGNOSIS — R05 Cough: Secondary | ICD-10-CM | POA: Diagnosis not present

## 2018-09-30 DIAGNOSIS — M5030 Other cervical disc degeneration, unspecified cervical region: Secondary | ICD-10-CM | POA: Diagnosis not present

## 2018-09-30 DIAGNOSIS — R059 Cough, unspecified: Secondary | ICD-10-CM

## 2018-09-30 DIAGNOSIS — M5412 Radiculopathy, cervical region: Secondary | ICD-10-CM

## 2018-09-30 MED ORDER — MELOXICAM 7.5 MG PO TABS: 7.5000 mg | ORAL_TABLET | Freq: Every day | ORAL | 0 refills | Status: DC

## 2018-09-30 MED ORDER — CYCLOBENZAPRINE HCL 5 MG PO TABS: 5.0000 mg | ORAL_TABLET | Freq: Three times a day (TID) | ORAL | 1 refills | Status: DC | PRN

## 2018-09-30 MED ORDER — PREDNISONE 50 MG PO TABS: ORAL_TABLET | ORAL | 0 refills | Status: DC

## 2018-09-30 NOTE — Patient Instructions (Signed)
Once you finish prednisone for next week do one tablet of mobic daily with food.

## 2018-09-30 NOTE — Progress Notes (Signed)
Arthritis and degenerative changes at the spine. Disc height loss at C5 and C6 likely where disc involvement is and causing symptoms. Treatment plan stays the same.

## 2018-09-30 NOTE — Progress Notes (Signed)
f °

## 2018-09-30 NOTE — Progress Notes (Signed)
Call pt: normal CXR.
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
## 2018-10-01 ENCOUNTER — Encounter: Payer: Self-pay | Admitting: Physician Assistant

## 2018-10-01 DIAGNOSIS — M5412 Radiculopathy, cervical region: Secondary | ICD-10-CM | POA: Insufficient documentation

## 2018-10-01 HISTORY — DX: Radiculopathy, cervical region: M54.12

## 2018-10-26 IMAGING — US US TRANSVAGINAL NON-OB
1 series · 14 of 25 positions shown · non-contrast
Comparison: 01/29/2014

CLINICAL DATA: Pelvic pain in female for 2 months. Previous
hysterectomy and left oophorectomy.

EXAM:
TRANSABDOMINAL AND TRANSVAGINAL ULTRASOUND OF PELVIS
TECHNIQUE: Both transabdominal and transvaginal ultrasound examinations of the
pelvis were performed. Transabdominal technique was performed for
global imaging of the pelvis including uterus, ovaries, adnexal
regions, and pelvic cul-de-sac. It was necessary to proceed with
endovaginal exam following the transabdominal exam to visualize the
ovary and adnexa.

[Series 1: us transvaginal non-ob · 0.20mm/px · 14 of 36 slices shown]
[im 1/36]
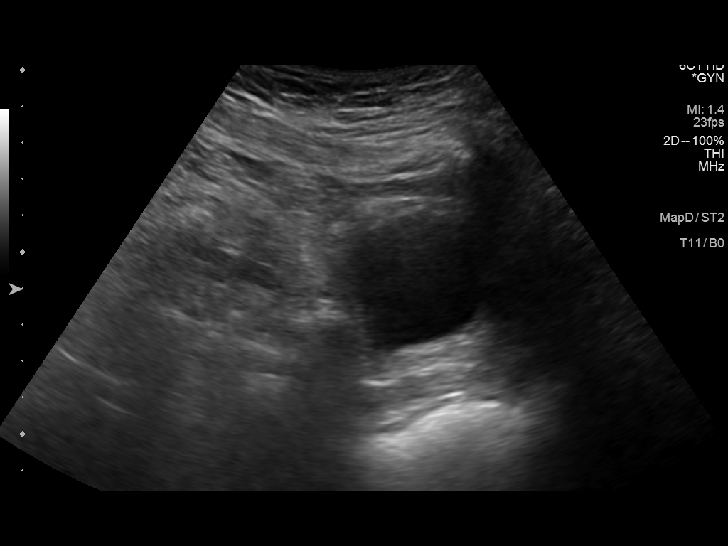
[im 3/36]
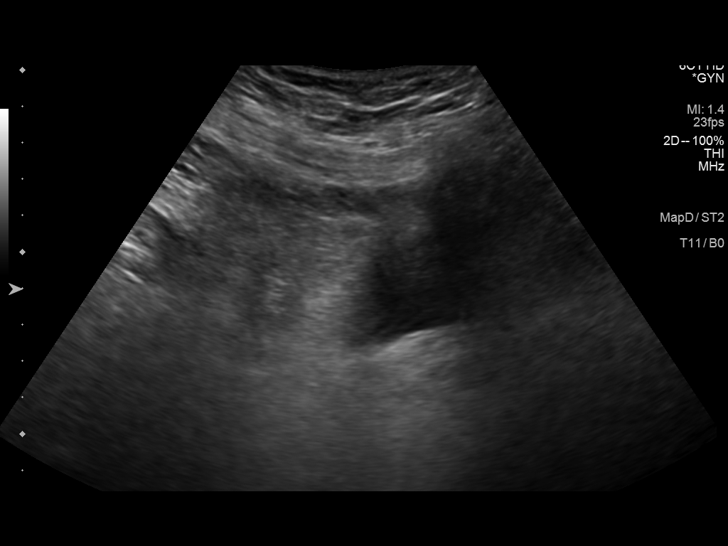
[im 6/36]
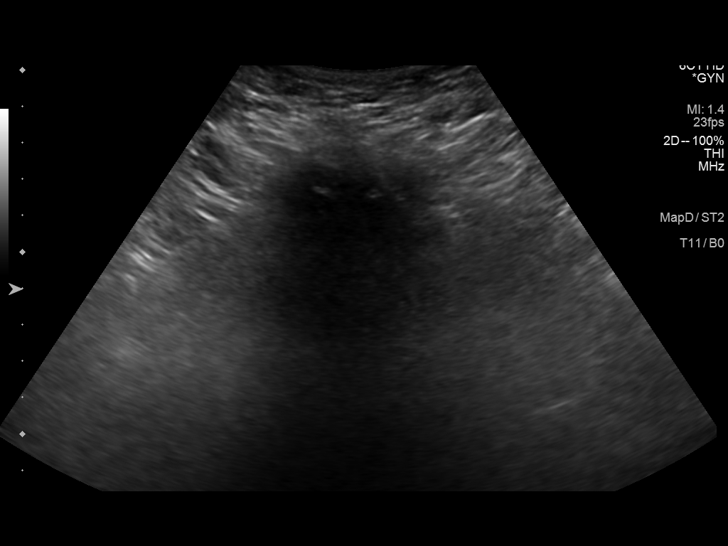
[im 9/36]
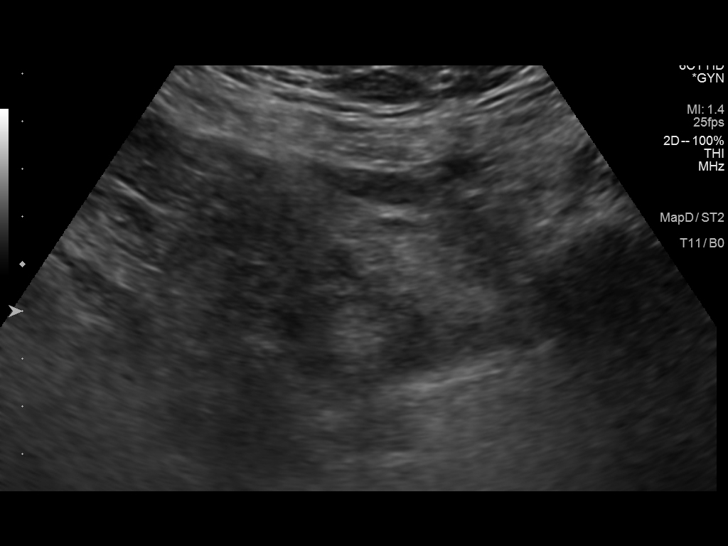
[im 12/36]
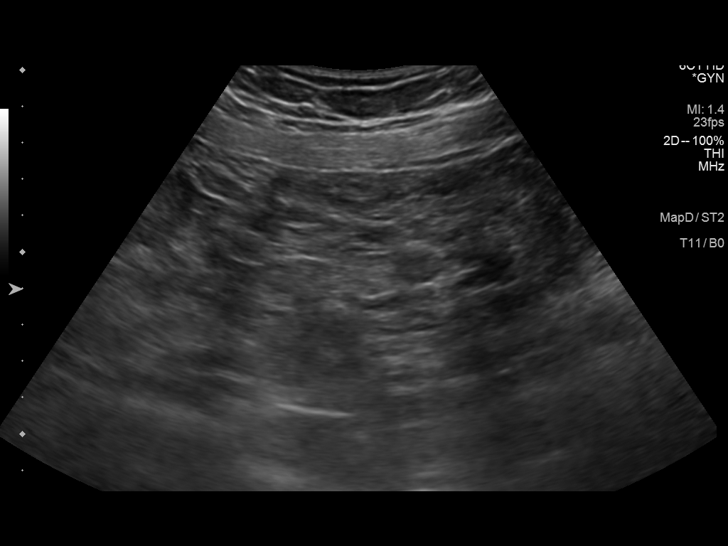
[im 14/36]
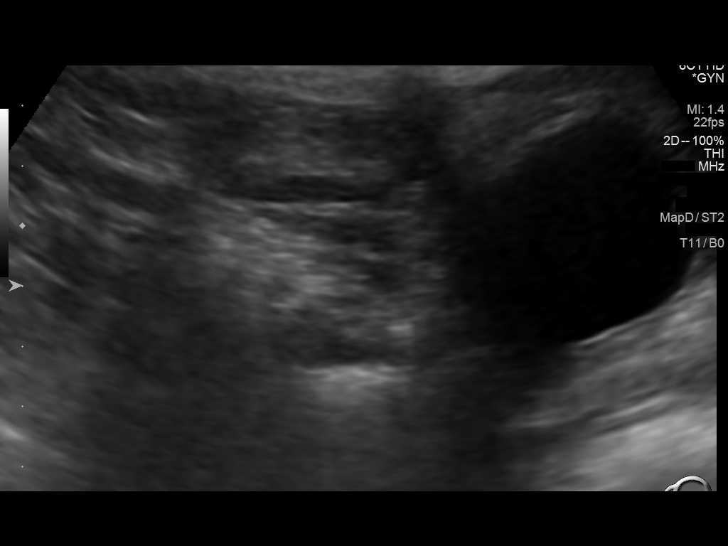
[im 17/36]
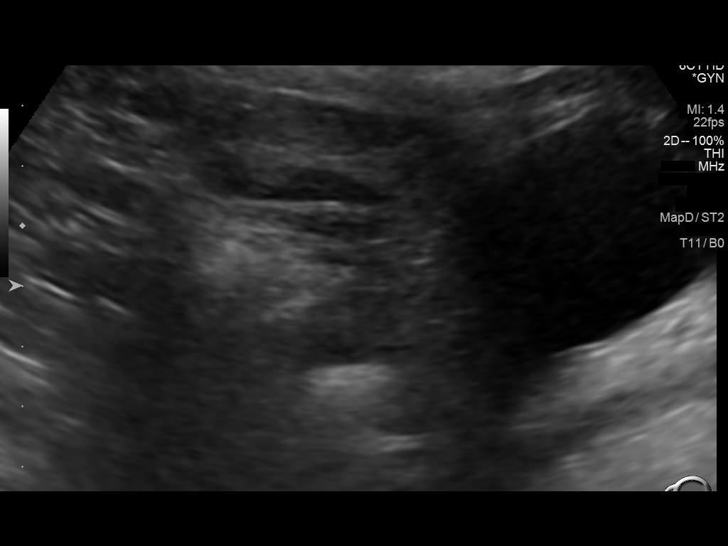
[im 19/36]
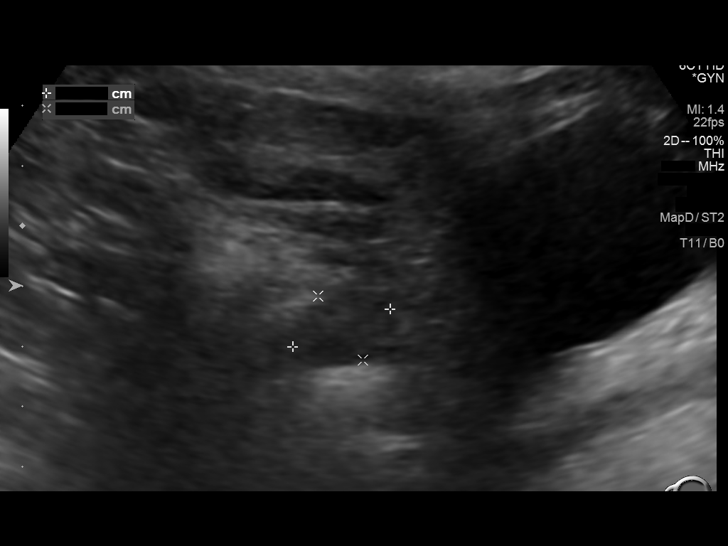
[im 22/36]
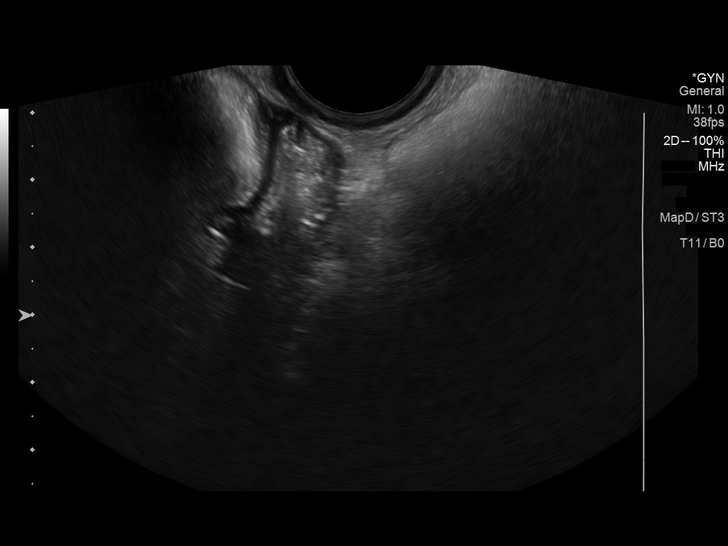
[im 24/36]
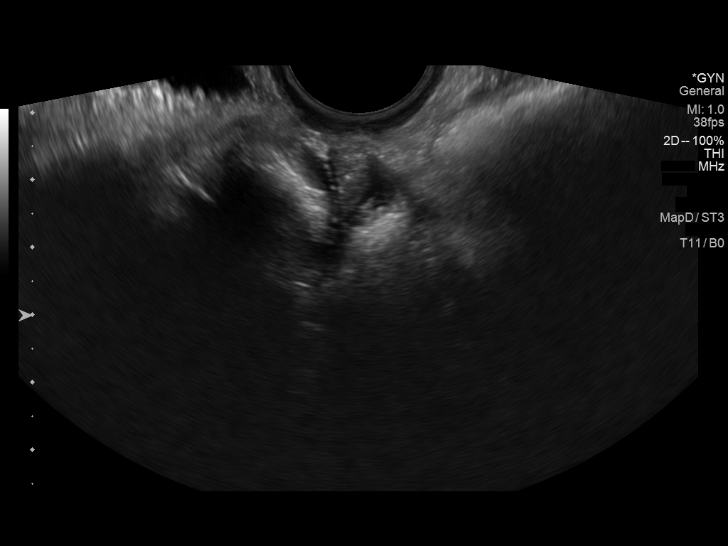
[im 27/36]
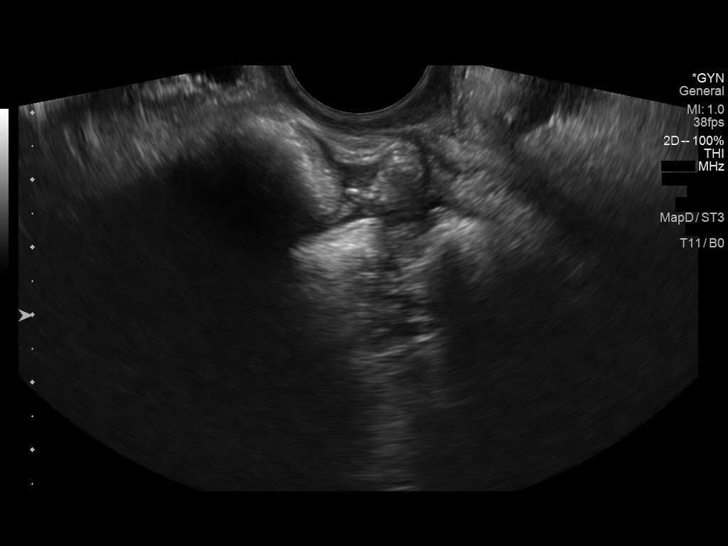
[im 30/36]
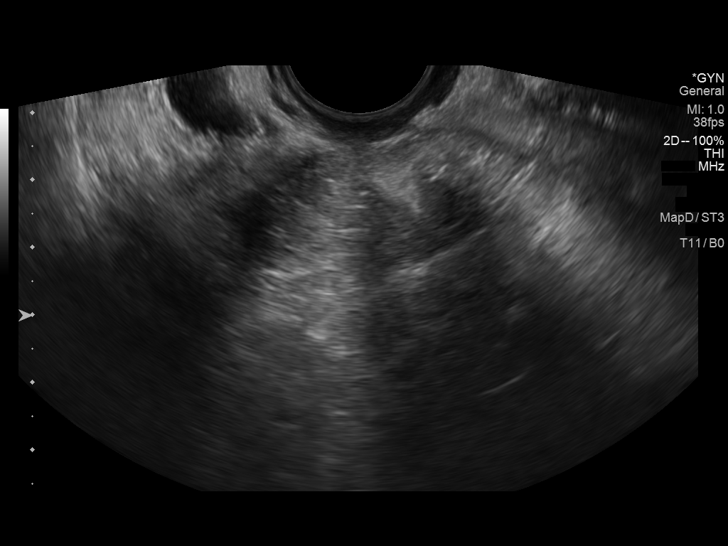
[im 33/36]
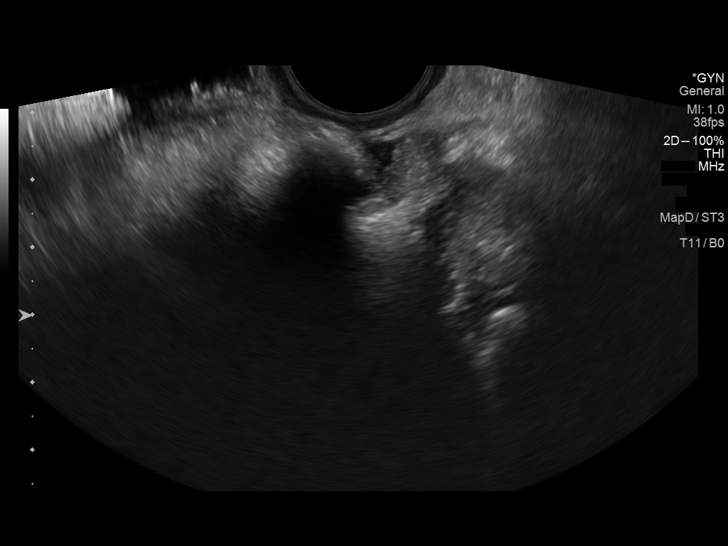
[im 36/36]
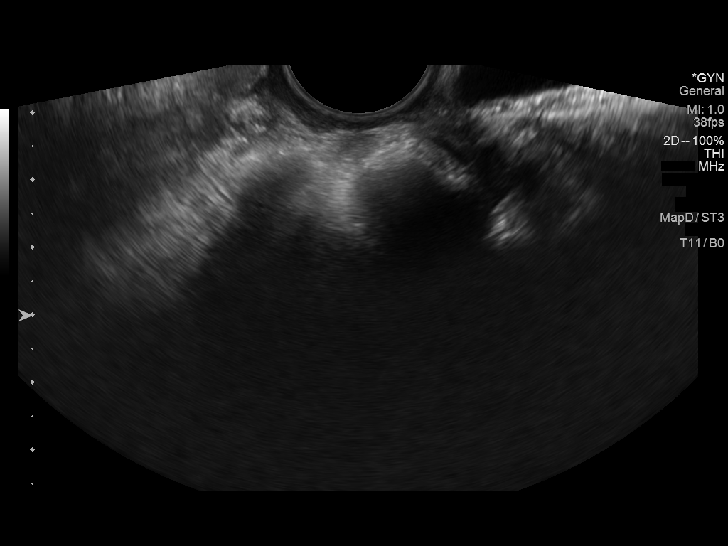

[14 of 25 positions shown; findings below may reference images not displayed]

FINDINGS: Uterus

Measurements: Surgically absent.

Endometrium

Thickness: Surgically absent.

Right ovary

Measurements: 1.7 x 1.3 x 1.6 cm. Normal appearance/no adnexal mass.

Left ovary

Measurements: Surgically absent. No adnexal mass identified.

Other findings

No abnormal free fluid.
IMPRESSION: Normal appearance of right ovary. No pelvic mass or other
significant abnormality identified.

Prior hysterectomy and left oophorectomy.

## 2018-10-28 ENCOUNTER — Other Ambulatory Visit: Payer: Self-pay | Admitting: Physician Assistant

## 2018-10-28 DIAGNOSIS — M542 Cervicalgia: Secondary | ICD-10-CM

## 2018-10-28 DIAGNOSIS — M5412 Radiculopathy, cervical region: Secondary | ICD-10-CM

## 2018-11-03 ENCOUNTER — Ambulatory Visit (INDEPENDENT_AMBULATORY_CARE_PROVIDER_SITE_OTHER): Payer: Medicare Other | Admitting: Physician Assistant

## 2018-11-03 ENCOUNTER — Other Ambulatory Visit: Payer: Self-pay

## 2018-11-03 ENCOUNTER — Telehealth: Payer: Self-pay | Admitting: Physician Assistant

## 2018-11-03 ENCOUNTER — Encounter: Payer: Self-pay | Admitting: Physician Assistant

## 2018-11-03 VITALS — BP 120/70 | HR 82 | Temp 98.1°F | Wt 148.0 lb

## 2018-11-03 DIAGNOSIS — Z8719 Personal history of other diseases of the digestive system: Secondary | ICD-10-CM

## 2018-11-03 DIAGNOSIS — K5792 Diverticulitis of intestine, part unspecified, without perforation or abscess without bleeding: Secondary | ICD-10-CM | POA: Diagnosis not present

## 2018-11-03 DIAGNOSIS — R1032 Left lower quadrant pain: Secondary | ICD-10-CM

## 2018-11-03 MED ORDER — AMOXICILLIN-POT CLAVULANATE 875-125 MG PO TABS: 1.0000 | ORAL_TABLET | Freq: Two times a day (BID) | ORAL | 0 refills | Status: DC

## 2018-11-03 NOTE — Progress Notes (Signed)
.  Virtual Visit via Telephone Note  I connected with Jamie [name] on 11/04/18 at  3:40 PM EDT by telephone and verified that I am speaking with the correct person using two identifiers.   I discussed the limitations, risks, security and privacy concerns of performing an evaluation and management service by telephone and the availability of in person appointments. I also discussed with the patient that there may be a patient responsible charge related to this service. The patient expressed understanding and agreed to proceed.  All abdominal pain. Started 2 days ago. Hurting all the way across stomach. Diarrhea eating nuts and corn.  No urianry symptoms.     History of Present Illness: Pt is a 68 yo female with a history of diverticulitis who calls into clinic today with left lower quadrant pain for last 2 days. She admits to eating a lot of nuts and corn for the last few weeks. She feels like that could have caused the flare. She denies any fever, chills, body aches. She is having diarrhea but denies any hematochezia or melena. Pt denies any urinary frequency or dysuria. Not taken anything to make better.   Last colonoscopy showed:  IMPRESSION: 1. Sigmoid diverticulitis complicated by microperforation and small abscess. 2. Mild left ureteral dilatation is seen secondarily.   Observations/Objective: No acute distress  .Marland Kitchen Today's Vitals   11/03/18 1542  BP: 120/70  Pulse: 82  Temp: 98.1 F (36.7 C)  Weight: 148 lb (67.1 kg)   Body mass index is 25.4 kg/m.   Assessment and Plan: Marland KitchenMarland KitchenDiagnoses and all orders for this visit:  Left lower quadrant abdominal pain -     amoxicillin-clavulanate (AUGMENTIN) 875-125 MG tablet; Take 1 tablet by mouth 2 (two) times daily.  History of diverticulitis   Due to hx and present symptoms and reason for flare suspect diverticulitis and will treat empirically with augmentin. Pt had intolerance to cipro. Discussed diverticular flares and  appropriate diet. I sent HO via mychart. If spikes fever or symptoms worsen she does need to be seen in office. Reassuring no fever.   Follow Up Instructions:    I discussed the assessment and treatment plan with the patient. The patient was provided an opportunity to ask questions and all were answered. The patient agreed with the plan and demonstrated an understanding of the instructions.   The patient was advised to call back or seek an in-person evaluation if the symptoms worsen or if the condition fails to improve as anticipated.  I provided 15 minutes of non-face-to-face time during this encounter.   [name], PA-C

## 2018-11-03 NOTE — Telephone Encounter (Signed)
mychart msg for diverticulitis eating plan.

## 2018-11-03 NOTE — Patient Instructions (Addendum)

## 2018-11-17 ENCOUNTER — Other Ambulatory Visit: Payer: Self-pay | Admitting: Physician Assistant

## 2018-11-17 DIAGNOSIS — F419 Anxiety disorder, unspecified: Secondary | ICD-10-CM

## 2018-11-19 ENCOUNTER — Other Ambulatory Visit: Payer: Self-pay

## 2018-11-19 ENCOUNTER — Telehealth (INDEPENDENT_AMBULATORY_CARE_PROVIDER_SITE_OTHER): Payer: Medicare Other | Admitting: Physician Assistant

## 2018-11-19 VITALS — BP 170/67 | HR 82 | Wt 149.0 lb

## 2018-11-19 DIAGNOSIS — F419 Anxiety disorder, unspecified: Secondary | ICD-10-CM | POA: Diagnosis not present

## 2018-11-19 DIAGNOSIS — K2100 Gastro-esophageal reflux disease with esophagitis, without bleeding: Secondary | ICD-10-CM

## 2018-11-19 DIAGNOSIS — B353 Tinea pedis: Secondary | ICD-10-CM | POA: Diagnosis not present

## 2018-11-19 DIAGNOSIS — R030 Elevated blood-pressure reading, without diagnosis of hypertension: Secondary | ICD-10-CM

## 2018-11-19 DIAGNOSIS — J3489 Other specified disorders of nose and nasal sinuses: Secondary | ICD-10-CM

## 2018-11-19 DIAGNOSIS — F430 Acute stress reaction: Secondary | ICD-10-CM

## 2018-11-19 MED ORDER — ONDANSETRON HCL 8 MG PO TABS: 8.0000 mg | ORAL_TABLET | Freq: Three times a day (TID) | ORAL | 0 refills | Status: DC | PRN

## 2018-11-19 MED ORDER — ALPRAZOLAM 0.5 MG PO TABS: ORAL_TABLET | ORAL | 2 refills | Status: DC

## 2018-11-19 MED ORDER — CLOTRIMAZOLE 1 % EX CREA: 1.0000 "application " | TOPICAL_CREAM | Freq: Two times a day (BID) | CUTANEOUS | 0 refills | Status: DC

## 2018-11-19 MED ORDER — OMEPRAZOLE 40 MG PO CPDR: 40.0000 mg | DELAYED_RELEASE_CAPSULE | Freq: Every day | ORAL | 4 refills | Status: DC

## 2018-11-19 NOTE — Progress Notes (Deleted)
-  Did not complete Augmentin, feels SX are returning right now (has 10 tablets left)  -taking 2 Xanax daily, increased anxiety  -having a lot of headaches, sometimes burning in chest, bottoms of feet are peeling (?)  -requesting RX for nausea  -please see PHQ/GAD
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
## 2018-11-24 ENCOUNTER — Encounter: Payer: Self-pay | Admitting: Physician Assistant

## 2018-11-24 NOTE — Progress Notes (Signed)
Called pt, no answer and VM was full

## 2018-11-25 NOTE — Progress Notes (Signed)
Having good days and bad days, but anxiety is improving. URI SX doing better. States abdominal pain (diverticulitis) is still pretty rough, feels her medication may be constipating her. Drinking about 3-5 bottles a day. Only having a BM every other day. Wanting to know what she could have something to help bowels, she took Linzess in the past. Only has about 3 Augmentin left, feels like she will need a refill. Having a lot of pain still in stomach all the way around to back. Describes as stinging and burning. States she is willing to try "the other medication that made me sick" if it will make this go away.

## 2018-11-26 MED ORDER — LINACLOTIDE 290 MCG PO CAPS: 290.0000 ug | ORAL_CAPSULE | Freq: Every day | ORAL | 3 refills | Status: DC

## 2018-11-26 NOTE — Addendum Note (Signed)
Addended by: [name] on: 11/26/2018 12:43 PM   Modules accepted: Orders

## 2018-11-26 NOTE — Progress Notes (Signed)
Pt advised. Will start medication tomorrow and call next week with update

## 2018-11-26 NOTE — Progress Notes (Signed)
Patient ID: Jamie [name], female   DOB: June 17, 1951, 68 y.o.   MRN: [id number]  I can send linzess again to try and could be the cause of pain if constipated.

## 2018-11-26 NOTE — Progress Notes (Signed)
Sent linzess for constipation. Daily before breakfast.

## 2018-12-04 ENCOUNTER — Encounter: Payer: Self-pay | Admitting: Physician Assistant

## 2018-12-09 ENCOUNTER — Ambulatory Visit: Payer: Medicare Other | Admitting: Psychology

## 2018-12-19 ENCOUNTER — Telehealth (INDEPENDENT_AMBULATORY_CARE_PROVIDER_SITE_OTHER): Payer: Medicare Other | Admitting: Physician Assistant

## 2018-12-19 DIAGNOSIS — Z91199 Patient's noncompliance with other medical treatment and regimen due to unspecified reason: Secondary | ICD-10-CM

## 2018-12-19 DIAGNOSIS — Z5329 Procedure and treatment not carried out because of patient's decision for other reasons: Secondary | ICD-10-CM

## 2018-12-19 NOTE — Progress Notes (Signed)
No show

## 2018-12-23 ENCOUNTER — Telehealth (INDEPENDENT_AMBULATORY_CARE_PROVIDER_SITE_OTHER): Payer: Medicare Other | Admitting: Physician Assistant

## 2018-12-23 VITALS — Ht 64.0 in | Wt 145.0 lb

## 2018-12-23 DIAGNOSIS — R0602 Shortness of breath: Secondary | ICD-10-CM

## 2018-12-23 DIAGNOSIS — R079 Chest pain, unspecified: Secondary | ICD-10-CM | POA: Diagnosis not present

## 2018-12-23 DIAGNOSIS — R05 Cough: Secondary | ICD-10-CM

## 2018-12-23 DIAGNOSIS — R110 Nausea: Secondary | ICD-10-CM | POA: Diagnosis not present

## 2018-12-23 DIAGNOSIS — R059 Cough, unspecified: Secondary | ICD-10-CM

## 2018-12-23 MED ORDER — ALBUTEROL SULFATE HFA 108 (90 BASE) MCG/ACT IN AERS: 2.0000 | INHALATION_SPRAY | Freq: Four times a day (QID) | RESPIRATORY_TRACT | 0 refills | Status: DC | PRN

## 2018-12-23 MED ORDER — ONDANSETRON HCL 8 MG PO TABS: 8.0000 mg | ORAL_TABLET | Freq: Three times a day (TID) | ORAL | 0 refills | Status: DC | PRN

## 2018-12-23 MED ORDER — GABAPENTIN 300 MG PO CAPS: 300.0000 mg | ORAL_CAPSULE | Freq: Two times a day (BID) | ORAL | 1 refills | Status: DC

## 2018-12-23 MED ORDER — BENZONATATE 200 MG PO CAPS: 200.0000 mg | ORAL_CAPSULE | Freq: Two times a day (BID) | ORAL | 0 refills | Status: DC | PRN
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
## 2018-12-23 NOTE — Progress Notes (Deleted)
Chest/back hurting - 2 days, dry cough started 5 days ago that comes and goes. She states sometimes she will  Headache - back head started 5 days ago. No sinus congestion. No fevers. No other symptoms.

## 2018-12-24 NOTE — Telephone Encounter (Signed)
Called to check on patient to see how she is doing. She is doing about the same. She has not picked up medications yet. She wants to have Covid antibody testing done. Please advise.

## 2018-12-24 NOTE — Telephone Encounter (Signed)
Will you call and check up on patient. She how symptoms are doing this morning.

## 2018-12-24 NOTE — Telephone Encounter (Signed)
Covid antibodies need a few weeks AFTER the illness clears to be detected. The body has to encounter the virus and then form the antibodies and then we can test for them in blood about 3 weeks later. Does this make sense?

## 2018-12-29 ENCOUNTER — Ambulatory Visit: Payer: Medicare Other | Admitting: Physician Assistant

## 2018-12-29 NOTE — Telephone Encounter (Signed)
Pt missed office visit today. Can we find out how she is doing?

## 2018-12-30 ENCOUNTER — Encounter: Payer: Self-pay | Admitting: Physician Assistant

## 2018-12-30 ENCOUNTER — Telehealth (INDEPENDENT_AMBULATORY_CARE_PROVIDER_SITE_OTHER): Payer: Medicare Other | Admitting: Physician Assistant

## 2018-12-30 VITALS — BP 122/69 | HR 97 | Ht 64.0 in | Wt 153.0 lb

## 2018-12-30 DIAGNOSIS — R202 Paresthesia of skin: Secondary | ICD-10-CM

## 2018-12-30 DIAGNOSIS — R200 Anesthesia of skin: Secondary | ICD-10-CM

## 2018-12-30 DIAGNOSIS — R51 Headache: Secondary | ICD-10-CM | POA: Diagnosis not present

## 2018-12-30 MED ORDER — BUTALBITAL-APAP-CAFFEINE 50-325-40 MG PO TABS: 1.0000 | ORAL_TABLET | Freq: Four times a day (QID) | ORAL | 0 refills | Status: DC | PRN

## 2018-12-30 MED ORDER — CYCLOBENZAPRINE HCL 5 MG PO TABS: 5.0000 mg | ORAL_TABLET | Freq: Three times a day (TID) | ORAL | 1 refills | Status: DC | PRN
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
## 2018-12-30 NOTE — Progress Notes (Deleted)
Patient reports SOB gone. Headaches on right side worse when upset or when lifting things.

## 2018-12-30 NOTE — Telephone Encounter (Signed)
Still having headaches. Virtual appt made for today.

## 2018-12-31 ENCOUNTER — Ambulatory Visit (INDEPENDENT_AMBULATORY_CARE_PROVIDER_SITE_OTHER): Payer: Medicare Other

## 2018-12-31 ENCOUNTER — Telehealth: Payer: Self-pay | Admitting: Physician Assistant

## 2018-12-31 ENCOUNTER — Other Ambulatory Visit: Payer: Self-pay

## 2018-12-31 ENCOUNTER — Encounter: Payer: Self-pay | Admitting: Physician Assistant

## 2018-12-31 ENCOUNTER — Ambulatory Visit (INDEPENDENT_AMBULATORY_CARE_PROVIDER_SITE_OTHER): Payer: Medicare Other | Admitting: Physician Assistant

## 2018-12-31 VITALS — BP 130/57 | HR 114 | Temp 98.5°F

## 2018-12-31 DIAGNOSIS — Z113 Encounter for screening for infections with a predominantly sexual mode of transmission: Secondary | ICD-10-CM

## 2018-12-31 DIAGNOSIS — R200 Anesthesia of skin: Secondary | ICD-10-CM

## 2018-12-31 DIAGNOSIS — R300 Dysuria: Secondary | ICD-10-CM

## 2018-12-31 DIAGNOSIS — R000 Tachycardia, unspecified: Secondary | ICD-10-CM | POA: Diagnosis not present

## 2018-12-31 DIAGNOSIS — J0120 Acute ethmoidal sinusitis, unspecified: Secondary | ICD-10-CM | POA: Diagnosis not present

## 2018-12-31 DIAGNOSIS — R519 Headache, unspecified: Secondary | ICD-10-CM | POA: Insufficient documentation

## 2018-12-31 DIAGNOSIS — R079 Chest pain, unspecified: Secondary | ICD-10-CM

## 2018-12-31 DIAGNOSIS — R29818 Other symptoms and signs involving the nervous system: Secondary | ICD-10-CM | POA: Diagnosis not present

## 2018-12-31 DIAGNOSIS — F419 Anxiety disorder, unspecified: Secondary | ICD-10-CM

## 2018-12-31 HISTORY — DX: Anesthesia of skin: R20.0

## 2018-12-31 LAB — POCT URINALYSIS DIPSTICK
Bilirubin, UA: NEGATIVE
Blood, UA: NEGATIVE
Glucose, UA: NEGATIVE
Ketones, UA: NEGATIVE
Nitrite, UA: NEGATIVE
Protein, UA: NEGATIVE
Spec Grav, UA: <=1.005 — AB (ref 1.010–1.025)
Urobilinogen, UA: 0.2 E.U./dL
pH, UA: 6 (ref 5.0–8.0)

## 2018-12-31 MED ORDER — AMOXICILLIN-POT CLAVULANATE 875-125 MG PO TABS: 1.0000 | ORAL_TABLET | Freq: Two times a day (BID) | ORAL | 0 refills | Status: DC
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
## 2018-12-31 NOTE — Telephone Encounter (Signed)
Call patient:  Ct shows no masses, fluid collections, no blood clots, no stroke.  You have sinus infection of the ethmoid sinuses. Will send over Augmentin for 10 days.

## 2018-12-31 NOTE — Telephone Encounter (Signed)
Pt called triage line stating her numbness and headache has not gotten better. Also reports her pulse is staying around 112-117. Questions next steps. Routing to PCP.

## 2018-12-31 NOTE — Telephone Encounter (Signed)
Stat CT order to get done now. I think pulse is due to her anxiety. I would take a xanax and likely pulse will decrease.

## 2018-12-31 NOTE — Telephone Encounter (Signed)
Pt advised and scheduled for CT today

## 2018-12-31 NOTE — Addendum Note (Signed)
Addended by: [name] on: 12/31/2018 01:01 PM   Modules accepted: Orders

## 2018-12-31 NOTE — Patient Instructions (Addendum)
Sinusitis, Adult Sinusitis is soreness and swelling (inflammation) of your sinuses. Sinuses are hollow spaces in the bones around your face. They are located:  Around your eyes.  In the middle of your forehead.  Behind your nose.  In your cheekbones. Your sinuses and nasal passages are lined with a fluid called mucus. Mucus drains out of your sinuses. Swelling can trap mucus in your sinuses. This lets germs (bacteria, virus, or fungus) grow, which leads to infection. Most of the time, this condition is caused by a virus. What are the causes? This condition is caused by:  Allergies.  Asthma.  Germs.  Things that block your nose or sinuses.  Growths in the nose (nasal polyps).  Chemicals or irritants in the air.  Fungus (rare). What increases the risk? You are more likely to develop this condition if:  You have a weak body defense system (immune system).  You do a lot of swimming or diving.  You use nasal sprays too much.  You smoke. What are the signs or symptoms? The main symptoms of this condition are pain and a feeling of pressure around the sinuses. Other symptoms include:  Stuffy nose (congestion).  Runny nose (drainage).  Swelling and warmth in the sinuses.  Headache.  Toothache.  A cough that may get worse at night.  Mucus that collects in the throat or the back of the nose (postnasal drip).  Being unable to smell and taste.  Being very tired (fatigue).  A fever.  Sore throat.  Bad breath. How is this diagnosed? This condition is diagnosed based on:  Your symptoms.  Your medical history.  A physical exam.  Tests to find out if your condition is short-term (acute) or long-term (chronic). Your doctor may: ? Check your nose for growths (polyps). ? Check your sinuses using a tool that has a light (endoscope). ? Check for allergies or germs. ? Do imaging tests, such as an MRI or CT scan. How is this treated? Treatment for this  condition depends on the cause and whether it is short-term or long-term.  If caused by a virus, your symptoms should go away on their own within 10 days. You may be given medicines to relieve symptoms. They include: ? Medicines that shrink swollen tissue in the nose. ? Medicines that treat allergies (antihistamines). ? A spray that treats swelling of the nostrils. ? Rinses that help get rid of thick mucus in your nose (nasal saline washes).  If caused by bacteria, your doctor may wait to see if you will get better without treatment. You may be given antibiotic medicine if you have: ? A very bad infection. ? A weak body defense system.  If caused by growths in the nose, you may need to have surgery. Follow these instructions at home: Medicines  Take, use, or apply over-the-counter and prescription medicines only as told by your doctor. These may include nasal sprays.  If you were prescribed an antibiotic medicine, take it as told by your doctor. Do not stop taking the antibiotic even if you start to feel better. Hydrate and humidify   Drink enough water to keep your pee (urine) pale yellow.  Use a cool mist humidifier to keep the humidity level in your home above 50%.  Breathe in steam for 10-15 minutes, 3-4 times a day, or as told by your doctor. You can do this in the bathroom while a hot shower is running.  Try not to spend time in cool or  dry air. Rest  Rest as much as you can.  Sleep with your head raised (elevated).  Make sure you get enough sleep each night. General instructions   Put a warm, moist washcloth on your face 3-4 times a day, or as often as told by your doctor. This will help with discomfort.  Wash your hands often with soap and water. If there is no soap and water, use hand sanitizer.  Do not smoke. Avoid being around people who are smoking (secondhand smoke).  Keep all follow-up visits as told by your doctor. This is important. Contact a doctor if:   You have a fever.  Your symptoms get worse.  Your symptoms do not get better within 10 days. Get help right away if:  You have a very bad headache.  You cannot stop throwing up (vomiting).  You have very bad pain or swelling around your face or eyes.  You have trouble seeing.  You feel confused.  Your neck is stiff.  You have trouble breathing. Summary  Sinusitis is swelling of your sinuses. Sinuses are hollow spaces in the bones around your face.  This condition is caused by tissues in your nose that become inflamed or swollen. This traps germs. These can lead to infection.  If you were prescribed an antibiotic medicine, take it as told by your doctor. Do not stop taking it even if you start to feel better.  Keep all follow-up visits as told by your doctor. This is important. This information is not intended to replace advice given to you by your health care provider. Make sure you discuss any questions you have with your health care provider. Document Released: 01/16/2008 Document Revised: 12/30/2017 Document Reviewed: 12/30/2017 Elsevier Interactive Patient Education  2019 [employer] Sinus Tachycardia  Sinus tachycardia is a kind of fast heartbeat. In sinus tachycardia, the heart beats more than 100 times a minute. Sinus tachycardia starts in a part of the heart called the sinus node. Sinus tachycardia may be harmless, or it may be a sign of a serious condition. What are the causes? This condition may be caused by:  Exercise or exertion.  A fever.  Pain.  Loss of body fluids (dehydration).  Severe bleeding (hemorrhage).  Anxiety and stress.  Certain substances, including: ? Alcohol. ? Caffeine. ? Tobacco and nicotine products. ? Cold medicines. ? Illegal drugs.  Medical conditions including: ? Heart disease. ? An infection. ? An overactive thyroid (hyperthyroidism). ? A lack of red blood cells (anemia). What are the signs or symptoms? Symptoms  of this condition include:  A feeling that the heart is beating quickly (palpitations).  Suddenly noticing your heartbeat (cardiac awareness).  Dizziness.  Tiredness (fatigue).  Shortness of breath.  Chest pain.  Nausea.  Fainting. How is this diagnosed? This condition is diagnosed with:  A physical exam.  Other tests, such as: ? Blood tests. ? An electrocardiogram (ECG). This test measures the electrical activity of the heart. ? Ambulatory cardiac monitor. This records your heartbeats for 24 hours or more. You may be referred to a heart specialist (cardiologist). How is this treated? Treatment for this condition depends on the cause or the underlying condition. Treatment may involve:  Treating the underlying condition.  Taking new medicines or changing your current medicines as told by your health care provider.  Making changes to your diet or lifestyle. Follow these instructions at home: Lifestyle   Do not use any products that contain nicotine or tobacco, such as cigarettes and  e-cigarettes. If you need help quitting, ask your health care provider.  Do not use illegal drugs, such as cocaine.  Learn relaxation methods to help you when you get stressed or anxious. These include deep breathing.  Avoid caffeine or other stimulants. Alcohol use   Do not drink alcohol if: ? Your health care provider tells you not to drink. ? You are pregnant, may be pregnant, or are planning to become pregnant.  If you drink alcohol, limit how much you have: ? 0-1 drink a day for women. ? 0-2 drinks a day for men.  Be aware of how much alcohol is in your drink. In the [place] one drink equals one typical bottle of beer (12 oz), one-half glass of wine (5 oz), or one shot of hard liquor (1 oz). General instructions  Drink enough fluids to keep your urine pale yellow.  Take over-the-counter and prescription medicines only as told by your health care provider.  Keep all  follow-up visits as told by your health care provider. This is important. Contact a health care provider if you have:  A fever.  Vomiting or diarrhea that does not go away. Get help right away if you:  Have pain in your chest, upper arms, jaw, or neck.  Become weak or dizzy.  Feel faint.  Have palpitations that do not go away. Summary  In sinus tachycardia, the heart beats more than 100 times a minute.  Sinus tachycardia may be harmless, or it may be a sign of a serious condition.  Treatment for this condition depends on the cause or the underlying condition.  Get help right away if you have pain in your chest, upper arms, jaw, or neck. This information is not intended to replace advice given to you by your health care provider. Make sure you discuss any questions you have with your health care provider. Document Released: 09/06/2004 Document Revised: 09/18/2017 Document Reviewed: 09/18/2017 Elsevier Interactive Patient Education  2019 [employer].

## 2019-01-01 ENCOUNTER — Ambulatory Visit: Payer: Medicare Other

## 2019-01-02 ENCOUNTER — Ambulatory Visit (INDEPENDENT_AMBULATORY_CARE_PROVIDER_SITE_OTHER): Payer: Medicare Other | Admitting: Physician Assistant

## 2019-01-02 ENCOUNTER — Other Ambulatory Visit: Payer: Self-pay | Admitting: Physician Assistant

## 2019-01-02 ENCOUNTER — Encounter: Payer: Self-pay | Admitting: Physician Assistant

## 2019-01-02 VITALS — BP 177/74 | HR 92 | Wt 145.0 lb

## 2019-01-02 DIAGNOSIS — R000 Tachycardia, unspecified: Secondary | ICD-10-CM

## 2019-01-02 DIAGNOSIS — E039 Hypothyroidism, unspecified: Secondary | ICD-10-CM

## 2019-01-02 DIAGNOSIS — E038 Other specified hypothyroidism: Secondary | ICD-10-CM

## 2019-01-02 DIAGNOSIS — J0120 Acute ethmoidal sinusitis, unspecified: Secondary | ICD-10-CM | POA: Diagnosis not present

## 2019-01-02 HISTORY — DX: Tachycardia, unspecified: R00.0

## 2019-01-02 LAB — COMPLETE METABOLIC PANEL WITH GFR
AG Ratio: 1.8 (calc) (ref 1.0–2.5)
ALT: 15 U/L (ref 6–29)
AST: 20 U/L (ref 10–35)
Albumin: 4.4 g/dL (ref 3.6–5.1)
Alkaline phosphatase (APISO): 55 U/L (ref 37–153)
BUN: 12 mg/dL (ref 7–25)
CO2: 28 mmol/L (ref 20–32)
Calcium: 9.7 mg/dL (ref 8.6–10.4)
Chloride: 103 mmol/L (ref 98–110)
Creat: 0.85 mg/dL (ref 0.50–0.99)
GFR, Est African American: 83 mL/min/{1.73_m2} (ref >=60)
GFR, Est Non African American: 71 mL/min/{1.73_m2} (ref >=60)
Globulin: 2.4 g/dL (calc) (ref 1.9–3.7)
Glucose, Bld: 102 mg/dL — ABNORMAL HIGH (ref 65–99)
Potassium: 4.2 mmol/L (ref 3.5–5.3)
Sodium: 140 mmol/L (ref 135–146)
Total Bilirubin: 0.5 mg/dL (ref 0.2–1.2)
Total Protein: 6.8 g/dL (ref 6.1–8.1)

## 2019-01-02 LAB — URINE CULTURE
MICRO NUMBER:: 492246
Result:: NO GROWTH
SPECIMEN QUALITY:: ADEQUATE

## 2019-01-02 LAB — CBC WITH DIFFERENTIAL/PLATELET
Absolute Monocytes: 405 cells/uL (ref 200–950)
Basophils Absolute: 49 cells/uL (ref 0–200)
Basophils Relative: 0.9 %
Eosinophils Absolute: 49 cells/uL (ref 15–500)
Eosinophils Relative: 0.9 %
HCT: 41.9 % (ref 35.0–45.0)
Hemoglobin: 14.4 g/dL (ref 11.7–15.5)
Lymphs Abs: 1577 cells/uL (ref 850–3900)
MCH: 31.4 pg (ref 27.0–33.0)
MCHC: 34.4 g/dL (ref 32.0–36.0)
MCV: 91.3 fL (ref 80.0–100.0)
MPV: 10.7 fL (ref 7.5–12.5)
Monocytes Relative: 7.5 %
Neutro Abs: 3321 cells/uL (ref 1500–7800)
Neutrophils Relative %: 61.5 %
Platelets: 359 10*3/uL (ref 140–400)
RBC: 4.59 10*6/uL (ref 3.80–5.10)
RDW: 11.9 % (ref 11.0–15.0)
Total Lymphocyte: 29.2 %
WBC: 5.4 10*3/uL (ref 3.8–10.8)

## 2019-01-02 LAB — HIV ANTIBODY (ROUTINE TESTING W REFLEX): HIV 1&2 Ab, 4th Generation: NONREACTIVE

## 2019-01-02 LAB — FERRITIN: Ferritin: 32 ng/mL (ref 16–288)

## 2019-01-02 LAB — TSH: TSH: 4.9 mIU/L — ABNORMAL HIGH (ref 0.40–4.50)

## 2019-01-02 MED ORDER — LEVOTHYROXINE SODIUM 25 MCG PO TABS: 25.0000 ug | ORAL_TABLET | Freq: Every day | ORAL | 1 refills | Status: DC

## 2019-01-02 NOTE — Progress Notes (Signed)
CBC normal.  No UTI.  Kidney, liver, glucose look good.   TSH is up meaning thyroid hormones are a little low. We could recheck in 4 weeks since just a minimal elevation or start a low dose thyroid medication. Thoughts?   How is she feeling this morning?

## 2019-01-02 NOTE — Progress Notes (Signed)
Virtual Visit via Video Note  I connected with Jamie [name] on 01/02/19 at  1:40 PM EDT by a video enabled telemedicine application and verified that I am speaking with the correct person using two identifiers.   I discussed the limitations of evaluation and management by telemedicine and the availability of in person appointments. The patient expressed understanding and agreed to proceed.  History of Present Illness: HPI:                                                                Jamie [name] is a 68 y.o. female with PMH of migraine, cervical radiculopathy, anxiety, carotid stenosis  CC: review CT scan results, questions about diagnostic testing  Patient was evaluated on 5/19 and 5/20 by her PCP [name] PA-C.  Reports she had a CT scan for pressure in right side of her head and facial numbness of the right frontotemporal area. She is currently on Augmentin for 10 days for ethmoidal sinusitis. Reports symptoms are unchanged.  She would like to know the significance of the findings in her CT report. She states she does not understand it and she is worried.  She would also like to know why her heart has been racing and why her heart rate fluctuates between 90-low 100's    Past Medical History:  Diagnosis Date  . Anxiety   . Depression   . Diverticulitis   . Diverticulosis   . Headache   . Hyperlipidemia   . IBS (irritable bowel syndrome)   . Lung mass   . Syncope 03/08/2015   Past Surgical History:  Procedure Laterality Date  . ABDOMINAL HYSTERECTOMY    . HEMORRHOID SURGERY    . TUBAL LIGATION    . TUMOR REMOVAL     Left thigh    Social History   Tobacco Use  . Smoking status: Former Smoker    Packs/day: 0.80    Years: 20.00    Pack years: 16.00    Types: Cigarettes    Last attempt to quit: 08/13/1985    Years since quitting: 33.4  . Smokeless tobacco: Never Used  Substance Use Topics  . Alcohol use: Yes    Comment: rare   family history includes  Alcohol abuse in her sister; Allergic rhinitis in her daughter and sister; Arthritis in her sister; Asthma in her sister; Cervical cancer in her sister; Diabetes in her mother; Emphysema in her sister and sister; Fibromyalgia in her sister; Heart disease in her brother and father; Irritable bowel syndrome in her sister; Leukemia in an other family member; Liver cancer in her mother; Pancreatic cancer (age of onset: 16) in her sister; Urticaria in her daughter.    ROS: negative except as noted in the HPI  Medications: Current Outpatient Medications  Medication Sig Dispense Refill  . albuterol (PROVENTIL HFA) 108 (90 Base) MCG/ACT inhaler Inhale 2 puffs into the lungs every 6 (six) hours as needed for wheezing or shortness of breath. 1 Inhaler 0  . ALPRAZolam (XANAX) 0.5 MG tablet Take one tablet as needed up to  twice a day. 60 tablet 2  . amoxicillin-clavulanate (AUGMENTIN) 875-125 MG tablet Take 1 tablet by mouth 2 (two) times daily. Take 1 tab twice a day for 10 days. 20 tablet  0  . B-D 3CC LUER-LOK SYR 25GX1/2" 25G X 1-1/2" 3 ML MISC USE TO INJECT INTO MUSCLE FOR 1 DOSE  0  . clotrimazole (LOTRIMIN) 1 % cream Apply 1 application topically 2 (two) times daily. For 2-4 weeks on affected feet. 60 g 0  . cyclobenzaprine (FLEXERIL) 5 MG tablet Take 1 tablet (5 mg total) by mouth 3 (three) times daily as needed for muscle spasms. 30 tablet 1  . ergocalciferol (VITAMIN D2) 50000 units capsule Take 1 capsule (50,000 Units total) by mouth once a week. 12 capsule 4  . ferrous sulfate 325 (65 FE) MG EC tablet Take 1 tablet (325 mg total) by mouth 3 (three) times daily with meals. 90 tablet 11  . gabapentin (NEURONTIN) 300 MG capsule Take 1 capsule (300 mg total) by mouth 2 (two) times daily. 180 capsule 1  . linaclotide (LINZESS) 290 MCG CAPS capsule Take 1 capsule (290 mcg total) by mouth daily. 30 capsule 3  . meloxicam (MOBIC) 7.5 MG tablet TAKE 1 TABLET(7.5 MG) BY MOUTH DAILY 30 tablet 1  . NON  FORMULARY Take 1 capsule by mouth 3 (three) times daily. Herbal supplements for gastritis    . omeprazole (PRILOSEC) 40 MG capsule Take 1 capsule (40 mg total) by mouth daily. 90 capsule 4  . ondansetron (ZOFRAN) 8 MG tablet Take 1 tablet (8 mg total) by mouth every 8 (eight) hours as needed for nausea or vomiting. 20 tablet 0  . Prasterone (INTRAROSA) 6.5 MG INST Place 1 application vaginally at bedtime. 28 each 6  . conjugated estrogens (PREMARIN) vaginal cream Apply 3 times a week at night. (Patient not taking: Reported on 01/02/2019) 42.5 g 2  . levothyroxine (SYNTHROID) 25 MCG tablet Take 1 tablet (25 mcg total) by mouth daily before breakfast. 30 tablet 1   No current facility-administered medications for this visit.    Allergies  Allergen Reactions  . Iodine Rash  . Ciprofloxacin     Facial numbness  . Iodinated Diagnostic Agents Other (See Comments)    Face and chest felt "hot", throat felt tight.   . Peanut Butter Flavor Nausea And Vomiting    N/V  . Shellfish Allergy Swelling       Objective:  BP (!) 177/74   Pulse 92   Wt 145 lb (65.8 kg)   SpO2 96%   BMI 24.89 kg/m  Vitals:   01/02/19 1327 01/02/19 1349  BP: (!) 177/74   Pulse: (!) 101 92  SpO2: 96%   Gen:  alert, not ill-appearing, no distress, appropriate for age HEENT: head normocephalic without obvious abnormality, conjunctiva and cornea clear, trachea midline Pulm: Normal work of breathing, normal phonation Neuro: alert and oriented x 3, no tremor MSK: extremities atraumatic, normal gait and station Skin: intact, no rashes on exposed skin, no jaundice, no cyanosis Psych: well-groomed, cooperative, good eye contact, she is anxious  BP Readings from Last 3 Encounters:  01/02/19 (!) 177/74  12/31/18 (!) 130/57  12/30/18 122/69   Pulse Readings from Last 3 Encounters:  01/02/19 92  12/31/18 (!) 114  12/30/18 97      Results for orders placed or performed in visit on 12/31/18 (from the past 72  hour(s))  POCT Urinalysis Dipstick     Status: Abnormal   Collection Time: 12/31/18  2:03 PM  Result Value Ref Range   Color, UA yellow    Clarity, UA clear    Glucose, UA Negative Negative   Bilirubin, UA neg  Ketones, UA neg    Spec Grav, UA <=1.005 (A) 1.010 - 1.025   Blood, UA neg    pH, UA 6.0 5.0 - 8.0   Protein, UA Negative Negative   Urobilinogen, UA 0.2 0.2 or 1.0 E.U./dL   Nitrite, UA neg    Leukocytes, UA Small (1+) (A) Negative   Appearance     Odor    Urine Culture     Status: None   Collection Time: 12/31/18  2:03 PM  Result Value Ref Range   MICRO NUMBER: [id number]    SPECIMEN QUALITY: Adequate    Sample Source NOT GIVEN    STATUS: FINAL    Result: No Growth   HIV antibody (with reflex)     Status: None   Collection Time: 12/31/18  2:22 PM  Result Value Ref Range   HIV 1&2 Ab, 4th Generation NON-REACTIVE NON-REACTI    Comment: HIV-1 antigen and HIV-1/HIV-2 antibodies were not detected. There is no laboratory evidence of HIV infection. Marland Kitchen PLEASE NOTE: This information has been disclosed to you from records whose confidentiality may be protected by state law.  If your state requires such protection, then the state law prohibits you from making any further disclosure of the information without the specific written consent of the person to whom it pertains, or as otherwise permitted by law. A general authorization for the release of medical or other information is NOT sufficient for this purpose. . For additional information please refer to http://education.[facility].com/faq/FAQ106 (This link is being provided for informational/ educational purposes only.) . Marland Kitchen The performance of this assay has not been clinically validated in patients less than 41 years old. Marland Kitchen   CBC with Differential/Platelet     Status: None   Collection Time: 12/31/18  2:22 PM  Result Value Ref Range   WBC 5.4 3.8 - 10.8 Thousand/uL   RBC 4.59 3.80 - 5.10 Million/uL    Hemoglobin 14.4 11.7 - 15.5 g/dL   HCT 41.9 35.0 - 45.0 %   MCV 91.3 80.0 - 100.0 fL   MCH 31.4 27.0 - 33.0 pg   MCHC 34.4 32.0 - 36.0 g/dL   RDW 11.9 11.0 - 15.0 %   Platelets 359 140 - 400 Thousand/uL   MPV 10.7 7.5 - 12.5 fL   Neutro Abs 3,321 1,500 - 7,800 cells/uL   Lymphs Abs 1,577 850 - 3,900 cells/uL   Absolute Monocytes 405 200 - 950 cells/uL   Eosinophils Absolute 49 15 - 500 cells/uL   Basophils Absolute 49 0 - 200 cells/uL   Neutrophils Relative % 61.5 %   Total Lymphocyte 29.2 %   Monocytes Relative 7.5 %   Eosinophils Relative 0.9 %   Basophils Relative 0.9 %  TSH     Status: Abnormal   Collection Time: 12/31/18  2:22 PM  Result Value Ref Range   TSH 4.90 (H) 0.40 - 4.50 mIU/L  Ferritin     Status: None   Collection Time: 12/31/18  2:22 PM  Result Value Ref Range   Ferritin 32 16 - 288 ng/mL  COMPLETE METABOLIC PANEL WITH GFR     Status: Abnormal   Collection Time: 12/31/18  2:22 PM  Result Value Ref Range   Glucose, Bld 102 (H) 65 - 99 mg/dL    Comment: .            Fasting reference interval . For someone without known diabetes, a glucose value between 100 and 125 mg/dL is consistent with prediabetes and should  be confirmed with a follow-up test. .    BUN 12 7 - 25 mg/dL   Creat 0.85 0.50 - 0.99 mg/dL    Comment: For patients >18 years of age, the reference limit for Creatinine is approximately 13% higher for people identified as African-American. .    GFR, Est Non African American 71 > OR = 60 mL/min/1.38m2   GFR, Est African American 83 > OR = 60 mL/min/1.62m2   BUN/Creatinine Ratio NOT APPLICABLE 6 - 22 (calc)   Sodium 140 135 - 146 mmol/L   Potassium 4.2 3.5 - 5.3 mmol/L   Chloride 103 98 - 110 mmol/L   CO2 28 20 - 32 mmol/L   Calcium 9.7 8.6 - 10.4 mg/dL   Total Protein 6.8 6.1 - 8.1 g/dL   Albumin 4.4 3.6 - 5.1 g/dL   Globulin 2.4 1.9 - 3.7 g/dL (calc)   AG Ratio 1.8 1.0 - 2.5 (calc)   Total Bilirubin 0.5 0.2 - 1.2 mg/dL   Alkaline  phosphatase (APISO) 55 37 - 153 U/L   AST 20 10 - 35 U/L   ALT 15 6 - 29 U/L   Ct Head Wo Contrast  Result Date: 12/31/2018 CLINICAL DATA:  Headache with right-sided facial numbness and tingling EXAM: CT HEAD WITHOUT CONTRAST TECHNIQUE: Contiguous axial images were obtained from the base of the skull through the vertex without intravenous contrast. COMPARISON:  Dec 30, 2014 FINDINGS: Brain: The ventricles are normal in size and configuration. There is no intracranial mass, hemorrhage, extra-axial fluid collection, or midline shift. Brain parenchyma appears unremarkable. No evident acute infarct. Vascular: There is no hyperdense vessel. There is calcification in each carotid siphon region. Skull: The bony calvarium appears intact. Sinuses/Orbits: There is mucosal thickening in several ethmoid air cells. Other visualized paranasal sinuses are clear. Visualized orbits appear symmetric bilaterally. Other: Visualized mastoid air cells are clear. IMPRESSION: Brain parenchyma appears unremarkable.  No mass or hemorrhage. There are foci of arterial vascular calcification. There is mucosal thickening in several ethmoid air cells. Electronically Signed   By: Lowella Grip III M.D.   On: 12/31/2018 12:48      Assessment and Plan: 68 y.o. female with   .Tatiana was seen today for tachycardia.  Diagnoses and all orders for this visit:  Subclinical hypothyroidism -     levothyroxine (SYNTHROID) 25 MCG tablet; Take 1 tablet (25 mcg total) by mouth daily before breakfast. -     TSH; Future  Subacute ethmoidal sinusitis   Reviewed results of CT scan and answered patient's questions.  Reassurance that the arterial calcifications are likely age-related/migraine related and not of any clinical concern. Explained that the mucosal thickening in her ethmoid sinus is likely sinusitis for which she has been treated appropriately by her PCP with Augmentin for 10 days.  I counseled her to continue this  medication.  Discussed that her subclinical hypothyroidism could be contributing to the fluctuations in her heart rate.  Anxiety is likely contributory.  While hypothyroidism typically causes bradycardia it is also possible to cause tachycardia.  She was amenable to starting low-dose thyroid medication.  Starting levothyroxine 25 mcg q. AC.  Recheck TSH in 6 weeks.  Elevated home BP reading using a wrist cuff. I do not think the wrist cuff is accurate. Her BP was normotensive in the office 2 days ago.  Reassurance.   Follow Up Instructions:    I discussed the assessment and treatment plan with the patient. The patient was provided an opportunity to  ask questions and all were answered. The patient agreed with the plan and demonstrated an understanding of the instructions.   The patient was advised to call back or seek an in-person evaluation if the symptoms worsen or if the condition fails to improve as anticipated.  I provided approx 10 minutes of non-face-to-face time during this encounter.   Trixie Dredge, [place]

## 2019-01-06 ENCOUNTER — Telehealth: Payer: Self-pay | Admitting: Physician Assistant

## 2019-01-06 DIAGNOSIS — D510 Vitamin B12 deficiency anemia due to intrinsic factor deficiency: Secondary | ICD-10-CM

## 2019-01-06 DIAGNOSIS — R29818 Other symptoms and signs involving the nervous system: Secondary | ICD-10-CM

## 2019-01-06 DIAGNOSIS — J0120 Acute ethmoidal sinusitis, unspecified: Secondary | ICD-10-CM

## 2019-01-06 DIAGNOSIS — E559 Vitamin D deficiency, unspecified: Secondary | ICD-10-CM

## 2019-01-06 NOTE — Telephone Encounter (Signed)
Will you please call patient.  1 CT showed sinusitis not meningitis. There was no abscess or inflammation seen of the meninges.  CT does not always show signs of meningitis the only way to 100 percent rule out is lumbar puncture. I do not think your symptoms are consistent with meningitis. Your CBC was great so certainly not supportive of bacterial meningitis. You did not have a fever. You are not having seizures.   We could add steroid to see if helps with any inflammation. Are you ok with that? Numbness and tingling could still be some inflammation of the trigeminal nerve, do you have a rash?(with all your stress could be shingles trying to present itself), could be stress.   We can repeat labs to check cbc and add ESR for inflammation.

## 2019-01-06 NOTE — Telephone Encounter (Signed)
Ok thanks 

## 2019-01-06 NOTE — Telephone Encounter (Addendum)
Jamie [name]: Pt called. She wants to know how do you know "it was bacterial versus viral" because there is a 50/50 chance she may have meningitis.. She's been on meds for 6 days today and she's still having numbness in her head and heart goes crazy sometimes.  Thanks

## 2019-01-06 NOTE — Telephone Encounter (Signed)
Pt advised. She is going to continue antibiotic and get labs done. Requested we also draw for B12 and vit d. Labs ordered. Pt scheduled for [place] [facility] draw since [facility] cannot go [facility] due to owing them money.   Based on labs, she will decide if she would like the steroid.

## 2019-01-08 ENCOUNTER — Other Ambulatory Visit: Payer: Self-pay

## 2019-01-08 ENCOUNTER — Ambulatory Visit (INDEPENDENT_AMBULATORY_CARE_PROVIDER_SITE_OTHER): Payer: Medicare Other | Admitting: Family Medicine

## 2019-01-08 DIAGNOSIS — R29818 Other symptoms and signs involving the nervous system: Secondary | ICD-10-CM | POA: Diagnosis not present

## 2019-01-08 NOTE — Progress Notes (Signed)
Agree with documentation as above.   Casimiro Lienhard, MD  

## 2019-01-08 NOTE — Progress Notes (Signed)
[facility] draw only.

## 2019-01-09 ENCOUNTER — Encounter: Payer: Self-pay | Admitting: Physician Assistant

## 2019-01-09 ENCOUNTER — Telehealth: Payer: Self-pay | Admitting: Physician Assistant

## 2019-01-09 LAB — B12 AND FOLATE PANEL
Folate: >24 ng/mL
Vitamin B-12: 220 pg/mL (ref 200–1100)

## 2019-01-09 LAB — VITAMIN D 25 HYDROXY (VIT D DEFICIENCY, FRACTURES): Vit D, 25-Hydroxy: 46 ng/mL (ref 30–100)

## 2019-01-09 LAB — CBC WITH DIFFERENTIAL/PLATELET
Absolute Monocytes: 426 cells/uL (ref 200–950)
Basophils Absolute: 62 cells/uL (ref 0–200)
Basophils Relative: 1.1 %
Eosinophils Absolute: 39 cells/uL (ref 15–500)
Eosinophils Relative: 0.7 %
HCT: 39.6 % (ref 35.0–45.0)
Hemoglobin: 13.7 g/dL (ref 11.7–15.5)
Lymphs Abs: 1266 cells/uL (ref 850–3900)
MCH: 31.4 pg (ref 27.0–33.0)
MCHC: 34.6 g/dL (ref 32.0–36.0)
MCV: 90.8 fL (ref 80.0–100.0)
MPV: 10.5 fL (ref 7.5–12.5)
Monocytes Relative: 7.6 %
Neutro Abs: 3808 cells/uL (ref 1500–7800)
Neutrophils Relative %: 68 %
Platelets: 339 10*3/uL (ref 140–400)
RBC: 4.36 10*6/uL (ref 3.80–5.10)
RDW: 11.8 % (ref 11.0–15.0)
Total Lymphocyte: 22.6 %
WBC: 5.6 10*3/uL (ref 3.8–10.8)

## 2019-01-09 LAB — SURESWAB, VAGINOSIS/VAGINITIS PLUS
Atopobium vaginae: NOT DETECTED Log cells/mL
C. albicans, DNA: NOT DETECTED
C. glabrata, DNA: NOT DETECTED
C. parapsilosis, DNA: NOT DETECTED
C. trachomatis RNA, TMA: NOT DETECTED
C. tropicalis, DNA: NOT DETECTED
Gardnerella vaginalis: 7.9 Log (cells/mL)
LACTOBACILLUS SPECIES: NOT DETECTED Log cells/mL
MEGASPHAERA SPECIES: NOT DETECTED Log cells/mL
N. gonorrhoeae RNA, TMA: NOT DETECTED
Trichomonas vaginalis RNA: NOT DETECTED

## 2019-01-09 LAB — SEDIMENTATION RATE: Sed Rate: 9 mm/h (ref 0–30)

## 2019-01-09 NOTE — Telephone Encounter (Signed)
Call pt: CBC looks great. WBC looks perfect. No sign of systemic infection.  Inflammation rate low.  Vitamin D looks great. Keep up the same vitamin D regimen.   b12 pending.   How are you feeling today?

## 2019-01-09 NOTE — Telephone Encounter (Signed)
See mychart msg to patient.

## 2019-01-12 ENCOUNTER — Encounter: Payer: Self-pay | Admitting: Physician Assistant

## 2019-01-12 MED ORDER — METRONIDAZOLE 500 MG PO TABS: 500.0000 mg | ORAL_TABLET | Freq: Two times a day (BID) | ORAL | 0 refills | Status: DC

## 2019-01-12 MED ORDER — METRONIDAZOLE 0.75 % VA GEL: VAGINAL | 1 refills | Status: DC

## 2019-01-12 NOTE — Progress Notes (Signed)
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 This is causing the discharge you are experiencing. Are you ok with me sending oral metronidazole and vaginal metronidazole?   How are you feeling otherwise?

## 2019-01-14 ENCOUNTER — Other Ambulatory Visit: Payer: Self-pay | Admitting: Physician Assistant

## 2019-01-14 DIAGNOSIS — R790 Abnormal level of blood mineral: Secondary | ICD-10-CM

## 2019-01-14 DIAGNOSIS — D508 Other iron deficiency anemias: Secondary | ICD-10-CM

## 2019-01-16 ENCOUNTER — Other Ambulatory Visit: Payer: Self-pay | Admitting: Physician Assistant

## 2019-01-16 DIAGNOSIS — R05 Cough: Secondary | ICD-10-CM

## 2019-01-16 DIAGNOSIS — R079 Chest pain, unspecified: Secondary | ICD-10-CM

## 2019-01-16 DIAGNOSIS — R0602 Shortness of breath: Secondary | ICD-10-CM

## 2019-01-16 DIAGNOSIS — R059 Cough, unspecified: Secondary | ICD-10-CM

## 2019-02-08 ENCOUNTER — Other Ambulatory Visit: Payer: Self-pay | Admitting: Physician Assistant

## 2019-02-08 DIAGNOSIS — E038 Other specified hypothyroidism: Secondary | ICD-10-CM

## 2019-02-08 DIAGNOSIS — E039 Hypothyroidism, unspecified: Secondary | ICD-10-CM

## 2019-02-22 ENCOUNTER — Other Ambulatory Visit: Payer: Self-pay | Admitting: Physician Assistant

## 2019-02-22 DIAGNOSIS — K2100 Gastro-esophageal reflux disease with esophagitis, without bleeding: Secondary | ICD-10-CM

## 2019-02-22 DIAGNOSIS — B353 Tinea pedis: Secondary | ICD-10-CM

## 2019-02-22 DIAGNOSIS — E559 Vitamin D deficiency, unspecified: Secondary | ICD-10-CM

## 2019-02-23 ENCOUNTER — Telehealth: Payer: Self-pay | Admitting: Physician Assistant

## 2019-02-23 ENCOUNTER — Other Ambulatory Visit: Payer: Self-pay | Admitting: Physician Assistant

## 2019-02-23 DIAGNOSIS — R079 Chest pain, unspecified: Secondary | ICD-10-CM

## 2019-02-23 DIAGNOSIS — R059 Cough, unspecified: Secondary | ICD-10-CM

## 2019-02-23 DIAGNOSIS — R05 Cough: Secondary | ICD-10-CM

## 2019-02-23 DIAGNOSIS — R0602 Shortness of breath: Secondary | ICD-10-CM

## 2019-02-23 NOTE — Telephone Encounter (Signed)
Patient states that she is out of amoxicillin-clavulanate (AUGMENTIN) 875-125 MG tablet [[id number]] and she is having a flare up. States that she wanted to know if she can get it called in or if she needed an appointment first please advise.

## 2019-02-23 NOTE — Telephone Encounter (Signed)
Yes needs appt for abx. Can do virtual unless having severe symptoms/nausea/vomiting?

## 2019-02-24 ENCOUNTER — Ambulatory Visit (INDEPENDENT_AMBULATORY_CARE_PROVIDER_SITE_OTHER): Payer: Medicare Other | Admitting: Physician Assistant

## 2019-02-24 ENCOUNTER — Encounter: Payer: Self-pay | Admitting: Physician Assistant

## 2019-02-24 VITALS — BP 120/70 | Ht 64.0 in | Wt 147.0 lb

## 2019-02-24 DIAGNOSIS — Z8719 Personal history of other diseases of the digestive system: Secondary | ICD-10-CM | POA: Diagnosis not present

## 2019-02-24 DIAGNOSIS — R1032 Left lower quadrant pain: Secondary | ICD-10-CM | POA: Diagnosis not present

## 2019-02-24 MED ORDER — ONDANSETRON 8 MG PO TBDP: 8.0000 mg | ORAL_TABLET | Freq: Three times a day (TID) | ORAL | 1 refills | Status: DC | PRN

## 2019-02-24 MED ORDER — AMOXICILLIN-POT CLAVULANATE 875-125 MG PO TABS: 1.0000 | ORAL_TABLET | Freq: Two times a day (BID) | ORAL | 0 refills | Status: DC
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
## 2019-02-24 NOTE — Progress Notes (Signed)
Diverticulitis trouble x 4 days. Diarrhea/constipation. Abdominal pain.  Has been eating cucumbers. Wants refill of Augmentin.

## 2019-04-01 ENCOUNTER — Other Ambulatory Visit: Payer: Self-pay | Admitting: Physician Assistant

## 2019-04-01 DIAGNOSIS — E039 Hypothyroidism, unspecified: Secondary | ICD-10-CM

## 2019-04-01 DIAGNOSIS — E038 Other specified hypothyroidism: Secondary | ICD-10-CM

## 2019-04-01 NOTE — Telephone Encounter (Signed)
Needs labs done

## 2019-04-03 ENCOUNTER — Telehealth: Payer: Self-pay | Admitting: Physician Assistant

## 2019-04-03 DIAGNOSIS — F419 Anxiety disorder, unspecified: Secondary | ICD-10-CM

## 2019-04-03 MED ORDER — ALPRAZOLAM 0.5 MG PO TABS: ORAL_TABLET | ORAL | 2 refills | Status: DC

## 2019-04-03 MED ORDER — LINACLOTIDE 290 MCG PO CAPS: 290.0000 ug | ORAL_CAPSULE | Freq: Every day | ORAL | 3 refills | Status: DC

## 2019-04-03 NOTE — Telephone Encounter (Signed)
Pt needs xanax and linzess refilled.

## 2019-05-07 ENCOUNTER — Telehealth: Payer: Self-pay | Admitting: Physician Assistant

## 2019-05-07 DIAGNOSIS — F419 Anxiety disorder, unspecified: Secondary | ICD-10-CM

## 2019-05-07 MED ORDER — ALPRAZOLAM 0.5 MG PO TABS: ORAL_TABLET | ORAL | 2 refills | Status: DC

## 2019-05-07 NOTE — Telephone Encounter (Signed)
Came in with sister requesting refills.

## 2019-05-11 ENCOUNTER — Encounter: Payer: Self-pay | Admitting: Physician Assistant

## 2019-06-02 ENCOUNTER — Encounter: Payer: Self-pay | Admitting: Physician Assistant

## 2019-06-02 DIAGNOSIS — N8189 Other female genital prolapse: Secondary | ICD-10-CM

## 2019-06-02 MED ORDER — PANTOPRAZOLE SODIUM 40 MG PO TBEC: 40.0000 mg | DELAYED_RELEASE_TABLET | Freq: Every day | ORAL | 3 refills | Status: DC

## 2019-06-04 NOTE — Telephone Encounter (Signed)
Called and confirmed that Jamie [name] does pelvic floor therapy. Order signed.

## 2019-06-05 ENCOUNTER — Encounter: Payer: Self-pay | Admitting: Physician Assistant

## 2019-06-10 ENCOUNTER — Other Ambulatory Visit: Payer: Self-pay | Admitting: Physician Assistant

## 2019-06-10 DIAGNOSIS — M5412 Radiculopathy, cervical region: Secondary | ICD-10-CM

## 2019-06-10 DIAGNOSIS — M542 Cervicalgia: Secondary | ICD-10-CM

## 2019-06-11 ENCOUNTER — Encounter: Payer: Self-pay | Admitting: Physician Assistant

## 2019-07-02 ENCOUNTER — Encounter: Payer: Self-pay | Admitting: Physician Assistant

## 2019-07-03 ENCOUNTER — Encounter: Payer: Self-pay | Admitting: Physician Assistant

## 2019-07-06 ENCOUNTER — Ambulatory Visit (INDEPENDENT_AMBULATORY_CARE_PROVIDER_SITE_OTHER): Payer: Medicare Other | Admitting: Physician Assistant

## 2019-07-06 ENCOUNTER — Telehealth: Payer: Self-pay | Admitting: Physician Assistant

## 2019-07-06 VITALS — BP 110/80 | Temp 98.0°F | Ht 64.0 in | Wt 147.0 lb

## 2019-07-06 DIAGNOSIS — R140 Abdominal distension (gaseous): Secondary | ICD-10-CM

## 2019-07-06 DIAGNOSIS — K2990 Gastroduodenitis, unspecified, without bleeding: Secondary | ICD-10-CM

## 2019-07-06 DIAGNOSIS — Z8719 Personal history of other diseases of the digestive system: Secondary | ICD-10-CM

## 2019-07-06 DIAGNOSIS — R1032 Left lower quadrant pain: Secondary | ICD-10-CM

## 2019-07-06 DIAGNOSIS — R1013 Epigastric pain: Secondary | ICD-10-CM

## 2019-07-06 HISTORY — DX: Epigastric pain: R10.13

## 2019-07-06 MED ORDER — AMOXICILLIN-POT CLAVULANATE 875-125 MG PO TABS: 1.0000 | ORAL_TABLET | Freq: Two times a day (BID) | ORAL | 0 refills | Status: DC

## 2019-07-06 MED ORDER — SUCRALFATE 1 G PO TABS: 1.0000 g | ORAL_TABLET | Freq: Three times a day (TID) | ORAL | 0 refills | Status: DC

## 2019-07-06 NOTE — Telephone Encounter (Signed)
Appt made

## 2019-07-06 NOTE — Telephone Encounter (Signed)
Please call and schedule

## 2019-07-06 NOTE — Progress Notes (Signed)
Abdominal pain x 4 days From under breast to pelvic area  Travels around both sides - mainly on left Stinging and burning Had nausea 3-4 days, it's gone away No vomiting

## 2019-07-06 NOTE — Telephone Encounter (Signed)
See other note. Appt. Made for for today.

## 2019-07-06 NOTE — Telephone Encounter (Signed)
Yes ok to add.

## 2019-07-06 NOTE — Telephone Encounter (Signed)
Patient called and she is having some abdominal pain for 4 days. She is having some pain under her breast as well but she does not have chest pain. She is having some swelling in her stomach and feels super bloated. She had one episode of nausea and then it went away. She is not able to wear a bra. She was offered to go to urgent care and ER and she declined. She is requesting a virtual with you around lunch and you know her problems and no one else does. I did strongly encouraged to go for  an evaluation with Urgent Care and ER and she strongly declined. Cana you please work her in during lunch today so she can get a scan? Please advise.
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
## 2019-07-07 ENCOUNTER — Encounter: Payer: Self-pay | Admitting: Physician Assistant

## 2019-07-08 ENCOUNTER — Encounter: Payer: Self-pay | Admitting: Physician Assistant

## 2019-07-08 ENCOUNTER — Emergency Department (INDEPENDENT_AMBULATORY_CARE_PROVIDER_SITE_OTHER)
Admission: EM | Admit: 2019-07-08 | Discharge: 2019-07-08 | Discharge status: Against Medical Advice | Payer: Medicare Other | Source: Home / Self Care | Attending: Family Medicine | Admitting: Family Medicine

## 2019-07-08 ENCOUNTER — Other Ambulatory Visit: Payer: Self-pay

## 2019-07-08 ENCOUNTER — Encounter: Payer: Self-pay | Admitting: Emergency Medicine

## 2019-07-08 DIAGNOSIS — R5383 Other fatigue: Secondary | ICD-10-CM | POA: Diagnosis not present

## 2019-07-08 DIAGNOSIS — J069 Acute upper respiratory infection, unspecified: Secondary | ICD-10-CM

## 2019-07-08 LAB — POC SARS CORONAVIRUS 2 AG -  ED: SARS Coronavirus 2 Ag: NEGATIVE

## 2019-07-08 MED ORDER — SODIUM CHLORIDE 0.9 % IV SOLN: INTRAVENOUS | Status: DC

## 2019-07-08 NOTE — ED Provider Notes (Signed)
[name]    CSN: [id number] Arrival date & time: 07/08/19  4174      History   Chief Complaint Chief Complaint  Patient presents with  . Headache    HPI Jamie [name] is a 68 y.o. female.   Patient has not felt well for about a week, with vague abdominal bloating and headache.  She has been prescribed Augmentin for possible diverticulitis.  She has developed a dry cough during the past two days but denies pleuritic pain or shortness of breath.  She has a vague soreness in her throat and describes burning in her tongue, and possibly some change in taste today.  She denies fever but has felt fatigued with onset of myalgias.  The history is provided by the patient.    Past Medical History:  Diagnosis Date  . Anxiety   . Depression   . Diverticulitis   . Diverticulosis   . Headache   . Hyperlipidemia   . IBS (irritable bowel syndrome)   . Lung mass   . Syncope 03/08/2015    Patient Active Problem List   Diagnosis Date Noted  . Epigastric pain 07/06/2019  . Tachycardia 01/02/2019  . Numbness and tingling of right side of face 12/31/2018  . Right-sided headache 12/31/2018  . Cervical radiculopathy 10/01/2018  . Vaginal irritation 09/15/2018  . Anal itching 09/15/2018  . Urine frequency 06/13/2018  . ETD (Eustachian tube dysfunction), right 06/11/2018  . Fatigue 01/15/2018  . Heel pain, bilateral 01/15/2018  . Muscle strain of upper back 01/15/2018  . Acute bilateral low back pain with bilateral sciatica 10/07/2017  . Pelvic floor weakness 09/16/2017  . Bilateral headaches 09/16/2017  . Milk allergy 09/02/2017  . Vitamin D deficiency 08/23/2017  . Iron deficiency anemia secondary to inadequate dietary iron intake 08/23/2017  . Abnormal urine odor 08/23/2017  . Atypical chest pain 08/23/2017  . Bloating 08/23/2017  . Anxiety 08/23/2017  . Carotid stenosis, asymptomatic, bilateral 05/08/2017  . History of excessive cerumen 05/08/2017  .  Cardiovascular risk factor 05/08/2017  . Gastritis and duodenitis 04/15/2017  . 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 Splenic artery aneurysm ([place]) 03/22/2017  . Vision changes 01/25/2017  . Hematuria 01/25/2017  . Hx of diverticulitis of colon 01/25/2017  . Acute left lower quadrant pain 01/25/2017  . Exposure to chemical inhalation 11/06/2016  . Right knee pain 11/05/2016  . Former smoker 05/14/2016  . Hypopigmentation 01/11/2016  . Anaphylactic reaction due to food 01/11/2016  . Vaginal dryness 01/11/2016  . Multiple food allergies 01/11/2016  . 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 Carpal tunnel syndrome 10/17/2015  . Left arm numbness 10/13/2015  . Neck pain 10/13/2015  . Pernicious anemia 10/13/2015  . Chronically dry eyes 10/13/2015  . DDD (degenerative disc disease), cervical 08/31/2015  . Mid back pain on left side 07/20/2015  . Hair loss 07/20/2015  . Family history of renal cancer 07/20/2015  . History of shingles 07/19/2015  . Low iron stores 07/19/2015  . Subclinical hypothyroidism 07/19/2015  . Constipation 07/19/2015  . Hyperpigmentation of skin 07/19/2015  . Depression with anxiety 06/18/2015  . Normocytic anemia 06/18/2015  . Diverticulitis of large intestine with abscess without bleeding   . B12 deficiency 06/13/2015  . Migraine with aura and with status migrainosus, not intractable 04/20/2015  . Lung mass 04/20/2015  . Generalized abdominal pain 01/24/2014  . Osteopenia 01/20/2014  . Subacute ethmoidal sinusitis 04/30/2013  . Seasonal and perennial allergic rhinitis 11/06/2011  . Diverticulosis  of large intestine 07/03/2011  . Hyperlipidemia 12/13/2010  . ONYCHOMYCOSIS, TOENAILS 06/09/2010  . ANXIETY DEPRESSION 06/09/2010    Past Surgical History:  Procedure Laterality Date  . ABDOMINAL HYSTERECTOMY    . HEMORRHOID SURGERY    . TUBAL LIGATION    . TUMOR REMOVAL     Left thigh     OB History   No obstetric history on file.      Home  Medications    Prior to Admission medications   Medication Sig Start Date End Date Taking? Authorizing Provider  albuterol (VENTOLIN HFA) 108 (90 Base) MCG/ACT inhaler INHALE 2 PUFFS INTO THE LUNGS EVERY 6 HOURS AS NEEDED FOR WHEEZING OR SHORTNESS OF BREATH 02/23/19   Breeback, [name] L, PA-C  ALPRAZolam (XANAX) 0.5 MG tablet Take one tablet as needed up to  twice a day. 05/07/19   Breeback, [name] L, PA-C  amoxicillin-clavulanate (AUGMENTIN) 875-125 MG tablet Take 1 tablet by mouth 2 (two) times daily. Take 1 tab twice a day for 10 days. 07/06/19   Breeback, [name] L, PA-C  B-D 3CC LUER-LOK SYR 25GX1/2" 25G X 1-1/2" 3 ML MISC USE TO INJECT INTO MUSCLE FOR 1 DOSE 09/13/17   [provider]  clotrimazole (LOTRIMIN) 1 % cream APPLY TO THE AFFECTED AREA ON FEET TWICE DAILY FOR 2-4 WEEKS 02/23/19   Breeback, [name] L, PA-C  conjugated estrogens (PREMARIN) vaginal cream Apply 3 times a week at night. 09/10/18   Breeback, [name] L, PA-C  cyclobenzaprine (FLEXERIL) 5 MG tablet Take 1 tablet (5 mg total) by mouth 3 (three) times daily as needed for muscle spasms. 09/30/18   Breeback, [name] L, PA-C  ferrous sulfate 325 (65 FE) MG EC tablet TAKE 1 TABLET(325 MG) BY MOUTH THREE TIMES DAILY WITH MEALS 01/14/19   Breeback, [name] L, PA-C  gabapentin (NEURONTIN) 300 MG capsule Take 1 capsule (300 mg total) by mouth 2 (two) times daily. 12/23/18   [name], PA-C  levothyroxine (SYNTHROID) 25 MCG tablet Take 1 tablet (25 mcg total) by mouth daily before breakfast. Needs Labs Done 04/01/19   [name], PA-C  linaclotide (LINZESS) 290 MCG CAPS capsule Take 1 capsule (290 mcg total) by mouth daily. 04/03/19   Breeback, [name] L, PA-C  NON FORMULARY Take 1 capsule by mouth 3 (three) times daily. Herbal supplements for gastritis    [provider]  omeprazole (PRILOSEC) 40 MG capsule TAKE 1 CAPSULE(40 MG) BY MOUTH DAILY 02/23/19   Breeback, [name] L, PA-C  ondansetron (ZOFRAN-ODT) 8 MG disintegrating tablet Take 1  tablet (8 mg total) by mouth every 8 (eight) hours as needed for nausea. 02/24/19   Breeback, [name] L, PA-C  pantoprazole (PROTONIX) 40 MG tablet Take 1 tablet (40 mg total) by mouth daily. 06/02/19   Breeback, [name] L, PA-C  Prasterone (INTRAROSA) 6.5 MG INST Place 1 application vaginally at bedtime. 08/19/18   Trixie Dredge, PA-C  sucralfate (CARAFATE) 1 g tablet Take 1 tablet (1 g total) by mouth 4 (four) times daily -  with meals and at bedtime. 07/06/19   Breeback, [name] L, PA-C  Vitamin D, Ergocalciferol, (DRISDOL) 1.25 MG (50000 UT) CAPS capsule TAKE 1 CAPSULE BY MOUTH 1 TIME A WEEK 02/23/19   [name], PA-C    Family History Family History  Problem Relation Age of Onset  . Diabetes Mother   . Liver cancer Mother        renal cell   . Heart disease Father  first MI in 31s  . Cervical cancer Sister   . Allergic rhinitis Sister   . Asthma Sister   . Heart disease Brother        first MI in 68s  . Alcohol abuse Sister   . Fibromyalgia Sister   . Pancreatic cancer Sister 53  . Arthritis Sister        osteoarthritis  . Emphysema Sister   . Irritable bowel syndrome Sister   . Emphysema Sister   . Leukemia Other        grandson  . Urticaria Daughter   . Allergic rhinitis Daughter   . Stomach cancer Neg Hx   . Colon cancer Neg Hx   . Angioedema Neg Hx   . Eczema Neg Hx   . Immunodeficiency Neg Hx     Social History Social History   Tobacco Use  . Smoking status: Former Smoker    Packs/day: 0.80    Years: 20.00    Pack years: 16.00    Types: Cigarettes    Quit date: 08/13/1985    Years since quitting: 33.9  . Smokeless tobacco: Never Used  Substance Use Topics  . Alcohol use: Yes    Comment: rare  . Drug use: No     Allergies   Iodine, Ciprofloxacin, Iodinated diagnostic agents, Peanut butter flavor, and Shellfish allergy   Review of Systems Review of Systems + sore throat + cough No pleuritic pain but feels tight in anterior chest.  No wheezing + nasal congestion ? post-nasal drainage No sinus pain/pressure No itchy/red eyes No earache No hemoptysis ? SOB No fever, + chills No nausea No vomiting ? abdominal pain No diarrhea No urinary symptoms No skin rash + fatigue + myalgias + headache Used OTC meds without relief   Physical Exam Triage Vital Signs ED Triage Vitals [07/08/19 1009]  Enc Vitals Group     BP      Pulse      Resp      Temp      Temp src      SpO2      Weight 147 lb (66.7 kg)     Height '5\' 4"'  (1.626 m)     Head Circumference      Peak Flow      Pain Score 6     Pain Loc      Pain Edu?      Excl. in [place]?    No data found.  Updated Vital Signs Ht '5\' 4"'  (1.626 m)   Wt 66.7 kg   BMI 25.23 kg/m   Visual Acuity Right Eye Distance:   Left Eye Distance:   Bilateral Distance:    Right Eye Near:   Left Eye Near:    Bilateral Near:     Physical Exam Nursing notes and Vital Signs reviewed. Appearance:  Patient appears stated age, and in no acute distress Eyes:  Pupils are equal, round, and reactive to light and accomodation.  Extraocular movement is intact.  Conjunctivae are not inflamed  Ears:  Canals normal.  Tympanic membranes normal.  Nose:  Mildly congested turbinates.  No sinus tenderness.   Mouth/Pharynx:  Normal; no erythema or white plaques. Neck:  Supple.  Enlarged lateral nodes are present, tender to palpation on the left.   Lungs:  Clear to auscultation.  Breath sounds are equal.  Moving air well. Heart:  Regular rate and rhythm without murmurs, rubs, or gallops.  Abdomen:  Nontender without masses or hepatosplenomegaly.  Bowel sounds are present.  No CVA or flank tenderness.  Extremities:  No edema.  Skin:  No rash present.   UC Treatments / Results  Labs (all labs ordered are listed, but only abnormal results are displayed) Labs Reviewed  SARS-COV-2 RNA,(COVID-19) QUALITATIVE NAAT  POC SARS CORONAVIRUS 2 AG -  ED negative    EKG   Radiology No  results found.  Procedures Procedures (including critical care time)  Medications Ordered in UC Medications - No data to display  Initial Impression / Assessment and Plan / UC Course  I have reviewed the triage vital signs and the nursing notes.  Pertinent labs & imaging results that were available during my care of the patient were reviewed by me and considered in my medical decision making (see chart for details).    There is no evidence of bacterial infection today.  Treat symptomatically for now. COVID19 send out   Final Clinical Impressions(s) / UC Diagnoses   Final diagnoses:  Other fatigue  Viral URI with cough     Discharge Instructions     Take plain guaifenesin (1229m extended release tabs such as Mucinex) twice daily, with plenty of water, for cough and congestion.  Get adequate rest.   May use Afrin nasal spray (or generic oxymetazoline) each morning for about 5 days and then discontinue.  Also recommend using saline nasal spray several times daily and saline nasal irrigation (AYR is a common brand).  Use Flonase nasal spray each morning after using Afrin nasal spray and saline nasal irrigation. Try warm salt water gargles for sore throat.  Stop all antihistamines for now, and other non-prescription cough/cold preparations. May take Delsym Cough Suppressant at bedtime for nighttime cough.   If symptoms become significantly worse during the night or over the weekend, proceed to the local emergency room.   If COVID-19 test is positive, isolate yourself until the below conditions are met: 1)  At least 7 days since symptoms onset. AND 2)  > 72 hours after symptom resolution (absence of fever without the use of fever-reducing medicine, and improvement in respiratory symptoms.        ED Prescriptions    None        BKandra Nicolas MD 07/12/19 [phone]

## 2019-07-08 NOTE — Telephone Encounter (Signed)
Please call patient.  On ABX that will treat UTI. Can go to UC for same day testing.  Can go to any testing location for 3-4 day testing.

## 2019-07-08 NOTE — ED Triage Notes (Signed)
Bad headache x 1 week, tongue feels like its changing, it hurts, chest pressure, cough x 2 days, feeling weak

## 2019-07-08 NOTE — Addendum Note (Signed)
Addended by: [name] on: 07/08/2019 12:36 PM   Modules accepted: Orders

## 2019-07-08 NOTE — Telephone Encounter (Signed)
Patient advised.

## 2019-07-08 NOTE — Telephone Encounter (Signed)
Patient advised to go to the Urgent Care for rapid testing.

## 2019-07-08 NOTE — ED Notes (Signed)
After I swabbed patient for Rapid Covid she told Nana at front desk she would be waiting in her car for results. When her test was negative I called her and told her I needed her to come back inside for a swab to send out to the lab for confirmation. She advised me that she was st the store and was going to get some chicken noodle soup and would be back soon. I told her she was not supposed to leave the premises I needed her to in the next two minutes and if that wasn't possible she would have to check back in and wait her turn. I discharged her AMA, she called and spoke to [place] and tried to make an appointment but due to lack of staff we are not accepting any more appointments today, its all walk-in, one provider, one CMA

## 2019-07-12 ENCOUNTER — Encounter: Payer: Self-pay | Admitting: Physician Assistant

## 2019-07-12 NOTE — Discharge Instructions (Addendum)
Take plain guaifenesin (1200mg extended release tabs such as Mucinex) twice daily, with plenty of water, for cough and congestion. Get adequate rest.   °May use Afrin nasal spray (or generic oxymetazoline) each morning for about 5 days and then discontinue.  Also recommend using saline nasal spray several times daily and saline nasal irrigation (AYR is a common brand).  Use Flonase nasal spray each morning after using Afrin nasal spray and saline nasal irrigation. °Try warm salt water gargles for sore throat.  °Stop all antihistamines for now, and other non-prescription cough/cold preparations. °May take Delsym Cough Suppressant at bedtime for nighttime cough.  °  °If symptoms become significantly worse during the night or over the weekend, proceed to the local emergency room.  ° °If COVID-19 test is positive, isolate yourself until the below conditions are met: °1)  At least 7 days since symptoms onset. °AND °2)  > 72 hours after symptom resolution (absence of fever without the use of fever-reducing medicine, and improvement in respiratory symptoms. ° °   °

## 2019-07-13 ENCOUNTER — Encounter: Payer: Self-pay | Admitting: Physician Assistant

## 2019-07-13 ENCOUNTER — Other Ambulatory Visit: Payer: Self-pay

## 2019-07-13 ENCOUNTER — Ambulatory Visit (INDEPENDENT_AMBULATORY_CARE_PROVIDER_SITE_OTHER): Payer: Medicare Other | Admitting: Physician Assistant

## 2019-07-13 ENCOUNTER — Telehealth: Payer: Self-pay | Admitting: Physician Assistant

## 2019-07-13 VITALS — BP 125/70 | HR 84 | Ht 64.0 in | Wt 149.0 lb

## 2019-07-13 DIAGNOSIS — F19939 Other psychoactive substance use, unspecified with withdrawal, unspecified: Secondary | ICD-10-CM | POA: Diagnosis not present

## 2019-07-13 DIAGNOSIS — E538 Deficiency of other specified B group vitamins: Secondary | ICD-10-CM

## 2019-07-13 DIAGNOSIS — N898 Other specified noninflammatory disorders of vagina: Secondary | ICD-10-CM

## 2019-07-13 DIAGNOSIS — R350 Frequency of micturition: Secondary | ICD-10-CM | POA: Diagnosis not present

## 2019-07-13 DIAGNOSIS — R1013 Epigastric pain: Secondary | ICD-10-CM

## 2019-07-13 DIAGNOSIS — Z1329 Encounter for screening for other suspected endocrine disorder: Secondary | ICD-10-CM

## 2019-07-13 DIAGNOSIS — Z13220 Encounter for screening for lipoid disorders: Secondary | ICD-10-CM

## 2019-07-13 DIAGNOSIS — Z638 Other specified problems related to primary support group: Secondary | ICD-10-CM

## 2019-07-13 DIAGNOSIS — Z79899 Other long term (current) drug therapy: Secondary | ICD-10-CM

## 2019-07-13 DIAGNOSIS — Z5320 Procedure and treatment not carried out because of patient's decision for unspecified reasons: Secondary | ICD-10-CM

## 2019-07-13 DIAGNOSIS — K2900 Acute gastritis without bleeding: Secondary | ICD-10-CM

## 2019-07-13 LAB — POCT URINALYSIS DIP (CLINITEK)
Bilirubin, UA: NEGATIVE
Blood, UA: NEGATIVE
Glucose, UA: NEGATIVE mg/dL
Ketones, POC UA: NEGATIVE mg/dL
Leukocytes, UA: NEGATIVE
Nitrite, UA: NEGATIVE
POC PROTEIN,UA: NEGATIVE
Spec Grav, UA: 1.01 (ref 1.010–1.025)
Urobilinogen, UA: 0.2 E.U./dL
pH, UA: 6 (ref 5.0–8.0)

## 2019-07-13 MED ORDER — FLUCONAZOLE 150 MG PO TABS: 150.0000 mg | ORAL_TABLET | Freq: Once | ORAL | 0 refills | Status: AC

## 2019-07-13 MED ORDER — CITALOPRAM HYDROBROMIDE 10 MG PO TABS: 10.0000 mg | ORAL_TABLET | Freq: Every day | ORAL | 1 refills | Status: DC
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
## 2019-07-13 NOTE — Telephone Encounter (Signed)
When pt was check ing out, she stated that she wanted   bloodwork for everything -  B-12, Cholesterol, HIV, hepatitis, etc.Thanks.

## 2019-07-13 NOTE — Telephone Encounter (Signed)
Ok to schedule for 40 minute patient at 10:50

## 2019-07-13 NOTE — Patient Instructions (Addendum)
Finish augmentin.  Restart gabapentin.  Start celexa.  2 week follow up.    Gastritis, Adult  Gastritis is swelling (inflammation) of the stomach. Gastritis can develop quickly (acute). It can also develop slowly over time (chronic). It is important to get help for this condition. If you do not get help, your stomach can bleed, and you can get sores (ulcers) in your stomach. What are the causes? This condition may be caused by:  Germs that get to your stomach.  Drinking too much alcohol.  Medicines you are taking.  Too much acid in the stomach.  A disease of the intestines or stomach.  Stress.  An allergic reaction.  Crohn's disease.  Some cancer treatments (radiation). Sometimes the cause of this condition is not known. What are the signs or symptoms? Symptoms of this condition include:  Pain in your stomach.  A burning feeling in your stomach.  Feeling sick to your stomach (nauseous).  Throwing up (vomiting).  Feeling too full after you eat.  Weight loss.  Bad breath.  Throwing up blood.  Blood in your poop (stool). How is this diagnosed? This condition may be diagnosed with:  Your medical history and symptoms.  A physical exam.  Tests. These can include: ? Blood tests. ? Stool tests. ? A procedure to look inside your stomach (upper endoscopy). ? A test in which a sample of tissue is taken for testing (biopsy). How is this treated? Treatment for this condition depends on what caused it. You may be given:  Antibiotic medicine, if your condition was caused by germs.  H2 blockers and similar medicines, if your condition was caused by too much acid. Follow these instructions at home: Medicines  Take over-the-counter and prescription medicines only as told by your doctor.  If you were prescribed an antibiotic medicine, take it as told by your doctor. Do not stop taking it even if you start to feel better. Eating and drinking   Eat small meals  often, instead of large meals.  Avoid foods and drinks that make your symptoms worse.  Drink enough fluid to keep your pee (urine) pale yellow. Alcohol use  Do not drink alcohol if: ? Your doctor tells you not to drink. ? You are pregnant, may be pregnant, or are planning to become pregnant.  If you drink alcohol: ? Limit your use to:  0-1 drink a day for women.  0-2 drinks a day for men. ? Be aware of how much alcohol is in your drink. In the [place] one drink equals one 12 oz bottle of beer (355 mL), one 5 oz glass of wine (148 mL), or one 1 oz glass of hard liquor (44 mL). General instructions  Talk with your doctor about ways to manage stress. You can exercise or do deep breathing, meditation, or yoga.  Do not smoke or use products that have nicotine or tobacco. If you need help quitting, ask your doctor.  Keep all follow-up visits as told by your doctor. This is important. Contact a doctor if:  Your symptoms get worse.  Your symptoms go away and then come back. Get help right away if:  You throw up blood or something that looks like coffee grounds.  You have black or dark red poop.  You throw up any time you try to drink fluids.  Your stomach pain gets worse.  You have a fever.  You do not feel better after one week. Summary  Gastritis is swelling (inflammation) of the stomach.  You must get help for this condition. If you do not get help, your stomach can bleed, and you can get sores (ulcers).  This condition is diagnosed with medical history, physical exam, or tests.  You can be treated with medicines for germs or medicines to block too much acid in your stomach. This information is not intended to replace advice given to you by your health care provider. Make sure you discuss any questions you have with your health care provider. Document Released: 01/16/2008 Document Revised: 12/17/2017 Document Reviewed: 12/17/2017 Elsevier Patient Education  2020  [employer].

## 2019-07-14 ENCOUNTER — Encounter: Payer: Self-pay | Admitting: Physician Assistant

## 2019-07-14 DIAGNOSIS — K2900 Acute gastritis without bleeding: Secondary | ICD-10-CM | POA: Insufficient documentation

## 2019-07-14 NOTE — Telephone Encounter (Signed)
Pended Lipid panel, HIV, and B12. Unsure which hepatitis we would need to order or any other lab work?

## 2019-07-14 NOTE — Telephone Encounter (Signed)
You do not need hepatitis labs unless liver enzymes are elevated. We are checking enzymes. Labs printed.

## 2019-07-15 ENCOUNTER — Encounter: Payer: Self-pay | Admitting: Physician Assistant

## 2019-07-15 DIAGNOSIS — Z638 Other specified problems related to primary support group: Secondary | ICD-10-CM | POA: Insufficient documentation

## 2019-07-15 HISTORY — DX: Other specified problems related to primary support group: Z63.8

## 2019-07-15 NOTE — Telephone Encounter (Signed)
Patient made aware.

## 2019-07-16 ENCOUNTER — Other Ambulatory Visit: Payer: Self-pay

## 2019-07-16 ENCOUNTER — Telehealth: Payer: Self-pay | Admitting: Neurology

## 2019-07-16 ENCOUNTER — Encounter: Payer: Self-pay | Admitting: Physician Assistant

## 2019-07-16 ENCOUNTER — Ambulatory Visit (INDEPENDENT_AMBULATORY_CARE_PROVIDER_SITE_OTHER): Payer: Medicare Other | Admitting: *Deleted

## 2019-07-16 DIAGNOSIS — E538 Deficiency of other specified B group vitamins: Secondary | ICD-10-CM

## 2019-07-16 NOTE — Telephone Encounter (Signed)
Not sure what prescription she is talking about?

## 2019-07-16 NOTE — Telephone Encounter (Signed)
[facility] and spoke with pharmacist. They state syringes come with vaginal cream and can not supplement with extra syringes, as they are specific to product. Normal to have extra medication in case some gets spilled, etc. No other dosages to get more syringes.

## 2019-07-16 NOTE — Progress Notes (Signed)
Pt here to have her blood drawn. Blood taken from R arm. She tolerated well. (2 sst).Jamie [name], [place]

## 2019-07-17 LAB — COMPLETE METABOLIC PANEL WITH GFR
AG Ratio: 1.9 (calc) (ref 1.0–2.5)
ALT: 15 U/L (ref 6–29)
AST: 20 U/L (ref 10–35)
Albumin: 4.6 g/dL (ref 3.6–5.1)
Alkaline phosphatase (APISO): 47 U/L (ref 37–153)
BUN: 16 mg/dL (ref 7–25)
CO2: 28 mmol/L (ref 20–32)
Calcium: 9.7 mg/dL (ref 8.6–10.4)
Chloride: 102 mmol/L (ref 98–110)
Creat: 0.83 mg/dL (ref 0.50–0.99)
GFR, Est African American: 85 mL/min/{1.73_m2} (ref >=60)
GFR, Est Non African American: 73 mL/min/{1.73_m2} (ref >=60)
Globulin: 2.4 g/dL (calc) (ref 1.9–3.7)
Glucose, Bld: 94 mg/dL (ref 65–99)
Potassium: 4.6 mmol/L (ref 3.5–5.3)
Sodium: 139 mmol/L (ref 135–146)
Total Bilirubin: 0.6 mg/dL (ref 0.2–1.2)
Total Protein: 7 g/dL (ref 6.1–8.1)

## 2019-07-17 LAB — LIPID PANEL
Cholesterol: 246 mg/dL — ABNORMAL HIGH (ref <200)
HDL: 67 mg/dL (ref >=50)
LDL Cholesterol (Calc): 153 mg/dL (calc) — ABNORMAL HIGH
Non-HDL Cholesterol (Calc): 179 mg/dL (calc) — ABNORMAL HIGH (ref <130)
Total CHOL/HDL Ratio: 3.7 (calc) (ref <5.0)
Triglycerides: 139 mg/dL (ref <150)

## 2019-07-17 LAB — HIV ANTIBODY (ROUTINE TESTING W REFLEX): HIV 1&2 Ab, 4th Generation: NONREACTIVE

## 2019-07-17 LAB — VITAMIN B12: Vitamin B-12: 286 pg/mL (ref 200–1100)

## 2019-07-17 LAB — TSH: TSH: 3.19 mIU/L (ref 0.40–4.50)

## 2019-07-17 MED ORDER — ATORVASTATIN CALCIUM 20 MG PO TABS: 20.0000 mg | ORAL_TABLET | Freq: Every day | ORAL | 3 refills | Status: DC

## 2019-07-17 MED ORDER — LEVOTHYROXINE SODIUM 50 MCG PO TABS: 50.0000 ug | ORAL_TABLET | Freq: Every day | ORAL | 1 refills | Status: DC

## 2019-07-17 NOTE — Telephone Encounter (Signed)
Jamie [name],   Thyroid in normal range but optimal is 1-2. You are at 3. It could help you feel a little better to be supplemented some.  Kidney, liver, glucose look good.  b12 is still low. You would benefit from monthly shots. I know you have had these intermittently but regularly keeping b12 up could help you feel better.  Cholesterol is up some.  HDL(good) is still good.  LDL(bad) is up from 1 year ago.  Starting a cholesterol reducing agent can help to lower cardiovascular risk. Would you like to consider?   -[name]

## 2019-07-17 NOTE — Telephone Encounter (Signed)
HIV negative as well.

## 2019-07-17 NOTE — Telephone Encounter (Signed)
We can do the b12 injection less often like every other month.  Increase thyroid med.  Sent lipitor.  Recheck TSH in 6 weeks.

## 2019-07-17 NOTE — Addendum Note (Signed)
Addended by: [name] on: 07/17/2019 04:40 PM   Modules accepted: Orders

## 2019-07-20 ENCOUNTER — Encounter: Payer: Self-pay | Admitting: Physician Assistant

## 2019-07-20 ENCOUNTER — Other Ambulatory Visit: Payer: Self-pay | Admitting: Neurology

## 2019-07-20 DIAGNOSIS — Z1329 Encounter for screening for other suspected endocrine disorder: Secondary | ICD-10-CM

## 2019-07-20 DIAGNOSIS — E039 Hypothyroidism, unspecified: Secondary | ICD-10-CM

## 2019-07-20 DIAGNOSIS — E038 Other specified hypothyroidism: Secondary | ICD-10-CM

## 2019-07-20 MED ORDER — LEVOTHYROXINE SODIUM 50 MCG PO TABS: 50.0000 ug | ORAL_TABLET | Freq: Every day | ORAL | 0 refills | Status: DC

## 2019-07-21 ENCOUNTER — Encounter: Payer: Self-pay | Admitting: Physician Assistant

## 2019-07-21 NOTE — Telephone Encounter (Signed)
Camie Patience called [facility] and they scheduled [facility] with Carl Best PA on 07/28/19 at 9:30. I sent dnylah mathes message to let her know. - CF

## 2019-07-21 NOTE — Telephone Encounter (Signed)
Can we get her in with Dr. Ardis Hughs group as soon as possible. She is scheduled with him in January but having more GI symptoms.

## 2019-07-21 NOTE — Telephone Encounter (Signed)
Call patient change appt to after she sees GI.

## 2019-07-22 NOTE — Telephone Encounter (Signed)
Patient said she will call us back to make this appointment. No further questions at this time.

## 2019-07-24 ENCOUNTER — Encounter: Payer: Self-pay | Admitting: Physician Assistant

## 2019-07-28 ENCOUNTER — Ambulatory Visit: Payer: Medicare Other

## 2019-07-28 ENCOUNTER — Ambulatory Visit (INDEPENDENT_AMBULATORY_CARE_PROVIDER_SITE_OTHER): Payer: Medicare Other | Admitting: Nurse Practitioner

## 2019-07-28 ENCOUNTER — Encounter: Payer: Self-pay | Admitting: Nurse Practitioner

## 2019-07-28 ENCOUNTER — Other Ambulatory Visit: Payer: Medicare Other

## 2019-07-28 ENCOUNTER — Other Ambulatory Visit (INDEPENDENT_AMBULATORY_CARE_PROVIDER_SITE_OTHER): Payer: Medicare Other

## 2019-07-28 ENCOUNTER — Other Ambulatory Visit: Payer: Self-pay

## 2019-07-28 VITALS — BP 104/72 | HR 83 | Temp 98.7°F | Ht 64.0 in | Wt 147.0 lb

## 2019-07-28 DIAGNOSIS — R1310 Dysphagia, unspecified: Secondary | ICD-10-CM

## 2019-07-28 DIAGNOSIS — R140 Abdominal distension (gaseous): Secondary | ICD-10-CM | POA: Diagnosis not present

## 2019-07-28 DIAGNOSIS — R1084 Generalized abdominal pain: Secondary | ICD-10-CM | POA: Diagnosis not present

## 2019-07-28 DIAGNOSIS — Z1159 Encounter for screening for other viral diseases: Secondary | ICD-10-CM

## 2019-07-28 LAB — CBC WITH DIFFERENTIAL/PLATELET
Basophils Absolute: 0 10*3/uL (ref 0.0–0.1)
Basophils Relative: 1 % (ref 0.0–3.0)
Eosinophils Absolute: 0 10*3/uL (ref 0.0–0.7)
Eosinophils Relative: 0.7 % (ref 0.0–5.0)
HCT: 40 % (ref 36.0–46.0)
Hemoglobin: 13.5 g/dL (ref 12.0–15.0)
Lymphocytes Relative: 23.5 % (ref 12.0–46.0)
Lymphs Abs: 1.1 10*3/uL (ref 0.7–4.0)
MCHC: 33.8 g/dL (ref 30.0–36.0)
MCV: 90 fl (ref 78.0–100.0)
Monocytes Absolute: 0.5 10*3/uL (ref 0.1–1.0)
Monocytes Relative: 9.9 % (ref 3.0–12.0)
Neutro Abs: 3.1 10*3/uL (ref 1.4–7.7)
Neutrophils Relative %: 64.9 % (ref 43.0–77.0)
Platelets: 303 10*3/uL (ref 150.0–400.0)
RBC: 4.45 Mil/uL (ref 3.87–5.11)
RDW: 12.1 % (ref 11.5–15.5)
WBC: 4.7 10*3/uL (ref 4.0–10.5)

## 2019-07-28 LAB — C-REACTIVE PROTEIN: CRP: <1 mg/dL (ref 0.5–20.0)

## 2019-07-28 NOTE — Progress Notes (Signed)
07/28/2019 Jamie [name] [id number] January 24, 1951   History of Present Illness: Jamie [name] is a 68 year old female with a past medical history of anxiety, depression, mild carotid artery disease, hyperlipidemia, diverticulitis 12/2013, diverticulitis with a microperforation and small abscess 06/2015 and IBS. Past hysterectomy. She presents today with variable GI symptoms. One month ago, she had significant nauseas associated with a head ache. She then developed nonbloody diarrhea for 3 days with the development of a cough. COVID 19 test at the urgent care 07/08/2019 was negative. She then developed constipation. She has intermittent burning discomfort that goes down the right and left sides of her abdomen to her groin area. She also noted having burning to her tongue. The burning pain is somewhat constant.  She was seen by her PCP [name] PA-C and she was prescribed Augmentin 875mg  po bid x 10 days for possible diverticulitis 07/06/2019 and Carafate for gastritis. No further diarrhea. She went a few days without passing a BM. She passed a small amount of solid stool 2 to 3 days ago. No rectal bleeding or melena. She infrequently takes Linzess. She stopped drinking coffee without change in her symptoms. She drank 2 cups of coffee yesterday without distress. She is now having dysphagia for the past 2 1/2 weeks. Food or pills are now getting stuck to the mid esophagus which occurs every time she eats. The stuck food passes down the esophagus within a few minutes. She was prescribed Pantoprazole 40mg  QD by her PCP which she has not yet started. A H. Pylori  Breath test was ordered and she intends to start the Pantoprazole after she completes the breath tests. She has abdominal bloat. No fever, sweats or chills. She stated weighing 180lbs 2 years ago and now weighs 147lbs. She reports eating a healy diet, she did not intentionally lose weight.  History of a 35mm left lung nodule per CT in 2018. A follow  up chest CT has not been done. Abd/pelvic CT in 2018 showed diverticular disease. She underwent an EGD and colonoscopy in 2018. The EGD was normal. The colonoscopy was in 2018 which showed diverticulosis, no polyps. Next colonoscopy due in 2028. Ferrous Sulfate 325mg  was on her medication list. She stated she is no longer taking iron. She took Ferrous Sulfate 1 year ago for anemia. In review of her labs 12/18/17 she had mild anemia with Hg 11.9. HCT 35.2. MCV 88.7. Repeat labs 01/13/2018 Hg 13. Normal iron levels 2016-2019. Normal Hg since then.   Past Medical History:  Diagnosis Date  . Anxiety   . Depression   . Diverticulitis   . Diverticulosis   . Headache   . Hyperlipidemia   . IBS (irritable bowel syndrome)   . Lung mass   . Syncope 03/08/2015   Past Surgical History:  Procedure Laterality Date  . ABDOMINAL HYSTERECTOMY    . HEMORRHOID SURGERY    . TUBAL LIGATION    . TUMOR REMOVAL     Left thigh    Current Outpatient Medications on File Prior to Visit  Medication Sig Dispense Refill  . citalopram (CELEXA) 10 MG tablet Take 1 tablet (10 mg total) by mouth daily. 30 tablet 1  . cyclobenzaprine (FLEXERIL) 5 MG tablet Take 1 tablet (5 mg total) by mouth 3 (three) times daily as needed for muscle spasms. 30 tablet 1  . gabapentin (NEURONTIN) 300 MG capsule Take 1 capsule (300 mg total) by mouth 2 (two) times daily. 180 capsule 1  .  levothyroxine (SYNTHROID) 50 MCG tablet Take 1 tablet (50 mcg total) by mouth daily. 90 tablet 0  . linaclotide (LINZESS) 290 MCG CAPS capsule Take 1 capsule (290 mcg total) by mouth daily. 90 capsule 3  . NON FORMULARY Take 1 capsule by mouth 3 (three) times daily. Herbal supplements for gastritis    . pantoprazole (PROTONIX) 40 MG tablet Take 1 tablet (40 mg total) by mouth daily. 90 tablet 3  . Vitamin D, Ergocalciferol, (DRISDOL) 1.25 MG (50000 UT) CAPS capsule TAKE 1 CAPSULE BY MOUTH 1 TIME A WEEK 12 capsule 4  . ALPRAZolam (XANAX) 0.5 MG tablet Take one  tablet as needed up to  twice a day. 60 tablet 2   No current facility-administered medications on file prior to visit.   Allergies  Allergen Reactions  . Iodine Rash  . Ciprofloxacin     Facial numbness  . Iodinated Diagnostic Agents Other (See Comments)    Face and chest felt "hot", throat felt tight.   . Peanut Butter Flavor Nausea And Vomiting    N/V  . Shellfish Allergy Swelling    EGD 03/20/2017 by Dr. Ardis Hughs: - Normal esophagus. - Gastritis. Biopsied. - Normal examined duodenum.  Colonoscopy 03/20/2017 by Dr. Ardis Hughs: - Moderate diverticulosis in the left colon. - The examination was otherwise normal on direct and retroflexion views. - No specimens collected. - Repeat colonoscopy in 10 years.  Abdominal/pelvic CT without contrast 02/04/2017: Prominent colonic diverticula most notable sigmoid colon with significant muscular hypertrophy. No extraluminal bowel inflammatory process, free fluid or free air. Limited for evaluating for colonic mass given the muscular hypertrophy and under distension. No inflammation surrounds the appendix. Chronic right ureteral pelvic junction obstruction. No renal or ureteral obstructing stone. Post hysterectomy. Small amount of gas in the vaginal vault which is immediately adjacent to noninflamed appearing sigmoid diverticula. No fistula tract identified. L5-S1 moderate disc degeneration  Chest CT 11/13/2016: Stable 13 mm left upper lobe pulmonary nodule. Suggest continued followup by chest CT in 12 months.  Physical Exam: BP 104/72 (BP Location: Right Arm, Patient Position: Sitting, Cuff Size: Normal)   Pulse 83   Temp 98.7 F (37.1 C) (Oral)   Ht 5\' 4"  (1.626 m)   Wt 147 lb (66.7 kg)   BMI 25.23 kg/m  General: Well developed 68 year old female in no acute distress Head: Normocephalic and atraumatic Eyes:  Sclerae anicteric, conjunctiva pink  Ears: Normal auditory acuity Lungs: Clear throughout to auscultation Heart:  Regular rate and rhythm Abdomen: Soft, non tender and non distended. No masses, no hepatomegaly. Normal bowel sounds x 4 quads. Rectal: Deferred Musculoskeletal: Symmetrical with no gross deformities  Extremities: No edema  Neurological: Alert oriented x 4, grossly nonfocal Psychological:  Alert and cooperative. Normal mood and affect  Assessment and Recommendations:  64. 68 year old female with new onset dysphagia which occurred after taking Augmentin, possible candidiasis esophagitis. -EGD to rule out reflux vs candidiasis esophagitis vs malignant process,  benefits and risks discussed including risk with sedation, risk of bleeding, perforation and infection -She will proceed with H. Pylori breath test as ordered by her PCP then start Pantoprazole 40mg  QD -If her dysphagia worsens prior to her EGD then Nystatin po will be ordered for possible esophageal candidiasis  -Follow up in the office 2 to 3 weeks after EGD completed   2. Generalized neuropathic type abdominal pain doubt diverticulitis  -check CBC -repeat abd/pelvic CT if abdominal pain worsens or if WBC/CRP elevated -IBGARD 1 po bid  3. Constipation and abdominal bloat -Florastor probiotic 1 po bid -Linzess 262mcg one po QD PRN  4. Unexplained weight loss over the past 2 years, with stable weight 147 lbs for at least the past 6 months -EGD as ordered  -Next colonoscopy due in 2028, last colonoscopy in 2018 showed diverticular dz, no polyps.  No plans for a repeat colonoscopy at this time -Consider repeat abd/pelvic CT -Hx of a 88mm left lung nodule, instructed patient to contact her PCP to further discuss repeat chest CT

## 2019-07-28 NOTE — Patient Instructions (Addendum)
If you are age 66 or older, your body mass index should be between 23-30. Your Body mass index is 25.23 kg/m. If this is out of the aforementioned range listed, please consider follow up with your Primary Care Provider.  If you are age 71 or younger, your body mass index should be between 19-25. Your Body mass index is 25.23 kg/m. If this is out of the aformentioned range listed, please consider follow up with your Primary Care Provider.   Your provider has requested that you go to the basement level for lab work before leaving today. Press "B" on the elevator. The lab is located at the first door on the left as you exit the elevator.  You have been scheduled for an endoscopy. Please follow written instructions given to you at your visit today. If you use inhalers (even only as needed), please bring them with you on the day of your procedure.  1. Florastor probiotic twice daily for 2-4 weeks. 2. IBguard (peppermint oil) twice daily as needed. 3. Restart Pantoprazole after H. Pylori breath test.  4. Call if symptoms get worse. Follow up in the office 2-3 weeks after endoscopy. 5. Follow up with PCP regarding left lung nodule

## 2019-07-29 ENCOUNTER — Encounter: Payer: Self-pay | Admitting: Physician Assistant

## 2019-07-29 NOTE — Telephone Encounter (Signed)
I have no clue what she is talking about. I know GI was going to schedule EGD maybe that?

## 2019-07-29 NOTE — Progress Notes (Signed)
I agree with the above note, plan 

## 2019-07-30 ENCOUNTER — Ambulatory Visit (INDEPENDENT_AMBULATORY_CARE_PROVIDER_SITE_OTHER): Payer: Medicare Other

## 2019-07-30 DIAGNOSIS — Z1159 Encounter for screening for other viral diseases: Secondary | ICD-10-CM

## 2019-07-30 LAB — SARS CORONAVIRUS 2 (TAT 6-24 HRS): SARS Coronavirus 2: NEGATIVE

## 2019-08-03 ENCOUNTER — Other Ambulatory Visit: Payer: Self-pay | Admitting: Physician Assistant

## 2019-08-03 ENCOUNTER — Encounter: Payer: Self-pay | Admitting: Gastroenterology

## 2019-08-03 ENCOUNTER — Other Ambulatory Visit: Payer: Self-pay

## 2019-08-03 ENCOUNTER — Ambulatory Visit (AMBULATORY_SURGERY_CENTER): Payer: Medicare Other | Admitting: Gastroenterology

## 2019-08-03 VITALS — BP 140/84 | HR 91 | Temp 98.9°F | Resp 15 | Ht 64.0 in | Wt 147.0 lb

## 2019-08-03 DIAGNOSIS — R1319 Other dysphagia: Secondary | ICD-10-CM | POA: Diagnosis not present

## 2019-08-03 DIAGNOSIS — K2970 Gastritis, unspecified, without bleeding: Secondary | ICD-10-CM | POA: Diagnosis not present

## 2019-08-03 DIAGNOSIS — R1013 Epigastric pain: Secondary | ICD-10-CM

## 2019-08-03 DIAGNOSIS — K2990 Gastroduodenitis, unspecified, without bleeding: Secondary | ICD-10-CM

## 2019-08-03 DIAGNOSIS — K2950 Unspecified chronic gastritis without bleeding: Secondary | ICD-10-CM | POA: Diagnosis not present

## 2019-08-03 DIAGNOSIS — R109 Unspecified abdominal pain: Secondary | ICD-10-CM

## 2019-08-03 MED ORDER — SODIUM CHLORIDE 0.9 % IV SOLN: 500.0000 mL | Freq: Once | INTRAVENOUS | Status: DC

## 2019-08-03 NOTE — Progress Notes (Signed)
Temp-lc  V/s-cw 

## 2019-08-03 NOTE — Progress Notes (Signed)
Called to room to assist during endoscopic procedure.  Patient ID and intended procedure confirmed with present staff. Received instructions for my participation in the procedure from the performing physician.  

## 2019-08-03 NOTE — Op Note (Signed)
[place] Patient Name: Jamie [name] Procedure Date: 08/03/2019 2:06 PM MRN: [id number] Endoscopist: Milus Banister , MD Age: 68 Referring MD:  Date of Birth: July 29, 1951 Gender: Female Account #: [id number] Procedure:                Upper GI endoscopy Indications:              Generalized abdominal pain, bloating, Dysphagia Medicines:                Monitored Anesthesia Care Procedure:                Pre-Anesthesia Assessment:                           - Prior to the procedure, a History and Physical                            was performed, and patient medications and                            allergies were reviewed. The patient's tolerance of                            previous anesthesia was also reviewed. The risks                            and benefits of the procedure and the sedation                            options and risks were discussed with the patient.                            All questions were answered, and informed consent                            was obtained. Prior Anticoagulants: The patient has                            taken no previous anticoagulant or antiplatelet                            agents. ASA Grade Assessment: II - A patient with                            mild systemic disease. After reviewing the risks                            and benefits, the patient was deemed in                            satisfactory condition to undergo the procedure.                           After obtaining informed consent, the endoscope was  passed under direct vision. Throughout the                            procedure, the patient's blood pressure, pulse, and                            oxygen saturations were monitored continuously. The                            Endoscope was introduced through the mouth, and                            advanced to the second part of duodenum. The upper                            GI  endoscopy was accomplished without difficulty.                            The patient tolerated the procedure well. Scope In: Scope Out: Findings:                 Minimal inflammation characterized by erythema was                            found in the gastric body and in the gastric                            antrum. Biopsies were taken with a cold forceps for                            histology.                           The exam was otherwise without abnormality. Complications:            No immediate complications. Estimated blood loss:                            None. Estimated Blood Loss:     Estimated blood loss: none. Impression:               - Very mild gastritis, biopsied to check for H.                            pylori.                           - The examination was otherwise normal. Recommendation:           - Patient has a contact number available for                            emergencies. The signs and symptoms of potential                            delayed complications were discussed with the  patient. Return to normal activities tomorrow.                            Written discharge instructions were provided to the                            patient.                           - Resume previous diet.                           - Continue present medications.                           - Await pathology results. IF the biopsies do NOT                            show H. pylori then will consider further testing                            with CT scan abdomen and pelvis. Milus Banister, MD 08/03/2019 2:27:14 PM This report has been signed electronically.

## 2019-08-03 NOTE — Patient Instructions (Signed)
YOU HAD AN ENDOSCOPIC PROCEDURE TODAY AT THE [facility]:   Refer to the procedure report that was given to you for any specific questions about what was found during the examination.  If the procedure report does not answer your questions, please call your gastroenterologist to clarify.  If you requested that your care partner not be given the details of your procedure findings, then the procedure report has been included in a sealed envelope for you to review at your convenience later.  YOU SHOULD EXPECT: Some feelings of bloating in the abdomen. Passage of more gas than usual.  Walking can help get rid of the air that was put into your GI tract during the procedure and reduce the bloating. If you had a lower endoscopy (such as a colonoscopy or flexible sigmoidoscopy) you may notice spotting of blood in your stool or on the toilet paper. If you underwent a bowel prep for your procedure, you may not have a normal bowel movement for a few days.  Please Note:  You might notice some irritation and congestion in your nose or some drainage.  This is from the oxygen used during your procedure.  There is no need for concern and it should clear up in a day or so.  SYMPTOMS TO REPORT IMMEDIATELY:   Following upper endoscopy (EGD)  Vomiting of blood or coffee ground material  New chest pain or pain under the shoulder blades  Painful or persistently difficult swallowing  New shortness of breath  Fever of 100F or higher  Black, tarry-looking stools  For urgent or emergent issues, a gastroenterologist can be reached at any hour by calling [phone].   DIET:  We do recommend a small meal at first, but then you may proceed to your regular diet.  Drink plenty of fluids but you should avoid alcoholic beverages for 24 hours.  ACTIVITY:  You should plan to take it easy for the rest of today and you should NOT DRIVE or use heavy machinery until tomorrow (because of the sedation medicines used  during the test).    FOLLOW UP: Our staff will call the number listed on your records 48-72 hours following your procedure to check on you and address any questions or concerns that you may have regarding the information given to you following your procedure. If we do not reach you, we will leave a message.  We will attempt to reach you two times.  During this call, we will ask if you have developed any symptoms of COVID 19. If you develop any symptoms (ie: fever, flu-like symptoms, shortness of breath, cough etc.) before then, please call [phone].  If you test positive for Covid 19 in the 2 weeks post procedure, please call and report this information to us.    If any biopsies were taken you will be contacted by phone or by letter within the next 1-3 weeks.  Please call us at [phone] if you have not heard about the biopsies in 3 weeks.    SIGNATURES/CONFIDENTIALITY: You and/or your care partner have signed paperwork which will be entered into your electronic medical record.  These signatures attest to the fact that that the information above on your After Visit Summary has been reviewed and is understood.  Full responsibility of the confidentiality of this discharge information lies with you and/or your care-partner. 

## 2019-08-05 ENCOUNTER — Telehealth: Payer: Self-pay | Admitting: *Deleted

## 2019-08-05 NOTE — Telephone Encounter (Signed)
I agree that it might be from the oxygen.  Could also just be URI related sinus congestion.  Let her know she can try saline flushes of her nostrils 2 or 3 times a day to see if this helps.  Thanks

## 2019-08-05 NOTE — Telephone Encounter (Signed)
  Follow up Call-  Call back number 08/03/2019 03/20/2017  Post procedure Call Back phone  # [phone]  Permission to leave phone message Yes Yes  Some recent data might be hidden     Patient questions:  Do you have a fever, pain , or abdominal swelling? No. Pain Score  0 *  Have you tolerated food without any problems? Yes.    Have you been able to return to your normal activities? Yes.    Do you have any questions about your discharge instructions: Diet   No. Medications  No. Follow up visit  No.  Do you have questions or concerns about your Care? No.  Actions: * If pain score is 4 or above: No action needed, pain <4.  1. Have you developed a fever since your procedure? no  2.   Have you had an respiratory symptoms (SOB or cough) since your procedure? See below  3.   Have you tested positive for COVID 19 since your procedure no  4.   Have you had any family members/close contacts diagnosed with the COVID 19 since your procedure?  No  Dr. Ardis Hughs,  This pt states after her procedure, she had "mucous, a stuffy nose."  No fever.  It was worse yesterday, but better today.  I explained it could have possibly been from the oxygen drying out her sinuses.  Is there anything else you would like me to do?  Thanks, Cyril Mourning     If yes to any of these questions please route to Joylene John, RN and Alphonsa Gin, RN.

## 2019-08-05 NOTE — Telephone Encounter (Signed)
  Follow up Call-  Call back number 08/03/2019 03/20/2017  Post procedure Call Back phone  # [phone]  Permission to leave phone message Yes Yes  Some recent data might be hidden   [facility]

## 2019-08-05 NOTE — Telephone Encounter (Signed)
LMOM with Jamie [name]' recommendations

## 2019-08-09 ENCOUNTER — Other Ambulatory Visit: Payer: Self-pay | Admitting: Physician Assistant

## 2019-08-12 ENCOUNTER — Telehealth: Payer: Self-pay | Admitting: Gastroenterology

## 2019-08-12 ENCOUNTER — Encounter: Payer: Self-pay | Admitting: Physician Assistant

## 2019-08-12 NOTE — Telephone Encounter (Signed)
The pt has been advised that Dr Ardis Hughs will review path when he is back in the office. The pt has been advised of the information and verbalized understanding.

## 2019-08-15 ENCOUNTER — Other Ambulatory Visit: Payer: Self-pay | Admitting: Physician Assistant

## 2019-08-15 DIAGNOSIS — F419 Anxiety disorder, unspecified: Secondary | ICD-10-CM

## 2019-08-15 DIAGNOSIS — B353 Tinea pedis: Secondary | ICD-10-CM

## 2019-08-17 NOTE — Telephone Encounter (Signed)
Xanax - Last filled 05/07/2019 #60 with 2 refills   Clotrimazole cream - no longer on medication list. Last RX 02/23/2019 - 60 g

## 2019-08-17 NOTE — Telephone Encounter (Signed)
Jamie Banister, MD  Timothy Lasso, RN   Cc: Alberteen Sam, RN  Candita Borenstein, please call her.  The biopsies from her stomach were negative for H. Pylori.  She needs a CT scan abd/pelvis to with iV and oral contrast to continue the workup for her generalized abd pain.  Thanks   Megan, no result note is necessary.  Thanks.

## 2019-08-18 NOTE — Telephone Encounter (Signed)
The pt has been advised and states she will call back if she decides to have CT scan.  She is seeing a urologist tomorrow and will follow up as needed.

## 2019-08-20 ENCOUNTER — Encounter: Payer: Self-pay | Admitting: Physician Assistant

## 2019-08-21 ENCOUNTER — Encounter: Payer: Self-pay | Admitting: Physician Assistant

## 2019-08-24 ENCOUNTER — Encounter: Payer: Self-pay | Admitting: Physician Assistant

## 2019-08-24 ENCOUNTER — Other Ambulatory Visit: Payer: Self-pay | Admitting: Neurology

## 2019-08-24 DIAGNOSIS — M4714 Other spondylosis with myelopathy, thoracic region: Secondary | ICD-10-CM

## 2019-08-24 DIAGNOSIS — R531 Weakness: Secondary | ICD-10-CM

## 2019-08-25 ENCOUNTER — Ambulatory Visit (INDEPENDENT_AMBULATORY_CARE_PROVIDER_SITE_OTHER): Payer: Medicare Other

## 2019-08-25 ENCOUNTER — Other Ambulatory Visit: Payer: Self-pay

## 2019-08-25 DIAGNOSIS — R531 Weakness: Secondary | ICD-10-CM

## 2019-08-25 DIAGNOSIS — M4714 Other spondylosis with myelopathy, thoracic region: Secondary | ICD-10-CM | POA: Diagnosis not present

## 2019-08-26 ENCOUNTER — Telehealth: Payer: Self-pay | Admitting: Gastroenterology

## 2019-08-26 ENCOUNTER — Encounter: Payer: Self-pay | Admitting: Physician Assistant

## 2019-08-26 DIAGNOSIS — R109 Unspecified abdominal pain: Secondary | ICD-10-CM

## 2019-08-26 NOTE — Telephone Encounter (Signed)
The pt states she feels like food and liquid is getting stuck when she eats.  She has a full feeling just under her bra line.  She states this is not new.  Also has complaints of bloating, and discomfort in the upper abd.  She is taking pantoprazole 40 mg daily.  Has some burning in the chest when eating.  Per the path note Dr Ardis Hughs recommends CT abd pelvis for generalized abd pain.  Dr Ardis Hughs the pt has an allergy to contrast please advise

## 2019-08-26 NOTE — Telephone Encounter (Signed)
Patient left a vm that she wanted to speak directly with [facility] about imaging. Thinks she needs a CT. Having trouble with eating. She has also contacted GI. [name] - see phone note from GI.

## 2019-08-28 NOTE — Telephone Encounter (Signed)
Left message on machine to call back  

## 2019-08-28 NOTE — Telephone Encounter (Signed)
Can you please ask her exactly what her "allergy "is to IV contrast.  CT scans are generally much more helpful if IV contrast can be given.

## 2019-08-31 ENCOUNTER — Telehealth: Payer: Self-pay | Admitting: Neurology

## 2019-08-31 NOTE — Telephone Encounter (Signed)
Patient left vm asking for MR results to be sent to [facility]. I have forwarded the results to their office per patient request.

## 2019-08-31 NOTE — Telephone Encounter (Signed)
The pt states she has an itch, rash and swelling all over with the CT contrast.  She says she feels like she has a "blood clot" in her small intestine that is preventing her from eating and drinking the volume she has in the past.  Please advise

## 2019-08-31 NOTE — Telephone Encounter (Signed)
I think non-iv contrast CT scan will not be very helpful.  INstead lets order abdominal US for generalized abdominal pains.

## 2019-08-31 NOTE — Telephone Encounter (Signed)
You have been scheduled for an abdominal ultrasound at [facility] (1st floor of hospital) on 09/04/19 at 9 am. Please arrive 15 minutes prior to your appointment for registration. Make certain not to have anything to eat or drink 6 hours prior to your appointment. Should you need to reschedule your appointment, please contact radiology at [phone]. This test typically takes about 30 minutes to perform.   Pt notified via My Chart per her request

## 2019-09-01 ENCOUNTER — Ambulatory Visit: Payer: Medicare Other | Admitting: Gastroenterology

## 2019-09-04 ENCOUNTER — Ambulatory Visit (HOSPITAL_COMMUNITY)
Admission: RE | Admit: 2019-09-04 | Discharge: 2019-09-04 | Disposition: A | Discharge status: Home / Self Care | Payer: Medicare Other | Source: Ambulatory Visit | Attending: Gastroenterology | Admitting: Gastroenterology

## 2019-09-04 ENCOUNTER — Other Ambulatory Visit: Payer: Self-pay

## 2019-09-04 DIAGNOSIS — R109 Unspecified abdominal pain: Secondary | ICD-10-CM

## 2019-09-07 ENCOUNTER — Encounter: Payer: Self-pay | Admitting: Physician Assistant

## 2019-09-07 ENCOUNTER — Telehealth: Payer: Self-pay | Admitting: Physician Assistant

## 2019-09-07 DIAGNOSIS — E038 Other specified hypothyroidism: Secondary | ICD-10-CM

## 2019-09-07 DIAGNOSIS — M8580 Other specified disorders of bone density and structure, unspecified site: Secondary | ICD-10-CM

## 2019-09-07 DIAGNOSIS — D508 Other iron deficiency anemias: Secondary | ICD-10-CM

## 2019-09-07 DIAGNOSIS — E039 Hypothyroidism, unspecified: Secondary | ICD-10-CM

## 2019-09-07 DIAGNOSIS — Z0000 Encounter for general adult medical examination without abnormal findings: Secondary | ICD-10-CM

## 2019-09-07 DIAGNOSIS — E782 Mixed hyperlipidemia: Secondary | ICD-10-CM

## 2019-09-07 DIAGNOSIS — E538 Deficiency of other specified B group vitamins: Secondary | ICD-10-CM

## 2019-09-07 NOTE — Telephone Encounter (Signed)
Labs ordered, please advise if appropriate

## 2019-09-07 NOTE — Telephone Encounter (Signed)
Labs signed

## 2019-09-07 NOTE — Telephone Encounter (Signed)
Patient has a physical appointment with PCP tomorrow and was requesting to have lab orders placed prior to this visit. Please Advise.

## 2019-09-08 ENCOUNTER — Encounter: Payer: Self-pay | Admitting: Physician Assistant

## 2019-09-08 ENCOUNTER — Encounter: Payer: Medicare Other | Admitting: Physician Assistant

## 2019-09-08 DIAGNOSIS — R1084 Generalized abdominal pain: Secondary | ICD-10-CM

## 2019-09-09 ENCOUNTER — Encounter: Payer: Self-pay | Admitting: Physician Assistant

## 2019-09-09 DIAGNOSIS — E038 Other specified hypothyroidism: Secondary | ICD-10-CM

## 2019-09-09 DIAGNOSIS — R000 Tachycardia, unspecified: Secondary | ICD-10-CM

## 2019-09-09 DIAGNOSIS — R1013 Epigastric pain: Secondary | ICD-10-CM

## 2019-09-09 DIAGNOSIS — D510 Vitamin B12 deficiency anemia due to intrinsic factor deficiency: Secondary | ICD-10-CM

## 2019-09-09 DIAGNOSIS — D508 Other iron deficiency anemias: Secondary | ICD-10-CM

## 2019-09-09 DIAGNOSIS — E538 Deficiency of other specified B group vitamins: Secondary | ICD-10-CM

## 2019-09-09 DIAGNOSIS — K5730 Diverticulosis of large intestine without perforation or abscess without bleeding: Secondary | ICD-10-CM

## 2019-09-09 DIAGNOSIS — R1084 Generalized abdominal pain: Secondary | ICD-10-CM

## 2019-09-09 DIAGNOSIS — E785 Hyperlipidemia, unspecified: Secondary | ICD-10-CM

## 2019-09-09 DIAGNOSIS — Z0000 Encounter for general adult medical examination without abnormal findings: Secondary | ICD-10-CM

## 2019-09-09 DIAGNOSIS — E039 Hypothyroidism, unspecified: Secondary | ICD-10-CM

## 2019-09-09 NOTE — Telephone Encounter (Signed)
[place] labs in for [facility] if they need to be ordered for [facility] can be. FYI student is willing to draw labs if we wanted to have her come up here. But it would be a Ship broker.

## 2019-09-12 ENCOUNTER — Encounter: Payer: Self-pay | Admitting: Physician Assistant

## 2019-09-12 LAB — CBC WITH DIFFERENTIAL/PLATELET
Basophils Absolute: 0.1 10*3/uL (ref 0.0–0.2)
Basos: 1 %
EOS (ABSOLUTE): 0.1 10*3/uL (ref 0.0–0.4)
Eos: 1 %
Hematocrit: 39.6 % (ref 34.0–46.6)
Hemoglobin: 13.9 g/dL (ref 11.1–15.9)
Immature Grans (Abs): 0 10*3/uL (ref 0.0–0.1)
Immature Granulocytes: 0 %
Lymphocytes Absolute: 1.7 10*3/uL (ref 0.7–3.1)
Lymphs: 28 %
MCH: 31.7 pg (ref 26.6–33.0)
MCHC: 35.1 g/dL (ref 31.5–35.7)
MCV: 90 fL (ref 79–97)
Monocytes Absolute: 0.6 10*3/uL (ref 0.1–0.9)
Monocytes: 9 %
Neutrophils Absolute: 3.8 10*3/uL (ref 1.4–7.0)
Neutrophils: 61 %
Platelets: 316 10*3/uL (ref 150–450)
RBC: 4.39 x10E6/uL (ref 3.77–5.28)
RDW: 12 % (ref 11.7–15.4)
WBC: 6.2 10*3/uL (ref 3.4–10.8)

## 2019-09-12 LAB — COMPREHENSIVE METABOLIC PANEL
ALT: 17 IU/L (ref 0–32)
AST: 20 IU/L (ref 0–40)
Albumin/Globulin Ratio: 2.1 (ref 1.2–2.2)
Albumin: 4.7 g/dL (ref 3.8–4.8)
Alkaline Phosphatase: 60 IU/L (ref 39–117)
BUN/Creatinine Ratio: 16 (ref 12–28)
BUN: 13 mg/dL (ref 8–27)
Bilirubin Total: 0.5 mg/dL (ref 0.0–1.2)
CO2: 27 mmol/L (ref 20–29)
Calcium: 9.9 mg/dL (ref 8.7–10.3)
Chloride: 100 mmol/L (ref 96–106)
Creatinine, Ser: 0.79 mg/dL (ref 0.57–1.00)
GFR calc Af Amer: 90 mL/min/{1.73_m2} (ref >59)
GFR calc non Af Amer: 78 mL/min/{1.73_m2} (ref >59)
Globulin, Total: 2.2 g/dL (ref 1.5–4.5)
Glucose: 91 mg/dL (ref 65–99)
Potassium: 4.6 mmol/L (ref 3.5–5.2)
Sodium: 139 mmol/L (ref 134–144)
Total Protein: 6.9 g/dL (ref 6.0–8.5)

## 2019-09-12 LAB — LIPID PANEL WITH LDL/HDL RATIO
Cholesterol, Total: 258 mg/dL — ABNORMAL HIGH (ref 100–199)
HDL: 73 mg/dL (ref >39)
LDL Chol Calc (NIH): 160 mg/dL — ABNORMAL HIGH (ref 0–99)
LDL/HDL Ratio: 2.2 ratio (ref 0.0–3.2)
Triglycerides: 139 mg/dL (ref 0–149)
VLDL Cholesterol Cal: 25 mg/dL (ref 5–40)

## 2019-09-12 LAB — HIGH SENSITIVITY CRP: CRP, High Sensitivity: 0.84 mg/L (ref 0.00–3.00)

## 2019-09-12 LAB — VITAMIN B12: Vitamin B-12: 987 pg/mL (ref 232–1245)

## 2019-09-12 LAB — TSH: TSH: 3.23 u[IU]/mL (ref 0.450–4.500)

## 2019-09-12 LAB — SEDIMENTATION RATE: Sed Rate: 4 mm/hr (ref 0–40)

## 2019-09-14 ENCOUNTER — Encounter: Payer: Self-pay | Admitting: Physician Assistant

## 2019-09-14 NOTE — Telephone Encounter (Signed)
Added vitamin D and folate via [facility] portal Order number: [id number]

## 2019-09-14 NOTE — Telephone Encounter (Signed)
Lakysha,   Thyroid is stable. Triglycerides under 150 and stable.  LDL(bad cholesterol) is up at 160.  HDL looks great at 73.  B12 perfect.  No anemic.  WBC looks good.  Low inflammation rate.  Kidney, liver, glucose look great.   Labs really look good. The only think I would target is decreasing bad cholesterol. I would consider a cholesterol lowering drug to decrease overall 10 year CV risk.   [name]

## 2019-09-14 NOTE — Telephone Encounter (Signed)
Jamie [name] please add vitamin D and folic acid.

## 2019-09-14 NOTE — Telephone Encounter (Signed)
With CBC being normal you do not need iron and ferritn but [name] can add vitamin D to labs.

## 2019-09-14 NOTE — Telephone Encounter (Signed)
Can you call the patient and schedule a My Chart Video Visit for tomorrow.  Thanks so much!!  Abigail Butts

## 2019-09-14 NOTE — Telephone Encounter (Signed)
Scheduled pt a virtual visit 09/15/19 with Jamie [name]

## 2019-09-15 ENCOUNTER — Telehealth (INDEPENDENT_AMBULATORY_CARE_PROVIDER_SITE_OTHER): Payer: Medicare Other | Admitting: Physician Assistant

## 2019-09-15 DIAGNOSIS — K2900 Acute gastritis without bleeding: Secondary | ICD-10-CM

## 2019-09-15 DIAGNOSIS — N3281 Overactive bladder: Secondary | ICD-10-CM

## 2019-09-15 DIAGNOSIS — K5904 Chronic idiopathic constipation: Secondary | ICD-10-CM | POA: Diagnosis not present

## 2019-09-15 DIAGNOSIS — R140 Abdominal distension (gaseous): Secondary | ICD-10-CM
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
## 2019-09-15 NOTE — Telephone Encounter (Signed)
Pt is taking supplements and wants iron panel that has ferritin and iron. Can we please add.

## 2019-09-20 ENCOUNTER — Encounter: Payer: Self-pay | Admitting: Physician Assistant

## 2019-09-21 ENCOUNTER — Telehealth: Payer: Self-pay

## 2019-09-21 ENCOUNTER — Encounter: Payer: Self-pay | Admitting: Physician Assistant

## 2019-09-21 DIAGNOSIS — N3281 Overactive bladder: Secondary | ICD-10-CM | POA: Insufficient documentation

## 2019-09-21 HISTORY — DX: Overactive bladder: N32.81

## 2019-09-21 MED ORDER — LUBIPROSTONE 8 MCG PO CAPS: 8.0000 ug | ORAL_CAPSULE | Freq: Two times a day (BID) | ORAL | 2 refills | Status: DC

## 2019-09-21 MED ORDER — PREDNISONE 50 MG PO TABS: ORAL_TABLET | ORAL | 0 refills | Status: DC

## 2019-09-21 NOTE — Telephone Encounter (Signed)
Call pt: I sent to pharmacy prednisone.

## 2019-09-21 NOTE — Telephone Encounter (Signed)
[name] states she was seen in the ED for hives. She was prescribed prednisone. She states the prednisone never made it to the pharmacy. She wanted to know if Jamie [name] would send in some prednisone.   predniSONE (DELTASONE) 50 MG tablet 4 tablet

## 2019-09-22 ENCOUNTER — Encounter: Payer: Self-pay | Admitting: Physician Assistant

## 2019-09-22 ENCOUNTER — Other Ambulatory Visit: Payer: Self-pay | Admitting: Physician Assistant

## 2019-09-22 NOTE — Telephone Encounter (Signed)
Patient advised.

## 2019-09-23 ENCOUNTER — Encounter: Payer: Self-pay | Admitting: Physician Assistant

## 2019-09-24 ENCOUNTER — Telehealth: Payer: Self-pay | Admitting: Allergy & Immunology

## 2019-09-24 ENCOUNTER — Encounter: Payer: Self-pay | Admitting: Physician Assistant

## 2019-09-24 NOTE — Telephone Encounter (Addendum)
Patient called and would like an allergy testing as soon as possible as she has been hospitalized twice recently for hives. Patient was only seen once in 2019. Patient would like a same day visit to do allergy testing, however patient has been on prednisone, had benadryl, 2 Epi Pens and 3 other things patient did not know the name of as of yesterday.  Please advise if patient can be schedule for an allergy test on Monday in [facility] or if an office visit is needed.

## 2019-09-24 NOTE — Telephone Encounter (Signed)
Scheduled with Webb Silversmith tomorrow for blood work.

## 2019-09-24 NOTE — Telephone Encounter (Signed)
Reviewed chart. Indeed we cannot do testing at this time with her antihistamines that she is on. Maybe schedule for a phone visit tomorrow, as I believe I have some openings in [place]. We can discuss plans at that time. Maybe block off two slots since it might take a while to talk to her?   Salvatore Marvel, MD Allergy and [place]

## 2019-09-25 ENCOUNTER — Encounter: Payer: Self-pay | Admitting: Family Medicine

## 2019-09-25 ENCOUNTER — Ambulatory Visit (INDEPENDENT_AMBULATORY_CARE_PROVIDER_SITE_OTHER): Payer: Medicare Other | Admitting: Family Medicine

## 2019-09-25 ENCOUNTER — Ambulatory Visit: Payer: Medicare Other | Admitting: Physical Therapy

## 2019-09-25 ENCOUNTER — Other Ambulatory Visit: Payer: Self-pay

## 2019-09-25 VITALS — BP 112/80 | HR 93 | Temp 97.9°F | Resp 16 | Ht 64.0 in | Wt 146.0 lb

## 2019-09-25 DIAGNOSIS — T781XXD Other adverse food reactions, not elsewhere classified, subsequent encounter: Secondary | ICD-10-CM

## 2019-09-25 DIAGNOSIS — T7840XA Allergy, unspecified, initial encounter: Secondary | ICD-10-CM

## 2019-09-25 DIAGNOSIS — J301 Allergic rhinitis due to pollen: Secondary | ICD-10-CM | POA: Diagnosis not present

## 2019-09-25 DIAGNOSIS — B999 Unspecified infectious disease: Secondary | ICD-10-CM

## 2019-09-25 DIAGNOSIS — T7840XD Allergy, unspecified, subsequent encounter: Secondary | ICD-10-CM | POA: Diagnosis not present

## 2019-09-25 HISTORY — DX: Unspecified infectious disease: B99.9

## 2019-09-25 HISTORY — DX: Allergy, unspecified, initial encounter: T78.40XA

## 2019-09-25 HISTORY — DX: Allergic rhinitis due to pollen: J30.1

## 2019-09-25 MED ORDER — EPINEPHRINE 0.3 MG/0.3ML IJ SOAJ: 0.3000 mg | INTRAMUSCULAR | 3 refills | Status: DC | PRN

## 2019-09-25 MED ORDER — CETIRIZINE HCL 10 MG PO TABS: ORAL_TABLET | ORAL | 5 refills | Status: DC

## 2019-09-25 MED ORDER — SODIUM CHLORIDE 0.9 % IV SOLN
INTRAVENOUS | Status: DC
Start: ? — End: 2019-09-25

## 2019-09-25 MED ORDER — FAMOTIDINE 20 MG PO TABS: 20.0000 mg | ORAL_TABLET | Freq: Two times a day (BID) | ORAL | 5 refills | Status: DC

## 2019-09-25 NOTE — Patient Instructions (Addendum)
Allergic reaction If your symptoms re-occur, begin a journal of events that occurred for up to 6 hours before your symptoms began including foods and beverages consumed, soaps or perfumes you had contact with, and medications.  Stop taking CBD oil and B12 supplements We have ordered some labs to help [place] evaluate how to treat your reactions.  Begin cetirizine 10 mg twice a day Begin famotidine 20 mg twice a day Avoid mammalian meats, milk, egg, CBD oil, and B12 supplement for now. In case of an allergic reaction, take Benadryl 50 mg  every 4 hours, and if life-threatening symptoms occur, inject with EpiPen 0.3 mg. Return to the clinic in about 3-4 weeks for skin testing to help [place] evaluate your allergies. Remember to stop any antihistamines (cetirizing, Benadryl, and hydroxyzine) for 3 days before this testing.   Allergic rhinitis Continue cetirizine as above Consider Flonase as needed for a stuffy nose Continue saline nasal rinses as needed for nasal symptoms. Use this before any medicated nasal sprays for best result  Recurrent infections Consider a Tdap vaccine when you are no longer having allergic reactions. We will order follow up labs for about 4-6 weeks after you receive the Tdap vaccine.  Call the clinic if this treatment plan is not working well for you  Follow up in 4 weeks or sooner if needed.

## 2019-09-25 NOTE — Progress Notes (Signed)
[place]  [address] Dept: [phone]  FOLLOW UP NOTE  Patient ID: Jamie [name], female    DOB: 1950-09-13  Age: 69 y.o. MRN: [id number] Date of Office Visit: 09/25/2019  Assessment  Chief Complaint: Allergic Reaction  HPI Jamie [name] is a 69 year old female who presents to the clinic today for evaluation of hives and allergic reaction.  She was last seen in this clinic on 10/23/2017 by Dr. Ernst Bowler for evaluation of adverse food reaction, concern for immunodeficiency, nasal itching, cough, anaphylactic reaction due to shellfish, and anemia.  At that time her food testing was slightly positive to milk, however she was eating cheese with no problem.  At that time environmental testing was positive only to [facility].  Immunoglobulin levels were normal, pneumococcal titers were protective to 14 out of 23 types, tetanus titer was protective, complement activity was normal, and fish and shellfish panel were negative.  Diphtheria titers were noted to be nonprotective and a Tdap and repeat diphtheria titers were recommended.  At today's visit she reports experiencing two episodes of hives 2 days apart.  The first episode began with red splotchy skin that started on her rest and quickly moved to hives covering her body and pruritus.  She reports that she went to the hospital at that time and received prednisone and Benadryl and the hives resolved within 1 hour.  She denies cardiopulmonary or gastrointestinal symptoms with these hives.  She reports that on Wednesday after breakfast which consisted of egg, bacon, and cream with coffee she began to experience blotchy red skin which began on her wrist again.  She reports that she did not develop hives however had red skin and a burning sensation.  She called EMS where she received EpiPen and Benadryl and went to the hospital where she received another EpiPen and further treatment.  She reports that she did feel shortness of breath,  throat closing, diarrhea, and abdominal pain at that time.  She reports new medications including B12 and CBD oil.  She also reports that she had been taking sulfa antibiotic for urinary tract infection for 11 days before the first breakout.  She reports that she does have an increased amount of stress in her life right now and has recently stopped taking Xanax and replaced the Xanax with CBD oil in an attempt to decrease her medication use.  She does not take NSAIDs.  She denies joint and muscle pain and night sweats.  She is not currently taking any antihistamine.  She is currently eating oatmeal and soup from [employer] only.  She reports that she had hives one other time in 2012 when [place] was at the beach for 2 days in a row.  She reports during this time that she did experience shortness of breath and throat closing as well, however, she did not take further action at that time.  She reports that she has had nasal congestion lately for which she is using a nasal saline rinse with moderate relief.  Her current medications are listed in the chart.   Drug Allergies:  Allergies  Allergen Reactions  . Iodine Rash  . Ciprofloxacin     Facial numbness  . Iodinated Diagnostic Agents Other (See Comments)    Face and chest felt "hot", throat felt tight.   . Peanut Butter Flavor Nausea And Vomiting    N/V  . Shellfish Allergy Swelling    Physical Exam: BP 112/80   Pulse 93   Temp  97.9 F (36.6 C) (Temporal)   Resp 16   Ht 5\' 4"  (1.626 m)   Wt 146 lb (66.2 kg)   SpO2 97%   BMI 25.06 kg/m    Physical Exam  Nursing note and vitals reviewed. Constitutional: She is oriented to person, place, and time. She appears well-developed and well-nourished.  HENT:  Head: Normocephalic and atraumatic.  Eyes: Conjunctivae are normal.  Cardiovascular: Normal rate and regular rhythm.  No murmur heard. Respiratory: Effort normal and breath sounds normal.  Lungs clear to auscultation  Musculoskeletal:         General: Normal range of motion.     Cervical back: Normal range of motion and neck supple.  Neurological: She is alert and oriented to person, place, and time.  Skin: Skin is warm and dry.  Scattered hypopigmented areas. No hives, no rash noted.   Assessment and Plan: 1. Allergic reaction, subsequent encounter   2. Adverse food reaction, subsequent encounter   3. Recurrent infections   4. Seasonal allergic rhinitis due to pollen     Meds ordered this encounter  Medications  . EPINEPHrine 0.3 mg/0.3 mL IJ SOAJ injection    Sig: Inject 0.3 mLs (0.3 mg total) into the muscle as needed for anaphylaxis.    Dispense:  2 each    Refill:  3  . cetirizine (ZYRTEC) 10 MG tablet    Sig: Take 1 tablet by mouth twice daily    Dispense:  60 tablet    Refill:  5  . famotidine (PEPCID) 20 MG tablet    Sig: Take 1 tablet (20 mg total) by mouth 2 (two) times daily.    Dispense:  60 tablet    Refill:  5    Patient Instructions  Allergic reaction If your symptoms re-occur, begin a journal of events that occurred for up to 6 hours before your symptoms began including foods and beverages consumed, soaps or perfumes you had contact with, and medications.  Stop taking CBD oil and B12 supplements We have ordered some labs to help [place] evaluate how to treat your reactions.  Begin cetirizine 10 mg twice a day Begin famotidine 20 mg twice a day Avoid mammalian meats, milk, egg, CBD oil, and B12 supplement for now. In case of an allergic reaction, take Benadryl 50 mg  every 4 hours, and if life-threatening symptoms occur, inject with EpiPen 0.3 mg. Return to the clinic in about 3-4 weeks for skin testing to help [place] evaluate your allergies. Remember to stop any antihistamines (cetirizing, Benadryl, and hydroxyzine) for 3 days before this testing.   Allergic rhinitis Continue cetirizine as above Consider Flonase as needed for a stuffy nose Continue saline nasal rinses as needed for nasal symptoms. Use this  before any medicated nasal sprays for best result  Recurrent infections Consider a Tdap vaccine when you are no longer having allergic reactions. We will order follow up labs for about 4-6 weeks after you receive the Tdap vaccine.  Call the clinic if this treatment plan is not working well for you  Follow up in 4 weeks or sooner if needed.   Return in about 4 weeks (around 10/23/2019), or if symptoms worsen or fail to improve.    Thank you for the opportunity to care for this patient.  Please do not hesitate to contact me with questions.  Gareth Morgan, FNP Allergy and [place] [facility] [place]

## 2019-09-29 ENCOUNTER — Encounter: Payer: Self-pay | Admitting: Physician Assistant

## 2019-09-29 ENCOUNTER — Encounter: Payer: Self-pay | Admitting: Allergy & Immunology

## 2019-09-29 ENCOUNTER — Ambulatory Visit (INDEPENDENT_AMBULATORY_CARE_PROVIDER_SITE_OTHER): Payer: Medicare Other | Admitting: Allergy & Immunology

## 2019-09-29 ENCOUNTER — Other Ambulatory Visit: Payer: Self-pay

## 2019-09-29 ENCOUNTER — Telehealth: Payer: Self-pay

## 2019-09-29 ENCOUNTER — Ambulatory Visit: Payer: Medicare Other | Admitting: Allergy & Immunology

## 2019-09-29 VITALS — BP 122/86 | HR 90 | Temp 97.6°F | Resp 18

## 2019-09-29 DIAGNOSIS — T782XXD Anaphylactic shock, unspecified, subsequent encounter: Secondary | ICD-10-CM | POA: Diagnosis not present

## 2019-09-29 DIAGNOSIS — J301 Allergic rhinitis due to pollen: Secondary | ICD-10-CM

## 2019-09-29 NOTE — Patient Instructions (Addendum)
1. Idiopathic anaphylaxis - Start Zyrtec (cetirizine) 10mg  twice daily (STOP THE BENADRYL). - We are getting more labs: ginger, basic foods, corn, beet, coffee, and tapioca - You can eat anything that was in the [employer] soup and anything that was NEGATIVE on your testing from two years ago. - Change to an oat based milk instead of cow's milk. - Circle any foods that you are interested in testing at the next visit. - Stay on your Xanax for now until we figure out if you are allergic to the ingredients in the CBD chews. - You are going through a LOT right now, so we need to keep your anxiety under control. - Testing thus far shows an elevated IgE to milk and negative IgE to egg  2. Return in about 4 weeks (around 10/27/2019). This can be an in-person, a virtual Webex or a telephone follow up visit.   Please inform us of any Emergency Department visits, hospitalizations, or changes in symptoms. Call us before going to the ED for breathing or allergy symptoms since we might be able to fit you in for a sick visit. Feel free to contact us anytime with any questions, problems, or concerns.  It was a pleasure to see you again today!  Websites that have reliable patient information: 1. American Academy of Asthma, Allergy, and Immunology: www.aaaai.org 2. Food Allergy Research and Education (FARE): foodallergy.org 3. Mothers of Asthmatics: http://www.asthmacommunitynetwork.org 4. American College of Allergy, Asthma, and Immunology: www.acaai.org   COVID-19 Vaccine Information can be found at: ShippingScam.co.uk For questions related to vaccine distribution or appointments, please email vaccine@[email or web address] or call [phone].     "Like" [place] on Facebook and Instagram for our latest updates!        Make sure you are registered to vote! If you have moved or changed any of your contact information, you will need to get this updated  before voting!  In some cases, you MAY be able to register to vote online: [email or web address]

## 2019-09-29 NOTE — Telephone Encounter (Signed)
-----   Message from Valentina Shaggy, MD sent at 09/29/2019  8:25 AM EST ----- Tamela Gammon - can you call the patient to reschedule her skin testing? Fara Olden

## 2019-09-29 NOTE — Telephone Encounter (Signed)
Per Dr. Ernst Bowler request contacted patient regarding her testing today which the call to approxamately almost 25 min's to discuss why she can not take her test today. Advise patient to reschedule 4 weeks out due to her most recent reactions with Allergic reactions. Patient was upset and was addiment on doing the testing today. I told her Dr. Ernst Bowler will not do the testing due to her most recent reactions and wanted further reasoning why. She had an extensive work-up for her reaction last Friday and most of the results came back. Advise patient she had allergy to dairy and still waiting on results for Alpha-gal. Patient was upset and I told her we cannot do anything at this time and if she wanted to still come in they can discuss other options

## 2019-09-29 NOTE — Progress Notes (Signed)
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 Relevance  Anxiety - previously on Xanax  Polypharmacy - mostly with herbals/supplements  Social stressors, including a daughter who is addicted to drugs   Jamie [name] presents for a visit to discuss her allergic reactions. She has not been seen by me since 2019 and presented last week for an evaluation of the same problem. She was evaluated by Webb Silversmith on Friday and had a large amount of blood work sent. Most of this is still pending, but she did have a present, albeit low, IgE to milk. Therefore, I recommended empiric avoidance to tr yto prevent any more reactions. I am unsure of the relevance of this, but she is going to try to avoid this. We did send a lot of other IgE tests today, including ginger, corn, beet, coffee, and tapioca; these items were in the foods consumed prior to the reactions. The tapioca in particular, was in two different items. We are ordering a gelatin IgE to complement the alpha gal panel. She is on a number of supplements, which I again asked her to stop. Many of these are in a gelatin capsule. At this point, we are essentially casting a large net and hoping something comes up. I do think that her anxiety plays a large role in this, and I did recommend that she talked to her PCP about starting a longer acting anxiety medication than Xanax, possibly something like bupropion or an SSRI. I will defer to the PCP for this. We will regroup after the testing to determine the next steps.     Plan/Recommendations:   1. Idiopathic anaphylaxis - Start Zyrtec (cetirizine) 37m twice daily (STOP THE BENADRYL). - We are getting more labs: ginger, basic foods, corn, beet, coffee, and tapioca - You can eat anything that was in the [employer] soup and anything that was NEGATIVE on your testing from two years ago. - Change to an oat based milk instead of cow's milk. -  Circle any foods that you are interested in testing at the next visit. - Stay on your Xanax for now until we figure out if you are allergic to the ingredients in the CBD chews. - You are going through a LOT right now, so we need to keep your anxiety under control. - Testing thus far shows an elevated IgE to milk and negative IgE to egg  2. Return in about 4 weeks (around 10/27/2019). This can be an in-person, a virtual Webex or a telephone follow up visit.   Subjective:   Jamie DLUGOSZis a 69y.o. female presenting today for follow up of  Chief Complaint  Patient presents with   Allergic Reaction    Jamie TESSIERhas a history of the following: Patient Active Problem List   Diagnosis Date Noted   Allergic reaction 09/25/2019   Recurrent infections 09/25/2019   Seasonal allergic rhinitis due to pollen 09/25/2019   OAB (overactive bladder) 09/21/2019   Stress due to family tension 07/15/2019   Acute gastritis 07/14/2019   Epigastric pain 07/06/2019   Tachycardia 01/02/2019   Numbness and tingling of right side of face 12/31/2018   Right-sided headache 12/31/2018   Cervical radiculopathy 10/01/2018   Vaginal irritation 09/15/2018   Anal itching 09/15/2018   Urine frequency 06/13/2018   ETD (Eustachian tube dysfunction), right 06/11/2018   Fatigue 01/15/2018   Heel pain, bilateral 01/15/2018  Muscle strain of upper back 01/15/2018   Acute bilateral low back pain with bilateral sciatica 10/07/2017   Pelvic floor weakness 09/16/2017   Bilateral headaches 09/16/2017   Adverse food reaction 09/02/2017   Vitamin D deficiency 08/23/2017   Iron deficiency anemia secondary to inadequate dietary iron intake 08/23/2017   Abnormal urine odor 08/23/2017   Atypical chest pain 08/23/2017   Abdominal bloating 08/23/2017   Anxiety 08/23/2017   Carotid stenosis, asymptomatic, bilateral 05/08/2017   History of excessive cerumen 05/08/2017   Cardiovascular  risk factor 05/08/2017   Gastritis and duodenitis 04/15/2017   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   Vision changes 01/25/2017   Hematuria 01/25/2017   Hx of diverticulitis of colon 01/25/2017   Acute left lower quadrant pain 01/25/2017   Exposure to chemical inhalation 11/06/2016   Right knee pain 11/05/2016   Former smoker 05/14/2016   Hypopigmentation 01/11/2016   Anaphylactic reaction due to food 01/11/2016   Vaginal dryness 01/11/2016   Multiple food allergies 01/11/2016   Adhesive capsulitis of shoulder 10/17/2015   Carpal tunnel syndrome 10/17/2015   Left arm numbness 10/13/2015   Neck pain 10/13/2015   Pernicious anemia 10/13/2015   Chronically dry eyes 10/13/2015   DDD (degenerative disc disease), cervical 08/31/2015   Mid back pain on left side 07/20/2015   Hair loss 07/20/2015   Family history of renal cancer 07/20/2015   History of shingles 07/19/2015   Low iron stores 07/19/2015   Subclinical hypothyroidism 07/19/2015   Constipation 07/19/2015   Hyperpigmentation of skin 07/19/2015   Depression with anxiety 06/18/2015   Normocytic anemia 06/18/2015   Diverticulitis of large intestine with abscess without bleeding    B12 deficiency 06/13/2015   Migraine with aura and with status migrainosus, not intractable 04/20/2015   Lung mass 04/20/2015   Generalized abdominal pain 01/24/2014   Osteopenia 01/20/2014   Subacute ethmoidal sinusitis 04/30/2013   Seasonal and perennial allergic rhinitis 11/06/2011   Diverticulosis of large intestine 07/03/2011   Hyperlipidemia 12/13/2010   ONYCHOMYCOSIS, TOENAILS 06/09/2010   ANXIETY DEPRESSION 06/09/2010    History obtained from: chart review and patient.  Jamie [name] is a 69 y.o. female presenting for a follow up visit.  She was last seen in March 2019.  At that time, she was evaluated for a multitude of various concerns, including adverse food  reactions, immunodeficiency, nasal itching, and anemia.  At the visit, she had already undergone IgE testing to a panel of foods which was positive only to milk, but even then it was only 0.11.  She was tolerating cheese without a problem so I consider this a false positive.  She also came to me with IgG food testing which was positive to the entire panel.  I told her that this was not indicative of a food allergy at all and she should ignore these.  We did obtain testing to look for seafood allergy.  We did prescribe her an EpiPen.  We asked her to follow-up in 3 to 6 months, but she presents now almost 2 years later.  Review of her labs from the last visit showed an environmental allergy panel positive only to oak, normal immunoglobulin levels, protective pneumococcal titers, protective tetanus titers, nonprotective diphtheria titers, normal complement activity, and a negative seafood panel.  We recommended that she receive an updated Tdap and we recommended a food challenge to rule out the seafood allergy.   In the interim, she has had 2 different ER  visits just this month for allergic reactions.  Saturday February 6th: reaction started at 10:30pm at night. Mac and cheese, jelly bean, BBQ.  She also had some coleslaw.  She reports that she developed pruritus in her neck which spread to her trunk and abdomen as well as her back.  She did endorse some tongue itching as well but no shortness of breath, wheezing, vomiting, diarrhea, or cramping.  During that ED visit, she received epinephrine 0.3 mg, Benadryl 50 mg, Solu-Medrol 125 mg, Pepcid 20 mg, and 1 normal saline bolus.  She was discharged on a couple more days of prednisone.   Wednesday February 10th:  This one was "more massive".  Per the note, she broke out once again and urticarial rashes over her legs as well as the upper back.  She also reported that her tongue was full.  She was also having trouble speaking.  She received to injections of  epinephrine. She ate ginger, one cup of coffee, bacon, and the ginger chew, and four boiled eggs.  In the field, she was given prednisone 50 mg, Benadryl 50 mg, and IM epinephrine.  She tells me that her body has a "slow response" in that she reacts to foods on a very delayed schedule.  Therefore, she is very concerned about her ability to tolerate certain foods.  She has been eating only chicken noodle soup from [employer] as well as oatmeal made with water.  She is very afraid of eating anything else and wants to know everything that she is allergic to.  She was seen by our nurse practitioner, Webb Silversmith, this past Friday.  She ordered a milk IgE, egg IgE, and an alpha gal panel.  She also ordered a serum tryptase.  The egg came back negative and the milk came back at 0.14.  The alpha gal and tryptase are still pending.  Since her reactions, she has tolerated only the [employer] chicken noodle soup.  She has also been eating oatmeal and drinking lots of water.  She is concerned about malnutrition as well as weight loss.  [employer] Chicken Noodle Soup:  January 2019 Food Allergy Testing:     Regarding further triggers, she is really unsure.  For the rest of 2019 as well as 2020, she had no reactions at all.  She was eating anything out of the sun.  She denies any recent tick bites.  It should be noted that she is taking a slew of over-the-counter herbal supplements.  She did bring them all today and has been laid out on the table.  Most of these are vitamins such as B12 or vitamin D, but there are some other random items as well.  She also had tried stopping her Xanax and using CBD Gummies instead.  She has stopped the CBD Gummies since her first ER visit earlier this month, but her anxiety has been out of control since that happened.  She is on no other anxiety medication and tells me that she has been on Xanax for quite some time.  The CBD Gummies were actually working fairly well before her reactions.  She is going  through a lot at home.  Her daughter who is 54 is admitted to the hospital right now with a MRSA bone infection.  She apparently is an IV drug user.  Her daughters children are currently with her father, who apparently is a convicted child abuser.  This provides quite a bit of stress for [facility] and she thinks it might be  a trigger for her recent allergic reaction.  Otherwise, there have been no changes to her past medical history, surgical history, family history, or social history.    Review of Systems  Constitutional: Negative.  Negative for chills, fever, malaise/fatigue and weight loss.  HENT: Positive for sore throat. Negative for congestion, ear discharge and ear pain.   Eyes: Negative for pain, discharge and redness.  Respiratory: Positive for cough and stridor. Negative for sputum production, shortness of breath and wheezing.   Cardiovascular: Negative.  Negative for chest pain and palpitations.  Gastrointestinal: Negative for abdominal pain, diarrhea, heartburn, nausea and vomiting.  Skin: Positive for itching and rash.  Neurological: Negative for dizziness and headaches.  Endo/Heme/Allergies: Negative for environmental allergies. Does not bruise/bleed easily.       Positive for concern for food allergies.        Objective:   Blood pressure 122/86, pulse 90, temperature 97.6 F (36.4 C), temperature source Temporal, resp. rate 18, SpO2 95 %. There is no height or weight on file to calculate BMI.   Physical Exam: deferred (billed based on time today)   Diagnostic studies: labs sent instead since we could not do skin testing, as it had not been long enough since the last reaction   Total of 45 minutes, greater than 50% of which was spent in discussion of treatment and management options.    Salvatore Marvel, MD  [facility] [place]

## 2019-09-29 NOTE — Progress Notes (Signed)
Patient called on 2/15 apparently requesting allergy testing. However, she was seen in the ED less than two weeks ago. Therefore, she needs to wait at least one month before we do any skin testing since doing it to soon after the reaction can lead to false negatives. I will ask the front desk to call and let her know that. There is no point in wasting skin testing when the results could be falsely negative.   Salvatore Marvel, MD [facility] [place]

## 2019-09-30 ENCOUNTER — Ambulatory Visit: Payer: Medicare Other | Admitting: Physician Assistant

## 2019-10-01 ENCOUNTER — Encounter: Payer: Self-pay | Admitting: Allergy & Immunology

## 2019-10-01 NOTE — Telephone Encounter (Signed)
We did spend quite a bit of time with her going back and forth with the plan, however since we saw her in clinic, we cannot bill for telephone charges on the same day.   Salvatore Marvel, MD [facility] [place]

## 2019-10-02 LAB — IGE MILK W/ COMPONENT REFLEX: F002-IgE Milk: 0.14 kU/L — AB

## 2019-10-02 LAB — F245-IGE EGG, WHOLE: Egg, Whole IgE: <0.1 kU/L

## 2019-10-02 LAB — TRYPTASE: Tryptase: 4.9 ug/L (ref 2.2–13.2)

## 2019-10-02 LAB — ALPHA-GAL PANEL
Alpha Gal IgE*: <0.1 kU/L (ref <0.10)
Beef (Bos spp) IgE: <0.1 kU/L (ref <0.35)
Class Interpretation: 0
Class Interpretation: 0
Class Interpretation: 0
Lamb/Mutton (Ovis spp) IgE: <0.1 kU/L (ref <0.35)
Pork (Sus spp) IgE: <0.1 kU/L (ref <0.35)

## 2019-10-02 LAB — C074-IGE GELATIN: C074-IgE Gelatin: <0.1 kU/L

## 2019-10-03 LAB — ALLERGEN, BEET: Red Beet: <0.1 kU/L

## 2019-10-03 LAB — ALLERGEN PROFILE, BASIC FOOD
Allergen Corn, IgE: <0.1 kU/L
Beef IgE: <0.1 kU/L
Chocolate/Cacao IgE: <0.1 kU/L
Egg, Whole IgE: <0.1 kU/L
Food Mix (Seafoods) IgE: NEGATIVE
Milk IgE: 0.17 kU/L — AB
Peanut IgE: <0.1 kU/L
Pork IgE: <0.1 kU/L
Soybean IgE: <0.1 kU/L
Wheat IgE: <0.1 kU/L

## 2019-10-03 LAB — ALLERGEN, GINGER, RF270: Allergen Ginger IgE: <0.1 kU/L

## 2019-10-03 LAB — ALLERGEN, COFFEE, RF221: Coffee: <0.1 kU/L

## 2019-10-05 NOTE — Progress Notes (Signed)
All labs have resulted.  

## 2019-10-07 ENCOUNTER — Telehealth (INDEPENDENT_AMBULATORY_CARE_PROVIDER_SITE_OTHER): Payer: Medicare Other | Admitting: Physician Assistant

## 2019-10-07 ENCOUNTER — Encounter: Payer: Self-pay | Admitting: Physician Assistant

## 2019-10-07 VITALS — BP 123/84 | HR 101 | Temp 97.0°F | Ht 64.0 in | Wt 135.0 lb

## 2019-10-07 DIAGNOSIS — Z91199 Patient's noncompliance with other medical treatment and regimen due to unspecified reason: Secondary | ICD-10-CM

## 2019-10-07 DIAGNOSIS — E440 Moderate protein-calorie malnutrition: Secondary | ICD-10-CM

## 2019-10-07 DIAGNOSIS — Z5329 Procedure and treatment not carried out because of patient's decision for other reasons: Secondary | ICD-10-CM

## 2019-10-07 HISTORY — DX: Moderate protein-calorie malnutrition: E44.0

## 2019-10-07 NOTE — Progress Notes (Signed)
10/27/2018: having allergy skin test Was told by allergy doctor to stop all medications until then and has been of them for the past two weeks Isn't eating and states she is "starving" has lost down to 135 lbs

## 2019-10-07 NOTE — Progress Notes (Signed)
Patient ID: Jamie [name], female   DOB: 24-Aug-1950, 69 y.o.   MRN: [id number] .Marland KitchenVirtual Visit via Video Note  Called patient four times. Sent doximity text. Waiting on mychart off and on for 20 minutes.

## 2019-10-08 ENCOUNTER — Other Ambulatory Visit: Payer: Self-pay

## 2019-10-08 ENCOUNTER — Encounter: Payer: Self-pay | Admitting: Allergy & Immunology

## 2019-10-08 ENCOUNTER — Ambulatory Visit (INDEPENDENT_AMBULATORY_CARE_PROVIDER_SITE_OTHER): Payer: Medicare Other | Admitting: Allergy & Immunology

## 2019-10-08 VITALS — BP 116/74 | HR 78 | Temp 97.3°F | Resp 18 | Wt 139.2 lb

## 2019-10-08 DIAGNOSIS — T781XXD Other adverse food reactions, not elsewhere classified, subsequent encounter: Secondary | ICD-10-CM | POA: Diagnosis not present

## 2019-10-08 DIAGNOSIS — T782XXD Anaphylactic shock, unspecified, subsequent encounter: Secondary | ICD-10-CM

## 2019-10-08 DIAGNOSIS — J301 Allergic rhinitis due to pollen: Secondary | ICD-10-CM | POA: Diagnosis not present

## 2019-10-08 LAB — ALLERGEN, TAPIOCA
Allergen Specific IGE Tapioca: <0.35 kU/L (ref <0.35)
Class: 0
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
## 2019-10-08 NOTE — Patient Instructions (Addendum)
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 - Testing was only positive to milk, so avoid this in all FORMS!  - Information on Xolair provided. - We will move your testing up one week. - Prednisone pack provided to use in case of reactions (these are 10mg  tablets instead of 50mg  tablets). - Have [name] your PA call me here to discuss the anxiety medication. - Anxiety is NOT a sign of weakness!  - Your lab changes are from your steroids, so DO NOT worry about it.   2. Return for skin testing.    Please inform us of any Emergency Department visits, hospitalizations, or changes in symptoms. Call us before going to the ED for breathing or allergy symptoms since we might be able to fit you in for a sick visit. Feel free to contact us anytime with any questions, problems, or concerns.  It was a pleasure to see you again today!  Websites that have reliable patient information: 1. American Academy of Asthma, Allergy, and Immunology: www.aaaai.org 2. Food Allergy Research and Education (FARE): foodallergy.org 3. Mothers of Asthmatics: http://www.asthmacommunitynetwork.org 4. American College of Allergy, Asthma, and Immunology: www.acaai.org   COVID-19 Vaccine Information can be found at: [email or web address].[email or web address].uk For questions related to vaccine distribution or appointments, please email [email or web address] or call [phone].     "Like" [place] on Facebook and Instagram for our latest updates!        Make sure you are registered to vote! If you have moved or changed any of your contact information, you will need to get this updated before voting!  In some cases, you MAY be able to register to vote online: [email or web address]

## 2019-10-09 ENCOUNTER — Encounter: Payer: Self-pay | Admitting: Allergy & Immunology

## 2019-10-09 ENCOUNTER — Telehealth (INDEPENDENT_AMBULATORY_CARE_PROVIDER_SITE_OTHER): Payer: Medicare Other | Admitting: Physician Assistant

## 2019-10-09 VITALS — Ht 64.0 in | Wt 135.0 lb

## 2019-10-09 DIAGNOSIS — E440 Moderate protein-calorie malnutrition: Secondary | ICD-10-CM

## 2019-10-09 DIAGNOSIS — J302 Other seasonal allergic rhinitis: Secondary | ICD-10-CM

## 2019-10-09 DIAGNOSIS — J3089 Other allergic rhinitis: Secondary | ICD-10-CM

## 2019-10-09 DIAGNOSIS — F418 Other specified anxiety disorders: Secondary | ICD-10-CM | POA: Diagnosis not present

## 2019-10-09 DIAGNOSIS — F411 Generalized anxiety disorder: Secondary | ICD-10-CM | POA: Diagnosis not present

## 2019-10-09 DIAGNOSIS — L509 Urticaria, unspecified: Secondary | ICD-10-CM | POA: Diagnosis not present

## 2019-10-09 DIAGNOSIS — T782XXS Anaphylactic shock, unspecified, sequela: Secondary | ICD-10-CM

## 2019-10-09 DIAGNOSIS — T781XXS Other adverse food reactions, not elsewhere classified, sequela: Secondary | ICD-10-CM

## 2019-10-09 MED ORDER — ESCITALOPRAM OXALATE 5 MG PO TABS: 5.0000 mg | ORAL_TABLET | Freq: Every day | ORAL | 0 refills | Status: DC
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
## 2019-10-10 LAB — TRYPTASE: Tryptase: 4.9 ug/L (ref 2.2–13.2)

## 2019-10-11 ENCOUNTER — Encounter: Payer: Self-pay | Admitting: Physician Assistant

## 2019-10-12 ENCOUNTER — Encounter: Payer: Self-pay | Admitting: Allergy & Immunology

## 2019-10-12 ENCOUNTER — Encounter: Payer: Self-pay | Admitting: Physician Assistant

## 2019-10-12 DIAGNOSIS — T782XXA Anaphylactic shock, unspecified, initial encounter: Secondary | ICD-10-CM

## 2019-10-12 DIAGNOSIS — L509 Urticaria, unspecified: Secondary | ICD-10-CM

## 2019-10-12 DIAGNOSIS — F411 Generalized anxiety disorder: Secondary | ICD-10-CM

## 2019-10-12 HISTORY — DX: Anaphylactic shock, unspecified, initial encounter: T78.2XXA

## 2019-10-12 HISTORY — DX: Urticaria, unspecified: L50.9

## 2019-10-12 HISTORY — DX: Generalized anxiety disorder: F41.1

## 2019-10-15 ENCOUNTER — Encounter: Payer: Self-pay | Admitting: Physician Assistant

## 2019-10-15 DIAGNOSIS — Z818 Family history of other mental and behavioral disorders: Secondary | ICD-10-CM

## 2019-10-15 DIAGNOSIS — F39 Unspecified mood [affective] disorder: Secondary | ICD-10-CM

## 2019-10-15 DIAGNOSIS — F418 Other specified anxiety disorders: Secondary | ICD-10-CM

## 2019-10-15 NOTE — Telephone Encounter (Signed)
Referral pended. Please attach diagnosis and sign

## 2019-10-22 ENCOUNTER — Encounter: Payer: Self-pay | Admitting: Allergy & Immunology

## 2019-10-22 ENCOUNTER — Ambulatory Visit (INDEPENDENT_AMBULATORY_CARE_PROVIDER_SITE_OTHER): Payer: Medicare Other | Admitting: Allergy & Immunology

## 2019-10-22 ENCOUNTER — Other Ambulatory Visit: Payer: Self-pay

## 2019-10-22 VITALS — BP 122/72 | HR 94 | Temp 98.0°F | Resp 16

## 2019-10-22 DIAGNOSIS — T782XXD Anaphylactic shock, unspecified, subsequent encounter: Secondary | ICD-10-CM

## 2019-10-22 NOTE — Progress Notes (Signed)
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 relevance  Anxiety - previously on Xanax (took one in clinic today after the testing)  Polypharmacy - mostly with herbals/supplements  Social stressors, including a daughter who is addicted to drugs   Skin testing today confirms that none of the foods are relevantly reactive.  Chicken was ever so slightly raised, but certainly not enough to be considered positive.  She seems somewhat relieved by this, but continues to make me promise that she is not allergic to any foods.  I explained the limitations of testing, but I also emphasized that there is a high negative predictive value.  She seems somewhat comforted by this.  She does experience an anxiety attack after the testing is completed.  We told her she could take a Xanax and we ended up giving her some Zyrtec and 20 mg of prednisone.  We monitored her for 15 minutes and she was great.   Plan/Recommendations:   1. Idiopathic anaphylaxis - Restart with Zyrtec (cetirizine) 10mg  twice daily. - Testing was negative to the entire panel (results provided). - You *should* be able to introduce these foods back into your diet. - Continue with your anxiety medications.  - Prednisone pack provided to use in case of reactions.  2. Return in about 4 months (around 02/21/2020) or earlier if needed.   Subjective:   Jamie [name] is a 69 y.o. female presenting today for follow up of  Chief Complaint  Patient presents with  . Allergy Testing    Jamie [name] has a history of the following: Patient Active Problem List   Diagnosis Date Noted  . GAD (generalized anxiety disorder) 10/12/2019  . Hives of unknown origin 10/12/2019  . Anaphylactic syndrome 10/12/2019  . Moderate protein-calorie malnutrition ([place]) 10/07/2019  . Allergic reaction 09/25/2019  . Recurrent infections 09/25/2019  . Seasonal  allergic rhinitis due to pollen 09/25/2019  . OAB (overactive bladder) 09/21/2019  . Stress due to family tension 07/15/2019  . Acute gastritis 07/14/2019  . Epigastric pain 07/06/2019  . Tachycardia 01/02/2019  . Numbness and tingling of right side of face 12/31/2018  . Right-sided headache 12/31/2018  . Cervical radiculopathy 10/01/2018  . Vaginal irritation 09/15/2018  . Anal itching 09/15/2018  . Urine frequency 06/13/2018  . ETD (Eustachian tube dysfunction), right 06/11/2018  . Fatigue 01/15/2018  . Heel pain, bilateral 01/15/2018  . Muscle strain of upper back 01/15/2018  . Acute bilateral low back pain with bilateral sciatica 10/07/2017  . Pelvic floor weakness 09/16/2017  . Bilateral headaches 09/16/2017  . Adverse food reaction 09/02/2017  . Vitamin D deficiency 08/23/2017  . Iron deficiency anemia secondary to inadequate dietary iron intake 08/23/2017  . Abnormal urine odor 08/23/2017  . Atypical chest pain 08/23/2017  . Abdominal bloating 08/23/2017  . Anxiety about health 08/23/2017  . Carotid stenosis, asymptomatic, bilateral 05/08/2017  . History of excessive cerumen 05/08/2017  . Cardiovascular risk factor 05/08/2017  . Gastritis and duodenitis 04/15/2017  . 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 Vision changes 01/25/2017  . Hematuria 01/25/2017  . Hx of diverticulitis of colon 01/25/2017  . Acute left lower quadrant pain 01/25/2017  . Exposure to chemical inhalation 11/06/2016  . Right knee pain 11/05/2016  . Former smoker 05/14/2016  . Hypopigmentation 01/11/2016  . Anaphylactic reaction due to food 01/11/2016  . Vaginal  dryness 01/11/2016  . Multiple food allergies 01/11/2016  . 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 Carpal tunnel syndrome 10/17/2015  . Left arm numbness 10/13/2015  . Neck pain 10/13/2015  . Pernicious anemia 10/13/2015  . Chronically dry eyes 10/13/2015  . DDD (degenerative disc disease),  cervical 08/31/2015  . Mid back pain on left side 07/20/2015  . Hair loss 07/20/2015  . Family history of renal cancer 07/20/2015  . History of shingles 07/19/2015  . Low iron stores 07/19/2015  . Subclinical hypothyroidism 07/19/2015  . Constipation 07/19/2015  . Hyperpigmentation of skin 07/19/2015  . Depression with anxiety 06/18/2015  . Normocytic anemia 06/18/2015  . Diverticulitis of large intestine with abscess without bleeding   . B12 deficiency 06/13/2015  . Migraine with aura and with status migrainosus, not intractable 04/20/2015  . Lung mass 04/20/2015  . Generalized abdominal pain 01/24/2014  . Osteopenia 01/20/2014  . Subacute ethmoidal sinusitis 04/30/2013  . Seasonal and perennial allergic rhinitis 11/06/2011  . Diverticulosis of large intestine 07/03/2011  . Hyperlipidemia 12/13/2010  . ONYCHOMYCOSIS, TOENAILS 06/09/2010  . ANXIETY DEPRESSION 06/09/2010    History obtained from: chart review and patient.  Jamie [name] is a 69 y.o. female presenting for skin testing.  She has been seen multiple times in the last month or so for recurrent episodes of hives and angioedema.  I have continued to feel that her reactions are mostly based on anxiety, which is a well-known cause of hives and swelling.  However, she has been very hesitant to accept this as an etiology.  I did talk to her physician assessment, Ms. Jamie [name], and we came to the conclusion that she needed to start an anxiety medication.  Therefore, they started low-dose Lexapro I believe.  We also schedule her for skin testing since she was insisting that these were related to food.  We had done a multitude of blood testing and the only thing that came back positive was milk at a very low level.  We recommended that she continue to avoid milk in all forms.  Since last visit, she has mostly done well.  She has been on her anxiety medication (Lexapro). She also has her Xanax to use as needed for acute episodes.  She has been  off of her antihistamines for about a week or so now.  She would like the entire food panel done today.  Her mood is completely different today.  She is almost in a manic phase today rather than very depressed.  She is very talkative and friendly with the staff.  She also insist on having one of our medical assistants be with her during the entirety of the skin testing.  Otherwise, there have been no changes to her past medical history, surgical history, family history, or social history.    Review of Systems  Constitutional: Negative.  Negative for chills, fever, malaise/fatigue and weight loss.  HENT: Negative.  Negative for congestion, ear discharge, ear pain and sore throat.   Eyes: Negative for pain, discharge and redness.  Respiratory: Negative for cough, sputum production, shortness of breath and wheezing.   Cardiovascular: Negative.  Negative for chest pain and palpitations.  Gastrointestinal: Negative for abdominal pain, constipation, diarrhea, heartburn, nausea and vomiting.  Skin: Positive for itching and rash.  Neurological: Negative for dizziness and headaches.  Endo/Heme/Allergies: Negative for environmental allergies. Does not bruise/bleed easily.       Objective:   Blood pressure 122/72, pulse 94, temperature 98 F (36.7 C), temperature source  Temporal, resp. rate 16, SpO2 99 %. There is no height or weight on file to calculate BMI.   Physical Exam: deferred since this was a skin testing appointment only    Diagnostic studies:   Allergy Studies:    Food Adult Perc - 10/22/19 1300    Time Antigen Placed  1335    Allergen Manufacturer  Lavella Hammock    Location  Back    Number of allergen test  72     Control-buffer 50% Glycerol  Negative    Control-Histamine 1 mg/ml  2+    1. Peanut  Negative    2. Soybean  Negative    3. Wheat  Negative    4. Sesame  Negative    5. Milk, cow  Negative    6. Egg White, Chicken  Negative    7. Casein  Negative    8. Shellfish  Mix  Negative    9. Fish Mix  Negative    10. Cashew  Negative    11. Pecan Food  Negative    12. [place]  Negative    13. Almond  Negative    14. Hazelnut  Negative    15. [place] nut  Negative    16. Coconut  Negative    17. Pistachio  Negative    18. Catfish  Negative    19. Bass  Negative    20. Trout  Negative    21. Tuna  Negative    22. Salmon  Negative    23. Flounder  Negative    24. Codfish  Negative    25. Shrimp  Negative    26. Crab  Negative    27. Lobster  Negative    28. Oyster  Negative    29. Scallops  Negative    30. Barley  Negative    31. Oat   Negative    32. Rye   Negative    33. Hops  Negative    34. Rice  Negative    35. Cottonseed  Negative    36. Saccharomyces Cerevisiae   Negative    37. Pork  Negative    38. [place] Meat  Negative    39. Chicken Meat  Negative    40. Beef  Negative    41. Lamb  Negative    42. Tomato  Negative    43. White Potato  Negative    44. Sweet Potato  Negative    45. Pea, Green/English  Negative    46. Navy Bean  Negative    47. Mushrooms  Negative    48. Avocado  Negative    49. Onion  Negative    50. Cabbage  Negative    51. Carrots  Negative    52. Celery  Negative    53. Corn  Negative    54. Cucumber  Negative    55. Grape (White seedless)  Negative    56. Orange   Negative    57. Banana  Negative    58. Apple  Negative    59. Peach  Negative    60. Strawberry  Negative    61. Cantaloupe  Negative    62. Watermelon  Negative    63. Pineapple  Negative    64. Chocolate/Cacao bean  Negative    65. [place] Gum  Negative    66. Acacia (Arabic Gum)  Negative    67. Cinnamon  Negative    68. Nutmeg  Negative  69. Ginger  Negative    70. Garlic  Negative    71. Pepper, black  Negative    72. Mustard  Negative       Allergy testing results were read and interpreted by myself, documented by clinical staff.      Salvatore Marvel, MD  [facility]

## 2019-10-22 NOTE — Patient Instructions (Addendum)
1. Idiopathic anaphylaxis - Restart with Zyrtec (cetirizine) 10mg  twice daily. - Testing was negative to the entire panel (results provided). - You *should* be able to introduce these foods back into your diet. - Continue with your anxiety medications.  - Prednisone pack provided to use in case of reactions.  2. Return in about 4 months (around 02/21/2020) or earlier if needed.    Please inform us of any Emergency Department visits, hospitalizations, or changes in symptoms. Call us before going to the ED for breathing or allergy symptoms since we might be able to fit you in for a sick visit. Feel free to contact us anytime with any questions, problems, or concerns.  It was a pleasure to see you again today!  Websites that have reliable patient information: 1. American Academy of Asthma, Allergy, and Immunology: www.aaaai.org 2. Food Allergy Research and Education (FARE): foodallergy.org 3. Mothers of Asthmatics: http://www.asthmacommunitynetwork.org 4. American College of Allergy, Asthma, and Immunology: www.acaai.org   COVID-19 Vaccine Information can be found at: ShippingScam.co.uk For questions related to vaccine distribution or appointments, please email [email or web address] or call [phone].     "Like" [place] on Facebook and Instagram for our latest updates!        Make sure you are registered to vote! If you have moved or changed any of your contact information, you will need to get this updated before voting!  In some cases, you MAY be able to register to vote online: [email or web address]

## 2019-10-24 ENCOUNTER — Encounter: Payer: Self-pay | Admitting: Allergy & Immunology

## 2019-10-27 ENCOUNTER — Encounter: Payer: Self-pay | Admitting: Neurology

## 2019-10-27 ENCOUNTER — Ambulatory Visit: Payer: Medicare Other | Admitting: Allergy & Immunology

## 2019-10-27 NOTE — Telephone Encounter (Signed)
Mychart message sent to patient.

## 2019-10-28 ENCOUNTER — Other Ambulatory Visit: Payer: Self-pay | Admitting: Physician Assistant

## 2019-10-28 DIAGNOSIS — F419 Anxiety disorder, unspecified: Secondary | ICD-10-CM

## 2019-10-29 ENCOUNTER — Encounter: Payer: Self-pay | Admitting: Physician Assistant

## 2019-10-30 ENCOUNTER — Encounter: Payer: Medicare Other | Admitting: Physician Assistant

## 2019-11-11 ENCOUNTER — Encounter: Payer: Self-pay | Admitting: Physician Assistant

## 2019-11-13 ENCOUNTER — Encounter: Payer: Self-pay | Admitting: Family Medicine

## 2019-11-13 ENCOUNTER — Encounter: Payer: Self-pay | Admitting: Physician Assistant

## 2019-11-16 ENCOUNTER — Other Ambulatory Visit: Payer: Self-pay | Admitting: Neurology

## 2019-11-16 DIAGNOSIS — F418 Other specified anxiety disorders: Secondary | ICD-10-CM

## 2019-11-16 DIAGNOSIS — F411 Generalized anxiety disorder: Secondary | ICD-10-CM

## 2019-11-16 MED ORDER — ESCITALOPRAM OXALATE 5 MG PO TABS: 5.0000 mg | ORAL_TABLET | Freq: Every day | ORAL | 0 refills | Status: DC

## 2019-12-03 ENCOUNTER — Encounter: Payer: Self-pay | Admitting: Family Medicine

## 2019-12-03 ENCOUNTER — Telehealth: Payer: Self-pay | Admitting: Physician Assistant

## 2019-12-03 NOTE — Telephone Encounter (Signed)
For her sore throat I would recommend salt water gargles herbal tea with honey.  She can certainly use sore throat lozenges which can be helpful.  The sore throat could be coming from allergies so she may want to restart her allergy medicine as the pollen levels have been really high or she could consider restarting her reflux medicine as well which can also cause a sore throat.  If she is not better after the weekend then recommend that she do a visit

## 2019-12-03 NOTE — Telephone Encounter (Signed)
Patient states that she has been feeling pain in throat. Hard to swallow. Patients eyes feel weak. Patient would like something called in.

## 2019-12-04 ENCOUNTER — Telehealth (INDEPENDENT_AMBULATORY_CARE_PROVIDER_SITE_OTHER): Payer: Medicare Other | Admitting: Family Medicine

## 2019-12-04 ENCOUNTER — Encounter: Payer: Self-pay | Admitting: Physician Assistant

## 2019-12-04 ENCOUNTER — Encounter: Payer: Self-pay | Admitting: Family Medicine

## 2019-12-04 DIAGNOSIS — J988 Other specified respiratory disorders: Secondary | ICD-10-CM | POA: Diagnosis not present

## 2019-12-04 DIAGNOSIS — B9789 Other viral agents as the cause of diseases classified elsewhere: Secondary | ICD-10-CM

## 2019-12-04 HISTORY — DX: Other viral agents as the cause of diseases classified elsewhere: B97.89

## 2019-12-04 MED ORDER — BENZONATATE 100 MG PO CAPS: 100.0000 mg | ORAL_CAPSULE | Freq: Two times a day (BID) | ORAL | 0 refills | Status: DC | PRN

## 2019-12-04 NOTE — Telephone Encounter (Signed)
Recommendations sent through MyChart.

## 2019-12-04 NOTE — Assessment & Plan Note (Signed)
-  Current constellation of symptoms consistent with viral etiology.  -She is adamant that she needs an antibiotic.  I explained to her that antibiotics do not help resolve an viral illness any quicker and have potential to do harm.  She was not happy that I would not prescribe amoxicillin for her at this time.  -I did recommend that she have testing for COVID and we discussed red flags associated with COVID infection.  She states she will consider having this done.  -I offered to send in something to help with her cough.  She agrees, but does not want anything that may be sedating.  I have sent in [place]. -Recommend supportive care including increased fluids.  She may use tylenol as needed for aches/pains.   -Call for any new or worsening symptoms.

## 2019-12-04 NOTE — Progress Notes (Signed)
Jamie [name] - 69 y.o. female MRN [id number]  Date of birth: 1951-04-04   This visit type was conducted due to national recommendations for restrictions regarding the COVID-19 Pandemic (e.g. social distancing).  This format is felt to be most appropriate for this patient at this time.  All issues noted in this document were discussed and addressed.  No physical exam was performed (except for noted visual exam findings with Video Visits).  I discussed the limitations of evaluation and management by telemedicine and the availability of in person appointments. The patient expressed understanding and agreed to proceed.  I connected with@ on 12/04/19 at  4:00 PM EDT by a video enabled telemedicine application and verified that I am speaking with the correct person using two identifiers.  Present at visit: Luetta Nutting, DO [name]   Patient Location: Home [place] [id number]   Provider location:   [facility]  Chief Complaint  Patient presents with  . Ear Pain    bilateral  . sneezing  . Cough  . Sore Throat  . Fatigue    HPI  Jamie [name] is a 69 y.o. female who presents via audio/video conferencing for a telehealth visit today.  Patient has complaint of 3 day history of dry cough, runny nose, congestion, fatigue, chills and sore throat.  Reports that grandchildren were sick last week with congestion and ear infection.  She doesn't think she has had fever.  She denies body aches.  She has tried several home remedies without significant improvement.  She is requesting course of amoxicillin be sent in for her.     ROS:  A comprehensive ROS was completed and negative except as noted per HPI  Past Medical History:  Diagnosis Date  . Allergy   . Anemia   . Anxiety   . Asthma   . Cataract    bilateral  . Depression   . Diverticulitis   . Diverticulosis   . GERD (gastroesophageal reflux disease)   . Headache   . Hives of unknown origin 10/12/2019  . Hyperlipidemia    . IBS (irritable bowel syndrome)   . Lung mass   . Syncope 03/08/2015  . Thyroid disease     Past Surgical History:  Procedure Laterality Date  . ABDOMINAL HYSTERECTOMY    . COLONOSCOPY    . HEMORRHOID SURGERY    . TUBAL LIGATION    . TUMOR REMOVAL     Left thigh     Family History  Problem Relation Age of Onset  . Diabetes Mother   . Liver cancer Mother        renal cell   . Heart disease Father        first MI in 44s  . Cervical cancer Sister   . Allergic rhinitis Sister   . Asthma Sister   . Heart disease Brother        first MI in 45s  . Alcohol abuse Sister   . Fibromyalgia Sister   . Pancreatic cancer Sister 79  . Arthritis Sister        osteoarthritis  . Emphysema Sister   . Irritable bowel syndrome Sister   . Emphysema Sister   . Leukemia Other        grandson  . Urticaria Daughter   . Allergic rhinitis Daughter   . Stomach cancer Neg Hx   . Colon cancer Neg Hx   . Angioedema Neg Hx   . Eczema Neg Hx   .  Immunodeficiency Neg Hx   . Colon polyps Neg Hx   . Esophageal cancer Neg Hx   . Rectal cancer Neg Hx     Social History   Socioeconomic History  . Marital status: Divorced    Spouse name: Not on file  . Number of children: 3  . Years of education: Not on file  . Highest education level: Not on file  Occupational History  . Occupation: Ship broker  Tobacco Use  . Smoking status: Former Smoker    Packs/day: 0.80    Years: 20.00    Pack years: 16.00    Types: Cigarettes    Quit date: 08/13/1985    Years since quitting: 34.3  . Smokeless tobacco: Never Used  Substance and Sexual Activity  . Alcohol use: Yes    Comment: rare  . Drug use: No  . Sexual activity: Not Currently    Partners: Male    Birth control/protection: None  Other Topics Concern  . Not on file  Social History Narrative   Lives with and grandchild (she is foster parent for grand child) Completed school full time.   Caffeine use; occasional   Works in Personal assistant,  Economist.   In process of divorce.        Social Determinants of Health   Financial Resource Strain:   . Difficulty of Paying Living Expenses:   Food Insecurity:   . Worried About Charity fundraiser in the Last Year:   . Arboriculturist in the Last Year:   Transportation Needs:   . Film/video editor (Medical):   Marland Kitchen Lack of Transportation (Non-Medical):   Physical Activity:   . Days of Exercise per Week:   . Minutes of Exercise per Session:   Stress:   . Feeling of Stress :   Social Connections:   . Frequency of Communication with Friends and Family:   . Frequency of Social Gatherings with Friends and Family:   . Attends Religious Services:   . Active Member of Clubs or Organizations:   . Attends Archivist Meetings:   Marland Kitchen Marital Status:   Intimate Partner Violence:   . Fear of Current or Ex-Partner:   . Emotionally Abused:   Marland Kitchen Physically Abused:   . Sexually Abused:      Current Outpatient Medications:  .  ALPRAZolam (XANAX) 0.5 MG tablet, TAKE 1 TABLET BY MOUTH UP TO TWICE DAILY AS NEEDED, Disp: 60 tablet, Rfl: 2 .  EPINEPHrine 0.3 mg/0.3 mL IJ SOAJ injection, Inject 0.3 mLs (0.3 mg total) into the muscle as needed for anaphylaxis., Disp: 2 each, Rfl: 3 .  escitalopram (LEXAPRO) 5 MG tablet, Take 1 tablet (5 mg total) by mouth at bedtime., Disp: 30 tablet, Rfl: 0 .  gabapentin (NEURONTIN) 300 MG capsule, TAKE 1 CAPSULE(300 MG) BY MOUTH TWICE DAILY, Disp: 180 capsule, Rfl: 0 .  levothyroxine (SYNTHROID) 50 MCG tablet, Take 1 tablet (50 mcg total) by mouth daily., Disp: 90 tablet, Rfl: 0 .  lubiprostone (AMITIZA) 8 MCG capsule, Take 1 capsule (8 mcg total) by mouth 2 (two) times daily with a meal., Disp: 60 capsule, Rfl: 2 .  NON FORMULARY, Take 1 capsule by mouth 3 (three) times daily. Herbal supplements for gastritis, Disp: , Rfl:   EXAM:  VITALS per patient if applicable: There were no vitals taken for this visit.  GENERAL: alert,  oriented, appears well and in no acute distress.  She sounds congested.   HEENT:  atraumatic, conjunttiva clear, no obvious abnormalities on inspection of external nose and ears  NECK: normal movements of the head and neck  LUNGS: on inspection no signs of respiratory distress, breathing rate appears normal, no obvious gross SOB, gasping or wheezing  CV: no obvious cyanosis  MS: moves all visible extremities without noticeable abnormality  PSYCH/NEURO: pleasant and cooperative, no obvious depression or anxiety, speech and thought processing grossly intact  ASSESSMENT AND PLAN:  Discussed the following assessment and plan:  Viral respiratory infection -Current constellation of symptoms consistent with viral etiology.  -She is adamant that she needs an antibiotic.  I explained to her that antibiotics do not help resolve an viral illness any quicker and have potential to do harm.  She was not happy that I would not prescribe amoxicillin for her at this time.  -I did recommend that she have testing for COVID and we discussed red flags associated with COVID infection.  She states she will consider having this done.  -I offered to send in something to help with her cough.  She agrees, but does not want anything that may be sedating.  I have sent in [place]. -Recommend supportive care including increased fluids.  She may use tylenol as needed for aches/pains.   -Call for any new or worsening symptoms.      30 minutes spent including pre visit preparation, review of prior notes and labs, encounter with patient via video visit and same day documentation.  I discussed the assessment and treatment plan with the patient. The patient was provided an opportunity to ask questions and all were answered. The patient agreed with the plan and demonstrated an understanding of the instructions.   The patient was advised to call back or seek an in-person evaluation if the symptoms worsen or if the  condition fails to improve as anticipated.    Luetta Nutting, DO

## 2019-12-04 NOTE — Progress Notes (Signed)
Patient reports this is day 4.  Patient reports a dry cough, runny nose with green stuff sometimes and then there will be nothing.    She has tried cinnamon, and honey. She has tried green tea,  Water and Gatorade She has tried Advil She reports burning in her throat really bad.   Patient reports she prefers a capsule since she a hard time swallowing.   She reports feeling fatigue and chest hurts when she coughs.

## 2019-12-07 ENCOUNTER — Telehealth: Payer: Self-pay | Admitting: Physician Assistant

## 2019-12-07 DIAGNOSIS — E782 Mixed hyperlipidemia: Secondary | ICD-10-CM

## 2019-12-07 DIAGNOSIS — E038 Other specified hypothyroidism: Secondary | ICD-10-CM

## 2019-12-07 DIAGNOSIS — Z0000 Encounter for general adult medical examination without abnormal findings: Secondary | ICD-10-CM

## 2019-12-07 DIAGNOSIS — E039 Hypothyroidism, unspecified: Secondary | ICD-10-CM

## 2019-12-07 NOTE — Telephone Encounter (Signed)
Patient states this is her yearly physical and would like everything tested from A to Z per patient. She couldn't wait until [name] came back at the end of May. Would like blood work sent in a week before to the location she had it last year.

## 2019-12-07 NOTE — Telephone Encounter (Signed)
This is an unrealistic expectation.  We only order labs that are age appropriated based on risk factors.  Is there something in particular she is concerned about this.

## 2019-12-08 ENCOUNTER — Other Ambulatory Visit: Payer: Self-pay | Admitting: Family Medicine

## 2019-12-08 MED ORDER — AMOXICILLIN-POT CLAVULANATE 875-125 MG PO TABS: 1.0000 | ORAL_TABLET | Freq: Two times a day (BID) | ORAL | 0 refills | Status: DC

## 2019-12-08 NOTE — Telephone Encounter (Signed)
Patient saw Dr. Zigmund Daniel on 12/04/2019 with following impression: Viral respiratory infection -Current constellation of symptoms consistent with viral etiology.  -She is adamant that she needs an antibiotic.  I explained to her that antibiotics do not help resolve an viral illness any quicker and have potential to do harm.  She was not happy that I would not prescribe amoxicillin for her at this time.  -I did recommend that she have testing for COVID and we discussed red flags associated with COVID infection.  She states she will consider having this done.  -I offered to send in something to help with her cough.  She agrees, but does not want anything that may be sedating.  I have sent in [place]. -Recommend supportive care including increased fluids.  She may use tylenol as needed for aches/pains.   -Call for any new or worsening symptoms.

## 2019-12-08 NOTE — Telephone Encounter (Signed)
PT was seen by Dr. Zigmund Daniel on 12/04/19  PT is having continuius symptoms. Mrs. Karras said"feels like it moved down to my chest. My covid test came back negative. Can you guys just send something in for me?" Tried to persuade the PT with a FU appointment. Declined.  Please Advise.

## 2019-12-08 NOTE — Telephone Encounter (Signed)
Mychart message sent to patient.

## 2019-12-08 NOTE — Telephone Encounter (Signed)
See message from patient. It looks like nothing particular is needed, but she has had recent lab work so not sure what is necessary for yearly physical?

## 2019-12-09 NOTE — Telephone Encounter (Signed)
Looks like ordered through [facility] at last visit. Labs entered.

## 2019-12-09 NOTE — Telephone Encounter (Signed)
Okay for lipid, CMP and TSH.

## 2019-12-09 NOTE — Addendum Note (Signed)
Addended byAnnamaria Helling on: 12/09/2019 08:19 AM   Modules accepted: Orders

## 2019-12-15 ENCOUNTER — Other Ambulatory Visit: Payer: Self-pay | Admitting: Neurology

## 2019-12-15 DIAGNOSIS — F411 Generalized anxiety disorder: Secondary | ICD-10-CM

## 2019-12-15 DIAGNOSIS — F418 Other specified anxiety disorders: Secondary | ICD-10-CM

## 2019-12-15 MED ORDER — ESCITALOPRAM OXALATE 5 MG PO TABS: 5.0000 mg | ORAL_TABLET | Freq: Every day | ORAL | 0 refills | Status: DC

## 2019-12-17 ENCOUNTER — Encounter: Payer: Medicare Other | Admitting: Family Medicine

## 2019-12-21 ENCOUNTER — Ambulatory Visit: Payer: Medicare Other | Admitting: Psychology

## 2019-12-28 ENCOUNTER — Ambulatory Visit: Payer: Medicare Other | Admitting: Psychology

## 2020-01-04 ENCOUNTER — Ambulatory Visit: Payer: Medicare Other | Admitting: Psychology

## 2020-01-12 ENCOUNTER — Other Ambulatory Visit: Payer: Self-pay | Admitting: Neurology

## 2020-01-12 MED ORDER — GABAPENTIN 300 MG PO CAPS: ORAL_CAPSULE | ORAL | 0 refills | Status: DC

## 2020-01-16 ENCOUNTER — Encounter: Payer: Self-pay | Admitting: Physician Assistant

## 2020-01-17 ENCOUNTER — Encounter: Payer: Self-pay | Admitting: Physician Assistant

## 2020-01-19 ENCOUNTER — Other Ambulatory Visit: Payer: Self-pay | Admitting: Physician Assistant

## 2020-01-19 DIAGNOSIS — I6523 Occlusion and stenosis of bilateral carotid arteries: Secondary | ICD-10-CM

## 2020-01-20 ENCOUNTER — Ambulatory Visit: Payer: Medicare Other

## 2020-01-20 ENCOUNTER — Other Ambulatory Visit: Payer: Self-pay | Admitting: Physician Assistant

## 2020-01-20 ENCOUNTER — Other Ambulatory Visit: Payer: Self-pay

## 2020-01-20 ENCOUNTER — Encounter: Payer: Self-pay | Admitting: Allergy & Immunology

## 2020-01-20 DIAGNOSIS — Z1231 Encounter for screening mammogram for malignant neoplasm of breast: Secondary | ICD-10-CM

## 2020-01-21 NOTE — Telephone Encounter (Signed)
Please advise 

## 2020-01-22 NOTE — Progress Notes (Signed)
Carotid u/s have stayed the same at less than 50 percent. We can check again in 5 years.

## 2020-01-27 NOTE — Telephone Encounter (Signed)
Can you give me a hand with this one please?  Thanks

## 2020-01-28 NOTE — Telephone Encounter (Signed)
Referral to [facility] in [facility] has been placed. Scheduled appointment for 8/26 at 10am with Jones Broom. Patient informed and given facility phone number and address.

## 2020-02-02 ENCOUNTER — Other Ambulatory Visit: Payer: Self-pay

## 2020-02-02 ENCOUNTER — Ambulatory Visit (INDEPENDENT_AMBULATORY_CARE_PROVIDER_SITE_OTHER): Payer: Medicare Other | Admitting: Physician Assistant

## 2020-02-02 ENCOUNTER — Ambulatory Visit (INDEPENDENT_AMBULATORY_CARE_PROVIDER_SITE_OTHER): Payer: Medicare Other

## 2020-02-02 VITALS — BP 121/71 | HR 84 | Ht 64.0 in | Wt 131.0 lb

## 2020-02-02 DIAGNOSIS — E782 Mixed hyperlipidemia: Secondary | ICD-10-CM | POA: Diagnosis not present

## 2020-02-02 DIAGNOSIS — M79671 Pain in right foot: Secondary | ICD-10-CM | POA: Diagnosis not present

## 2020-02-02 DIAGNOSIS — M79672 Pain in left foot: Secondary | ICD-10-CM

## 2020-02-02 DIAGNOSIS — R350 Frequency of micturition: Secondary | ICD-10-CM

## 2020-02-02 DIAGNOSIS — D508 Other iron deficiency anemias: Secondary | ICD-10-CM

## 2020-02-02 DIAGNOSIS — Z131 Encounter for screening for diabetes mellitus: Secondary | ICD-10-CM

## 2020-02-02 DIAGNOSIS — G629 Polyneuropathy, unspecified: Secondary | ICD-10-CM

## 2020-02-02 DIAGNOSIS — E559 Vitamin D deficiency, unspecified: Secondary | ICD-10-CM

## 2020-02-02 DIAGNOSIS — Z0000 Encounter for general adult medical examination without abnormal findings: Secondary | ICD-10-CM | POA: Diagnosis not present

## 2020-02-02 DIAGNOSIS — H6123 Impacted cerumen, bilateral: Secondary | ICD-10-CM

## 2020-02-02 DIAGNOSIS — R790 Abnormal level of blood mineral: Secondary | ICD-10-CM

## 2020-02-02 HISTORY — DX: Polyneuropathy, unspecified: G62.9

## 2020-02-02 LAB — POCT URINALYSIS DIP (CLINITEK)
Bilirubin, UA: NEGATIVE
Blood, UA: NEGATIVE
Glucose, UA: NEGATIVE mg/dL
Ketones, POC UA: NEGATIVE mg/dL
Nitrite, UA: NEGATIVE
POC PROTEIN,UA: NEGATIVE
Spec Grav, UA: <=1.005 — AB (ref 1.010–1.025)
Urobilinogen, UA: 0.2 E.U./dL
pH, UA: 7 (ref 5.0–8.0)

## 2020-02-02 MED ORDER — GABAPENTIN 300 MG PO CAPS: ORAL_CAPSULE | ORAL | 3 refills | Status: DC

## 2020-02-02 MED ORDER — FOLTANX 3-35-2 MG PO TABS: 1.0000 | ORAL_TABLET | Freq: Two times a day (BID) | ORAL | 11 refills | Status: DC

## 2020-02-02 NOTE — Patient Instructions (Signed)
Plantar Fasciitis Rehab Ask your health care provider which exercises are safe for you. Do exercises exactly as told by your health care provider and adjust them as directed. It is normal to feel mild stretching, pulling, tightness, or discomfort as you do these exercises. Stop right away if you feel sudden pain or your pain gets worse. Do not begin these exercises until told by your health care provider. Stretching and range-of-motion exercises These exercises warm up your muscles and joints and improve the movement and flexibility of your foot. These exercises also help to relieve pain. Plantar fascia stretch  1. Sit with your left / right leg crossed over your opposite knee. 2. Hold your heel with one hand with that thumb near your arch. With your other hand, hold your toes and gently pull them back toward the top of your foot. You should feel a stretch on the bottom of your toes or your foot (plantar fascia) or both. 3. Hold this stretch for__________ seconds. 4. Slowly release your toes and return to the starting position. Repeat __________ times. Complete this exercise __________ times a day. Gastrocnemius stretch, standing This exercise is also called a calf (gastroc) stretch. It stretches the muscles in the back of the upper calf. 1. Stand with your hands against a wall. 2. Extend your left / right leg behind you, and bend your front knee slightly. 3. Keeping your heels on the floor and your back knee straight, shift your weight toward the wall. Do not arch your back. You should feel a gentle stretch in your upper left / right calf. 4. Hold this position for __________ seconds. Repeat __________ times. Complete this exercise __________ times a day. Soleus stretch, standing This exercise is also called a calf (soleus) stretch. It stretches the muscles in the back of the lower calf. 1. Stand with your hands against a wall. 2. Extend your left / right leg behind you, and bend your front  knee slightly. 3. Keeping your heels on the floor, bend your back knee and shift your weight slightly over your back leg. You should feel a gentle stretch deep in your lower calf. 4. Hold this position for __________ seconds. Repeat __________ times. Complete this exercise __________ times a day. Gastroc and soleus stretch, standing step This exercise stretches the muscles in the back of the lower leg. These muscles are in the upper calf (gastrocnemius) and the lower calf (soleus). 1. Stand with the ball of your left / right foot on a step. The ball of your foot is on the walking surface, right under your toes. 2. Keep your other foot firmly on the same step. 3. Hold on to the wall or a railing for balance. 4. Slowly lift your other foot, allowing your body weight to press your left / right heel down over the edge of the step. You should feel a stretch in your left / right calf. 5. Hold this position for __________ seconds. 6. Return both feet to the step. 7. Repeat this exercise with a slight bend in your left / right knee. Repeat __________ times with your left / right knee straight and __________ times with your left / right knee bent. Complete this exercise __________ times a day. Balance exercise This exercise builds your balance and strength control of your arch to help take pressure off your plantar fascia. Single leg stand If this exercise is too easy, you can try it with your eyes closed or while standing on a pillow. 1.   Without shoes, stand near a railing or in a doorway. You may hold on to the railing or door frame as needed. 2. Stand on your left / right foot. Keep your big toe down on the floor and try to keep your arch lifted. Do not let your foot roll inward. 3. Hold this position for __________ seconds. Repeat __________ times. Complete this exercise __________ times a day. This information is not intended to replace advice given to you by your health care provider. Make sure  you discuss any questions you have with your health care provider. Document Revised: 11/20/2018 Document Reviewed: 05/28/2018 Elsevier Patient Education  [place] Maintenance After Age 5 After age 48, you are at a higher risk for certain long-term diseases and infections as well as injuries from falls. Falls are a major cause of broken bones and head injuries in people who are older than age 42. Getting regular preventive care can help to keep you healthy and well. Preventive care includes getting regular testing and making lifestyle changes as recommended by your health care provider. Talk with your health care provider about:  Which screenings and tests you should have. A screening is a test that checks for a disease when you have no symptoms.  A diet and exercise plan that is right for you. What should I know about screenings and tests to prevent falls? Screening and testing are the best ways to find a health problem early. Early diagnosis and treatment give you the best chance of managing medical conditions that are common after age 73. Certain conditions and lifestyle choices may make you more likely to have a fall. Your health care provider may recommend:  Regular vision checks. Poor vision and conditions such as cataracts can make you more likely to have a fall. If you wear glasses, make sure to get your prescription updated if your vision changes.  Medicine review. Work with your health care provider to regularly review all of the medicines you are taking, including over-the-counter medicines. Ask your health care provider about any side effects that may make you more likely to have a fall. Tell your health care provider if any medicines that you take make you feel dizzy or sleepy.  Osteoporosis screening. Osteoporosis is a condition that causes the bones to get weaker. This can make the bones weak and cause them to break more easily.  Blood pressure screening. Blood  pressure changes and medicines to control blood pressure can make you feel dizzy.  Strength and balance checks. Your health care provider may recommend certain tests to check your strength and balance while standing, walking, or changing positions.  Foot health exam. Foot pain and numbness, as well as not wearing proper footwear, can make you more likely to have a fall.  Depression screening. You may be more likely to have a fall if you have a fear of falling, feel emotionally low, or feel unable to do activities that you used to do.  Alcohol use screening. Using too much alcohol can affect your balance and may make you more likely to have a fall. What actions can I take to lower my risk of falls? General instructions  Talk with your health care provider about your risks for falling. Tell your health care provider if: ? You fall. Be sure to tell your health care provider about all falls, even ones that seem minor. ? You feel dizzy, sleepy, or off-balance.  Take over-the-counter and prescription medicines only as told by  your health care provider. These include any supplements.  Eat a healthy diet and maintain a healthy weight. A healthy diet includes low-fat dairy products, low-fat (lean) meats, and fiber from whole grains, beans, and lots of fruits and vegetables. Home safety  Remove any tripping hazards, such as rugs, cords, and clutter.  Install safety equipment such as grab bars in bathrooms and safety rails on stairs.  Keep rooms and walkways well-lit. Activity   Follow a regular exercise program to stay fit. This will help you maintain your balance. Ask your health care provider what types of exercise are appropriate for you.  If you need a cane or walker, use it as recommended by your health care provider.  Wear supportive shoes that have nonskid soles. Lifestyle  Do not drink alcohol if your health care provider tells you not to drink.  If you drink alcohol, limit how  much you have: ? 0-1 drink a day for women. ? 0-2 drinks a day for men.  Be aware of how much alcohol is in your drink. In the [place] one drink equals one typical bottle of beer (12 oz), one-half glass of wine (5 oz), or one shot of hard liquor (1 oz).  Do not use any products that contain nicotine or tobacco, such as cigarettes and e-cigarettes. If you need help quitting, ask your health care provider. Summary  Having a healthy lifestyle and getting preventive care can help to protect your health and wellness after age 28.  Screening and testing are the best way to find a health problem early and help you avoid having a fall. Early diagnosis and treatment give you the best chance for managing medical conditions that are more common for people who are older than age 86.  Falls are a major cause of broken bones and head injuries in people who are older than age 78. Take precautions to prevent a fall at home.  Work with your health care provider to learn what changes you can make to improve your health and wellness and to prevent falls. This information is not intended to replace advice given to you by your health care provider. Make sure you discuss any questions you have with your health care provider. Document Revised: 11/20/2018 Document Reviewed: 06/12/2017 Elsevier Patient Education  2020 [employer].

## 2020-02-02 NOTE — Progress Notes (Deleted)
Patient worried about:   Frequent urination Trouble concentrating  Numbness in face Headaches Feeling excessively cold Dizziness  Nausea Some diarrhea yesterday Pain in bottoms of feet "worse than a toothache"  Urine-no discharge.   Diarrhea this morning but rea  No divervitis  Take zyrtec daily.   Feet   Podiatry referral.

## 2020-02-02 NOTE — Progress Notes (Signed)
Please culture urine

## 2020-02-02 NOTE — Progress Notes (Signed)
  Patient had decreased sensation to bottom of foot, but could feel sensation to big toes and third toes bilaterally. Patients sensation on top of foot intact bilaterally.

## 2020-02-03 ENCOUNTER — Ambulatory Visit: Payer: Medicare Other

## 2020-02-03 ENCOUNTER — Encounter: Payer: Medicare Other | Admitting: Physician Assistant

## 2020-02-03 LAB — URINE CULTURE
MICRO NUMBER:: 10620117
SPECIMEN QUALITY:: ADEQUATE

## 2020-02-04 ENCOUNTER — Ambulatory Visit: Payer: Medicare Other

## 2020-02-05 NOTE — Progress Notes (Signed)
Jamie [name],   You do have a right 58mm heel spur. No acute changes. Evidence of some bone density loss. Keep follow up with podiatry to consider plantar fasciitis treatment.

## 2020-02-05 NOTE — Progress Notes (Signed)
Jamie [name],   Urine culture did not grow any one bacteria. There was some mixed flora but likely more contamination. How are you feeling today?

## 2020-02-06 LAB — CBC
Hematocrit: 38.1 % (ref 34.0–46.6)
Hemoglobin: 13.1 g/dL (ref 11.1–15.9)
MCH: 31.3 pg (ref 26.6–33.0)
MCHC: 34.4 g/dL (ref 31.5–35.7)
MCV: 91 fL (ref 79–97)
Platelets: 268 10*3/uL (ref 150–450)
RBC: 4.18 x10E6/uL (ref 3.77–5.28)
RDW: 12.2 % (ref 11.7–15.4)
WBC: 4.4 10*3/uL (ref 3.4–10.8)

## 2020-02-06 LAB — IRON,TIBC AND FERRITIN PANEL
Ferritin: 65 ng/mL (ref 15–150)
Iron Saturation: 36 % (ref 15–55)
Iron: 105 ug/dL (ref 27–139)
Total Iron Binding Capacity: 290 ug/dL (ref 250–450)
UIBC: 185 ug/dL (ref 118–369)

## 2020-02-06 LAB — COMPREHENSIVE METABOLIC PANEL
ALT: 25 IU/L (ref 0–32)
AST: 29 IU/L (ref 0–40)
Albumin/Globulin Ratio: 2.1 (ref 1.2–2.2)
Albumin: 4.6 g/dL (ref 3.8–4.8)
Alkaline Phosphatase: 72 IU/L (ref 48–121)
BUN/Creatinine Ratio: 25 (ref 12–28)
BUN: 22 mg/dL (ref 8–27)
Bilirubin Total: 0.6 mg/dL (ref 0.0–1.2)
CO2: 25 mmol/L (ref 20–29)
Calcium: 9.9 mg/dL (ref 8.7–10.3)
Chloride: 102 mmol/L (ref 96–106)
Creatinine, Ser: 0.88 mg/dL (ref 0.57–1.00)
GFR calc Af Amer: 79 mL/min/{1.73_m2} (ref >59)
GFR calc non Af Amer: 68 mL/min/{1.73_m2} (ref >59)
Globulin, Total: 2.2 g/dL (ref 1.5–4.5)
Glucose: 83 mg/dL (ref 65–99)
Potassium: 5.2 mmol/L (ref 3.5–5.2)
Sodium: 139 mmol/L (ref 134–144)
Total Protein: 6.8 g/dL (ref 6.0–8.5)

## 2020-02-06 LAB — LIPID PANEL
Chol/HDL Ratio: 3.5 ratio (ref 0.0–4.4)
Cholesterol, Total: 220 mg/dL — ABNORMAL HIGH (ref 100–199)
HDL: 62 mg/dL (ref >39)
LDL Chol Calc (NIH): 139 mg/dL — ABNORMAL HIGH (ref 0–99)
Triglycerides: 109 mg/dL (ref 0–149)
VLDL Cholesterol Cal: 19 mg/dL (ref 5–40)

## 2020-02-06 LAB — VITAMIN B12: Vitamin B-12: 353 pg/mL (ref 232–1245)

## 2020-02-06 LAB — VITAMIN D 25 HYDROXY (VIT D DEFICIENCY, FRACTURES): Vit D, 25-Hydroxy: 22.8 ng/mL — ABNORMAL LOW (ref 30.0–100.0)

## 2020-02-06 LAB — TSH: TSH: 2.85 u[IU]/mL (ref 0.450–4.500)

## 2020-02-08 NOTE — Progress Notes (Signed)
Jamie [name],   Thyroid is perfect.  B12 on low normal. I think just taking 1075mcg daily would get you in the good normal range.  Vitamin D is low. How much are you taking daily?  Not anemic. CBC looks good.  Iron stores look good.  Kidney, liver, glucose look great!!! Cholesterol is better! Your HDL is wonderful and LDL dropped by 20 points. Way to go!!!!

## 2020-02-08 NOTE — Telephone Encounter (Signed)
L/m for patient to contact me to discuss possible Xolair start in future

## 2020-02-09 ENCOUNTER — Encounter: Payer: Self-pay | Admitting: Physician Assistant

## 2020-02-09 DIAGNOSIS — H6123 Impacted cerumen, bilateral: Secondary | ICD-10-CM | POA: Insufficient documentation

## 2020-02-09 HISTORY — DX: Impacted cerumen, bilateral: H61.23

## 2020-02-09 NOTE — Progress Notes (Signed)
Subjective:     Jamie [name] is a 69 y.o. female and is here for a comprehensive physical exam. The patient reports problems - see below. .  Pt having some frequent urination but urine is clear and no pain. No fever, chills, nausea, abdominal or flank pain. No vaginal discharge or itching. Not sexually active.   She is having problems hearing and thinks her ears "are clogged". She has had to have wax removed before.   Ongoing pain in both heels that is "worse than a toothache". Worse in morning and when she gets up. No known injury.   Social History   Socioeconomic History   Marital status: Divorced    Spouse name: Not on file   Number of children: 3   Years of education: Not on file   Highest education level: Not on file  Occupational History   Occupation: Student  Tobacco Use   Smoking status: Former Smoker    Packs/day: 0.80    Years: 20.00    Pack years: 16.00    Types: Cigarettes    Quit date: 08/13/1985    Years since quitting: 34.5   Smokeless tobacco: Never Used  Vaping Use   Vaping Use: Never used  Substance and Sexual Activity   Alcohol use: Yes    Comment: rare   Drug use: No   Sexual activity: Not Currently    Partners: Male    Birth control/protection: None  Other Topics Concern   Not on file  Social History Narrative   Lives with and grandchild (she is foster parent for grand child) Completed school full time.   Caffeine use; occasional   Works in Personal assistant, Economist.   In process of divorce.        Social Determinants of Health   Financial Resource Strain:    Difficulty of Paying Living Expenses:   Food Insecurity:    Worried About Charity fundraiser in the Last Year:    Arboriculturist in the Last Year:   Transportation Needs:    Film/video editor (Medical):    Lack of Transportation (Non-Medical):   Physical Activity:    Days of Exercise per Week:    Minutes of Exercise per Session:   Stress:     Feeling of Stress :   Social Connections:    Frequency of Communication with Friends and Family:    Frequency of Social Gatherings with Friends and Family:    Attends Religious Services:    Active Member of Clubs or Organizations:    Attends Archivist Meetings:    Marital Status:   Intimate Partner Violence:    Fear of Current or Ex-Partner:    Emotionally Abused:    Physically Abused:    Sexually Abused:    Health Maintenance  Topic Date Due   COVID-19 Vaccine (1) 02/18/2020 (Originally 12/19/1962)   TETANUS/TDAP  07/15/2020 (Originally 08/14/2015)   PNA vac Low Risk Adult (1 of 2 - PCV13) 07/15/2020 (Originally 12/19/2017)   INFLUENZA VACCINE  03/13/2020   MAMMOGRAM  09/10/2020   DEXA SCAN  09/10/2020   COLONOSCOPY  03/21/2027   Hepatitis C Screening  Completed    The following portions of the patient's history were reviewed and updated as appropriate: allergies, current medications, past family history, past medical history, past social history, past surgical history and problem list.  Review of Systems Pertinent items noted in HPI and remainder of comprehensive ROS otherwise negative.  Objective:    BP 121/71    Pulse 84    Ht 5\' 4"  (1.626 m)    Wt 131 lb (59.4 kg)    SpO2 100%    BMI 22.49 kg/m  General appearance: alert, cooperative and appears stated age Head: Normocephalic, without obvious abnormality, atraumatic Eyes: conjunctivae/corneas clear. PERRL, EOM's intact. Fundi benign. Ears: normal TM's and external ear canals both ears impacted bilaterally.  Nose: Nares normal. Septum midline. Mucosa normal. No drainage or sinus tenderness. Throat: lips, mucosa, and tongue normal; teeth and gums normal Neck: no adenopathy, no carotid bruit, no JVD, supple, symmetrical, trachea midline and thyroid not enlarged, symmetric, no tenderness/mass/nodules Back: symmetric, no curvature. ROM normal. No CVA tenderness. Lungs: clear to auscultation  bilaterally Heart: regular rate and rhythm, S1, S2 normal, no murmur, click, rub or gallop Abdomen: soft, non-tender; bowel sounds normal; no masses,  no organomegaly Extremities: extremities normal, atraumatic, no cyanosis or edema Pulses: 2+ and symmetric Skin: Skin color, texture, turgor normal. No rashes or lesions Lymph nodes: Cervical, supraclavicular, and axillary nodes normal. Neurologic: Alert and oriented X 3, normal strength and tone. Normal symmetric reflexes. Normal coordination and gait   Feet: see foot exam. Tenderness over bilateral heels to palpation.   .. Depression screen [facility] 2/9 02/02/2020 02/02/2020 11/19/2018 09/30/2018 09/10/2018  Decreased Interest 0 1 3 2 2   Down, Depressed, Hopeless 0 2 3 1  0  PHQ - 2 Score 0 3 6 3 2   Altered sleeping 1 1 3 1 2   Tired, decreased energy 0 1 3 2 1   Change in appetite 0 0 3 2 0  Feeling bad or failure about yourself  0 0 0 1 0  Trouble concentrating 0 0 3 1 1   Moving slowly or fidgety/restless 0 0 0 2 0  Suicidal thoughts 0 0 0 0 0  PHQ-9 Score 1 5 18 12 6   Difficult doing work/chores Not difficult at all - Not difficult at all Somewhat difficult Not difficult at all  Some recent data might be hidden   .Marland Kitchen GAD 7 : Generalized Anxiety Score 02/02/2020 02/02/2020 11/19/2018 09/30/2018  Nervous, Anxious, on Edge 1 2 3 2   Control/stop worrying 0 2 3 1   Worry too much - different things 0 2 3 2   Trouble relaxing 0 2 3 1   Restless 0 1 0 0  Easily annoyed or irritable 1 2 3 1   Afraid - awful might happen 0 1 3 0  Total GAD 7 Score 2 12 18 7   Anxiety Difficulty Not difficult at all - Not difficult at all Somewhat difficult    .Marland Kitchen Results for orders placed or performed in visit on 02/02/20  Urine Culture   Specimen: Urine  Result Value Ref Range   MICRO NUMBER: [id number]    SPECIMEN QUALITY: Adequate    Sample Source URINE    STATUS: FINAL    ISOLATE 1:      Growth of mixed flora was isolated, suggesting probable contamination. No  further testing will be performed. If clinically indicated, recollection using a method to minimize contamination, with prompt transfer to Urine Culture Transport Tube, is  recommended.   TSH  Result Value Ref Range   TSH 2.850 0.450 - 4.500 uIU/mL  VITAMIN D 25 Hydroxy (Vit-D Deficiency, Fractures)  Result Value Ref Range   Vit D, 25-Hydroxy 22.8 (L) 30.0 - 100.0 ng/mL  B12  Result Value Ref Range   Vitamin B-12 353 232 - 1,245 pg/mL  CBC  Result Value Ref Range   WBC 4.4 3.4 - 10.8 x10E3/uL   RBC 4.18 3.77 - 5.28 x10E6/uL   Hemoglobin 13.1 11.1 - 15.9 g/dL   Hematocrit 38.1 34.0 - 46.6 %   MCV 91 79 - 97 fL   MCH 31.3 26.6 - 33.0 pg   MCHC 34.4 31 - 35 g/dL   RDW 12.2 11.7 - 15.4 %   Platelets 268 150 - 450 x10E3/uL  Fe+TIBC+Fer  Result Value Ref Range   Total Iron Binding Capacity 290 250 - 450 ug/dL   UIBC 185 118 - 369 ug/dL   Iron 105 27 - 139 ug/dL   Iron Saturation 36 15 - 55 %   Ferritin 65 15.0 - 150.0 ng/mL  Lipid panel  Result Value Ref Range   Cholesterol, Total 220 (H) 100 - 199 mg/dL   Triglycerides 109 0 - 149 mg/dL   HDL 62 >39 mg/dL   VLDL Cholesterol Cal 19 5 - 40 mg/dL   LDL Chol Calc (NIH) 139 (H) 0 - 99 mg/dL   Chol/HDL Ratio 3.5 0.0 - 4.4 ratio  Comprehensive metabolic panel  Result Value Ref Range   Glucose 83 65 - 99 mg/dL   BUN 22 8 - 27 mg/dL   Creatinine, Ser 0.88 0.57 - 1.00 mg/dL   GFR calc non Af Amer 68 >59 mL/min/1.73   GFR calc Af Amer 79 >59 mL/min/1.73   BUN/Creatinine Ratio 25 12 - 28   Sodium 139 134 - 144 mmol/L   Potassium 5.2 3.5 - 5.2 mmol/L   Chloride 102 96 - 106 mmol/L   CO2 25 20 - 29 mmol/L   Calcium 9.9 8.7 - 10.3 mg/dL   Total Protein 6.8 6.0 - 8.5 g/dL   Albumin 4.6 3.8 - 4.8 g/dL   Globulin, Total 2.2 1.5 - 4.5 g/dL   Albumin/Globulin Ratio 2.1 1.2 - 2.2   Bilirubin Total 0.6 0.0 - 1.2 mg/dL   Alkaline Phosphatase 72 48 - 121 IU/L   AST 29 0 - 40 IU/L   ALT 25 0 - 32 IU/L  POCT URINALYSIS DIP (CLINITEK)   Result Value Ref Range   Color, UA yellow yellow   Clarity, UA clear clear   Glucose, UA negative negative mg/dL   Bilirubin, UA negative negative   Ketones, POC UA negative negative mg/dL   Spec Grav, UA <=1.005 (A) 1.010 - 1.025   Blood, UA negative negative   pH, UA 7.0 5.0 - 8.0   POC PROTEIN,UA negative negative, trace   Urobilinogen, UA 0.2 0.2 or 1.0 E.U./dL   Nitrite, UA Negative Negative   Leukocytes, UA Trace (A) Negative    Assessment:    Healthy female exam.      Plan:    Marland KitchenMarland KitchenTerecia was seen today for annual exam.  Diagnoses and all orders for this visit:  Routine physical examination -     TSH -     VITAMIN D 25 Hydroxy (Vit-D Deficiency, Fractures) -     B12 -     CBC -     Fe+TIBC+Fer -     DG Foot Complete Left -     DG Foot Complete Right -     Lipid panel -     Comprehensive metabolic panel  Frequent urination -     POCT URINALYSIS DIP (CLINITEK) -     Urine Culture  Heel pain, bilateral -     DG Foot Complete Left -  DG Foot Complete Right -     Ambulatory referral to Podiatry  Mixed hyperlipidemia -     Lipid panel  Iron deficiency anemia secondary to inadequate dietary iron intake -     B12 -     CBC -     Fe+TIBC+Fer  Low iron stores -     CBC -     Fe+TIBC+Fer  Vitamin D deficiency -     VITAMIN D 25 Hydroxy (Vit-D Deficiency, Fractures)  Neuropathy -     TSH -     B12 -     Comprehensive metabolic panel -     [id number] (FOLTANX) 3-35-2 MG TABS; Take 1 tablet by mouth in the morning and at bedtime.  Screening for diabetes mellitus -     Comprehensive metabolic panel  Impacted cerumen, bilateral  Other orders -     gabapentin (NEURONTIN) 300 MG capsule; TAKE 1 CAPSULE(300 MG) BY MOUTH TWICE DAILY   .. Discussed 150 minutes of exercise a week.  Encouraged vitamin D 1000 units and Calcium 1300mg  or 4 servings of dairy a day.  Fasting labs ordered.  Mammogram ordered.  Pap not needed.  Colonoscopy UTD.   Pt declined vaccines.   Pt having some frequent urination.  Urine culture ordered.   Marland Kitchen.Cerumen Removal Template: Indication: Cerumen impaction of the ear(s) Medical necessity statement: On physical examination, cerumen impairs clinically significant portions of the external auditory canal, and tympanic membrane. Noted obstructive, copious cerumen that cannot be removed without magnification and instrumentations requiring physician skills Consent: Discussed benefits and risks of procedure and verbal consent obtained Procedure: Patient was prepped for the procedure. Utilized an otoscope to assess and take note of the ear canal, the tympanic membrane, and the presence, amount, and placement of the cerumen. Gentle water irrigation and soft plastic curette was utilized to remove cerumen.  Post procedure examination shows cerumen was completely removed. Patient tolerated procedure well. The patient is made aware that they may experience temporary vertigo, temporary hearing loss, and temporary discomfort. If these symptom last for more than 24 hours to call the clinic or proceed to the ED.  Pt does have some sensation loss in feet. Start Foltanx vitamin daily.   She has bilateral heel pain. Likely plantar fasciitis. Discussed ice, good supportive shoes, exercises.  Get xray and referral to podiatry.  See After Visit Summary for Counseling Recommendations

## 2020-02-14 ENCOUNTER — Other Ambulatory Visit: Payer: Self-pay | Admitting: Physician Assistant

## 2020-02-14 DIAGNOSIS — R059 Cough, unspecified: Secondary | ICD-10-CM

## 2020-02-14 DIAGNOSIS — R0602 Shortness of breath: Secondary | ICD-10-CM

## 2020-02-14 DIAGNOSIS — F419 Anxiety disorder, unspecified: Secondary | ICD-10-CM

## 2020-02-14 DIAGNOSIS — R079 Chest pain, unspecified: Secondary | ICD-10-CM

## 2020-02-14 DIAGNOSIS — N898 Other specified noninflammatory disorders of vagina: Secondary | ICD-10-CM

## 2020-02-16 MED ORDER — ALBUTEROL SULFATE HFA 108 (90 BASE) MCG/ACT IN AERS: 2.0000 | INHALATION_SPRAY | Freq: Four times a day (QID) | RESPIRATORY_TRACT | 0 refills | Status: DC | PRN

## 2020-02-16 NOTE — Telephone Encounter (Signed)
Vaginal cream no longer on med list  Xanax - last filled 10/28/2019 #60 with 2 refills

## 2020-02-16 NOTE — Addendum Note (Signed)
Addended byAnnamaria Helling on: 02/16/2020 01:42 PM   Modules accepted: Orders

## 2020-02-23 ENCOUNTER — Ambulatory Visit: Payer: Medicare Other | Admitting: Podiatry

## 2020-02-23 ENCOUNTER — Other Ambulatory Visit: Payer: Self-pay | Admitting: Neurology

## 2020-02-23 NOTE — Telephone Encounter (Signed)
Received request from the pharmacy for Jamie [name] vaginal inserts. It looks like patient is on Premarin vaginal cream. Deny request?

## 2020-03-14 ENCOUNTER — Encounter: Payer: Self-pay | Admitting: Physician Assistant

## 2020-03-14 DIAGNOSIS — N63 Unspecified lump in unspecified breast: Secondary | ICD-10-CM

## 2020-03-14 NOTE — Telephone Encounter (Signed)
Ok to order diagnostic mammogram for patient. Need to call and find out details of which breast and location to put in order.

## 2020-03-15 NOTE — Telephone Encounter (Signed)
Please review pended order.

## 2020-03-15 NOTE — Telephone Encounter (Signed)
Spoke to patient and advised referral to [facility] and advised patient to reach out after consultation per Dr Ernst Bowler to start Xolair if interested

## 2020-03-17 ENCOUNTER — Other Ambulatory Visit: Payer: Self-pay | Admitting: Physician Assistant

## 2020-03-17 ENCOUNTER — Other Ambulatory Visit: Payer: Self-pay | Admitting: Neurology

## 2020-03-17 DIAGNOSIS — F418 Other specified anxiety disorders: Secondary | ICD-10-CM

## 2020-03-17 DIAGNOSIS — F411 Generalized anxiety disorder: Secondary | ICD-10-CM

## 2020-03-17 DIAGNOSIS — N63 Unspecified lump in unspecified breast: Secondary | ICD-10-CM

## 2020-03-25 ENCOUNTER — Encounter: Payer: Medicare Other | Admitting: Podiatry

## 2020-03-31 ENCOUNTER — Other Ambulatory Visit: Payer: Medicare Other

## 2020-04-03 ENCOUNTER — Encounter: Payer: Self-pay | Admitting: Physician Assistant

## 2020-04-12 ENCOUNTER — Ambulatory Visit: Payer: Medicare Other

## 2020-04-12 ENCOUNTER — Ambulatory Visit
Admission: RE | Admit: 2020-04-12 | Discharge: 2020-04-12 | Disposition: A | Discharge status: Home / Self Care | Payer: Medicare Other | Source: Ambulatory Visit | Attending: Physician Assistant | Admitting: Physician Assistant

## 2020-04-12 ENCOUNTER — Other Ambulatory Visit: Payer: Self-pay

## 2020-04-12 ENCOUNTER — Other Ambulatory Visit: Payer: Self-pay | Admitting: Physician Assistant

## 2020-04-12 DIAGNOSIS — N63 Unspecified lump in unspecified breast: Secondary | ICD-10-CM

## 2020-04-12 NOTE — Progress Notes (Signed)
Jamie [name] news. Mammogram was normal.   Also I sent that letter to billing. Whatever it is not accurate because I am apart of that group but they are working on why it was sent out.

## 2020-04-24 ENCOUNTER — Encounter: Payer: Self-pay | Admitting: Physician Assistant

## 2020-04-24 DIAGNOSIS — E559 Vitamin D deficiency, unspecified: Secondary | ICD-10-CM

## 2020-04-25 NOTE — Telephone Encounter (Signed)
Order placed

## 2020-04-25 NOTE — Telephone Encounter (Signed)
Last vitamin D level checked in June.   Vit D, 25-Hydroxy 30.0 - 100.0 ng/mL 22.8Low    Okay to order?

## 2020-04-25 NOTE — Telephone Encounter (Signed)
Ok to order 

## 2020-04-26 ENCOUNTER — Other Ambulatory Visit: Payer: Self-pay | Admitting: Physician Assistant

## 2020-04-26 DIAGNOSIS — K2100 Gastro-esophageal reflux disease with esophagitis, without bleeding: Secondary | ICD-10-CM

## 2020-04-28 ENCOUNTER — Encounter: Payer: Self-pay | Admitting: Physician Assistant

## 2020-04-29 ENCOUNTER — Telehealth (INDEPENDENT_AMBULATORY_CARE_PROVIDER_SITE_OTHER): Payer: Medicare Other | Admitting: Physician Assistant

## 2020-04-29 ENCOUNTER — Encounter: Payer: Self-pay | Admitting: Physician Assistant

## 2020-04-29 VITALS — HR 100 | Temp 98.1°F

## 2020-04-29 DIAGNOSIS — R52 Pain, unspecified: Secondary | ICD-10-CM

## 2020-04-29 DIAGNOSIS — R05 Cough: Secondary | ICD-10-CM

## 2020-04-29 DIAGNOSIS — J988 Other specified respiratory disorders: Secondary | ICD-10-CM | POA: Diagnosis not present

## 2020-04-29 DIAGNOSIS — Z20822 Contact with and (suspected) exposure to covid-19: Secondary | ICD-10-CM

## 2020-04-29 DIAGNOSIS — B9789 Other viral agents as the cause of diseases classified elsewhere: Secondary | ICD-10-CM

## 2020-04-29 DIAGNOSIS — R059 Cough, unspecified: Secondary | ICD-10-CM

## 2020-04-29 MED ORDER — HYDROCOD POLST-CPM POLST ER 10-8 MG/5ML PO SUER: 5.0000 mL | Freq: Two times a day (BID) | ORAL | 0 refills | Status: DC | PRN

## 2020-04-29 NOTE — Progress Notes (Signed)
4 day history: Headache Dizziness Sore throat  Chest pain Nausea Eye pain

## 2020-04-29 NOTE — Telephone Encounter (Signed)
Ok to schedule a virtual today to discuss.

## 2020-05-01 ENCOUNTER — Encounter: Payer: Self-pay | Admitting: Physician Assistant

## 2020-05-02 ENCOUNTER — Encounter: Payer: Self-pay | Admitting: Physician Assistant

## 2020-05-02 ENCOUNTER — Encounter: Payer: Self-pay | Admitting: Neurology

## 2020-05-02 MED ORDER — PREDNISONE 50 MG PO TABS: 50.0000 mg | ORAL_TABLET | Freq: Every day | ORAL | 0 refills | Status: DC

## 2020-05-02 NOTE — Telephone Encounter (Signed)
Those results make no sense to me. Can we see where she got them done and call?
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
## 2020-05-02 NOTE — Telephone Encounter (Signed)
FYI

## 2020-05-03 ENCOUNTER — Encounter: Payer: Self-pay | Admitting: Physician Assistant

## 2020-05-04 MED ORDER — PREDNISONE 20 MG PO TABS: 20.0000 mg | ORAL_TABLET | Freq: Two times a day (BID) | ORAL | 0 refills | Status: DC

## 2020-05-04 NOTE — Addendum Note (Signed)
Addended by: [name] on: 05/04/2020 06:33 AM   Modules accepted: Orders

## 2020-05-12 ENCOUNTER — Other Ambulatory Visit: Payer: Self-pay | Admitting: Allergy & Immunology

## 2020-05-12 ENCOUNTER — Other Ambulatory Visit: Payer: Self-pay | Admitting: Family Medicine

## 2020-05-12 ENCOUNTER — Other Ambulatory Visit: Payer: Self-pay | Admitting: Physician Assistant

## 2020-05-13 ENCOUNTER — Other Ambulatory Visit: Payer: Self-pay | Admitting: Neurology

## 2020-05-13 DIAGNOSIS — U071 COVID-19: Secondary | ICD-10-CM

## 2020-05-13 MED ORDER — SPIROMETER KIT: 1.0000 | PACK | 0 refills | Status: DC | PRN

## 2020-05-13 NOTE — Telephone Encounter (Signed)
Patient left vm asking for a spirometer. Please sign if appropriate.

## 2020-05-17 ENCOUNTER — Encounter: Payer: Self-pay | Admitting: Physician Assistant

## 2020-05-17 DIAGNOSIS — E538 Deficiency of other specified B group vitamins: Secondary | ICD-10-CM

## 2020-05-17 DIAGNOSIS — Z8616 Personal history of COVID-19: Secondary | ICD-10-CM

## 2020-05-17 DIAGNOSIS — Z79899 Other long term (current) drug therapy: Secondary | ICD-10-CM

## 2020-05-17 DIAGNOSIS — E559 Vitamin D deficiency, unspecified: Secondary | ICD-10-CM

## 2020-05-17 DIAGNOSIS — D508 Other iron deficiency anemias: Secondary | ICD-10-CM

## 2020-05-20 NOTE — Addendum Note (Signed)
Addended byAnnamaria Helling on: 05/20/2020 09:31 AM   Modules accepted: Orders

## 2020-05-24 NOTE — Telephone Encounter (Signed)
Fax: [phone]

## 2020-05-31 NOTE — Telephone Encounter (Signed)
Can you figure out exactly what she wants ordered? Make sure she is aware checking for covid antibodies is a GOOD thing. When you have antibodies you have BETTER protection from covid.

## 2020-06-01 NOTE — Telephone Encounter (Signed)
Sounds like Covid antibodies, potassium, and iron.

## 2020-06-01 NOTE — Addendum Note (Signed)
Addended byDonella Stade on: 06/01/2020 12:10 PM   Modules accepted: Orders

## 2020-06-03 LAB — CMP14+EGFR
ALT: 20 IU/L (ref 0–32)
AST: 22 IU/L (ref 0–40)
Albumin/Globulin Ratio: 1.9 (ref 1.2–2.2)
Albumin: 4.3 g/dL (ref 3.8–4.8)
Alkaline Phosphatase: 76 IU/L (ref 44–121)
BUN/Creatinine Ratio: 14 (ref 12–28)
BUN: 11 mg/dL (ref 8–27)
Bilirubin Total: 0.5 mg/dL (ref 0.0–1.2)
CO2: 25 mmol/L (ref 20–29)
Calcium: 9 mg/dL (ref 8.7–10.3)
Chloride: 102 mmol/L (ref 96–106)
Creatinine, Ser: 0.79 mg/dL (ref 0.57–1.00)
GFR calc Af Amer: 90 mL/min/{1.73_m2} (ref >59)
GFR calc non Af Amer: 78 mL/min/{1.73_m2} (ref >59)
Globulin, Total: 2.3 g/dL (ref 1.5–4.5)
Glucose: 100 mg/dL — ABNORMAL HIGH (ref 65–99)
Potassium: 4.5 mmol/L (ref 3.5–5.2)
Sodium: 140 mmol/L (ref 134–144)
Total Protein: 6.6 g/dL (ref 6.0–8.5)

## 2020-06-03 LAB — IRON,TIBC AND FERRITIN PANEL
Ferritin: 118 ng/mL (ref 15–150)
Iron Saturation: 20 % (ref 15–55)
Iron: 56 ug/dL (ref 27–139)
Total Iron Binding Capacity: 274 ug/dL (ref 250–450)
UIBC: 218 ug/dL (ref 118–369)

## 2020-06-03 LAB — CBC WITH DIFFERENTIAL/PLATELET
Basophils Absolute: 0.1 10*3/uL (ref 0.0–0.2)
Basos: 1 %
EOS (ABSOLUTE): 0.1 10*3/uL (ref 0.0–0.4)
Eos: 1 %
Hematocrit: 35.9 % (ref 34.0–46.6)
Hemoglobin: 12.5 g/dL (ref 11.1–15.9)
Immature Grans (Abs): 0 10*3/uL (ref 0.0–0.1)
Immature Granulocytes: 0 %
Lymphocytes Absolute: 1.1 10*3/uL (ref 0.7–3.1)
Lymphs: 22 %
MCH: 31.5 pg (ref 26.6–33.0)
MCHC: 34.8 g/dL (ref 31.5–35.7)
MCV: 90 fL (ref 79–97)
Monocytes Absolute: 0.5 10*3/uL (ref 0.1–0.9)
Monocytes: 9 %
Neutrophils Absolute: 3.5 10*3/uL (ref 1.4–7.0)
Neutrophils: 67 %
Platelets: 301 10*3/uL (ref 150–450)
RBC: 3.97 x10E6/uL (ref 3.77–5.28)
RDW: 12.2 % (ref 11.7–15.4)
WBC: 5.2 10*3/uL (ref 3.4–10.8)

## 2020-06-03 LAB — SARS-COV-2 SEMI-QUANTITATIVE TOTAL ANTIBODY, SPIKE
SARS-CoV-2 Semi-Quant Total Ab: 843.9 U/mL (ref <0.8)
SARS-CoV-2 Spike Ab Interp: POSITIVE

## 2020-06-03 LAB — VITAMIN D 25 HYDROXY (VIT D DEFICIENCY, FRACTURES): Vit D, 25-Hydroxy: 69.9 ng/mL (ref 30.0–100.0)

## 2020-06-03 LAB — VITAMIN B12: Vitamin B-12: 378 pg/mL (ref 232–1245)

## 2020-06-03 NOTE — Telephone Encounter (Signed)
B12 normal range but low normal. Ok to add b12 1018mcg.  Vitamin D wonderful.  Kidney, liver, great.  Iron and iron stores look great.

## 2020-06-03 NOTE — Telephone Encounter (Signed)
Otis,   You do have antibodies against covid.  Kidney, liver, and vitamin D look great.  Other labs pending.

## 2020-06-16 ENCOUNTER — Encounter: Payer: Self-pay | Admitting: Physician Assistant

## 2020-06-17 NOTE — Telephone Encounter (Signed)
For antibiotics need appt ok to be virtual this afternoon.

## 2020-07-03 ENCOUNTER — Other Ambulatory Visit: Payer: Self-pay | Admitting: Physician Assistant

## 2020-07-18 ENCOUNTER — Telehealth: Payer: Self-pay | Admitting: Physician Assistant

## 2020-07-18 NOTE — Telephone Encounter (Signed)
We cannot just give out antibiotics without appts. Ok to schedule tomorrow.

## 2020-07-18 NOTE — Telephone Encounter (Signed)
Please schedule patient for appt

## 2020-07-18 NOTE — Telephone Encounter (Signed)
Pt called.  She wants refill on her Alprazolam and a rx for her Diverticulitis.  Thank you.

## 2020-07-21 NOTE — Telephone Encounter (Signed)
Appointment has been scheduled.

## 2020-07-22 ENCOUNTER — Encounter: Payer: Self-pay | Admitting: Physician Assistant

## 2020-07-22 ENCOUNTER — Telehealth (INDEPENDENT_AMBULATORY_CARE_PROVIDER_SITE_OTHER): Payer: Medicare Other | Admitting: Physician Assistant

## 2020-07-22 VITALS — Ht 64.0 in | Wt 121.0 lb

## 2020-07-22 DIAGNOSIS — Z8719 Personal history of other diseases of the digestive system: Secondary | ICD-10-CM | POA: Diagnosis not present

## 2020-07-22 DIAGNOSIS — R634 Abnormal weight loss: Secondary | ICD-10-CM | POA: Diagnosis not present

## 2020-07-22 DIAGNOSIS — R1030 Lower abdominal pain, unspecified: Secondary | ICD-10-CM | POA: Diagnosis not present

## 2020-07-22 HISTORY — DX: Abnormal weight loss: R63.4

## 2020-07-22 MED ORDER — FLUCONAZOLE 150 MG PO TABS: 150.0000 mg | ORAL_TABLET | Freq: Once | ORAL | 0 refills | Status: AC

## 2020-07-22 MED ORDER — AMOXICILLIN-POT CLAVULANATE 875-125 MG PO TABS: 1.0000 | ORAL_TABLET | Freq: Two times a day (BID) | ORAL | 0 refills | Status: AC
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
## 2020-07-22 NOTE — Progress Notes (Signed)
Started 3 days ago Abdominal pain Back pain Nausea, no vomiting  Taking probiotics - helping some   Patient losing weight, she is worried about it  Weight today 121 lbs (patient reported) Last weight 131 lbs

## 2020-07-22 NOTE — Patient Instructions (Signed)

## 2020-08-17 ENCOUNTER — Other Ambulatory Visit: Payer: Self-pay

## 2020-08-17 ENCOUNTER — Encounter: Payer: Self-pay | Admitting: Physician Assistant

## 2020-08-17 ENCOUNTER — Ambulatory Visit (INDEPENDENT_AMBULATORY_CARE_PROVIDER_SITE_OTHER): Payer: Medicare Other | Admitting: Physician Assistant

## 2020-08-17 VITALS — BP 113/69 | HR 99 | Wt 123.4 lb

## 2020-08-17 DIAGNOSIS — H8111 Benign paroxysmal vertigo, right ear: Secondary | ICD-10-CM | POA: Diagnosis not present

## 2020-08-17 DIAGNOSIS — L650 Telogen effluvium: Secondary | ICD-10-CM | POA: Diagnosis not present

## 2020-08-17 DIAGNOSIS — Z8616 Personal history of COVID-19: Secondary | ICD-10-CM | POA: Diagnosis not present

## 2020-08-17 DIAGNOSIS — R300 Dysuria: Secondary | ICD-10-CM | POA: Diagnosis not present

## 2020-08-17 DIAGNOSIS — R110 Nausea: Secondary | ICD-10-CM

## 2020-08-17 DIAGNOSIS — H6123 Impacted cerumen, bilateral: Secondary | ICD-10-CM

## 2020-08-17 DIAGNOSIS — R634 Abnormal weight loss: Secondary | ICD-10-CM | POA: Diagnosis not present

## 2020-08-17 LAB — POCT URINALYSIS DIP (CLINITEK)
Bilirubin, UA: NEGATIVE
Blood, UA: NEGATIVE
Glucose, UA: NEGATIVE mg/dL
Ketones, POC UA: NEGATIVE mg/dL
Leukocytes, UA: NEGATIVE
Nitrite, UA: NEGATIVE
POC PROTEIN,UA: NEGATIVE
Spec Grav, UA: 1.015 (ref 1.010–1.025)
Urobilinogen, UA: 0.2 E.U./dL
pH, UA: 7 (ref 5.0–8.0)

## 2020-08-17 MED ORDER — MECLIZINE HCL 25 MG PO TABS: 25.0000 mg | ORAL_TABLET | Freq: Three times a day (TID) | ORAL | 2 refills | Status: DC | PRN

## 2020-08-17 NOTE — Progress Notes (Signed)
Jamie [name],   UA looks great.
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
## 2020-08-17 NOTE — Patient Instructions (Signed)
Benign Positional Vertigo Vertigo is the feeling that you or your surroundings are moving when they are not. Benign positional vertigo is the most common form of vertigo. This is usually a harmless condition (benign). This condition is positional. This means that symptoms are triggered by certain movements and positions. This condition can be dangerous if it occurs while you are doing something that could cause harm to you or others. This includes activities such as driving or operating machinery. What are the causes? In many cases, the cause of this condition is not known. It may be caused by a disturbance in an area of the inner ear that helps your brain to sense movement and balance. This disturbance can be caused by:  Viral infection (labyrinthitis).  Head injury.  Repetitive motion, such as jumping, dancing, or running. What increases the risk? You are more likely to develop this condition if:  You are a woman.  You are 50 years of age or older. What are the signs or symptoms? Symptoms of this condition usually happen when you move your head or your eyes in different directions. Symptoms may start suddenly, and usually last for less than a minute. They include:  Loss of balance and falling.  Feeling like you are spinning or moving.  Feeling like your surroundings are spinning or moving.  Nausea and vomiting.  Blurred vision.  Dizziness.  Involuntary eye movement (nystagmus). Symptoms can be mild and cause only minor problems, or they can be severe and interfere with daily life. Episodes of benign positional vertigo may return (recur) over time. Symptoms may improve over time. How is this diagnosed? This condition may be diagnosed based on:  Your medical history.  Physical exam of the head, neck, and ears.  Tests, such as: ? MRI. ? CT scan. ? Eye movement tests. Your health care provider may ask you to change positions quickly while he or she watches you for symptoms  of benign positional vertigo, such as nystagmus. Eye movement may be tested with a variety of exams that are designed to evaluate or stimulate vertigo. ? An electroencephalogram (EEG). This records electrical activity in your brain. ? Hearing tests. You may be referred to a health care provider who specializes in ear, nose, and throat (ENT) problems (otolaryngologist) or a provider who specializes in disorders of the nervous system (neurologist). How is this treated?  This condition may be treated in a session in which your health care provider moves your head in specific positions to adjust your inner ear back to normal. Treatment for this condition may take several sessions. Surgery may be needed in severe cases, but this is rare. In some cases, benign positional vertigo may resolve on its own in 2-4 weeks. Follow these instructions at home: Safety  Move slowly. Avoid sudden body or head movements or certain positions, as told by your health care provider.  Avoid driving until your health care provider says it is safe for you to do so.  Avoid operating heavy machinery until your health care provider says it is safe for you to do so.  Avoid doing any tasks that would be dangerous to you or others if vertigo occurs.  If you have trouble walking or keeping your balance, try using a cane for stability. If you feel dizzy or unstable, sit down right away.  Return to your normal activities as told by your health care provider. Ask your health care provider what activities are safe for you. General instructions  Take over-the-counter   and prescription medicines only as told by your health care provider.  Drink enough fluid to keep your urine pale yellow.  Keep all follow-up visits as told by your health care provider. This is important. Contact a health care provider if:  You have a fever.  Your condition gets worse or you develop new symptoms.  Your family or friends notice any  behavioral changes.  You have nausea or vomiting that gets worse.  You have numbness or a "pins and needles" sensation. Get help right away if you:  Have difficulty speaking or moving.  Are always dizzy.  Faint.  Develop severe headaches.  Have weakness in your legs or arms.  Have changes in your hearing or vision.  Develop a stiff neck.  Develop sensitivity to light. Summary  Vertigo is the feeling that you or your surroundings are moving when they are not. Benign positional vertigo is the most common form of vertigo.  The cause of this condition is not known. It may be caused by a disturbance in an area of the inner ear that helps your brain to sense movement and balance.  Symptoms include loss of balance and falling, feeling that you or your surroundings are moving, nausea and vomiting, and blurred vision.  This condition can be diagnosed based on symptoms, physical exam, and other tests, such as MRI, CT scan, eye movement tests, and hearing tests.  Follow safety instructions as told by your health care provider. You will also be told when to contact your health care provider in case of problems. This information is not intended to replace advice given to you by your health care provider. Make sure you discuss any questions you have with your health care provider. Document Revised: 01/08/2018 Document Reviewed: 01/08/2018 Elsevier Patient Education  2020 [employer]  

## 2020-08-18 LAB — CBC WITH DIFFERENTIAL/PLATELET
Basophils Absolute: 0.1 10*3/uL (ref 0.0–0.2)
Basos: 1 %
EOS (ABSOLUTE): 0 10*3/uL (ref 0.0–0.4)
Eos: 1 %
Hematocrit: 38.2 % (ref 34.0–46.6)
Hemoglobin: 13 g/dL (ref 11.1–15.9)
Immature Grans (Abs): 0 10*3/uL (ref 0.0–0.1)
Immature Granulocytes: 0 %
Lymphocytes Absolute: 1.1 10*3/uL (ref 0.7–3.1)
Lymphs: 22 %
MCH: 31.2 pg (ref 26.6–33.0)
MCHC: 34 g/dL (ref 31.5–35.7)
MCV: 92 fL (ref 79–97)
Monocytes Absolute: 0.4 10*3/uL (ref 0.1–0.9)
Monocytes: 9 %
Neutrophils Absolute: 3.4 10*3/uL (ref 1.4–7.0)
Neutrophils: 67 %
Platelets: 302 10*3/uL (ref 150–450)
RBC: 4.17 x10E6/uL (ref 3.77–5.28)
RDW: 12 % (ref 11.7–15.4)
WBC: 5 10*3/uL (ref 3.4–10.8)

## 2020-08-18 LAB — B12 AND FOLATE PANEL
Folate: 12 ng/mL (ref >3.0)
Vitamin B-12: 355 pg/mL (ref 232–1245)

## 2020-08-18 LAB — IRON,TIBC AND FERRITIN PANEL
Ferritin: 75 ng/mL (ref 15–150)
Iron Saturation: 23 % (ref 15–55)
Iron: 64 ug/dL (ref 27–139)
Total Iron Binding Capacity: 280 ug/dL (ref 250–450)
UIBC: 216 ug/dL (ref 118–369)

## 2020-08-18 LAB — COMPREHENSIVE METABOLIC PANEL
ALT: 17 IU/L (ref 0–32)
AST: 23 IU/L (ref 0–40)
Albumin/Globulin Ratio: 2.3 — ABNORMAL HIGH (ref 1.2–2.2)
Albumin: 4.6 g/dL (ref 3.8–4.8)
Alkaline Phosphatase: 73 IU/L (ref 44–121)
BUN/Creatinine Ratio: 12 (ref 12–28)
BUN: 11 mg/dL (ref 8–27)
Bilirubin Total: 0.3 mg/dL (ref 0.0–1.2)
CO2: 25 mmol/L (ref 20–29)
Calcium: 9.8 mg/dL (ref 8.7–10.3)
Chloride: 102 mmol/L (ref 96–106)
Creatinine, Ser: 0.95 mg/dL (ref 0.57–1.00)
GFR calc Af Amer: 72 mL/min/{1.73_m2} (ref >59)
GFR calc non Af Amer: 62 mL/min/{1.73_m2} (ref >59)
Globulin, Total: 2 g/dL (ref 1.5–4.5)
Glucose: 69 mg/dL (ref 65–99)
Potassium: 4.7 mmol/L (ref 3.5–5.2)
Sodium: 143 mmol/L (ref 134–144)
Total Protein: 6.6 g/dL (ref 6.0–8.5)

## 2020-08-18 LAB — VITAMIN D 25 HYDROXY (VIT D DEFICIENCY, FRACTURES): Vit D, 25-Hydroxy: 60.2 ng/mL (ref 30.0–100.0)

## 2020-08-18 LAB — TSH: TSH: 2.4 u[IU]/mL (ref 0.450–4.500)

## 2020-08-19 ENCOUNTER — Encounter: Payer: Self-pay | Admitting: Physician Assistant

## 2020-08-19 DIAGNOSIS — L650 Telogen effluvium: Secondary | ICD-10-CM

## 2020-08-19 HISTORY — DX: Telogen effluvium: L65.0

## 2020-08-19 NOTE — Progress Notes (Signed)
Genni,   Hemoglobin great.  Iron stores great.  Kidney, liver, glucose look great.  Thyroid perfect.  Protein ratio up a hair but separate numbers look great.  Vitamin D wonderful. B12 lower normal scale. Take sublingual 1024mcg daily.

## 2020-08-21 ENCOUNTER — Other Ambulatory Visit: Payer: Self-pay | Admitting: Physician Assistant

## 2020-08-21 DIAGNOSIS — F419 Anxiety disorder, unspecified: Secondary | ICD-10-CM

## 2020-08-21 NOTE — Telephone Encounter (Signed)
Last written 02/16/2020 #60 with 2 refills Last appt 08/17/2020

## 2020-08-22 ENCOUNTER — Encounter: Payer: Self-pay | Admitting: Physician Assistant

## 2020-08-24 ENCOUNTER — Telehealth: Payer: Self-pay

## 2020-08-24 DIAGNOSIS — E162 Hypoglycemia, unspecified: Secondary | ICD-10-CM

## 2020-08-24 NOTE — Telephone Encounter (Signed)
Pt called about her glucose it was 69 she's not feeling well wants to know can she get a glucose monitor and will her insurance cover it.

## 2020-08-24 NOTE — Telephone Encounter (Signed)
Ok for glucose monitor for low blood sugars. Please instruct patient to eat small frequent meals.

## 2020-08-25 ENCOUNTER — Other Ambulatory Visit: Payer: Self-pay

## 2020-08-25 ENCOUNTER — Telehealth: Payer: Self-pay

## 2020-08-25 DIAGNOSIS — E162 Hypoglycemia, unspecified: Secondary | ICD-10-CM

## 2020-08-25 MED ORDER — BLOOD GLUCOSE METER KIT: PACK | 0 refills | Status: DC

## 2020-08-25 NOTE — Telephone Encounter (Signed)
Glucose monitor written, but printed.   Katrina - can you reorder so that you can fax to pharmacy?

## 2020-08-25 NOTE — Telephone Encounter (Signed)
[name] I order the rx.

## 2020-08-25 NOTE — Telephone Encounter (Signed)
Pt is requesting for blood work to be done she say she's not feeling like herself she wants another check on her glucose.

## 2020-08-25 NOTE — Addendum Note (Signed)
Addended byAnnamaria Helling on: 08/25/2020 01:19 PM   Modules accepted: Orders

## 2020-08-26 ENCOUNTER — Other Ambulatory Visit: Payer: Self-pay | Admitting: Physician Assistant

## 2020-08-26 NOTE — Telephone Encounter (Signed)
Pt. Is aware of the a1c and that the glucose monitor was send to the pharmacy as requested.

## 2020-08-26 NOTE — Telephone Encounter (Signed)
I ordered an a1c to get drawn which will be an average of your sugars over 3 months.

## 2020-08-27 LAB — IRON,TIBC AND FERRITIN PANEL
Ferritin: 86 ng/mL (ref 15–150)
Iron Saturation: 29 % (ref 15–55)
Iron: 89 ug/dL (ref 27–139)
Total Iron Binding Capacity: 312 ug/dL (ref 250–450)
UIBC: 223 ug/dL (ref 118–369)

## 2020-08-27 LAB — CMP14+EGFR
ALT: 18 IU/L (ref 0–32)
AST: 26 IU/L (ref 0–40)
Albumin/Globulin Ratio: 2.1 (ref 1.2–2.2)
Albumin: 4.9 g/dL — ABNORMAL HIGH (ref 3.8–4.8)
Alkaline Phosphatase: 75 IU/L (ref 44–121)
BUN/Creatinine Ratio: 16 (ref 12–28)
BUN: 13 mg/dL (ref 8–27)
Bilirubin Total: 0.5 mg/dL (ref 0.0–1.2)
CO2: 24 mmol/L (ref 20–29)
Calcium: 10 mg/dL (ref 8.7–10.3)
Chloride: 104 mmol/L (ref 96–106)
Creatinine, Ser: 0.81 mg/dL (ref 0.57–1.00)
GFR calc Af Amer: 87 mL/min/{1.73_m2} (ref >59)
GFR calc non Af Amer: 75 mL/min/{1.73_m2} (ref >59)
Globulin, Total: 2.3 g/dL (ref 1.5–4.5)
Glucose: 78 mg/dL (ref 65–99)
Potassium: 4.7 mmol/L (ref 3.5–5.2)
Sodium: 144 mmol/L (ref 134–144)
Total Protein: 7.2 g/dL (ref 6.0–8.5)

## 2020-08-27 LAB — CBC WITH DIFFERENTIAL/PLATELET
Basophils Absolute: 0.1 10*3/uL (ref 0.0–0.2)
Basos: 1 %
EOS (ABSOLUTE): 0.1 10*3/uL (ref 0.0–0.4)
Eos: 1 %
Hematocrit: 40.5 % (ref 34.0–46.6)
Hemoglobin: 13.5 g/dL (ref 11.1–15.9)
Immature Grans (Abs): 0 10*3/uL (ref 0.0–0.1)
Immature Granulocytes: 0 %
Lymphocytes Absolute: 1.4 10*3/uL (ref 0.7–3.1)
Lymphs: 32 %
MCH: 30.8 pg (ref 26.6–33.0)
MCHC: 33.3 g/dL (ref 31.5–35.7)
MCV: 93 fL (ref 79–97)
Monocytes Absolute: 0.4 10*3/uL (ref 0.1–0.9)
Monocytes: 10 %
Neutrophils Absolute: 2.4 10*3/uL (ref 1.4–7.0)
Neutrophils: 56 %
Platelets: 293 10*3/uL (ref 150–450)
RBC: 4.38 x10E6/uL (ref 3.77–5.28)
RDW: 12 % (ref 11.7–15.4)
WBC: 4.3 10*3/uL (ref 3.4–10.8)

## 2020-08-27 LAB — SARS-COV-2 SEMI-QUANTITATIVE TOTAL ANTIBODY, SPIKE
SARS-CoV-2 Semi-Quant Total Ab: 1491 U/mL (ref <0.8)
SARS-CoV-2 Spike Ab Interp: POSITIVE

## 2020-08-28 ENCOUNTER — Encounter: Payer: Self-pay | Admitting: Physician Assistant

## 2020-08-28 DIAGNOSIS — E162 Hypoglycemia, unspecified: Secondary | ICD-10-CM

## 2020-08-29 ENCOUNTER — Encounter: Payer: Self-pay | Admitting: Physician Assistant

## 2020-08-29 MED ORDER — DEXCOM G6 RECEIVER DEVI: 1.0000 | Freq: Four times a day (QID) | 0 refills | Status: DC | PRN

## 2020-08-29 MED ORDER — DEXCOM G6 SENSOR MISC: 1.0000 | Freq: Four times a day (QID) | 5 refills | Status: DC | PRN

## 2020-08-29 MED ORDER — DEXCOM G6 TRANSMITTER MISC: 1.0000 | Freq: Four times a day (QID) | 0 refills | Status: DC | PRN

## 2020-08-29 NOTE — Telephone Encounter (Signed)
Jamie [name] I sent Dexcom, but told patient it probably wouldn't be covered.

## 2020-08-31 ENCOUNTER — Other Ambulatory Visit: Payer: Self-pay

## 2020-08-31 MED ORDER — LANCETS 30G MISC: 99 refills | Status: DC

## 2020-08-31 MED ORDER — ACCU-CHEK GUIDE VI STRP: ORAL_STRIP | 99 refills | Status: DC

## 2020-08-31 MED ORDER — ACCU-CHEK GUIDE ME W/DEVICE KIT: PACK | 99 refills | Status: DC

## 2020-11-05 ENCOUNTER — Encounter: Payer: Self-pay | Admitting: Physician Assistant

## 2020-11-19 ENCOUNTER — Other Ambulatory Visit: Payer: Self-pay | Admitting: Physician Assistant

## 2020-11-19 DIAGNOSIS — F419 Anxiety disorder, unspecified: Secondary | ICD-10-CM

## 2020-12-07 ENCOUNTER — Other Ambulatory Visit: Payer: Self-pay

## 2020-12-07 ENCOUNTER — Encounter: Payer: Self-pay | Admitting: Physician Assistant

## 2020-12-07 ENCOUNTER — Ambulatory Visit (INDEPENDENT_AMBULATORY_CARE_PROVIDER_SITE_OTHER): Payer: Medicare Other | Admitting: Physician Assistant

## 2020-12-07 VITALS — BP 95/76 | HR 85 | Temp 98.3°F | Resp 20 | Ht 64.0 in | Wt 124.0 lb

## 2020-12-07 DIAGNOSIS — M8580 Other specified disorders of bone density and structure, unspecified site: Secondary | ICD-10-CM | POA: Diagnosis not present

## 2020-12-07 DIAGNOSIS — H6122 Impacted cerumen, left ear: Secondary | ICD-10-CM | POA: Diagnosis not present

## 2020-12-07 DIAGNOSIS — R252 Cramp and spasm: Secondary | ICD-10-CM

## 2020-12-07 DIAGNOSIS — J0100 Acute maxillary sinusitis, unspecified: Secondary | ICD-10-CM

## 2020-12-07 DIAGNOSIS — H9313 Tinnitus, bilateral: Secondary | ICD-10-CM | POA: Diagnosis not present

## 2020-12-07 MED ORDER — AMOXICILLIN-POT CLAVULANATE 875-125 MG PO TABS: 1.0000 | ORAL_TABLET | Freq: Two times a day (BID) | ORAL | 0 refills | Status: AC

## 2020-12-07 NOTE — Progress Notes (Signed)
l °

## 2020-12-07 NOTE — Patient Instructions (Addendum)
Will order ABI for legs.  Will order bone density.  Will refer to ENT. Start augmentin for 10 days.

## 2020-12-14 ENCOUNTER — Ambulatory Visit (INDEPENDENT_AMBULATORY_CARE_PROVIDER_SITE_OTHER): Payer: Medicare Other

## 2020-12-14 ENCOUNTER — Encounter: Payer: Self-pay | Admitting: Physician Assistant

## 2020-12-14 ENCOUNTER — Other Ambulatory Visit: Payer: Self-pay

## 2020-12-14 DIAGNOSIS — M8580 Other specified disorders of bone density and structure, unspecified site: Secondary | ICD-10-CM | POA: Diagnosis not present

## 2020-12-14 DIAGNOSIS — M810 Age-related osteoporosis without current pathological fracture: Secondary | ICD-10-CM | POA: Insufficient documentation

## 2020-12-14 HISTORY — DX: Age-related osteoporosis without current pathological fracture: M81.0

## 2020-12-14 NOTE — Progress Notes (Signed)
Jamie [name],   Your bone density went from -2.3 to -2.8 and transitioned you into osteoporotic range. I would like for you to consider medication that will increase your bone density. Lets schedule a telephone visit to discuss options.

## 2020-12-16 ENCOUNTER — Encounter: Payer: Self-pay | Admitting: Physician Assistant

## 2020-12-19 ENCOUNTER — Encounter: Payer: Self-pay | Admitting: Physician Assistant

## 2020-12-23 ENCOUNTER — Encounter: Payer: Self-pay | Admitting: Physician Assistant

## 2020-12-23 DIAGNOSIS — J0100 Acute maxillary sinusitis, unspecified: Secondary | ICD-10-CM

## 2020-12-23 DIAGNOSIS — H6122 Impacted cerumen, left ear: Secondary | ICD-10-CM

## 2020-12-23 DIAGNOSIS — R252 Cramp and spasm: Secondary | ICD-10-CM

## 2020-12-23 DIAGNOSIS — H9313 Tinnitus, bilateral: Secondary | ICD-10-CM | POA: Insufficient documentation

## 2020-12-23 HISTORY — DX: Tinnitus, bilateral: H93.13

## 2020-12-23 HISTORY — DX: Acute maxillary sinusitis, unspecified: J01.00

## 2020-12-23 HISTORY — DX: Impacted cerumen, left ear: H61.22

## 2020-12-23 HISTORY — DX: Cramp and spasm: R25.2
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
## 2020-12-27 ENCOUNTER — Telehealth: Payer: Self-pay | Admitting: Neurology

## 2020-12-27 NOTE — Telephone Encounter (Signed)
Patient left vm stating she had a missed call from our office about her vascular ultrasound. It doesn't look like any notes in system from scheduling. [name] - did you call patient?

## 2021-01-05 ENCOUNTER — Ambulatory Visit (HOSPITAL_COMMUNITY)
Admission: RE | Admit: 2021-01-05 | Discharge: 2021-01-05 | Disposition: A | Discharge status: Home / Self Care | Payer: Medicare Other | Source: Ambulatory Visit | Attending: Physician Assistant | Admitting: Physician Assistant

## 2021-01-05 ENCOUNTER — Other Ambulatory Visit: Payer: Self-pay

## 2021-01-05 DIAGNOSIS — R252 Cramp and spasm: Secondary | ICD-10-CM

## 2021-01-05 NOTE — Progress Notes (Signed)
ABI exam has been completed.   Preliminary results can be found under CV PROC. Jamie [name], RVT/RDMS

## 2021-01-06 NOTE — Progress Notes (Signed)
[place],   Normal ABI testing. No signs of peripheral artery disease.

## 2021-01-10 ENCOUNTER — Other Ambulatory Visit: Payer: Self-pay | Admitting: Physician Assistant

## 2021-01-10 ENCOUNTER — Other Ambulatory Visit: Payer: Self-pay | Admitting: Family Medicine

## 2021-01-10 ENCOUNTER — Other Ambulatory Visit: Payer: Self-pay | Admitting: Neurology

## 2021-01-10 DIAGNOSIS — E559 Vitamin D deficiency, unspecified: Secondary | ICD-10-CM

## 2021-01-10 MED ORDER — GABAPENTIN 300 MG PO CAPS: ORAL_CAPSULE | ORAL | 1 refills | Status: DC

## 2021-02-03 ENCOUNTER — Other Ambulatory Visit: Payer: Self-pay | Admitting: Neurology

## 2021-02-03 DIAGNOSIS — R059 Cough, unspecified: Secondary | ICD-10-CM

## 2021-02-03 DIAGNOSIS — R079 Chest pain, unspecified: Secondary | ICD-10-CM

## 2021-02-03 DIAGNOSIS — R0602 Shortness of breath: Secondary | ICD-10-CM

## 2021-02-03 MED ORDER — ALBUTEROL SULFATE HFA 108 (90 BASE) MCG/ACT IN AERS: 2.0000 | INHALATION_SPRAY | Freq: Four times a day (QID) | RESPIRATORY_TRACT | 0 refills | Status: DC | PRN

## 2021-02-07 ENCOUNTER — Encounter: Payer: Medicare Other | Admitting: Physician Assistant

## 2021-02-10 ENCOUNTER — Other Ambulatory Visit: Payer: Self-pay

## 2021-02-10 ENCOUNTER — Ambulatory Visit (INDEPENDENT_AMBULATORY_CARE_PROVIDER_SITE_OTHER): Payer: Medicare Other | Admitting: Physician Assistant

## 2021-02-10 ENCOUNTER — Encounter: Payer: Self-pay | Admitting: Physician Assistant

## 2021-02-10 VITALS — BP 105/58 | HR 82 | Wt 130.0 lb

## 2021-02-10 DIAGNOSIS — E538 Deficiency of other specified B group vitamins: Secondary | ICD-10-CM

## 2021-02-10 DIAGNOSIS — I6523 Occlusion and stenosis of bilateral carotid arteries: Secondary | ICD-10-CM

## 2021-02-10 DIAGNOSIS — Z13220 Encounter for screening for lipoid disorders: Secondary | ICD-10-CM | POA: Diagnosis not present

## 2021-02-10 DIAGNOSIS — R790 Abnormal level of blood mineral: Secondary | ICD-10-CM

## 2021-02-10 DIAGNOSIS — M810 Age-related osteoporosis without current pathological fracture: Secondary | ICD-10-CM

## 2021-02-10 DIAGNOSIS — Z8616 Personal history of COVID-19: Secondary | ICD-10-CM | POA: Diagnosis not present

## 2021-02-10 DIAGNOSIS — Z0000 Encounter for general adult medical examination without abnormal findings: Secondary | ICD-10-CM | POA: Diagnosis not present

## 2021-02-10 DIAGNOSIS — D510 Vitamin B12 deficiency anemia due to intrinsic factor deficiency: Secondary | ICD-10-CM

## 2021-02-10 DIAGNOSIS — D508 Other iron deficiency anemias: Secondary | ICD-10-CM

## 2021-02-10 DIAGNOSIS — E559 Vitamin D deficiency, unspecified: Secondary | ICD-10-CM

## 2021-02-10 DIAGNOSIS — Z131 Encounter for screening for diabetes mellitus: Secondary | ICD-10-CM

## 2021-02-10 NOTE — Progress Notes (Signed)
Pt reports that she has questions. She was advised that this was a CPE and that her insurance would NOT cover any problem based issues that she may have and that she WOULD be charged for this. She voiced understanding.

## 2021-02-10 NOTE — Patient Instructions (Addendum)
Osteoporosis Osteoporosis is when the bones get thin and weak. This can cause your bones to break (fracture) more easily. What are the causes? The exact cause of this condition is not known. What increases the risk? Having family members with this condition. Not eating enough healthy foods. Taking certain medicines. Being female. Being age 68 or older. Smoking or using other products that contain nicotine or tobacco, such as e-cigarettes or chewing tobacco. Not exercising. Being of European or Asian ancestry. Having a small body frame. What are the signs or symptoms? A broken bone might be the first sign, especially if the break results from a fall or injury that usually would not cause a bone to break. Other signs and symptoms include: Pain in the neck or low back. Being hunched over (stooped posture). Getting shorter. How is this treated? Eating more foods with more calcium and vitamin D in them. Doing exercises. Stopping tobacco use. Limiting how much alcohol you drink. Taking medicines to slow bone loss or help make the bones stronger. Taking supplements of calcium and vitamin D every day. Taking medicines to replace chemicals in the body (hormone replacement medicines). Monitoring your levels of calcium and vitamin D. The goal of treatment is to strengthen your bones and lower your risk for a bone break. Follow these instructions at home: Eating and drinking Eat plenty of calcium and vitamin D. These nutrients are good for your bones. Good sources of calcium and vitamin D include: Some fish, such as salmon and tuna. Foods that have calcium and vitamin D added to them (fortified foods), such as some breakfast cereals. Egg yolks. Cheese. Liver.  Activity Do exercises as told by your doctor. Ask your doctor what exercises are safe for you. You should do: Exercises that make your muscles work to hold your body weight up (weight-bearing exercises). These include tai chi,  yoga, and walking. Exercises to make your muscles stronger. One example is lifting weights. Lifestyle Do not drink alcohol if: Your doctor tells you not to drink. You are pregnant, may be pregnant, or are planning to become pregnant. If you drink alcohol: Limit how much you use to: 0-1 drink a day for women. 0-2 drinks a day for men. Know how much alcohol is in your drink. In the [place] one drink equals one 12 oz bottle of beer (355 mL), one 5 oz glass of wine (148 mL), or one 1 oz glass of hard liquor (44 mL). Do not smoke or use any products that contain nicotine or tobacco. If you need help quitting, ask your doctor. Preventing falls Use tools to help you move around (mobility aids) as needed. These include canes, walkers, scooters, and crutches. Keep rooms well-lit. Put away things on the floor that could make you trip. These include cords and rugs. Install safety rails on stairs. Install grab bars in bathrooms. Use rubber mats in slippery areas, like bathrooms. Wear shoes that: Fit you well. Support your feet. Have closed toes. Have rubber soles or low heels. Tell your doctor about all of the medicines you are taking. Some medicines can make you more likely to fall. General instructions Take over-the-counter and prescription medicines only as told by your doctor. Keep all follow-up visits. Contact a doctor if: You have not been tested (screened) for osteoporosis and you are: A woman who is age 65 or older. A man who is age 70 or older. Get help right away if: You fall. You get hurt. Summary Osteoporosis happens when your bones   thin and weak. Weak bones can break (fracture) more easily. Eat plenty of calcium and vitamin D. These are good for your bones. Tell your doctor about all of the medicines that you take. This information is not intended to replace advice given to you by your health care provider. Make sure you discuss any questions you have with your healthcare  provider. Document Revised: 01/14/2020 Document Reviewed: 01/14/2020 Elsevier Patient Education  [place]. Alendronate Tablets What is this medication? ALENDRONATE (a LEN droe nate) prevents and treats osteoporosis. It may also be used to treat Paget disease of the bone. It works by Paramedic stronger and less likely to break (fracture). It belongs to a group of medicationscalled bisphosphonates. This medicine may be used for other purposes; ask your health care provider orpharmacist if you have questions. COMMON BRAND NAME(S): Fosamax What should I tell my care team before I take this medication? They need to know if you have any of these conditions: Bleeding disorder Cancer Dental disease Difficulty swallowing Infection (fever, chills, cough, sore throat, pain or trouble passing urine) Kidney disease Low levels of calcium or other minerals in the blood Low red blood cell counts Receiving steroids like dexamethasone or prednisone Stomach or intestine problems Trouble sitting or standing for 30 minutes An unusual or allergic reaction to alendronate, other medications, foods, dyes or preservatives Pregnant or trying to get pregnant Breast-feeding How should I use this medication? Take this medication by mouth with a full glass of water. Take it as directed on the prescription label at the same time every day. Take the dose right after waking up. Do not eat or drink anything before taking it. Do not take it with any other drink except water. Do not chew or crush the tablet. After taking it, do not eat breakfast, drink, or take any other medications or vitamins for at least 30 minutes. Sit or stand up for at least 30 minutes after you take it. Donot lie down. Keep taking it unless your care team tells you to stop. A special MedGuide will be given to you by the pharmacist with eachprescription and refill. Be sure to read this information carefully each time. Talk to your care  team about the use of this medication in children. Specialcare may be needed. Overdosage: If you think you have taken too much of this medicine contact apoison control center or emergency room at once. NOTE: This medicine is only for you. Do not share this medicine with others. What if I miss a dose? If you take your medication once a day, skip it. Take your next dose at thescheduled time the next morning. Do not take two doses on the same day. If you take your medication once a week, take the missed dose on the morningafter you remember. Do not take two doses on the same day. What may interact with this medication? Aluminum hydroxide Antacids Aspirin Calcium supplements Medications for inflammation like ibuprofen, naproxen, and others Iron supplements Magnesium supplements Vitamins with minerals This list may not describe all possible interactions. Give your health care provider a list of all the medicines, herbs, non-prescription drugs, or dietary supplements you use. Also tell them if you smoke, drink alcohol, or use illegaldrugs. Some items may interact with your medicine. What should I watch for while using this medication? Visit your care team for regular checks on your progress. It may be some timebefore you see the benefit from this medication. Some people who take this medication have  severe bone, joint, or muscle pain. This medication may also increase your risk for jaw problems or a broken thigh bone. Tell your care team right away if you have severe pain in your jaw, bones, joints, or muscles. Tell you care team if you have any pain that doesnot go away or that gets worse. Tell your dentist and dental surgeon that you are taking this medication. You should not have major dental surgery while on this medication. See your dentist to have a dental exam and fix any dental problems before starting this medication. Take good care of your teeth while on this medication. Make sureyou see your  dentist for regular follow-up appointments. You should make sure you get enough calcium and vitamin D while you are taking this medication. Discuss the foods you eat and the vitamins you take with yourcare team. You may need blood work done while you are taking this medication. What side effects may I notice from receiving this medication? Side effects that you should report to your care team as soon as possible: Allergic reactions-skin rash, itching, hives, swelling of the face, lips, tongue, or throat Low calcium level-muscle pain or cramps, confusion, tingling, or numbness in the hands or feet Osteonecrosis of the jaw-pain, swelling, or redness in the mouth, numbness of the jaw, poor healing after dental work, unusual discharge from the mouth, visible bones in the mouth Pain or trouble swallowing Severe bone, joint, or muscle pain Stomach bleeding-bloody or black, tar-like stools, vomiting blood or brown material that looks like coffee grounds Side effects that usually do not require medical attention (report to your careteam if they continue or are bothersome): Constipation Diarrhea Nausea Stomach pain This list may not describe all possible side effects. Call your doctor for medical advice about side effects. You may report side effects to FDA at1-800-FDA-1088. Where should I keep my medication? Keep out of the reach of children and pets. Store at room temperature between 15 and 30 degrees C (59 and 86 degrees F).Throw away any unused medication after the expiration date. NOTE: This sheet is a summary. It may not cover all possible information. If you have questions about this medicine, talk to your doctor, pharmacist, orhealth care provider.  2022 Elsevier/Gold Standard (2020-08-11 10:35:10) Denosumab injection What is this medication? DENOSUMAB (den oh sue mab) slows bone breakdown. Prolia is used to treat osteoporosis in women after menopause and in men, and in people who are taking  corticosteroids for 6 months or more. Delton See is used to treat a high calcium level due to cancer and to prevent bone fractures and other bone problems caused by multiple myeloma or cancer bone metastases. Delton See is also used totreat giant cell tumor of the bone. This medicine may be used for other purposes; ask your health care provider orpharmacist if you have questions. COMMON BRAND NAME(S): Prolia, XGEVA What should I tell my care team before I take this medication? They need to know if you have any of these conditions: dental disease having surgery or tooth extraction infection kidney disease low levels of calcium or Vitamin D in the blood malnutrition on hemodialysis skin conditions or sensitivity thyroid or parathyroid disease an unusual reaction to denosumab, other medicines, foods, dyes, or preservatives pregnant or trying to get pregnant breast-feeding How should I use this medication? This medicine is for injection under the skin. It is given by a health careprofessional in a hospital or clinic setting. A special MedGuide will be given to you before each treatment.  Be sure to readthis information carefully each time. For Prolia, talk to your pediatrician regarding the use of this medicine in children. Special care may be needed. For Delton See, talk to your pediatrician regarding the use of this medicine in children. While this drug may be prescribed for children as young as 13 years for selected conditions,precautions do apply. Overdosage: If you think you have taken too much of this medicine contact apoison control center or emergency room at once. NOTE: This medicine is only for you. Do not share this medicine with others. What if I miss a dose? It is important not to miss your dose. Call your doctor or health careprofessional if you are unable to keep an appointment. What may interact with this medication? Do not take this medicine with any of the following medications: other  medicines containing denosumab This medicine may also interact with the following medications: medicines that lower your chance of fighting infection steroid medicines like prednisone or cortisone This list may not describe all possible interactions. Give your health care provider a list of all the medicines, herbs, non-prescription drugs, or dietary supplements you use. Also tell them if you smoke, drink alcohol, or use illegaldrugs. Some items may interact with your medicine. What should I watch for while using this medication? Visit your doctor or health care professional for regular checks on your progress. Your doctor or health care professional may order blood tests andother tests to see how you are doing. Call your doctor or health care professional for advice if you get a fever, chills or sore throat, or other symptoms of a cold or flu. Do not treat yourself. This drug may decrease your body's ability to fight infection. Try toavoid being around people who are sick. You should make sure you get enough calcium and vitamin D while you are taking this medicine, unless your doctor tells you not to. Discuss the foods you eatand the vitamins you take with your health care professional. See your dentist regularly. Brush and floss your teeth as directed. Before youhave any dental work done, tell your dentist you are receiving this medicine. Do not become pregnant while taking this medicine or for 5 months after stopping it. Talk with your doctor or health care professional about your birth control options while taking this medicine. Women should inform their doctor if they wish to become pregnant or think they might be pregnant. There is a potential for serious side effects to an unborn child. Talk to your health careprofessional or pharmacist for more information. What side effects may I notice from receiving this medication? Side effects that you should report to your doctor or health care  professionalas soon as possible: allergic reactions like skin rash, itching or hives, swelling of the face, lips, or tongue bone pain breathing problems dizziness jaw pain, especially after dental work redness, blistering, peeling of the skin signs and symptoms of infection like fever or chills; cough; sore throat; pain or trouble passing urine signs of low calcium like fast heartbeat, muscle cramps or muscle pain; pain, tingling, numbness in the hands or feet; seizures unusual bleeding or bruising unusually weak or tired Side effects that usually do not require medical attention (report to yourdoctor or health care professional if they continue or are bothersome): constipation diarrhea headache joint pain loss of appetite muscle pain runny nose tiredness upset stomach This list may not describe all possible side effects. Call your doctor for medical advice about side effects. You may report side effects to  FDA at1-800-FDA-1088. Where should I keep my medication? This medicine is only given in a clinic, doctor's office, or other health caresetting and will not be stored at home. NOTE: This sheet is a summary. It may not cover all possible information. If you have questions about this medicine, talk to your doctor, pharmacist, orhealth care provider.  2022 Elsevier/Gold Standard (2017-12-06 16:10:44)   Health Maintenance After Age 80 After age 57, you are at a higher risk for certain long-term diseases and infections as well as injuries from falls. Falls are a major cause of broken bones and head injuries in people who are older than age 46. Getting regular preventive care can help to keep you healthy and well. Preventive care includes getting regular testing and making lifestyle changes as recommended by your health care provider. Talk with your health care provider about: Which screenings and tests you should have. A screening is a test that checks for a disease when you have no  symptoms. A diet and exercise plan that is right for you. What should I know about screenings and tests to prevent falls? Screening and testing are the best ways to find a health problem early. Early diagnosis and treatment give you the best chance of managing medical conditions that are common after age 86. Certain conditions and lifestyle choices may make you more likely to have a fall. Your health care provider may recommend: Regular vision checks. Poor vision and conditions such as cataracts can make you more likely to have a fall. If you wear glasses, make sure to get your prescription updated if your vision changes. Medicine review. Work with your health care provider to regularly review all of the medicines you are taking, including over-the-counter medicines. Ask your health care provider about any side effects that may make you more likely to have a fall. Tell your health care provider if any medicines that you take make you feel dizzy or sleepy. Osteoporosis screening. Osteoporosis is a condition that causes the bones to get weaker. This can make the bones weak and cause them to break more easily. Blood pressure screening. Blood pressure changes and medicines to control blood pressure can make you feel dizzy. Strength and balance checks. Your health care provider may recommend certain tests to check your strength and balance while standing, walking, or changing positions. Foot health exam. Foot pain and numbness, as well as not wearing proper footwear, can make you more likely to have a fall. Depression screening. You may be more likely to have a fall if you have a fear of falling, feel emotionally low, or feel unable to do activities that you used to do. Alcohol use screening. Using too much alcohol can affect your balance and may make you more likely to have a fall. What actions can I take to lower my risk of falls? General instructions Talk with your health care provider about your risks  for falling. Tell your health care provider if: You fall. Be sure to tell your health care provider about all falls, even ones that seem minor. You feel dizzy, sleepy, or off-balance. Take over-the-counter and prescription medicines only as told by your health care provider. These include any supplements. Eat a healthy diet and maintain a healthy weight. A healthy diet includes low-fat dairy products, low-fat (lean) meats, and fiber from whole grains, beans, and lots of fruits and vegetables. Home safety Remove any tripping hazards, such as rugs, cords, and clutter. Install safety equipment such as grab bars in bathrooms  and safety rails on stairs. Keep rooms and walkways well-lit. Activity  Follow a regular exercise program to stay fit. This will help you maintain your balance. Ask your health care provider what types of exercise are appropriate for you. If you need a cane or walker, use it as recommended by your health care provider. Wear supportive shoes that have nonskid soles.  Lifestyle Do not drink alcohol if your health care provider tells you not to drink. If you drink alcohol, limit how much you have: 0-1 drink a day for women. 0-2 drinks a day for men. Be aware of how much alcohol is in your drink. In the [place] one drink equals one typical bottle of beer (12 oz), one-half glass of wine (5 oz), or one shot of hard liquor (1 oz). Do not use any products that contain nicotine or tobacco, such as cigarettes and e-cigarettes. If you need help quitting, ask your health care provider. Summary Having a healthy lifestyle and getting preventive care can help to protect your health and wellness after age 79. Screening and testing are the best way to find a health problem early and help you avoid having a fall. Early diagnosis and treatment give you the best chance for managing medical conditions that are more common for people who are older than age 2. Falls are a major cause of broken  bones and head injuries in people who are older than age 58. Take precautions to prevent a fall at home. Work with your health care provider to learn what changes you can make to improve your health and wellness and to prevent falls. This information is not intended to replace advice given to you by your health care provider. Make sure you discuss any questions you have with your healthcare provider. Document Revised: 07/15/2020 Document Reviewed: 07/15/2020 Elsevier Patient Education  2022 [employer].

## 2021-02-10 NOTE — Progress Notes (Signed)
rSubjective:     Jamie [name] is a 70 y.o. female and is here for a comprehensive physical exam. The patient reports no problems.   Social History   Socioeconomic History   Marital status: Divorced    Spouse name: Not on file   Number of children: 3   Years of education: Not on file   Highest education level: Not on file  Occupational History   Occupation: Student  Tobacco Use   Smoking status: Former    Packs/day: 0.80    Years: 20.00    Pack years: 16.00    Types: Cigarettes    Quit date: 08/13/1985    Years since quitting: 35.5   Smokeless tobacco: Never  Vaping Use   Vaping Use: Never used  Substance and Sexual Activity   Alcohol use: Yes    Comment: rare   Drug use: No   Sexual activity: Not Currently    Partners: Male    Birth control/protection: None  Other Topics Concern   Not on file  Social History Narrative   Lives with and grandchild (she is foster parent for grand child) Completed school full time.   Caffeine use; occasional   Works in Personal assistant, Economist.   In process of divorce.        Social Determinants of Health   Financial Resource Strain: Not on file  Food Insecurity: Not on file  Transportation Needs: Not on file  Physical Activity: Not on file  Stress: Not on file  Social Connections: Not on file  Intimate Partner Violence: Not on file   Health Maintenance  Topic Date Due   Zoster Vaccines- Shingrix (1 of 2) 05/13/2021 (Originally 12/20/1969)   TETANUS/TDAP  02/10/2022 (Originally 08/14/2015)   PNA vac Low Risk Adult (1 of 2 - PCV13) 02/10/2022 (Originally 12/19/2017)   INFLUENZA VACCINE  03/13/2021   MAMMOGRAM  04/12/2022   DEXA SCAN  12/15/2022   COLONOSCOPY (Pts 45-27yr Insurance coverage will need to be confirmed)  03/21/2027   Hepatitis C Screening  Completed   HPV VACCINES  Aged Out   COVID-19 Vaccine  Discontinued    The following portions of the patient's history were reviewed and updated as appropriate:  allergies, current medications, past family history, past medical history, past social history, past surgical history, and problem list.  Review of Systems A comprehensive review of systems was negative.   Objective:    BP (!) 105/58   Pulse 82   Wt 130 lb (59 kg)   SpO2 100%   BMI 22.31 kg/m  General appearance: alert, cooperative, and appears stated age Head: Normocephalic, without obvious abnormality, atraumatic Eyes: conjunctivae/corneas clear. PERRL, EOM's intact. Fundi benign. Ears: normal TM's and external ear canals both ears Nose: Nares normal. Septum midline. Mucosa normal. No drainage or sinus tenderness. Throat: lips, mucosa, and tongue normal; teeth and gums normal Neck: no adenopathy, no carotid bruit, no JVD, supple, symmetrical, trachea midline, and thyroid not enlarged, symmetric, no tenderness/mass/nodules Back: symmetric, no curvature. ROM normal. No CVA tenderness. Lungs: clear to auscultation bilaterally Heart: regular rate and rhythm, S1, S2 normal, no murmur, click, rub or gallop Abdomen: soft, non-tender; bowel sounds normal; no masses,  no organomegaly Extremities: extremities normal, atraumatic, no cyanosis or edema Pulses: 2+ and symmetric Skin: Skin color, texture, turgor normal. No rashes or lesions Lymph nodes: Cervical, supraclavicular, and axillary nodes normal. Neurologic: Grossly normal   .. [facility] 02/16/2021 02/02/2020 02/02/2020 11/19/2018 09/30/2018  Decreased Interest 1 0 '1 3 2  ' Down, Depressed, Hopeless 1 0 '2 3 1  ' PHQ - 2 Score 2 0 '3 6 3  ' Altered sleeping '1 1 1 3 1  ' Tired, decreased energy 1 0 '1 3 2  ' Change in appetite 1 0 0 3 2  Feeling bad or failure about yourself  1 0 0 0 1  Trouble concentrating 1 0 0 3 1  Moving slowly or fidgety/restless 0 0 0 0 2  Suicidal thoughts 0 0 0 0 0  PHQ-9 Score '7 1 5 18 12  ' Difficult doing work/chores Somewhat difficult Not difficult at all - Not difficult at all Somewhat difficult  Some  recent data might be hidden   .Marland Kitchen GAD 7 : Generalized Anxiety Score 02/16/2021 02/02/2020 02/02/2020 11/19/2018  Nervous, Anxious, on Edge '1 1 2 3  ' Control/stop worrying 1 0 2 3  Worry too much - different things 1 0 2 3  Trouble relaxing 1 0 2 3  Restless 1 0 1 0  Easily annoyed or irritable - '1 2 3  ' Afraid - awful might happen 1 0 1 3  Total GAD 7 Score - '2 12 18  ' Anxiety Difficulty Somewhat difficult Not difficult at all - Not difficult at all     Assessment:    Healthy female exam.     Plan:    Marland KitchenMarland KitchenTeletha was seen today for annual exam.  Diagnoses and all orders for this visit:  Routine physical examination -     Comprehensive metabolic panel -     Lipid Panel w/o Chol/HDL Ratio -     B12 and Folate Panel -     Sed Rate (ESR) -     CBC w/Diff/Platelet -     VITAMIN D 25 Hydroxy (Vit-D Deficiency, Fractures) -     SARS-CoV-2 Antibodies -     C-reactive protein -     TSH -     Fe+TIBC+Fer  Age-related osteoporosis without current pathological fracture -     VITAMIN D 25 Hydroxy (Vit-D Deficiency, Fractures)  History of COVID-19 -     SARS-CoV-2 Antibodies  Screening for lipid disorders -     Lipid Panel w/o Chol/HDL Ratio  Screening for diabetes mellitus -     Comprehensive metabolic panel  Low serum vitamin B12 -     B12 and Folate Panel  Vitamin D insufficiency -     VITAMIN D 25 Hydroxy (Vit-D Deficiency, Fractures)  Iron deficiency anemia secondary to inadequate dietary iron intake -     CBC w/Diff/Platelet -     Fe+TIBC+Fer  Low iron stores -     CBC w/Diff/Platelet -     Fe+TIBC+Fer  Pernicious anemia -     B12 and Folate Panel  Carotid stenosis, asymptomatic, bilateral -     US Carotid Duplex Bilateral; Future  .Marland Kitchen Discussed 150 minutes of exercise a week.  Encouraged vitamin D 1000 units and Calcium 1391m or 4 servings of dairy a day.  Fasting labs ordered with [facility].  PHQ/GAD not to goal. Pt declines any treatment. Xanax as needed.   Colonoscopy UTD.  Mammogram UTD.  Pap not indicated.  Discussed shingles at pharmacy.  Declined covid,pneumonia.   No signifcant carotid stenosis but request yearly check. Ordered today.  Reassured ABI were normal.   Discussed osteoporosis risk and treatment.  Encouraged prolia. HO given for all options.  Pt will review information and follow up.  See After Visit Summary for  Counseling Recommendations

## 2021-02-14 ENCOUNTER — Other Ambulatory Visit: Payer: Self-pay | Admitting: Physician Assistant

## 2021-02-15 ENCOUNTER — Other Ambulatory Visit: Payer: Self-pay | Admitting: Physician Assistant

## 2021-02-15 DIAGNOSIS — F419 Anxiety disorder, unspecified: Secondary | ICD-10-CM

## 2021-02-16 ENCOUNTER — Encounter: Payer: Self-pay | Admitting: Physician Assistant

## 2021-02-16 NOTE — Telephone Encounter (Signed)
Last appt 02/10/2021 Last written 11/21/2020 #60 no refills

## 2021-02-20 LAB — COMPREHENSIVE METABOLIC PANEL
ALT: 18 IU/L (ref 0–32)
AST: 24 IU/L (ref 0–40)
Albumin/Globulin Ratio: 2.1 (ref 1.2–2.2)
Albumin: 4.6 g/dL (ref 3.8–4.8)
Alkaline Phosphatase: 77 IU/L (ref 44–121)
BUN/Creatinine Ratio: 14 (ref 12–28)
BUN: 12 mg/dL (ref 8–27)
Bilirubin Total: 0.6 mg/dL (ref 0.0–1.2)
CO2: 26 mmol/L (ref 20–29)
Calcium: 9.4 mg/dL (ref 8.7–10.3)
Chloride: 98 mmol/L (ref 96–106)
Creatinine, Ser: 0.88 mg/dL (ref 0.57–1.00)
Globulin, Total: 2.2 g/dL (ref 1.5–4.5)
Glucose: 86 mg/dL (ref 65–99)
Potassium: 4.4 mmol/L (ref 3.5–5.2)
Sodium: 139 mmol/L (ref 134–144)
Total Protein: 6.8 g/dL (ref 6.0–8.5)
eGFR: 72 mL/min/{1.73_m2} (ref >59)

## 2021-02-20 LAB — B12 AND FOLATE PANEL
Folate: 9.8 ng/mL (ref >3.0)
Vitamin B-12: 228 pg/mL — ABNORMAL LOW (ref 232–1245)

## 2021-02-20 LAB — CBC WITH DIFFERENTIAL/PLATELET
Basophils Absolute: 0.1 10*3/uL (ref 0.0–0.2)
Basos: 2 %
EOS (ABSOLUTE): 0.1 10*3/uL (ref 0.0–0.4)
Eos: 1 %
Hematocrit: 37.5 % (ref 34.0–46.6)
Hemoglobin: 12.9 g/dL (ref 11.1–15.9)
Immature Grans (Abs): 0 10*3/uL (ref 0.0–0.1)
Immature Granulocytes: 0 %
Lymphocytes Absolute: 1.4 10*3/uL (ref 0.7–3.1)
Lymphs: 35 %
MCH: 31.2 pg (ref 26.6–33.0)
MCHC: 34.4 g/dL (ref 31.5–35.7)
MCV: 91 fL (ref 79–97)
Monocytes Absolute: 0.4 10*3/uL (ref 0.1–0.9)
Monocytes: 10 %
Neutrophils Absolute: 2.1 10*3/uL (ref 1.4–7.0)
Neutrophils: 52 %
Platelets: 284 10*3/uL (ref 150–450)
RBC: 4.13 x10E6/uL (ref 3.77–5.28)
RDW: 11.8 % (ref 11.7–15.4)
WBC: 4 10*3/uL (ref 3.4–10.8)

## 2021-02-20 LAB — IRON,TIBC AND FERRITIN PANEL
Ferritin: 67 ng/mL (ref 15–150)
Iron Saturation: 31 % (ref 15–55)
Iron: 105 ug/dL (ref 27–139)
Total Iron Binding Capacity: 342 ug/dL (ref 250–450)
UIBC: 237 ug/dL (ref 118–369)

## 2021-02-20 LAB — LIPID PANEL W/O CHOL/HDL RATIO
Cholesterol, Total: 244 mg/dL — ABNORMAL HIGH (ref 100–199)
HDL: 78 mg/dL (ref >39)
LDL Chol Calc (NIH): 148 mg/dL — ABNORMAL HIGH (ref 0–99)
Triglycerides: 102 mg/dL (ref 0–149)
VLDL Cholesterol Cal: 18 mg/dL (ref 5–40)

## 2021-02-20 LAB — TSH: TSH: 4.96 u[IU]/mL — ABNORMAL HIGH (ref 0.450–4.500)

## 2021-02-20 LAB — C-REACTIVE PROTEIN: CRP: <1 mg/L (ref 0–10)

## 2021-02-20 LAB — SEDIMENTATION RATE: Sed Rate: 2 mm/hr (ref 0–40)

## 2021-02-20 LAB — VITAMIN D 25 HYDROXY (VIT D DEFICIENCY, FRACTURES): Vit D, 25-Hydroxy: 48.9 ng/mL (ref 30.0–100.0)

## 2021-02-20 LAB — SARS-COV-2 ANTIBODIES: SARS-CoV-2 Antibodies: POSITIVE

## 2021-02-20 NOTE — Progress Notes (Signed)
Kindall,   Kidney, liver, glucose look great.  LDL, bad cholesterol, continues to rise but your HDL, good cholesterol is fantastic.  To get LDL down some you could consider low dose statin. Thoughts?  Hemoglobin great.  Serum iron and iron stores look great.  Inflammation low.  B12 is low. Are you taking oral B12? Do you want to come in for shots?  Are you taking synthroid 44mcg daily in the morning without anything to eat or other medications? If so we need to increase to 1mcg to get you closer to optimal levels.  Vitamin d is normal range but down from 6 months ago.  You still have covid antibodies.

## 2021-02-21 ENCOUNTER — Encounter: Payer: Self-pay | Admitting: Physician Assistant

## 2021-02-21 ENCOUNTER — Other Ambulatory Visit: Payer: Self-pay | Admitting: Physician Assistant

## 2021-02-21 DIAGNOSIS — I728 Aneurysm of other specified arteries: Secondary | ICD-10-CM

## 2021-02-22 ENCOUNTER — Encounter: Payer: Self-pay | Admitting: Physician Assistant

## 2021-02-23 ENCOUNTER — Ambulatory Visit (INDEPENDENT_AMBULATORY_CARE_PROVIDER_SITE_OTHER): Payer: Medicare Other

## 2021-02-23 ENCOUNTER — Other Ambulatory Visit: Payer: Self-pay

## 2021-02-23 DIAGNOSIS — I6523 Occlusion and stenosis of bilateral carotid arteries: Secondary | ICD-10-CM

## 2021-02-23 DIAGNOSIS — I728 Aneurysm of other specified arteries: Secondary | ICD-10-CM

## 2021-02-24 ENCOUNTER — Encounter: Payer: Self-pay | Admitting: Physician Assistant

## 2021-02-24 NOTE — Progress Notes (Signed)
Diamond,   You have minimal plaque in carotid arteries at this time. GREAT News! You do not need yearly screenings to continue.

## 2021-02-26 ENCOUNTER — Encounter: Payer: Self-pay | Admitting: Physician Assistant

## 2021-02-26 DIAGNOSIS — Z0100 Encounter for examination of eyes and vision without abnormal findings: Secondary | ICD-10-CM

## 2021-02-27 NOTE — Telephone Encounter (Signed)
Ok for referral?

## 2021-02-28 ENCOUNTER — Telehealth: Payer: Self-pay

## 2021-02-28 ENCOUNTER — Ambulatory Visit (INDEPENDENT_AMBULATORY_CARE_PROVIDER_SITE_OTHER): Payer: Medicare Other | Admitting: Family Medicine

## 2021-02-28 ENCOUNTER — Other Ambulatory Visit: Payer: Self-pay

## 2021-02-28 VITALS — BP 115/66 | HR 88 | Ht 64.0 in | Wt 131.0 lb

## 2021-02-28 DIAGNOSIS — E538 Deficiency of other specified B group vitamins: Secondary | ICD-10-CM

## 2021-02-28 MED ORDER — CYANOCOBALAMIN 1000 MCG/ML IJ SOLN
1000.0000 ug | Freq: Once | INTRAMUSCULAR | Status: AC
  Administered 2021-02-28: 500 ug via INTRAMUSCULAR

## 2021-02-28 NOTE — Progress Notes (Signed)
Patient is here for a vitamin B12 injection. Denies GI problems or dizziness. States that previous injection pushed her B12 numbers to high and she was experiencing headaches. Pt is also taking B12 supplements at home which would contribute to this.   Pt states she is starting to use CBD with THC to help calm her.   Requested 1/2 of B12 injection instead of complete dose.  Location: LD  Patient tolerated injection well without complications.  Breeback: Patient is requesting labs to recheck B12 levels x 1 month.

## 2021-02-28 NOTE — Progress Notes (Signed)
Agree with documentation as above.   Jamie Zaldivar, MD  

## 2021-02-28 NOTE — Progress Notes (Signed)
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  Aortic plaque still visualized.  Diverticulosis noted.

## 2021-02-28 NOTE — Telephone Encounter (Signed)
Pt requested to have labs completed in 1 month to check B12 levels.  Given 577mcg B12 injection per request on 02/28/21.

## 2021-03-14 ENCOUNTER — Encounter: Payer: Self-pay | Admitting: Physician Assistant

## 2021-03-23 ENCOUNTER — Encounter: Payer: Self-pay | Admitting: Physician Assistant

## 2021-04-10 ENCOUNTER — Other Ambulatory Visit: Payer: Self-pay | Admitting: *Deleted

## 2021-04-10 MED ORDER — FAMOTIDINE 20 MG PO TABS: ORAL_TABLET | ORAL | 0 refills | Status: DC

## 2021-04-13 ENCOUNTER — Telehealth: Payer: Self-pay | Admitting: Physician Assistant

## 2021-04-13 NOTE — Telephone Encounter (Signed)
Copied from [place] [id number]. Topic: Medicare AWV >> Apr 13, 2021  5:03 PM Cher Nakai R wrote: Reason for CRM:  Left message for patient to call back and schedule their Medicare Annual Wellness Visit (AWV) either virtually or by telephone.  Please schedule at any time with [facility]. Saturdays are available to schedule in the month of September.   No history of AWV: eligible as of 12/12/2018 awvi per [facility]   40-minute appointment   Any questions, please contact me at [phone]

## 2021-05-01 ENCOUNTER — Telehealth: Payer: Self-pay

## 2021-05-01 NOTE — Telephone Encounter (Signed)
Pt called stating that she was cleaning up around some rental property last week was pulling up some vines.  She developed an extremely itchy rash 3-4 days ago that OTC treatments are not helping.  Pt states that she showed it to the pharmacist and he stated that it was definitely poison.  She states that rash is between her legs, on her back and abdomen.  Pt is requesting an RX for oral prednisone.  Please review request.  Charyl Bigger, CMA

## 2021-05-02 ENCOUNTER — Other Ambulatory Visit: Payer: Self-pay | Admitting: Family Medicine

## 2021-05-02 NOTE — Telephone Encounter (Addendum)
Patient is scheduled 05/03/2021 in the AM - CF

## 2021-05-03 ENCOUNTER — Ambulatory Visit (INDEPENDENT_AMBULATORY_CARE_PROVIDER_SITE_OTHER): Payer: Self-pay | Admitting: Physician Assistant

## 2021-05-03 DIAGNOSIS — Z5329 Procedure and treatment not carried out because of patient's decision for other reasons: Secondary | ICD-10-CM

## 2021-05-03 NOTE — Progress Notes (Signed)
No show

## 2021-05-22 ENCOUNTER — Other Ambulatory Visit: Payer: Self-pay | Admitting: Allergy & Immunology

## 2021-05-22 MED ORDER — FAMOTIDINE 20 MG PO TABS: ORAL_TABLET | ORAL | 0 refills | Status: DC

## 2021-05-26 ENCOUNTER — Emergency Department (HOSPITAL_BASED_OUTPATIENT_CLINIC_OR_DEPARTMENT_OTHER): Payer: Medicare Other

## 2021-05-26 ENCOUNTER — Other Ambulatory Visit: Payer: Self-pay

## 2021-05-26 ENCOUNTER — Encounter (HOSPITAL_BASED_OUTPATIENT_CLINIC_OR_DEPARTMENT_OTHER): Payer: Self-pay | Admitting: Emergency Medicine

## 2021-05-26 ENCOUNTER — Emergency Department (HOSPITAL_BASED_OUTPATIENT_CLINIC_OR_DEPARTMENT_OTHER)
Admission: EM | Admit: 2021-05-26 | Discharge: 2021-05-26 | Disposition: A | Discharge status: Home / Self Care | Payer: Medicare Other | Attending: Emergency Medicine | Admitting: Emergency Medicine

## 2021-05-26 DIAGNOSIS — Z91010 Allergy to peanuts: Secondary | ICD-10-CM | POA: Insufficient documentation

## 2021-05-26 DIAGNOSIS — Z7951 Long term (current) use of inhaled steroids: Secondary | ICD-10-CM | POA: Insufficient documentation

## 2021-05-26 DIAGNOSIS — E039 Hypothyroidism, unspecified: Secondary | ICD-10-CM | POA: Diagnosis not present

## 2021-05-26 DIAGNOSIS — R208 Other disturbances of skin sensation: Secondary | ICD-10-CM | POA: Diagnosis not present

## 2021-05-26 DIAGNOSIS — J45909 Unspecified asthma, uncomplicated: Secondary | ICD-10-CM | POA: Insufficient documentation

## 2021-05-26 DIAGNOSIS — T300 Burn of unspecified body region, unspecified degree: Secondary | ICD-10-CM

## 2021-05-26 DIAGNOSIS — Z87891 Personal history of nicotine dependence: Secondary | ICD-10-CM | POA: Diagnosis not present

## 2021-05-26 DIAGNOSIS — R0789 Other chest pain: Secondary | ICD-10-CM | POA: Insufficient documentation

## 2021-05-26 DIAGNOSIS — M545 Low back pain, unspecified: Secondary | ICD-10-CM | POA: Insufficient documentation

## 2021-05-26 DIAGNOSIS — R079 Chest pain, unspecified: Secondary | ICD-10-CM

## 2021-05-26 DIAGNOSIS — M79602 Pain in left arm: Secondary | ICD-10-CM | POA: Diagnosis not present

## 2021-05-26 LAB — CBC
HCT: 37.9 % (ref 36.0–46.0)
Hemoglobin: 13.4 g/dL (ref 12.0–15.0)
MCH: 32 pg (ref 26.0–34.0)
MCHC: 35.4 g/dL (ref 30.0–36.0)
MCV: 90.5 fL (ref 80.0–100.0)
Platelets: 285 10*3/uL (ref 150–400)
RBC: 4.19 MIL/uL (ref 3.87–5.11)
RDW: 11.7 % (ref 11.5–15.5)
WBC: 5 10*3/uL (ref 4.0–10.5)
nRBC: 0 % (ref 0.0–0.2)

## 2021-05-26 LAB — BASIC METABOLIC PANEL
Anion gap: 8 (ref 5–15)
BUN: 14 mg/dL (ref 8–23)
CO2: 26 mmol/L (ref 22–32)
Calcium: 9.5 mg/dL (ref 8.9–10.3)
Chloride: 102 mmol/L (ref 98–111)
Creatinine, Ser: 0.84 mg/dL (ref 0.44–1.00)
GFR, Estimated: >60 mL/min (ref >60)
Glucose, Bld: 158 mg/dL — ABNORMAL HIGH (ref 70–99)
Potassium: 4.5 mmol/L (ref 3.5–5.1)
Sodium: 136 mmol/L (ref 135–145)

## 2021-05-26 LAB — D-DIMER, QUANTITATIVE: D-Dimer, Quant: <0.27 ug/mL-FEU (ref 0.00–0.50)

## 2021-05-26 LAB — TROPONIN I (HIGH SENSITIVITY)
Troponin I (High Sensitivity): <2 ng/L (ref <18)
Troponin I (High Sensitivity): 2 ng/L (ref <18)

## 2021-05-26 MED ORDER — CYCLOBENZAPRINE HCL 10 MG PO TABS: 10.0000 mg | ORAL_TABLET | Freq: Two times a day (BID) | ORAL | 0 refills | Status: DC | PRN

## 2021-05-26 MED ORDER — KETOROLAC TROMETHAMINE 30 MG/ML IJ SOLN
30.0000 mg | Freq: Once | INTRAMUSCULAR | Status: AC
  Administered 2021-05-26: 30 mg via INTRAVENOUS
  Filled 2021-05-26: qty 1

## 2021-05-26 NOTE — ED Notes (Signed)
Assumed care from [place]. Patient laying quietly on gurney. No acute distress noted. Patient is requesting motrin" MD made aware. Patient updated on plan of care. Will continue to monitor.

## 2021-05-26 NOTE — ED Triage Notes (Signed)
Pt presents to ED POV. Pt c/o mid CP that radiates towards L arm. Pt reports that it began 3-4d ago and worsened today. Pt denies any cardiac hx.

## 2021-05-26 NOTE — ED Notes (Signed)
Pt on monitor 

## 2021-05-26 NOTE — ED Provider Notes (Signed)
[facility] Provider Note   CSN: [id number] Arrival date & time: 05/26/21  0957     History Chief Complaint  Patient presents with   Chest Pain    Jamie [name] is a 70 y.o. female.  HPI      3-4 days of left sided chest pain with radiaiton to the left arm, and left side of back, stinging, ripping pain Has had pain in left side of back for months, thought it was nerve pain  Was hurting and went to see chiropractor, after that the pain worsened and never let up, using metal thing under armpit, front, back and cracked neck When resting it is still there, feels inflammation Had 2 weeks of poison oak, was severe, had not had it in 30 years, just got over it one week ago Mild dyspnea No fever, some cough at night, not a lot but some, for a while No pain with eating Not worse with walking It is Worse with smaller movements No htn, hlpd, dm, cad Dad, brother with heart disease in 27s No smoking, alcohol occ, no other drugs  Past Medical History:  Diagnosis Date   Allergy    Anemia    Anxiety    Asthma    Cataract    bilateral   Depression    Diverticulitis    Diverticulosis    GERD (gastroesophageal reflux disease)    Headache    Hives of unknown origin 10/12/2019   Hyperlipidemia    IBS (irritable bowel syndrome)    Lung mass    Syncope 03/08/2015   Thyroid disease     Patient Active Problem List   Diagnosis Date Noted   Bilateral tinnitus 12/23/2020   Leg cramping 12/23/2020   Left ear impacted cerumen 12/23/2020   Acute non-recurrent maxillary sinusitis 12/23/2020   Osteoporosis 12/14/2020   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   Unintentional weight loss 07/22/2020   Impacted cerumen, bilateral 02/09/2020   Neuropathy 02/02/2020   Viral respiratory infection 12/04/2019   GAD (generalized anxiety disorder) 10/12/2019   Hives of unknown origin 10/12/2019   Anaphylactic syndrome 10/12/2019   Moderate protein-calorie malnutrition  ([place]) 10/07/2019   Allergic reaction 09/25/2019   Recurrent infections 09/25/2019   Seasonal allergic rhinitis due to pollen 09/25/2019   OAB (overactive bladder) 09/21/2019   Stress due to family tension 07/15/2019   Epigastric pain 07/06/2019   Tachycardia 01/02/2019   Numbness and tingling of right side of face 12/31/2018   Right-sided headache 12/31/2018   Cervical radiculopathy 10/01/2018   Vaginal irritation 09/15/2018   Anal itching 09/15/2018   Frequent urination 06/13/2018   ETD (Eustachian tube dysfunction), right 06/11/2018   Fatigue 01/15/2018   Heel pain, bilateral 01/15/2018   Muscle strain of upper back 01/15/2018   Acute bilateral low back pain with bilateral sciatica 10/07/2017   Pelvic floor weakness 09/16/2017   Bilateral headaches 09/16/2017   Adverse food reaction 09/02/2017   Vitamin D deficiency 08/23/2017   Iron deficiency anemia secondary to inadequate dietary iron intake 08/23/2017   Abnormal urine odor 08/23/2017   Atypical chest pain 08/23/2017   Abdominal bloating 08/23/2017   Anxiety about health 08/23/2017   Carotid stenosis, asymptomatic, bilateral 05/08/2017   History of excessive cerumen 05/08/2017   Cardiovascular risk factor 05/08/2017   Gastritis and duodenitis 04/15/2017   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   Vision changes 01/25/2017   Hematuria 01/25/2017   Hx of diverticulitis of colon  01/25/2017   Acute left lower quadrant pain 01/25/2017   Exposure to chemical inhalation 11/06/2016   Right knee pain 11/05/2016   Former smoker 05/14/2016   Hypopigmentation 01/11/2016   Anaphylactic reaction due to food 01/11/2016   Vaginal dryness 01/11/2016   Multiple food allergies 01/11/2016   Adhesive capsulitis of shoulder 10/17/2015   Carpal tunnel syndrome 10/17/2015   Left arm numbness 10/13/2015   Neck pain 10/13/2015   Pernicious anemia 10/13/2015   Chronically dry eyes 10/13/2015   DDD  (degenerative disc disease), cervical 08/31/2015   Mid back pain on left side 07/20/2015   Hair loss 07/20/2015   Family history of renal cancer 07/20/2015   History of shingles 07/19/2015   Low iron stores 07/19/2015   Subclinical hypothyroidism 07/19/2015   Constipation 07/19/2015   Hyperpigmentation of skin 07/19/2015   Depression with anxiety 06/18/2015   Normocytic anemia 06/18/2015   Diverticulitis of large intestine with abscess without bleeding    B12 deficiency 06/13/2015   Migraine with aura and with status migrainosus, not intractable 04/20/2015   Lung mass 04/20/2015   Generalized abdominal pain 01/24/2014   Osteopenia 01/20/2014   Nausea 01/29/2012   Seasonal and perennial allergic rhinitis 11/06/2011   Diverticulosis of large intestine 07/03/2011   Hyperlipidemia 12/13/2010   ONYCHOMYCOSIS, TOENAILS 06/09/2010   ANXIETY DEPRESSION 06/09/2010    Past Surgical History:  Procedure Laterality Date   ABDOMINAL HYSTERECTOMY     COLONOSCOPY     HEMORRHOID SURGERY     TUBAL LIGATION     TUMOR REMOVAL     Left thigh      OB History   No obstetric history on file.     Family History  Problem Relation Age of Onset   Diabetes Mother    Liver cancer Mother        renal cell    Heart disease Father        first MI in 63s   Cervical cancer Sister    Allergic rhinitis Sister    Asthma Sister    Heart disease Brother        first MI in 74s   Alcohol abuse Sister    Fibromyalgia Sister    Pancreatic cancer Sister 46   Arthritis Sister        osteoarthritis   Emphysema Sister    Irritable bowel syndrome Sister    Emphysema Sister    Leukemia Other        grandson   Urticaria Daughter    Allergic rhinitis Daughter    Stomach cancer Neg Hx    Colon cancer Neg Hx    Angioedema Neg Hx    Eczema Neg Hx    Immunodeficiency Neg Hx    Colon polyps Neg Hx    Esophageal cancer Neg Hx    Rectal cancer Neg Hx     Social History   Tobacco Use   Smoking  status: Former    Packs/day: 0.80    Years: 20.00    Pack years: 16.00    Types: Cigarettes    Quit date: 08/13/1985    Years since quitting: 35.8   Smokeless tobacco: Never  Vaping Use   Vaping Use: Never used  Substance Use Topics   Alcohol use: Yes    Comment: rare   Drug use: No    Home Medications Prior to Admission medications   Medication Sig Start Date End Date Taking? Authorizing Provider  cyclobenzaprine (FLEXERIL) 10 MG tablet Take 1  tablet (10 mg total) by mouth 2 (two) times daily as needed for muscle spasms. 05/26/21  Yes Gareth Morgan, MD  albuterol (PROVENTIL HFA) 108 (90 Base) MCG/ACT inhaler Inhale 2 puffs into the lungs every 6 (six) hours as needed for wheezing or shortness of breath. 02/03/21   Breeback, [name] L, PA-C  ALPRAZolam (XANAX) 0.5 MG tablet TAKE 1 TABLET BY MOUTH UP TO TWICE DAILY AS NEEDED 02/17/21   Breeback, [name] L, PA-C  blood glucose meter kit and supplies Dispense based on patient and insurance preference. Use up to four times daily as directed. DX: E16.2 08/25/20   Breeback, Royetta Car, PA-C  Blood Glucose Monitoring Suppl (ACCU-CHEK GUIDE ME) w/Device KIT Dx R73.09 Elevated glucose - Check blood sugar 4 times daily. 08/31/20   Breeback, [name] L, PA-C  conjugated estrogens (PREMARIN) vaginal cream APPLY VAGINALLY 3 TIMES A WEEK AT NIGHT 02/16/20   Breeback, [name] L, PA-C  Continuous Blood Gluc Receiver (DEXCOM G6 RECEIVER) DEVI 1 Device by Does not apply route 4 (four) times daily as needed. 08/29/20   Breeback, [name] L, PA-C  Continuous Blood Gluc Sensor (DEXCOM G6 SENSOR) MISC 1 Device by Does not apply route 4 (four) times daily as needed. 08/29/20   Breeback, [name] L, PA-C  Continuous Blood Gluc Transmit (DEXCOM G6 TRANSMITTER) MISC 1 Device by Does not apply route 4 (four) times daily as needed. 08/29/20   Breeback, [name] L, PA-C  EPINEPHRINE 0.3 mg/0.3 mL IJ SOAJ injection INJECT 0.3 ML IN THE MUSCLE AS NEEDED FOR ANAPHYLAXIS 01/10/21   Ambs, Kathrine Cords, FNP   famotidine (PEPCID) 20 MG tablet TAKE 1 TABLET(20 MG) BY MOUTH TWICE DAILY 05/22/21   Valentina Shaggy, MD  gabapentin (NEURONTIN) 300 MG capsule TAKE 1 CAPSULE(300 MG) BY MOUTH TWICE DAILY 01/10/21   Breeback, [name] L, PA-C  glucose blood (ACCU-CHEK GUIDE) test strip Dx R73.09 Elevated glucose - Check blood sugar 4 times daily. 08/31/20   Breeback, [name] L, PA-C  L-Methylfolate-B6-B12 (FOLTANX) 3-35-2 MG TABS Take 1 tablet by mouth in the morning and at bedtime. 02/02/20   Breeback, [name] L, PA-C  Lancets 30G MISC Dx R73.09 Elevated glucose - Check blood sugar 4 times daily. 08/31/20   [name], PA-C  levothyroxine (SYNTHROID) 50 MCG tablet TAKE 1 TABLET(50 MCG) BY MOUTH DAILY 04/27/20   Breeback, [name] L, PA-C  lubiprostone (AMITIZA) 8 MCG capsule Take 1 capsule (8 mcg total) by mouth 2 (two) times daily with a meal. 09/21/19   Breeback, [name] L, PA-C  meclizine (ANTIVERT) 25 MG tablet Take 1 tablet (25 mg total) by mouth 3 (three) times daily as needed for dizziness. 08/17/20   Breeback, [name] L, PA-C  NON FORMULARY Take 1 capsule by mouth 3 (three) times daily. Herbal supplements for gastritis    [provider]  omeprazole (PRILOSEC) 40 MG capsule TAKE 1 CAPSULE(40 MG) BY MOUTH DAILY 04/27/20   Breeback, [name] L, PA-C    Allergies    Iodine, Ciprofloxacin, Iodinated diagnostic agents, Peanut butter flavor, and Shellfish allergy  Review of Systems   Review of Systems  Constitutional:  Negative for fever.  Respiratory:  Positive for shortness of breath. Negative for cough (at night chronic mild).   Cardiovascular:  Positive for chest pain.  Gastrointestinal:  Negative for abdominal pain, nausea and vomiting.  Musculoskeletal:  Positive for back pain.  Skin:  Positive for wound.  Neurological:  Negative for headaches.   Physical Exam Updated Vital Signs BP 129/80  Pulse 77   Temp 98.2 F (36.8 C) (Oral)   Resp 16   Ht '5\' 4"'  (1.626 m)   Wt 63.5 kg   SpO2 99%   BMI 24.03  kg/m   Physical Exam Vitals and nursing note reviewed.  Constitutional:      General: She is not in acute distress.    Appearance: She is well-developed. She is not diaphoretic.  HENT:     Head: Normocephalic and atraumatic.  Eyes:     Conjunctiva/sclera: Conjunctivae normal.  Cardiovascular:     Rate and Rhythm: Normal rate and regular rhythm.     Heart sounds: Normal heart sounds. No murmur heard.   No friction rub. No gallop.     Comments: Tenderness lateral left chest wall under axilla, left side of back Pulmonary:     Effort: Pulmonary effort is normal. No respiratory distress.     Breath sounds: Normal breath sounds. No wheezing or rales.  Abdominal:     General: There is no distension.     Palpations: Abdomen is soft.     Tenderness: There is no abdominal tenderness. There is no guarding.  Musculoskeletal:        General: No tenderness.     Cervical back: Normal range of motion.  Skin:    General: Skin is warm and dry.     Findings: No erythema or rash.     Comments: Vesicles left side of chest wall (reports burns from heating pad)  Neurological:     Mental Status: She is alert and oriented to person, place, and time.    ED Results / Procedures / Treatments   Labs (all labs ordered are listed, but only abnormal results are displayed) Labs Reviewed  BASIC METABOLIC PANEL - Abnormal; Notable for the following components:      Result Value   Glucose, Bld 158 (*)    All other components within normal limits  CBC  D-DIMER, QUANTITATIVE  TROPONIN I (HIGH SENSITIVITY)  TROPONIN I (HIGH SENSITIVITY)    EKG EKG Interpretation  Date/Time:  Friday May 26 2021 10:06:21 EDT Ventricular Rate:  95 PR Interval:  118 QRS Duration: 78 QT Interval:  350 QTC Calculation: 439 R Axis:   71 Text Interpretation: Normal sinus rhythm Normal ECG No significant change since last tracing Confirmed by Gareth Morgan [phone]) on 05/26/2021 11:40:49 AM  Radiology DG Chest 2  View  Result Date: 05/26/2021 CLINICAL DATA:  Chest pain EXAM: CHEST - 2 VIEW COMPARISON:  Chest x-ray dated September 30, 2018 FINDINGS: The heart size and mediastinal contours are within normal limits. Both lungs are clear. The visualized skeletal structures are unremarkable. IMPRESSION: No active cardiopulmonary disease. Electronically Signed   By: Yetta Glassman M.D.   On: 05/26/2021 10:54    Procedures Procedures   Medications Ordered in ED Medications  ketorolac (TORADOL) 30 MG/ML injection 30 mg (30 mg Intravenous Given 05/26/21 1355)    ED Course  I have reviewed the triage vital signs and the nursing notes.  Pertinent labs & imaging results that were available during my care of the patient were reviewed by me and considered in my medical decision making (see chart for details).    MDM Rules/Calculators/A&P                           70yo female with history above including asthma, anxiety, family hx of CAD, presents with concern for chest pain.  Differential diagnosis  for chest pain includes pulmonary embolus, dissection, pneumothorax, pneumonia, ACS, myocarditis, pericarditis.  EKG was done and evaluate by me and showed no acute ST changes and no signs of pericarditis. Chest x-ray was done and evaluated by me and radiology and showed no sign of pneumonia or pneumothorax. She is low risk [place] with a negative ddimer and have low suspicion for PE.  Discussed that aortic dissection on differential and her allergy to IV contrast--and while she describes pain goint to back, have low suspicion  for this in setting of 4 days of symptoms, normal pulses bilaterally, normal CXR, no abdominal pain, and a normal ddimer and do not feel transfer for other types of imaging is indicated.  She describes having some back pain and that symptoms worsened after chiropractor performed procedure, and given pain with small movements and palpation suspect symptoms more likely secondary to muscular  inflammation/contusion than cardiac or dissection.  She had delta troponins of 2 and less than 2, has nonexertional chest pain worse with palpation and do not feel cardiac admission indicated.  Given toradol with some improvement. Recommend close outpatient follow up, ibuprofen, tylenol and given rx for flexeril.  She also has area of skin burn from using heating pads---discussed continued monitoring for infection, bacitracin. Offered tdap booster but she declines.  Patient discharged in stable condition with understanding of reasons to return.      Final Clinical Impression(s) / ED Diagnoses Final diagnoses:  Chest pain, unspecified type  Musculoskeletal chest pain  Burn    Rx / DC Orders ED Discharge Orders          Ordered    cyclobenzaprine (FLEXERIL) 10 MG tablet  2 times daily PRN        05/26/21 1433             Gareth Morgan, MD 05/27/21 [phone]

## 2021-05-26 NOTE — ED Notes (Signed)
Patient reports left sided chest pain x 4 days, radiating to her back and left arm, pain has been increasing in intensity.

## 2021-05-29 ENCOUNTER — Other Ambulatory Visit: Payer: Self-pay

## 2021-05-29 ENCOUNTER — Other Ambulatory Visit (HOSPITAL_BASED_OUTPATIENT_CLINIC_OR_DEPARTMENT_OTHER): Payer: Self-pay

## 2021-05-29 ENCOUNTER — Emergency Department (HOSPITAL_BASED_OUTPATIENT_CLINIC_OR_DEPARTMENT_OTHER)
Admission: EM | Admit: 2021-05-29 | Discharge: 2021-05-29 | Disposition: A | Discharge status: Home / Self Care | Payer: Medicare Other | Attending: Emergency Medicine | Admitting: Emergency Medicine

## 2021-05-29 ENCOUNTER — Encounter (HOSPITAL_BASED_OUTPATIENT_CLINIC_OR_DEPARTMENT_OTHER): Payer: Self-pay

## 2021-05-29 ENCOUNTER — Emergency Department (HOSPITAL_BASED_OUTPATIENT_CLINIC_OR_DEPARTMENT_OTHER): Payer: Medicare Other

## 2021-05-29 DIAGNOSIS — Z79899 Other long term (current) drug therapy: Secondary | ICD-10-CM | POA: Diagnosis not present

## 2021-05-29 DIAGNOSIS — B029 Zoster without complications: Secondary | ICD-10-CM | POA: Diagnosis not present

## 2021-05-29 DIAGNOSIS — R079 Chest pain, unspecified: Secondary | ICD-10-CM | POA: Diagnosis not present

## 2021-05-29 DIAGNOSIS — J45909 Unspecified asthma, uncomplicated: Secondary | ICD-10-CM | POA: Diagnosis not present

## 2021-05-29 DIAGNOSIS — R21 Rash and other nonspecific skin eruption: Secondary | ICD-10-CM | POA: Diagnosis present

## 2021-05-29 DIAGNOSIS — Z87891 Personal history of nicotine dependence: Secondary | ICD-10-CM | POA: Diagnosis not present

## 2021-05-29 MED ORDER — PREDNISONE 20 MG PO TABS
20.0000 mg | ORAL_TABLET | Freq: Two times a day (BID) | ORAL | 0 refills | Status: DC
  Filled 2021-05-29: qty 10, 5d supply, fill #0

## 2021-05-29 MED ORDER — ONDANSETRON 4 MG PO TBDP
4.0000 mg | ORAL_TABLET | Freq: Once | ORAL | Status: AC
  Administered 2021-05-29: 4 mg via ORAL
  Filled 2021-05-29: qty 1

## 2021-05-29 MED ORDER — OXYCODONE-ACETAMINOPHEN 5-325 MG PO TABS
1.0000 | ORAL_TABLET | Freq: Once | ORAL | Status: AC
  Administered 2021-05-29: 1 via ORAL
  Filled 2021-05-29: qty 1

## 2021-05-29 MED ORDER — OXYCODONE-ACETAMINOPHEN 5-325 MG PO TABS
1.0000 | ORAL_TABLET | Freq: Four times a day (QID) | ORAL | 0 refills | Status: DC | PRN
  Filled 2021-05-29: qty 8, 2d supply, fill #0

## 2021-05-29 NOTE — Discharge Instructions (Signed)
Call your primary care doctor or specialist as discussed in the next 2-3 days.   Return immediately back to the ER if:  Your symptoms worsen within the next 12-24 hours. You develop new symptoms such as new fevers, persistent vomiting, new pain, shortness of breath, or new weakness or numbness, or if you have any other concerns.  

## 2021-05-29 NOTE — ED Notes (Signed)
Patient with blistering rash from left breast to left back, blistering, painful.  Patient has a distant history of shingles but she said this is much worse.

## 2021-05-29 NOTE — ED Provider Notes (Addendum)
[facility] EMERGENCY DEPARTMENT Provider Note   CSN: [id number] Arrival date & time: 05/29/21  0855     History Chief Complaint  Patient presents with   Rash    Jamie [name] is a 71 y.o. female.  Patient presents with diffuse vesicular rash on the left chest with persistent chest pain and back pain.  Symptoms been ongoing for the past 5 days.  No reports of fevers or cough no reports of vomiting or diarrhea.  She was seen in the ER approximately 3 days ago with a negative work-up and discharged home.  Since then she states that the rash is gotten worse.      Past Medical History:  Diagnosis Date   Allergy    Anemia    Anxiety    Asthma    Cataract    bilateral   Depression    Diverticulitis    Diverticulosis    GERD (gastroesophageal reflux disease)    Headache    Hives of unknown origin 10/12/2019   Hyperlipidemia    IBS (irritable bowel syndrome)    Lung mass    Syncope 03/08/2015   Thyroid disease     Patient Active Problem List   Diagnosis Date Noted   Bilateral tinnitus 12/23/2020   Leg cramping 12/23/2020   Left ear impacted cerumen 12/23/2020   Acute non-recurrent maxillary sinusitis 12/23/2020   Osteoporosis 12/14/2020   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   Unintentional weight loss 07/22/2020   Impacted cerumen, bilateral 02/09/2020   Neuropathy 02/02/2020   Viral respiratory infection 12/04/2019   GAD (generalized anxiety disorder) 10/12/2019   Hives of unknown origin 10/12/2019   Anaphylactic syndrome 10/12/2019   Moderate protein-calorie malnutrition ([place]) 10/07/2019   Allergic reaction 09/25/2019   Recurrent infections 09/25/2019   Seasonal allergic rhinitis due to pollen 09/25/2019   OAB (overactive bladder) 09/21/2019   Stress due to family tension 07/15/2019   Epigastric pain 07/06/2019   Tachycardia 01/02/2019   Numbness and tingling of right side of face 12/31/2018   Right-sided headache 12/31/2018   Cervical radiculopathy  10/01/2018   Vaginal irritation 09/15/2018   Anal itching 09/15/2018   Frequent urination 06/13/2018   ETD (Eustachian tube dysfunction), right 06/11/2018   Fatigue 01/15/2018   Heel pain, bilateral 01/15/2018   Muscle strain of upper back 01/15/2018   Acute bilateral low back pain with bilateral sciatica 10/07/2017   Pelvic floor weakness 09/16/2017   Bilateral headaches 09/16/2017   Adverse food reaction 09/02/2017   Vitamin D deficiency 08/23/2017   Iron deficiency anemia secondary to inadequate dietary iron intake 08/23/2017   Abnormal urine odor 08/23/2017   Atypical chest pain 08/23/2017   Abdominal bloating 08/23/2017   Anxiety about health 08/23/2017   Carotid stenosis, asymptomatic, bilateral 05/08/2017   History of excessive cerumen 05/08/2017   Cardiovascular risk factor 05/08/2017   Gastritis and duodenitis 04/15/2017   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   Vision changes 01/25/2017   Hematuria 01/25/2017   Hx of diverticulitis of colon 01/25/2017   Acute left lower quadrant pain 01/25/2017   Exposure to chemical inhalation 11/06/2016   Right knee pain 11/05/2016   Former smoker 05/14/2016   Hypopigmentation 01/11/2016   Anaphylactic reaction due to food 01/11/2016   Vaginal dryness 01/11/2016   Multiple food allergies 01/11/2016   Adhesive capsulitis of shoulder 10/17/2015   Carpal tunnel syndrome 10/17/2015   Left arm numbness 10/13/2015   Neck pain 10/13/2015   Pernicious  anemia 10/13/2015   Chronically dry eyes 10/13/2015   DDD (degenerative disc disease), cervical 08/31/2015   Mid back pain on left side 07/20/2015   Hair loss 07/20/2015   Family history of renal cancer 07/20/2015   History of shingles 07/19/2015   Low iron stores 07/19/2015   Subclinical hypothyroidism 07/19/2015   Constipation 07/19/2015   Hyperpigmentation of skin 07/19/2015   Depression with anxiety 06/18/2015   Normocytic anemia 06/18/2015    Diverticulitis of large intestine with abscess without bleeding    B12 deficiency 06/13/2015   Migraine with aura and with status migrainosus, not intractable 04/20/2015   Lung mass 04/20/2015   Generalized abdominal pain 01/24/2014   Osteopenia 01/20/2014   Nausea 01/29/2012   Seasonal and perennial allergic rhinitis 11/06/2011   Diverticulosis of large intestine 07/03/2011   Hyperlipidemia 12/13/2010   ONYCHOMYCOSIS, TOENAILS 06/09/2010   ANXIETY DEPRESSION 06/09/2010    Past Surgical History:  Procedure Laterality Date   ABDOMINAL HYSTERECTOMY     COLONOSCOPY     HEMORRHOID SURGERY     TUBAL LIGATION     TUMOR REMOVAL     Left thigh      OB History   No obstetric history on file.     Family History  Problem Relation Age of Onset   Diabetes Mother    Liver cancer Mother        renal cell    Heart disease Father        first MI in 45s   Cervical cancer Sister    Allergic rhinitis Sister    Asthma Sister    Heart disease Brother        first MI in 26s   Alcohol abuse Sister    Fibromyalgia Sister    Pancreatic cancer Sister 64   Arthritis Sister        osteoarthritis   Emphysema Sister    Irritable bowel syndrome Sister    Emphysema Sister    Leukemia Other        grandson   Urticaria Daughter    Allergic rhinitis Daughter    Stomach cancer Neg Hx    Colon cancer Neg Hx    Angioedema Neg Hx    Eczema Neg Hx    Immunodeficiency Neg Hx    Colon polyps Neg Hx    Esophageal cancer Neg Hx    Rectal cancer Neg Hx     Social History   Tobacco Use   Smoking status: Former    Packs/day: 0.80    Years: 20.00    Pack years: 16.00    Types: Cigarettes    Quit date: 08/13/1985    Years since quitting: 35.8   Smokeless tobacco: Never  Vaping Use   Vaping Use: Never used  Substance Use Topics   Alcohol use: Yes    Comment: rare   Drug use: No    Home Medications Prior to Admission medications   Medication Sig Start Date End Date Taking? Authorizing  Provider  oxyCODONE-acetaminophen (PERCOCET/ROXICET) 5-325 MG tablet Take 1 tablet by mouth every 6 (six) hours as needed for up to 8 doses for severe pain. 05/29/21  Yes Amyah Clawson, Greggory Brandy, MD  predniSONE (DELTASONE) 20 MG tablet Take 1 tablet (20 mg total) by mouth 2 (two) times daily with a meal for 5 days. 05/29/21 06/03/21 Yes Luna Fuse, MD  albuterol (PROVENTIL HFA) 108 (90 Base) MCG/ACT inhaler Inhale 2 puffs into the lungs every 6 (six) hours as needed for  wheezing or shortness of breath. 02/03/21   Breeback, [name] L, PA-C  ALPRAZolam (XANAX) 0.5 MG tablet TAKE 1 TABLET BY MOUTH UP TO TWICE DAILY AS NEEDED 02/17/21   Breeback, [name] L, PA-C  blood glucose meter kit and supplies Dispense based on patient and insurance preference. Use up to four times daily as directed. DX: E16.2 08/25/20   Breeback, Royetta Car, PA-C  Blood Glucose Monitoring Suppl (ACCU-CHEK GUIDE ME) w/Device KIT Dx R73.09 Elevated glucose - Check blood sugar 4 times daily. 08/31/20   Breeback, [name] L, PA-C  conjugated estrogens (PREMARIN) vaginal cream APPLY VAGINALLY 3 TIMES A WEEK AT NIGHT 02/16/20   Breeback, [name] L, PA-C  Continuous Blood Gluc Receiver (DEXCOM G6 RECEIVER) DEVI 1 Device by Does not apply route 4 (four) times daily as needed. 08/29/20   Breeback, [name] L, PA-C  Continuous Blood Gluc Sensor (DEXCOM G6 SENSOR) MISC 1 Device by Does not apply route 4 (four) times daily as needed. 08/29/20   Breeback, [name] L, PA-C  Continuous Blood Gluc Transmit (DEXCOM G6 TRANSMITTER) MISC 1 Device by Does not apply route 4 (four) times daily as needed. 08/29/20   Breeback, Royetta Car, PA-C  cyclobenzaprine (FLEXERIL) 10 MG tablet Take 1 tablet (10 mg total) by mouth 2 (two) times daily as needed for muscle spasms. 05/26/21   Gareth Morgan, MD  EPINEPHRINE 0.3 mg/0.3 mL IJ SOAJ injection INJECT 0.3 ML IN THE MUSCLE AS NEEDED FOR ANAPHYLAXIS 01/10/21   Ambs, Kathrine Cords, FNP  famotidine (PEPCID) 20 MG tablet TAKE 1 TABLET(20 MG) BY MOUTH TWICE DAILY  05/22/21   Valentina Shaggy, MD  gabapentin (NEURONTIN) 300 MG capsule TAKE 1 CAPSULE(300 MG) BY MOUTH TWICE DAILY 01/10/21   Breeback, [name] L, PA-C  glucose blood (ACCU-CHEK GUIDE) test strip Dx R73.09 Elevated glucose - Check blood sugar 4 times daily. 08/31/20   Breeback, [name] L, PA-C  L-Methylfolate-B6-B12 (FOLTANX) 3-35-2 MG TABS Take 1 tablet by mouth in the morning and at bedtime. 02/02/20   Breeback, [name] L, PA-C  Lancets 30G MISC Dx R73.09 Elevated glucose - Check blood sugar 4 times daily. 08/31/20   [name], PA-C  levothyroxine (SYNTHROID) 50 MCG tablet TAKE 1 TABLET(50 MCG) BY MOUTH DAILY 04/27/20   Breeback, [name] L, PA-C  lubiprostone (AMITIZA) 8 MCG capsule Take 1 capsule (8 mcg total) by mouth 2 (two) times daily with a meal. 09/21/19   Breeback, [name] L, PA-C  meclizine (ANTIVERT) 25 MG tablet Take 1 tablet (25 mg total) by mouth 3 (three) times daily as needed for dizziness. 08/17/20   Breeback, [name] L, PA-C  NON FORMULARY Take 1 capsule by mouth 3 (three) times daily. Herbal supplements for gastritis    [provider]  omeprazole (PRILOSEC) 40 MG capsule TAKE 1 CAPSULE(40 MG) BY MOUTH DAILY 04/27/20   Breeback, [name] L, PA-C    Allergies    Iodine, Ciprofloxacin, Iodinated diagnostic agents, Peanut butter flavor, and Shellfish allergy  Review of Systems   Review of Systems  Constitutional:  Negative for fever.  HENT:  Negative for ear pain.   Eyes:  Negative for pain.  Respiratory:  Negative for cough.   Cardiovascular:  Positive for chest pain.  Gastrointestinal:  Negative for abdominal pain.  Genitourinary:  Negative for flank pain.  Musculoskeletal:  Positive for back pain.  Skin:  Negative for rash.  Neurological:  Negative for headaches.   Physical Exam Updated Vital Signs BP 127/68   Pulse 90   Temp  98.7 F (37.1 C) (Oral)   Resp 14   SpO2 100%   Physical Exam Constitutional:      General: She is not in acute distress.    Appearance: Normal  appearance.  HENT:     Head: Normocephalic.     Nose: Nose normal.  Eyes:     Extraocular Movements: Extraocular movements intact.  Cardiovascular:     Rate and Rhythm: Normal rate.  Pulmonary:     Effort: Pulmonary effort is normal.  Musculoskeletal:        General: Normal range of motion.     Cervical back: Normal range of motion.  Skin:    Findings: Lesion and rash present.     Comments: Vesicular rash extending from the left breast region across the flank and to the left upper back region.  No evidence of secondary cellulitis or infection.  This is tender to palpation and consistent with shingles rash.  Neurological:     General: No focal deficit present.     Mental Status: She is alert. Mental status is at baseline.    ED Results / Procedures / Treatments   Labs (all labs ordered are listed, but only abnormal results are displayed) Labs Reviewed - No data to display  EKG EKG Interpretation  Date/Time:  Monday May 29 2021 09:49:32 EDT Ventricular Rate:  96 PR Interval:  113 QRS Duration: 97 QT Interval:  353 QTC Calculation: 447 R Axis:   48 Text Interpretation: Sinus rhythm Borderline short PR interval Confirmed by Thamas Jaegers ([id number]) on 05/29/2021 11:26:46 AM  Radiology CT Chest Wo Contrast  Result Date: 05/29/2021 CLINICAL DATA:  Rash on left breast/chest with pain that radiates to back. EXAM: CT CHEST WITHOUT CONTRAST TECHNIQUE: Multidetector CT imaging of the chest was performed following the standard protocol without IV contrast. COMPARISON:  05/26/2021 chest radiograph.  11/13/2016 chest CT FINDINGS: Cardiovascular: Aortic atherosclerosis. Normal heart size, without pericardial effusion. Lad coronary artery calcification. Mediastinum/Nodes: No mediastinal or definite hilar adenopathy, given limitations of unenhanced CT. Lungs/Pleura: No pleural fluid. Left upper lobe linear scarring, less nodular than on 11/13/2016. Areas of fissure thickening and Peri  fissural nodularity are similar, likely due to subpleural lymph nodes. Mild centrilobular emphysema. Upper Abdomen: Normal imaged portions of the liver, spleen, stomach, pancreas, gallbladder, adrenal glands, kidneys. Musculoskeletal: No acute osseous abnormality. IMPRESSION: 1.  No acute process in the chest. 2. Aortic atherosclerosis (ICD10-I70.0), coronary artery atherosclerosis and emphysema (ICD10-J43.9). Electronically Signed   By: Abigail Miyamoto M.D.   On: 05/29/2021 11:21    Procedures Procedures   Medications Ordered in ED Medications  oxyCODONE-acetaminophen (PERCOCET/ROXICET) 5-325 MG per tablet 1 tablet (1 tablet Oral Given 05/29/21 0946)  ondansetron (ZOFRAN-ODT) disintegrating tablet 4 mg (4 mg Oral Given 05/29/21 0946)    ED Course  I have reviewed the triage vital signs and the nursing notes.  Pertinent labs & imaging results that were available during my care of the patient were reviewed by me and considered in my medical decision making (see chart for details).    MDM Rules/Calculators/A&P                           EKG shows sinus rhythm normal rate no ST elevation depressions no T wave inversions noted. Findings consistent with shingles.  Patient given Percocet here with improvement of symptoms.  Recommending outpatient follow-up with primary care doctor within the week.  Recommending immediate return for difficulty breathing worsening  symptoms or any additional concerns.  Addendum: Despite clinical presentation, patient appears reluctant to accept diagnosis of shingles related pain.  Requesting further imaging which was pursued with no additional acute findings noted.  Symptoms improved with Percocet was given here discharged home in stable condition advised outpatient follow-up with her doctor within the week.    Final Clinical Impression(s) / ED Diagnoses Final diagnoses:  Herpes zoster without complication    Rx / DC Orders ED Discharge Orders          Ordered     oxyCODONE-acetaminophen (PERCOCET/ROXICET) 5-325 MG tablet  Every 6 hours PRN        05/29/21 0955    predniSONE (DELTASONE) 20 MG tablet  2 times daily with meals        05/29/21 0955             Luna Fuse, MD 05/29/21 [id number]    Luna Fuse, MD 05/29/21 1204

## 2021-05-29 NOTE — ED Triage Notes (Addendum)
Painful blistering rash from left breast around to left back.  Patient seen here for chest pain on 10-14 but rash was only minor and beneath left breast then.

## 2021-05-30 ENCOUNTER — Telehealth: Payer: Self-pay | Admitting: Physician Assistant

## 2021-05-30 ENCOUNTER — Other Ambulatory Visit: Payer: Self-pay

## 2021-05-30 DIAGNOSIS — F419 Anxiety disorder, unspecified: Secondary | ICD-10-CM

## 2021-05-30 MED ORDER — ALPRAZOLAM 0.5 MG PO TABS: ORAL_TABLET | ORAL | 1 refills | Status: DC

## 2021-05-30 NOTE — Telephone Encounter (Signed)
Pt  called.  She was seen at the ER for Shingles.  She is running low on Prednisone and Oxycodone and you have no openings.  Thank you.

## 2021-05-30 NOTE — Telephone Encounter (Signed)
Patient left a vm msg stating she left several msgs for provider. Per patient, she has been to the ER on 05/26/21 for chest pain and on 05/29/21 for Shingles.   Patient is requesting a med refill for alprazolam. Rx pended.

## 2021-05-31 NOTE — Telephone Encounter (Signed)
I called pt.  She does not have enough of the Oxycodone to last until Friday. She has 3 left and she takes two a day. Thank you.

## 2021-05-31 NOTE — Telephone Encounter (Signed)
I misunderstood you. I can double book a virtual at end of the day for Jamie [name] if needed.

## 2021-06-01 ENCOUNTER — Telehealth: Payer: Self-pay | Admitting: Physician Assistant

## 2021-06-01 ENCOUNTER — Encounter: Payer: Self-pay | Admitting: Physician Assistant

## 2021-06-01 MED ORDER — OXYCODONE-ACETAMINOPHEN 5-325 MG PO TABS: 1.0000 | ORAL_TABLET | Freq: Four times a day (QID) | ORAL | 0 refills | Status: DC | PRN

## 2021-06-01 NOTE — Telephone Encounter (Signed)
I have sent in an additional 8 tablets until she is seen.    Thanks!  CM

## 2021-06-01 NOTE — Telephone Encounter (Addendum)
Jamie [name] called crying.  She's in a lot of pain and is out of her Oxycodone.  Please advise.

## 2021-06-01 NOTE — Telephone Encounter (Signed)
Looks like patient was seen in the ED for Shingles on 05/29/2021. She was given:  oxyCODONE-acetaminophen (PERCOCET/ROXICET) 5-325 MG tablet [[id number]]   Order Details Dose: 1 tablet Route: Oral Frequency: Every 6 hours PRN for severe pain  Dispense Quantity: 8 tablet Refills: 0 Fills remaining: 0       Sig: Take 1 tablet by mouth every 6 (six) hours as needed for up to 8 doses for severe pain.   At that time. She has an appointment with [name] tomorrow to discuss pain. Kartier Bennison not in the office today, please advise.

## 2021-06-01 NOTE — Telephone Encounter (Signed)
I used your Friday 11:10 Acute slot. I hope that's okay.

## 2021-06-02 ENCOUNTER — Telehealth (INDEPENDENT_AMBULATORY_CARE_PROVIDER_SITE_OTHER): Payer: Medicare Other | Admitting: Physician Assistant

## 2021-06-02 ENCOUNTER — Encounter: Payer: Self-pay | Admitting: Physician Assistant

## 2021-06-02 VITALS — Ht 64.0 in | Wt 140.0 lb

## 2021-06-02 DIAGNOSIS — B029 Zoster without complications: Secondary | ICD-10-CM

## 2021-06-02 HISTORY — DX: Zoster without complications: B02.9

## 2021-06-02 MED ORDER — LIDOCAINE 5 % EX PTCH: 1.0000 | MEDICATED_PATCH | CUTANEOUS | 0 refills | Status: DC

## 2021-06-02 MED ORDER — LIDOCAINE 5 % EX OINT: 1.0000 "application " | TOPICAL_OINTMENT | Freq: Every day | CUTANEOUS | 0 refills | Status: DC

## 2021-06-02 MED ORDER — OXYCODONE-ACETAMINOPHEN 5-325 MG PO TABS: 1.0000 | ORAL_TABLET | Freq: Four times a day (QID) | ORAL | 0 refills | Status: DC | PRN

## 2021-06-02 MED ORDER — IBUPROFEN 800 MG PO TABS
800.0000 mg | ORAL_TABLET | Freq: Three times a day (TID) | ORAL | 1 refills | Status: DC | PRN
Start: 2021-06-02 — End: 2022-10-24

## 2021-06-02 MED ORDER — GABAPENTIN 300 MG PO CAPS: ORAL_CAPSULE | ORAL | 0 refills | Status: DC

## 2021-06-02 NOTE — Patient Instructions (Signed)
Shingles ?Shingles, which is also known as herpes zoster, is an infection that causes a painful skin rash and fluid-filled blisters. It is caused by a virus. ?Shingles only develops in people who: ?Have had chickenpox. ?Have been vaccinated against chickenpox. Shingles is rare in this group. ?What are the causes? ?Shingles is caused by varicella-zoster virus. This is the same virus that causes chickenpox. After a person is exposed to the virus, it stays in the body in an inactive (dormant) state. Shingles develops if the virus is reactivated. This can happen many years after the first (initial) exposure to the virus. It is not known what causes this virus to be reactivated. ?What increases the risk? ?People who have had chickenpox or received the chickenpox vaccine are at risk for shingles. Shingles infection is more common in people who: ?Are older than 68 years of age. ?Have a weakened disease-fighting system (immune system), such as people with: ?HIV (human immunodeficiency virus). ?AIDS (acquired immunodeficiency syndrome). ?Cancer. ?Are taking medicines that weaken the immune system, such as organ transplant medicines. ?Are experiencing a lot of stress. ?What are the signs or symptoms? ?Early symptoms of this condition include itching, tingling, and pain in an area on your skin. Pain may be described as burning, stabbing, or throbbing. ?A few days or weeks after early symptoms start, a painful red rash appears. The rash is usually on one side of the body and has a band-like or belt-like pattern. The rash eventually turns into fluid-filled blisters that break open, change into scabs, and dry up in about 2-3 weeks. ?At any time during the infection, you may also develop: ?A fever. ?Chills. ?A headache. ?Nausea. ?How is this diagnosed? ?This condition is diagnosed with a skin exam. Skin or fluid samples (a culture) may be taken from the blisters before a diagnosis is made. ?How is this treated? ?The rash may last  for several weeks. There is not a specific cure for this condition. Your health care provider may prescribe medicines to help you manage pain, recover more quickly, and avoid long-term problems. Medicines may include: ?Antiviral medicines. ?Anti-inflammatory medicines. ?Pain medicines. ?Anti-itching medicines (antihistamines). ?If the area involved is on your face, you may be referred to a specialist, such as an eye doctor (ophthalmologist) or an ear, nose, and throat (ENT) doctor (otorhinolaryngologist) to help you avoid eye problems, chronic pain, or disability. ?Follow these instructions at home: ?Medicines ?Take over-the-counter and prescription medicines only as told by your health care provider. ?Apply an anti-itch cream or numbing cream to the affected area as told by your health care provider. ?Relieving itching and discomfort ? ?Apply cold, wet cloths (cold compresses) to the area of the rash or blisters as told by your health care provider. ?Cool baths can be soothing. Try adding baking soda or dry oatmeal to the water to reduce itching. Do not bathe in hot water. ?Use calamine lotion as recommended by your health care provider. This is an over-the-counter lotion that helps to relieve itchiness. ?Blister and rash care ?Keep your rash covered with a loose bandage (dressing). Wear loose-fitting clothing to help ease the pain of material rubbing against the rash. ?Wash your hands with soap and water for at least 20 seconds before and after you change your dressing. If soap and water are not available, use hand sanitizer. ?Change your dressing as told by your health care provider. ?Keep your rash and blisters clean by washing the area with mild soap and cool water as told by your health   care provider. ?Check your rash every day for signs of infection. Check for: ?More redness, swelling, or pain. ?Fluid or blood. ?Warmth. ?Pus or a bad smell. ?Do not scratch your rash or pick at your blisters. To help avoid  scratching: ?Keep your fingernails clean and cut short. ?Wear gloves or mittens while you sleep, if scratching is a problem. ?General instructions ?Rest as told by your health care provider. ?Wash your hands often with soap and water for at least 20 seconds. If soap and water are not available, use hand sanitizer. Doing this lowers your chance of getting a bacterial skin infection. ?Before your blisters change into scabs, your shingles infection can cause chickenpox in people who have never had it or have never been vaccinated against it. To prevent this from happening, avoid contact with other people, especially: ?Babies. ?Pregnant women. ?Children who have eczema. ?Older people who have transplants. ?People who have chronic illnesses, such as cancer or AIDS. ?Keep all follow-up visits. This is important. ?How is this prevented? ?Getting vaccinated is the best way to prevent shingles and protect against shingles complications. If you have not been vaccinated, talk with your health care provider about getting the vaccine. ?Where to find more information ?Centers for Disease Control and Prevention: www.cdc.gov ?Contact a health care provider if: ?Your pain is not relieved with prescribed medicines. ?Your pain does not get better after the rash heals. ?You have any of these signs of infection: ?More redness, swelling, or pain around the rash. ?Fluid or blood coming from the rash. ?Warmth coming from your rash. ?Pus or a bad smell coming from the rash. ?A fever. ?Get help right away if: ?The rash is on your face or nose. ?You have facial pain, pain around your eye area, or loss of feeling on one side of your face. ?You have difficulty seeing. ?You have ear pain or have ringing in your ear. ?You have a loss of taste. ?Your condition gets worse. ?Summary ?Shingles, also known as herpes zoster, is an infection that causes a painful skin rash and fluid-filled blisters. ?This condition is diagnosed with a skin exam. Skin or  fluid samples (a culture) may be taken from the blisters. ?Keep your rash covered with a loose bandage (dressing). Wear loose-fitting clothing to help ease the pain of material rubbing against the rash. ?Before your blisters change into scabs, your shingles infection can cause chickenpox in people who have never had it or have never been vaccinated against it. ?This information is not intended to replace advice given to you by your health care provider. Make sure you discuss any questions you have with your health care provider. ?Document Revised: 07/25/2020 Document Reviewed: 07/25/2020 ?Elsevier Patient Education ? 2022 [employer] ? ?

## 2021-06-02 NOTE — Progress Notes (Signed)
..  Virtual Visit via Telephone Note  I connected with Jamie [name] on 06/02/21 at 11:10 AM EDT by telephone and verified that I am speaking with the correct person using two identifiers.  Location: Patient: home Provider: clinic  .Marland KitchenParticipating in visit:  Patient: Jamie [name] Provider: [name] PA-C   I discussed the limitations, risks, security and privacy concerns of performing an evaluation and management service by telephone and the availability of in person appointments. I also discussed with the patient that there may be a patient responsible charge related to this service. The patient expressed understanding and agreed to proceed.   History of Present Illness: Pt presents to the clinic to follow up on shingles. She went to ED on 10/14 for chest pains with negative cardiac work up and then 10/17 for shingles rash. Rash is over her left anterior chest and wraps under her left arm and into back. She was given prednisone and oxycodone in ED. She called office in 10/10 pain and Dr. Zigmund [name] sent 8 more tablets of oxy. She continues to have pain but is improving a little. Vesicles are still present. She is also using pain relieving gels, ibuprofen 800mg  three times a day.     Observations/Objective: Pain observed in patients voice.   Assessment and Plan: Marland KitchenMarland KitchenIyanla was seen today for herpes zoster.  Diagnoses and all orders for this visit:  Herpes zoster without complication  Other orders -     gabapentin (NEURONTIN) 300 MG capsule; TAKE 1 CAPSULE(300 MG) BY MOUTH THREE TIMES a day. -     lidocaine (XYLOCAINE) 5 % ointment; Apply 1 application topically daily. -     ibuprofen (ADVIL) 800 MG tablet; Take 1 tablet (800 mg total) by mouth every 8 (eight) hours as needed. -     oxyCODONE-acetaminophen (PERCOCET/ROXICET) 5-325 MG tablet; Take 1 tablet by mouth every 6 (six) hours as needed for up to 8 doses for severe pain. -     lidocaine (LIDODERM) 5 %; Place 1 patch onto the skin daily.  Remove & Discard patch within 12 hours or as directed by MD   Discussed timeline of shingles and pain.  Oxy refilled for 1 week no more than 3 times a day and trying to use 1-2 times a day.  Discussed abuse potential with long term use.  Marland KitchenMarland KitchenPDMP reviewed during this encounter. Ibuprofen 800mg  TID.  Lidoderm patch and gel ordered.  Start gabapentin 800mg  TID.  Follow up in 1 week.   Follow Up Instructions:    I discussed the assessment and treatment plan with the patient. The patient was provided an opportunity to ask questions and all were answered. The patient agreed with the plan and demonstrated an understanding of the instructions.   The patient was advised to call back or seek an in-person evaluation if the symptoms worsen or if the condition fails to improve as anticipated.  I provided 20 minutes of non-face-to-face time during this encounter.   [name], PA-C

## 2021-06-19 ENCOUNTER — Other Ambulatory Visit: Payer: Self-pay | Admitting: Allergy & Immunology

## 2021-06-21 ENCOUNTER — Ambulatory Visit (INDEPENDENT_AMBULATORY_CARE_PROVIDER_SITE_OTHER): Payer: Medicare Other | Admitting: Physician Assistant

## 2021-06-21 ENCOUNTER — Encounter: Payer: Self-pay | Admitting: Physician Assistant

## 2021-06-21 ENCOUNTER — Other Ambulatory Visit: Payer: Self-pay

## 2021-06-21 VITALS — BP 115/61 | HR 104 | Ht 64.0 in | Wt 135.0 lb

## 2021-06-21 DIAGNOSIS — R3989 Other symptoms and signs involving the genitourinary system: Secondary | ICD-10-CM | POA: Diagnosis not present

## 2021-06-21 DIAGNOSIS — Z0184 Encounter for antibody response examination: Secondary | ICD-10-CM | POA: Diagnosis not present

## 2021-06-21 DIAGNOSIS — Z201 Contact with and (suspected) exposure to tuberculosis: Secondary | ICD-10-CM

## 2021-06-21 DIAGNOSIS — N3001 Acute cystitis with hematuria: Secondary | ICD-10-CM

## 2021-06-21 DIAGNOSIS — Z205 Contact with and (suspected) exposure to viral hepatitis: Secondary | ICD-10-CM

## 2021-06-21 DIAGNOSIS — R8290 Unspecified abnormal findings in urine: Secondary | ICD-10-CM

## 2021-06-21 DIAGNOSIS — B0229 Other postherpetic nervous system involvement: Secondary | ICD-10-CM

## 2021-06-21 DIAGNOSIS — B029 Zoster without complications: Secondary | ICD-10-CM

## 2021-06-21 HISTORY — DX: Other postherpetic nervous system involvement: B02.29

## 2021-06-21 HISTORY — DX: Contact with and (suspected) exposure to tuberculosis: Z20.1

## 2021-06-21 HISTORY — DX: Contact with and (suspected) exposure to viral hepatitis: Z20.5

## 2021-06-21 LAB — POCT URINALYSIS DIP (CLINITEK)
Bilirubin, UA: NEGATIVE
Glucose, UA: NEGATIVE mg/dL
Ketones, POC UA: NEGATIVE mg/dL
Nitrite, UA: NEGATIVE
POC PROTEIN,UA: NEGATIVE
Spec Grav, UA: 1.01 (ref 1.010–1.025)
Urobilinogen, UA: 0.2 E.U./dL
pH, UA: 6 (ref 5.0–8.0)

## 2021-06-21 MED ORDER — NITROFURANTOIN MONOHYD MACRO 100 MG PO CAPS: 100.0000 mg | ORAL_CAPSULE | Freq: Two times a day (BID) | ORAL | 0 refills | Status: DC

## 2021-06-21 MED ORDER — OXYCODONE-ACETAMINOPHEN 5-325 MG PO TABS: 1.0000 | ORAL_TABLET | Freq: Four times a day (QID) | ORAL | 0 refills | Status: DC | PRN

## 2021-06-21 MED ORDER — DICLOFENAC SODIUM 75 MG PO TBEC: 75.0000 mg | DELAYED_RELEASE_TABLET | Freq: Two times a day (BID) | ORAL | 1 refills | Status: DC

## 2021-06-21 MED ORDER — ACYCLOVIR 200 MG PO CAPS: 800.0000 mg | ORAL_CAPSULE | Freq: Every day | ORAL | 0 refills | Status: DC
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
## 2021-06-21 NOTE — Patient Instructions (Addendum)
Start gabapentin up to three times a day.  Use oxycodone as needed.  Start acyclovir  Diclofenac twice a day.   Postherpetic Neuralgia Postherpetic neuralgia (PHN) is nerve pain that occurs after a shingles infection. Shingles is a painful rash that appears on one area of the body, usually on the trunk or face. Shingles is caused by the varicella-zoster virus. This is the same virus that causes chickenpox. In people who have had chickenpox, the virus can resurface years later and cause shingles. PHN appears in the same area where you had the shingles rash. The pain usually goes away after the rash disappears. You may have PHN if you continue to have pain 3 months after your shingles rash has gone away. What are the causes? This condition is caused by damage to your nerves due to inflammation from the varicella-zoster virus. The damage makes your nerves overly sensitive. What increases the risk? The following factors may make you more likely to develop this condition: Being older than 68 years of age. Having severe pain before your shingles rash starts. Having a severe rash. Having shingles in and around the eye area. Having a disease or taking medicine that causes you to have a weakened disease-fighting system (immune system). What are the signs or symptoms? The main symptom of this condition is pain. The pain may: Often be severe and may be described as stabbing, burning, shooting, or feeling like an electric shock. Come and go, or it may be there all the time. Be triggered by light touches on the skin or changes in temperature. You may have itching along with the pain. How is this diagnosed? This condition may be diagnosed based on your symptoms and your history of shingles. Lab studies and other diagnostic tests are usually not needed. How is this treated? There is no cure for this condition. Treatment for PHN will focus on pain relief. Over-the-counter pain relievers do not usually  relieve PHN pain. You may need to work with a pain specialist. Treatment may include: Anti-seizure medicines to relieve nerve pain. Antidepressant medicines to help with pain and improve sleep. A numbing patch worn on the skin (lidocaine patch). Strong pain relievers (opioids). Injections of numbing medicine or anti-inflammatory medicines around irritated nerves. Injections of botulinum toxin to block pain signals between nerves and muscles. Follow these instructions at home: Medicines Take over-the-counter and prescription medicines only as told by your health care provider. Ask your health care provider if the medicine prescribed to you: Requires you to avoid driving or using machinery. Can cause constipation. You may need to take these actions to prevent or treat constipation: Drink enough fluid to keep your urine pale yellow. Take over-the-counter or prescription medicines. Eat foods that are high in fiber, such as beans, whole grains, and fresh fruits and vegetables. Limit foods that are high in fat and processed sugars, such as fried or sweet foods. Managing pain  If directed, put ice on the painful area. To do this: Put ice in a plastic bag. Place a towel between your skin and the bag. Leave the ice on for 20 minutes, 2-3 times a day. Remove the ice if your skin turns bright red. This is very important. If you cannot feel pain, heat, or cold, you have a greater risk of damage to the area. Cover sensitive areas with a bandage (dressing) to reduce friction from clothing rubbing on the area. General instructions It may take a long time to recover from PHN. Work closely with your  health care provider and develop a good support system at home. You may consider joining a support group. Wear loose, comfortable clothing. Talk to your health care provider if you feel depressed or desperate. Living with long-term pain can be depressing. Keep all follow-up visits. This is important. How is  this prevented? Getting a vaccination for shingles can prevent PHN. The shingles vaccine is recommended for people older than age 38. It may prevent shingles and may also lower your risk of PHN if you do get shingles. Contact a health care provider if: Your medicine is not helping. You are struggling to manage your pain at home. Get help right away if: You have thoughts about hurting yourself or others. If you ever feel like you may hurt yourself or others, or have thoughts about taking your own life, get help right away. Go to your nearest emergency department or: Call your local emergency services (911 in the [place] Call a suicide crisis helpline, such as the [place] at 804-330-6499 or 988 in the [place]. This is open 24 hours a day in the [place] Text the Crisis Text Line at (507) 431-0577 (in the [place].). Summary Postherpetic neuralgia (PHN) is a very painful disorder that can occur after an episode of shingles. The pain is often severe and may be described as stabbing, burning, shooting, or feeling like an electric shock. Prescription medicines can be helpful in managing persistent pain. Getting a vaccination for shingles can prevent PHN. This vaccine is recommended for people older than age 51. This information is not intended to replace advice given to you by your health care provider. Make sure you discuss any questions you have with your health care provider. Document Revised: 02/22/2021 Document Reviewed: 07/25/2020 Elsevier Patient Education  2022 [employer].

## 2021-06-22 ENCOUNTER — Encounter: Payer: Self-pay | Admitting: Physician Assistant

## 2021-06-22 MED ORDER — SULFAMETHOXAZOLE-TRIMETHOPRIM 800-160 MG PO TABS: 1.0000 | ORAL_TABLET | Freq: Two times a day (BID) | ORAL | 0 refills | Status: DC

## 2021-06-23 LAB — URINE CULTURE
MICRO NUMBER:: 12618274
SPECIMEN QUALITY:: ADEQUATE

## 2021-06-24 ENCOUNTER — Encounter: Payer: Self-pay | Admitting: Physician Assistant

## 2021-06-26 ENCOUNTER — Encounter: Payer: Self-pay | Admitting: Physician Assistant

## 2021-06-26 NOTE — Progress Notes (Signed)
No significant or worrisome bacteria in urine. The bacteria seen is actually superficial and not indicative for UTI. You can stop antibiotic.

## 2021-06-27 NOTE — Telephone Encounter (Signed)
See if she wants to schedule.

## 2021-06-27 NOTE — Telephone Encounter (Signed)
Patient scheduled.

## 2021-06-28 ENCOUNTER — Ambulatory Visit (INDEPENDENT_AMBULATORY_CARE_PROVIDER_SITE_OTHER): Payer: Self-pay | Admitting: Physician Assistant

## 2021-06-28 DIAGNOSIS — Z91199 Patient's noncompliance with other medical treatment and regimen due to unspecified reason: Secondary | ICD-10-CM

## 2021-06-28 DIAGNOSIS — R300 Dysuria: Secondary | ICD-10-CM

## 2021-06-28 NOTE — Progress Notes (Signed)
No show

## 2021-07-01 ENCOUNTER — Other Ambulatory Visit: Payer: Self-pay | Admitting: Physician Assistant

## 2021-07-01 DIAGNOSIS — B029 Zoster without complications: Secondary | ICD-10-CM

## 2021-07-02 ENCOUNTER — Encounter: Payer: Self-pay | Admitting: Physician Assistant

## 2021-07-02 LAB — QUANTIFERON-TB GOLD PLUS
QuantiFERON Mitogen Value: >10 IU/mL
QuantiFERON Nil Value: 0.05 IU/mL
QuantiFERON TB1 Ag Value: 0.12 IU/mL
QuantiFERON TB2 Ag Value: 0.1 IU/mL
QuantiFERON-TB Gold Plus: NEGATIVE

## 2021-07-02 LAB — HEPATITIS C ANTIBODY: Hep C Virus Ab: <0.1 s/co ratio (ref 0.0–0.9)

## 2021-07-03 ENCOUNTER — Encounter: Payer: Self-pay | Admitting: Physician Assistant

## 2021-07-03 NOTE — Progress Notes (Signed)
TB and hep C negative.

## 2021-07-20 ENCOUNTER — Encounter: Payer: Self-pay | Admitting: Physician Assistant

## 2021-07-20 NOTE — Telephone Encounter (Signed)
Okay to complete?

## 2021-07-21 NOTE — Telephone Encounter (Signed)
Handicap form completed and mailed to patient.

## 2021-07-23 ENCOUNTER — Other Ambulatory Visit: Payer: Self-pay | Admitting: Physician Assistant

## 2021-07-23 DIAGNOSIS — B029 Zoster without complications: Secondary | ICD-10-CM

## 2021-07-23 DIAGNOSIS — B0229 Other postherpetic nervous system involvement: Secondary | ICD-10-CM

## 2021-07-24 ENCOUNTER — Other Ambulatory Visit: Payer: Self-pay | Admitting: Urology

## 2021-07-26 ENCOUNTER — Encounter: Payer: Self-pay | Admitting: Physician Assistant

## 2021-07-27 ENCOUNTER — Encounter: Payer: Self-pay | Admitting: Physician Assistant

## 2021-07-27 DIAGNOSIS — R1312 Dysphagia, oropharyngeal phase: Secondary | ICD-10-CM

## 2021-07-27 DIAGNOSIS — R195 Other fecal abnormalities: Secondary | ICD-10-CM

## 2021-07-31 ENCOUNTER — Other Ambulatory Visit: Payer: Self-pay | Admitting: Physician Assistant

## 2021-07-31 DIAGNOSIS — B029 Zoster without complications: Secondary | ICD-10-CM

## 2021-07-31 MED ORDER — GABAPENTIN 300 MG PO CAPS: ORAL_CAPSULE | ORAL | 2 refills | Status: DC

## 2021-07-31 NOTE — Telephone Encounter (Signed)
Yes I already did the handicap form and let patient know it was ready.

## 2021-07-31 NOTE — Telephone Encounter (Signed)
Cindy please send referral to [facility]

## 2021-07-31 NOTE — Telephone Encounter (Signed)
[place] for ortho [facility] referral for [employer]. Please include MRI results.

## 2021-08-01 ENCOUNTER — Encounter: Payer: Self-pay | Admitting: Physician Assistant

## 2021-08-01 ENCOUNTER — Other Ambulatory Visit: Payer: Self-pay

## 2021-08-01 ENCOUNTER — Ambulatory Visit (INDEPENDENT_AMBULATORY_CARE_PROVIDER_SITE_OTHER): Payer: Medicare Other

## 2021-08-01 DIAGNOSIS — R1312 Dysphagia, oropharyngeal phase: Secondary | ICD-10-CM

## 2021-08-01 MED ORDER — AMOXICILLIN-POT CLAVULANATE 875-125 MG PO TABS: 1.0000 | ORAL_TABLET | Freq: Two times a day (BID) | ORAL | 0 refills | Status: AC

## 2021-08-01 NOTE — Progress Notes (Signed)
You do have thyroid nodules but none of them meet criteria for biopsy.

## 2021-08-02 ENCOUNTER — Telehealth (INDEPENDENT_AMBULATORY_CARE_PROVIDER_SITE_OTHER): Payer: Medicare Other | Admitting: Physician Assistant

## 2021-08-02 ENCOUNTER — Encounter: Payer: Self-pay | Admitting: Physician Assistant

## 2021-08-02 DIAGNOSIS — E042 Nontoxic multinodular goiter: Secondary | ICD-10-CM | POA: Diagnosis not present

## 2021-08-02 DIAGNOSIS — R1312 Dysphagia, oropharyngeal phase: Secondary | ICD-10-CM

## 2021-08-02 DIAGNOSIS — Z8719 Personal history of other diseases of the digestive system: Secondary | ICD-10-CM | POA: Diagnosis not present
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
## 2021-08-08 ENCOUNTER — Encounter: Payer: Self-pay | Admitting: Physician Assistant

## 2021-08-09 NOTE — Telephone Encounter (Signed)
Ok that has been ordered.   Jamie [name] can we make sure everything was ordered correctly.

## 2021-08-09 NOTE — Telephone Encounter (Signed)
She wants a swallow test before she sees GI.

## 2021-08-10 ENCOUNTER — Other Ambulatory Visit: Payer: Self-pay | Admitting: Physician Assistant

## 2021-08-10 DIAGNOSIS — R1312 Dysphagia, oropharyngeal phase: Secondary | ICD-10-CM

## 2021-08-12 ENCOUNTER — Encounter: Payer: Self-pay | Admitting: Physician Assistant

## 2021-08-12 ENCOUNTER — Other Ambulatory Visit: Payer: Self-pay | Admitting: Physician Assistant

## 2021-08-12 DIAGNOSIS — B029 Zoster without complications: Secondary | ICD-10-CM

## 2021-08-12 DIAGNOSIS — B0229 Other postherpetic nervous system involvement: Secondary | ICD-10-CM

## 2021-08-15 MED ORDER — PEPCID 20 MG PO TABS: 20.0000 mg | ORAL_TABLET | Freq: Two times a day (BID) | ORAL | 4 refills | Status: DC

## 2021-08-15 MED ORDER — ACYCLOVIR 200 MG PO CAPS: 800.0000 mg | ORAL_CAPSULE | Freq: Every day | ORAL | 0 refills | Status: DC

## 2021-08-18 ENCOUNTER — Encounter (HOSPITAL_COMMUNITY): Payer: Medicare Other

## 2021-08-18 ENCOUNTER — Ambulatory Visit (HOSPITAL_COMMUNITY): Payer: Medicare Other

## 2021-08-18 ENCOUNTER — Encounter (HOSPITAL_COMMUNITY): Payer: Self-pay

## 2021-08-18 NOTE — Progress Notes (Signed)
Sent message, via epic in basket, requesting orders in epic from surgeon.  

## 2021-08-21 ENCOUNTER — Encounter (HOSPITAL_COMMUNITY): Payer: Self-pay

## 2021-08-21 ENCOUNTER — Ambulatory Visit (HOSPITAL_COMMUNITY)
Admission: RE | Admit: 2021-08-21 | Discharge: 2021-08-21 | Disposition: A | Discharge status: Home / Self Care | Payer: Medicare Other | Source: Ambulatory Visit | Attending: Physician Assistant | Admitting: Physician Assistant

## 2021-08-21 ENCOUNTER — Other Ambulatory Visit: Payer: Self-pay

## 2021-08-21 DIAGNOSIS — R1312 Dysphagia, oropharyngeal phase: Secondary | ICD-10-CM | POA: Diagnosis present

## 2021-08-21 NOTE — Therapy (Signed)
Modified Barium Swallow Progress Note  Patient Details  Name: Jamie [name] MRN: [id number] Date of Birth: [birth year]  Today's Date: 08/21/2021  Modified Barium Swallow completed.  Full report located under Chart Review in the Imaging Section.  Brief recommendations include the following:  Clinical Impression  Patient presents with an oropharyngeal swallow that is WNL as per this MBS. No instances of oral or pharyngeal delay in transit of puree, thin liquid barium or barium tablet. No pharyngeal residuals post initial swallows, no penetration or aspiration of barium. SLP did not observe anything that would explain her complaints.   Swallow Evaluation Recommendations       SLP Diet Recommendations: Regular solids;Thin liquid   Liquid Administration via: Cup;Straw   Medication Administration: Whole meds with liquid   Supervision: Patient able to self feed       Postural Changes: Seated upright at 90 degrees   Oral Care Recommendations: Patient independent with oral care   Sonia Baller, MA, CCC-SLP Speech Therapy

## 2021-08-21 NOTE — Progress Notes (Signed)
Swallow testing within normal limits.

## 2021-08-22 ENCOUNTER — Ambulatory Visit: Payer: Self-pay | Admitting: Surgery

## 2021-08-22 DIAGNOSIS — Z01818 Encounter for other preprocedural examination: Secondary | ICD-10-CM

## 2021-08-25 NOTE — Progress Notes (Addendum)
Anesthesia Review:  PCP: [name] Last visit 08/02/21- Video visit  Cardiologist : none  Chest x-ray : 05/26/21 and ct chest on 05/29/21  EKG :05/30/21  02/23/21- carotids  Echo : Stress test: Cardiac Cath :  Activity level:  can do a flgiht of stairs without difficulty  Sleep Study/ CPAP : none  Fasting Blood Sugar :      / Checks Blood Sugar -- times a day:   Blood Thinner/ Instructions /Last Dose: ASA / Instructions/ Last Dose :   Prediabetic - does not hceck glucose at [facility]  Hgba1c- 08/29/21- 5.1  Covid test on 09/06/21 at 1200 noon  Pt came to preop and asking many questions.  PT could tell me she was having surgery with DR [name] and that it was colon surgery for diverticulitis.  PT wanted a copy of consent form .  PT was not aware DR Jeffie Pollock was doing any surgery.  PT thought she was coming in for a preop to go over what the surgeon was going to do.  Explained preop appt to pt.  Sister, Dorian Pod was in waiting room .  Brought sharon Smith into preop appt pt was unsure of she had bowel prep instructions at [facility] .  Called [facility] and spoke with [place].  Elmyra Ricks faxed over bowel prep instructions. Copy placed on chart and copy each given to pt and her sister.  PT stated she cannot take Neomycin.  Informed Elmyra Ricks of this at  time of call.  Elmyra Ricks stated this was on backorder at pharmacies but if pt did get from pharmacy not to take and that she would make a note at [place] office that pt was not goin to take neomycin.  Instructed sister to call office of [facility] and let them know sister has questions regarding surgery but she needs to be present when things are discussed with sister.  PT seems forgetful and easy to get distracted.  Sister will need to be present on day of surgery in [place].   Pt reports at preop she is recovering from shingles on left side and still has referred pain to left breast .  PT stated DR [name] is aware of shingles. Called Niole back at [facility] and made aware that pt and  sister have copies of bowel prep instructions and she has sent a note that pt has questions for DRWhite prior to surgery and ot DR Jeffie Pollock.  Also aware that pt states shingles has not yet completely healed and that she has referred pain to left breast from the shingles.  Elmyra Ricks stated she would make Dr [name] aware.     Glucose was 77 at time of preop.  PT voiced no complaints the patient given 6 pack of peanut butter crackers to eat on way home.

## 2021-08-28 NOTE — Progress Notes (Signed)
Covid test on 09/06/21 at 1200 noon.  Come thru main entrance at [facility],  have a seat in the lobby to the right.  Call [phone] and give them your name and let them know you are here for covid testing                Jamie [name]  08/28/2021   Your procedure is scheduled on:    09/08/2021.    Report to [facility] Main  Entrance   Report to admitting at   [phone]     Call this number if you have problems the morning of surgery [phone]           REMEMBER: FOLLOW ALL YOUR BOWEL PREP INSTRUCTIONS WITH CLEAR LIQUIDS FROM YOUR SURGEON'S INSTRUCTIONS. DRINK 2 PRESURGERY ENSURE DRINKS THE NIGHT BEFORE SURGERY AT 1000 PM AND 1 PRESURGERY DRINK THE DAY OF THE PROCEDURE 3 HOURS PRIOR TO SCHEDULED SURGERY.  NOTHING BY MOUTH EXCEPT CLEAR LIQUIDS UNTIL THREE HOURS PRIOR TO SCHEDULED SURGERY. PLEASE FINISH PRESURGERY 3RD  ENSURE  DRINK PER SURGEON ORDER 3 HOURS PRIOR TO SCHEDULED SURGERY TIME WHICH NEEDS TO BE COMPLETED AT ___0430am ______.     CLEAR LIQUID DIET   Foods Allowed                                                                      Coffee and tea, regular and decaf                           Plain Jell-O any favor except red or purple                                         Fruit ices (not with fruit pulp)                                      Iced Popsicles                                     Carbonated beverages, regular and diet                                    Cranberry, grape and apple juices Sports drinks like Gatorade Lightly seasoned clear broth or consume(fat free) Sugar  _____________________________________________________________________      BRUSH YOUR TEETH MORNING OF SURGERY AND RINSE YOUR MOUTH OUT, NO CHEWING GUM CANDY OR MINTS.     Take these medicines the morning of surgery with A SIP OF WATER:  omeprazole   DO NOT TAKE ANY DIABETIC MEDICATIONS DAY OF YOUR SURGERY                               You may not have any metal on your body  including hair pins and  piercings  Do not wear jewelry, make-up, lotions, powders or perfumes, deodorant             Do not wear nail polish on your fingernails.  Do not shave  48 hours prior to surgery.              Men may shave face and neck.   Do not bring valuables to the hospital. [place].  Contacts, dentures or bridgework may not be worn into surgery.  Leave suitcase in the car. After surgery it may be brought to your room.                 Please read over the following fact sheets you were given: _____________________________________________________________________  [facility] - Preparing for Surgery Before surgery, you can play an important role.  Because skin is not sterile, your skin needs to be as free of germs as possible.  You can reduce the number of germs on your skin by washing with CHG (chlorahexidine gluconate) soap before surgery.  CHG is an antiseptic cleaner which kills germs and bonds with the skin to continue killing germs even after washing. Please DO NOT use if you have an allergy to CHG or antibacterial soaps.  If your skin becomes reddened/irritated stop using the CHG and inform your nurse when you arrive at Short Stay. Do not shave (including legs and underarms) for at least 48 hours prior to the first CHG shower.  You may shave your face/neck. Please follow these instructions carefully:  1.  Shower with CHG Soap the night before surgery and the  morning of Surgery.  2.  If you choose to wash your hair, wash your hair first as usual with your  normal  shampoo.  3.  After you shampoo, rinse your hair and body thoroughly to remove the  shampoo.                           4.  Use CHG as you would any other liquid soap.  You can apply chg directly  to the skin and wash                       Gently with a scrungie or clean washcloth.  5.  Apply the CHG Soap to your body ONLY FROM THE NECK DOWN.   Do not use  on face/ open                           Wound or open sores. Avoid contact with eyes, ears mouth and genitals (private parts).                       Wash face,  Genitals (private parts) with your normal soap.             6.  Wash thoroughly, paying special attention to the area where your surgery  will be performed.  7.  Thoroughly rinse your body with warm water from the neck down.  8.  DO NOT shower/wash with your normal soap after using and rinsing off  the CHG Soap.                9.  Pat yourself dry with a clean towel.  10.  Wear clean pajamas.            11.  Place clean sheets on your bed the night of your first shower and do not  sleep with pets. Day of Surgery : Do not apply any lotions/deodorants the morning of surgery.  Please wear clean clothes to the hospital/surgery center.  FAILURE TO FOLLOW THESE INSTRUCTIONS MAY RESULT IN THE CANCELLATION OF YOUR SURGERY PATIENT SIGNATURE_________________________________  NURSE SIGNATURE__________________________________  ________________________________________________________________________

## 2021-08-29 ENCOUNTER — Other Ambulatory Visit: Payer: Self-pay

## 2021-08-29 ENCOUNTER — Encounter: Payer: Self-pay | Admitting: Physician Assistant

## 2021-08-29 ENCOUNTER — Encounter (HOSPITAL_COMMUNITY)
Admission: RE | Admit: 2021-08-29 | Discharge: 2021-08-29 | Disposition: A | Discharge status: Home / Self Care | Payer: Medicare Other | Source: Ambulatory Visit | Attending: Surgery | Admitting: Surgery

## 2021-08-29 ENCOUNTER — Encounter (HOSPITAL_COMMUNITY): Payer: Self-pay

## 2021-08-29 VITALS — BP 128/73 | HR 97 | Temp 98.6°F | Resp 16 | Ht 65.0 in | Wt 136.0 lb

## 2021-08-29 DIAGNOSIS — Z01812 Encounter for preprocedural laboratory examination: Secondary | ICD-10-CM | POA: Diagnosis present

## 2021-08-29 DIAGNOSIS — Z01818 Encounter for other preprocedural examination: Secondary | ICD-10-CM

## 2021-08-29 HISTORY — DX: Hypothyroidism, unspecified: E03.9

## 2021-08-29 HISTORY — DX: Pneumonia, unspecified organism: J18.9

## 2021-08-29 HISTORY — DX: Dyspnea, unspecified: R06.00

## 2021-08-29 HISTORY — DX: Prediabetes: R73.03

## 2021-08-29 LAB — HEMOGLOBIN A1C
Hgb A1c MFr Bld: 5.1 % (ref 4.8–5.6)
Mean Plasma Glucose: 99.67 mg/dL

## 2021-08-29 LAB — CBC WITH DIFFERENTIAL/PLATELET
Abs Immature Granulocytes: 0.02 10*3/uL (ref 0.00–0.07)
Basophils Absolute: 0.1 10*3/uL (ref 0.0–0.1)
Basophils Relative: 1 %
Eosinophils Absolute: 0 10*3/uL (ref 0.0–0.5)
Eosinophils Relative: 1 %
HCT: 38.1 % (ref 36.0–46.0)
Hemoglobin: 13.2 g/dL (ref 12.0–15.0)
Immature Granulocytes: 0 %
Lymphocytes Relative: 21 %
Lymphs Abs: 1.2 10*3/uL (ref 0.7–4.0)
MCH: 32.1 pg (ref 26.0–34.0)
MCHC: 34.6 g/dL (ref 30.0–36.0)
MCV: 92.7 fL (ref 80.0–100.0)
Monocytes Absolute: 0.6 10*3/uL (ref 0.1–1.0)
Monocytes Relative: 10 %
Neutro Abs: 3.8 10*3/uL (ref 1.7–7.7)
Neutrophils Relative %: 67 %
Platelets: 286 10*3/uL (ref 150–400)
RBC: 4.11 MIL/uL (ref 3.87–5.11)
RDW: 12.3 % (ref 11.5–15.5)
WBC: 5.7 10*3/uL (ref 4.0–10.5)
nRBC: 0 % (ref 0.0–0.2)

## 2021-08-29 LAB — COMPREHENSIVE METABOLIC PANEL
ALT: 20 U/L (ref 0–44)
AST: 29 U/L (ref 15–41)
Albumin: 4.1 g/dL (ref 3.5–5.0)
Alkaline Phosphatase: 65 U/L (ref 38–126)
Anion gap: 4 — ABNORMAL LOW (ref 5–15)
BUN: 13 mg/dL (ref 8–23)
CO2: 29 mmol/L (ref 22–32)
Calcium: 9.2 mg/dL (ref 8.9–10.3)
Chloride: 105 mmol/L (ref 98–111)
Creatinine, Ser: 0.97 mg/dL (ref 0.44–1.00)
GFR, Estimated: >60 mL/min (ref >60)
Glucose, Bld: 90 mg/dL (ref 70–99)
Potassium: 4.7 mmol/L (ref 3.5–5.1)
Sodium: 138 mmol/L (ref 135–145)
Total Bilirubin: 0.7 mg/dL (ref 0.3–1.2)
Total Protein: 6.9 g/dL (ref 6.5–8.1)

## 2021-08-29 LAB — GLUCOSE, CAPILLARY: Glucose-Capillary: 77 mg/dL (ref 70–99)

## 2021-08-29 NOTE — Addendum Note (Signed)
Addended byAnnamaria Helling on: 08/29/2021 10:02 AM   Modules accepted: Orders

## 2021-08-29 NOTE — Progress Notes (Addendum)
Called and spoke with Alecia in Triage at [place] and asked did DR [facility] want an ostomy  nurse consult.  There is no order in epic and it was not done at preop.  Alecia to ask Dr white and return call.   Shameer  ( triage at [facility]) called back on 08/30/21 and stated no ostomy nurse consult  is needed for this pt.

## 2021-09-01 ENCOUNTER — Encounter: Payer: Self-pay | Admitting: Physician Assistant

## 2021-09-06 ENCOUNTER — Other Ambulatory Visit: Payer: Self-pay

## 2021-09-06 ENCOUNTER — Encounter (HOSPITAL_COMMUNITY)
Admission: RE | Admit: 2021-09-06 | Discharge: 2021-09-06 | Disposition: A | Discharge status: Home / Self Care | Payer: Medicare Other | Source: Ambulatory Visit | Attending: Surgery | Admitting: Surgery

## 2021-09-06 DIAGNOSIS — Z20822 Contact with and (suspected) exposure to covid-19: Secondary | ICD-10-CM | POA: Insufficient documentation

## 2021-09-06 DIAGNOSIS — Z01812 Encounter for preprocedural laboratory examination: Secondary | ICD-10-CM | POA: Insufficient documentation

## 2021-09-06 DIAGNOSIS — Z01818 Encounter for other preprocedural examination: Secondary | ICD-10-CM

## 2021-09-06 LAB — SARS CORONAVIRUS 2 (TAT 6-24 HRS): SARS Coronavirus 2: NEGATIVE

## 2021-09-07 NOTE — Anesthesia Preprocedure Evaluation (Addendum)
Anesthesia Evaluation  Patient identified by MRN, date of birth, ID band Patient awake    Reviewed: Allergy & Precautions, NPO status , Patient's Chart, lab work & pertinent test results  History of Anesthesia Complications Negative for: history of anesthetic complications  Airway Mallampati: I  TM Distance: >3 FB Neck ROM: Full    Dental  (+) Missing, Caps, Dental Advisory Given   Pulmonary COPD,  COPD inhaler, former smoker,  09/06/2021 SARS coronavirus NEG   breath sounds clear to auscultation       Cardiovascular negative cardio ROS   Rhythm:Regular Rate:Normal  '16 ECHO: EF 55-60%, Normal LV systolic function; grade 1 DD; trace MR and TR   Neuro/Psych  Headaches, Anxiety Depression    GI/Hepatic Neg liver ROS, GERD  Medicated and Controlled,colovesicular fistula   Endo/Other  Hypothyroidism   Renal/GU negative Renal ROS     Musculoskeletal  (+) Arthritis ,   Abdominal   Peds  Hematology negative hematology ROS (+)   Anesthesia Other Findings   Reproductive/Obstetrics                            Anesthesia Physical Anesthesia Plan  ASA: 2  Anesthesia Plan: General   Post-op Pain Management: Tylenol PO (pre-op)   Induction:   PONV Risk Score and Plan: 3 and Ondansetron, Dexamethasone and Treatment may vary due to age or medical condition  Airway Management Planned: Oral ETT  Additional Equipment: None  Intra-op Plan:   Post-operative Plan: Extubation in OR  Informed Consent: I have reviewed the patients History and Physical, chart, labs and discussed the procedure including the risks, benefits and alternatives for the proposed anesthesia with the patient or authorized representative who has indicated his/her understanding and acceptance.     Dental advisory given  Plan Discussed with: CRNA and Surgeon  Anesthesia Plan Comments:        Anesthesia Quick  Evaluation

## 2021-09-08 ENCOUNTER — Encounter (HOSPITAL_COMMUNITY): Payer: Self-pay | Admitting: Surgery

## 2021-09-08 ENCOUNTER — Other Ambulatory Visit: Payer: Self-pay

## 2021-09-08 ENCOUNTER — Inpatient Hospital Stay (HOSPITAL_COMMUNITY): Payer: Medicare Other | Admitting: Certified Registered Nurse Anesthetist

## 2021-09-08 ENCOUNTER — Inpatient Hospital Stay (HOSPITAL_COMMUNITY): Payer: Medicare Other | Admitting: Physician Assistant

## 2021-09-08 ENCOUNTER — Encounter (HOSPITAL_COMMUNITY)
Admission: RE | Disposition: A | Discharge status: Home / Self Care | Payer: Self-pay | Source: Ambulatory Visit | Attending: Surgery

## 2021-09-08 ENCOUNTER — Inpatient Hospital Stay (HOSPITAL_COMMUNITY): Payer: Medicare Other

## 2021-09-08 ENCOUNTER — Inpatient Hospital Stay (HOSPITAL_COMMUNITY)
Admission: RE | Admit: 2021-09-08 | Discharge: 2021-09-11 | DRG: 660 | Disposition: A | Discharge status: Home / Self Care | Payer: Medicare Other | Source: Ambulatory Visit | Attending: Surgery | Admitting: Surgery

## 2021-09-08 DIAGNOSIS — I10 Essential (primary) hypertension: Secondary | ICD-10-CM | POA: Diagnosis present

## 2021-09-08 DIAGNOSIS — Z800 Family history of malignant neoplasm of digestive organs: Secondary | ICD-10-CM | POA: Diagnosis not present

## 2021-09-08 DIAGNOSIS — L42 Pityriasis rosea: Secondary | ICD-10-CM | POA: Diagnosis present

## 2021-09-08 DIAGNOSIS — K660 Peritoneal adhesions (postprocedural) (postinfection): Secondary | ICD-10-CM | POA: Diagnosis present

## 2021-09-08 DIAGNOSIS — N321 Vesicointestinal fistula: Secondary | ICD-10-CM | POA: Diagnosis present

## 2021-09-08 DIAGNOSIS — E119 Type 2 diabetes mellitus without complications: Secondary | ICD-10-CM | POA: Diagnosis present

## 2021-09-08 DIAGNOSIS — Z881 Allergy status to other antibiotic agents status: Secondary | ICD-10-CM

## 2021-09-08 DIAGNOSIS — Z8261 Family history of arthritis: Secondary | ICD-10-CM

## 2021-09-08 DIAGNOSIS — Z90710 Acquired absence of both cervix and uterus: Secondary | ICD-10-CM | POA: Diagnosis not present

## 2021-09-08 DIAGNOSIS — K219 Gastro-esophageal reflux disease without esophagitis: Secondary | ICD-10-CM | POA: Diagnosis present

## 2021-09-08 DIAGNOSIS — Z825 Family history of asthma and other chronic lower respiratory diseases: Secondary | ICD-10-CM | POA: Diagnosis not present

## 2021-09-08 DIAGNOSIS — Z8249 Family history of ischemic heart disease and other diseases of the circulatory system: Secondary | ICD-10-CM

## 2021-09-08 DIAGNOSIS — Z91013 Allergy to seafood: Secondary | ICD-10-CM

## 2021-09-08 DIAGNOSIS — B359 Dermatophytosis, unspecified: Secondary | ICD-10-CM | POA: Diagnosis present

## 2021-09-08 DIAGNOSIS — Z888 Allergy status to other drugs, medicaments and biological substances status: Secondary | ICD-10-CM

## 2021-09-08 DIAGNOSIS — K5780 Diverticulitis of intestine, part unspecified, with perforation and abscess without bleeding: Secondary | ICD-10-CM | POA: Diagnosis present

## 2021-09-08 DIAGNOSIS — Z9049 Acquired absence of other specified parts of digestive tract: Secondary | ICD-10-CM

## 2021-09-08 DIAGNOSIS — Z833 Family history of diabetes mellitus: Secondary | ICD-10-CM

## 2021-09-08 DIAGNOSIS — Z806 Family history of leukemia: Secondary | ICD-10-CM

## 2021-09-08 DIAGNOSIS — Z8049 Family history of malignant neoplasm of other genital organs: Secondary | ICD-10-CM

## 2021-09-08 DIAGNOSIS — Z20822 Contact with and (suspected) exposure to covid-19: Secondary | ICD-10-CM | POA: Diagnosis present

## 2021-09-08 DIAGNOSIS — Z87891 Personal history of nicotine dependence: Secondary | ICD-10-CM

## 2021-09-08 DIAGNOSIS — Z811 Family history of alcohol abuse and dependence: Secondary | ICD-10-CM | POA: Diagnosis not present

## 2021-09-08 DIAGNOSIS — Z91041 Radiographic dye allergy status: Secondary | ICD-10-CM | POA: Diagnosis not present

## 2021-09-08 DIAGNOSIS — K5732 Diverticulitis of large intestine without perforation or abscess without bleeding: Secondary | ICD-10-CM | POA: Diagnosis present

## 2021-09-08 DIAGNOSIS — E039 Hypothyroidism, unspecified: Secondary | ICD-10-CM | POA: Diagnosis present

## 2021-09-08 HISTORY — PX: CYSTOSCOPY WITH STENT PLACEMENT: SHX5790

## 2021-09-08 HISTORY — PX: LYSIS OF ADHESION: SHX5961

## 2021-09-08 HISTORY — PX: FLEXIBLE SIGMOIDOSCOPY: SHX5431

## 2021-09-08 HISTORY — DX: Acquired absence of other specified parts of digestive tract: Z90.49

## 2021-09-08 LAB — GLUCOSE, CAPILLARY
Glucose-Capillary: 105 mg/dL — ABNORMAL HIGH (ref 70–99)
Glucose-Capillary: 108 mg/dL — ABNORMAL HIGH (ref 70–99)
Glucose-Capillary: 130 mg/dL — ABNORMAL HIGH (ref 70–99)
Glucose-Capillary: 184 mg/dL — ABNORMAL HIGH (ref 70–99)
Glucose-Capillary: 196 mg/dL — ABNORMAL HIGH (ref 70–99)

## 2021-09-08 LAB — TYPE AND SCREEN
ABO/RH(D): A POS
Antibody Screen: NEGATIVE

## 2021-09-08 LAB — ABO/RH: ABO/RH(D): A POS

## 2021-09-08 SURGERY — COLECTOMY, SIGMOID, ROBOT-ASSISTED
Anesthesia: General

## 2021-09-08 MED ORDER — ALBUTEROL SULFATE (2.5 MG/3ML) 0.083% IN NEBU: 2.5000 mg | INHALATION_SOLUTION | Freq: Four times a day (QID) | RESPIRATORY_TRACT | Status: DC | PRN

## 2021-09-08 MED ORDER — PROPOFOL 10 MG/ML IV BOLUS
INTRAVENOUS | Status: AC
  Filled 2021-09-08: qty 20

## 2021-09-08 MED ORDER — CYCLOBENZAPRINE HCL 5 MG PO TABS: 5.0000 mg | ORAL_TABLET | Freq: Two times a day (BID) | ORAL | Status: DC | PRN

## 2021-09-08 MED ORDER — PANTOPRAZOLE SODIUM 40 MG PO TBEC
40.0000 mg | DELAYED_RELEASE_TABLET | Freq: Every day | ORAL | Status: DC
  Administered 2021-09-09 – 2021-09-11 (×3): 40 mg via ORAL
  Filled 2021-09-08 (×3): qty 1

## 2021-09-08 MED ORDER — BUPIVACAINE LIPOSOME 1.3 % IJ SUSP
INTRAMUSCULAR | Status: DC | PRN
  Administered 2021-09-08: 20 mL

## 2021-09-08 MED ORDER — HYDROMORPHONE HCL 1 MG/ML IJ SOLN
INTRAMUSCULAR | Status: AC
  Filled 2021-09-08: qty 2

## 2021-09-08 MED ORDER — LACTATED RINGERS IV SOLN
INTRAVENOUS | Status: DC
  Administered 2021-09-08: 1000 mL via INTRAVENOUS

## 2021-09-08 MED ORDER — DEXAMETHASONE SODIUM PHOSPHATE 10 MG/ML IJ SOLN
INTRAMUSCULAR | Status: AC
  Filled 2021-09-08: qty 1

## 2021-09-08 MED ORDER — MIDAZOLAM HCL 5 MG/5ML IJ SOLN
INTRAMUSCULAR | Status: DC | PRN
  Administered 2021-09-08 (×2): 1 mg via INTRAVENOUS

## 2021-09-08 MED ORDER — BUPIVACAINE-EPINEPHRINE (PF) 0.25% -1:200000 IJ SOLN
INTRAMUSCULAR | Status: AC
  Filled 2021-09-08: qty 30

## 2021-09-08 MED ORDER — MIDAZOLAM HCL 2 MG/2ML IJ SOLN: 0.5000 mg | Freq: Once | INTRAMUSCULAR | Status: DC | PRN

## 2021-09-08 MED ORDER — ENSURE PRE-SURGERY PO LIQD
592.0000 mL | Freq: Once | ORAL | Status: DC
  Filled 2021-09-08: qty 592

## 2021-09-08 MED ORDER — TRAMADOL HCL 50 MG PO TABS: 50.0000 mg | ORAL_TABLET | Freq: Four times a day (QID) | ORAL | Status: DC | PRN

## 2021-09-08 MED ORDER — FA-PYRIDOXINE-CYANOCOBALAMIN 2.5-25-2 MG PO TABS
1.0000 | ORAL_TABLET | Freq: Two times a day (BID) | ORAL | Status: DC
  Administered 2021-09-08 – 2021-09-11 (×7): 1 via ORAL
  Filled 2021-09-08 (×6): qty 1

## 2021-09-08 MED ORDER — FENTANYL CITRATE (PF) 250 MCG/5ML IJ SOLN
INTRAMUSCULAR | Status: DC | PRN
  Administered 2021-09-08: 50 ug via INTRAVENOUS
  Administered 2021-09-08: 250 ug via INTRAVENOUS
  Administered 2021-09-08: 50 ug via INTRAVENOUS

## 2021-09-08 MED ORDER — ALVIMOPAN 12 MG PO CAPS
12.0000 mg | ORAL_CAPSULE | ORAL | Status: AC
  Administered 2021-09-08: 12 mg via ORAL
  Filled 2021-09-08: qty 1

## 2021-09-08 MED ORDER — ALVIMOPAN 12 MG PO CAPS
12.0000 mg | ORAL_CAPSULE | Freq: Two times a day (BID) | ORAL | Status: DC
  Administered 2021-09-09 – 2021-09-10 (×4): 12 mg via ORAL
  Filled 2021-09-08 (×4): qty 1

## 2021-09-08 MED ORDER — ALUM & MAG HYDROXIDE-SIMETH 200-200-20 MG/5ML PO SUSP: 30.0000 mL | Freq: Four times a day (QID) | ORAL | Status: DC | PRN

## 2021-09-08 MED ORDER — INSULIN ASPART 100 UNIT/ML IJ SOLN
0.0000 [IU] | Freq: Three times a day (TID) | INTRAMUSCULAR | Status: DC
  Administered 2021-09-08: 2 [IU] via SUBCUTANEOUS

## 2021-09-08 MED ORDER — VITAMIN D 25 MCG (1000 UNIT) PO TABS
1000.0000 [IU] | ORAL_TABLET | Freq: Every day | ORAL | Status: DC
  Administered 2021-09-09 – 2021-09-11 (×3): 1000 [IU] via ORAL
  Filled 2021-09-08 (×3): qty 1

## 2021-09-08 MED ORDER — LACTATED RINGERS IV SOLN: INTRAVENOUS | Status: DC

## 2021-09-08 MED ORDER — GLUCERNA SHAKE PO LIQD
237.0000 mL | Freq: Two times a day (BID) | ORAL | Status: DC
  Filled 2021-09-08 (×7): qty 237

## 2021-09-08 MED ORDER — POLYETHYLENE GLYCOL 3350 17 GM/SCOOP PO POWD: 1.0000 | Freq: Once | ORAL | Status: DC

## 2021-09-08 MED ORDER — GABAPENTIN 300 MG PO CAPS: 300.0000 mg | ORAL_CAPSULE | Freq: Every evening | ORAL | Status: DC | PRN

## 2021-09-08 MED ORDER — ONDANSETRON HCL 4 MG/2ML IJ SOLN
INTRAMUSCULAR | Status: AC
  Filled 2021-09-08: qty 2

## 2021-09-08 MED ORDER — ALPRAZOLAM 0.5 MG PO TABS
0.5000 mg | ORAL_TABLET | Freq: Every evening | ORAL | Status: DC | PRN
  Administered 2021-09-11: 0.5 mg via ORAL
  Filled 2021-09-08: qty 1

## 2021-09-08 MED ORDER — SODIUM CHLORIDE 0.9 % IV SOLN
2.0000 g | INTRAVENOUS | Status: AC
  Administered 2021-09-08: 2 g via INTRAVENOUS
  Filled 2021-09-08: qty 2

## 2021-09-08 MED ORDER — MELATONIN 3 MG PO TABS: 3.0000 mg | ORAL_TABLET | Freq: Every evening | ORAL | Status: DC | PRN

## 2021-09-08 MED ORDER — STERILE WATER FOR INJECTION IJ SOLN
INTRAMUSCULAR | Status: AC
  Filled 2021-09-08: qty 10

## 2021-09-08 MED ORDER — FENTANYL CITRATE (PF) 100 MCG/2ML IJ SOLN
INTRAMUSCULAR | Status: AC
  Filled 2021-09-08: qty 2

## 2021-09-08 MED ORDER — HEPARIN SODIUM (PORCINE) 5000 UNIT/ML IJ SOLN
5000.0000 [IU] | Freq: Once | INTRAMUSCULAR | Status: AC
  Administered 2021-09-08: 5000 [IU] via SUBCUTANEOUS
  Filled 2021-09-08: qty 1

## 2021-09-08 MED ORDER — ONDANSETRON HCL 4 MG PO TABS
4.0000 mg | ORAL_TABLET | Freq: Four times a day (QID) | ORAL | Status: DC | PRN
  Administered 2021-09-10: 4 mg via ORAL
  Filled 2021-09-08: qty 1

## 2021-09-08 MED ORDER — LIP MEDEX EX OINT
TOPICAL_OINTMENT | CUTANEOUS | Status: DC | PRN
  Administered 2021-09-08: 1 via TOPICAL
  Filled 2021-09-08: qty 7

## 2021-09-08 MED ORDER — NEOMYCIN SULFATE 500 MG PO TABS: 1000.0000 mg | ORAL_TABLET | ORAL | Status: DC

## 2021-09-08 MED ORDER — MEPERIDINE HCL 50 MG/ML IJ SOLN: 6.2500 mg | INTRAMUSCULAR | Status: DC | PRN

## 2021-09-08 MED ORDER — PROMETHAZINE HCL 25 MG/ML IJ SOLN: 6.2500 mg | INTRAMUSCULAR | Status: DC | PRN

## 2021-09-08 MED ORDER — OXYCODONE HCL 5 MG/5ML PO SOLN: 5.0000 mg | Freq: Once | ORAL | Status: DC | PRN

## 2021-09-08 MED ORDER — DIPHENHYDRAMINE HCL 12.5 MG/5ML PO ELIX: 12.5000 mg | ORAL_SOLUTION | Freq: Four times a day (QID) | ORAL | Status: DC | PRN

## 2021-09-08 MED ORDER — BUPIVACAINE LIPOSOME 1.3 % IJ SUSP: 20.0000 mL | Freq: Once | INTRAMUSCULAR | Status: DC

## 2021-09-08 MED ORDER — BUPIVACAINE LIPOSOME 1.3 % IJ SUSP
INTRAMUSCULAR | Status: AC
  Filled 2021-09-08: qty 20

## 2021-09-08 MED ORDER — LIDOCAINE 2% (20 MG/ML) 5 ML SYRINGE
INTRAMUSCULAR | Status: DC | PRN
  Administered 2021-09-08: 40 mg via INTRAVENOUS

## 2021-09-08 MED ORDER — CHLORHEXIDINE GLUCONATE 0.12 % MT SOLN
15.0000 mL | Freq: Once | OROMUCOSAL | Status: AC
  Administered 2021-09-08: 15 mL via OROMUCOSAL

## 2021-09-08 MED ORDER — METHYLENE BLUE 0.5 % INJ SOLN
INTRAVENOUS | Status: AC
  Filled 2021-09-08: qty 10

## 2021-09-08 MED ORDER — ACETAMINOPHEN 500 MG PO TABS
1000.0000 mg | ORAL_TABLET | Freq: Four times a day (QID) | ORAL | Status: DC
  Administered 2021-09-08 – 2021-09-10 (×8): 1000 mg via ORAL
  Filled 2021-09-08 (×10): qty 2

## 2021-09-08 MED ORDER — LIDOCAINE 2% (20 MG/ML) 5 ML SYRINGE: INTRAMUSCULAR | Status: DC | PRN

## 2021-09-08 MED ORDER — PHENYLEPHRINE 40 MCG/ML (10ML) SYRINGE FOR IV PUSH (FOR BLOOD PRESSURE SUPPORT)
PREFILLED_SYRINGE | INTRAVENOUS | Status: AC
  Filled 2021-09-08: qty 10

## 2021-09-08 MED ORDER — SUGAMMADEX SODIUM 200 MG/2ML IV SOLN
INTRAVENOUS | Status: DC | PRN
  Administered 2021-09-08: 140 mg via INTRAVENOUS

## 2021-09-08 MED ORDER — HYDROMORPHONE HCL 1 MG/ML IJ SOLN: 0.5000 mg | INTRAMUSCULAR | Status: DC | PRN

## 2021-09-08 MED ORDER — CHLORHEXIDINE GLUCONATE CLOTH 2 % EX PADS
6.0000 | MEDICATED_PAD | Freq: Once | CUTANEOUS | Status: DC
Start: 2021-09-08 — End: 2021-09-08

## 2021-09-08 MED ORDER — HEPARIN SODIUM (PORCINE) 5000 UNIT/ML IJ SOLN
5000.0000 [IU] | Freq: Three times a day (TID) | INTRAMUSCULAR | Status: DC
  Administered 2021-09-08 – 2021-09-11 (×9): 5000 [IU] via SUBCUTANEOUS
  Filled 2021-09-08 (×9): qty 1

## 2021-09-08 MED ORDER — METRONIDAZOLE 500 MG PO TABS: 1000.0000 mg | ORAL_TABLET | ORAL | Status: DC

## 2021-09-08 MED ORDER — ACETAMINOPHEN 500 MG PO TABS
1000.0000 mg | ORAL_TABLET | ORAL | Status: AC
  Administered 2021-09-08: 1000 mg via ORAL
  Filled 2021-09-08: qty 2

## 2021-09-08 MED ORDER — LEVOTHYROXINE SODIUM 50 MCG PO TABS
50.0000 ug | ORAL_TABLET | Freq: Every day | ORAL | Status: DC
  Filled 2021-09-08 (×3): qty 1

## 2021-09-08 MED ORDER — SIMETHICONE 80 MG PO CHEW
40.0000 mg | CHEWABLE_TABLET | Freq: Four times a day (QID) | ORAL | Status: DC | PRN
  Administered 2021-09-10: 40 mg via ORAL
  Filled 2021-09-08: qty 1

## 2021-09-08 MED ORDER — LIDOCAINE HCL 2 % IJ SOLN
INTRAMUSCULAR | Status: AC
  Filled 2021-09-08: qty 20

## 2021-09-08 MED ORDER — ONDANSETRON HCL 4 MG/2ML IJ SOLN
INTRAMUSCULAR | Status: DC | PRN
  Administered 2021-09-08: 4 mg via INTRAVENOUS

## 2021-09-08 MED ORDER — ROCURONIUM BROMIDE 10 MG/ML (PF) SYRINGE
PREFILLED_SYRINGE | INTRAVENOUS | Status: DC | PRN
  Administered 2021-09-08: 20 mg via INTRAVENOUS
  Administered 2021-09-08: 50 mg via INTRAVENOUS
  Administered 2021-09-08: 20 mg via INTRAVENOUS
  Administered 2021-09-08: 10 mg via INTRAVENOUS

## 2021-09-08 MED ORDER — SODIUM CHLORIDE 0.9 % IV SOLN
12.5000 mg | Freq: Four times a day (QID) | INTRAVENOUS | Status: DC | PRN
  Administered 2021-09-08: 12.5 mg via INTRAVENOUS
  Filled 2021-09-08: qty 12.5
  Filled 2021-09-08: qty 0.5

## 2021-09-08 MED ORDER — PROPOFOL 10 MG/ML IV BOLUS
INTRAVENOUS | Status: DC | PRN
  Administered 2021-09-08: 150 mg via INTRAVENOUS

## 2021-09-08 MED ORDER — LIDOCAINE 2% (20 MG/ML) 5 ML SYRINGE
INTRAMUSCULAR | Status: DC | PRN
  Administered 2021-09-08: 1 mg/kg/h via INTRAVENOUS

## 2021-09-08 MED ORDER — ENSURE PRE-SURGERY PO LIQD
296.0000 mL | Freq: Once | ORAL | Status: DC
  Filled 2021-09-08: qty 296

## 2021-09-08 MED ORDER — DIPHENHYDRAMINE HCL 50 MG/ML IJ SOLN: 12.5000 mg | Freq: Four times a day (QID) | INTRAMUSCULAR | Status: DC | PRN

## 2021-09-08 MED ORDER — HYDRALAZINE HCL 20 MG/ML IJ SOLN: 10.0000 mg | INTRAMUSCULAR | Status: DC | PRN

## 2021-09-08 MED ORDER — METHYLENE BLUE 0.5 % INJ SOLN
INTRAVENOUS | Status: DC | PRN
  Administered 2021-09-08: 5 mL

## 2021-09-08 MED ORDER — OXYCODONE HCL 5 MG PO TABS: 5.0000 mg | ORAL_TABLET | Freq: Once | ORAL | Status: DC | PRN

## 2021-09-08 MED ORDER — ROCURONIUM BROMIDE 10 MG/ML (PF) SYRINGE
PREFILLED_SYRINGE | INTRAVENOUS | Status: AC
  Filled 2021-09-08: qty 10

## 2021-09-08 MED ORDER — ONDANSETRON HCL 4 MG/2ML IJ SOLN
4.0000 mg | Freq: Four times a day (QID) | INTRAMUSCULAR | Status: DC | PRN
  Administered 2021-09-08: 4 mg via INTRAVENOUS
  Filled 2021-09-08 (×2): qty 2

## 2021-09-08 MED ORDER — MIDAZOLAM HCL 2 MG/2ML IJ SOLN
INTRAMUSCULAR | Status: AC
  Filled 2021-09-08: qty 2

## 2021-09-08 MED ORDER — EPINEPHRINE 0.3 MG/0.3ML IJ SOAJ
0.3000 mg | INTRAMUSCULAR | Status: DC | PRN
  Filled 2021-09-08: qty 0.6

## 2021-09-08 MED ORDER — ACYCLOVIR 200 MG PO CAPS: 800.0000 mg | ORAL_CAPSULE | Freq: Every day | ORAL | Status: DC

## 2021-09-08 MED ORDER — FAMOTIDINE 20 MG PO TABS
20.0000 mg | ORAL_TABLET | Freq: Two times a day (BID) | ORAL | Status: DC | PRN
  Filled 2021-09-08: qty 1

## 2021-09-08 MED ORDER — TRAMADOL HCL 50 MG PO TABS: 50.0000 mg | ORAL_TABLET | Freq: Four times a day (QID) | ORAL | 0 refills | Status: DC | PRN

## 2021-09-08 MED ORDER — FENTANYL CITRATE (PF) 250 MCG/5ML IJ SOLN
INTRAMUSCULAR | Status: AC
  Filled 2021-09-08: qty 5

## 2021-09-08 MED ORDER — EPHEDRINE 5 MG/ML INJ
INTRAVENOUS | Status: AC
  Filled 2021-09-08: qty 5

## 2021-09-08 MED ORDER — IBUPROFEN 400 MG PO TABS
600.0000 mg | ORAL_TABLET | Freq: Four times a day (QID) | ORAL | Status: DC | PRN
  Administered 2021-09-08 – 2021-09-09 (×3): 600 mg via ORAL
  Filled 2021-09-08 (×3): qty 1

## 2021-09-08 MED ORDER — ORAL CARE MOUTH RINSE: 15.0000 mL | Freq: Once | OROMUCOSAL | Status: AC

## 2021-09-08 MED ORDER — BUPIVACAINE-EPINEPHRINE (PF) 0.25% -1:200000 IJ SOLN
INTRAMUSCULAR | Status: DC | PRN
  Administered 2021-09-08: 30 mL via PERINEURAL

## 2021-09-08 MED ORDER — HYDROMORPHONE HCL 1 MG/ML IJ SOLN
0.2500 mg | INTRAMUSCULAR | Status: DC | PRN
  Administered 2021-09-08 (×4): 0.5 mg via INTRAVENOUS

## 2021-09-08 MED ORDER — PHENYLEPHRINE HCL-NACL 20-0.9 MG/250ML-% IV SOLN
INTRAVENOUS | Status: DC | PRN
  Administered 2021-09-08: 35 ug/min via INTRAVENOUS

## 2021-09-08 MED ORDER — CHLORHEXIDINE GLUCONATE CLOTH 2 % EX PADS: 6.0000 | MEDICATED_PAD | Freq: Once | CUTANEOUS | Status: DC

## 2021-09-08 MED ORDER — INSULIN ASPART 100 UNIT/ML IJ SOLN: 0.0000 [IU] | Freq: Every day | INTRAMUSCULAR | Status: DC

## 2021-09-08 MED ORDER — EPHEDRINE SULFATE-NACL 50-0.9 MG/10ML-% IV SOSY
PREFILLED_SYRINGE | INTRAVENOUS | Status: DC | PRN
  Administered 2021-09-08 (×2): 5 mg via INTRAVENOUS

## 2021-09-08 MED ORDER — BISACODYL 5 MG PO TBEC: 20.0000 mg | DELAYED_RELEASE_TABLET | Freq: Once | ORAL | Status: DC

## 2021-09-08 MED ORDER — ACETAMINOPHEN 500 MG PO TABS: 1000.0000 mg | ORAL_TABLET | Freq: Once | ORAL | Status: DC

## 2021-09-08 MED ORDER — DEXAMETHASONE SODIUM PHOSPHATE 10 MG/ML IJ SOLN
INTRAMUSCULAR | Status: DC | PRN
  Administered 2021-09-08: 10 mg via INTRAVENOUS

## 2021-09-08 SURGICAL SUPPLY — 128 items
ADH SKN CLS APL DERMABOND .7 (GAUZE/BANDAGES/DRESSINGS) ×2
APL PRP STRL LF DISP 70% ISPRP (MISCELLANEOUS) ×2
APPLIER CLIP 5 13 M/L LIGAMAX5 (MISCELLANEOUS)
APPLIER CLIP ROT 10 11.4 M/L (STAPLE)
APR CLP MED LRG 11.4X10 (STAPLE)
APR CLP MED LRG 5 ANG JAW (MISCELLANEOUS)
BAG COUNTER SPONGE SURGICOUNT (BAG) IMPLANT
BAG SPNG CNTER NS LX DISP (BAG)
BAG URO CATCHER STRL LF (MISCELLANEOUS) ×3 IMPLANT
BLADE EXTENDED COATED 6.5IN (ELECTRODE) ×3 IMPLANT
CANNULA REDUC XI 12-8 STAPL (CANNULA) ×3
CANNULA REDUCER 12-8 DVNC XI (CANNULA) ×2 IMPLANT
CATH URETL OPEN 5X70 (CATHETERS) IMPLANT
CELLS DAT CNTRL 66122 CELL SVR (MISCELLANEOUS) IMPLANT
CHLORAPREP W/TINT 26 (MISCELLANEOUS) ×3 IMPLANT
CLIP APPLIE 5 13 M/L LIGAMAX5 (MISCELLANEOUS) IMPLANT
CLIP APPLIE ROT 10 11.4 M/L (STAPLE) IMPLANT
CLIP LIGATING HEM O LOK PURPLE (MISCELLANEOUS) IMPLANT
CLIP LIGATING HEMO O LOK GREEN (MISCELLANEOUS) IMPLANT
CLOTH BEACON ORANGE TIMEOUT ST (SAFETY) ×3 IMPLANT
COVER SURGICAL LIGHT HANDLE (MISCELLANEOUS) ×6 IMPLANT
COVER TIP SHEARS 8 DVNC (MISCELLANEOUS) ×2 IMPLANT
COVER TIP SHEARS 8MM DA VINCI (MISCELLANEOUS) ×3
DECANTER SPIKE VIAL GLASS SM (MISCELLANEOUS) ×3 IMPLANT
DEFOGGER SCOPE WARMER CLEARIFY (MISCELLANEOUS) ×3 IMPLANT
DERMABOND ADVANCED (GAUZE/BANDAGES/DRESSINGS) ×1
DERMABOND ADVANCED .7 DNX12 (GAUZE/BANDAGES/DRESSINGS) IMPLANT
DEVICE TROCAR PUNCTURE CLOSURE (ENDOMECHANICALS) IMPLANT
DRAIN CHANNEL 19F RND (DRAIN) ×3 IMPLANT
DRAPE ARM DVNC X/XI (DISPOSABLE) ×8 IMPLANT
DRAPE COLUMN DVNC XI (DISPOSABLE) ×2 IMPLANT
DRAPE DA VINCI XI ARM (DISPOSABLE) ×12
DRAPE DA VINCI XI COLUMN (DISPOSABLE) ×3
DRAPE SURG IRRIG POUCH 19X23 (DRAPES) ×3 IMPLANT
DRSG OPSITE POSTOP 4X10 (GAUZE/BANDAGES/DRESSINGS) IMPLANT
DRSG OPSITE POSTOP 4X6 (GAUZE/BANDAGES/DRESSINGS) IMPLANT
DRSG OPSITE POSTOP 4X8 (GAUZE/BANDAGES/DRESSINGS) IMPLANT
DRSG TEGADERM 2-3/8X2-3/4 SM (GAUZE/BANDAGES/DRESSINGS) ×15 IMPLANT
DRSG TEGADERM 4X4.75 (GAUZE/BANDAGES/DRESSINGS) ×3 IMPLANT
ELECT REM PT RETURN 15FT ADLT (MISCELLANEOUS) ×3 IMPLANT
ENDOLOOP SUT PDS II  0 18 (SUTURE)
ENDOLOOP SUT PDS II 0 18 (SUTURE) IMPLANT
EVACUATOR SILICONE 100CC (DRAIN) ×3 IMPLANT
GAUZE SPONGE 2X2 8PLY STRL LF (GAUZE/BANDAGES/DRESSINGS) ×2 IMPLANT
GAUZE SPONGE 4X4 12PLY STRL (GAUZE/BANDAGES/DRESSINGS) IMPLANT
GLOVE SURG ENC MOIS LTX SZ7.5 (GLOVE) ×9 IMPLANT
GLOVE SURG POLYISO LF SZ8 (GLOVE) IMPLANT
GLOVE SURG UNDER LTX SZ8 (GLOVE) ×9 IMPLANT
GOWN STRL REUS W/TWL XL LVL3 (GOWN DISPOSABLE) ×18 IMPLANT
GRASPER SUT TROCAR 14GX15 (MISCELLANEOUS) IMPLANT
GUIDEWIRE STR DUAL SENSOR (WIRE) ×3 IMPLANT
HOLDER FOLEY CATH W/STRAP (MISCELLANEOUS) ×3 IMPLANT
IRRIG SUCT STRYKERFLOW 2 WTIP (MISCELLANEOUS) ×3
IRRIGATION SUCT STRKRFLW 2 WTP (MISCELLANEOUS) ×2 IMPLANT
KIT PROCEDURE DA VINCI SI (MISCELLANEOUS)
KIT PROCEDURE DVNC SI (MISCELLANEOUS) IMPLANT
KIT TURNOVER KIT A (KITS) IMPLANT
MANIFOLD NEPTUNE II (INSTRUMENTS) ×3 IMPLANT
NDL INSUFFLATION 14GA 120MM (NEEDLE) ×2 IMPLANT
NEEDLE INSUFFLATION 14GA 120MM (NEEDLE) ×3 IMPLANT
PACK CARDIOVASCULAR III (CUSTOM PROCEDURE TRAY) ×3 IMPLANT
PACK COLON (CUSTOM PROCEDURE TRAY) ×3 IMPLANT
PACK CYSTO (CUSTOM PROCEDURE TRAY) ×3 IMPLANT
PAD POSITIONING PINK XL (MISCELLANEOUS) ×3 IMPLANT
PENCIL SMOKE EVACUATOR (MISCELLANEOUS) IMPLANT
PROTECTOR NERVE ULNAR (MISCELLANEOUS) ×6 IMPLANT
RELOAD STAPLE 45 3.5 BLU DVNC (STAPLE) IMPLANT
RELOAD STAPLE 45 4.3 GRN DVNC (STAPLE) IMPLANT
RELOAD STAPLE 60 3.5 BLU DVNC (STAPLE) IMPLANT
RELOAD STAPLE 60 4.3 GRN DVNC (STAPLE) IMPLANT
RELOAD STAPLER 3.5X45 BLU DVNC (STAPLE) IMPLANT
RELOAD STAPLER 3.5X60 BLU DVNC (STAPLE) IMPLANT
RELOAD STAPLER 4.3X45 GRN DVNC (STAPLE) IMPLANT
RELOAD STAPLER 4.3X60 GRN DVNC (STAPLE) ×2 IMPLANT
RETRACTOR WND ALEXIS 18 MED (MISCELLANEOUS) IMPLANT
RTRCTR WOUND ALEXIS 18CM MED (MISCELLANEOUS)
SCISSORS LAP 5X35 DISP (ENDOMECHANICALS) IMPLANT
SEAL CANN UNIV 5-8 DVNC XI (MISCELLANEOUS) ×8 IMPLANT
SEAL XI 5MM-8MM UNIVERSAL (MISCELLANEOUS) ×12
SEALER VESSEL DA VINCI XI (MISCELLANEOUS) ×3
SEALER VESSEL EXT DVNC XI (MISCELLANEOUS) ×2 IMPLANT
SLEEVE ADV FIXATION 5X100MM (TROCAR) IMPLANT
SOLUTION ELECTROLUBE (MISCELLANEOUS) ×3 IMPLANT
SPONGE GAUZE 2X2 STER 10/PKG (GAUZE/BANDAGES/DRESSINGS) ×1
STAPLER CANNULA SEAL DVNC XI (STAPLE) ×2 IMPLANT
STAPLER CANNULA SEAL XI (STAPLE) ×3
STAPLER ECHELON POWER CIR 29 (STAPLE) IMPLANT
STAPLER ECHELON POWER CIR 31 (STAPLE) IMPLANT
STAPLER RELOAD 3.5X45 BLU DVNC (STAPLE)
STAPLER RELOAD 3.5X45 BLUE (STAPLE)
STAPLER RELOAD 3.5X60 BLU DVNC (STAPLE)
STAPLER RELOAD 3.5X60 BLUE (STAPLE)
STAPLER RELOAD 4.3X45 GREEN (STAPLE)
STAPLER RELOAD 4.3X45 GRN DVNC (STAPLE)
STAPLER RELOAD 4.3X60 GREEN (STAPLE) ×3
STAPLER RELOAD 4.3X60 GRN DVNC (STAPLE) ×2
STOPCOCK 4 WAY LG BORE MALE ST (IV SETS) ×6 IMPLANT
SURGILUBE 2OZ TUBE FLIPTOP (MISCELLANEOUS) ×3 IMPLANT
SUT MNCRL AB 4-0 PS2 18 (SUTURE) ×3 IMPLANT
SUT PDS AB 1 CT1 27 (SUTURE) IMPLANT
SUT PDS AB 1 TP1 96 (SUTURE) IMPLANT
SUT PROLENE 0 CT 2 (SUTURE) IMPLANT
SUT PROLENE 2 0 KS (SUTURE) ×3 IMPLANT
SUT PROLENE 2 0 SH DA (SUTURE) IMPLANT
SUT SILK 2 0 (SUTURE)
SUT SILK 2 0 SH CR/8 (SUTURE) IMPLANT
SUT SILK 2-0 18XBRD TIE 12 (SUTURE) IMPLANT
SUT SILK 3 0 (SUTURE) ×3
SUT SILK 3 0 SH CR/8 (SUTURE) ×3 IMPLANT
SUT SILK 3-0 18XBRD TIE 12 (SUTURE) ×2 IMPLANT
SUT V-LOC BARB 180 2/0GR6 GS22 (SUTURE)
SUT VIC AB 3-0 SH 18 (SUTURE) IMPLANT
SUT VIC AB 3-0 SH 27 (SUTURE)
SUT VIC AB 3-0 SH 27XBRD (SUTURE) IMPLANT
SUT VICRYL 0 UR6 27IN ABS (SUTURE) ×3 IMPLANT
SUTURE V-LC BRB 180 2/0GR6GS22 (SUTURE) IMPLANT
SYR 10ML LL (SYRINGE) ×3 IMPLANT
SYS LAPSCP GELPORT 120MM (MISCELLANEOUS)
SYS WOUND ALEXIS 18CM MED (MISCELLANEOUS) ×3
SYSTEM LAPSCP GELPORT 120MM (MISCELLANEOUS) IMPLANT
SYSTEM WOUND ALEXIS 18CM MED (MISCELLANEOUS) ×2 IMPLANT
TAPE UMBILICAL 1/8 X36 TWILL (MISCELLANEOUS) ×3 IMPLANT
TOWEL OR NON WOVEN STRL DISP B (DISPOSABLE) ×3 IMPLANT
TRAY FOLEY MTR SLVR 16FR STAT (SET/KITS/TRAYS/PACK) ×3 IMPLANT
TROCAR ADV FIXATION 5X100MM (TROCAR) ×3 IMPLANT
TUBING CONNECTING 10 (TUBING) ×9 IMPLANT
TUBING INSUFFLATION 10FT LAP (TUBING) ×3 IMPLANT
TUBING UROLOGY SET (TUBING) IMPLANT

## 2021-09-08 NOTE — Op Note (Signed)
Procedure: 1.  Cystoscopy with bilateral ureteral catheter insertion. 2.  Application of fluoroscopy.  Preop: Colovesical fistula.  Postop diagnosis: Same.  Surgeon: Dr. Irine Seal.  Anesthesia: General.  Specimen: None.  Drain: Right 6 [place] and left a 5 [place] ureteral catheters and 14 French Foley.  EBL: None.  Complications: None.  Indications: The patient is a 71 year old female with a colovesical fistula who is to undergo colonic resection robotically and placement of ureteral catheters was requested.  Procedure: She was given Cefotan.  She was taken operating room where general anesthetic was induced.  She was placed in lithotomy position and fitted with PAS hose.  She was prepped with Hibiclens and draped in usual sterile fashion.  Cystoscopy was performed using a 21 [place] scope and 30 degree lens.  Examination revealed a normal urethra.  The bladder wall had mild inflammatory erythema and there was some debris in the bladder consistent with her diagnosis.  On the left dome there was a herald patch with edema at the site of her fistula.  The ureteral orifices were unremarkable.  The right ureteral orifice was cannulated with a 6 French ureteral catheter.  A sensor wire was required to negotiate angulation at the iliac level but the catheter was otherwise easily passed to the kidney.  The left ureteral orifice was cannulated with a 5 [place] open-ended catheter which easily passed to the kidney.  The cystoscope was removed and a 58 [place] Foley was placed.  Balloon was filled with 10 mL of sterile fluid.  The ureteral catheters were secured to the Foley with a 2-0 silk tie and ureteral catheters and Foley replaced to a [place] device and then straight drainage.  There were no complications during my portion of the procedure.  Dr. [name] will perform the colonic surgery.

## 2021-09-08 NOTE — Anesthesia Procedure Notes (Deleted)
Spinal  Patient location during procedure: OR Reason for block: surgical anesthesia Preanesthetic Checklist Completed: patient identified, IV checked, site marked, risks and benefits discussed, surgical consent, monitors and equipment checked, pre-op evaluation and timeout performed Spinal Block Patient position: sitting Prep: DuraPrep Patient monitoring: heart rate, cardiac monitor, continuous pulse ox and blood pressure Approach: midline Location: L3-4 Injection technique: single-shot Needle Needle type: Sprotte  Needle gauge: 24 G Needle length: 9 cm Assessment Sensory level: T4 Events: CSF return

## 2021-09-08 NOTE — Anesthesia Postprocedure Evaluation (Signed)
Anesthesia Post Note  Patient: Jamie [name]  Procedure(s) Performed: XI ROBOT ASSISTED SIGMOIDECTOMY VS LOW ANTERIOR RESECTION, TAKEDOWN OF COLOVESICAL FISTULA FLEXIBLE SIGMOIDOSCOPY LYSIS OF ADHESION CYSTOSCOPY WITH STENT PLACEMENT (Bilateral)     Patient location during evaluation: PACU Anesthesia Type: General Level of consciousness: awake and alert, patient cooperative and oriented Pain management: pain level controlled Vital Signs Assessment: post-procedure vital signs reviewed and stable Respiratory status: spontaneous breathing, nonlabored ventilation, respiratory function stable and patient connected to nasal cannula oxygen Cardiovascular status: blood pressure returned to baseline and stable Postop Assessment: no apparent nausea or vomiting Anesthetic complications: no   No notable events documented.  Last Vitals:  Vitals:   09/08/21 1200 09/08/21 1215  BP: 124/77 101/60  Pulse: 91 81  Resp: 15 (!) 9  Temp:  (!) 36.4 C  SpO2: 100% 100%    Last Pain:  Vitals:   09/08/21 1215  TempSrc:   PainSc: 4                  Alessandro Griep,E. Damione Robideau

## 2021-09-08 NOTE — H&P (Signed)
CC: Here today for surgery  HPI: Jamie [name] is an 71 y.o. female with history of HTN, GERD, hypothyroidism, DM, whom is seen in the office today as a referral by Dr. Louis Meckel for evaluation of possible colovesical fistula.   CT A/P 02/23/21 - diverticulosis  CT A/P 06/15/2015 - sigmoid diverticulitis complicated by microperforation and a small 2.7 cm abscess abutting the posterior margin of the proximal sigmoid colon.  Colonoscopy - 03/2017 - Dr. Ardis Hughs - diverticulosis and some tortuosity of the sigmoid. Exam was otherwise normal.  Cysto with Dr. Louis Meckel 07/04/21 -within the bladder there is erythematous heaped mucosa on the posterior wall between the trigone. No trabeculation. No tumors. No stones. 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  She reports that she has had multiple bouts of prior left and right lower quadrant abdominal pain attacks. It generally involves both lower quadrants. It last for days on end and is generally severe cramps. Whenever this occurs, she will reach out to her primary care and take oral antibiotics and this will gradually improve. She estimates she is had at least 7-8 prior attacks of this. Last was within the past year. Currently, denies abdominal pain, fever or chills. She does report that she will pass air through her urethra when she urinates. She has also developed some degree of urinary urgency and frequency. She denies any recurring urinary tract infections per se.  INTERVAL HX She denies any changes in her health or health history since we met in the office aside from 1 single potential flair - resolved with antibiotics.  PMH: HTN, GERD, hypothyroidism, DM  PSH: Prior hysterectomy via Pfannenstiel. She denies any prior abdominal surgical history aside from this.  FHx: Denies any known family history of colorectal, breast, endometrial or ovarian cancer  Social Hx: Denies use of tobacco/EtOH/illicit drug. She is retired. 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 She is here today with her sister.  Past Medical History:  Diagnosis Date   Allergy    Anemia    Anxiety    Cataract    bilateral   Depression    Diverticulitis    Diverticulosis    Dyspnea    GERD (gastroesophageal reflux disease)    Hives of unknown origin 10/12/2019   Hyperlipidemia    Hypothyroidism    IBS (irritable bowel syndrome)    Lung mass    Pneumonia    Pre-diabetes    Syncope 03/08/2015   Thyroid disease     Past Surgical History:  Procedure Laterality Date   ABDOMINAL HYSTERECTOMY     COLONOSCOPY     HEMORRHOID SURGERY     TUBAL LIGATION     TUMOR REMOVAL     Left thigh     Family History  Problem Relation Age of Onset   Diabetes Mother    Liver cancer Mother        renal cell    Heart disease Father        first MI in 75s   Cervical cancer Sister    Allergic rhinitis Sister    Asthma Sister    Heart disease Brother        first MI in 78s   Alcohol abuse Sister    Fibromyalgia Sister    Pancreatic cancer Sister 12   Arthritis Sister        osteoarthritis   Emphysema Sister    Irritable bowel syndrome Sister    Emphysema Sister    Leukemia Other  grandson   Urticaria Daughter    Allergic rhinitis Daughter    Stomach cancer Neg Hx    Colon cancer Neg Hx    Angioedema Neg Hx    Eczema Neg Hx    Immunodeficiency Neg Hx    Colon polyps Neg Hx    Esophageal cancer Neg Hx    Rectal cancer Neg Hx     Social:  reports that she quit smoking about 36 years ago. Her smoking use included cigarettes. She has a 16.00 pack-year smoking history. She has never used smokeless tobacco. She reports current alcohol use. She reports that she does not use drugs.  Allergies:  Allergies  Allergen Reactions   Dairycare [Lactase-Lactobacillus] Anaphylaxis   Iodine Rash   Ciprofloxacin     Facial numbness   Iodinated Contrast Media Other (See Comments)    Face and chest felt "hot", throat felt tight.     Peanut Butter Flavor Nausea And Vomiting    N/V   Shellfish Allergy Swelling    Medications: I have reviewed the patient's current medications.  Results for orders placed or performed during the hospital encounter of 09/08/21 (from the past 48 hour(s))  Glucose, capillary     Status: Abnormal   Collection Time: 09/08/21  5:32 AM  Result Value Ref Range   Glucose-Capillary 105 (H) 70 - 99 mg/dL    Comment: Glucose reference range applies only to samples taken after fasting for at least 8 hours.    No results found.  ROS - all of the below systems have been reviewed with the patient and positives are indicated with bold text General: chills, fever or night sweats Eyes: blurry vision or double vision ENT: epistaxis or sore throat Allergy/Immunology: itchy/watery eyes or nasal congestion Hematologic/Lymphatic: bleeding problems, blood clots or swollen lymph nodes Endocrine: temperature intolerance or unexpected weight changes Breast: new or changing breast lumps or nipple discharge Resp: cough, shortness of breath, or wheezing CV: chest pain or dyspnea on exertion GI: as per HPI GU: dysuria, trouble voiding, or hematuria MSK: joint pain or joint stiffness Neuro: TIA or stroke symptoms Derm: pruritus and skin lesion changes Psych: anxiety and depression  PE Blood pressure 134/79, pulse 90, temperature 98 F (36.7 C), temperature source Oral, resp. rate 15, height '5\' 5"'  (1.651 m), weight 61.7 kg, SpO2 99 %. Constitutional: NAD; conversant Eyes: Moist conjunctiva; no lid lag; anicteric Lungs: Normal respiratory effort; no tactile fremitus CV: RRR; no palpable thrills; no pitting edema GI: Abd soft, NT/ND; no palpable hepatosplenomegaly MSK: Normal range of motion of extremities Psychiatric: Appropriate affect; alert and oriented x3 Lymphatic: No palpable cervical or axillary lymphadenopathy  Results for orders placed or performed during the hospital encounter of 09/08/21  (from the past 48 hour(s))  Glucose, capillary     Status: Abnormal   Collection Time: 09/08/21  5:32 AM  Result Value Ref Range   Glucose-Capillary 105 (H) 70 - 99 mg/dL    Comment: Glucose reference range applies only to samples taken after fasting for at least 8 hours.    No results found.  A/P: Jamie [name] is an 71 y.o. female with with hx of HTN, GERD, hypothyroidism, DM, here for evaluation of history of multiple prior attacks of diverticulitis including those captured by CT scan. 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  Cystoscopy demonstrated heaped up bullous mucosa between the trigone's felt to be consistent with a vesicointestinal fistula.  -The anatomy and physiology of the GI tract  was reviewed with the patient and her sister today. The pathophysiology of diverticulitis and colovesical fistulas was discussed as well with associated pictures. -We have discussed various different treatment options going forward including surgery (the most definitive) to address this -robotic assisted sigmoidectomy versus low anterior resection, takedown of colovesical fistula, flexible sigmoidoscopy. Possible bladder repair. Urology for cystoscopy/ureteral stents. She reports she has a severe shellfish allergy and is also allergic to contrast.  -We have also discussed observation as a option but she remains interested in pursuing surgery due to her symptoms.  -The planned procedure, material risks (including, but not limited to, pain, bleeding, infection, scarring, need for blood transfusion, damage to surrounding structures- blood vessels/nerves/viscus/organs, damage to ureter, urine leak, leak from anastomosis, need for additional procedures, scenarios where a stoma may be necessary and where it may be permanent, worsening of pre-existing medical conditions, hernia, recurrence, pneumonia, heart attack, stroke, death) benefits and alternatives to surgery were discussed at  length. The patient's questions were answered to her satisfaction, she voiced understanding and elected to proceed with surgery. Additionally, we discussed typical postoperative expectations and the recovery process.   Nadeen Landau, MD [facility], [facility]

## 2021-09-08 NOTE — H&P (Signed)
I was asked to place stents prior to robotic colon resection for a colovesical fistula.  I have reviewed her imaging and notes.  I reviewed the details of the procedure with her and discussed the potential risks of bleeding, infection, ureteral injury and failure to get the stents up.

## 2021-09-08 NOTE — Op Note (Signed)
PATIENT: Jamie [name]  71 y.o. female  Patient Care Team: Lavada Mesi as PCP - General (Family Medicine)  PREOP DIAGNOSIS: COLOVESICAL FISTULA  POSTOP DIAGNOSIS: COLOVESICAL FISTULA  PROCEDURE:  Robotic assisted low anterior resection with double stapled colorectal anastomosis Takedown of colovesical fistula Robotic lysis of adhesions x 90 minutes Flexible sigmoidoscopy Bilateral transversus abdominus plane (TAP) blocks  SURGEON: Sharon Mt. Redith Drach, MD  ASSISTANT: Leighton Ruff, MD  ANESTHESIA: General endotracheal  EBL: 100 mL Total I/O In: 1100 [I.V.:1000; IV Piggyback:100] Out: 100 [Urine:75; Blood:25]  DRAINS: None  SPECIMEN: Rectosigmoid colon - open end proximal  COUNTS: Sponge, needle and instrument counts were reported correct x2  FINDINGS: Relatively dense adhesions in much of the lower abdomen and pelvis consisting of omentum, small bowel, sigmoid colon.  Much of these adhesions appear likely secondary to prior hysterectomy.  Colovesical fistula suspected at the dome of the bladder.  After takedown of the fistula, backfilling of the bladder until it was tense with approximately 300 cc of dilute methylene blue, there is no evident extravasation.  Disease sigmoid extended down to the proximalmost rectum where there was some thickening likely related to prior inflammation from the neighboring sigmoid.  Therefore, a low anterior resection was necessary.  A well perfused, tension free, hemostatic, air tight 29 mm EEA colorectal anastomosis fashioned 13 cm from the anal verge by flexible sigmoidoscopy.   NARRATIVE: Informed consent was verified. The patient was taken to the operating room, placed supine on the operating table and SCD's were applied. General endotracheal anesthesia was induced without difficulty. She was then positioned in the lithotomy position with Allen stirrups.  Pressure points were evaluated and padded.  A foley catheter was then placed  by nursing under sterile conditions. Hair on the abdomen was clipped.  She was secured to the operating table. Dr. Jeffie Pollock from Urology came in for cystoscopy/ureteral stent placement.  Please refer to his notes for details regarding his portion of the procedure. The abdomen was then prepped and draped in the standard sterile fashion. Surgical timeout was called indicating the correct patient, procedure, positioning and need for preoperative antibiotics.   An OG tube was placed by anesthesia and confirmed to be to suction.  At Palmer's point, a stab incision was created and the Veress needle was introduced into the peritoneal cavity on the first attempt.  Intraperitoneal location was confirmed by the aspiration and saline drop test.  Pneumoperitoneum was established to a maximum pressure of 15 mmHg using CO2.  Following this, the abdomen was marked for planned trocar sites.  Just to the right and cephalad to the umbilicus, an 8 mm incision was created and an 8 mm blunt tipped robotic trocar was cautiously placed into the peritoneal cavity.  The laparoscope was inserted and demonstrated no evidence of trocar site nor Veress needle site complications.  The Veress needle was removed.  Bilateral transversus abdominis plane blocks were then created using a dilute mixture of Exparel with Marcaine.  3 additional 8 mm robotic trochars were placed under direct visualization roughly in a line extending from the right ASIS towards the left upper quadrant. The bladder was inspected and noted to be at/below the pubic symphysis.  Staying 3 fingerbreadths above the pubic symphysis, an incision was created and the 12 mm robotic trocar inserted directed cephalad into the peritoneal cavity under direct visualization.  An additional 5 mm assist port was placed in the right lateral abdomen under direct visualization.  The abdomen was surveyed  and there was relatively dense adhesions in the lower abdomen and pelvis consisting of  omentum, small bowel, and colon. She was positioned in Trendelenburg with the left side tilted slightly up.  Small bowel was carefully retracted out of the pelvis.  The robot was then docked and I went to the console.  We began with adhesiolysis. Adhesions consisting of omentum were taken down first from the low midline.  This was done using the vessel sealer.  There were also adhesions of the omentum across the left and right lower quadrants that were taken down.  There was omental adhesions to the colon were taken down.  Ultimately, were able to gain access to the pelvis.  She has a chronic fibrotic type process consistent with prior diverticulitis involving the dome of the bladder.  There are small bowel adhesions extending down through her right lower quadrant along the retroperitoneum and into the deep pelvis.  These were all taken down sharply using scissors.  We were ultimately able to free the small bowel and mesenteric adhesions from the pelvis facilitating access.  The sigmoid colon was identified.  Adhesions of the sigmoid into the pelvis were also lysed sharply.  The small bowel was then inspected and noted to be free of any apparent injuries or serosal tears.  Attachments of the sigmoid colon were taken down from the intersigmoid fossa.  We began by mobilizing the distal descending and ascending colon all the way up to the level of splenic flexure by incising the Kaoru Rezendes line of Toldt.  The associated mesocolon was reflected anteriorly.  We then approached the relatively dense inflammatory process entering to the pelvis.  We worked to dissect circumferentially around this, surrounding the process.  Prior to going down along the left retroperitoneum, we proceeded with a medial to lateral approach to identify the ureter.  Much of the sigmoid then had to be mobilized out of the pelvis in order to facilitate access to the rectosigmoid colon.  The rectosigmoid colon was grasped and elevated anteriorly.   The peritoneum overlying the presacral space was carefully incised.  The TME plane was readily gained working in a plane between the fascia propria of the rectum and the presacral fascia.  Hypogastric nerves were seen going along the the presacral fascia and were protected free of injury.  Working more proximally, the mesorectum and sigmoid mesentery were carefully mobilized off of the peritoneum.  The left ureter was identified and protected free of injury.  The left gonadal vessels were identified and protected.  These were both swept "down."  The superior hemorrhoidal and IMA pedicles were identified. Further mesocolon was mobilized proximally staying in this plane between the retroperitoneum proper and the mesocolon. Attention was then turned to the lateral portion of dissection.  The sigmoid colon was then retracted to the right.  The sigmoid colon was fully mobilized.  The colovesical fistula was then taken down from the bladder staying close to the colon and essentially dissecting this free along the actual serosa of the colon.  The left ureter again was confirmed to be well away from the vasculature which had been dissected medially.  The rectosigmoid colon was elevated anteriorly. The left ureter was re-identified. The IMA was clear of this. The IMA was then divided with the vessel sealer. The stump was inspected and noted to be completely hemostatic with a good seal.  The mesentery was divided out to the point of planned proximal division.  Working more distally, the rectum was identified  where the tinea had splayed and there were loss of appendices epiploica.  This also corresponded to a location overlying the sacral promontory.  Anatomically, this clearly represents the proximal rectum and was just below the thickened portion of the proximalmost rectum where the sigmoid had been somewhat adherent..  The mesentery out to this level was then cleared using the vessel sealer. The distal point of  transection on the proximal rectum was identified.   A 60 mm green load robotic stapler was then placed through the 12 mm port and introduced into the peritoneal cavity.  The rectum was divided with 1 firing of the stapler.  The stump was intact and healthy in appearance.   The bladder was then backfilled with 300 cc of dilute methylene blue at which point it was relatively tensely distended.  Gentle pressure was applied to the bladder.  Where the suspected colovesical fistula was located, there is no extravasation of dilute dye.  The bladder is then placed back to gravity drainage.  The rectal stump was inspected and hemostatic and well perfused in appearance.  There is a visible pulse in the mesentery going out to the proximal point of planned transection on the descending/sigmoid colon junction.  This reaches into the pelvis without any tension and remains in that location.   Attention was turned to the extracorporeal portion of the procedure.  The robot was undocked.  I scrubbed back in.  Using the 12 mm trocar site, a Pfannenstiel incision was created and incorporated the fascial opening through the 12 mm port site.  The rectus fascia was incised and then elevated.  The rectus muscle was mobilized free of the overlying fascia.  The peritoneum was incised in the midline well above the location of the bladder.  An Falman wound protector was placed.  Towels were placed around the field.  The divided colon was passed through the wound protector.  The point of proximal division was identified and was again on a healthy segment of supple colon with a palpable pulse in the mesentery. This was pink in color.  A pursestring device was applied.  A 2-0 Prolene on a Keith needle was passed.  The colon was divided and passed off with the open end being proximal.  EEA sizers were then introduced and a 29 mm EEA selected.  "Belt loops" consisting of 3-0 silk were placed around the pursestring suture line.  The anvil was  placed and the pursestring tied.  A small amount of fat was cleared from the planned anastomosis and no diverticula were apparent within this.  This was placed back into the abdomen and a cap placed over the wound protector port site.  Pneumoperitoneum was reestablished.  I then went below to pass the stapler.  My partner remained above.  EEA sizers were cautiously introduced via the anus and advanced under direct visualization.  The stapler was passed and the spike deployed just anterior to the staple line.  The components were then mated.  Orientation was confirmed such that there is no twisting of the colon nor small bowel underneath the mesenteric defect. Care was taken to ensure no other structures were incorporated within this either.  The stapler was then closed, held, and fired. This was then removed. The donuts were inspected and noted to be complete.  The colon proximal to the anastomosis was then gently occluded. The pelvis was filled with sterile irrigation. Under direct visualization, I passed a flexible sigmoidoscope.  The anastomosis was under water.  With good distention of the anastomosis there was no air leak. The anastomosis pink in appearance.  This is located at 13 cm from the anal verge by flexible sigmoidoscopy.  It is hemostatic.  Additionally, looking from above, there is no tension on the colon or mesentery.  Sigmoidoscope was withdrawn.  Irrigation was evacuated from the pelvis.  The abdomen and pelvis are surveyed and noted to be completely hemostatic without any apparent injury.  Under direct visualization, all trochars are removed.  The Inman wound protector was removed.  Gowns/gloves are changed and a fresh set of clean instruments utilized. Additional sterile drapes were placed around the field.   The Pfannenstiel peritoneum was closed with a running 2-0 Vicryl suture.  The rectus fascia was then closed using 2 running #1 PDS sutures.  The fascia was then palpated and noted to be  completely closed.  Additional anesthetic was infiltrated at the Pfannenstiel site.  Sponge, needle, and instrument counts were reported correct x2. 4-0 Monocryl subcuticular suture was used to close the skin of all incision sites.  Dermabond was placed over all incisions.     She was then taken out of lithotomy, awakened from anesthesia, extubated, and transferred to a stretcher for transport to PACU in satisfactory condition having tolerated the procedure well.

## 2021-09-08 NOTE — Anesthesia Procedure Notes (Signed)
Procedure Name: Intubation Date/Time: 09/08/2021 7:41 AM Performed by: Maxwell Caul, CRNA Pre-anesthesia Checklist: Patient identified, Emergency Drugs available, Suction available and Patient being monitored Patient Re-evaluated:Patient Re-evaluated prior to induction Oxygen Delivery Method: Circle system utilized Preoxygenation: Pre-oxygenation with 100% oxygen Induction Type: IV induction Ventilation: Mask ventilation without difficulty Laryngoscope Size: Mac and 4 Grade View: Grade I Tube type: Oral Tube size: 7.0 mm Number of attempts: 1 Airway Equipment and Method: Stylet Placement Confirmation: ETT inserted through vocal cords under direct vision, positive ETCO2 and breath sounds checked- equal and bilateral Secured at: 21 cm Tube secured with: Tape Dental Injury: Teeth and Oropharynx as per pre-operative assessment

## 2021-09-08 NOTE — Discharge Instructions (Signed)
POST OP INSTRUCTIONS AFTER COLON SURGERY  DIET: Be sure to include lots of fluids daily to stay hydrated - 64oz of water per day (8, 8 oz glasses).  Avoid fast food or heavy meals for the first couple of weeks as your are more likely to get nauseated. Avoid raw/uncooked fruits or vegetables for the first 4 weeks (its ok to have these if they are blended into smoothie form). If you have fruits/vegetables, make sure they are cooked until soft enough to mash on the roof of your mouth and chew your food well. Otherwise, diet as tolerated.  Take your usually prescribed home medications unless otherwise directed.  PAIN CONTROL: Pain is best controlled by a usual combination of three different methods TOGETHER: Ice/Heat Over the counter pain medication Prescription pain medication Most patients will experience some swelling and bruising around the surgical site.  Ice packs or heating pads (30-60 minutes up to 6 times a day) will help. Some people prefer to use ice alone, heat alone, alternating between ice & heat.  Experiment to what works for you.  Swelling and bruising can take several weeks to resolve.   It is helpful to take an over-the-counter pain medication regularly for the first few weeks: Ibuprofen (Motrin/Advil) - 200mg tabs - take 3 tabs (600mg) every 6 hours as needed for pain (unless you have been directed previously to avoid NSAIDs/ibuprofen) Acetaminophen (Tylenol) - you may take 650mg every 6 hours as needed. You can take this with motrin as they act differently on the body. If you are taking a narcotic pain medication that has acetaminophen in it, do not take over the counter tylenol at the same time. NOTE: You may take both of these medications together - most patients  find it most helpful when alternating between the two (i.e. Ibuprofen at 6am, tylenol at 9am, ibuprofen at 12pm ...) A  prescription for pain medication should be given to you upon discharge.  Take your pain medication as  prescribed if your pain is not adequatly controlled with the over-the-counter pain reliefs mentioned above.  Avoid getting constipated.  Between the surgery and the pain medications, it is common to experience some constipation.  Increasing fluid intake and taking a fiber supplement (such as Metamucil, Citrucel, FiberCon, MiraLax, etc) 1-2 times a day regularly will usually help prevent this problem from occurring.  A mild laxative (prune juice, Milk of Magnesia, MiraLax, etc) should be taken according to package directions if there are no bowel movements after 48 hours.    Dressing: Your incisions are covered in Dermabond which is like sterile superglue for the skin. This will come off on it's own in a couple weeks. It is waterproof and you may bathe normally starting the day after your surgery in a shower. Avoid baths/pools/lakes/oceans until your wounds have fully healed.  ACTIVITIES as tolerated:   Avoid heavy lifting (>10lbs or 1 gallon of milk) for the next 6 weeks. You may resume regular daily activities as tolerated--such as daily self-care, walking, climbing stairs--gradually increasing activities as tolerated.  If you can walk 30 minutes without difficulty, it is safe to try more intense activity such as jogging, treadmill, bicycling, low-impact aerobics.  DO NOT PUSH THROUGH PAIN.  Let pain be your guide: If it hurts to do something, don't do it. You may drive when you are no longer taking prescription pain medication, you can comfortably wear a seatbelt, and you can safely maneuver your car and apply brakes.  FOLLOW UP in our   office Please call [facility] at [phone] to set up an appointment to see your surgeon in the office for a follow-up appointment approximately 2 weeks after your surgery. Make sure that you call for this appointment the day you arrive home to insure a convenient appointment time.  9. If you have disability or family leave forms that need to be completed, you may have  them completed by your primary care physician's office; for return to work instructions, please ask our office staff and they will be happy to assist you in obtaining this documentation   When to call us [phone]: Poor pain control Reactions / problems with new medications (rash/itching, etc)  Fever over 101.5 F (38.5 C) Inability to urinate Nausea/vomiting Worsening swelling or bruising Continued bleeding from incision. Increased pain, redness, or drainage from the incision  The clinic staff is available to answer your questions during regular business hours (8:30am-5pm).  Please don't hesitate to call and ask to speak to one of our nurses for clinical concerns.   A surgeon from [facility] is always on call at the hospitals   If you have a medical emergency, go to the nearest emergency room or call 911.  [facility] [address], [address], [place], [place]  [address] MAIN: [phone] FAX: [phone] [email or web address]  

## 2021-09-08 NOTE — Transfer of Care (Signed)
Immediate Anesthesia Transfer of Care Note  Patient: Jamie [name]  Procedure(s) Performed: XI ROBOT ASSISTED SIGMOIDECTOMY VS LOW ANTERIOR RESECTION, TAKEDOWN OF COLOVESICAL FISTULA FLEXIBLE SIGMOIDOSCOPY LYSIS OF ADHESION CYSTOSCOPY WITH STENT PLACEMENT (Bilateral)  Patient Location: PACU  Anesthesia Type:General  Level of Consciousness: awake, alert  and oriented  Airway & Oxygen Therapy: Patient Spontanous Breathing and Patient connected to face mask oxygen  Post-op Assessment: Report given to RN and Post -op Vital signs reviewed and stable  Post vital signs: Reviewed and stable  Last Vitals:  Vitals Value Taken Time  BP 124/72 09/08/21 1108  Temp    Pulse 83 09/08/21 1110  Resp 16 09/08/21 1110  SpO2 100 % 09/08/21 1110  Vitals shown include unvalidated device data.  Last Pain:  Vitals:   09/08/21 0555  TempSrc:   PainSc: 2       Patients Stated Pain Goal: 3 (94/50/38 8828)  Complications: No notable events documented.

## 2021-09-09 ENCOUNTER — Encounter (HOSPITAL_COMMUNITY): Payer: Self-pay | Admitting: Surgery

## 2021-09-09 LAB — GLUCOSE, CAPILLARY
Glucose-Capillary: 89 mg/dL (ref 70–99)
Glucose-Capillary: 83 mg/dL (ref 70–99)
Glucose-Capillary: 121 mg/dL — ABNORMAL HIGH (ref 70–99)
Glucose-Capillary: 71 mg/dL (ref 70–99)
Glucose-Capillary: 97 mg/dL (ref 70–99)

## 2021-09-09 LAB — CBC
HCT: 31.2 % — ABNORMAL LOW (ref 36.0–46.0)
Hemoglobin: 10.7 g/dL — ABNORMAL LOW (ref 12.0–15.0)
MCH: 31.8 pg (ref 26.0–34.0)
MCHC: 34.3 g/dL (ref 30.0–36.0)
MCV: 92.9 fL (ref 80.0–100.0)
Platelets: 248 10*3/uL (ref 150–400)
RBC: 3.36 MIL/uL — ABNORMAL LOW (ref 3.87–5.11)
RDW: 12.2 % (ref 11.5–15.5)
WBC: 12 10*3/uL — ABNORMAL HIGH (ref 4.0–10.5)
nRBC: 0 % (ref 0.0–0.2)

## 2021-09-09 LAB — BASIC METABOLIC PANEL
Anion gap: 7 (ref 5–15)
BUN: 12 mg/dL (ref 8–23)
CO2: 27 mmol/L (ref 22–32)
Calcium: 9.1 mg/dL (ref 8.9–10.3)
Chloride: 106 mmol/L (ref 98–111)
Creatinine, Ser: 0.82 mg/dL (ref 0.44–1.00)
GFR, Estimated: >60 mL/min (ref >60)
Glucose, Bld: 102 mg/dL — ABNORMAL HIGH (ref 70–99)
Potassium: 3.8 mmol/L (ref 3.5–5.1)
Sodium: 140 mmol/L (ref 135–145)

## 2021-09-09 MED ORDER — CHLORHEXIDINE GLUCONATE CLOTH 2 % EX PADS
6.0000 | MEDICATED_PAD | Freq: Every day | CUTANEOUS | Status: DC
  Administered 2021-09-09 – 2021-09-10 (×2): 6 via TOPICAL

## 2021-09-09 NOTE — Progress Notes (Signed)
1 Day Post-Op   Subjective/Chief Complaint: Doing Ok.  No n/v.  Pain tolerable.  Passing gas.  No BM yet   Objective: Vital signs in last 24 hours: Temp:  [97.3 F (36.3 C)-98.5 F (36.9 C)] 98.4 F (36.9 C) (01/28 0934) Pulse Rate:  [63-91] 78 (01/28 0934) Resp:  [9-18] 14 (01/28 0934) BP: (100-135)/(51-77) 115/53 (01/28 0934) SpO2:  [94 %-100 %] 100 % (01/28 0934) Last BM Date: 09/08/21  Intake/Output from previous day: 01/27 0701 - 01/28 0700 In: 3586.2 [P.O.:510; I.V.:2926.2; IV Piggyback:150] Out: 1500 [Urine:1425; Emesis/NG output:50; Blood:25] Intake/Output this shift: Total I/O In: 480 [P.O.:480] Out: 1500 [Urine:1500]  General appearance: alert, cooperative, and no distress Resp: breathing comfortably GI: soft, non distended, approp tender at incisions.   Extremities: extremities normal, atraumatic, no cyanosis or edema  Lab Results:  Recent Labs    09/09/21 0442  WBC 12.0*  HGB 10.7*  HCT 31.2*  PLT 248   BMET Recent Labs    09/09/21 0442  NA 140  K 3.8  CL 106  CO2 27  GLUCOSE 102*  BUN 12  CREATININE 0.82  CALCIUM 9.1   PT/INR No results for input(s): LABPROT, INR in the last 72 hours. ABG No results for input(s): PHART, HCO3 in the last 72 hours.  Invalid input(s): PCO2, PO2  Studies/Results: DG C-Arm 1-60 Min-No Report  Result Date: 09/08/2021 Fluoroscopy was utilized by the requesting physician.  No radiographic interpretation.    Anti-infectives: Anti-infectives (From admission, onward)    Start     Dose/Rate Route Frequency Ordered Stop   09/08/21 1400  neomycin (MYCIFRADIN) tablet 1,000 mg  Status:  Discontinued       See Hyperspace for full Linked Orders Report.   1,000 mg Oral 3 times per day 09/08/21 0512 09/08/21 0513   09/08/21 1400  metroNIDAZOLE (FLAGYL) tablet 1,000 mg  Status:  Discontinued       See Hyperspace for full Linked Orders Report.   1,000 mg Oral 3 times per day 09/08/21 0512 09/08/21 0513   09/08/21  1400  acyclovir (ZOVIRAX) 200 MG capsule 800 mg  Status:  Discontinued       Note to Pharmacy: For 7 days.     800 mg Oral 5 times daily 09/08/21 1241 09/08/21 1245   09/08/21 0600  cefoTEtan (CEFOTAN) 2 g in sodium chloride 0.9 % 100 mL IVPB        2 g 200 mL/hr over 30 Minutes Intravenous On call to O.R. 09/08/21 0512 09/08/21 0815       Assessment/Plan: s/p Procedure(s): 09/08/21 XI ROBOT ASSISTED SIGMOIDECTOMY VS LOW ANTERIOR RESECTION, TAKEDOWN OF COLOVESICAL FISTULA (N/A) FLEXIBLE SIGMOIDOSCOPY (N/A)- Dr. [name] CYSTOSCOPY WITH STENT PLACEMENT (Bilateral)- Dr. Santiago Bur OF ADHESION (N/A)   Plan to remove foley Sunday 1/29. Advance diet. Ice packs Pulmonary toilet Pain control with prn ibuprofen, tramadol, flexeril, gabapentin, dilaudid.  Has only needed the ibuprofen.   Hope for d/c tomorrow or Monday if tolerates diet and has BM.    LOS: 1 day    Stark Klein 09/09/2021

## 2021-09-09 NOTE — Evaluation (Signed)
Physical Therapy Evaluation Patient Details Name: Jamie [name] MRN: [id number] DOB: October 12, 1950 Today's Date: 09/09/2021  History of Present Illness  Jamie [name] is an 71 y.o. female with history of HTN, GERD, hypothyroidism, and DM is s/p Robotic assisted low anterior resection  and cystoscopy.  Clinical Impression  Pt admitted as above and presenting with functional mobility limitations 2* generalized weakness, post op pain and mild ambulatory balance deficits.  Pt should progress to dc home with assist of family.     Recommendations for follow up therapy are one component of a multi-disciplinary discharge planning process, led by the attending physician.  Recommendations may be updated based on patient status, additional functional criteria and insurance authorization.  Follow Up Recommendations No PT follow up    Assistance Recommended at Discharge Intermittent Supervision/Assistance  Patient can return home with the following  A little help with walking and/or transfers;A little help with bathing/dressing/bathroom;Help with stairs or ramp for entrance;Assist for transportation;Assistance with cooking/housework    Equipment Recommendations Rolling walker (2 wheels);BSC/3in1  Recommendations for Other Services       Functional Status Assessment Patient has had a recent decline in their functional status and demonstrates the ability to make significant improvements in function in a reasonable and predictable amount of time.     Precautions / Restrictions Precautions Precautions: Other (comment) Precaution Comments: abdominal incisions Restrictions Weight Bearing Restrictions: No      Mobility  Bed Mobility Overal bed mobility: Needs Assistance Bed Mobility: Rolling, Sit to Sidelying Rolling: Min guard       Sit to sidelying: Min guard General bed mobility comments: cues for log roll technique; pain limited    Transfers Overall transfer level: Modified  independent Equipment used: Rolling walker (2 wheels)               General transfer comment: cues for use of UEs to self assist    Ambulation/Gait Ambulation/Gait assistance: Min guard, Supervision Gait Distance (Feet): 200 Feet Assistive device: Rolling walker (2 wheels), 1 person hand held assist Gait Pattern/deviations: Step-through pattern, Decreased step length - right, Decreased step length - left, Shuffle, Trunk flexed Gait velocity: decr     General Gait Details: Pt ambulated 100' with min instability and good safety awareness.  Pt ambulated additional 100' sans assistive device but with decreased pace, increased instabilty but no overt LOB and increased abdominal pain.  Stairs            Wheelchair Mobility    Modified Rankin (Stroke Patients Only)       Balance Overall balance assessment: Mild deficits observed, not formally tested                                           Pertinent Vitals/Pain Pain Assessment Pain Assessment: 0-10 Pain Score: 4  Pain Descriptors / Indicators: Aching, Sore Pain Intervention(s): Limited activity within patient's tolerance, Monitored during session, Premedicated before session    Home Living Family/patient expects to be discharged to:: Private residence Living Arrangements: Children Available Help at Discharge: Available PRN/intermittently Type of Home: House Home Access: Stairs to enter Entrance Stairs-Rails: Left Entrance Stairs-Number of Steps: 6   Home Layout: One level Home Equipment: None      Prior Function Prior Level of Function : Independent/Modified Independent  Hand Dominance   Dominant Hand: Right    Extremity/Trunk Assessment   Upper Extremity Assessment Upper Extremity Assessment: Defer to OT evaluation    Lower Extremity Assessment Lower Extremity Assessment: Generalized weakness    Cervical / Trunk Assessment Cervical / Trunk  Assessment: Normal  Communication   Communication: No difficulties  Cognition Arousal/Alertness: Awake/alert Behavior During Therapy: WFL for tasks assessed/performed Overall Cognitive Status: Within Functional Limits for tasks assessed                                          General Comments      Exercises     Assessment/Plan    PT Assessment Patient needs continued PT services  PT Problem List Decreased strength;Decreased activity tolerance;Decreased balance;Decreased mobility;Decreased knowledge of use of DME;Pain       PT Treatment Interventions DME instruction;Gait training;Stair training;Functional mobility training;Therapeutic activities;Therapeutic exercise;Balance training;Patient/family education    PT Goals (Current goals can be found in the Care Plan section)  Acute Rehab PT Goals Patient Stated Goal: Regain IND PT Goal Formulation: With patient Time For Goal Achievement: 09/22/21 Potential to Achieve Goals: Good    Frequency Min 3X/week     Co-evaluation               AM-PAC PT "6 Clicks" Mobility  Outcome Measure Help needed turning from your back to your side while in a flat bed without using bedrails?: A Little Help needed moving from lying on your back to sitting on the side of a flat bed without using bedrails?: A Little Help needed moving to and from a bed to a chair (including a wheelchair)?: A Little Help needed standing up from a chair using your arms (e.g., wheelchair or bedside chair)?: A Little Help needed to walk in hospital room?: A Little Help needed climbing 3-5 steps with a railing? : A Lot 6 Click Score: 17    End of Session   Activity Tolerance: Patient limited by fatigue;Patient tolerated treatment well;Patient limited by pain Patient left: in bed;with call bell/phone within reach;with bed alarm set Nurse Communication: Mobility status PT Visit Diagnosis: Unsteadiness on feet (R26.81);Difficulty in walking,  not elsewhere classified (R26.2);Pain    Time: 1005-1027 PT Time Calculation (min) (ACUTE ONLY): 22 min   Charges:   PT Evaluation $PT Eval Low Complexity: 1 Low          [place] Pager [phone] Office [phone]   Cedarius Kersh 09/09/2021, 12:59 PM

## 2021-09-09 NOTE — Evaluation (Signed)
Occupational Therapy Evaluation Patient Details Name: Jamie [name] MRN: [id number] DOB: July 07, 1951 Today's Date: 09/09/2021   History of Present Illness Jamie [name] is an 71 y.o. female with history of HTN, GERD, hypothyroidism, and DM is s/p Robotic assisted low anterior resection  and cystoscopy.   Clinical Impression   Jamie [name] is a 71 year old woman s/p abdominal surgery with mild complaints of pain. Patient demonstrated ability to perform bed mobility, ambulation in hall with RW and ADLs with modified positioning. Therapist recommended use of shower chair at home until balance and endurance improves but otherwise patient has no further OT needs.       Recommendations for follow up therapy are one component of a multi-disciplinary discharge planning process, led by the attending physician.  Recommendations may be updated based on patient status, additional functional criteria and insurance authorization.   Follow Up Recommendations  No OT follow up    Assistance Recommended at Discharge PRN  Patient can return home with the following Assistance with cooking/housework    Functional Status Assessment  Patient has not had a recent decline in their functional status  Equipment Recommendations  None recommended by OT    Recommendations for Other Services       Precautions / Restrictions Precautions Precautions: Other (comment) Precaution Comments: abdominal incisions Restrictions Weight Bearing Restrictions: No      Mobility Bed Mobility Overal bed mobility: Modified Independent                  Transfers Overall transfer level: Modified independent Equipment used: Rolling walker (2 wheels)               General transfer comment: Used rolling walker to ambulate with as she reports decreased balance from surgery and prolonged bedrest.      Balance Overall balance assessment: Mild deficits observed, not formally tested                                          ADL either performed or assessed with clinical judgement   ADL Overall ADL's : Modified independent                                       General ADL Comments: Increased time and differing positioning needed to compensate for pain but otherwise independent.     Vision Patient Visual Report: No change from baseline       Perception     Praxis      Pertinent Vitals/Pain Pain Assessment Pain Assessment: 0-10 Pain Score: 4  Pain Descriptors / Indicators: Aching, Sore Pain Intervention(s): Limited activity within patient's tolerance     Hand Dominance Right   Extremity/Trunk Assessment Upper Extremity Assessment Upper Extremity Assessment: Overall WFL for tasks assessed   Lower Extremity Assessment Lower Extremity Assessment: Defer to PT evaluation   Cervical / Trunk Assessment Cervical / Trunk Assessment: Normal   Communication Communication Communication: No difficulties   Cognition Arousal/Alertness: Awake/alert Behavior During Therapy: WFL for tasks assessed/performed Overall Cognitive Status: Within Functional Limits for tasks assessed                                       General Comments  Exercises     Shoulder Instructions      Home Living Family/patient expects to be discharged to:: Private residence Living Arrangements: Children Available Help at Discharge: Available PRN/intermittently (daughter works) Type of Home: House Home Access: Stairs to enter Technical brewer of Steps: 6 Entrance Stairs-Rails: Left Home Layout: One level     Bathroom Shower/Tub: Teacher, early years/pre: Handicapped height     Home Equipment: None          Prior Functioning/Environment Prior Level of Function : Independent/Modified Independent                        OT Problem List: Pain      OT Treatment/Interventions:      OT Goals(Current goals can be found in  the care plan section) Acute Rehab OT Goals OT Goal Formulation: All assessment and education complete, DC therapy  OT Frequency:      Co-evaluation              AM-PAC OT "6 Clicks" Daily Activity     Outcome Measure Help from another person eating meals?: None Help from another person taking care of personal grooming?: None Help from another person toileting, which includes using toliet, bedpan, or urinal?: None Help from another person bathing (including washing, rinsing, drying)?: None Help from another person to put on and taking off regular upper body clothing?: None Help from another person to put on and taking off regular lower body clothing?: None 6 Click Score: 24   End of Session Equipment Utilized During Treatment: Rolling walker (2 wheels) Nurse Communication:  (okay to see)  Activity Tolerance: Patient tolerated treatment well Patient left: with call bell/phone within reach;in bed  OT Visit Diagnosis: Pain                Time: 7342-8768 OT Time Calculation (min): 21 min Charges:  OT General Charges $OT Visit: 1 Visit OT Evaluation $OT Eval Low Complexity: 1 Low  Myra Weng, OTR/L [place]  Office [phone] Pager: [place] 09/09/2021, 8:37 AM

## 2021-09-10 ENCOUNTER — Encounter: Payer: Self-pay | Admitting: Physician Assistant

## 2021-09-10 LAB — GLUCOSE, CAPILLARY
Glucose-Capillary: 89 mg/dL (ref 70–99)
Glucose-Capillary: 88 mg/dL (ref 70–99)
Glucose-Capillary: 95 mg/dL (ref 70–99)
Glucose-Capillary: 108 mg/dL — ABNORMAL HIGH (ref 70–99)

## 2021-09-10 LAB — CBC
HCT: 30.2 % — ABNORMAL LOW (ref 36.0–46.0)
Hemoglobin: 10.3 g/dL — ABNORMAL LOW (ref 12.0–15.0)
MCH: 32 pg (ref 26.0–34.0)
MCHC: 34.1 g/dL (ref 30.0–36.0)
MCV: 93.8 fL (ref 80.0–100.0)
Platelets: 212 10*3/uL (ref 150–400)
RBC: 3.22 MIL/uL — ABNORMAL LOW (ref 3.87–5.11)
RDW: 12.2 % (ref 11.5–15.5)
WBC: 8.7 10*3/uL (ref 4.0–10.5)
nRBC: 0 % (ref 0.0–0.2)

## 2021-09-10 LAB — BASIC METABOLIC PANEL
Anion gap: 4 — ABNORMAL LOW (ref 5–15)
BUN: 10 mg/dL (ref 8–23)
CO2: 28 mmol/L (ref 22–32)
Calcium: 8.6 mg/dL — ABNORMAL LOW (ref 8.9–10.3)
Chloride: 108 mmol/L (ref 98–111)
Creatinine, Ser: 0.75 mg/dL (ref 0.44–1.00)
GFR, Estimated: >60 mL/min (ref >60)
Glucose, Bld: 82 mg/dL (ref 70–99)
Potassium: 4 mmol/L (ref 3.5–5.1)
Sodium: 140 mmol/L (ref 135–145)

## 2021-09-10 MED ORDER — SODIUM CHLORIDE 0.9 % IV BOLUS
1000.0000 mL | Freq: Once | INTRAVENOUS | Status: AC
  Administered 2021-09-10: 1000 mL via INTRAVENOUS

## 2021-09-10 NOTE — Plan of Care (Signed)
°  Problem: Education: Goal: Knowledge of General Education information will improve Description: Including pain rating scale, medication(s)/side effects and non-pharmacologic comfort measures Outcome: Progressing   Problem: Health Behavior/Discharge Planning: Goal: Ability to manage health-related needs will improve Outcome: Progressing   Problem: Clinical Measurements: Goal: Ability to maintain clinical measurements within normal limits will improve Outcome: Progressing Goal: Will remain free from infection Outcome: Progressing Goal: Diagnostic test results will improve Outcome: Progressing Goal: Respiratory complications will improve Outcome: Progressing Goal: Cardiovascular complication will be avoided Outcome: Progressing   Problem: Activity: Goal: Risk for activity intolerance will decrease Outcome: Progressing   Problem: Nutrition: Goal: Adequate nutrition will be maintained Outcome: Progressing   Problem: Coping: Goal: Level of anxiety will decrease Outcome: Progressing   Problem: Elimination: Goal: Will not experience complications related to bowel motility Outcome: Progressing Goal: Will not experience complications related to urinary retention Outcome: Progressing   Problem: Pain Managment: Goal: General experience of comfort will improve Outcome: Progressing   Problem: Safety: Goal: Ability to remain free from injury will improve Outcome: Progressing   Problem: Skin Integrity: Goal: Risk for impaired skin integrity will decrease Outcome: Progressing   Problem: Education: Goal: Required Educational Video(s) Outcome: Progressing   Problem: Clinical Measurements: Goal: Ability to maintain clinical measurements within normal limits will improve Outcome: Progressing Goal: Postoperative complications will be avoided or minimized Outcome: Progressing   Problem: Education: Goal: Knowledge of General Education information will improve Description:  Including pain rating scale, medication(s)/side effects and non-pharmacologic comfort measures Outcome: Progressing   Problem: Health Behavior/Discharge Planning: Goal: Ability to manage health-related needs will improve Outcome: Progressing   Problem: Clinical Measurements: Goal: Ability to maintain clinical measurements within normal limits will improve Outcome: Progressing Goal: Will remain free from infection Outcome: Progressing Goal: Diagnostic test results will improve Outcome: Progressing Goal: Respiratory complications will improve Outcome: Progressing Goal: Cardiovascular complication will be avoided Outcome: Progressing   Problem: Activity: Goal: Risk for activity intolerance will decrease Outcome: Progressing   Problem: Nutrition: Goal: Adequate nutrition will be maintained Outcome: Progressing   Problem: Coping: Goal: Level of anxiety will decrease Outcome: Progressing   Problem: Elimination: Goal: Will not experience complications related to bowel motility Outcome: Progressing Goal: Will not experience complications related to urinary retention Outcome: Progressing   Problem: Pain Managment: Goal: General experience of comfort will improve Outcome: Progressing   Problem: Safety: Goal: Ability to remain free from injury will improve Outcome: Progressing   Problem: Skin Integrity: Goal: Risk for impaired skin integrity will decrease Outcome: Progressing   Problem: Education: Goal: Required Educational Video(s) Outcome: Progressing   Problem: Clinical Measurements: Goal: Ability to maintain clinical measurements within normal limits will improve Outcome: Progressing Goal: Postoperative complications will be avoided or minimized Outcome: Progressing

## 2021-09-10 NOTE — Progress Notes (Signed)
2 Days Post-Op   Subjective/Chief Complaint: Still passing some gas, but no BM yet.  Also feeling a little lightheaded.  BP lower than baseline.  Blood sugars have also been up and down with low of 71 and high of 196.  Feeling pretty sore this AM.     Objective: Vital signs in last 24 hours: Temp:  [98.3 F (36.8 C)-99 F (37.2 C)] 99 F (37.2 C) (01/29 0517) Pulse Rate:  [78-91] 91 (01/29 0517) Resp:  [14-15] 15 (01/29 0517) BP: (103-115)/(53-62) 103/54 (01/29 0517) SpO2:  [97 %-100 %] 97 % (01/29 0517) Last BM Date: 09/08/21  Intake/Output from previous day: 01/28 0701 - 01/29 0700 In: 3304.8 [P.O.:2700; I.V.:604.8] Out: 3700 [Urine:3700] Intake/Output this shift: No intake/output data recorded.  General appearance: sleeping, but arousable. cooperative, mild distress Resp: breathing comfortably GI: soft, non distended, approp tender at incisions.   Extremities: extremities normal, atraumatic, no cyanosis or edema  Lab Results:  Recent Labs    09/09/21 0442 09/10/21 0405  WBC 12.0* 8.7  HGB 10.7* 10.3*  HCT 31.2* 30.2*  PLT 248 212   BMET Recent Labs    09/09/21 0442 09/10/21 0405  NA 140 140  K 3.8 4.0  CL 106 108  CO2 27 28  GLUCOSE 102* 82  BUN 12 10  CREATININE 0.82 0.75  CALCIUM 9.1 8.6*   PT/INR No results for input(s): LABPROT, INR in the last 72 hours. ABG No results for input(s): PHART, HCO3 in the last 72 hours.  Invalid input(s): PCO2, PO2  Studies/Results: No results found.  Anti-infectives: Anti-infectives (From admission, onward)    Start     Dose/Rate Route Frequency Ordered Stop   09/08/21 1400  neomycin (MYCIFRADIN) tablet 1,000 mg  Status:  Discontinued       See Hyperspace for full Linked Orders Report.   1,000 mg Oral 3 times per day 09/08/21 0512 09/08/21 0513   09/08/21 1400  metroNIDAZOLE (FLAGYL) tablet 1,000 mg  Status:  Discontinued       See Hyperspace for full Linked Orders Report.   1,000 mg Oral 3 times per day  09/08/21 0512 09/08/21 0513   09/08/21 1400  acyclovir (ZOVIRAX) 200 MG capsule 800 mg  Status:  Discontinued       Note to Pharmacy: For 7 days.     800 mg Oral 5 times daily 09/08/21 1241 09/08/21 1245   09/08/21 0600  cefoTEtan (CEFOTAN) 2 g in sodium chloride 0.9 % 100 mL IVPB        2 g 200 mL/hr over 30 Minutes Intravenous On call to O.R. 09/08/21 0512 09/08/21 0815       Assessment/Plan: s/p Procedure(s): 09/08/21 XI ROBOT ASSISTED SIGMOIDECTOMY VS LOW ANTERIOR RESECTION, TAKEDOWN OF COLOVESICAL FISTULA (N/A) FLEXIBLE SIGMOIDOSCOPY (N/A)- Dr. [name] CYSTOSCOPY WITH STENT PLACEMENT (Bilateral)- Dr. Jeffie Pollock LYSIS OF ADHESION (N/A)  D/c foley today Give IV fluid bolus for dry mouth and lower BPs.    Ice packs Pulmonary toilet Pain control with prn ibuprofen, tramadol, flexeril, gabapentin, dilaudid.  Has only needed the ibuprofen.   Hope for d/c tomorrow if diet tolerated and pt has BM.    LOS: 2 days    Stark Klein 09/10/2021

## 2021-09-10 NOTE — Progress Notes (Signed)
Called to pt's room several times after IV bolus hung. Pt upset, teary-eyed that "something is wrong!" IV site checked, many questions answered about IV bolus, pt cont to be upset and insist something doesn't feel right. Fluids slowed down to 500/hr, IV site remains unremarkable. Called back after 10 minutes and pt insisting that I take her IV out. Good blood return noted but pt crying at times, so IV removed and bolus stopped. Instructed pt on foley removal, pt surprised that MD is not taking her foley cath out. Explained removal process and many questions answered. Allowed pt to vent concerns and support offered. Pt requesting that her BP and P be retaken. Explained to pt that they were just assessed approx one hr ago and they were normal. Offered pt prn med for anxiety and pt declined, stated that she wanted to leave her foley in for now. Will cont to monitor.

## 2021-09-10 NOTE — Progress Notes (Signed)
Patient has ambulated 2 rounds at the hallway tonight. Patient stated that she had 2 very small bowel movements earlier. No abdominal pain at the moment.

## 2021-09-11 LAB — CBC
HCT: 33.3 % — ABNORMAL LOW (ref 36.0–46.0)
Hemoglobin: 11.3 g/dL — ABNORMAL LOW (ref 12.0–15.0)
MCH: 31.9 pg (ref 26.0–34.0)
MCHC: 33.9 g/dL (ref 30.0–36.0)
MCV: 94.1 fL (ref 80.0–100.0)
Platelets: 242 10*3/uL (ref 150–400)
RBC: 3.54 MIL/uL — ABNORMAL LOW (ref 3.87–5.11)
RDW: 12 % (ref 11.5–15.5)
WBC: 7.1 10*3/uL (ref 4.0–10.5)
nRBC: 0 % (ref 0.0–0.2)

## 2021-09-11 LAB — BASIC METABOLIC PANEL
Anion gap: 6 (ref 5–15)
BUN: 9 mg/dL (ref 8–23)
CO2: 28 mmol/L (ref 22–32)
Calcium: 8.9 mg/dL (ref 8.9–10.3)
Chloride: 104 mmol/L (ref 98–111)
Creatinine, Ser: 0.85 mg/dL (ref 0.44–1.00)
GFR, Estimated: >60 mL/min (ref >60)
Glucose, Bld: 113 mg/dL — ABNORMAL HIGH (ref 70–99)
Potassium: 3.5 mmol/L (ref 3.5–5.1)
Sodium: 138 mmol/L (ref 135–145)

## 2021-09-11 LAB — SURGICAL PATHOLOGY

## 2021-09-11 LAB — GLUCOSE, CAPILLARY
Glucose-Capillary: 77 mg/dL (ref 70–99)
Glucose-Capillary: 84 mg/dL (ref 70–99)

## 2021-09-11 NOTE — Plan of Care (Signed)
°  Problem: Education: Goal: Knowledge of General Education information will improve Description: Including pain rating scale, medication(s)/side effects and non-pharmacologic comfort measures Outcome: Adequate for Discharge   Problem: Health Behavior/Discharge Planning: Goal: Ability to manage health-related needs will improve Outcome: Adequate for Discharge   Problem: Clinical Measurements: Goal: Ability to maintain clinical measurements within normal limits will improve Outcome: Adequate for Discharge Goal: Will remain free from infection Outcome: Adequate for Discharge Goal: Diagnostic test results will improve Outcome: Adequate for Discharge Goal: Respiratory complications will improve Outcome: Adequate for Discharge Goal: Cardiovascular complication will be avoided Outcome: Adequate for Discharge   Problem: Activity: Goal: Risk for activity intolerance will decrease Outcome: Adequate for Discharge   Problem: Nutrition: Goal: Adequate nutrition will be maintained Outcome: Adequate for Discharge   Problem: Coping: Goal: Level of anxiety will decrease Outcome: Adequate for Discharge   Problem: Elimination: Goal: Will not experience complications related to bowel motility Outcome: Adequate for Discharge Goal: Will not experience complications related to urinary retention Outcome: Adequate for Discharge   Problem: Pain Managment: Goal: General experience of comfort will improve Outcome: Adequate for Discharge   Problem: Safety: Goal: Ability to remain free from injury will improve Outcome: Adequate for Discharge   Problem: Skin Integrity: Goal: Risk for impaired skin integrity will decrease Outcome: Adequate for Discharge   Problem: Education: Goal: Required Educational Video(s) Outcome: Adequate for Discharge   Problem: Clinical Measurements: Goal: Ability to maintain clinical measurements within normal limits will improve Outcome: Adequate for  Discharge Goal: Postoperative complications will be avoided or minimized Outcome: Adequate for Discharge

## 2021-09-11 NOTE — Plan of Care (Signed)
°  Problem: Clinical Measurements: Goal: Diagnostic test results will improve Outcome: Not Progressing   Problem: Activity: Goal: Risk for activity intolerance will decrease Outcome: Not Progressing   Problem: Nutrition: Goal: Adequate nutrition will be maintained Outcome: Not Progressing   Problem: Pain Managment: Goal: General experience of comfort will improve Outcome: Not Progressing   Problem: Elimination: Goal: Will not experience complications related to bowel motility Outcome: Not Progressing

## 2021-09-11 NOTE — Discharge Summary (Signed)
Patient ID: Jamie [name] MRN: [id number] DOB/AGE: 02-08-1951 71 y.o.  Admit date: 09/08/2021 Discharge date: 09/11/2021  Discharge Diagnoses Patient Active Problem List   Diagnosis Date Noted   S/P laparoscopic-assisted sigmoidectomy 09/08/2021   Tuberculosis exposure 06/21/2021   Exposure to hepatitis C 06/21/2021   Post herpetic neuralgia 06/21/2021   Herpes zoster without complication 05/69/7948   Bilateral tinnitus 12/23/2020   Leg cramping 12/23/2020   Left ear impacted cerumen 12/23/2020   Acute non-recurrent maxillary sinusitis 12/23/2020   Osteoporosis 12/14/2020   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   Unintentional weight loss 07/22/2020   Impacted cerumen, bilateral 02/09/2020   Neuropathy 02/02/2020   Viral respiratory infection 12/04/2019   GAD (generalized anxiety disorder) 10/12/2019   Hives of unknown origin 10/12/2019   Anaphylactic syndrome 10/12/2019   Moderate protein-calorie malnutrition ([place]) 10/07/2019   Allergic reaction 09/25/2019   Recurrent infections 09/25/2019   Seasonal allergic rhinitis due to pollen 09/25/2019   OAB (overactive bladder) 09/21/2019   Stress due to family tension 07/15/2019   Epigastric pain 07/06/2019   Tachycardia 01/02/2019   Numbness and tingling of right side of face 12/31/2018   Right-sided headache 12/31/2018   Cervical radiculopathy 10/01/2018   Vaginal irritation 09/15/2018   Anal itching 09/15/2018   Frequent urination 06/13/2018   ETD (Eustachian tube dysfunction), right 06/11/2018   Fatigue 01/15/2018   Heel pain, bilateral 01/15/2018   Muscle strain of upper back 01/15/2018   Acute bilateral low back pain with bilateral sciatica 10/07/2017   Pelvic floor weakness 09/16/2017   Bilateral headaches 09/16/2017   Adverse food reaction 09/02/2017   Vitamin D deficiency 08/23/2017   Iron deficiency anemia secondary to inadequate dietary iron intake 08/23/2017   Abnormal urine odor 08/23/2017   Atypical chest pain  08/23/2017   Abdominal bloating 08/23/2017   Anxiety about health 08/23/2017   Carotid stenosis, asymptomatic, bilateral 05/08/2017   History of excessive cerumen 05/08/2017   Cardiovascular risk factor 05/08/2017   Gastritis and duodenitis 04/15/2017   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   Splenic artery aneurysm ([place]) 03/22/2017   Vision changes 01/25/2017   Hematuria 01/25/2017   Hx of diverticulitis of colon 01/25/2017   Acute left lower quadrant pain 01/25/2017   Exposure to chemical inhalation 11/06/2016   Right knee pain 11/05/2016   Former smoker 05/14/2016   Hypopigmentation 01/11/2016   Anaphylactic reaction due to food 01/11/2016   Vaginal dryness 01/11/2016   Multiple food allergies 01/11/2016   Adhesive capsulitis of shoulder 10/17/2015   Carpal tunnel syndrome 10/17/2015   Left arm numbness 10/13/2015   Neck pain 10/13/2015   Pernicious anemia 10/13/2015   Chronically dry eyes 10/13/2015   DDD (degenerative disc disease), cervical 08/31/2015   Mid back pain on left side 07/20/2015   Hair loss 07/20/2015   Family history of renal cancer 07/20/2015   History of shingles 07/19/2015   Low iron stores 07/19/2015   Subclinical hypothyroidism 07/19/2015   Constipation 07/19/2015   Hyperpigmentation of skin 07/19/2015   Depression with anxiety 06/18/2015   Normocytic anemia 06/18/2015   Diverticulitis of large intestine with abscess without bleeding    B12 deficiency 06/13/2015   Migraine with aura and with status migrainosus, not intractable 04/20/2015   Lung mass 04/20/2015   Generalized abdominal pain 01/24/2014   Osteopenia 01/20/2014   Nausea 01/29/2012   Seasonal and perennial allergic rhinitis 11/06/2011   Diverticulosis of large intestine 07/03/2011   Hyperlipidemia 12/13/2010   ONYCHOMYCOSIS, TOENAILS 06/09/2010   ANXIETY DEPRESSION 06/09/2010  Consultants None  Procedures OR 09/08/21 -  Robotic assisted low anterior resection with double stapled  colorectal anastomosis Takedown of colovesical fistula Robotic lysis of adhesions x 90 minutes Flexible sigmoidoscopy Bilateral transversus abdominus plane (TAP) blocks  Hospital Course: She was admitted postoperatively where she recovered appropriately. Her diet was gradually advanced. She began having spontaneous bowel function. On POD#3 she was noted to be tolerating a regular diet, moving her bowels, mobilizing well on her own with rolling walker, pain well controlled, voiding without difficulty. She was comfortable with and stable for discharge home. Today, we also spent time reviewing her procedure and findings and answering related questions. Postoperative expectations were reviewed and follow-up in my office arranged.   Allergies as of 09/11/2021       Reactions   Dairycare [lactase-lactobacillus] Anaphylaxis   Iodine Rash   Ciprofloxacin    Facial numbness   Iodinated Contrast Media Other (See Comments)   Face and chest felt "hot", throat felt tight.    Peanut Butter Flavor Nausea And Vomiting   N/V   Shellfish Allergy Swelling        Medication List     TAKE these medications    Accu-Chek Guide Me w/Device Kit Dx R73.09 Elevated glucose - Check blood sugar 4 times daily.   acyclovir 200 MG capsule Commonly known as: Zovirax Take 4 capsules (800 mg total) by mouth 5 (five) times daily. For 7 days.   albuterol 108 (90 Base) MCG/ACT inhaler Commonly known as: Proventil HFA Inhale 2 puffs into the lungs every 6 (six) hours as needed for wheezing or shortness of breath.   ALPRAZolam 0.5 MG tablet Commonly known as: XANAX TAKE 1 TABLET BY MOUTH UP TO TWICE DAILY AS NEEDED What changed:  how much to take how to take this when to take this reasons to take this additional instructions   b complex vitamins capsule Take 1 capsule by mouth daily.   blood glucose meter kit and supplies Dispense based on patient and insurance preference. Use up to four times daily as  directed. 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   cyclobenzaprine 10 MG tablet Commonly known as: FLEXERIL Take 1 tablet (10 mg total) by mouth 2 (two) times daily as needed for muscle spasms.   diclofenac 75 MG EC tablet Commonly known as: VOLTAREN TAKE 1 TABLET(75 MG) BY MOUTH TWICE DAILY   EPINEPHrine 0.3 mg/0.3 mL Soaj injection Commonly known as: EPI-PEN INJECT 0.3 ML IN THE MUSCLE AS NEEDED FOR ANAPHYLAXIS   Foltanx 3-35-2 MG Tabs Take 1 tablet by mouth in the morning and at bedtime.   gabapentin 300 MG capsule Commonly known as: NEURONTIN TAKE 1 CAPSULE(300 MG) BY MOUTH THREE TIMES DAILY What changed:  how much to take how to take this when to take this reasons to take this additional instructions   ibuprofen 800 MG tablet Commonly known as: ADVIL Take 1 tablet (800 mg total) by mouth every 8 (eight) hours as needed.   Lancets 30G Misc Dx R73.09 Elevated glucose - Check blood sugar 4 times daily.   levothyroxine 50 MCG tablet Commonly known as: SYNTHROID TAKE 1 TABLET(50 MCG) BY MOUTH DAILY   lidocaine 5 % ointment Commonly known as: XYLOCAINE Apply 1 application topically daily.   lidocaine 5 % Commonly known as: Lidoderm Place 1 patch onto the skin daily. Remove & Discard patch within 12 hours or as directed by  MD   lubiprostone 8 MCG capsule Commonly known as: Amitiza Take 1 capsule (8 mcg total) by mouth 2 (two) times daily with a meal.   meclizine 25 MG tablet Commonly known as: ANTIVERT Take 1 tablet (25 mg total) by mouth 3 (three) times daily as needed for dizziness.   omeprazole 40 MG capsule Commonly known as: PRILOSEC TAKE 1 CAPSULE(40 MG) BY MOUTH DAILY What changed: See the new instructions.   OVER THE COUNTER MEDICATION Take 2 capsules by mouth daily. Friendly four   oxyCODONE-acetaminophen 5-325 MG tablet Commonly known as: PERCOCET/ROXICET Take 1  tablet by mouth every 6 (six) hours as needed for up to 8 doses for severe pain.   pantoprazole 40 MG tablet Commonly known as: PROTONIX Take 40 mg by mouth in the morning and at bedtime.   Pepcid 20 MG tablet Generic drug: famotidine Take 1 tablet (20 mg total) by mouth 2 (two) times daily. BRAND ONLY. What changed:  when to take this reasons to take this   Premarin vaginal cream Generic drug: conjugated estrogens APPLY VAGINALLY 3 TIMES A WEEK AT NIGHT   traMADol 50 MG tablet Commonly known as: ULTRAM Take 1 tablet (50 mg total) by mouth every 6 (six) hours as needed (postop pain not controlled with ibuprofen first).   vitamin B-12 1000 MCG tablet Commonly known as: CYANOCOBALAMIN Take 1,000 mcg by mouth daily.               Durable Medical Equipment  (From admission, onward)           Start     Ordered   09/11/21 0747  For home use only DME Walker rolling  Once       Question Answer Comment  Walker: With 5 Inch Wheels   Patient needs a walker to treat with the following condition S/P laparoscopic-assisted sigmoidectomy      09/11/21 0746              Follow-up Information     Ileana Roup, MD. Schedule an appointment as soon as possible for a visit in 2 week(s).   Specialties: General Surgery, Colon and Rectal Surgery Why: ~2-3 weeks for post surgery appointment Contact information: [address] [place] [id number] [phone]                 Sharon Mt. [name], M.D. [facility]

## 2021-09-11 NOTE — Progress Notes (Addendum)
Subjective No acute events. Feeling reasonably well. No n/v. Tolerating soft diet without issue. Ambulating well on her own.  Having flatus and Bms regularly - no blood. She declined foley removal yesterday, didn't know if nursing can remove this.  Objective: Vital signs in last 24 hours: Temp:  [98.9 F (37.2 C)-99 F (37.2 C)] 99 F (37.2 C) (01/30 0449) Pulse Rate:  [83-101] 101 (01/30 0449) Resp:  [15-18] 18 (01/30 0449) BP: (109-118)/(57-66) 118/58 (01/30 0449) SpO2:  [98 %-100 %] 100 % (01/30 0449) Weight:  [63.5 kg] 63.5 kg (01/30 0500) Last BM Date: 09/11/21  Intake/Output from previous day: 01/29 0701 - 01/30 0700 In: 1440 [P.O.:1440] Out: 3400 [Urine:3400] Intake/Output this shift: No intake/output data recorded.  Gen: NAD, comfortable CV: RRR Pulm: Normal work of breathing Abd: Soft, NT/ND, incisions c/d/I without erythema or drainage. No palpable hernias Ext: SCDs in place  Lab Results: CBC  Recent Labs    09/10/21 0405 09/11/21 0419  WBC 8.7 7.1  HGB 10.3* 11.3*  HCT 30.2* 33.3*  PLT 212 242   BMET Recent Labs    09/10/21 0405 09/11/21 0419  NA 140 138  K 4.0 3.5  CL 108 104  CO2 28 28  GLUCOSE 82 113*  BUN 10 9  CREATININE 0.75 0.85  CALCIUM 8.6* 8.9   PT/INR No results for input(s): LABPROT, INR in the last 72 hours. ABG No results for input(s): PHART, HCO3 in the last 72 hours.  Invalid input(s): PCO2, PO2  Studies/Results:  Anti-infectives: Anti-infectives (From admission, onward)    Start     Dose/Rate Route Frequency Ordered Stop   09/08/21 1400  neomycin (MYCIFRADIN) tablet 1,000 mg  Status:  Discontinued       See Hyperspace for full Linked Orders Report.   1,000 mg Oral 3 times per day 09/08/21 0512 09/08/21 0513   09/08/21 1400  metroNIDAZOLE (FLAGYL) tablet 1,000 mg  Status:  Discontinued       See Hyperspace for full Linked Orders Report.   1,000 mg Oral 3 times per day 09/08/21 0512 09/08/21 0513   09/08/21 1400   acyclovir (ZOVIRAX) 200 MG capsule 800 mg  Status:  Discontinued       Note to Pharmacy: For 7 days.     800 mg Oral 5 times daily 09/08/21 1241 09/08/21 1245   09/08/21 0600  cefoTEtan (CEFOTAN) 2 g in sodium chloride 0.9 % 100 mL IVPB        2 g 200 mL/hr over 30 Minutes Intravenous On call to O.R. 09/08/21 0512 09/08/21 0815        Assessment/Plan: Patient Active Problem List   Diagnosis Date Noted   S/P laparoscopic-assisted sigmoidectomy 09/08/2021   Tuberculosis exposure 06/21/2021   Exposure to hepatitis C 06/21/2021   Post herpetic neuralgia 06/21/2021   Herpes zoster without complication 16/05/9603   Bilateral tinnitus 12/23/2020   Leg cramping 12/23/2020   Left ear impacted cerumen 12/23/2020   Acute non-recurrent maxillary sinusitis 12/23/2020   Osteoporosis 12/14/2020   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   Unintentional weight loss 07/22/2020   Impacted cerumen, bilateral 02/09/2020   Neuropathy 02/02/2020   Viral respiratory infection 12/04/2019   GAD (generalized anxiety disorder) 10/12/2019   Hives of unknown origin 10/12/2019   Anaphylactic syndrome 10/12/2019   Moderate protein-calorie malnutrition ([place]) 10/07/2019   Allergic reaction 09/25/2019   Recurrent infections 09/25/2019   Seasonal allergic rhinitis due to pollen 09/25/2019   OAB (overactive bladder) 09/21/2019   Stress due to  family tension 07/15/2019   Epigastric pain 07/06/2019   Tachycardia 01/02/2019   Numbness and tingling of right side of face 12/31/2018   Right-sided headache 12/31/2018   Cervical radiculopathy 10/01/2018   Vaginal irritation 09/15/2018   Anal itching 09/15/2018   Frequent urination 06/13/2018   ETD (Eustachian tube dysfunction), right 06/11/2018   Fatigue 01/15/2018   Heel pain, bilateral 01/15/2018   Muscle strain of upper back 01/15/2018   Acute bilateral low back pain with bilateral sciatica 10/07/2017   Pelvic floor weakness 09/16/2017   Bilateral headaches  09/16/2017   Adverse food reaction 09/02/2017   Vitamin D deficiency 08/23/2017   Iron deficiency anemia secondary to inadequate dietary iron intake 08/23/2017   Abnormal urine odor 08/23/2017   Atypical chest pain 08/23/2017   Abdominal bloating 08/23/2017   Anxiety about health 08/23/2017   Carotid stenosis, asymptomatic, bilateral 05/08/2017   History of excessive cerumen 05/08/2017   Cardiovascular risk factor 05/08/2017   Gastritis and duodenitis 04/15/2017   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   Splenic artery aneurysm ([place]) 03/22/2017   Vision changes 01/25/2017   Hematuria 01/25/2017   Hx of diverticulitis of colon 01/25/2017   Acute left lower quadrant pain 01/25/2017   Exposure to chemical inhalation 11/06/2016   Right knee pain 11/05/2016   Former smoker 05/14/2016   Hypopigmentation 01/11/2016   Anaphylactic reaction due to food 01/11/2016   Vaginal dryness 01/11/2016   Multiple food allergies 01/11/2016   Adhesive capsulitis of shoulder 10/17/2015   Carpal tunnel syndrome 10/17/2015   Left arm numbness 10/13/2015   Neck pain 10/13/2015   Pernicious anemia 10/13/2015   Chronically dry eyes 10/13/2015   DDD (degenerative disc disease), cervical 08/31/2015   Mid back pain on left side 07/20/2015   Hair loss 07/20/2015   Family history of renal cancer 07/20/2015   History of shingles 07/19/2015   Low iron stores 07/19/2015   Subclinical hypothyroidism 07/19/2015   Constipation 07/19/2015   Hyperpigmentation of skin 07/19/2015   Depression with anxiety 06/18/2015   Normocytic anemia 06/18/2015   Diverticulitis of large intestine with abscess without bleeding    B12 deficiency 06/13/2015   Migraine with aura and with status migrainosus, not intractable 04/20/2015   Lung mass 04/20/2015   Generalized abdominal pain 01/24/2014   Osteopenia 01/20/2014   Nausea 01/29/2012   Seasonal and perennial allergic rhinitis 11/06/2011   Diverticulosis of large intestine 07/03/2011    Hyperlipidemia 12/13/2010   ONYCHOMYCOSIS, TOENAILS 06/09/2010   ANXIETY DEPRESSION 06/09/2010   s/p Procedure(s): XI ROBOT ASSISTED SIGMOIDECTOMY VS LOW ANTERIOR RESECTION, TAKEDOWN OF COLOVESICAL FISTULA FLEXIBLE SIGMOIDOSCOPY CYSTOSCOPY WITH STENT PLACEMENT LYSIS OF ADHESION 09/08/2021  -Doing great -Remove foley -Assuming she is voiding well on her own accord, plan discharge later today -Pain well controlled, tolerating diet, having spontaneous bowel fxn. Ambulating well. She is comfortable with and stable for discharge home today. We spent time reviewing postoperative expectations and long-term plans moving forward as well   LOS: 3 days   Nadeen Landau, MD [facility] [place]

## 2021-09-11 NOTE — Progress Notes (Signed)
PT Cancellation Note  Patient Details Name: Jamie [name] MRN: [id number] DOB: May 18, 1951   Cancelled Treatment:    Reason Eval/Treat Not Completed: Patient declined, no reason specified Pt politely declined.  Pt states she has been ambulating and will be discharging home today.    Myrtis Hopping [place] 09/11/2021, 11:24 AM Arlyce Dice, DPT Acute Rehabilitation Services Pager: [phone] Office: [phone]

## 2021-09-13 ENCOUNTER — Telehealth: Payer: Self-pay | Admitting: General Practice

## 2021-09-13 ENCOUNTER — Telehealth: Payer: Self-pay | Admitting: Physician Assistant

## 2021-09-13 ENCOUNTER — Ambulatory Visit: Payer: Medicare Other | Admitting: Physician Assistant

## 2021-09-13 NOTE — Telephone Encounter (Signed)
Received a vm that patient needed an appointment for Elevated Blood sugar and Heart Rate. Spoke with pt to offer the 3pm appt. Per patient she is unable to move in a lot of pain due to recent surgery. Per Shaune Pascal advise patient to call EMS if symptoms become worse since she is not able to come to the office. Patient verbal understood.  Per patient she would like a message to be sent to [facility]. Pt would like [name] to look over chart history to see what she has been dealing with.

## 2021-09-13 NOTE — Telephone Encounter (Signed)
Also received a call from Vicente Males, [place] with [facility], stating she spoke with patient and she informed her she has not been taking her Levothyroxine and Gabapentin.   Gabapentin was temporary for pain for shingles.   Levothyroxine - TSH last checked in July and was at 4.960 High  Instructions at that time: Are you taking synthroid 51mcg daily in the morning without anything to eat or other medications? If so we need to increase to 51mcg to get you closer to optimal levels.   Patient never responded to request on if she was taking this medication. Will reach out to patient to see when she stopped, but currently having difficulties from recent surgery, see other message.

## 2021-09-13 NOTE — Telephone Encounter (Signed)
Spoke with patient, she states not in a tremendous amount of pain, feels like normal post op pain, she is okay when she takes her pain medication given by Psychologist, sport and exercise.   She states blood sugar was between 209-298 last night but was given an insulin shot? Not really clear on who prescribed this and what kind of insulin/how much. She did check blood sugar this morning and it was 71. Heart rate has been between 80-90's. Does not sound like any acute symptoms.   She states has been off Levothyroxine for over a year, did not give a reason for stopping this medication.

## 2021-09-13 NOTE — Telephone Encounter (Signed)
Transition Care Management Follow-up Telephone Call Date of discharge and from where: 09/11/21 from [facility] How have you been since you were released from the hospital? Doing ok. Any questions or concerns? No  Items Reviewed: Did the pt receive and understand the discharge instructions provided? No  Medications obtained and verified? Yes  Other? No  Any new allergies since your discharge? No  Dietary orders reviewed? Yes Do you have support at home? Yes   Home Care and Equipment/Supplies: Were home health services ordered? no  Functional Questionnaire: (I = Independent and D = Dependent) ADLs: I  Bathing/Dressing- I  Meal Prep- I  Eating- I  Maintaining continence- I  Transferring/Ambulation- I  Managing Meds- I  Follow up appointments reviewed:  PCP Hospital f/u appt confirmed? Yes. Scheduled for 09/22/21 with [name], PA on 09/22/21 at 320 pm. [facility] f/u appt confirmed? Yes  She has been scheduled with Dr. [name] in two weeks. Are transportation arrangements needed? No  If their condition worsens, is the pt aware to call PCP or go to the Emergency Dept.? Yes Was the patient provided with contact information for the PCP's office or ED? Yes Was to pt encouraged to call back with questions or concerns? Yes

## 2021-09-13 NOTE — Telephone Encounter (Signed)
Mychart message sent with insulin sliding scale information per [place].

## 2021-09-18 ENCOUNTER — Encounter: Payer: Self-pay | Admitting: Physician Assistant

## 2021-09-22 ENCOUNTER — Telehealth (INDEPENDENT_AMBULATORY_CARE_PROVIDER_SITE_OTHER): Payer: Medicare Other | Admitting: Physician Assistant

## 2021-09-22 ENCOUNTER — Encounter: Payer: Self-pay | Admitting: Physician Assistant

## 2021-09-22 DIAGNOSIS — R8290 Unspecified abnormal findings in urine: Secondary | ICD-10-CM | POA: Diagnosis not present

## 2021-09-22 DIAGNOSIS — Z9889 Other specified postprocedural states: Secondary | ICD-10-CM | POA: Diagnosis not present

## 2021-09-22 DIAGNOSIS — R350 Frequency of micturition: Secondary | ICD-10-CM | POA: Diagnosis not present
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
## 2021-09-24 ENCOUNTER — Encounter: Payer: Self-pay | Admitting: Physician Assistant

## 2021-09-28 ENCOUNTER — Telehealth: Payer: Self-pay

## 2021-09-28 NOTE — Telephone Encounter (Signed)
Jamie [name] is wanting phenergan for nausea. Please advise.

## 2021-09-29 ENCOUNTER — Encounter: Payer: Self-pay | Admitting: Physician Assistant

## 2021-09-29 ENCOUNTER — Other Ambulatory Visit: Payer: Self-pay | Admitting: Physician Assistant

## 2021-09-29 MED ORDER — PROMETHAZINE HCL 25 MG PO TABS: 25.0000 mg | ORAL_TABLET | Freq: Four times a day (QID) | ORAL | 0 refills | Status: DC | PRN

## 2021-09-29 NOTE — Telephone Encounter (Signed)
Confirm is this still the nausea from pain after surgery? Ok for phenergan 25mg  q 4-6 hours #20 NRF

## 2021-09-29 NOTE — Telephone Encounter (Signed)
Task completed. Per patient, the medication was sent to the pharmacy earlier today. No other inquiries during the call.

## 2021-10-08 ENCOUNTER — Encounter: Payer: Self-pay | Admitting: Physician Assistant

## 2021-10-13 ENCOUNTER — Ambulatory Visit (INDEPENDENT_AMBULATORY_CARE_PROVIDER_SITE_OTHER): Payer: Medicare Other | Admitting: Physician Assistant

## 2021-10-13 DIAGNOSIS — Z1231 Encounter for screening mammogram for malignant neoplasm of breast: Secondary | ICD-10-CM | POA: Diagnosis not present

## 2021-10-13 DIAGNOSIS — Z0000 Encounter for general adult medical examination without abnormal findings: Secondary | ICD-10-CM | POA: Diagnosis not present

## 2021-10-13 NOTE — Progress Notes (Signed)
MEDICARE ANNUAL WELLNESS VISIT  10/13/2021  Telephone Visit Disclaimer This Medicare AWV was conducted by telephone due to national recommendations for restrictions regarding the COVID-19 Pandemic (e.g. social distancing).  I verified, using two identifiers, that I am speaking with Jamie [name] or their authorized healthcare agent. I discussed the limitations, risks, security, and privacy concerns of performing an evaluation and management service by telephone and the potential availability of an in-person appointment in the future. The patient expressed understanding and agreed to proceed.  Location of Patient: Home Location of Provider (nurse):  Provider home  Subjective:    Jamie [name] is a 71 y.o. female patient of [name], PA-C who had a Medicare Annual Wellness Visit today via telephone. Jamie [name] is Retired and lives with their daughter. she has 3 children. she reports that she is socially active and does interact with friends/family regularly. she is minimally physically active and enjoys shopping.  Patient Care Team: Lavada Mesi as PCP - General (Family Medicine)  Advanced Directives 10/13/2021 09/08/2021 08/29/2021 05/29/2021 05/26/2021 02/10/2021 09/12/2017  Does Patient Have a Medical Advance Directive? No No No No No No No  Would patient like information on creating a medical advance directive? No - Patient declined No - Patient declined - No - Patient declined No - Patient declined Yes (MAU/Ambulatory/Procedural Areas - Information given) -  Pre-existing out of facility DNR order (yellow form or pink MOST form) - - - - - - -    Hospital Utilization Over the Past 12 Months: # of hospitalizations or ER visits: 1 # of surgeries: 1  Review of Systems    Patient reports that her overall health is unchanged compared to last year.  History obtained from chart review and the patient  Patient Reported Readings (BP, Pulse, CBG, Weight, etc) none  Pain  Assessment Pain : 0-10 Pain Score: 4  Pain Type: Other (Comment) (surgical) Pain Location: Abdomen Pain Orientation: Lower Pain Descriptors / Indicators: Aching Pain Onset: More than a month ago Pain Frequency: Intermittent     Current Medications & Allergies (verified) Allergies as of 10/13/2021       Reactions   Dairycare [lactase-lactobacillus] Anaphylaxis   Iodine Rash   Ciprofloxacin    Facial numbness   Iodinated Contrast Media Other (See Comments)   Face and chest felt "hot", throat felt tight.    Peanut Butter Flavor Nausea And Vomiting   N/V   Shellfish Allergy Swelling        Medication List        Accurate as of October 13, 2021 11:26 AM. If you have any questions, ask your nurse or doctor.          Accu-Chek Guide Me w/Device Kit Dx R73.09 Elevated glucose - Check blood sugar 4 times daily.   acyclovir 200 MG capsule Commonly known as: Zovirax Take 4 capsules (800 mg total) by mouth 5 (five) times daily. For 7 days.   albuterol 108 (90 Base) MCG/ACT inhaler Commonly known as: Proventil HFA Inhale 2 puffs into the lungs every 6 (six) hours as needed for wheezing or shortness of breath.   ALPRAZolam 0.5 MG tablet Commonly known as: XANAX TAKE 1 TABLET BY MOUTH UP TO TWICE DAILY AS NEEDED What changed:  how much to take how to take this when to take this reasons to take this additional instructions   b complex vitamins capsule Take 1 capsule by mouth daily.   blood glucose meter kit and  supplies Dispense based on patient and insurance preference. Use up to four times daily as directed. 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   cyclobenzaprine 10 MG tablet Commonly known as: FLEXERIL Take 1 tablet (10 mg total) by mouth 2 (two) times daily as needed for muscle spasms.   diclofenac 75 MG EC tablet Commonly known as: VOLTAREN TAKE 1 TABLET(75 MG) BY MOUTH TWICE  DAILY   EPINEPHrine 0.3 mg/0.3 mL Soaj injection Commonly known as: EPI-PEN INJECT 0.3 ML IN THE MUSCLE AS NEEDED FOR ANAPHYLAXIS   Foltanx 3-35-2 MG Tabs Take 1 tablet by mouth in the morning and at bedtime.   gabapentin 300 MG capsule Commonly known as: NEURONTIN TAKE 1 CAPSULE(300 MG) BY MOUTH THREE TIMES DAILY What changed:  how much to take how to take this when to take this reasons to take this additional instructions   ibuprofen 800 MG tablet Commonly known as: ADVIL Take 1 tablet (800 mg total) by mouth every 8 (eight) hours as needed.   Lancets 30G Misc Dx R73.09 Elevated glucose - Check blood sugar 4 times daily.   levothyroxine 50 MCG tablet Commonly known as: SYNTHROID TAKE 1 TABLET(50 MCG) BY MOUTH DAILY   lidocaine 5 % ointment Commonly known as: XYLOCAINE Apply 1 application topically daily.   lidocaine 5 % Commonly known as: Lidoderm Place 1 patch onto the skin daily. Remove & Discard patch within 12 hours or as directed by MD   lubiprostone 8 MCG capsule Commonly known as: Amitiza Take 1 capsule (8 mcg total) by mouth 2 (two) times daily with a meal.   meclizine 25 MG tablet Commonly known as: ANTIVERT Take 1 tablet (25 mg total) by mouth 3 (three) times daily as needed for dizziness.   omeprazole 40 MG capsule Commonly known as: PRILOSEC TAKE 1 CAPSULE(40 MG) BY MOUTH DAILY What changed: See the new instructions.   OVER THE COUNTER MEDICATION Take 2 capsules by mouth daily. Friendly four   oxyCODONE-acetaminophen 5-325 MG tablet Commonly known as: PERCOCET/ROXICET Take 1 tablet by mouth every 6 (six) hours as needed for up to 8 doses for severe pain.   pantoprazole 40 MG tablet Commonly known as: PROTONIX Take 40 mg by mouth in the morning and at bedtime. As needed   Pepcid 20 MG tablet Generic drug: famotidine Take 1 tablet (20 mg total) by mouth 2 (two) times daily. BRAND ONLY. What changed:  when to take this reasons to take  this   Premarin vaginal cream Generic drug: conjugated estrogens APPLY VAGINALLY 3 TIMES A WEEK AT NIGHT   promethazine 25 MG tablet Commonly known as: PHENERGAN Take 1 tablet (25 mg total) by mouth every 6 (six) hours as needed for nausea or vomiting.   traMADol 50 MG tablet Commonly known as: ULTRAM Take 1 tablet (50 mg total) by mouth every 6 (six) hours as needed (postop pain not controlled with ibuprofen first).   vitamin B-12 1000 MCG tablet Commonly known as: CYANOCOBALAMIN Take 1,000 mcg by mouth daily.        History (reviewed): Past Medical History:  Diagnosis Date   Allergy    Anemia    Anxiety    Cataract    bilateral   Depression    Diverticulitis    Diverticulosis    Dyspnea    GERD (gastroesophageal reflux disease)    Hives of unknown origin 10/12/2019   Hyperlipidemia  Hypothyroidism    IBS (irritable bowel syndrome)    Lung mass    Pneumonia    Pre-diabetes    Syncope 03/08/2015   Thyroid disease    Past Surgical History:  Procedure Laterality Date   ABDOMINAL HYSTERECTOMY     COLONOSCOPY     CYSTOSCOPY WITH STENT PLACEMENT Bilateral 09/08/2021   Procedure: CYSTOSCOPY WITH STENT PLACEMENT;  Surgeon: Irine Seal, MD;  Location: [facility];  Service: Urology;  Laterality: Bilateral;   FLEXIBLE SIGMOIDOSCOPY N/A 09/08/2021   Procedure: FLEXIBLE SIGMOIDOSCOPY;  Surgeon: Ileana Roup, MD;  Location: [facility];  Service: General;  Laterality: N/A;   HEMORRHOID SURGERY     LYSIS OF ADHESION N/A 09/08/2021   Procedure: LYSIS OF ADHESION;  Surgeon: Ileana Roup, MD;  Location: [facility];  Service: General;  Laterality: N/A;   TUBAL LIGATION     TUMOR REMOVAL     Left thigh    Family History  Problem Relation Age of Onset   Diabetes Mother    Liver cancer Mother        renal cell    Heart disease Father        first MI in 20s   Cervical cancer Sister    Allergic rhinitis Sister    Asthma Sister    Heart disease Brother        first MI  in 48s   Alcohol abuse Sister    Fibromyalgia Sister    Pancreatic cancer Sister 70   Arthritis Sister        osteoarthritis   Emphysema Sister    Irritable bowel syndrome Sister    Emphysema Sister    Leukemia Other        grandson   Urticaria Daughter    Allergic rhinitis Daughter    Stomach cancer Neg Hx    Colon cancer Neg Hx    Angioedema Neg Hx    Eczema Neg Hx    Immunodeficiency Neg Hx    Colon polyps Neg Hx    Esophageal cancer Neg Hx    Rectal cancer Neg Hx    Social History   Socioeconomic History   Marital status: Single    Spouse name: Not on file   Number of children: 3   Years of education: 14   Highest education level: Associate degree: academic program  Occupational History   Occupation: Ship broker   Occupation: retired.  Tobacco Use   Smoking status: Former    Packs/day: 0.80    Years: 20.00    Pack years: 16.00    Types: Cigarettes    Quit date: 08/13/1985    Years since quitting: 36.1   Smokeless tobacco: Never  Vaping Use   Vaping Use: Never used  Substance and Sexual Activity   Alcohol use: Yes    Comment: rare   Drug use: No   Sexual activity: Not Currently    Partners: Male    Birth control/protection: None  Other Topics Concern   Not on file  Social History Narrative   Her daughter is staying with her since her surgery. She enjoys shopping, exercise (when she can) and enjoys word finder.    Social Determinants of Health   Financial Resource Strain: Low Risk    Difficulty of Paying Living Expenses: Not hard at all  Food Insecurity: No Food Insecurity   Worried About Charity fundraiser in the Last Year: Never true   [place] in the Last Year: Never  true  Transportation Needs: No Transportation Needs   Lack of Transportation (Medical): No   Lack of Transportation (Non-Medical): No  Physical Activity: Inactive   Days of Exercise per Week: 0 days   Minutes of Exercise per Session: 0 min  Stress: No Stress Concern Present    Feeling of Stress : Not at all  Social Connections: Moderately Isolated   Frequency of Communication with Friends and Family: More than three times a week   Frequency of Social Gatherings with Friends and Family: Once a week   Attends Religious Services: More than 4 times per year   Active Member of [employer] or Organizations: No   Attends Archivist Meetings: Never   Marital Status: Divorced    Activities of Daily Living In your present state of health, do you have any difficulty performing the following activities: 10/13/2021 09/08/2021  Hearing? N Y  Vision? N Y  Difficulty concentrating or making decisions? N Y  Walking or climbing stairs? N N  Dressing or bathing? N N  Doing errands, shopping? N N  Preparing Food and eating ? N -  Using the Toilet? N -  In the past six months, have you accidently leaked urine? N -  Do you have problems with loss of bowel control? N -  Managing your Medications? N -  Managing your Finances? N -  Housekeeping or managing your Housekeeping? N -  Some recent data might be hidden    Patient Education/ Literacy How often do you need to have someone help you when you read instructions, pamphlets, or other written materials from your doctor or pharmacy?: 1 - Never What is the last grade level you completed in school?: associates degree  Exercise Current Exercise Habits: The patient does not participate in regular exercise at present, Exercise limited by: Other - see comments (due to recent surgery)  Diet Patient reports consuming 3 meals a day and 1 snack(s) a day Patient reports that her primary diet is: Regular Patient reports that she does have regular access to food.   Depression Screen PHQ 2/9 Scores 10/13/2021 02/16/2021 02/02/2020 02/02/2020 11/19/2018 09/30/2018 09/10/2018  PHQ - 2 Score 0 2 0 '3 6 3 2  ' PHQ- 9 Score - '7 1 5 18 12 6     ' Fall Risk Fall Risk  10/13/2021 02/10/2021  Falls in the past year? 0 0  Number falls in past yr: 0 0   Injury with Fall? 0 0  Risk for fall due to : No Fall Risks No Fall Risks  Follow up Falls evaluation completed Falls prevention discussed;Falls evaluation completed     Objective:  Jamie [name] seemed alert and oriented and she participated appropriately during our telephone visit.  Blood Pressure Weight BMI  BP Readings from Last 3 Encounters:  09/11/21 113/68  08/29/21 128/73  06/21/21 115/61   Wt Readings from Last 3 Encounters:  09/11/21 139 lb 15.9 oz (63.5 kg)  08/29/21 136 lb (61.7 kg)  06/21/21 135 lb (61.2 kg)   BMI Readings from Last 1 Encounters:  09/11/21 23.30 kg/m    *Unable to obtain current vital signs, weight, and BMI due to telephone visit type  Hearing/Vision  Jamie [name] did not seem to have difficulty with hearing/understanding during the telephone conversation Reports that she has had a formal eye exam by an eye care professional within the past year Reports that she has not had a formal hearing evaluation within the past year *Unable to fully assess hearing  and vision during telephone visit type  Cognitive Function: 6CIT Screen 10/13/2021  What Year? 0 points  What month? 0 points  What time? 0 points  Count back from 20 0 points  Months in reverse 0 points  Repeat phrase 0 points  Total Score 0   (Normal:0-7, Significant for Dysfunction: >8)  Normal Cognitive Function Screening: Yes   Immunization & Health Maintenance Record Immunization History  Administered Date(s) Administered   Td 08/13/2005    Health Maintenance  Topic Date Due   INFLUENZA VACCINE  11/10/2021 (Originally 03/13/2021)   Zoster Vaccines- Shingrix (1 of 2) 01/13/2022 (Originally 12/20/1969)   TETANUS/TDAP  02/10/2022 (Originally 08/14/2015)   Pneumonia Vaccine 77+ Years old (1 - PCV) 06/21/2022 (Originally 12/19/2017)   MAMMOGRAM  04/12/2022   DEXA SCAN  12/15/2022   COLONOSCOPY (Pts 45-70yr Insurance coverage will need to be confirmed)  03/21/2027   Hepatitis C Screening   Completed   HPV VACCINES  Aged Out   COVID-19 Vaccine  Discontinued       Assessment  This is a routine wellness examination for MDeboraha [name]  Health Maintenance: Due or Overdue There are no preventive care reminders to display for this patient.   MDeboraha Sprangdoes not need a referral for Community Assistance: Care Management:   no Social Work:    no Prescription Assistance:  no Nutrition/Diabetes Education:  no   Plan:  Personalized Goals  Goals Addressed               This Visit's Progress     Patient Stated (pt-stated)        10/13/2021 AWV Goal: Exercise for General Health  Patient will verbalize understanding of the benefits of increased physical activity: Exercising regularly is important. It will improve your overall fitness, flexibility, and endurance. Regular exercise also will improve your overall health. It can help you control your weight, reduce stress, and improve your bone density. Over the next year, patient will increase physical activity as tolerated with a goal of at least 150 minutes of moderate physical activity per week.  You can tell that you are exercising at a moderate intensity if your heart starts beating faster and you start breathing faster but can still hold a conversation. Moderate-intensity exercise ideas include: Walking 1 mile (1.6 km) in about 15 minutes Biking Hiking Golfing Dancing Water aerobics Patient will verbalize understanding of everyday activities that increase physical activity by providing examples like the following: Yard work, such as: PSales promotion account executiveGardening Washing windows or floors Patient will be able to explain general safety guidelines for exercising:  Before you start a new exercise program, talk with your health care provider. Do not exercise so much that you hurt yourself, feel dizzy, or get very short of breath. Wear  comfortable clothes and wear shoes with good support. Drink plenty of water while you exercise to prevent dehydration or heat stroke. Work out until your breathing and your heartbeat get faster.        Personalized Health Maintenance & Screening Recommendations  Screening mammography Influenza vaccine Shingles vaccine Pneumonia vaccine Tetanus vaccine  Patient declined the immunizations at this time.  Lung Cancer Screening Recommended: no (Low Dose CT Chest recommended if Age 69-80years, 30 pack-year currently smoking OR have quit w/in past 15 years) Hepatitis C Screening recommended: no HIV Screening recommended: no  Advanced Directives: Written information was not prepared per  patient's request.  Referrals & Orders Orders Placed This Encounter  Procedures   Mammogram 3D SCREEN BREAST BILATERAL    Follow-up Plan Follow-up with [name], PA-C as planned Mammogram referral sent and they will call you to schedule. Medicare wellness visit in one year AVS printed and mailed to the patient.   I have personally reviewed and noted the following in the patients chart:   Medical and social history Use of alcohol, tobacco or illicit drugs  Current medications and supplements Functional ability and status Nutritional status Physical activity Advanced directives List of other physicians Hospitalizations, surgeries, and ER visits in previous 12 months Vitals Screenings to include cognitive, depression, and falls Referrals and appointments  In addition, I have reviewed and discussed with Jamie [name] certain preventive protocols, quality metrics, and best practice recommendations. A written personalized care plan for preventive services as well as general preventive health recommendations is available and can be mailed to the patient at her request.      Tinnie Gens, RN  10/13/2021

## 2021-10-13 NOTE — Patient Instructions (Signed)
[facility] Maintenance Summary and Written Plan of Care  Ms. Jamie [name] ,  Thank you for allowing me to perform your Medicare Annual Wellness Visit and for your ongoing commitment to your health.   Health Maintenance & Immunization History Health Maintenance  Topic Date Due   INFLUENZA VACCINE  11/10/2021 (Originally 03/13/2021)   Zoster Vaccines- Shingrix (1 of 2) 01/13/2022 (Originally 12/20/1969)   TETANUS/TDAP  02/10/2022 (Originally 08/14/2015)   Pneumonia Vaccine 52+ Years old (1 - PCV) 06/21/2022 (Originally 12/19/2017)   MAMMOGRAM  04/12/2022   DEXA SCAN  12/15/2022   COLONOSCOPY (Pts 45-17yrs Insurance coverage will need to be confirmed)  03/21/2027   Hepatitis C Screening  Completed   HPV VACCINES  Aged Out   COVID-19 Vaccine  Discontinued   Immunization History  Administered Date(s) Administered   Td 08/13/2005    These are the patient goals that we discussed:  Goals Addressed               This Visit's Progress     Patient Stated (pt-stated)        10/13/2021 AWV Goal: Exercise for General Health  Patient will verbalize understanding of the benefits of increased physical activity: Exercising regularly is important. It will improve your overall fitness, flexibility, and endurance. Regular exercise also will improve your overall health. It can help you control your weight, reduce stress, and improve your bone density. Over the next year, patient will increase physical activity as tolerated with a goal of at least 150 minutes of moderate physical activity per week.  You can tell that you are exercising at a moderate intensity if your heart starts beating faster and you start breathing faster but can still hold a conversation. Moderate-intensity exercise ideas include: Walking 1 mile (1.6 km) in about 15 minutes Biking Hiking Golfing Dancing Water aerobics Patient will verbalize understanding of everyday activities that increase physical  activity by providing examples like the following: Yard work, such as: Sales promotion account executive Gardening Washing windows or floors Patient will be able to explain general safety guidelines for exercising:  Before you start a new exercise program, talk with your health care provider. Do not exercise so much that you hurt yourself, feel dizzy, or get very short of breath. Wear comfortable clothes and wear shoes with good support. Drink plenty of water while you exercise to prevent dehydration or heat stroke. Work out until your breathing and your heartbeat get faster.          This is a list of Health Maintenance Items that are overdue or due now: Screening mammography    Orders/Referrals Placed Today: Orders Placed This Encounter  Procedures   Mammogram 3D SCREEN BREAST BILATERAL    Standing Status:   Future    Standing Expiration Date:   10/14/2022    Scheduling Instructions:     Please call patient to schedule.    Order Specific Question:   Reason for Exam (SYMPTOM  OR DIAGNOSIS REQUIRED)    Answer:   Breast cancer screening    Order Specific Question:   Preferred imaging location?    Answer:   [name]   (Contact our referral department at [phone] if you have not spoken with someone about your referral appointment within the next 5 days)    Follow-up Plan Follow-up with [name], PA-C as planned Mammogram referral sent and they will call you to  schedule. Medicare wellness visit in one year AVS printed and mailed to the patient.     Health Maintenance, Female Adopting a healthy lifestyle and getting preventive care are important in promoting health and wellness. Ask your health care provider about: The right schedule for you to have regular tests and exams. Things you can do on your own to prevent diseases and keep yourself healthy. What should I know about diet,  weight, and exercise? Eat a healthy diet  Eat a diet that includes plenty of vegetables, fruits, low-fat dairy products, and lean protein. Do not eat a lot of foods that are high in solid fats, added sugars, or sodium. Maintain a healthy weight Body mass index (BMI) is used to identify weight problems. It estimates body fat based on height and weight. Your health care provider can help determine your BMI and help you achieve or maintain a healthy weight. Get regular exercise Get regular exercise. This is one of the most important things you can do for your health. Most adults should: Exercise for at least 150 minutes each week. The exercise should increase your heart rate and make you sweat (moderate-intensity exercise). Do strengthening exercises at least twice a week. This is in addition to the moderate-intensity exercise. Spend less time sitting. Even light physical activity can be beneficial. Watch cholesterol and blood lipids Have your blood tested for lipids and cholesterol at 69 years of age, then have this test every 5 years. Have your cholesterol levels checked more often if: Your lipid or cholesterol levels are high. You are older than 69 years of age. You are at high risk for heart disease. What should I know about cancer screening? Depending on your health history and family history, you may need to have cancer screening at various ages. This may include screening for: Breast cancer. Cervical cancer. Colorectal cancer. Skin cancer. Lung cancer. What should I know about heart disease, diabetes, and high blood pressure? Blood pressure and heart disease High blood pressure causes heart disease and increases the risk of stroke. This is more likely to develop in people who have high blood pressure readings or are overweight. Have your blood pressure checked: Every 3-5 years if you are 45-63 years of age. Every year if you are 65 years old or older. Diabetes Have regular  diabetes screenings. This checks your fasting blood sugar level. Have the screening done: Once every three years after age 26 if you are at a normal weight and have a low risk for diabetes. More often and at a younger age if you are overweight or have a high risk for diabetes. What should I know about preventing infection? Hepatitis B If you have a higher risk for hepatitis B, you should be screened for this virus. Talk with your health care provider to find out if you are at risk for hepatitis B infection. Hepatitis C Testing is recommended for: Everyone born from [birth year] through 1963. Anyone with known risk factors for hepatitis C. Sexually transmitted infections (STIs) Get screened for STIs, including gonorrhea and chlamydia, if: You are sexually active and are younger than 69 years of age. You are older than 69 years of age and your health care provider tells you that you are at risk for this type of infection. Your sexual activity has changed since you were last screened, and you are at increased risk for chlamydia or gonorrhea. Ask your health care provider if you are at risk. Ask your health care provider about whether you  are at high risk for HIV. Your health care provider may recommend a prescription medicine to help prevent HIV infection. If you choose to take medicine to prevent HIV, you should first get tested for HIV. You should then be tested every 3 months for as long as you are taking the medicine. Pregnancy If you are about to stop having your period (premenopausal) and you may become pregnant, seek counseling before you get pregnant. Take 400 to 800 micrograms (mcg) of folic acid every day if you become pregnant. Ask for birth control (contraception) if you want to prevent pregnancy. Osteoporosis and menopause Osteoporosis is a disease in which the bones lose minerals and strength with aging. This can result in bone fractures. If you are 54 years old or older, or if you are at  risk for osteoporosis and fractures, ask your health care provider if you should: Be screened for bone loss. Take a calcium or vitamin D supplement to lower your risk of fractures. Be given hormone replacement therapy (HRT) to treat symptoms of menopause. Follow these instructions at home: Alcohol use Do not drink alcohol if: Your health care provider tells you not to drink. You are pregnant, may be pregnant, or are planning to become pregnant. If you drink alcohol: Limit how much you have to: 0-1 drink a day. Know how much alcohol is in your drink. In the [place] one drink equals one 12 oz bottle of beer (355 mL), one 5 oz glass of wine (148 mL), or one 1 oz glass of hard liquor (44 mL). Lifestyle Do not use any products that contain nicotine or tobacco. These products include cigarettes, chewing tobacco, and vaping devices, such as e-cigarettes. If you need help quitting, ask your health care provider. Do not use street drugs. Do not share needles. Ask your health care provider for help if you need support or information about quitting drugs. General instructions Schedule regular health, dental, and eye exams. Stay current with your vaccines. Tell your health care provider if: You often feel depressed. You have ever been abused or do not feel safe at home. Summary Adopting a healthy lifestyle and getting preventive care are important in promoting health and wellness. Follow your health care provider's instructions about healthy diet, exercising, and getting tested or screened for diseases. Follow your health care provider's instructions on monitoring your cholesterol and blood pressure. This information is not intended to replace advice given to you by your health care provider. Make sure you discuss any questions you have with your health care provider. Document Revised: 12/19/2020 Document Reviewed: 12/19/2020 Elsevier Patient Education  [place].

## 2021-10-14 ENCOUNTER — Other Ambulatory Visit: Payer: Self-pay

## 2021-10-14 ENCOUNTER — Emergency Department (HOSPITAL_BASED_OUTPATIENT_CLINIC_OR_DEPARTMENT_OTHER)
Admission: EM | Admit: 2021-10-14 | Discharge: 2021-10-14 | Disposition: A | Discharge status: Home / Self Care | Payer: Medicare Other | Attending: Emergency Medicine | Admitting: Emergency Medicine

## 2021-10-14 ENCOUNTER — Encounter (HOSPITAL_BASED_OUTPATIENT_CLINIC_OR_DEPARTMENT_OTHER): Payer: Self-pay | Admitting: *Deleted

## 2021-10-14 DIAGNOSIS — F419 Anxiety disorder, unspecified: Secondary | ICD-10-CM | POA: Diagnosis not present

## 2021-10-14 DIAGNOSIS — R000 Tachycardia, unspecified: Secondary | ICD-10-CM | POA: Diagnosis not present

## 2021-10-14 DIAGNOSIS — E039 Hypothyroidism, unspecified: Secondary | ICD-10-CM | POA: Insufficient documentation

## 2021-10-14 DIAGNOSIS — T782XXA Anaphylactic shock, unspecified, initial encounter: Secondary | ICD-10-CM

## 2021-10-14 DIAGNOSIS — T7840XA Allergy, unspecified, initial encounter: Secondary | ICD-10-CM | POA: Diagnosis present

## 2021-10-14 DIAGNOSIS — T886XXA Anaphylactic reaction due to adverse effect of correct drug or medicament properly administered, initial encounter: Secondary | ICD-10-CM | POA: Insufficient documentation

## 2021-10-14 MED ORDER — EPINEPHRINE 0.3 MG/0.3ML IJ SOAJ: 0.3000 mg | INTRAMUSCULAR | 0 refills | Status: DC | PRN

## 2021-10-14 MED ORDER — PREDNISONE 20 MG PO TABS: ORAL_TABLET | ORAL | 0 refills | Status: DC

## 2021-10-14 MED ORDER — SODIUM CHLORIDE 0.9 % BOLUS PEDS: 500.0000 mL | Freq: Once | INTRAVENOUS | Status: DC

## 2021-10-14 MED ORDER — METHYLPREDNISOLONE SODIUM SUCC 125 MG IJ SOLR
125.0000 mg | Freq: Once | INTRAMUSCULAR | Status: AC
  Administered 2021-10-14: 125 mg via INTRAVENOUS

## 2021-10-14 MED ORDER — FAMOTIDINE IN NACL 20-0.9 MG/50ML-% IV SOLN: 20.0000 mg | Freq: Once | INTRAVENOUS | Status: AC

## 2021-10-14 MED ORDER — EPINEPHRINE 0.3 MG/0.3ML IJ SOAJ
INTRAMUSCULAR | Status: AC
  Filled 2021-10-14: qty 0.3

## 2021-10-14 MED ORDER — METHYLPREDNISOLONE SODIUM SUCC 125 MG IJ SOLR
INTRAMUSCULAR | Status: AC
  Filled 2021-10-14: qty 2

## 2021-10-14 MED ORDER — DIPHENHYDRAMINE HCL 50 MG/ML IJ SOLN
INTRAMUSCULAR | Status: AC
  Administered 2021-10-14: 25 mg via INTRAVENOUS
  Filled 2021-10-14: qty 1

## 2021-10-14 MED ORDER — EPINEPHRINE 0.3 MG/0.3ML IJ SOAJ
0.3000 mg | Freq: Once | INTRAMUSCULAR | Status: AC
  Administered 2021-10-14: 0.3 mg via INTRAMUSCULAR

## 2021-10-14 MED ORDER — PREDNISONE 10 MG PO TABS: 20.0000 mg | ORAL_TABLET | Freq: Every day | ORAL | 0 refills | Status: DC

## 2021-10-14 MED ORDER — DIPHENHYDRAMINE HCL 50 MG/ML IJ SOLN: 25.0000 mg | Freq: Once | INTRAMUSCULAR | Status: AC

## 2021-10-14 MED ORDER — FAMOTIDINE IN NACL 20-0.9 MG/50ML-% IV SOLN
INTRAVENOUS | Status: AC
  Administered 2021-10-14: 20 mg via INTRAVENOUS
  Filled 2021-10-14: qty 50

## 2021-10-14 MED ORDER — SODIUM CHLORIDE 0.9 % IV BOLUS
500.0000 mL | Freq: Once | INTRAVENOUS | Status: AC
  Administered 2021-10-14: 500 mL via INTRAVENOUS

## 2021-10-14 NOTE — ED Provider Notes (Signed)
[facility] Provider Note   CSN: [id number] Arrival date & time: 10/14/21  1405     History  Chief Complaint  Patient presents with   Allergic Reaction    Jamie [name] is a 71 y.o. female.  HPI     71 y.o. female who underwent robotic low anterior resection with double stapled colorectal anastomosis, takedown of colovesical fistula, lysis adhesion 09/08/2021, was on Bactrim for 10 days staring 2/13-2/23 (chronically colonized bladder), saw Urology yesterday and had UA done and culture and was given rx for keflex but did not take it due to fear of side effects and took bactrim instead this AM, after that started to feel hot at pruritis, then took an enema and after that develop more severe full body rash, sensation of tongue swelling.     Started the bactrim again this AM for the first time since then  Felt hot and burning over skin then took enema, after that developed diffuse rash, itching, throat swelling, tongue swelling  Urinary symptoms this morning.  Before was having foul smell and symptoms  Today having urinary symptoms, pressure, frequency  No fevers, no nausea vomiting, abdominal pain resolved/improved   Some dyspnea  Urologist sent culture yesterday Cephalexin by urologist, read side effects and was too afraid to take it and switched to bactrim   Past Medical History:  Diagnosis Date   Allergy    Anemia    Anxiety    Cataract    bilateral   Depression    Diverticulitis    Diverticulosis    Dyspnea    GERD (gastroesophageal reflux disease)    Hives of unknown origin 10/12/2019   Hyperlipidemia    Hypothyroidism    IBS (irritable bowel syndrome)    Lung mass    Pneumonia    Pre-diabetes    Syncope 03/08/2015   Thyroid disease    Past Surgical History:  Procedure Laterality Date   ABDOMINAL HYSTERECTOMY     COLONOSCOPY     CYSTOSCOPY WITH STENT PLACEMENT Bilateral 09/08/2021   Procedure: CYSTOSCOPY WITH STENT  PLACEMENT;  Surgeon: Irine Seal, MD;  Location: [facility];  Service: Urology;  Laterality: Bilateral;   FLEXIBLE SIGMOIDOSCOPY N/A 09/08/2021   Procedure: FLEXIBLE SIGMOIDOSCOPY;  Surgeon: Ileana Roup, MD;  Location: [facility];  Service: General;  Laterality: N/A;   HEMORRHOID SURGERY     LYSIS OF ADHESION N/A 09/08/2021   Procedure: LYSIS OF ADHESION;  Surgeon: Ileana Roup, MD;  Location: [facility];  Service: General;  Laterality: N/A;   TUBAL LIGATION     TUMOR REMOVAL     Left thigh     Home Medications Prior to Admission medications   Medication Sig Start Date End Date Taking? Authorizing Provider  EPINEPHrine 0.3 mg/0.3 mL IJ SOAJ injection Inject 0.3 mg into the muscle as needed for anaphylaxis. 10/14/21  Yes Gareth Morgan, MD  EPINEPHrine 0.3 mg/0.3 mL IJ SOAJ injection Inject 0.3 mg into the muscle as needed for anaphylaxis. 10/14/21  Yes Deno Etienne, DO  predniSONE (DELTASONE) 20 MG tablet 2 tabs po daily x 4 days 10/14/21  Yes Deno Etienne, DO  acyclovir (ZOVIRAX) 200 MG capsule Take 4 capsules (800 mg total) by mouth 5 (five) times daily. For 7 days. 08/15/21   Breeback, [name] L, PA-C  albuterol (PROVENTIL HFA) 108 (90 Base) MCG/ACT inhaler Inhale 2 puffs into the lungs every 6 (six) hours as needed for wheezing or shortness of breath. Patient not taking:  Reported on 08/21/2021 02/03/21   [name], PA-C  ALPRAZolam (XANAX) 0.5 MG tablet TAKE 1 TABLET BY MOUTH UP TO TWICE DAILY AS NEEDED Patient taking differently: Take 0.5 mg by mouth at bedtime as needed for sleep. 05/30/21   Breeback, [name] L, PA-C  b complex vitamins capsule Take 1 capsule by mouth daily.    [provider]  blood glucose meter kit and supplies Dispense based on patient and insurance preference. Use up to four times daily as directed. 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 08/31/20   Breeback, Royetta Car, PA-C  cholecalciferol (VITAMIN D3) 25 MCG (1000 UNIT) tablet Take 1,000 Units by mouth daily.    [provider]  conjugated estrogens (PREMARIN) vaginal cream APPLY VAGINALLY 3 TIMES A WEEK AT NIGHT Patient not taking: Reported on 08/21/2021 02/16/20   [name], PA-C  cyclobenzaprine (FLEXERIL) 10 MG tablet Take 1 tablet (10 mg total) by mouth 2 (two) times daily as needed for muscle spasms. Patient not taking: Reported on 08/21/2021 05/26/21   Gareth Morgan, MD  diclofenac (VOLTAREN) 75 MG EC tablet TAKE 1 TABLET(75 MG) BY MOUTH TWICE DAILY Patient not taking: Reported on 08/21/2021 07/24/21   [name] L, PA-C  EPINEPHRINE 0.3 mg/0.3 mL IJ SOAJ injection INJECT 0.3 ML IN THE MUSCLE AS NEEDED FOR ANAPHYLAXIS 01/10/21   Ambs, Kathrine Cords, FNP  gabapentin (NEURONTIN) 300 MG capsule TAKE 1 CAPSULE(300 MG) BY MOUTH THREE TIMES DAILY Patient taking differently: Take 300 mg by mouth at bedtime as needed (pain). 07/31/21   Breeback, [name] L, PA-C  ibuprofen (ADVIL) 800 MG tablet Take 1 tablet (800 mg total) by mouth every 8 (eight) hours as needed. 06/02/21   Breeback, [name] L, PA-C  L-Methylfolate-B6-B12 (FOLTANX) 3-35-2 MG TABS Take 1 tablet by mouth in the morning and at bedtime. 02/02/20   Breeback, [name] L, PA-C  Lancets 30G MISC Dx R73.09 Elevated glucose - Check blood sugar 4 times daily. 08/31/20   Breeback, Royetta Car, PA-C  levothyroxine (SYNTHROID) 50 MCG tablet TAKE 1 TABLET(50 MCG) BY MOUTH DAILY Patient not taking: Reported on 08/21/2021 04/27/20   [name] L, PA-C  lidocaine (LIDODERM) 5 % Place 1 patch onto the skin daily. Remove & Discard patch within 12 hours or as directed by MD Patient not taking: Reported on 08/21/2021 06/02/21   [name] L, PA-C  lidocaine (XYLOCAINE) 5 % ointment Apply 1 application topically daily. Patient not taking: Reported on 08/21/2021 06/02/21   [name], PA-C  lubiprostone (AMITIZA) 8 MCG capsule Take 1  capsule (8 mcg total) by mouth 2 (two) times daily with a meal. Patient not taking: Reported on 08/21/2021 09/21/19   [name], PA-C  meclizine (ANTIVERT) 25 MG tablet Take 1 tablet (25 mg total) by mouth 3 (three) times daily as needed for dizziness. Patient not taking: Reported on 08/21/2021 08/17/20   [name], PA-C  omeprazole (PRILOSEC) 40 MG capsule TAKE 1 CAPSULE(40 MG) BY MOUTH DAILY Patient taking differently: Take 40 mg by mouth daily as needed (acid reflux). 04/27/20   Breeback, [name] L, PA-C  OVER THE COUNTER MEDICATION Take 2 capsules by mouth daily. Friendly four    [provider]  oxyCODONE-acetaminophen (PERCOCET/ROXICET) 5-325 MG tablet Take 1 tablet by mouth every 6 (six) hours as needed for up to 8 doses  for severe pain. Patient not taking: Reported on 08/21/2021 06/21/21   [name], PA-C  pantoprazole (PROTONIX) 40 MG tablet Take 40 mg by mouth in the morning and at bedtime. As needed    [provider]  PEPCID 20 MG tablet Take 1 tablet (20 mg total) by mouth 2 (two) times daily. BRAND ONLY. Patient taking differently: Take 20 mg by mouth 2 (two) times daily as needed for heartburn or indigestion. BRAND ONLY. 08/15/21   [name], PA-C  promethazine (PHENERGAN) 25 MG tablet Take 1 tablet (25 mg total) by mouth every 6 (six) hours as needed for nausea or vomiting. 09/29/21   Breeback, Royetta Car, PA-C  traMADol (ULTRAM) 50 MG tablet Take 1 tablet (50 mg total) by mouth every 6 (six) hours as needed (postop pain not controlled with ibuprofen first). 09/08/21   Ileana Roup, MD  vitamin B-12 (CYANOCOBALAMIN) 1000 MCG tablet Take 1,000 mcg by mouth daily.    [provider]      Allergies    Bactrim [sulfamethoxazole-trimethoprim], Dairycare [lactase-lactobacillus], Iodine, Ciprofloxacin, Iodinated contrast media, Peanut butter flavor, and Shellfish allergy    Review of Systems   Review of Systems See above Physical Exam Updated  Vital Signs BP 132/65    Pulse 91    Temp 98.7 F (37.1 C)    Resp 20    Ht '5\' 5"'  (1.651 m)    Wt 63.5 kg    SpO2 97%    BMI 23.30 kg/m  Physical Exam Vitals and nursing note reviewed.  Constitutional:      General: She is in acute distress (anxious).     Appearance: She is well-developed. She is not diaphoretic.  HENT:     Head: Normocephalic and atraumatic.  Eyes:     Conjunctiva/sclera: Conjunctivae normal.  Cardiovascular:     Rate and Rhythm: Regular rhythm. Tachycardia present.     Heart sounds: Normal heart sounds. No murmur heard.   No friction rub. No gallop.  Pulmonary:     Effort: Pulmonary effort is normal. No respiratory distress.     Breath sounds: Normal breath sounds. No wheezing or rales.  Abdominal:     General: There is no distension.     Palpations: Abdomen is soft.     Tenderness: There is no abdominal tenderness. There is no guarding.  Musculoskeletal:        General: No tenderness.     Cervical back: Normal range of motion.  Skin:    General: Skin is warm and dry.     Findings: Erythema and rash present.  Neurological:     Mental Status: She is alert and oriented to person, place, and time.    ED Results / Procedures / Treatments   Labs (all labs ordered are listed, but only abnormal results are displayed) Labs Reviewed - No data to display  EKG None  Radiology No results found.  Procedures Procedures    Medications Ordered in ED Medications  diphenhydrAMINE (BENADRYL) injection 25 mg (25 mg Intravenous Given 10/14/21 1417)  EPINEPHrine (EPI-PEN) injection 0.3 mg (0.3 mg Intramuscular Given 10/14/21 1415)  methylPREDNISolone sodium succinate (SOLU-MEDROL) 125 mg/2 mL injection 125 mg (125 mg Intravenous Given 10/14/21 1416)  famotidine (PEPCID) IVPB 20 mg premix (0 mg Intravenous Stopped 10/14/21 1454)  sodium chloride 0.9 % bolus 500 mL (0 mLs Intravenous Stopped 10/14/21 1714)    ED Course/ Medical Decision Making/ A&P  Medical Decision Making Risk Prescription drug management.   71 y.o. female who underwent robotic low anterior resection with double stapled colorectal anastomosis, takedown of colovesical fistula, lysis adhesion 09/08/2021, was on Bactrim for 10 days staring 2/13-2/23 (chronically colonized bladder), saw Urology yesterday and had UA done and culture and was given rx for keflex but did not take it due to fear of side effects and took bactrim instead this AM, after that started to feel hot at pruritis, then took an enema and after that develop more severe full body rash, sensation of tongue swelling.  Presentation consistent with anaphylaxis with diffuse pruritic rash, sensation of tongue and throat swelling.    Given epinephrine, solumedrol, benadryl.  On reevaluation is improved with some continued burning/itching of ears.  Care signed out to Dr. Tyrone Nine for conintued observation and reevaluation.    Given rx for new epi pen, prednisone, recommend benadryl q6hr.    Unclear given timing of allergic reaction exact trigger--did start bactrim again today and had symptoms prior to enema which then worsened. Have listed bactrim as allergy.  Encouraged pt to take keflex for UTI that was rx by Urology.  She does not have abdominal pain, n/v or other acute medical concerns today to suggest need for additional labs/imaging.          Final Clinical Impression(s) / ED Diagnoses Final diagnoses:  Anaphylaxis, initial encounter    Rx / DC Orders ED Discharge Orders          Ordered    EPINEPHrine 0.3 mg/0.3 mL IJ SOAJ injection  As needed        10/14/21 1557    predniSONE (DELTASONE) 10 MG tablet  Daily,   Status:  Discontinued        10/14/21 1557    EPINEPHrine 0.3 mg/0.3 mL IJ SOAJ injection  As needed        10/14/21 1659    predniSONE (DELTASONE) 20 MG tablet        10/14/21 1659              Gareth Morgan, MD 10/16/21 [phone]

## 2021-10-14 NOTE — Discharge Instructions (Signed)
STOP the bactrim. I am concerned this may have been the cause of your severe allergic reaction.  Begin taking the cephalexin prescribed by urology for your suspected UTI.  ?

## 2021-10-14 NOTE — ED Notes (Signed)
ED Provider at bedside. Dr. Billy Fischer ?

## 2021-10-14 NOTE — ED Provider Notes (Signed)
I seen the patient in signout from Dr. Billy Fischer, briefly the patient is a 71 year old female with an allergic reaction.  Has a history of allergies and has an EpiPen at home.  Required EpiPen injection here.  I reassessed the patient a couple hours after her arrival and she feels much better.  I discussed risk and benefits of further observation versus discharge and the patient and family are electing for discharge at this time.  Burst of steroids antihistamines at home.  PCP follow-up. ? ? ?  ?Deno Etienne, DO ?10/14/21 1719 ? ?

## 2021-10-14 NOTE — ED Triage Notes (Signed)
Pt c/o allergic reaction 20 mins pta. Pt red, c/o tongue swelling. Took 25 mg benadryl pta. Dr. Billy Fischer at bedside ?

## 2021-10-15 ENCOUNTER — Encounter: Payer: Self-pay | Admitting: Physician Assistant

## 2021-10-15 DIAGNOSIS — F419 Anxiety disorder, unspecified: Secondary | ICD-10-CM

## 2021-10-16 ENCOUNTER — Encounter: Payer: Self-pay | Admitting: Physician Assistant

## 2021-10-16 ENCOUNTER — Telehealth: Payer: Self-pay

## 2021-10-16 MED ORDER — ALPRAZOLAM 0.5 MG PO TABS: 0.5000 mg | ORAL_TABLET | Freq: Every evening | ORAL | 5 refills | Status: DC | PRN

## 2021-10-16 NOTE — Telephone Encounter (Signed)
Transition Care Management Follow-up Telephone Call ?Date of discharge and from where: 10/14/2021 from [facility] ?How have you been since you were released from the hospital? Patient stated that she is feeling some better but stated that she did have a similar reaction today and had not taken any abx prior to symptoms starting this a.m. Patient stated that she was feeling hot all over. Patient took a prednisone and symptoms subsided.  ?Any questions or concerns? No ? ?Items Reviewed: ?Did the pt receive and understand the discharge instructions provided? Yes  ?Medications obtained and verified? Yes  ?Other? No  ?Any new allergies since your discharge? No  ?Dietary orders reviewed? No ?Do you have support at home? Yes  ? ?Functional Questionnaire: (I = Independent and D = Dependent) ?ADLs: I ? ?Bathing/Dressing- I ? ?Meal Prep- I ? ?Eating- I ? ?Maintaining continence- I ? ?Transferring/Ambulation- I ? ?Managing Meds- I ? ? ?Follow up appointments reviewed: ? ?PCP Hospital f/u appt confirmed? No   ?Specialist Hospital f/u appt confirmed? Yes  Scheduled to see Nadeen Landau, MD on 10/17/2021 @ 09:30am. ?Are transportation arrangements needed? No  ?If their condition worsens, is the pt aware to call PCP or go to the Emergency Dept.? Yes ?Was the patient provided with contact information for the PCP's office or ED? Yes ?Was to pt encouraged to call back with questions or concerns? Yes ? ?

## 2021-10-17 ENCOUNTER — Encounter: Payer: Self-pay | Admitting: Physician Assistant

## 2021-10-18 ENCOUNTER — Other Ambulatory Visit: Payer: Self-pay | Admitting: Physician Assistant

## 2021-10-18 DIAGNOSIS — B029 Zoster without complications: Secondary | ICD-10-CM

## 2021-10-20 NOTE — Telephone Encounter (Signed)
Need clarification on these. Please call pt. The only thing I see is a urine culture and she does need to treat this so I have no idea what she is saying about [employer]   ?

## 2021-11-01 ENCOUNTER — Encounter: Payer: Self-pay | Admitting: Physician Assistant

## 2021-11-01 DIAGNOSIS — R1030 Lower abdominal pain, unspecified: Secondary | ICD-10-CM

## 2021-11-01 DIAGNOSIS — Z9049 Acquired absence of other specified parts of digestive tract: Secondary | ICD-10-CM

## 2021-11-01 DIAGNOSIS — Z9889 Other specified postprocedural states: Secondary | ICD-10-CM

## 2021-11-02 ENCOUNTER — Encounter: Payer: Self-pay | Admitting: Physician Assistant

## 2021-11-02 DIAGNOSIS — R1030 Lower abdominal pain, unspecified: Secondary | ICD-10-CM

## 2021-11-02 DIAGNOSIS — Z9049 Acquired absence of other specified parts of digestive tract: Secondary | ICD-10-CM

## 2021-11-02 DIAGNOSIS — Z9889 Other specified postprocedural states: Secondary | ICD-10-CM

## 2021-11-07 ENCOUNTER — Encounter: Payer: Self-pay | Admitting: Physician Assistant

## 2021-11-07 NOTE — Progress Notes (Signed)
WBC looks good.  ?Hemoglobin looks good.  ?Urine microscopic does not show any bacteria.  ?Liver looks good.  ?Kidney function has dropped some. Sodium high.  ?Have you had the CT yet?

## 2021-11-08 ENCOUNTER — Other Ambulatory Visit: Payer: Self-pay | Admitting: Surgery

## 2021-11-08 DIAGNOSIS — N321 Vesicointestinal fistula: Secondary | ICD-10-CM

## 2021-11-08 LAB — URINE CULTURE

## 2021-11-09 ENCOUNTER — Other Ambulatory Visit: Payer: Self-pay | Admitting: Physician Assistant

## 2021-11-09 ENCOUNTER — Encounter: Payer: Self-pay | Admitting: Physician Assistant

## 2021-11-09 LAB — URINALYSIS, ROUTINE W REFLEX MICROSCOPIC
Bilirubin, UA: NEGATIVE
Glucose, UA: NEGATIVE
Ketones, UA: NEGATIVE
Nitrite, UA: NEGATIVE
Protein,UA: NEGATIVE
RBC, UA: NEGATIVE
Specific Gravity, UA: 1.017 (ref 1.005–1.030)
Urobilinogen, Ur: 0.2 mg/dL (ref 0.2–1.0)
pH, UA: 5.5 (ref 5.0–7.5)

## 2021-11-09 LAB — CBC WITH DIFFERENTIAL/PLATELET
Basophils Absolute: 0 10*3/uL (ref 0.0–0.2)
Basos: 1 %
EOS (ABSOLUTE): 0 10*3/uL (ref 0.0–0.4)
Eos: 0 %
Hematocrit: 36.1 % (ref 34.0–46.6)
Hemoglobin: 12.5 g/dL (ref 11.1–15.9)
Immature Grans (Abs): 0 10*3/uL (ref 0.0–0.1)
Immature Granulocytes: 0 %
Lymphocytes Absolute: 1.4 10*3/uL (ref 0.7–3.1)
Lymphs: 27 %
MCH: 31.5 pg (ref 26.6–33.0)
MCHC: 34.6 g/dL (ref 31.5–35.7)
MCV: 91 fL (ref 79–97)
Monocytes Absolute: 0.4 10*3/uL (ref 0.1–0.9)
Monocytes: 7 %
Neutrophils Absolute: 3.3 10*3/uL (ref 1.4–7.0)
Neutrophils: 65 %
Platelets: 276 10*3/uL (ref 150–450)
RBC: 3.97 x10E6/uL (ref 3.77–5.28)
RDW: 11.7 % (ref 11.7–15.4)
WBC: 5.1 10*3/uL (ref 3.4–10.8)

## 2021-11-09 LAB — CMP14+EGFR
ALT: 14 IU/L (ref 0–32)
AST: 21 IU/L (ref 0–40)
Albumin/Globulin Ratio: 2.2 (ref 1.2–2.2)
Albumin: 4.7 g/dL (ref 3.8–4.8)
Alkaline Phosphatase: 74 IU/L (ref 44–121)
BUN/Creatinine Ratio: 11 — ABNORMAL LOW (ref 12–28)
BUN: 12 mg/dL (ref 8–27)
Bilirubin Total: 0.4 mg/dL (ref 0.0–1.2)
CO2: 26 mmol/L (ref 20–29)
Calcium: 9.5 mg/dL (ref 8.7–10.3)
Chloride: 104 mmol/L (ref 96–106)
Creatinine, Ser: 1.12 mg/dL — ABNORMAL HIGH (ref 0.57–1.00)
Globulin, Total: 2.1 g/dL (ref 1.5–4.5)
Glucose: 124 mg/dL — ABNORMAL HIGH (ref 70–99)
Potassium: 4.2 mmol/L (ref 3.5–5.2)
Sodium: 146 mmol/L — ABNORMAL HIGH (ref 134–144)
Total Protein: 6.8 g/dL (ref 6.0–8.5)
eGFR: 54 mL/min/{1.73_m2} — ABNORMAL LOW (ref >59)

## 2021-11-09 LAB — IODINE, SERUM/PLASMA: Iodine: 43.4 ug/L (ref 40.0–92.0)

## 2021-11-09 LAB — MICROSCOPIC EXAMINATION
Bacteria, UA: NONE SEEN
Casts: NONE SEEN /lpf
RBC, Urine: NONE SEEN /hpf (ref 0–2)

## 2021-11-09 MED ORDER — CEFDINIR 300 MG PO CAPS: 300.0000 mg | ORAL_CAPSULE | Freq: Two times a day (BID) | ORAL | 0 refills | Status: DC

## 2021-11-09 NOTE — Progress Notes (Signed)
Are you having any urinary symptoms currently? You have some antibiotic resistant bacteria present but in small colony counts. If you are having symptoms then start ominicef. Sent to pharmacy.

## 2021-11-09 NOTE — Telephone Encounter (Signed)
You already sent [place] to the pharmacy and patient was made aware of that in a different mychart encounter.  ?

## 2021-11-12 ENCOUNTER — Encounter: Payer: Self-pay | Admitting: Physician Assistant

## 2021-11-12 LAB — CULTURE, BLOOD (SINGLE)

## 2021-11-13 ENCOUNTER — Encounter: Payer: Self-pay | Admitting: Physician Assistant

## 2021-11-13 NOTE — Progress Notes (Signed)
No bacteria in blood culture.

## 2021-11-14 ENCOUNTER — Encounter: Payer: Self-pay | Admitting: Physician Assistant

## 2021-11-15 ENCOUNTER — Encounter: Payer: Self-pay | Admitting: Physician Assistant

## 2021-11-16 ENCOUNTER — Telehealth: Payer: Self-pay

## 2021-11-16 ENCOUNTER — Encounter: Payer: Self-pay | Admitting: Physician Assistant

## 2021-11-16 MED ORDER — LEVOFLOXACIN 750 MG PO TABS: 750.0000 mg | ORAL_TABLET | Freq: Every day | ORAL | 0 refills | Status: DC

## 2021-11-16 NOTE — Progress Notes (Signed)
Phone call to patient to review instructions for 13 hr prep for CT w/ contrast via rectum on 11/17/21 at 3pm. Per radiologist pt does need a 13 hr prep for this study. Prescription called into [place]. Pt aware and verbalized understanding of instructions. ?Prescription: ?11/17/21 @ 2 AM - '50mg'$  Prednisone ?11/17/21 @ 8 AM - '50mg'$  Prednisone ?11/17/21 @ 2 PM - '50mg'$  Prednisone and '50mg'$  Benadryl  ? ?Benadryl also sent in as a prescription, per the pts request. Pt advised not to drive the day of taking this medication as it may cause drowsiness. Pt verbalized understanding.   ?

## 2021-11-17 ENCOUNTER — Encounter: Payer: Self-pay | Admitting: Physician Assistant

## 2021-11-17 ENCOUNTER — Ambulatory Visit
Admission: RE | Admit: 2021-11-17 | Discharge: 2021-11-17 | Disposition: A | Discharge status: Home / Self Care | Payer: Medicare Other | Source: Ambulatory Visit | Attending: Surgery | Admitting: Surgery

## 2021-11-17 DIAGNOSIS — N321 Vesicointestinal fistula: Secondary | ICD-10-CM

## 2021-11-18 ENCOUNTER — Encounter: Payer: Self-pay | Admitting: Physician Assistant

## 2021-11-20 ENCOUNTER — Encounter: Payer: Self-pay | Admitting: Physician Assistant

## 2021-11-20 ENCOUNTER — Telehealth (INDEPENDENT_AMBULATORY_CARE_PROVIDER_SITE_OTHER): Payer: Medicare Other | Admitting: Physician Assistant

## 2021-11-20 ENCOUNTER — Other Ambulatory Visit: Payer: Medicare Other

## 2021-11-20 DIAGNOSIS — N321 Vesicointestinal fistula: Secondary | ICD-10-CM

## 2021-11-20 DIAGNOSIS — Z1630 Resistance to unspecified antimicrobial drugs: Secondary | ICD-10-CM

## 2021-11-20 DIAGNOSIS — A499 Bacterial infection, unspecified: Secondary | ICD-10-CM | POA: Diagnosis not present

## 2021-11-20 DIAGNOSIS — N390 Urinary tract infection, site not specified: Secondary | ICD-10-CM | POA: Diagnosis not present

## 2021-11-20 HISTORY — DX: Vesicointestinal fistula: N32.1

## 2021-11-20 HISTORY — DX: Bacterial infection, unspecified: A49.9

## 2021-11-20 NOTE — Telephone Encounter (Signed)
Looks like this is an infectious disease doc at [facility]. Referral pended.  ?
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
## 2021-11-20 NOTE — Telephone Encounter (Signed)
For referral ?

## 2021-11-20 NOTE — Telephone Encounter (Signed)
Separate message already sent regarding this and referral was pended.  ?

## 2021-11-27 ENCOUNTER — Ambulatory Visit: Payer: Medicare Other | Admitting: Family

## 2021-11-29 ENCOUNTER — Ambulatory Visit (INDEPENDENT_AMBULATORY_CARE_PROVIDER_SITE_OTHER): Payer: Medicare Other | Admitting: Physician Assistant

## 2021-11-29 ENCOUNTER — Encounter: Payer: Self-pay | Admitting: Physician Assistant

## 2021-11-29 VITALS — BP 123/68 | HR 73 | Temp 96.7°F | Ht 65.0 in | Wt 132.0 lb

## 2021-11-29 DIAGNOSIS — R8271 Bacteriuria: Secondary | ICD-10-CM

## 2021-11-29 DIAGNOSIS — B0229 Other postherpetic nervous system involvement: Secondary | ICD-10-CM

## 2021-11-29 DIAGNOSIS — R739 Hyperglycemia, unspecified: Secondary | ICD-10-CM

## 2021-11-29 DIAGNOSIS — R7309 Other abnormal glucose: Secondary | ICD-10-CM

## 2021-11-29 DIAGNOSIS — N390 Urinary tract infection, site not specified: Secondary | ICD-10-CM | POA: Diagnosis not present

## 2021-11-29 HISTORY — DX: Other abnormal glucose: R73.09

## 2021-11-29 HISTORY — DX: Hyperglycemia, unspecified: R73.9

## 2021-11-29 HISTORY — DX: Bacteriuria: R82.71

## 2021-11-29 MED ORDER — ACYCLOVIR 200 MG PO CAPS: 800.0000 mg | ORAL_CAPSULE | Freq: Every day | ORAL | 0 refills | Status: DC

## 2021-11-29 MED ORDER — ACCU-CHEK GUIDE ME W/DEVICE KIT: PACK | 99 refills | Status: DC

## 2021-11-29 NOTE — Progress Notes (Signed)
? ?Acute Office Visit ? ?Subjective:  ? ?  ?Patient ID: Jamie [name], female    DOB: 02-27-1951, 71 y.o.   MRN: [id number] ? ?Chief Complaint  ?Patient presents with  ? Follow-up  ? ? ?HPI ?Patient is in today to discuss infectious disease appt and next steps. She has having persistent bacteruria and recurrent UTIs after colovesical fistula repair in jan 2023.  She did see ID specialist on 4/13 and below as A and P.  ? ?Jamie [name] is 71 y.o. female with past medical history significant for colovesicular fistula s/p repair, HTN, GERD, hypothyroidism, DM who presents for recurrent UTI. Patient has been seen by many providers for her symptoms and has been prescribed various antibiotics in the past. She reports her symptoms are quite manageable and has had no clear UTI symptoms such as dysuria or true urinary frequency. Concern is low for kidney involvement due to normal physical exam findings (no CVA tenderness). Would not recommend further testing at this time or treatment due to risk of antibiotic resistence and history of possible adverse reactions to various antibiotics. Discussed symptoms of true urinary tract infection such as fever, severe unilateral kidney pain, dysuria, etc. Patient is amenable to vaginal estrogen cream for UTI prophylaxis after explanation of risks and benefits. Will refer to urogynecology and primary care as patient is interested in transferring care to [facility] system and would like to explore other options for treatment of urinary incontinence.  ? ?Pt is concerned and wants her urine tested again. She is also concerned about the possible enterovesical fistula that could not be ruled out on CT.  ? ?She continues to have some intermittent dysuria but no fever, chills, nausea, body aches.  ? ?She continues to have post herpetic pain where she had shingles on her left upper chest and breast. She is not taking her gabapentin regularly.  ? ?Requesting glucose monitor to check her sugars as  needed since she did have some readings in hospital that were elevated.  ? ?She is wanting certain blood work.  ? ?ROS ? ?See HPI.  ?   ?Objective:  ?  ?BP 123/68   Pulse 73   Temp (!) 96.7 ?F (35.9 ?C) (Temporal)   Ht _0  (1.651 m)   Wt 132 lb (59.9 kg)   SpO2 100%   BMI 21.97 kg/m?  ? ? ?Physical Exam ?Vitals reviewed.  ?Constitutional:   ?   Appearance: Normal appearance.  ?HENT:  ?   Head: Normocephalic.  ?Cardiovascular:  ?   Rate and Rhythm: Normal rate and regular rhythm.  ?   Pulses: Normal pulses.  ?   Heart sounds: Normal heart sounds.  ?Pulmonary:  ?   Effort: Pulmonary effort is normal.  ?   Breath sounds: Normal breath sounds.  ?Abdominal:  ?   General: Bowel sounds are normal. There is no distension.  ?   Palpations: Abdomen is soft. There is no mass.  ?   Tenderness: There is no abdominal tenderness. There is no right CVA tenderness, left CVA tenderness, guarding or rebound.  ?   Hernia: No hernia is present.  ?Musculoskeletal:  ?   Right lower leg: No edema.  ?   Left lower leg: No edema.  ?Neurological:  ?   General: No focal deficit present.  ?   Mental Status: She is alert and oriented to person, place, and time.  ?Psychiatric:     ?   Mood and Affect: Mood normal.  ? ? ? ? ?   ?  Assessment & Plan:  ?..[name] was seen today for follow-up. ? ?Diagnoses and all orders for this visit: ? ?Bacteriuria ?-     CBC w/Diff/Platelet ?-     Urine Culture ? ?Post herpetic neuralgia ?-     acyclovir (ZOVIRAX) 200 MG capsule; Take 4 capsules (800 mg total) by mouth 5 (five) times daily. For 7 days. ? ?Recurrent UTI ?-     BASIC METABOLIC PANEL WITH GFR ?-     CBC w/Diff/Platelet ?-     Urine Culture ? ?Elevated random blood glucose level ?-     Blood Glucose Monitoring Suppl (ACCU-CHEK GUIDE ME) w/Device KIT; Dx R73.09 Elevated glucose - Check blood sugar 4 times daily. ? ? ?Reviewed chart and previous providers notes to explain to patient the reason why he did not want to treat bacteria in urine.   ?Discussed what blood work was NOT needed and agreed to recheck CBC and BMP.  ?Agreed to culture urine but explained would not treat unless patient was symptomatic. ?Agree with ID specialist about uro/gyn appt to look further into ?another fistula.  ?Explained Dr. [name] was not concerned with CT findings ? ?Glucose kit ordered to check sugars as needed.  ? ?Discussed post herpetic pain ?HO given ?Increase gabapentin to TID usage ? ?Reviewed chart and discussed findings and plan with patient along with coordinating care for 40 minutes.  ? ?[name], PA-C ? ? ?

## 2021-11-29 NOTE — Patient Instructions (Addendum)
Get 2nd opinion with urogyneocology ? ?Will call with results ? ?Gabapentin up to three times a day for post herpetic pain ? ?Postherpetic Neuralgia ?Postherpetic neuralgia (PHN) is nerve pain that occurs after a shingles infection. Shingles is a painful rash that appears on one area of the body, usually on the trunk or face. Shingles is caused by the varicella-zoster virus. This is the same virus that causes chickenpox. In people who have had chickenpox, the virus can resurface years later and cause shingles. ?PHN appears in the same area where you had the shingles rash. The pain usually goes away after the rash disappears. You may have PHN if you continue to have pain 3 months after your shingles rash has gone away. ?What are the causes? ?This condition is caused by damage to your nerves due to inflammation from the varicella-zoster virus. The damage makes your nerves overly sensitive. ?What increases the risk? ?The following factors may make you more likely to develop this condition: ?Being older than 69 years of age. ?Having severe pain before your shingles rash starts. ?Having a severe rash. ?Having shingles in and around the eye area. ?Having a disease or taking medicine that causes you to have a weakened disease-fighting system (immune system). ?What are the signs or symptoms? ?The main symptom of this condition is pain. The pain may: ?Often be severe and may be described as stabbing, burning, shooting, or feeling like an electric shock. ?Come and go, or it may be there all the time. ?Be triggered by light touches on the skin or changes in temperature. ?You may have itching along with the pain. ?How is this diagnosed? ?This condition may be diagnosed based on your symptoms and your history of shingles. Lab studies and other diagnostic tests are usually not needed. ?How is this treated? ?There is no cure for this condition. Treatment for PHN will focus on pain relief. Over-the-counter pain relievers do not  usually relieve PHN pain. You may need to work with a pain specialist. Treatment may include: ?Anti-seizure medicines to relieve nerve pain. ?Antidepressant medicines to help with pain and improve sleep. ?A numbing patch worn on the skin (lidocaine patch). ?Strong pain relievers (opioids). ?Injections of numbing medicine or anti-inflammatory medicines around irritated nerves. ?Injections of botulinum toxin to block pain signals between nerves and muscles. ?Follow these instructions at home: ?Medicines ?Take over-the-counter and prescription medicines only as told by your health care provider. ?Ask your health care provider if the medicine prescribed to you: ?Requires you to avoid driving or using machinery. ?Can cause constipation. You may need to take these actions to prevent or treat constipation: ?Drink enough fluid to keep your urine pale yellow. ?Take over-the-counter or prescription medicines. ?Eat foods that are high in fiber, such as beans, whole grains, and fresh fruits and vegetables. ?Limit foods that are high in fat and processed sugars, such as fried or sweet foods. ?Managing pain ? ?If directed, put ice on the painful area. To do this: ?Put ice in a plastic bag. ?Place a towel between your skin and the bag. ?Leave the ice on for 20 minutes, 2-3 times a day. ?Remove the ice if your skin turns bright red. This is very important. If you cannot feel pain, heat, or cold, you have a greater risk of damage to the area. ?Cover sensitive areas with a bandage (dressing) to reduce friction from clothing rubbing on the area. ?General instructions ?It may take a long time to recover from PHN. Work closely with your  health care provider and develop a good support system at home. You may consider joining a support group. ?Wear loose, comfortable clothing. ?Talk to your health care provider if you feel depressed or desperate. Living with long-term pain can be depressing. ?Keep all follow-up visits. This is  important. ?How is this prevented? ?Getting a vaccination for shingles can prevent PHN. The shingles vaccine is recommended for people older than age 95. It may prevent shingles and may also lower your risk of PHN if you do get shingles. ?Contact a health care provider if: ?Your medicine is not helping. ?You are struggling to manage your pain at home. ?Get help right away if: ?You have thoughts about hurting yourself or others. ?If you ever feel like you may hurt yourself or others, or have thoughts about taking your own life, get help right away. Go to your nearest emergency department or: ?Call your local emergency services (911 in the [place] ?Call a suicide crisis helpline, such as the [place] at 641-769-6444 or 988 in the [place]. This is open 24 hours a day in the [place] ?Text the Crisis Text Line at (202) 811-9500 (in the [place].). ?Summary ?Postherpetic neuralgia (PHN) is a very painful disorder that can occur after an episode of shingles. ?The pain is often severe and may be described as stabbing, burning, shooting, or feeling like an electric shock. ?Prescription medicines can be helpful in managing persistent pain. ?Getting a vaccination for shingles can prevent PHN. This vaccine is recommended for people older than age 18. ?This information is not intended to replace advice given to you by your health care provider. Make sure you discuss any questions you have with your health care provider. ?Document Revised: 02/22/2021 Document Reviewed: 07/25/2020 ?Elsevier Patient Education ? [place]. ? ?

## 2021-11-30 ENCOUNTER — Ambulatory Visit: Payer: Medicare Other

## 2021-11-30 LAB — CBC WITH DIFFERENTIAL/PLATELET
Absolute Monocytes: 455 cells/uL (ref 200–950)
Basophils Absolute: 40 cells/uL (ref 0–200)
Basophils Relative: 0.8 %
Eosinophils Absolute: 40 cells/uL (ref 15–500)
Eosinophils Relative: 0.8 %
HCT: 40.1 % (ref 35.0–45.0)
Hemoglobin: 13.6 g/dL (ref 11.7–15.5)
Lymphs Abs: 1200 cells/uL (ref 850–3900)
MCH: 31.7 pg (ref 27.0–33.0)
MCHC: 33.9 g/dL (ref 32.0–36.0)
MCV: 93.5 fL (ref 80.0–100.0)
MPV: 10.4 fL (ref 7.5–12.5)
Monocytes Relative: 9.1 %
Neutro Abs: 3265 cells/uL (ref 1500–7800)
Neutrophils Relative %: 65.3 %
Platelets: 292 10*3/uL (ref 140–400)
RBC: 4.29 10*6/uL (ref 3.80–5.10)
RDW: 11.9 % (ref 11.0–15.0)
Total Lymphocyte: 24 %
WBC: 5 10*3/uL (ref 3.8–10.8)

## 2021-11-30 LAB — BASIC METABOLIC PANEL WITH GFR
BUN: 15 mg/dL (ref 7–25)
CO2: 31 mmol/L (ref 20–32)
Calcium: 9.8 mg/dL (ref 8.6–10.4)
Chloride: 105 mmol/L (ref 98–110)
Creat: 0.9 mg/dL (ref 0.50–1.05)
Glucose, Bld: 93 mg/dL (ref 65–99)
Potassium: 5.1 mmol/L (ref 3.5–5.3)
Sodium: 141 mmol/L (ref 135–146)
eGFR: 70 mL/min/{1.73_m2} (ref >=60)

## 2021-11-30 NOTE — Progress Notes (Signed)
Kidney function improved! GREAT news.  ?Sodium looks great.  ?Hemoglobin looks wonderful ?WBC looks GREAT.  ?Very reassuring labs.

## 2021-12-02 ENCOUNTER — Encounter: Payer: Self-pay | Admitting: Physician Assistant

## 2021-12-02 LAB — URINE CULTURE
MICRO NUMBER:: 13289140
SPECIMEN QUALITY:: ADEQUATE

## 2021-12-04 ENCOUNTER — Encounter: Payer: Self-pay | Admitting: Physician Assistant

## 2021-12-04 ENCOUNTER — Other Ambulatory Visit: Payer: Self-pay | Admitting: Neurology

## 2021-12-04 DIAGNOSIS — N390 Urinary tract infection, site not specified: Secondary | ICD-10-CM

## 2021-12-04 DIAGNOSIS — R8271 Bacteriuria: Secondary | ICD-10-CM

## 2021-12-04 DIAGNOSIS — F419 Anxiety disorder, unspecified: Secondary | ICD-10-CM

## 2021-12-04 NOTE — Telephone Encounter (Signed)
Patient has seen the results from [facility].  ?

## 2021-12-04 NOTE — Progress Notes (Signed)
Please send to urology for them to review. ?Lots of resistant antibiotics to E.coli.   ?No treatment unless pt is having symptoms of infection.

## 2021-12-05 ENCOUNTER — Encounter: Payer: Self-pay | Admitting: Physician Assistant

## 2021-12-05 MED ORDER — ALPRAZOLAM 0.5 MG PO TABS: 0.5000 mg | ORAL_TABLET | Freq: Two times a day (BID) | ORAL | 5 refills | Status: DC | PRN

## 2021-12-11 ENCOUNTER — Encounter: Payer: Self-pay | Admitting: Physician Assistant

## 2021-12-20 ENCOUNTER — Ambulatory Visit (INDEPENDENT_AMBULATORY_CARE_PROVIDER_SITE_OTHER): Payer: Medicare Other | Admitting: Physician Assistant

## 2021-12-20 ENCOUNTER — Encounter: Payer: Self-pay | Admitting: Physician Assistant

## 2021-12-20 ENCOUNTER — Ambulatory Visit (INDEPENDENT_AMBULATORY_CARE_PROVIDER_SITE_OTHER): Payer: Medicare Other

## 2021-12-20 VITALS — BP 106/66 | HR 79 | Temp 98.6°F | Ht 65.0 in | Wt 136.0 lb

## 2021-12-20 DIAGNOSIS — R079 Chest pain, unspecified: Secondary | ICD-10-CM

## 2021-12-20 DIAGNOSIS — J209 Acute bronchitis, unspecified: Secondary | ICD-10-CM

## 2021-12-20 DIAGNOSIS — R053 Chronic cough: Secondary | ICD-10-CM

## 2021-12-20 DIAGNOSIS — N390 Urinary tract infection, site not specified: Secondary | ICD-10-CM | POA: Diagnosis not present

## 2021-12-20 DIAGNOSIS — R051 Acute cough: Secondary | ICD-10-CM

## 2021-12-20 DIAGNOSIS — Z201 Contact with and (suspected) exposure to tuberculosis: Secondary | ICD-10-CM | POA: Diagnosis not present

## 2021-12-20 DIAGNOSIS — R0602 Shortness of breath: Secondary | ICD-10-CM

## 2021-12-20 DIAGNOSIS — R8271 Bacteriuria: Secondary | ICD-10-CM | POA: Diagnosis not present

## 2021-12-20 DIAGNOSIS — R052 Subacute cough: Secondary | ICD-10-CM

## 2021-12-20 MED ORDER — ALBUTEROL SULFATE HFA 108 (90 BASE) MCG/ACT IN AERS: 2.0000 | INHALATION_SPRAY | Freq: Four times a day (QID) | RESPIRATORY_TRACT | 0 refills | Status: DC | PRN

## 2021-12-20 MED ORDER — AZITHROMYCIN 250 MG PO TABS: ORAL_TABLET | ORAL | 0 refills | Status: DC

## 2021-12-20 MED ORDER — PREDNISONE 50 MG PO TABS: ORAL_TABLET | ORAL | 0 refills | Status: DC

## 2021-12-20 MED ORDER — BENZONATATE 200 MG PO CAPS: 200.0000 mg | ORAL_CAPSULE | Freq: Three times a day (TID) | ORAL | 0 refills | Status: DC | PRN

## 2021-12-20 NOTE — Progress Notes (Signed)
No acute process found in lungs.

## 2021-12-20 NOTE — Patient Instructions (Signed)
Acute Bronchitis, Adult ? ?Acute bronchitis is sudden inflammation of the main airways (bronchi) that come off the windpipe (trachea) in the lungs. The swelling causes the airways to get smaller and make more mucus than normal. This can make it hard to breathe and can cause coughing or noisy breathing (wheezing). ?Acute bronchitis may last several weeks. The cough may last longer. Allergies, asthma, and exposure to smoke may make the condition worse. ?What are the causes? ?This condition can be caused by germs and by substances that irritate the lungs, including: ?Cold and flu viruses. The most common cause of this condition is the virus that causes the common cold. ?Bacteria. This is less common. ?Breathing in substances that irritate the lungs, including: ?Smoke from cigarettes and other forms of tobacco. ?Dust and pollen. ?Fumes from household cleaning products, gases, or burned fuel. ?Indoor or outdoor air pollution. ?What increases the risk? ?The following factors may make you more likely to develop this condition: ?A weak body's defense system, also called the immune system. ?A condition that affects your lungs and breathing, such as asthma. ?What are the signs or symptoms? ?Common symptoms of this condition include: ?Coughing. This may bring up clear, yellow, or green mucus from your lungs (sputum). ?Wheezing. ?Runny or stuffy nose. ?Having too much mucus in your lungs (chest congestion). ?Shortness of breath. ?Aches and pains, including sore throat or chest. ?How is this diagnosed? ?This condition is usually diagnosed based on: ?Your symptoms and medical history. ?A physical exam. ?You may also have other tests, including tests to rule out other conditions, such as pneumonia. These tests include: ?A test of lung function. ?Test of a mucus sample to look for the presence of bacteria. ?Tests to check the oxygen level in your blood. ?Blood tests. ?Chest X-ray. ?How is this treated? ?Most cases of acute  bronchitis clear up over time without treatment. Your health care provider may recommend: ?Drinking more fluids to help thin your mucus so it is easier to cough up. ?Taking inhaled medicine (inhaler) to improve air flow in and out of your lungs. ?Using a vaporizer or a humidifier. These are machines that add water to the air to help you breathe better. ?Taking a medicine that thins mucus and clears congestion (expectorant). ?Taking a medicine that prevents or stops coughing (cough suppressant). ?It is notcommon to take an antibiotic medicine for this condition. ?Follow these instructions at home: ? ?Take over-the-counter and prescription medicines only as told by your health care provider. ?Use an inhaler, vaporizer, or humidifier as told by your health care provider. ?Take two teaspoons (10 mL) of honey at bedtime to lessen coughing at night. ?Drink enough fluid to keep your urine pale yellow. ?Do not use any products that contain nicotine or tobacco. These products include cigarettes, chewing tobacco, and vaping devices, such as e-cigarettes. If you need help quitting, ask your health care provider. ?Get plenty of rest. ?Return to your normal activities as told by your health care provider. Ask your health care provider what activities are safe for you. ?Keep all follow-up visits. This is important. ?How is this prevented? ?To lower your risk of getting this condition again: ?Wash your hands often with soap and water for at least 20 seconds. If soap and water are not available, use hand sanitizer. ?Avoid contact with people who have cold symptoms. ?Try not to touch your mouth, nose, or eyes with your hands. ?Avoid breathing in smoke or chemical fumes. Breathing smoke or chemical fumes will make your   condition worse. ?Get the flu shot every year. ?Contact a health care provider if: ?Your symptoms do not improve after 2 weeks. ?You have trouble coughing up the mucus. ?Your cough keeps you awake at night. ?You have a  fever. ?Get help right away if you: ?Cough up blood. ?Feel pain in your chest. ?Have severe shortness of breath. ?Faint or keep feeling like you are going to faint. ?Have a severe headache. ?Have a fever or chills that get worse. ?These symptoms may represent a serious problem that is an emergency. Do not wait to see if the symptoms will go away. Get medical help right away. Call your local emergency services (911 in the [place] Do not drive yourself to the hospital. ?Summary ?Acute bronchitis is inflammation of the main airways (bronchi) that come off the windpipe (trachea) in the lungs. The swelling causes the airways to get smaller and make more mucus than normal. ?Drinking more fluids can help thin your mucus so it is easier to cough up. ?Take over-the-counter and prescription medicines only as told by your health care provider. ?Do not use any products that contain nicotine or tobacco. These products include cigarettes, chewing tobacco, and vaping devices, such as e-cigarettes. If you need help quitting, ask your health care provider. ?Contact a health care provider if your symptoms do not improve after 2 weeks. ?This information is not intended to replace advice given to you by your health care provider. Make sure you discuss any questions you have with your health care provider. ?Document Revised: 11/30/2020 Document Reviewed: 11/30/2020 ?Elsevier Patient Education ? 2023 [employer] ? ?
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
## 2021-12-23 LAB — URINE CULTURE
MICRO NUMBER:: 13382197
SPECIMEN QUALITY:: ADEQUATE

## 2021-12-25 NOTE — Progress Notes (Signed)
E.coli detected but colony count less than previous. Any worsening urinary symptoms/fever/abdominal pain?

## 2021-12-26 LAB — BORDETELLA PERTUSSIS PCR
B. PERTUSSIS DNA: NOT DETECTED
B. parapertussis DNA: NOT DETECTED

## 2021-12-26 NOTE — Progress Notes (Signed)
Negative for pertussis.

## 2022-01-17 ENCOUNTER — Encounter: Payer: Self-pay | Admitting: Physician Assistant

## 2022-01-17 ENCOUNTER — Ambulatory Visit (INDEPENDENT_AMBULATORY_CARE_PROVIDER_SITE_OTHER): Payer: Medicare Other | Admitting: Physician Assistant

## 2022-01-17 VITALS — BP 128/64 | HR 77 | Ht 65.0 in | Wt 136.0 lb

## 2022-01-17 DIAGNOSIS — N321 Vesicointestinal fistula: Secondary | ICD-10-CM

## 2022-01-17 DIAGNOSIS — R350 Frequency of micturition: Secondary | ICD-10-CM | POA: Diagnosis not present

## 2022-01-17 DIAGNOSIS — R8271 Bacteriuria: Secondary | ICD-10-CM

## 2022-01-17 DIAGNOSIS — B0229 Other postherpetic nervous system involvement: Secondary | ICD-10-CM

## 2022-01-17 DIAGNOSIS — R8290 Unspecified abnormal findings in urine: Secondary | ICD-10-CM | POA: Diagnosis not present

## 2022-01-17 LAB — POCT URINALYSIS DIP (CLINITEK)
Bilirubin, UA: NEGATIVE
Blood, UA: NEGATIVE
Glucose, UA: NEGATIVE mg/dL
Ketones, POC UA: NEGATIVE mg/dL
Nitrite, UA: NEGATIVE
POC PROTEIN,UA: NEGATIVE
Spec Grav, UA: 1.01 (ref 1.010–1.025)
Urobilinogen, UA: 0.2 E.U./dL
pH, UA: 5.5 (ref 5.0–8.0)
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
## 2022-01-17 NOTE — Patient Instructions (Signed)
Gabapentin up to three times a day for pain  Will refer to urology/gynecology at [facility]  Postherpetic Neuralgia Postherpetic neuralgia (PHN) is nerve pain that occurs after a shingles infection. Shingles is a painful rash that appears on one area of the body, usually on the trunk or face. Shingles is caused by the varicella-zoster virus. This is the same virus that causes chickenpox. In people who have had chickenpox, the virus can resurface years later and cause shingles. PHN appears in the same area where you had the shingles rash. The pain usually goes away after the rash disappears. You may have PHN if you continue to have pain 3 months after your shingles rash has gone away. What are the causes? This condition is caused by damage to your nerves due to inflammation from the varicella-zoster virus. The damage makes your nerves overly sensitive. What increases the risk? The following factors may make you more likely to develop this condition: Being older than 69 years of age. Having severe pain before your shingles rash starts. Having a severe rash. Having shingles in and around the eye area. Having a disease or taking medicine that causes you to have a weakened disease-fighting system (immune system). What are the signs or symptoms? The main symptom of this condition is pain. The pain may: Often be severe and may be described as stabbing, burning, shooting, or feeling like an electric shock. Come and go, or it may be there all the time. Be triggered by light touches on the skin or changes in temperature. You may have itching along with the pain. How is this diagnosed? This condition may be diagnosed based on your symptoms and your history of shingles. Lab studies and other diagnostic tests are usually not needed. How is this treated? There is no cure for this condition. Treatment for PHN will focus on pain relief. Over-the-counter pain relievers do not usually relieve PHN pain. You may need  to work with a pain specialist. Treatment may include: Anti-seizure medicines to relieve nerve pain. Antidepressant medicines to help with pain and improve sleep. A numbing patch worn on the skin (lidocaine patch). Strong pain relievers (opioids). Injections of numbing medicine or anti-inflammatory medicines around irritated nerves. Injections of botulinum toxin to block pain signals between nerves and muscles. Follow these instructions at home: Medicines Take over-the-counter and prescription medicines only as told by your health care provider. Ask your health care provider if the medicine prescribed to you: Requires you to avoid driving or using machinery. Can cause constipation. You may need to take these actions to prevent or treat constipation: Drink enough fluid to keep your urine pale yellow. Take over-the-counter or prescription medicines. Eat foods that are high in fiber, such as beans, whole grains, and fresh fruits and vegetables. Limit foods that are high in fat and processed sugars, such as fried or sweet foods. Managing pain  If directed, put ice on the painful area. To do this: Put ice in a plastic bag. Place a towel between your skin and the bag. Leave the ice on for 20 minutes, 2-3 times a day. Remove the ice if your skin turns bright red. This is very important. If you cannot feel pain, heat, or cold, you have a greater risk of damage to the area. Cover sensitive areas with a bandage (dressing) to reduce friction from clothing rubbing on the area. General instructions It may take a long time to recover from PHN. Work closely with your health care provider and develop a  good support system at home. You may consider joining a support group. Wear loose, comfortable clothing. Talk to your health care provider if you feel depressed or desperate. Living with long-term pain can be depressing. Keep all follow-up visits. This is important. How is this prevented? Getting a  vaccination for shingles can prevent PHN. The shingles vaccine is recommended for people older than age 29. It may prevent shingles and may also lower your risk of PHN if you do get shingles. Contact a health care provider if: Your medicine is not helping. You are struggling to manage your pain at home. Get help right away if: You have thoughts about hurting yourself or others. If you ever feel like you may hurt yourself or others, or have thoughts about taking your own life, get help right away. Go to your nearest emergency department or: Call your local emergency services (911 in the [place] Call a suicide crisis helpline, such as the [place] at 401 487 1987 or 988 in the [place]. This is open 24 hours a day in the [place] Text the Crisis Text Line at 9791220994 (in the [place].). Summary Postherpetic neuralgia (PHN) is a very painful disorder that can occur after an episode of shingles. The pain is often severe and may be described as stabbing, burning, shooting, or feeling like an electric shock. Prescription medicines can be helpful in managing persistent pain. Getting a vaccination for shingles can prevent PHN. This vaccine is recommended for people older than age 48. This information is not intended to replace advice given to you by your health care provider. Make sure you discuss any questions you have with your health care provider. Document Revised: 02/22/2021 Document Reviewed: 07/25/2020 Elsevier Patient Education  [place].

## 2022-01-20 LAB — URINE CULTURE
MICRO NUMBER:: 13500765
SPECIMEN QUALITY:: ADEQUATE

## 2022-01-21 ENCOUNTER — Encounter: Payer: Self-pay | Admitting: Physician Assistant

## 2022-01-21 DIAGNOSIS — J302 Other seasonal allergic rhinitis: Secondary | ICD-10-CM

## 2022-01-22 ENCOUNTER — Encounter: Payer: Self-pay | Admitting: Physician Assistant

## 2022-01-22 NOTE — Progress Notes (Signed)
Have you been called for appt with uro-gynecology yet? Colony count is back up. How are your symptoms?

## 2022-01-23 NOTE — Telephone Encounter (Signed)
Please send patient the number so that she can call them and bug them about appt.

## 2022-02-01 ENCOUNTER — Ambulatory Visit (INDEPENDENT_AMBULATORY_CARE_PROVIDER_SITE_OTHER): Payer: Medicare Other | Admitting: Physician Assistant

## 2022-02-01 VITALS — BP 128/66 | HR 78 | Ht 65.0 in | Wt 137.0 lb

## 2022-02-01 DIAGNOSIS — Z0000 Encounter for general adult medical examination without abnormal findings: Secondary | ICD-10-CM | POA: Diagnosis not present

## 2022-02-01 DIAGNOSIS — R252 Cramp and spasm: Secondary | ICD-10-CM

## 2022-02-01 DIAGNOSIS — E538 Deficiency of other specified B group vitamins: Secondary | ICD-10-CM | POA: Diagnosis not present

## 2022-02-01 DIAGNOSIS — R8271 Bacteriuria: Secondary | ICD-10-CM

## 2022-02-01 DIAGNOSIS — K5904 Chronic idiopathic constipation: Secondary | ICD-10-CM | POA: Diagnosis not present

## 2022-02-01 DIAGNOSIS — R140 Abdominal distension (gaseous): Secondary | ICD-10-CM | POA: Diagnosis not present

## 2022-02-01 DIAGNOSIS — E559 Vitamin D deficiency, unspecified: Secondary | ICD-10-CM

## 2022-02-01 DIAGNOSIS — B0229 Other postherpetic nervous system involvement: Secondary | ICD-10-CM

## 2022-02-01 MED ORDER — CYANOCOBALAMIN 1000 MCG/ML IJ SOLN
1000.0000 ug | Freq: Once | INTRAMUSCULAR | Status: AC
  Administered 2022-02-01: 1000 ug via INTRAMUSCULAR

## 2022-02-01 MED ORDER — LUBIPROSTONE 8 MCG PO CAPS: 8.0000 ug | ORAL_CAPSULE | Freq: Two times a day (BID) | ORAL | 2 refills | Status: DC

## 2022-02-01 NOTE — Progress Notes (Signed)
Subjective:     Jamie [name] is a 71 y.o. female and is here for a comprehensive physical exam. 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 Gabapentin does help. She continues to have intermittent UTI symptoms. Known bacteruria. She has appt with uro-gynocologist for consult. No fever, chills, body aches. She is very concerned and wants all labs to look for anything that could be going on. Or worsening infection  Continues to struggle with constipation.never tried [place].   Social History   Socioeconomic History   Marital status: Single    Spouse name: Not on file   Number of children: 3   Years of education: 14   Highest education level: Associate degree: academic program  Occupational History   Occupation: Consulting civil engineer   Occupation: retired.  Tobacco Use   Smoking status: Former    Packs/day: 0.80    Years: 20.00    Total pack years: 16.00    Types: Cigarettes    Quit date: 08/13/1985    Years since quitting: 36.4   Smokeless tobacco: Never  Vaping Use   Vaping Use: Never used  Substance and Sexual Activity   Alcohol use: Yes    Comment: rare   Drug use: No   Sexual activity: Not Currently    Partners: Male    Birth control/protection: None  Other Topics Concern   Not on file  Social History Narrative   Her daughter is staying with her since her surgery. She enjoys shopping, exercise (when she can) and enjoys word finder.    Social Determinants of Health   Financial Resource Strain: Low Risk  (10/13/2021)   Overall Financial Resource Strain (CARDIA)    Difficulty of Paying Living Expenses: Not hard at all  Food Insecurity: No Food Insecurity (10/13/2021)   Hunger Vital Sign    Worried About Running Out of Food in the Last Year: Never true    Ran Out of Food in the Last Year: Never true  Transportation Needs: No Transportation Needs (10/13/2021)   PRAPARE - Administrator, Civil Service (Medical): No    Lack of  Transportation (Non-Medical): No  Physical Activity: Inactive (10/13/2021)   Exercise Vital Sign    Days of Exercise per Week: 0 days    Minutes of Exercise per Session: 0 min  Stress: No Stress Concern Present (10/13/2021)   [employer] of Occupational Health - Occupational Stress Questionnaire    Feeling of Stress : Not at all  Social Connections: Moderately Isolated (10/13/2021)   Social Connection and Isolation Panel [NHANES]    Frequency of Communication with Friends and Family: More than three times a week    Frequency of Social Gatherings with Friends and Family: Once a week    Attends Religious Services: More than 4 times per year    Active Member of [employer] or Organizations: No    Attends Banker Meetings: Never    Marital Status: Divorced  Catering manager Violence: Not At Risk (10/13/2021)   Humiliation, Afraid, Rape, and Kick questionnaire    Fear of Current or Ex-Partner: No    Emotionally Abused: No    Physically Abused: No    Sexually Abused: No   Health Maintenance  Topic Date Due   TETANUS/TDAP  02/10/2022 (Originally 08/14/2015)   Zoster Vaccines- Shingrix (1 of 2) 05/04/2022 (Originally 12/20/1969)   Pneumonia Vaccine 17+ Years old (1 - PCV) 06/21/2022 (Originally 12/19/2017)   INFLUENZA VACCINE  03/13/2022  MAMMOGRAM  04/12/2022   DEXA SCAN  12/15/2022   COLONOSCOPY (Pts 45-26yrs Insurance coverage will need to be confirmed)  03/21/2027   Hepatitis C Screening  Completed   HPV VACCINES  Aged Out   COVID-19 Vaccine  Discontinued    The following portions of the patient's history were reviewed and updated as appropriate: allergies, current medications, past family history, past medical history, past social history, past surgical history, and problem list.  Review of Systems Pertinent items noted in HPI and remainder of comprehensive ROS otherwise negative.   Objective:    BP 128/66   Pulse 78   Ht 5\' 5"  (1.651 m)   Wt 137 lb (62.1 kg)   SpO2 100%    BMI 22.80 kg/m  General appearance: alert, cooperative, and appears stated age Head: Normocephalic, without obvious abnormality, atraumatic Eyes: conjunctivae/corneas clear. PERRL, EOM's intact. Fundi benign. Ears: normal TM's and external ear canals both ears Nose: Nares normal. Septum midline. Mucosa normal. No drainage or sinus tenderness. Throat: lips, mucosa, and tongue normal; teeth and gums normal Neck: no adenopathy, no carotid bruit, no JVD, supple, symmetrical, trachea midline, and thyroid not enlarged, symmetric, no tenderness/mass/nodules Back: symmetric, no curvature. ROM normal. No CVA tenderness. Lungs: clear to auscultation bilaterally Heart: regular rate and rhythm, S1, S2 normal, no murmur, click, rub or gallop Abdomen: soft, non-tender; bowel sounds normal; no masses,  no organomegaly Extremities: extremities normal, atraumatic, no cyanosis or edema Pulses: 2+ and symmetric Skin: Skin color, texture, turgor normal. No rashes or lesions Lymph nodes: Cervical, supraclavicular, and axillary nodes normal. Neurologic: Alert and oriented X 3, normal strength and tone. Normal symmetric reflexes. Normal coordination and gait   ..    02/01/2022   10:12 AM 11/29/2021   10:14 AM 10/13/2021   11:11 AM 02/16/2021   12:03 PM 02/02/2020    1:40 PM  Depression screen PHQ 2/9  Decreased Interest 1 2 0 1 0  Down, Depressed, Hopeless 1 0 0 1 0  PHQ - 2 Score 2 2 0 2 0  Altered sleeping 0 1  1 1   Tired, decreased energy 1 1  1  0  Change in appetite 0 0  1 0  Feeling bad or failure about yourself  0 0  1 0  Trouble concentrating 0 0  1 0  Moving slowly or fidgety/restless 0 0  0 0  Suicidal thoughts 0 0  0 0  PHQ-9 Score 3 4  7 1   Difficult doing work/chores Not difficult at all Not difficult at all  Somewhat difficult Not difficult at all   ..    02/01/2022   10:13 AM 11/29/2021   10:14 AM 02/16/2021   12:04 PM 02/02/2020    1:40 PM  GAD 7 : Generalized Anxiety Score  Nervous,  Anxious, on Edge 2 3 1 1   Control/stop worrying 2 1 1  0  Worry too much - different things 2 2 1  0  Trouble relaxing 1 1 1  0  Restless 0 0 1 0  Easily annoyed or irritable 1 1  1   Afraid - awful might happen 0 0 1 0  Total GAD 7 Score 8 8  2   Anxiety Difficulty Not difficult at all Not difficult at all Somewhat difficult Not difficult at all     Assessment:    Healthy female exam.      Plan:  Marland KitchenMarland KitchenSharayne was seen today for annual exam.  Diagnoses and all orders for this visit:  Routine physical  examination -     Lipid Panel w/reflex Direct LDL -     COMPLETE METABOLIC PANEL WITH GFR -     VITAMIN D 25 Hydroxy (Vit-D Deficiency, Fractures) -     CBC w/Diff/Platelet -     Sed Rate (ESR) -     C-reactive protein -     B12 and Folate Panel -     Culture, blood (single) w Reflex to ID Panel -     Thyroid Panel With TSH -     Fe+TIBC+Fer -     Magnesium -     Vitamin B1 -     Vitamin B6 -     Zinc  Abdominal bloating -     lubiprostone (AMITIZA) 8 MCG capsule; Take 1 capsule (8 mcg total) by mouth 2 (two) times daily with a meal.  Chronic idiopathic constipation -     lubiprostone (AMITIZA) 8 MCG capsule; Take 1 capsule (8 mcg total) by mouth 2 (two) times daily with a meal.  Bacteriuria -     CBC w/Diff/Platelet -     Sed Rate (ESR) -     C-reactive protein -     B12 and Folate Panel -     Culture, blood (single) w Reflex to ID Panel -     Urinalysis, Routine w reflex microscopic -     Urine Culture  Vitamin D deficiency -     VITAMIN D 25 Hydroxy (Vit-D Deficiency, Fractures)  Bilateral leg cramps -     CBC w/Diff/Platelet -     Sed Rate (ESR) -     C-reactive protein -     B12 and Folate Panel -     Culture, blood (single) w Reflex to ID Panel -     Fe+TIBC+Fer -     Magnesium -     Vitamin B1 -     Zinc  B12 deficiency -     cyanocobalamin ((VITAMIN B-12)) injection 1,000 mcg  Other orders -     MICROSCOPIC MESSAGE   .Marland Kitchen Discussed 150 minutes of  exercise a week.  Encouraged vitamin D 1000 units and Calcium 1300mg  or 4 servings of dairy a day.  Fasting labs ordered Urine sent for culture due to ongoing bacteruria-seen by ID and suggested no abx treatment unless symptomatic Mammogram UTD Pap not needed Colonoscopy UTD Declines covid, shingles,pneumonia/flu vaccine  Trial of amitiza for constipation and bloating  Discussed post herpetic pain Continue gabapentin take TID and we can increase  HO given   See After Visit Summary for Counseling Recommendations

## 2022-02-01 NOTE — Patient Instructions (Addendum)
Increase gabapentin to three times a day Start [employer] magnesium at bedtime  Postherpetic Neuralgia Postherpetic neuralgia (PHN) is nerve pain that occurs after a shingles infection. Shingles is a painful rash that appears on one area of the body, usually on the trunk or face. Shingles is caused by the varicella-zoster virus. This is the same virus that causes chickenpox. In people who have had chickenpox, the virus can resurface years later and cause shingles. PHN appears in the same area where you had the shingles rash. The pain usually goes away after the rash disappears. You may have PHN if you continue to have pain 3 months after your shingles rash has gone away. What are the causes? This condition is caused by damage to your nerves due to inflammation from the varicella-zoster virus. The damage makes your nerves overly sensitive. What increases the risk? The following factors may make you more likely to develop this condition: Being older than 69 years of age. Having severe pain before your shingles rash starts. Having a severe rash. Having shingles in and around the eye area. Having a disease or taking medicine that causes you to have a weakened disease-fighting system (immune system). What are the signs or symptoms? The main symptom of this condition is pain. The pain may: Often be severe and may be described as stabbing, burning, shooting, or feeling like an electric shock. Come and go, or it may be there all the time. Be triggered by light touches on the skin or changes in temperature. You may have itching along with the pain. How is this diagnosed? This condition may be diagnosed based on your symptoms and your history of shingles. Lab studies and other diagnostic tests are usually not needed. How is this treated? There is no cure for this condition. Treatment for PHN will focus on pain relief. Over-the-counter pain relievers do not usually relieve PHN pain. You may need to  work with a pain specialist. Treatment may include: Anti-seizure medicines to relieve nerve pain. Antidepressant medicines to help with pain and improve sleep. A numbing patch worn on the skin (lidocaine patch). Strong pain relievers (opioids). Injections of numbing medicine or anti-inflammatory medicines around irritated nerves. Injections of botulinum toxin to block pain signals between nerves and muscles. Follow these instructions at home: Medicines Take over-the-counter and prescription medicines only as told by your health care provider. Ask your health care provider if the medicine prescribed to you: Requires you to avoid driving or using machinery. Can cause constipation. You may need to take these actions to prevent or treat constipation: Drink enough fluid to keep your urine pale yellow. Take over-the-counter or prescription medicines. Eat foods that are high in fiber, such as beans, whole grains, and fresh fruits and vegetables. Limit foods that are high in fat and processed sugars, such as fried or sweet foods. Managing pain  If directed, put ice on the painful area. To do this: Put ice in a plastic bag. Place a towel between your skin and the bag. Leave the ice on for 20 minutes, 2-3 times a day. Remove the ice if your skin turns bright red. This is very important. If you cannot feel pain, heat, or cold, you have a greater risk of damage to the area. Cover sensitive areas with a bandage (dressing) to reduce friction from clothing rubbing on the area. General instructions It may take a long time to recover from PHN. Work closely with your health care provider and develop a good support  system at home. You may consider joining a support group. Wear loose, comfortable clothing. Talk to your health care provider if you feel depressed or desperate. Living with long-term pain can be depressing. Keep all follow-up visits. This is important. How is this prevented? Getting a  vaccination for shingles can prevent PHN. The shingles vaccine is recommended for people older than age 34. It may prevent shingles and may also lower your risk of PHN if you do get shingles. Contact a health care provider if: Your medicine is not helping. You are struggling to manage your pain at home. Get help right away if: You have thoughts about hurting yourself or others. If you ever feel like you may hurt yourself or others, or have thoughts about taking your own life, get help right away. Go to your nearest emergency department or: Call your local emergency services (911 in the [place] Call a suicide crisis helpline, such as the [place] at 703-566-7883 or 988 in the [place]. This is open 24 hours a day in the [place] Text the Crisis Text Line at 402-406-3505 (in the [place].). Summary Postherpetic neuralgia (PHN) is a very painful disorder that can occur after an episode of shingles. The pain is often severe and may be described as stabbing, burning, shooting, or feeling like an electric shock. Prescription medicines can be helpful in managing persistent pain. Getting a vaccination for shingles can prevent PHN. This vaccine is recommended for people older than age 81. This information is not intended to replace advice given to you by your health care provider. Make sure you discuss any questions you have with your health care provider. Document Revised: 02/22/2021 Document Reviewed: 07/25/2020 Elsevier Patient Education  [place].

## 2022-02-02 ENCOUNTER — Encounter: Payer: Self-pay | Admitting: Physician Assistant

## 2022-02-03 ENCOUNTER — Encounter: Payer: Self-pay | Admitting: Physician Assistant

## 2022-02-03 LAB — URINALYSIS, ROUTINE W REFLEX MICROSCOPIC
Bilirubin Urine: NEGATIVE
Glucose, UA: NEGATIVE
Hgb urine dipstick: NEGATIVE
Hyaline Cast: NONE SEEN /LPF
Ketones, ur: NEGATIVE
Nitrite: POSITIVE — AB
Protein, ur: NEGATIVE
RBC / HPF: NONE SEEN /HPF (ref 0–2)
Specific Gravity, Urine: 1.008 (ref 1.001–1.035)
Squamous Epithelial / HPF: NONE SEEN /HPF (ref <=5)
pH: 6.5 (ref 5.0–8.0)

## 2022-02-03 LAB — URINE CULTURE
MICRO NUMBER:: 13563446
SPECIMEN QUALITY:: ADEQUATE

## 2022-02-03 LAB — MICROSCOPIC MESSAGE

## 2022-02-05 ENCOUNTER — Encounter: Payer: Self-pay | Admitting: Physician Assistant

## 2022-02-05 ENCOUNTER — Other Ambulatory Visit: Payer: Self-pay | Admitting: Physician Assistant

## 2022-02-05 DIAGNOSIS — K5904 Chronic idiopathic constipation: Secondary | ICD-10-CM

## 2022-02-05 DIAGNOSIS — R140 Abdominal distension (gaseous): Secondary | ICD-10-CM

## 2022-02-05 NOTE — Telephone Encounter (Signed)
[name] reviewed results earlier today.  Please see comments.

## 2022-02-06 ENCOUNTER — Encounter: Payer: Self-pay | Admitting: Physician Assistant

## 2022-02-06 NOTE — Telephone Encounter (Signed)
See other MyChart message

## 2022-02-07 ENCOUNTER — Telehealth (INDEPENDENT_AMBULATORY_CARE_PROVIDER_SITE_OTHER): Payer: Medicare Other | Admitting: Physician Assistant

## 2022-02-07 ENCOUNTER — Encounter: Payer: Self-pay | Admitting: Physician Assistant

## 2022-02-07 ENCOUNTER — Telehealth: Payer: Self-pay | Admitting: Neurology

## 2022-02-07 DIAGNOSIS — R8271 Bacteriuria: Secondary | ICD-10-CM | POA: Diagnosis not present

## 2022-02-07 DIAGNOSIS — E039 Hypothyroidism, unspecified: Secondary | ICD-10-CM

## 2022-02-07 HISTORY — DX: Hypothyroidism, unspecified: E03.9

## 2022-02-07 LAB — COMPLETE METABOLIC PANEL WITH GFR
AG Ratio: 1.7 (calc) (ref 1.0–2.5)
ALT: 16 U/L (ref 6–29)
AST: 22 U/L (ref 10–35)
Albumin: 4.5 g/dL (ref 3.6–5.1)
Alkaline phosphatase (APISO): 76 U/L (ref 37–153)
BUN: 16 mg/dL (ref 7–25)
CO2: 27 mmol/L (ref 20–32)
Calcium: 9.5 mg/dL (ref 8.6–10.4)
Chloride: 102 mmol/L (ref 98–110)
Creat: 0.73 mg/dL (ref 0.50–1.05)
Globulin: 2.6 g/dL (calc) (ref 1.9–3.7)
Glucose, Bld: 80 mg/dL (ref 65–99)
Potassium: 4.3 mmol/L (ref 3.5–5.3)
Sodium: 139 mmol/L (ref 135–146)
Total Bilirubin: 0.7 mg/dL (ref 0.2–1.2)
Total Protein: 7.1 g/dL (ref 6.1–8.1)
eGFR: 89 mL/min/{1.73_m2} (ref >=60)

## 2022-02-07 LAB — CBC WITH DIFFERENTIAL/PLATELET
Absolute Monocytes: 442 cells/uL (ref 200–950)
Basophils Absolute: 41 cells/uL (ref 0–200)
Basophils Relative: 0.9 %
Eosinophils Absolute: 41 cells/uL (ref 15–500)
Eosinophils Relative: 0.9 %
HCT: 39.4 % (ref 35.0–45.0)
Hemoglobin: 13.6 g/dL (ref 11.7–15.5)
Lymphs Abs: 952 cells/uL (ref 850–3900)
MCH: 32 pg (ref 27.0–33.0)
MCHC: 34.5 g/dL (ref 32.0–36.0)
MCV: 92.7 fL (ref 80.0–100.0)
MPV: 10.9 fL (ref 7.5–12.5)
Monocytes Relative: 9.6 %
Neutro Abs: 3123 cells/uL (ref 1500–7800)
Neutrophils Relative %: 67.9 %
Platelets: 278 10*3/uL (ref 140–400)
RBC: 4.25 10*6/uL (ref 3.80–5.10)
RDW: 12.2 % (ref 11.0–15.0)
Total Lymphocyte: 20.7 %
WBC: 4.6 10*3/uL (ref 3.8–10.8)

## 2022-02-07 LAB — CULTURE, BLOOD (SINGLE)
MICRO NUMBER:: 13563949
Result:: NO GROWTH
SPECIMEN QUALITY:: ADEQUATE

## 2022-02-07 LAB — LIPID PANEL W/REFLEX DIRECT LDL
Cholesterol: 254 mg/dL — ABNORMAL HIGH (ref <200)
HDL: 73 mg/dL (ref >=50)
LDL Cholesterol (Calc): 150 mg/dL (calc) — ABNORMAL HIGH
Non-HDL Cholesterol (Calc): 181 mg/dL (calc) — ABNORMAL HIGH (ref <130)
Total CHOL/HDL Ratio: 3.5 (calc) (ref <5.0)
Triglycerides: 169 mg/dL — ABNORMAL HIGH (ref <150)

## 2022-02-07 LAB — VITAMIN B6: Vitamin B6: 30.1 ng/mL — ABNORMAL HIGH (ref 2.1–21.7)

## 2022-02-07 LAB — THYROID PANEL WITH TSH
Free Thyroxine Index: 2.2 (ref 1.4–3.8)
T3 Uptake: 26 % (ref 22–35)
T4, Total: 8.4 ug/dL (ref 5.1–11.9)
TSH: 3.33 mIU/L (ref 0.40–4.50)

## 2022-02-07 LAB — VITAMIN B1: Vitamin B1 (Thiamine): 14 nmol/L (ref 8–30)

## 2022-02-07 LAB — IRON,TIBC AND FERRITIN PANEL
%SAT: 32 % (calc) (ref 16–45)
Ferritin: 22 ng/mL (ref 16–288)
Iron: 124 ug/dL (ref 45–160)
TIBC: 383 mcg/dL (calc) (ref 250–450)

## 2022-02-07 LAB — SEDIMENTATION RATE: Sed Rate: 6 mm/h (ref 0–30)

## 2022-02-07 LAB — ZINC: Zinc: 74 ug/dL (ref 60–130)

## 2022-02-07 LAB — VITAMIN D 25 HYDROXY (VIT D DEFICIENCY, FRACTURES): Vit D, 25-Hydroxy: 43 ng/mL (ref 30–100)

## 2022-02-07 LAB — MAGNESIUM: Magnesium: 2.3 mg/dL (ref 1.5–2.5)

## 2022-02-07 LAB — C-REACTIVE PROTEIN: CRP: 0.7 mg/L (ref <8.0)

## 2022-02-07 LAB — B12 AND FOLATE PANEL
Folate: 10 ng/mL
Vitamin B-12: 293 pg/mL (ref 200–1100)

## 2022-02-07 MED ORDER — LEVOTHYROXINE SODIUM 75 MCG PO TABS: 75.0000 ug | ORAL_TABLET | Freq: Every day | ORAL | 1 refills | Status: DC

## 2022-02-07 MED ORDER — FOSFOMYCIN TROMETHAMINE 3 G PO PACK: 3.0000 g | PACK | Freq: Once | ORAL | 0 refills | Status: AC

## 2022-02-07 NOTE — Telephone Encounter (Signed)
Patient has been scheduled & notified.

## 2022-02-07 NOTE — Telephone Encounter (Signed)
Mychart message sent to patient.

## 2022-02-07 NOTE — Telephone Encounter (Signed)
-----   Message from [name], [place] sent at 02/07/2022  2:48 PM EDT ----- Please call patient and let her know I sent fosfomycin for UTI.

## 2022-02-07 NOTE — Telephone Encounter (Signed)
Patient advised of labs and will call back to schedule.

## 2022-02-07 NOTE — Progress Notes (Signed)
..  Virtual Visit via Telephone Note  I connected with Jamie [name] on 02/07/22 at 11:30 AM EDT by telephone and verified that I am speaking with the correct person using two identifiers.  Location: Patient: home Provider: clinic  .Marland KitchenParticipating in visit:  Patient: Jamie [name] Provider: [name] PA-C   I discussed the limitations, risks, security and privacy concerns of performing an evaluation and management service by telephone and the availability of in person appointments. I also discussed with the patient that there may be a patient responsible charge related to this service. The patient expressed understanding and agreed to proceed.   History of Present Illness: Pt wants to go over all labs and urine culture.     Observations/Objective: No acute distress anxious  Assessment and Plan: Marland KitchenMarland KitchenCharlesetta was seen today for advice only.  Diagnoses and all orders for this visit:  Bacteriuria -     fosfomycin (MONUROL) 3 g PACK; Take 3 g by mouth once for 1 dose.  Acquired hypothyroidism -     levothyroxine (SYNTHROID) 75 MCG tablet; Take 1 tablet (75 mcg total) by mouth daily before breakfast.   Reviewed all the labs with patient and went over results.   Pt has appt with uro-gyneocology July 17th Pt e.coli count has increased with more UTI like symptoms Culture shows lots of resistance Will treat with fosfomycin  Discussed TSH not being in the optimal range of 1-2 Pt agrees to increase levothyroxine to 79mg daily Recheck labs in 4-6 weeks  Avoid any b6 supplements and cut back on foods high in b6    Follow Up Instructions:    I discussed the assessment and treatment plan with the patient. The patient was provided an opportunity to ask questions and all were answered. The patient agreed with the plan and demonstrated an understanding of the instructions.   The patient was advised to call back or seek an in-person evaluation if the symptoms worsen or if the condition fails  to improve as anticipated.  I provided 20 minutes of non-face-to-face time during this encounter.   JIran Planas PA-C

## 2022-02-07 NOTE — Progress Notes (Signed)
I would like to go over labs with phone call or virtual visit today. Ok to double book.

## 2022-02-07 NOTE — Progress Notes (Signed)
Patient has been scheduled & notified. 

## 2022-02-09 NOTE — Telephone Encounter (Signed)
Erroneous error

## 2022-02-14 ENCOUNTER — Encounter: Payer: Self-pay | Admitting: Physician Assistant

## 2022-02-15 ENCOUNTER — Ambulatory Visit: Payer: Medicare Other

## 2022-02-15 ENCOUNTER — Telehealth: Payer: Self-pay

## 2022-02-15 ENCOUNTER — Encounter: Payer: Self-pay | Admitting: Physician Assistant

## 2022-02-15 DIAGNOSIS — R350 Frequency of micturition: Secondary | ICD-10-CM

## 2022-02-15 DIAGNOSIS — R140 Abdominal distension (gaseous): Secondary | ICD-10-CM

## 2022-02-15 DIAGNOSIS — R8271 Bacteriuria: Secondary | ICD-10-CM

## 2022-02-15 NOTE — Telephone Encounter (Signed)
Initiated Prior authorization [id number] Tromethamine 3GM packets Via: Covermymeds Case/Key:[id number] Status: Pending as of 02/15/8302 Reason: Notified Pt via: Mychart

## 2022-02-16 ENCOUNTER — Encounter: Payer: Self-pay | Admitting: Physician Assistant

## 2022-02-16 DIAGNOSIS — N63 Unspecified lump in unspecified breast: Secondary | ICD-10-CM

## 2022-02-16 NOTE — Progress Notes (Signed)
Waiting for culture but appears bacteria count is less than 2 weeks ago.

## 2022-02-16 NOTE — Telephone Encounter (Signed)
Patient dropped off card yesterday when she came in for labs that states she is scheduled at [facility] for diagnostic mgm and possible ultrasound. [facility] in [place], phone # [phone].   Patient had Diagnostic MGM and ultrasound two years ago for "multiple palpable areas of concern" but they found nothing and recommended screening mammograms.   Did you want to place order for diagnostic mammogram and possible ultrasound?

## 2022-02-17 LAB — URINALYSIS, ROUTINE W REFLEX MICROSCOPIC
Bilirubin Urine: NEGATIVE
Glucose, UA: NEGATIVE
Hgb urine dipstick: NEGATIVE
Hyaline Cast: NONE SEEN /LPF
Ketones, ur: NEGATIVE
Nitrite: POSITIVE — AB
Protein, ur: NEGATIVE
RBC / HPF: NONE SEEN /HPF (ref 0–2)
Specific Gravity, Urine: 1.012 (ref 1.001–1.035)
Squamous Epithelial / HPF: NONE SEEN /HPF (ref <=5)
pH: 5.5 (ref 5.0–8.0)

## 2022-02-17 LAB — URINE CULTURE
MICRO NUMBER:: 13615204
SPECIMEN QUALITY:: ADEQUATE

## 2022-02-19 NOTE — Telephone Encounter (Signed)
She is already scheduled right?

## 2022-02-20 NOTE — Telephone Encounter (Signed)
No, looking in their system she is not scheduled. I think she wrote on the card and gave to [place] to ask you to order.

## 2022-02-20 NOTE — Telephone Encounter (Signed)
Ordered

## 2022-02-20 NOTE — Telephone Encounter (Signed)
Ok for orders for [facility].

## 2022-02-27 DIAGNOSIS — N958 Other specified menopausal and perimenopausal disorders: Secondary | ICD-10-CM

## 2022-02-27 DIAGNOSIS — R8290 Unspecified abnormal findings in urine: Secondary | ICD-10-CM

## 2022-02-27 DIAGNOSIS — N816 Rectocele: Secondary | ICD-10-CM | POA: Insufficient documentation

## 2022-02-27 DIAGNOSIS — M6289 Other specified disorders of muscle: Secondary | ICD-10-CM

## 2022-02-27 DIAGNOSIS — R3989 Other symptoms and signs involving the genitourinary system: Secondary | ICD-10-CM | POA: Insufficient documentation

## 2022-02-27 HISTORY — DX: Other specified disorders of muscle: M62.89

## 2022-02-27 HISTORY — DX: Rectocele: N81.6

## 2022-02-27 HISTORY — DX: Other symptoms and signs involving the genitourinary system: R39.89

## 2022-02-27 HISTORY — DX: Other specified menopausal and perimenopausal disorders: N95.8

## 2022-02-27 HISTORY — DX: Unspecified abnormal findings in urine: R82.90

## 2022-02-28 ENCOUNTER — Encounter: Payer: Self-pay | Admitting: Physician Assistant

## 2022-03-01 ENCOUNTER — Telehealth: Payer: Self-pay | Admitting: Neurology

## 2022-03-01 NOTE — Telephone Encounter (Signed)
REFERRAL ALREADY MADE BY UROLOGY.     Message    ----- Message -----  From: Donnamarie Poag"  Sent: 03/01/2022   8:11 AM EDT  To: Eliezer Champagne Pool  Subject: Appointment Request                              Pelvic floor     Appointment Request (Newest Message First) Coralee Pesa S routed conversation to You 2 hours ago (8:28 AM)   [name] "Helen"  [facility] Pool 2 hours ago (8:11 AM)    Pelvic floor    Bearden "Bonnita Nasuti" 2 hours ago (8:10 AM)    What kind of physical therapy are you requesting?     Breeback, Kyrielle Urbanski L, PA-C  You 18 hours ago (4:21 PM)    [place] for referral but confirm what she is wanting PT on ?        You  Breeback, Royetta Car, PA-C Yesterday (9:19 AM)    Faythe Ghee to order?       Coralee Pesa S routed conversation to [employer] (8:15 AM)   [name] "Helen"  [facility] Admin Pool Yesterday (7:07 AM)    Appointment Request From: [name]   With Provider: [name], PA-C [facility]   Preferred Date Range: Any   Preferred Times: Any Time   Reason for visit: Office Visit   Comments: I need to go for physical therapy as soon as possible. I would like to do it in [place] if possible. Could you set me up as soon as possible.

## 2022-03-06 ENCOUNTER — Encounter: Payer: Self-pay | Admitting: Physician Assistant

## 2022-03-06 NOTE — Telephone Encounter (Signed)
I have no idea.

## 2022-03-12 ENCOUNTER — Encounter: Payer: Self-pay | Admitting: Physician Assistant

## 2022-03-14 ENCOUNTER — Other Ambulatory Visit: Payer: Self-pay

## 2022-03-14 ENCOUNTER — Encounter: Payer: Self-pay | Admitting: Physician Assistant

## 2022-03-14 DIAGNOSIS — R300 Dysuria: Secondary | ICD-10-CM

## 2022-03-18 LAB — URINALYSIS, ROUTINE W REFLEX MICROSCOPIC
Bacteria, UA: NONE SEEN /HPF
Bilirubin Urine: NEGATIVE
Glucose, UA: NEGATIVE
Hgb urine dipstick: NEGATIVE
Hyaline Cast: NONE SEEN /LPF
Ketones, ur: NEGATIVE
Nitrite: NEGATIVE
Protein, ur: NEGATIVE
RBC / HPF: NONE SEEN /HPF (ref 0–2)
Specific Gravity, Urine: 1.004 (ref 1.001–1.035)
Squamous Epithelial / HPF: NONE SEEN /HPF (ref <=5)
pH: 6 (ref 5.0–8.0)

## 2022-03-18 LAB — URINE CULTURE
MICRO NUMBER:: 13739368
SPECIMEN QUALITY:: ADEQUATE

## 2022-03-21 ENCOUNTER — Encounter: Payer: Self-pay | Admitting: Physician Assistant

## 2022-03-23 ENCOUNTER — Encounter: Payer: Self-pay | Admitting: Physician Assistant

## 2022-03-23 NOTE — Telephone Encounter (Signed)
Jamie [name] ordered it through the urologist so the results were sent to them.

## 2022-03-26 ENCOUNTER — Encounter: Payer: Self-pay | Admitting: Physician Assistant

## 2022-03-27 ENCOUNTER — Encounter: Payer: Self-pay | Admitting: Physician Assistant

## 2022-03-28 ENCOUNTER — Encounter: Payer: Self-pay | Admitting: Physician Assistant

## 2022-03-28 DIAGNOSIS — N390 Urinary tract infection, site not specified: Secondary | ICD-10-CM

## 2022-04-02 ENCOUNTER — Encounter: Payer: Self-pay | Admitting: Physician Assistant

## 2022-04-03 ENCOUNTER — Encounter: Payer: Self-pay | Admitting: Physician Assistant

## 2022-04-03 DIAGNOSIS — D72819 Decreased white blood cell count, unspecified: Secondary | ICD-10-CM

## 2022-04-03 NOTE — Progress Notes (Signed)
Urine culture still pending.  WBC/RBC and hematocrit is low likely just a normal ebb and flow but need to recheck in 2 weeks to watch and see if trending down.

## 2022-04-05 LAB — CBC WITH DIFFERENTIAL/PLATELET
Absolute Monocytes: 391 cells/uL (ref 200–950)
Basophils Absolute: 20 cells/uL (ref 0–200)
Basophils Relative: 0.6 %
Eosinophils Absolute: 31 cells/uL (ref 15–500)
Eosinophils Relative: 0.9 %
HCT: 34.9 % — ABNORMAL LOW (ref 35.0–45.0)
Hemoglobin: 12.3 g/dL (ref 11.7–15.5)
Lymphs Abs: 884 cells/uL (ref 850–3900)
MCH: 32.5 pg (ref 27.0–33.0)
MCHC: 35.2 g/dL (ref 32.0–36.0)
MCV: 92.3 fL (ref 80.0–100.0)
MPV: 10.6 fL (ref 7.5–12.5)
Monocytes Relative: 11.5 %
Neutro Abs: 2074 cells/uL (ref 1500–7800)
Neutrophils Relative %: 61 %
Platelets: 225 10*3/uL (ref 140–400)
RBC: 3.78 10*6/uL — ABNORMAL LOW (ref 3.80–5.10)
RDW: 11.7 % (ref 11.0–15.0)
Total Lymphocyte: 26 %
WBC: 3.4 10*3/uL — ABNORMAL LOW (ref 3.8–10.8)

## 2022-04-05 LAB — URINE CULTURE
MICRO NUMBER:: 13807904
SPECIMEN QUALITY:: ADEQUATE

## 2022-04-07 ENCOUNTER — Other Ambulatory Visit: Payer: Self-pay | Admitting: Physician Assistant

## 2022-04-07 DIAGNOSIS — E039 Hypothyroidism, unspecified: Secondary | ICD-10-CM

## 2022-04-10 ENCOUNTER — Encounter: Payer: Self-pay | Admitting: Physician Assistant

## 2022-04-11 ENCOUNTER — Encounter: Payer: Self-pay | Admitting: Physician Assistant

## 2022-04-11 NOTE — Telephone Encounter (Signed)
I don't know what it is she is asking for.

## 2022-04-11 NOTE — Telephone Encounter (Signed)
Patient has been added to your schedule on Friday at 11 am. Patient was notified via [place].

## 2022-04-12 ENCOUNTER — Encounter: Payer: Self-pay | Admitting: Physician Assistant

## 2022-04-12 ENCOUNTER — Ambulatory Visit: Payer: Medicare Other | Admitting: Rehabilitative and Restorative Service Providers"

## 2022-04-12 LAB — CBC WITH DIFFERENTIAL/PLATELET
Absolute Monocytes: 646 cells/uL (ref 200–950)
Basophils Absolute: 61 cells/uL (ref 0–200)
Basophils Relative: 0.8 %
Eosinophils Absolute: 23 cells/uL (ref 15–500)
Eosinophils Relative: 0.3 %
HCT: 37.9 % (ref 35.0–45.0)
Hemoglobin: 13.1 g/dL (ref 11.7–15.5)
Lymphs Abs: 996 cells/uL (ref 850–3900)
MCH: 31.5 pg (ref 27.0–33.0)
MCHC: 34.6 g/dL (ref 32.0–36.0)
MCV: 91.1 fL (ref 80.0–100.0)
MPV: 10.5 fL (ref 7.5–12.5)
Monocytes Relative: 8.5 %
Neutro Abs: 5875 cells/uL (ref 1500–7800)
Neutrophils Relative %: 77.3 %
Platelets: 275 10*3/uL (ref 140–400)
RBC: 4.16 10*6/uL (ref 3.80–5.10)
RDW: 11.8 % (ref 11.0–15.0)
Total Lymphocyte: 13.1 %
WBC: 7.6 10*3/uL (ref 3.8–10.8)

## 2022-04-13 ENCOUNTER — Ambulatory Visit (INDEPENDENT_AMBULATORY_CARE_PROVIDER_SITE_OTHER): Payer: Medicare Other | Admitting: Physician Assistant

## 2022-04-13 ENCOUNTER — Encounter: Payer: Self-pay | Admitting: Physician Assistant

## 2022-04-13 VITALS — BP 121/65 | HR 88 | Wt 135.0 lb

## 2022-04-13 DIAGNOSIS — B0229 Other postherpetic nervous system involvement: Secondary | ICD-10-CM

## 2022-04-13 DIAGNOSIS — K5904 Chronic idiopathic constipation: Secondary | ICD-10-CM | POA: Diagnosis not present

## 2022-04-13 DIAGNOSIS — R195 Other fecal abnormalities: Secondary | ICD-10-CM | POA: Diagnosis not present

## 2022-04-13 DIAGNOSIS — M25511 Pain in right shoulder: Secondary | ICD-10-CM | POA: Diagnosis not present

## 2022-04-13 DIAGNOSIS — M7521 Bicipital tendinitis, right shoulder: Secondary | ICD-10-CM

## 2022-04-13 DIAGNOSIS — R8271 Bacteriuria: Secondary | ICD-10-CM

## 2022-04-13 HISTORY — DX: Pain in right shoulder: M25.511

## 2022-04-13 HISTORY — DX: Bicipital tendinitis, right shoulder: M75.21

## 2022-04-13 MED ORDER — LINACLOTIDE 72 MCG PO CAPS: 72.0000 ug | ORAL_CAPSULE | Freq: Every day | ORAL | 0 refills | Status: DC

## 2022-04-13 MED ORDER — GABAPENTIN 100 MG PO CAPS: 100.0000 mg | ORAL_CAPSULE | Freq: Three times a day (TID) | ORAL | 1 refills | Status: DC

## 2022-04-13 NOTE — Patient Instructions (Signed)
Proximal Biceps Tendinitis and Tenosynovitis Rehab Ask your health care provider which exercises are safe for you. Do exercises exactly as told by your health care provider and adjust them as directed. It is normal to feel mild stretching, pulling, tightness, or discomfort as you do these exercises. Stop right away if you feel sudden pain or your pain gets worse. Do not begin these exercises until told by your health care provider. Stretching and range-of-motion exercises These exercises warm up your muscles and joints and improve the movement and flexibility of your arm and shoulder. The exercises also help to relieve pain and stiffness. Forearm rotation Stand or sit with your left / right elbow bent in a 90-degree angle (right angle). Position your forearm so that the thumb is facing the ceiling (neutral position). Rotate your palm up until it cannot go any farther. Hold this position for __________ seconds. Rotate your palm down until it cannot go any farther. Hold this position for __________ seconds. Repeat __________ times. Complete this exercise __________ times a day. Elbow range of motion Stand or sit with your left / right elbow bent in a 90-degree angle (right angle). Position your forearm so that the thumb is facing the ceiling (neutral position). Slowly bend your elbow as far as you can until you feel a stretch or cannot go any farther. Hold this position for __________ seconds. Slowly straighten your elbow as far as you can until you feel a stretch or cannot go any farther. Hold this position for __________ seconds. Repeat __________ times. Complete this exercise __________ times a day. Biceps stretch Stand facing a wall, or stand by a door frame. Raise your left / right arm out to your side, to your shoulder height. Place the thumb side of your hand against the wall. Your palm should be facing the floor (palm down). Keeping your arm straight, rotate your body in the opposite  direction of the raised arm until you feel a gentle stretch in your biceps. Hold this position for __________ seconds. Slowly return to the starting position. Repeat __________ times. Complete this exercise __________ times a day. Shoulder pendulum  Stand near a table or counter that you can hold onto for balance. Bend forward at the waist and let your left / right arm hang straight down. Use your other arm to support you and help you stay balanced. Relax your left / right arm and shoulder muscles, and move your hips and your trunk so your left / right arm swings freely. Your arm should swing because of the motion of your body, not because you are using your arm or shoulder muscles. Keep moving your hips and trunk so your arm swings in the following directions, as told by your health care provider: Side to side. Forward and backward. In clockwise and counterclockwise circles. Repeat __________ times. Complete this exercise __________ times a day. Shoulder flexion, assisted  Stand facing a wall. Put your left / right palm on the wall. Slowly move your left / right hand up the wall (flexion). Stop when you feel a stretch in your shoulder, or when you reach the angle that is recommended by your health care provider. Use your other hand to help raise your arm, if needed (assisted). As your hand gets higher, you may need to step closer to the wall. Avoid shrugging or lifting your shoulder up as you raise your arm. To do this, keep your shoulder blade tucked down toward your spine. Hold this position for __________ seconds.  Slowly return to the starting position. Use your other arm to help, if needed. Repeat __________ times. Complete this exercise __________ times a day. Shoulder flexion Stand with your left / right arm hanging down at your side. Keep your arm straight as you lift your arm forward and toward the ceiling (flexion). Hold this position for __________ seconds. Slowly return to the  starting position. Repeat __________ times. Complete this exercise __________ times a day. Sleeper stretch, assisted Lie on your left / right side (injured side) with your hips and knees bent and your left / right arm straight in front of you. Bend your elbow to a 90-degree angle (right angle), so your fingers are pointing to the ceiling. Use your other hand to gently push your arm toward the floor (assisted), stopping when you feel a gentle stretch. Keep your shoulder blades lightly squeezed together during the exercise. Hold this position for __________ seconds. Slowly return to the starting position. Repeat __________ times. Complete this exercise __________ times a day. Strengthening exercises These exercises build strength and endurance in your arm and shoulder. Endurance is the ability to use your muscles for a long time, even after they get tired. Biceps curls You can use a weight or an exercise band for this exercise. Sit on a stable chair without armrests, or stand up. Hold a __________ lb / kg weight in your left / right hand, or hold an exercise band with both hands. Your palms should face up toward the ceiling at the starting position. Bend your left / right elbow and move your hand up toward your shoulder. Keep your other arm straight down, in the starting position. Hold this position for __________ seconds. Slowly return to the starting position. Repeat __________ times. Complete this exercise __________ times a day. Internal shoulder rotation You will use an exercise band secured to a stable object at waist height for this exercise. A door and doorframe work well. Stand sideways next to a door with your left / right arm closest to the door, holding the exercise band in your hand. With your elbow bent in a 90-degree angle (right angle) and keeping your elbow at your side, bring your hand toward your belly (internal rotation). Make sure your wrist is staying straight as you do  this exercise. Hold this position for __________ seconds. Slowly return to the starting position. Repeat __________ times. Complete this exercise __________ times a day. External shoulder rotation You will use an exercise band secured to a stable object at waist height for this exercise. A door and doorframe work well. Stand sideways next to a door with your left / right arm away from the door, holding the exercise band in your hand. With your elbow bent in a 90-degree angle (right angle) and keeping your elbow at your side, swing your arm away from your body (external rotation). Make sure your wrist is staying straight as you do this exercise. Hold this position for __________ seconds. Slowly return to the starting position. Repeat __________ times. Complete this exercise __________ times a day. External shoulder rotation, side-lying You will use a weight to do this exercise. Lie on your uninjured side with your left / right arm at your side. Bend your elbow to a 90-degree angle (right angle). Hold a __________ lb / kg weight in your left / right hand. Keeping your elbow at your side, raise your arm toward the ceiling (external rotation). Make sure your wrist is staying straight as you do this exercise.  Hold this position for __________ seconds. Slowly return to the starting position. Repeat __________ times. Complete this exercise __________ times a day. Scapular retraction Scapular retraction is the process of pulling the shoulder blades (scapulae) toward each other, and toward the spine. You will need an exercise band to do this exercise. Sit in a stable chair without armrests, or stand up. Secure an exercise band to a stable object in front of you so the band is at shoulder height. Hold one end of the exercise band in each hand. Squeeze your shoulder blades together and move your elbows slightly behind you (retraction). Do not shrug your shoulders upward while you do this. Hold this  position for __________ seconds. Slowly return to the starting position. Repeat __________ times. Complete this exercise __________ times a day. Scapular protraction, supine Scapular protraction is the process of moving your shoulder blades away from each other, and away from the spine, while you lie on your back (supine position). Lie on your back on a firm surface. Hold a __________ lb / kg weight in your left / right hand. Raise your left / right arm straight into the air so your hand is directly above your shoulder joint. Push the weight into the air so your shoulder (scapula) lifts off the surface that you are lying on. Think of trying to punch the ceiling by only moving your scapula forward (protraction). Do not move your head, neck, or back. Hold this position for __________ seconds. Slowly return to the starting position. Repeat __________ times. Complete this exercise __________ times a day. This information is not intended to replace advice given to you by your health care provider. Make sure you discuss any questions you have with your health care provider. Document Revised: 09/21/2020 Document Reviewed: 09/21/2020 Elsevier Patient Education  [place].

## 2022-04-13 NOTE — Progress Notes (Signed)
CBC so much better! Very reassuring.
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
## 2022-04-24 ENCOUNTER — Encounter: Payer: Self-pay | Admitting: Physician Assistant

## 2022-04-26 ENCOUNTER — Encounter: Payer: Self-pay | Admitting: Physician Assistant

## 2022-04-26 DIAGNOSIS — K5904 Chronic idiopathic constipation: Secondary | ICD-10-CM

## 2022-04-27 MED ORDER — LINACLOTIDE 72 MCG PO CAPS: 72.0000 ug | ORAL_CAPSULE | Freq: Every day | ORAL | 0 refills | Status: DC

## 2022-05-01 ENCOUNTER — Emergency Department (HOSPITAL_BASED_OUTPATIENT_CLINIC_OR_DEPARTMENT_OTHER)
Admission: EM | Admit: 2022-05-01 | Discharge: 2022-05-01 | Disposition: A | Discharge status: Home / Self Care | Payer: Medicare Other | Attending: Emergency Medicine | Admitting: Emergency Medicine

## 2022-05-01 ENCOUNTER — Emergency Department (HOSPITAL_BASED_OUTPATIENT_CLINIC_OR_DEPARTMENT_OTHER): Payer: Medicare Other

## 2022-05-01 ENCOUNTER — Other Ambulatory Visit: Payer: Self-pay

## 2022-05-01 ENCOUNTER — Encounter (HOSPITAL_BASED_OUTPATIENT_CLINIC_OR_DEPARTMENT_OTHER): Payer: Self-pay | Admitting: Emergency Medicine

## 2022-05-01 ENCOUNTER — Encounter: Payer: Self-pay | Admitting: Physician Assistant

## 2022-05-01 DIAGNOSIS — R002 Palpitations: Secondary | ICD-10-CM | POA: Insufficient documentation

## 2022-05-01 DIAGNOSIS — R072 Precordial pain: Secondary | ICD-10-CM | POA: Diagnosis present

## 2022-05-01 DIAGNOSIS — R0602 Shortness of breath: Secondary | ICD-10-CM | POA: Insufficient documentation

## 2022-05-01 DIAGNOSIS — R110 Nausea: Secondary | ICD-10-CM | POA: Insufficient documentation

## 2022-05-01 DIAGNOSIS — R42 Dizziness and giddiness: Secondary | ICD-10-CM | POA: Insufficient documentation

## 2022-05-01 DIAGNOSIS — Z91010 Allergy to peanuts: Secondary | ICD-10-CM | POA: Insufficient documentation

## 2022-05-01 DIAGNOSIS — R531 Weakness: Secondary | ICD-10-CM | POA: Diagnosis not present

## 2022-05-01 DIAGNOSIS — Z20822 Contact with and (suspected) exposure to covid-19: Secondary | ICD-10-CM | POA: Insufficient documentation

## 2022-05-01 LAB — CBC
HCT: 38.6 % (ref 36.0–46.0)
Hemoglobin: 13.4 g/dL (ref 12.0–15.0)
MCH: 31.5 pg (ref 26.0–34.0)
MCHC: 34.7 g/dL (ref 30.0–36.0)
MCV: 90.8 fL (ref 80.0–100.0)
Platelets: 287 10*3/uL (ref 150–400)
RBC: 4.25 MIL/uL (ref 3.87–5.11)
RDW: 11.8 % (ref 11.5–15.5)
WBC: 5 10*3/uL (ref 4.0–10.5)
nRBC: 0 % (ref 0.0–0.2)

## 2022-05-01 LAB — D-DIMER, QUANTITATIVE: D-Dimer, Quant: 0.3 ug/mL-FEU (ref 0.00–0.50)

## 2022-05-01 LAB — COMPREHENSIVE METABOLIC PANEL
ALT: 28 U/L (ref 0–44)
AST: 31 U/L (ref 15–41)
Albumin: 4.3 g/dL (ref 3.5–5.0)
Alkaline Phosphatase: 57 U/L (ref 38–126)
Anion gap: 5 (ref 5–15)
BUN: 14 mg/dL (ref 8–23)
CO2: 30 mmol/L (ref 22–32)
Calcium: 9.1 mg/dL (ref 8.9–10.3)
Chloride: 104 mmol/L (ref 98–111)
Creatinine, Ser: 0.79 mg/dL (ref 0.44–1.00)
GFR, Estimated: >60 mL/min (ref >60)
Glucose, Bld: 99 mg/dL (ref 70–99)
Potassium: 4.4 mmol/L (ref 3.5–5.1)
Sodium: 139 mmol/L (ref 135–145)
Total Bilirubin: 0.8 mg/dL (ref 0.3–1.2)
Total Protein: 7.3 g/dL (ref 6.5–8.1)

## 2022-05-01 LAB — TROPONIN I (HIGH SENSITIVITY): Troponin I (High Sensitivity): <2 ng/L (ref <18)

## 2022-05-01 LAB — SARS CORONAVIRUS 2 BY RT PCR: SARS Coronavirus 2 by RT PCR: NEGATIVE

## 2022-05-01 NOTE — ED Provider Notes (Cosign Needed Addendum)
[facility] EMERGENCY DEPARTMENT Provider Note   CSN: [id number] Arrival date & time: 05/01/22  1050     History  Chief Complaint  Patient presents with   Shortness of Breath   Chest Pain    Jamie [name] is a 71 y.o. female.  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  Patient states that approximately 6 to 7 days ago, she developed shortness of breath that was worse with ambulation and activity.  She did not have associated fever, cough, URI symptoms.  About 3 days ago she developed pain in her chest, described as burning, right to left-sided.  No radiation, diaphoresis, vomiting.  She has associated generalized weakness and dizziness.  She has felt nauseated but has not vomited.  No abdominal pain. Patient denies risk factors for pulmonary embolism including: unilateral leg swelling, history of DVT/PE/other blood clots, use of exogenous hormones, recent immobilizations, recent surgery, recent travel (>4hr segment), malignancy, hemoptysis.          Home Medications Prior to Admission medications   Medication Sig Start Date End Date Taking? Authorizing Provider  albuterol (PROVENTIL HFA) 108 (90 Base) MCG/ACT inhaler Inhale 2 puffs into the lungs every 6 (six) hours as needed for wheezing or shortness of breath. 12/20/21   Breeback, Royetta Car, PA-C  ALPRAZolam (XANAX) 0.5 MG tablet Take 1 tablet (0.5 mg total) by mouth 2 (two) times daily as needed for sleep. 12/05/21   Breeback, [name] L, PA-C  b complex vitamins capsule Take 1 capsule by mouth daily.    [provider]  Blood Glucose Monitoring Suppl (ACCU-CHEK GUIDE ME) w/Device KIT Dx R73.09 Elevated glucose - Check blood sugar 4 times daily. 11/29/21   Breeback, Royetta Car, PA-C  cholecalciferol (VITAMIN D3) 25  MCG (1000 UNIT) tablet Take 1,000 Units by mouth daily.    [provider]  conjugated estrogens (PREMARIN) vaginal cream APPLY VAGINALLY 3 TIMES A WEEK AT NIGHT 02/16/20   Breeback, [name] L, PA-C  diclofenac (VOLTAREN) 75 MG EC tablet TAKE 1 TABLET(75 MG) BY MOUTH TWICE DAILY 07/24/21   Breeback, [name] L, PA-C  EPINEPHrine 0.3 mg/0.3 mL IJ SOAJ injection Inject 0.3 mg into the muscle as needed for anaphylaxis. 10/14/21   Deno Etienne, DO  gabapentin (NEURONTIN) 100 MG capsule Take 1 capsule (100 mg total) by mouth 3 (three) times daily. 04/13/22   Breeback, [name] L, PA-C  ibuprofen (ADVIL) 800 MG tablet Take 1 tablet (800 mg total) by mouth every 8 (eight) hours as needed. 06/02/21   Breeback, [name] L, PA-C  L-Methylfolate-B6-B12 (FOLTANX) 3-35-2 MG TABS Take 1 tablet by mouth in the morning and at bedtime. 02/02/20   Breeback, [name] L, PA-C  Lancets 30G MISC Dx R73.09 Elevated glucose - Check blood sugar 4 times daily. 08/31/20   Breeback, Royetta Car, PA-C  levothyroxine (SYNTHROID) 75 MCG tablet TAKE 1 TABLET(75 MCG) BY MOUTH DAILY BEFORE BREAKFAST 04/09/22   Breeback, [name] L, PA-C  lidocaine (LIDODERM) 5 % Place 1 patch onto the skin daily. Remove & Discard patch within 12 hours or as directed by MD 06/02/21   [name] L, PA-C  lidocaine (XYLOCAINE) 5 % ointment Apply 1 application topically daily. 06/02/21   [name], PA-C  linaclotide (LINZESS) 72 MCG capsule Take 1 capsule (72 mcg total) by mouth daily before breakfast. 04/27/22  Breeback, [name] L, PA-C  meclizine (ANTIVERT) 25 MG tablet Take 1 tablet (25 mg total) by mouth 3 (three) times daily as needed for dizziness. 08/17/20   Breeback, [name] L, PA-C  OVER THE COUNTER MEDICATION Take 2 capsules by mouth daily. Friendly four    [provider]  pantoprazole (PROTONIX) 40 MG tablet Take 40 mg by mouth in the morning and at bedtime. As needed    [provider]  PEPCID 20 MG tablet Take 1 tablet (20 mg total) by mouth 2 (two)  times daily. BRAND ONLY. Patient taking differently: Take 20 mg by mouth 2 (two) times daily as needed for heartburn or indigestion. BRAND ONLY. 08/15/21   [name], PA-C  promethazine (PHENERGAN) 25 MG tablet Take 1 tablet (25 mg total) by mouth every 6 (six) hours as needed for nausea or vomiting. 09/29/21   Breeback, Royetta Car, PA-C      Allergies    Bactrim [sulfamethoxazole-trimethoprim], Dairycare [lactase-lactobacillus], Iodine, Ciprofloxacin, Iodinated contrast media, Macrobid [nitrofurantoin], Peanut butter flavor, and Shellfish allergy    Review of Systems   Review of Systems  Physical Exam Updated Vital Signs BP 125/71   Pulse 80   Temp 98.3 F (36.8 C) (Oral)   Resp 13   Ht $R'5\' 4"'Fr$  (1.626 m)   Wt 61.7 kg   SpO2 100%   BMI 23.34 kg/m   Physical Exam Vitals and nursing note reviewed.  Constitutional:      Appearance: She is well-developed. She is not diaphoretic.  HENT:     Head: Normocephalic and atraumatic.     Mouth/Throat:     Mouth: Mucous membranes are moist. Mucous membranes are not dry.  Eyes:     Conjunctiva/sclera: Conjunctivae normal.  Neck:     Vascular: Normal carotid pulses. No JVD.     Trachea: Trachea normal. No tracheal deviation.     Comments: No carotid bruits heard. Cardiovascular:     Rate and Rhythm: Normal rate and regular rhythm.     Pulses: No decreased pulses.          Radial pulses are 2+ on the right side and 2+ on the left side.     Heart sounds: Normal heart sounds, S1 normal and S2 normal. No murmur heard. Pulmonary:     Effort: Pulmonary effort is normal. No respiratory distress.     Breath sounds: No wheezing, rhonchi or rales.  Chest:     Chest wall: No tenderness.  Abdominal:     General: Bowel sounds are normal.     Palpations: Abdomen is soft.     Tenderness: There is no abdominal tenderness. There is no guarding or rebound.  Musculoskeletal:        General: Normal range of motion.     Cervical back: Normal range of  motion and neck supple. No muscular tenderness.     Right lower leg: No tenderness. No edema.     Left lower leg: No tenderness. No edema.  Skin:    General: Skin is warm and dry.     Coloration: Skin is not pale.  Neurological:     Mental Status: She is alert.     ED Results / Procedures / Treatments   Labs (all labs ordered are listed, but only abnormal results are displayed) Labs Reviewed  SARS CORONAVIRUS 2 BY RT PCR  CBC  COMPREHENSIVE METABOLIC PANEL  D-DIMER, QUANTITATIVE  TROPONIN I (HIGH SENSITIVITY)    ED ECG REPORT   Date:  05/01/2022  Rate: 83  Rhythm: normal sinus rhythm  QRS Axis: normal  Intervals: normal  ST/T Wave abnormalities: normal  Conduction Disutrbances:none  Narrative Interpretation:   Old EKG Reviewed: No changes  I have personally reviewed the EKG tracing and agree with the computerized printout as noted.   Radiology DG Chest 2 View  Result Date: 05/01/2022 CLINICAL DATA:  Chest pain and nausea EXAM: CHEST - 2 VIEW COMPARISON:  12/20/2021 FINDINGS: Cardiac and mediastinal contours are within normal limits. No focal pulmonary opacity. No pleural effusion or pneumothorax. No acute osseous abnormality. IMPRESSION: No acute cardiopulmonary process. Electronically Signed   By: Merilyn Baba M.D.   On: 05/01/2022 11:39    Procedures Procedures    Medications Ordered in ED Medications - No data to display  ED Course/ Medical Decision Making/ A&P    Patient seen and examined. History obtained directly from patient.   Labs/EKG: Ordered CBC, BMP, troponin, EKG.  Added D-dimer, COVID testing.  Imaging: Ordered chest x-ray.  Medications/Fluids: None ordered  Most recent vital signs reviewed and are as follows: BP 125/71   Pulse 80   Temp 98.3 F (36.8 C) (Oral)   Resp 13   Ht $R'5\' 4"'at$  (1.626 m)   Wt 61.7 kg   SpO2 100%   BMI 23.34 kg/m   Initial impression: Shortness of breath, chest pain  2:05 PM Reassessment performed. Patient  appears stable, comfortable.  I saw her up walking in the hallway without any difficulty.  She was able to converse.  Per nurse tech, pulse rate was 94 and oxygen saturation was 99% with exertion.  Patient did feel some shortness of breath.  Labs personally reviewed and interpreted including: CBC normal; CMP normal; troponin negative; D-dimer negative.  COVID was negative.  Imaging personally visualized and interpreted including: Chest x-ray, agree negative.  Reviewed pertinent lab work and imaging with patient at bedside. Questions answered.   Most current vital signs reviewed and are as follows: BP 109/66   Pulse 78   Temp 98.3 F (36.8 C) (Oral)   Resp 15   Ht $R'5\' 4"'qq$  (1.626 m)   Wt 61.7 kg   SpO2 100%   BMI 23.34 kg/m   Plan: Discharge to home.  Will place ambulatory referral for cardiology on behalf of the patient.  Prescriptions written for: None  Return and follow-up instructions: I encouraged patient to return to ED with severe chest pain, especially if the pain is crushing or pressure-like and spreads to the arms, back, neck, or jaw, or if they have associated sweating, vomiting, or shortness of breath with the pain, or significant pain with activity. We discussed that the evaluation here today indicates a low-risk of serious cause of chest pain, including heart trouble or a blood clot, but no evaluation is perfect and chest pain can evolve with time. The patient verbalized understanding and agreed.  I encouraged patient to follow-up with their provider in the next 48 hours for recheck.                              Medical Decision Making Amount and/or Complexity of Data Reviewed Labs: ordered. Radiology: ordered.   For this patient's complaint of chest pain, the following emergent conditions were considered on the differential diagnosis: acute coronary syndrome, pulmonary embolism, pneumothorax, myocarditis, pericardial tamponade, aortic dissection, thoracic aortic aneurysm  complication, esophageal perforation.   Other causes were also considered including: gastroesophageal reflux disease,  musculoskeletal pain including costochondritis, pneumonia/pleurisy, herpes zoster, pericarditis.  In regards to possibility of ACS, patient has atypical features of pain, non-ischemic and unchanged EKG and negative troponin(s). Heart score was calculated to be 3.   In regards to possibility of PE, symptoms are atypical for PE and risk profile is low, d-dimer negative, making PE low likelihood.   The patient's vital signs, pertinent lab work and imaging were reviewed and interpreted as discussed in the ED course. Hospitalization was considered for further testing, treatments, or serial exams/observation. However as patient is well-appearing, has a stable exam, and reassuring studies today, I do not feel that they warrant admission at this time. This plan was discussed with the patient who verbalizes agreement and comfort with this plan and seems reliable and able to return to the Emergency Department with worsening or changing symptoms.     Final Clinical Impression(s) / ED Diagnoses Final diagnoses:  Precordial chest pain  Shortness of breath  Palpitations    Rx / DC Orders ED Discharge Orders          Ordered    Ambulatory referral to Cardiology       Comments: If you have not heard from the Cardiology office within the next 72 hours please call [phone].   05/01/22 1403               Carlisle Cater, PA-C 03/70/48 [id number]    Lianne Cure, DO 69/45/03 [phone]

## 2022-05-01 NOTE — ED Triage Notes (Signed)
Cp / sob and dizzy x 1 week , pt states drove her self to hospital and nauseated

## 2022-05-01 NOTE — Discharge Instructions (Signed)
Please read and follow all provided instructions.  Your diagnoses today include:  1. Precordial chest pain   2. Shortness of breath   3. Palpitations     Tests performed today include: An EKG of your heart A chest x-ray Cardiac enzymes - a blood test for heart muscle damage, with negative Blood counts and electrolytes Screening test for blood clot called D-dimer: Was negative Vital signs. See below for your results today.   Medications prescribed:  None  Take any prescribed medications only as directed.  Follow-up instructions: Please follow-up with your primary care provider as soon as you can for further evaluation of your symptoms.  I have also placed a referral to cardiology on your behalf.  Return instructions:  SEEK IMMEDIATE MEDICAL ATTENTION IF: You have severe chest pain, especially if the pain is crushing or pressure-like and spreads to the arms, back, neck, or jaw, or if you have sweating, nausea or vomiting, or trouble with breathing. THIS IS AN EMERGENCY. Do not wait to see if the pain will go away. Get medical help at once. Call 911. DO NOT drive yourself to the hospital.  Your chest pain gets worse and does not go away after a few minutes of rest.  You have an attack of chest pain lasting longer than what you usually experience.  You have significant dizziness, if you pass out, or have trouble walking.  You have chest pain not typical of your usual pain for which you originally saw your caregiver.  You have any other emergent concerns regarding your health.  Additional Information: Chest pain comes from many different causes. Your caregiver has diagnosed you as having chest pain that is not specific for one problem, but does not require admission.  You are at low risk for an acute heart condition or other serious illness.   Your vital signs today were: BP 122/74   Pulse 75   Temp 98.3 F (36.8 C) (Oral)   Resp 20   Ht '5\' 4"'$  (1.626 m)   Wt 61.7 kg   SpO2 100%    BMI 23.34 kg/m  If your blood pressure (BP) was elevated above 135/85 this visit, please have this repeated by your doctor within one month. --------------

## 2022-05-01 NOTE — ED Notes (Signed)
Pt's pulse while ambulating was 94 and O2 was 99%.

## 2022-05-02 ENCOUNTER — Telehealth: Payer: Self-pay | Admitting: General Practice

## 2022-05-02 NOTE — Telephone Encounter (Signed)
Transition Care Management Follow-up Telephone Call Date of discharge and from where: 05/01/22 from [facility] How have you been since you were released from the hospital? Still feeling weak and getting short of breath while walking. She checked her BP at home and it was elevated. She is having nausea as well.  Any questions or concerns? No  Items Reviewed: Did the pt receive and understand the discharge instructions provided? Yes  Medications obtained and verified? No  Other? No  Any new allergies since your discharge? No  Dietary orders reviewed? Yes Do you have support at home? Yes   Home Care and Equipment/Supplies: Were home health services ordered? no  Functional Questionnaire: (I = Independent and D = Dependent) ADLs: I  Bathing/Dressing- I  Meal Prep- I  Eating- I  Maintaining continence- I  Transferring/Ambulation- I  Managing Meds- I  Follow up appointments reviewed:  PCP Hospital f/u appt confirmed? Yes  Scheduled to see [name] PA on 05/07/22 @ 0950. [facility] f/u appt confirmed? No   Are transportation arrangements needed? No  If their condition worsens, is the pt aware to call PCP or go to the Emergency Dept.? Yes Was the patient provided with contact information for the PCP's office or ED? Yes Was to pt encouraged to call back with questions or concerns? Yes

## 2022-05-07 ENCOUNTER — Inpatient Hospital Stay: Payer: Medicare Other | Admitting: Physician Assistant

## 2022-05-07 ENCOUNTER — Ambulatory Visit: Payer: Medicare Other | Admitting: Cardiology

## 2022-05-08 ENCOUNTER — Encounter: Payer: Self-pay | Admitting: Physician Assistant

## 2022-05-08 ENCOUNTER — Inpatient Hospital Stay: Payer: Medicare Other | Admitting: Physician Assistant

## 2022-05-08 NOTE — Telephone Encounter (Signed)
Patient left vm about this as well. Okay to send referral to alternate Cardiologist?

## 2022-05-09 NOTE — Telephone Encounter (Signed)
Please send recent cardiology referral to new doc in note please.

## 2022-05-10 ENCOUNTER — Encounter: Payer: Self-pay | Admitting: Physician Assistant

## 2022-05-10 DIAGNOSIS — R072 Precordial pain: Secondary | ICD-10-CM

## 2022-05-17 ENCOUNTER — Other Ambulatory Visit: Payer: Self-pay

## 2022-05-17 ENCOUNTER — Emergency Department (HOSPITAL_BASED_OUTPATIENT_CLINIC_OR_DEPARTMENT_OTHER)
Admission: EM | Admit: 2022-05-17 | Discharge: 2022-05-17 | Disposition: A | Discharge status: Home / Self Care | Payer: Medicare Other | Attending: Emergency Medicine | Admitting: Emergency Medicine

## 2022-05-17 ENCOUNTER — Encounter: Payer: Self-pay | Admitting: Physician Assistant

## 2022-05-17 ENCOUNTER — Emergency Department (HOSPITAL_BASED_OUTPATIENT_CLINIC_OR_DEPARTMENT_OTHER): Payer: Medicare Other

## 2022-05-17 DIAGNOSIS — N390 Urinary tract infection, site not specified: Secondary | ICD-10-CM

## 2022-05-17 DIAGNOSIS — R079 Chest pain, unspecified: Secondary | ICD-10-CM | POA: Diagnosis present

## 2022-05-17 DIAGNOSIS — K219 Gastro-esophageal reflux disease without esophagitis: Secondary | ICD-10-CM | POA: Insufficient documentation

## 2022-05-17 DIAGNOSIS — R7303 Prediabetes: Secondary | ICD-10-CM | POA: Insufficient documentation

## 2022-05-17 DIAGNOSIS — R0600 Dyspnea, unspecified: Secondary | ICD-10-CM | POA: Insufficient documentation

## 2022-05-17 DIAGNOSIS — F419 Anxiety disorder, unspecified: Secondary | ICD-10-CM | POA: Insufficient documentation

## 2022-05-17 DIAGNOSIS — K5790 Diverticulosis of intestine, part unspecified, without perforation or abscess without bleeding: Secondary | ICD-10-CM | POA: Insufficient documentation

## 2022-05-17 DIAGNOSIS — H269 Unspecified cataract: Secondary | ICD-10-CM | POA: Insufficient documentation

## 2022-05-17 DIAGNOSIS — T7840XA Allergy, unspecified, initial encounter: Secondary | ICD-10-CM | POA: Insufficient documentation

## 2022-05-17 DIAGNOSIS — R8290 Unspecified abnormal findings in urine: Secondary | ICD-10-CM

## 2022-05-17 DIAGNOSIS — J189 Pneumonia, unspecified organism: Secondary | ICD-10-CM | POA: Insufficient documentation

## 2022-05-17 DIAGNOSIS — K589 Irritable bowel syndrome without diarrhea: Secondary | ICD-10-CM | POA: Insufficient documentation

## 2022-05-17 HISTORY — DX: Diverticulitis of large intestine with perforation and abscess without bleeding: K57.20

## 2022-05-17 HISTORY — DX: Migraine with aura, not intractable, with status migrainosus: G43.101

## 2022-05-17 HISTORY — DX: Unspecified asthma, uncomplicated: J45.909

## 2022-05-17 HISTORY — DX: Headache, unspecified: R51.9

## 2022-05-17 LAB — CBC
HCT: 37.6 % (ref 36.0–46.0)
Hemoglobin: 13.3 g/dL (ref 12.0–15.0)
MCH: 31.9 pg (ref 26.0–34.0)
MCHC: 35.4 g/dL (ref 30.0–36.0)
MCV: 90.2 fL (ref 80.0–100.0)
Platelets: 288 10*3/uL (ref 150–400)
RBC: 4.17 MIL/uL (ref 3.87–5.11)
RDW: 11.7 % (ref 11.5–15.5)
WBC: 5.1 10*3/uL (ref 4.0–10.5)
nRBC: 0 % (ref 0.0–0.2)

## 2022-05-17 LAB — BASIC METABOLIC PANEL
Anion gap: 7 (ref 5–15)
BUN: 18 mg/dL (ref 8–23)
CO2: 28 mmol/L (ref 22–32)
Calcium: 9.2 mg/dL (ref 8.9–10.3)
Chloride: 104 mmol/L (ref 98–111)
Creatinine, Ser: 0.86 mg/dL (ref 0.44–1.00)
GFR, Estimated: >60 mL/min (ref >60)
Glucose, Bld: 83 mg/dL (ref 70–99)
Potassium: 4.3 mmol/L (ref 3.5–5.1)
Sodium: 139 mmol/L (ref 135–145)

## 2022-05-17 LAB — TROPONIN I (HIGH SENSITIVITY)
Troponin I (High Sensitivity): <2 ng/L (ref <18)
Troponin I (High Sensitivity): <2 ng/L (ref <18)

## 2022-05-17 NOTE — ED Provider Notes (Signed)
[facility] EMERGENCY DEPARTMENT Provider Note   CSN: [id number] Arrival date & time: 05/17/22  1507     History Chief Complaint  Patient presents with   Chest Pain    HPI DINISHA CAI is a 71 y.o. female presenting for episodes of chest pain.  She is an otherwise healthy 71 year old female who has been having intermittent episodes of palpitations over the past 3 weeks.  She denies fevers or chills, nausea vomiting, syncope shortness of breath.  She has no other medical history.  She is otherwise ambulatory tolerating p.o. intake.  Denies any symptoms at this time.  Last episode yesterday.    Patient's recorded medical, surgical, social, medication list and allergies were reviewed in the Snapshot window as part of the initial history.   Review of Systems   Review of Systems  Constitutional:  Negative for chills and fever.  HENT:  Negative for ear pain and sore throat.   Eyes:  Negative for pain and visual disturbance.  Respiratory:  Negative for cough and shortness of breath.   Cardiovascular:  Positive for palpitations. Negative for chest pain.  Gastrointestinal:  Negative for abdominal pain and vomiting.  Genitourinary:  Negative for dysuria and hematuria.  Musculoskeletal:  Negative for arthralgias and back pain.  Skin:  Negative for color change and rash.  Neurological:  Negative for seizures and syncope.  All other systems reviewed and are negative.   Physical Exam Updated Vital Signs BP 95/69   Pulse 79   Temp 98.3 F (36.8 C)   Resp (!) 25   Ht '5\' 4"'$  (1.626 m)   Wt 62.1 kg   SpO2 97%   BMI 23.52 kg/m  Physical Exam Vitals and nursing note reviewed.  Constitutional:      General: She is not in acute distress.    Appearance: She is well-developed.  HENT:     Head: Normocephalic and atraumatic.  Eyes:     Conjunctiva/sclera: Conjunctivae normal.  Cardiovascular:     Rate and Rhythm: Normal rate and regular rhythm.     Heart sounds: No murmur  heard. Pulmonary:     Effort: Pulmonary effort is normal. No respiratory distress.     Breath sounds: Normal breath sounds.  Abdominal:     Palpations: Abdomen is soft.     Tenderness: There is no abdominal tenderness.  Musculoskeletal:        General: No swelling.     Cervical back: Neck supple.  Skin:    General: Skin is warm and dry.     Capillary Refill: Capillary refill takes less than 2 seconds.  Neurological:     Mental Status: She is alert.  Psychiatric:        Mood and Affect: Mood normal.      ED Course/ Medical Decision Making/ A&P    Procedures Procedures   Medications Ordered in ED Medications - No data to display Medical Decision Making: CHANTELE CORADO is a 71 y.o. female who presented to the ED today with palpitations, detailed above.  Based on patient's comorbidities, patient has a heart score of 3.    Patient's presentation is complicated by their history of advanced age.  Patient placed on continuous vitals and telemetry monitoring while in ED which was reviewed periodically.  Complete initial physical exam performed, notably the patient was HDS in NAD.   Reviewed and confirmed nursing documentation for past medical history, family history, social history.    Initial Assessment: With the patient's presentation  of left-sided chest pain, most likely diagnosis is musculoskeletal chest pain versus GERD, although ACS remains on the differential. Other diagnoses were considered including (but not limited to) pulmonary embolism, community-acquired pneumonia, aortic dissection, pneumothorax, underlying bony abnormality, anemia. These are considered less likely due to history of present illness and physical exam findings.    In particular, concerning pulmonary embolism: Patient is without  malignancy, recent surgery, history of DVT, or calf tenderness leading to a low risk Wells score. Aortic Dissection also reconsidered but seems less likely based on the location,  quality, onset, and severity of symptoms in this case. Patient has a lack of serious comorbidities for this condition including a lack of HTN or Smoking. Patient also has a lack of underlying history of AD or TAA.  This is most consistent with an acute life/limb threatening illness complicated by underlying chronic conditions.   Initial Plan: Evaluate for ACS with delta troponin and EKG evaluated as below  Evaluate for dissection, bony abnormality, or pneumonia with chest x-ray and screening laboratory evaluation including CBC, BMP  Further evaluation for pulmonary embolism not indicated at this time based on patient's Wells score and resolution of symptoms at this time.  Further evaluation for Thoracic Aortic Dissection not indicated at this time based on patient's clinical history and PE findings.   Initial Study Results: EKG was reviewed independently. Rate, rhythm, axis, intervals all examined and without medically relevant abnormality. ST segments without concerns for elevations.    Laboratory  Delta  troponin demonstratedNAA   CBC and BMP without obvious metabolic or inflammatory abnormalities requiring further evaluation   Radiology  DG Chest 2 View  Result Date: 05/17/2022 CLINICAL DATA:  Chest pain EXAM: CHEST - 2 VIEW COMPARISON:  05/01/2022 FINDINGS: Cardiac size is within normal limits. Increase in AP diameter of chest suggests possible COPD. There are no signs of pulmonary edema or new focal infiltrates. There is no pleural effusion or pneumothorax. IMPRESSION: No active cardiopulmonary disease. Electronically Signed   By: Elmer Picker M.D.   On: 05/17/2022 15:48   DG Chest 2 View  Result Date: 05/01/2022 CLINICAL DATA:  Chest pain and nausea EXAM: CHEST - 2 VIEW COMPARISON:  12/20/2021 FINDINGS: Cardiac and mediastinal contours are within normal limits. No focal pulmonary opacity. No pleural effusion or pneumothorax. No acute osseous abnormality. IMPRESSION: No acute  cardiopulmonary process. Electronically Signed   By: Merilyn Baba M.D.   On: 05/01/2022 11:39    Final Assessment and Plan: On repeat evaluation, patient's objective evaluation is grossly reassuring at this time.  She is ambulatory tolerating p.o. intake in no acute distress.  She has follow-up with cardiology in the morning.  Her hear score is 3 based on her troponins today.  I do not believe that further interventional evaluation this evening would be beneficial for the patient.  This sounds to be consistent with paroxysmal SVT given the racing heart rate and symptomatic palpitations with spontaneous resolution.  However patient will require spell capture and evaluation by subspecialty care provider in the a.m.  Patient otherwise stable for discharge at this time, discharged with no further acute events.          Clinical Impression:  1. Chest pain, unspecified type      Discharge   Final Clinical Impression(s) / ED Diagnoses Final diagnoses:  Chest pain, unspecified type    Rx / DC Orders ED Discharge Orders     None         Fordyce Lepak,  MD 05/17/22 2032

## 2022-05-17 NOTE — ED Triage Notes (Addendum)
Left sided chest pain that started today at rest. Pt denies radiation. Reports ongoing issues with heart palpitations and anxiety. Seen for same on 9/19

## 2022-05-17 NOTE — ED Notes (Signed)
Pt verbalizes understanding of discharge instructions. Opportunity for questioning and answers were provided. Pt discharged from ED to home with family.    

## 2022-05-18 ENCOUNTER — Other Ambulatory Visit: Payer: Self-pay | Admitting: Physician Assistant

## 2022-05-18 ENCOUNTER — Telehealth: Payer: Self-pay | Admitting: General Practice

## 2022-05-18 ENCOUNTER — Encounter: Payer: Self-pay | Admitting: Physician Assistant

## 2022-05-18 DIAGNOSIS — K5904 Chronic idiopathic constipation: Secondary | ICD-10-CM

## 2022-05-18 NOTE — Telephone Encounter (Signed)
Transition Care Management Unsuccessful Follow-up Telephone Call  Date of discharge and from where:  05/17/22 from [facility]  Attempts:  1st Attempt  Reason for unsuccessful TCM follow-up call:  No answer/busy

## 2022-05-21 ENCOUNTER — Ambulatory Visit: Payer: Medicare Other | Attending: Cardiology | Admitting: Cardiology

## 2022-05-21 NOTE — Telephone Encounter (Signed)
Transition Care Management Unsuccessful Follow-up Telephone Call  Date of discharge and from where:  05/17/22 from [facility]  Attempts:  2nd Attempt  Reason for unsuccessful TCM follow-up call:  No answer/busy

## 2022-05-22 ENCOUNTER — Telehealth: Payer: Self-pay

## 2022-05-22 NOTE — Telephone Encounter (Signed)
Pt stopped by to do her labs and requested that I ask [name] to give her a referral for a lung doctor. She stated that she needs it as soon as possible. She wanted to speak to [facility] and I told her that she was not here today. Please advise. Tvt

## 2022-05-23 NOTE — Telephone Encounter (Signed)
Called patient and she stated that she was in the hospital last Thursday at  [facility] and one of the doctor's there told her that she had COPD and needed a Pulmonologist. Yesterday she went to see a Echocardiologist and they recommend her to see a Pulmonologist. She stated that 2 people told her the same thing in 5 days so she thinks she may need to see one. She also stated that all her records should be in [place] as well. tvt

## 2022-05-23 NOTE — Telephone Encounter (Signed)
Transition Care Management Unsuccessful Follow-up Telephone Call  Date of discharge and from where:  05/17/22 from [facility]  Attempts:  3rd Attempt  Reason for unsuccessful TCM follow-up call:  No answer/busy

## 2022-05-24 ENCOUNTER — Encounter: Payer: Self-pay | Admitting: Physician Assistant

## 2022-05-24 NOTE — Progress Notes (Signed)
Culture is still pending

## 2022-05-25 LAB — URINALYSIS, ROUTINE W REFLEX MICROSCOPIC
Bilirubin Urine: NEGATIVE
Glucose, UA: NEGATIVE
Hgb urine dipstick: NEGATIVE
Ketones, ur: NEGATIVE
Nitrite: NEGATIVE
Protein, ur: NEGATIVE
RBC / HPF: NONE SEEN /HPF (ref 0–2)
Specific Gravity, Urine: 1.009 (ref 1.001–1.035)
Squamous Epithelial / HPF: NONE SEEN /HPF (ref <=5)
pH: 6 (ref 5.0–8.0)

## 2022-05-25 LAB — URINE CULTURE
MICRO NUMBER:: 14032533
SPECIMEN QUALITY:: ADEQUATE

## 2022-05-25 LAB — MICROSCOPIC MESSAGE

## 2022-05-26 ENCOUNTER — Encounter: Payer: Self-pay | Admitting: Physician Assistant

## 2022-05-28 ENCOUNTER — Encounter: Payer: Self-pay | Admitting: Physician Assistant

## 2022-05-28 ENCOUNTER — Other Ambulatory Visit: Payer: Self-pay | Admitting: Physician Assistant

## 2022-05-28 DIAGNOSIS — R0602 Shortness of breath: Secondary | ICD-10-CM

## 2022-05-28 DIAGNOSIS — R8271 Bacteriuria: Secondary | ICD-10-CM

## 2022-05-28 NOTE — Progress Notes (Signed)
Jamie [name],   I would really like your specialist to weigh in on this unless you are actively having fever, chills, flank pain, nausea/vomiting, dysuria to represent symptomatic UTI.

## 2022-06-04 ENCOUNTER — Encounter: Payer: Self-pay | Admitting: Physician Assistant

## 2022-06-05 ENCOUNTER — Encounter: Payer: Self-pay | Admitting: Physician Assistant

## 2022-06-08 ENCOUNTER — Encounter: Payer: Self-pay | Admitting: Physician Assistant

## 2022-06-11 ENCOUNTER — Other Ambulatory Visit: Payer: Self-pay

## 2022-06-11 ENCOUNTER — Encounter: Payer: Self-pay | Admitting: Infectious Disease

## 2022-06-11 ENCOUNTER — Ambulatory Visit (INDEPENDENT_AMBULATORY_CARE_PROVIDER_SITE_OTHER): Payer: Medicare Other | Admitting: Infectious Disease

## 2022-06-11 VITALS — BP 119/73 | HR 80 | Resp 16 | Ht 64.0 in | Wt 140.0 lb

## 2022-06-11 DIAGNOSIS — R002 Palpitations: Secondary | ICD-10-CM

## 2022-06-11 DIAGNOSIS — A499 Bacterial infection, unspecified: Secondary | ICD-10-CM

## 2022-06-11 DIAGNOSIS — Z1630 Resistance to unspecified antimicrobial drugs: Secondary | ICD-10-CM

## 2022-06-11 DIAGNOSIS — N321 Vesicointestinal fistula: Secondary | ICD-10-CM

## 2022-06-11 DIAGNOSIS — N8189 Other female genital prolapse: Secondary | ICD-10-CM

## 2022-06-11 DIAGNOSIS — M6289 Other specified disorders of muscle: Secondary | ICD-10-CM

## 2022-06-11 DIAGNOSIS — R8271 Bacteriuria: Secondary | ICD-10-CM | POA: Diagnosis not present

## 2022-06-11 HISTORY — DX: Bacteriuria: R82.71

## 2022-06-11 MED ORDER — FLUCONAZOLE 150 MG PO TABS: 150.0000 mg | ORAL_TABLET | Freq: Every day | ORAL | 0 refills | Status: DC

## 2022-06-11 NOTE — Progress Notes (Signed)
Reason for infectious disease consult: History of colonization with ESBL and recurrent persistent asymptomatic bacteriuria  Requesting physician: [name], PA  Subjective:    Patient ID: Jamie [name], female    DOB: [birth year], 71 y.o.   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 Robotic assisted low anterior resection with double stapled colorectal anastomosis 2. Takedown of colovesical fistula 3. Robotic lysis of adhesions x 90 minutes 4. Flexible sigmoidoscopy 5. Bilateral transversus abdominus plane (TAP) blocks  She states she was found to have another one that required surgery as well though I am having trouble finding this in our system.  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   She has received various courses of antibiotics but cotninues to have persistent organisms in the urine.  She currently lacks much at all to suggest significant pathology from these bacteria with no dysuria, suprapubic pain flank pain fevers chills nausea malaise.  She is being followed by [facility] as well as Uro gynecology at [facility].  She does have pelvic floor dysfunction. She has suffering from other symptoms including palpitations.  She is being seen by cardiology and said that they had suggested that the cardiac symptoms could be due to the E. coli in the urine which would be highly unusual outside the setting of sepsis.    Past Medical History:  Diagnosis Date   Abdominal bloating 08/23/2017   Abnormal finding on urinalysis 02/27/2022   Last Assessment & Plan:  Formatting of this note might be different from the original. 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   Antibiotic-resistant bacterial infection 11/20/2021   Anxiety    Anxiety about health 08/23/2017   ANXIETY DEPRESSION 06/09/2010   Qualifier: Diagnosis of  By: Inda Castle FNP, Melissa S    Asymptomatic bacteriuria 06/11/2022   Atypical chest pain 08/23/2017   B12 deficiency 06/13/2015   Bacteriuria 11/29/2021   Biceps tendonitis on right 04/13/2022   Bilateral headaches    Bilateral tinnitus 12/23/2020   Cardiovascular risk factor 05/08/2017   Carotid stenosis, asymptomatic, bilateral 05/08/2017   Carotid dopplers showed bilateral ICA stenosis 1-39 percent 2016 Minor carotid intimal thickening without significant atherosclerosis. No hemodynamically significant ICA stenosis. Degree of narrowing less than 50% bilaterally. 08/2015  1. Mild (1-49%) stenosis proximal right internal carotid artery secondary to heterogenous atherosclerotic plaque. 2. Mild (1-49%) stenosis proximal left internal car   Carpal tunnel syndrome 10/17/2015   Cataract    bilateral   Cervical radiculopathy 10/01/2018   Xray 09/2018: cervical DDD, disc height loss at C5 and C6.    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   Constipation 07/19/2015   DDD (degenerative disc disease), cervical 08/31/2015   Depression with anxiety 06/18/2015   Diverticulitis of large intestine with  abscess without bleeding    Diverticulosis    Diverticulosis of large intestine 07/03/2011   Dyspnea    Elevated random blood glucose level 11/29/2021   Epigastric pain 07/06/2019   ETD (Eustachian tube dysfunction), right 06/11/2018   Exposure to chemical inhalation  11/06/2016   Exposure to hepatitis C 06/21/2021   Exposure to TB 06/21/2021   Extrinsic asthma, unspecified    Family history of renal cancer 07/20/2015   Fatigue 01/15/2018   Former smoker 05/14/2016   GAD (generalized anxiety disorder) 10/12/2019   Gastritis and duodenitis 04/15/2017   Generalized abdominal pain 01/24/2014   Genitourinary syndrome of menopause 02/27/2022   Formatting of this note might be different from the original. On premarin cream  Last Assessment & Plan:  Formatting of this note might be different from the original. - continue premarin cream 2-3x per week   GERD (gastroesophageal reflux disease)    Hair loss 07/20/2015   Heel pain, bilateral 01/15/2018   Hematuria 01/25/2017   Herpes zoster without complication 82/80/0349   History of excessive cerumen 05/08/2017   History of shingles 07/19/2015   Hives of unknown origin 10/12/2019   Hx of diverticulitis of colon 01/25/2017   Hyperlipidemia    Hyperpigmentation of skin 07/19/2015   Hypopigmentation 01/11/2016   IBS (irritable bowel syndrome)    Impacted cerumen, bilateral 02/09/2020   Iron deficiency anemia secondary to inadequate dietary iron intake 08/23/2017   Left arm numbness 10/13/2015   Left ear impacted cerumen 12/23/2020   Leg cramping 12/23/2020   Low iron stores 07/19/2015   Lung mass    Mid back pain on left side 07/20/2015   Migraine with aura and with status migrainosus, not intractable    Moderate protein-calorie malnutrition ([place]) 10/07/2019   Multiple food allergies 01/11/2016   Muscle strain of upper back 01/15/2018   Nausea 01/29/2012   Neck pain 10/13/2015   Neuropathy 02/02/2020   Normocytic anemia 06/18/2015   Numbness and tingling of right side of face 12/31/2018   OAB (overactive bladder) 09/21/2019   ONYCHOMYCOSIS, TOENAILS 06/09/2010   Qualifier: Diagnosis of  By: Inda Castle FNP, Melissa S    Osteopenia 01/20/2014   Osteoporosis 12/14/2020   -2.8. discussed starting medication.    Pelvic floor tension 02/27/2022    Formatting of this note might be different from the original. 02/27/2022 very tender on exam, particularly on the right  Last Assessment & Plan:  Formatting of this note might be different from the original. We discussed the pathophysiology of levator spasm, which can be triggered by any painful or traumatic pelvic episode, such as a severe UTI, pelvic surgery, sexual abuse, or chronic conditions.    Pelvic floor weakness 09/16/2017   Pernicious anemia 10/13/2015   Pneumaturia 02/27/2022   Pneumonia    Post herpetic neuralgia 06/21/2021   Pre-diabetes    Rectocele 02/27/2022   Formatting of this note might be different from the original. 02/27/2022  Describes a bulge or pocket in her vagina, has to splint for BM. Worsened by bearing down.    Last Assessment & Plan:  Formatting of this note might be different from the original. No evidence of prolapse/rectocele on exam today. CTM, meanwhile advised pt to avoid splinting, optimize diet, and f/u with pelvic floor PT   Recurrent infections 09/25/2019   Recurrent urinary tract infection 01/29/2012   Right knee pain 11/05/2016   Right-sided headache    S/P laparoscopic-assisted sigmoidectomy 09/08/2021   Seasonal allergic rhinitis due to pollen 09/25/2019  Seasonal and perennial allergic rhinitis 11/06/2011   Allergy skin test 02/13/2012: Mild to moderate reactions mainly for grass, weed, tree pollens, house dust/mite Allergy profile 12/20/2011: Total IgE 33.5. No specific elevations including shrimp.   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 treatment plan  repeat scan in one year and then if stable at that point we'll repeat every 2-3 years for monitoring.   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   Thyroid disease    Unintentional weight loss 07/22/2020   Urinary frequency  06/13/2018   Vaginal dryness 01/11/2016   Vaginal irritation 09/15/2018   Viral respiratory infection 12/04/2019   Vision changes 01/25/2017   Vitamin D deficiency 08/23/2017    Past Surgical History:  Procedure Laterality Date   ABDOMINAL HYSTERECTOMY     COLONOSCOPY     CYSTOSCOPY WITH STENT PLACEMENT Bilateral 09/08/2021   Procedure: CYSTOSCOPY WITH STENT PLACEMENT;  Surgeon: Irine Seal, MD;  Location: [facility];  Service: Urology;  Laterality: Bilateral;   FLEXIBLE SIGMOIDOSCOPY N/A 09/08/2021   Procedure: FLEXIBLE SIGMOIDOSCOPY;  Surgeon: Ileana Roup, MD;  Location: [facility];  Service: General;  Laterality: N/A;   HEMORRHOID SURGERY     LYSIS OF ADHESION N/A 09/08/2021   Procedure: LYSIS OF ADHESION;  Surgeon: Ileana Roup, MD;  Location: [facility];  Service: General;  Laterality: N/A;   TUBAL LIGATION     TUMOR REMOVAL     Left thigh     Family History  Problem Relation Age of Onset   Diabetes Mother    Liver cancer Mother        renal cell    Heart disease Father        first MI in 24s   Cervical cancer Sister    Allergic rhinitis Sister    Asthma Sister    Heart disease Brother        first MI in 12s   Alcohol abuse Sister    Fibromyalgia Sister    Pancreatic cancer Sister 36   Arthritis Sister        osteoarthritis   Emphysema Sister    Irritable bowel syndrome Sister    Emphysema Sister    Leukemia Other        grandson   Urticaria Daughter    Allergic rhinitis Daughter    Stomach cancer Neg Hx    Colon cancer Neg Hx    Angioedema Neg Hx    Eczema Neg Hx    Immunodeficiency Neg Hx    Colon polyps Neg Hx    Esophageal cancer Neg Hx    Rectal cancer Neg Hx       Social History   Socioeconomic History   Marital status: Single    Spouse name: Not on file   Number of children: 3   Years of education: 14   Highest education level: Associate degree: academic program  Occupational History   Occupation: Ship broker   Occupation: retired.  Tobacco Use    Smoking status: Former    Packs/day: 0.80    Years: 20.00    Total pack years: 16.00    Types: Cigarettes    Quit date: 08/13/1985    Years since quitting: 36.8   Smokeless tobacco: Never  Vaping Use   Vaping Use: Never used  Substance and Sexual Activity  Alcohol use: Yes    Comment: rare   Drug use: No   Sexual activity: Not Currently    Partners: Male    Birth control/protection: None  Other Topics Concern   Not on file  Social History Narrative   Her daughter is staying with her since her surgery. She enjoys shopping, exercise (when she can) and enjoys word finder.    Social Determinants of Health   Financial Resource Strain: Low Risk  (10/13/2021)   Overall Financial Resource Strain (CARDIA)    Difficulty of Paying Living Expenses: Not hard at all  Food Insecurity: No Food Insecurity (10/13/2021)   Hunger Vital Sign    Worried About Running Out of Food in the Last Year: Never true    Ran Out of Food in the Last Year: Never true  Transportation Needs: No Transportation Needs (10/13/2021)   PRAPARE - Hydrologist (Medical): No    Lack of Transportation (Non-Medical): No  Physical Activity: Inactive (10/13/2021)   Exercise Vital Sign    Days of Exercise per Week: 0 days    Minutes of Exercise per Session: 0 min  Stress: No Stress Concern Present (10/13/2021)   [place]    Feeling of Stress : Not at all  Social Connections: Moderately Isolated (10/13/2021)   Social Connection and Isolation Panel [NHANES]    Frequency of Communication with Friends and Family: More than three times a week    Frequency of Social Gatherings with Friends and Family: Once a week    Attends Religious Services: More than 4 times per year    Active Member of [employer] or Organizations: No    Attends Archivist Meetings: Never    Marital Status: Divorced    Allergies  Allergen Reactions   Bactrim  [Sulfamethoxazole-Trimethoprim] Anaphylaxis   Dairycare [Lactase-Lactobacillus] Anaphylaxis   Iodine Rash   Ciprofloxacin     Facial numbness   Iodinated Contrast Media Other (See Comments)    Face and chest felt "hot", throat felt tight.    Macrobid [Nitrofurantoin]     Increased BP/kidney function decline   Peanut Butter Flavor Nausea And Vomiting    N/V   Shellfish Allergy Swelling     Current Outpatient Medications:    albuterol (PROVENTIL HFA) 108 (90 Base) MCG/ACT inhaler, Inhale 2 puffs into the lungs every 6 (six) hours as needed for wheezing or shortness of breath., Disp: 18 g, Rfl: 0   ALPRAZolam (XANAX) 0.5 MG tablet, Take 1 tablet (0.5 mg total) by mouth 2 (two) times daily as needed for sleep., Disp: 60 tablet, Rfl: 5   atorvastatin (LIPITOR) 20 MG tablet, Take 20 mg by mouth daily., Disp: , Rfl:    b complex vitamins capsule, Take 1 capsule by mouth daily., Disp: , Rfl:    Blood Glucose Monitoring Suppl (ACCU-CHEK GUIDE ME) w/Device KIT, Dx R73.09 Elevated glucose - Check blood sugar 4 times daily., Disp: 1 kit, Rfl: prn   cholecalciferol (VITAMIN D3) 25 MCG (1000 UNIT) tablet, Take 1,000 Units by mouth daily., Disp: , Rfl:    clobetasol cream (TEMOVATE) 7.06 %, Apply 1 Application topically 2 (two) times daily., Disp: , Rfl:    conjugated estrogens (PREMARIN) vaginal cream, APPLY VAGINALLY 3 TIMES A WEEK AT NIGHT, Disp: 60 g, Rfl: 1   diclofenac (VOLTAREN) 75 MG EC tablet, TAKE 1 TABLET(75 MG) BY MOUTH TWICE DAILY, Disp: 60 tablet, Rfl: 1   EPINEPHrine 0.3  mg/0.3 mL IJ SOAJ injection, Inject 0.3 mg into the muscle as needed for anaphylaxis., Disp: 1 each, Rfl: 0   estradiol (ESTRACE) 0.1 MG/GM vaginal cream, Place 1 Applicatorful vaginally daily., Disp: , Rfl:    gabapentin (NEURONTIN) 100 MG capsule, Take 1 capsule (100 mg total) by mouth 3 (three) times daily., Disp: 90 capsule, Rfl: 1   ibuprofen (ADVIL) 800 MG tablet, Take 1 tablet (800 mg total) by mouth every 8  (eight) hours as needed., Disp: 90 tablet, Rfl: 1   L-Methylfolate-B6-B12 (FOLTANX) 3-35-2 MG TABS, Take 1 tablet by mouth in the morning and at bedtime., Disp: 60 tablet, Rfl: 11   Lancets 30G MISC, Dx R73.09 Elevated glucose - Check blood sugar 4 times daily., Disp: 300 each, Rfl: prn   levothyroxine (SYNTHROID) 75 MCG tablet, TAKE 1 TABLET(75 MCG) BY MOUTH DAILY BEFORE BREAKFAST, Disp: 90 tablet, Rfl: 1   lidocaine (LIDODERM) 5 %, Place 1 patch onto the skin daily. Remove & Discard patch within 12 hours or as directed by MD, Disp: 30 patch, Rfl: 0   lidocaine (XYLOCAINE) 5 % ointment, Apply 1 application topically daily., Disp: 50 g, Rfl: 0   linaclotide (LINZESS) 72 MCG capsule, Take 1 capsule (72 mcg total) by mouth daily before breakfast., Disp: 90 capsule, Rfl: 0   meclizine (ANTIVERT) 25 MG tablet, Take 1 tablet (25 mg total) by mouth 3 (three) times daily as needed for dizziness., Disp: 30 tablet, Rfl: 2   OVER THE COUNTER MEDICATION, Take 2 capsules by mouth daily. Friendly four, Disp: , Rfl:    pantoprazole (PROTONIX) 40 MG tablet, Take 40 mg by mouth in the morning and at bedtime. As needed, Disp: , Rfl:    PEPCID 20 MG tablet, Take 1 tablet (20 mg total) by mouth 2 (two) times daily. BRAND ONLY. (Patient taking differently: Take 20 mg by mouth 2 (two) times daily as needed for heartburn or indigestion. BRAND ONLY.), Disp: 180 tablet, Rfl: 4   promethazine (PHENERGAN) 25 MG tablet, Take 1 tablet (25 mg total) by mouth every 6 (six) hours as needed for nausea or vomiting., Disp: 30 tablet, Rfl: 0   Review of Systems  Constitutional:  Negative for activity change, appetite change, chills, diaphoresis, fatigue, fever and unexpected weight change.  HENT:  Negative for congestion, rhinorrhea, sinus pressure, sneezing, sore throat and trouble swallowing.   Eyes:  Negative for photophobia and visual disturbance.  Respiratory:  Negative for cough, chest tightness, shortness of breath, wheezing  and stridor.   Cardiovascular:  Negative for chest pain, palpitations and leg swelling.  Gastrointestinal:  Positive for constipation. Negative for abdominal distention, abdominal pain, anal bleeding, blood in stool, diarrhea, nausea and vomiting.  Genitourinary:  Positive for frequency. Negative for difficulty urinating, dysuria, flank pain and hematuria.  Musculoskeletal:  Negative for arthralgias, back pain, gait problem, joint swelling and myalgias.  Skin:  Negative for color change, pallor, rash and wound.  Neurological:  Negative for dizziness, tremors, weakness and light-headedness.  Hematological:  Negative for adenopathy. Does not bruise/bleed easily.  Psychiatric/Behavioral:  Negative for agitation, behavioral problems, confusion, decreased concentration, dysphoric mood and sleep disturbance.        Objective:   Physical Exam Constitutional:      General: She is not in acute distress.    Appearance: Normal appearance. She is well-developed. She is not ill-appearing or diaphoretic.  HENT:     Head: Normocephalic and atraumatic.     Right Ear: Hearing and external ear normal.  Left Ear: Hearing and external ear normal.     Nose: No nasal deformity or rhinorrhea.  Eyes:     General: No scleral icterus.    Conjunctiva/sclera: Conjunctivae normal.     Right eye: Right conjunctiva is not injected.     Left eye: Left conjunctiva is not injected.     Pupils: Pupils are equal, round, and reactive to light.  Neck:     Vascular: No JVD.  Cardiovascular:     Rate and Rhythm: Normal rate and regular rhythm.     Heart sounds: S1 normal and S2 normal.  Pulmonary:     Effort: Pulmonary effort is normal. No respiratory distress.     Breath sounds: No wheezing.  Abdominal:     General: Bowel sounds are normal. There is no distension.     Palpations: Abdomen is soft.     Tenderness: There is no abdominal tenderness.  Musculoskeletal:        General: Normal range of motion.      Right shoulder: Normal.     Left shoulder: Normal.     Cervical back: Normal range of motion and neck supple.     Right hip: Normal.     Left hip: Normal.     Right knee: Normal.     Left knee: Normal.  Lymphadenopathy:     Head:     Right side of head: No submandibular, preauricular or posterior auricular adenopathy.     Left side of head: No submandibular, preauricular or posterior auricular adenopathy.     Cervical: No cervical adenopathy.     Right cervical: No superficial or deep cervical adenopathy.    Left cervical: No superficial or deep cervical adenopathy.  Skin:    General: Skin is warm and dry.     Coloration: Skin is not pale.     Findings: No abrasion, bruising, ecchymosis, erythema, lesion or rash.     Nails: There is no clubbing.  Neurological:     Mental Status: She is alert and oriented to person, place, and time.     Sensory: No sensory deficit.     Coordination: Coordination normal.     Gait: Gait normal.  Psychiatric:        Attention and Perception: She is attentive.        Mood and Affect: Mood normal.        Speech: Speech normal.        Behavior: Behavior normal. Behavior is cooperative.        Thought Content: Thought content normal.        Judgment: Judgment normal.           Assessment & Plan:   Colonization of urine with MDR organisms:  The best strategy we can take at this point is RESTRAINT and not prescribing antibiotics to her  Transplantation of sensitive bacteria into urine of patients is something being contemplated but not a mainstream endeavor  Again the more we can STOP putting selective pressure one her urinary flora with recurrent antibiotics the better our chances of her urine becoming colonized with less resistant organisms.  Palpitations and chest discomfort: Defer to cardiology I do not see what this would have to do with her asymptomatic colonization with multidrug-resistant organisms  I plan on seeing her again in  roughly a month's time.  If she develops sufficient symptoms to merit treatment we will need to have an IV placed to give IV antibiotics  I spent 82 minutes  with the patient including than 50% of the time in face to face counseling of the patient and her asymptomatic bacteriuria personally reviewing CT scans along with review of medical records in preparation for the visit and during the visit and in coordination of her care.

## 2022-06-12 ENCOUNTER — Telehealth: Payer: Self-pay

## 2022-06-12 ENCOUNTER — Encounter: Payer: Self-pay | Admitting: Physician Assistant

## 2022-06-12 ENCOUNTER — Other Ambulatory Visit: Payer: Self-pay

## 2022-06-12 ENCOUNTER — Ambulatory Visit (INDEPENDENT_AMBULATORY_CARE_PROVIDER_SITE_OTHER): Payer: Medicare Other | Admitting: Infectious Disease

## 2022-06-12 DIAGNOSIS — A499 Bacterial infection, unspecified: Secondary | ICD-10-CM

## 2022-06-12 DIAGNOSIS — Z1630 Resistance to unspecified antimicrobial drugs: Secondary | ICD-10-CM | POA: Diagnosis not present

## 2022-06-12 DIAGNOSIS — R8271 Bacteriuria: Secondary | ICD-10-CM | POA: Diagnosis not present

## 2022-06-12 NOTE — Telephone Encounter (Signed)
Called patient to relay result per Dr. Tommy Medal. Patient still has concerns regarding results. Noted patient was already scheduled with provider today.  Jamie Mcalpine, LPN

## 2022-06-12 NOTE — Progress Notes (Signed)
Virtual Visit via Telephone Note  I connected with Jamie [name] on 06/12/22 at 11:15 AM EDT by telephone and verified that I am speaking with the correct person using two identifiers.  Location: Patient: Home Provider: [facility]   I discussed the limitations, risks, security and privacy concerns of performing an evaluation and management service by telephone and the availability of in person appointments. I also discussed with the patient that there may be a patient responsible charge related to this service. The patient expressed understanding and agreed to proceed.   History of Present Illness: Jamie [name] is a 71 year old Caucasian woman that I saw yesterday for consult regarding her persistent multidrug-resistant gram-negative bacteria in the urine including ESBL.  As I mentioned in my note yesterday I did not find much in terms of her symptomatology with the compel me to want to treat her.  I think again restraint and trying not to use antibiotics and much might allow less resistant organisms to take up residence in her bladder.  I noted on the UA that we checked that she had no white blood cells which is again reassuring because the diagnosis of a urinary tract infection requires symptoms which in the first place I am not convinced are sufficient to warrant this diagnosis plus pyuria then confirmed by culture.  She wanted more clarification about this and so therefore phone visit was arranged she also was concerned about the 0-5 hyaline cast that was seen a few times.  I am not terribly concerned about that but that could be brought up with her PCP or urologist.     Observations/Objective:  Jamie [name] appear to be doing well and in no acute distress.  Assessment and Plan:  Cessation with multidrug-resistant organisms include ESBL in the urine:  See notes from yesterday.  We will see her in roughly a month's time.  Follow Up Instructions:    I discussed the assessment and treatment plan with  the patient. The patient was provided an opportunity to ask questions and all were answered. The patient agreed with the plan and demonstrated an understanding of the instructions.   The patient was advised to call back or seek an in-person evaluation if the symptoms worsen or if the condition fails to improve as anticipated.  I provided 6 minutes of non-face-to-face time during this encounter.   Alcide Evener, MD

## 2022-06-12 NOTE — Telephone Encounter (Signed)
-----   Message from Truman Hayward, MD sent at 06/12/2022  9:51 AM EDT ----- Patients urine is without pyuria so even if her culture is + there is no way she would need treatment her bacteriia are NOT causing inflammation in the bladder since there aer no white blood cells ----- Message ----- From: [facility] Results In Sent: 06/12/2022   2:58 AM EDT To: Truman Hayward, MD

## 2022-06-13 ENCOUNTER — Encounter: Payer: Self-pay | Admitting: Infectious Disease

## 2022-06-13 ENCOUNTER — Other Ambulatory Visit: Payer: Self-pay | Admitting: Physician Assistant

## 2022-06-13 ENCOUNTER — Encounter: Payer: Self-pay | Admitting: Physician Assistant

## 2022-06-13 DIAGNOSIS — F419 Anxiety disorder, unspecified: Secondary | ICD-10-CM

## 2022-06-13 MED ORDER — ALPRAZOLAM 0.5 MG PO TABS: 0.5000 mg | ORAL_TABLET | Freq: Two times a day (BID) | ORAL | 1 refills | Status: DC | PRN

## 2022-06-13 NOTE — Telephone Encounter (Signed)
Please Advise

## 2022-06-13 NOTE — Telephone Encounter (Signed)
Xanax last filled 9/22  .Marland KitchenPDMP reviewed during this encounter.  Sent refills.

## 2022-06-14 ENCOUNTER — Ambulatory Visit (INDEPENDENT_AMBULATORY_CARE_PROVIDER_SITE_OTHER): Payer: Medicare Other | Admitting: Pulmonary Disease

## 2022-06-14 ENCOUNTER — Encounter: Payer: Self-pay | Admitting: Pulmonary Disease

## 2022-06-14 VITALS — BP 110/70 | HR 88 | Ht 64.0 in | Wt 141.4 lb

## 2022-06-14 DIAGNOSIS — R0609 Other forms of dyspnea: Secondary | ICD-10-CM

## 2022-06-14 LAB — URINALYSIS, ROUTINE W REFLEX MICROSCOPIC
Bacteria, UA: NONE SEEN /HPF
Bilirubin Urine: NEGATIVE
Glucose, UA: NEGATIVE
Hgb urine dipstick: NEGATIVE
Ketones, ur: NEGATIVE
Nitrite: NEGATIVE
Protein, ur: NEGATIVE
RBC / HPF: NONE SEEN /HPF (ref 0–2)
Specific Gravity, Urine: 1.003 (ref 1.001–1.035)
Squamous Epithelial / HPF: NONE SEEN /HPF (ref <=5)
WBC, UA: NONE SEEN /HPF (ref 0–5)
pH: 6 (ref 5.0–8.0)

## 2022-06-14 LAB — URINE CULTURE
MICRO NUMBER:: 14118616
SPECIMEN QUALITY:: ADEQUATE

## 2022-06-14 LAB — MICROSCOPIC MESSAGE

## 2022-06-14 NOTE — Progress Notes (Signed)
Jamie [name]    [id number]    Nov 02, 1950  Primary Care Physician:Breeback, Royetta Car, PA-C  Referring Physician: [name], PA-C [address] [place],  [place] [address]  Chief complaint:   Referred for spot on her lung, was told she has COPD  HPI:  Patient was told during recent hospital visit that she has a spot on her lung from a recent chest x-ray and concern for COPD  Has had symptoms of chest tightness, shortness of breath since about January 2023 had colon surgery.  She did develop a bladder infection with E. coli that she has been struggling with since then She states her heart rate has been high since then, shortness of breath  Quit smoking over 30 years ago, was a social smoker Worked in [employer], also did some cleaning  She says her oxygen runs low sometimes with a low of 88  She was very active with no limitations prior to the surgery and urinary tract infection which she has been dealing with since January    Outpatient Encounter Medications as of 06/14/2022  Medication Sig   albuterol (PROVENTIL HFA) 108 (90 Base) MCG/ACT inhaler Inhale 2 puffs into the lungs every 6 (six) hours as needed for wheezing or shortness of breath.   ALPRAZolam (XANAX) 0.5 MG tablet Take 1 tablet (0.5 mg total) by mouth 2 (two) times daily as needed for sleep.   ASPIRIN LOW DOSE 81 MG tablet Take 81 mg by mouth daily.   atorvastatin (LIPITOR) 20 MG tablet Take 20 mg by mouth daily.   b complex vitamins capsule Take 1 capsule by mouth daily.   Blood Glucose Monitoring Suppl (ACCU-CHEK GUIDE ME) w/Device KIT Dx R73.09 Elevated glucose - Check blood sugar 4 times daily.   cholecalciferol (VITAMIN D3) 25 MCG (1000 UNIT) tablet Take 1,000 Units by mouth daily.   clobetasol cream (TEMOVATE) 8.91 % Apply 1 Application topically 2 (two) times daily.   conjugated estrogens (PREMARIN) vaginal cream APPLY VAGINALLY 3 TIMES A WEEK AT NIGHT   diclofenac  (VOLTAREN) 75 MG EC tablet TAKE 1 TABLET(75 MG) BY MOUTH TWICE DAILY   EPINEPHrine 0.3 mg/0.3 mL IJ SOAJ injection Inject 0.3 mg into the muscle as needed for anaphylaxis.   estradiol (ESTRACE) 0.1 MG/GM vaginal cream Place 1 Applicatorful vaginally daily.   fluconazole (DIFLUCAN) 150 MG tablet Take 1 tablet (150 mg total) by mouth daily.   gabapentin (NEURONTIN) 100 MG capsule Take 1 capsule (100 mg total) by mouth 3 (three) times daily.   ibuprofen (ADVIL) 800 MG tablet Take 1 tablet (800 mg total) by mouth every 8 (eight) hours as needed.   L-Methylfolate-B6-B12 (FOLTANX) 3-35-2 MG TABS Take 1 tablet by mouth in the morning and at bedtime.   Lancets 30G MISC Dx R73.09 Elevated glucose - Check blood sugar 4 times daily.   levothyroxine (SYNTHROID) 75 MCG tablet TAKE 1 TABLET(75 MCG) BY MOUTH DAILY BEFORE BREAKFAST   lidocaine (LIDODERM) 5 % Place 1 patch onto the skin daily. Remove & Discard patch within 12 hours or as directed by MD   lidocaine (XYLOCAINE) 5 % ointment Apply 1 application topically daily.   linaclotide (LINZESS) 72 MCG capsule Take 1 capsule (72 mcg total) by mouth daily before breakfast.   meclizine (ANTIVERT) 25 MG tablet Take 1 tablet (25 mg total) by mouth 3 (three) times daily as needed for dizziness.   OVER THE COUNTER MEDICATION  Take 2 capsules by mouth daily. Friendly four   pantoprazole (PROTONIX) 40 MG tablet Take 40 mg by mouth in the morning and at bedtime. As needed   PEPCID 20 MG tablet Take 1 tablet (20 mg total) by mouth 2 (two) times daily. BRAND ONLY. (Patient taking differently: Take 20 mg by mouth 2 (two) times daily as needed for heartburn or indigestion. BRAND ONLY.)   promethazine (PHENERGAN) 25 MG tablet Take 1 tablet (25 mg total) by mouth every 6 (six) hours as needed for nausea or vomiting.   [DISCONTINUED] ALPRAZolam (XANAX) 0.5 MG tablet Take 1 tablet (0.5 mg total) by mouth 2 (two) times daily as needed for sleep.   No facility-administered  encounter medications on file as of 06/14/2022.    Allergies as of 06/14/2022 - Review Complete 06/14/2022  Allergen Reaction Noted   Bactrim [sulfamethoxazole-trimethoprim] Anaphylaxis 10/14/2021   Dairycare [lactase-lactobacillus] Anaphylaxis 08/21/2021   Ciprofloxacin  01/31/2012   Iodine Rash 05/15/2017   Iodinated contrast media Other (See Comments) 11/13/2016   Macrobid [nitrofurantoin]  11/20/2021   Peanut butter flavor Nausea And Vomiting 02/13/2012   Shellfish allergy Swelling 10/01/2011    Past Medical History:  Diagnosis Date   Abdominal bloating 08/23/2017   Abnormal finding on urinalysis 02/27/2022   Last Assessment & Plan:  Formatting of this note might be different from the original. 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   Antibiotic-resistant bacterial infection 11/20/2021   Anxiety    Anxiety about health 08/23/2017   ANXIETY DEPRESSION 06/09/2010   Qualifier: Diagnosis of  By: Inda Castle FNP, Melissa S    Asymptomatic bacteriuria 06/11/2022   Atypical chest pain 08/23/2017   B12 deficiency 06/13/2015   Bacteriuria 11/29/2021   Biceps tendonitis on right 04/13/2022   Bilateral headaches    Bilateral tinnitus 12/23/2020   Cardiovascular risk factor 05/08/2017   Carotid stenosis, asymptomatic, bilateral 05/08/2017   Carotid dopplers showed bilateral ICA stenosis 1-39 percent 2016 Minor carotid intimal thickening without significant  atherosclerosis. No hemodynamically significant ICA stenosis. Degree of narrowing less than 50% bilaterally. 08/2015  1. Mild (1-49%) stenosis proximal right internal carotid artery secondary to heterogenous atherosclerotic plaque. 2. Mild (1-49%) stenosis proximal left internal car   Carpal tunnel syndrome 10/17/2015   Cataract    bilateral   Cervical radiculopathy 10/01/2018   Xray 09/2018: cervical DDD, disc height loss at C5 and C6.    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   Constipation 07/19/2015   DDD (degenerative disc disease), cervical 08/31/2015   Depression with anxiety 06/18/2015   Diverticulitis of large intestine with abscess without bleeding    Diverticulosis    Diverticulosis of large intestine 07/03/2011   Dyspnea    Elevated random blood glucose level 11/29/2021   Epigastric pain 07/06/2019   ETD (Eustachian tube dysfunction), right 06/11/2018   Exposure to chemical inhalation 11/06/2016   Exposure to  hepatitis C 06/21/2021   Exposure to TB 06/21/2021   Extrinsic asthma, unspecified    Family history of renal cancer 07/20/2015   Fatigue 01/15/2018   Former smoker 05/14/2016   GAD (generalized anxiety disorder) 10/12/2019   Gastritis and duodenitis 04/15/2017   Generalized abdominal pain 01/24/2014   Genitourinary syndrome of menopause 02/27/2022   Formatting of this note might be different from the original. On premarin cream  Last Assessment & Plan:  Formatting of this note might be different from the original. - continue premarin cream 2-3x per week   GERD (gastroesophageal reflux disease)    Hair loss 07/20/2015   Heel pain, bilateral 01/15/2018   Hematuria 01/25/2017   Herpes zoster without complication 16/05/9603   History of excessive cerumen 05/08/2017   History of shingles 07/19/2015   Hives of unknown origin 10/12/2019   Hx of diverticulitis of colon 01/25/2017   Hyperlipidemia    Hyperpigmentation of skin  07/19/2015   Hypopigmentation 01/11/2016   IBS (irritable bowel syndrome)    Impacted cerumen, bilateral 02/09/2020   Iron deficiency anemia secondary to inadequate dietary iron intake 08/23/2017   Left arm numbness 10/13/2015   Left ear impacted cerumen 12/23/2020   Leg cramping 12/23/2020   Low iron stores 07/19/2015   Lung mass    Mid back pain on left side 07/20/2015   Migraine with aura and with status migrainosus, not intractable    Moderate protein-calorie malnutrition ([place]) 10/07/2019   Multiple food allergies 01/11/2016   Muscle strain of upper back 01/15/2018   Nausea 01/29/2012   Neck pain 10/13/2015   Neuropathy 02/02/2020   Normocytic anemia 06/18/2015   Numbness and tingling of right side of face 12/31/2018   OAB (overactive bladder) 09/21/2019   ONYCHOMYCOSIS, TOENAILS 06/09/2010   Qualifier: Diagnosis of  By: Inda Castle FNP, Melissa S    Osteopenia 01/20/2014   Osteoporosis 12/14/2020   -2.8. discussed starting medication.    Pelvic floor tension 02/27/2022   Formatting of this note might be different from the original. 02/27/2022 very tender on exam, particularly on the right  Last Assessment & Plan:  Formatting of this note might be different from the original. We discussed the pathophysiology of levator spasm, which can be triggered by any painful or traumatic pelvic episode, such as a severe UTI, pelvic surgery, sexual abuse, or chronic conditions.    Pelvic floor weakness 09/16/2017   Pernicious anemia 10/13/2015   Pneumaturia 02/27/2022   Pneumonia    Post herpetic neuralgia 06/21/2021   Pre-diabetes    Rectocele 02/27/2022   Formatting of this note might be different from the original. 02/27/2022  Describes a bulge or pocket in her vagina, has to splint for BM. Worsened by bearing down.    Last Assessment & Plan:  Formatting of this note might be different from the original. No evidence of prolapse/rectocele on exam today. CTM, meanwhile advised pt to avoid splinting, optimize diet, and f/u  with pelvic floor PT   Recurrent infections 09/25/2019   Recurrent urinary tract infection 01/29/2012   Right knee pain 11/05/2016   Right-sided headache    S/P laparoscopic-assisted sigmoidectomy 09/08/2021   Seasonal allergic rhinitis due to pollen 09/25/2019   Seasonal and perennial allergic rhinitis 11/06/2011   Allergy skin test 02/13/2012: Mild to moderate reactions mainly for grass, weed, tree pollens, house dust/mite Allergy profile 12/20/2011: Total IgE 33.5. No specific elevations including shrimp.   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 treatment plan  repeat scan in one year and then if stable at that point we'll repeat every 2-3 years for monitoring.   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   Thyroid disease    Unintentional weight loss 07/22/2020   Urinary frequency 06/13/2018   Vaginal dryness 01/11/2016   Vaginal irritation 09/15/2018   Viral respiratory infection 12/04/2019   Vision changes 01/25/2017   Vitamin D deficiency 08/23/2017    Past Surgical History:  Procedure Laterality Date   ABDOMINAL HYSTERECTOMY     COLONOSCOPY     CYSTOSCOPY WITH STENT PLACEMENT Bilateral 09/08/2021   Procedure: CYSTOSCOPY WITH STENT PLACEMENT;  Surgeon: Irine Seal, MD;  Location: [facility];  Service: Urology;  Laterality: Bilateral;   FLEXIBLE SIGMOIDOSCOPY N/A 09/08/2021   Procedure: FLEXIBLE SIGMOIDOSCOPY;  Surgeon: Ileana Roup, MD;  Location: [facility];  Service: General;  Laterality: N/A;   HEMORRHOID SURGERY     LYSIS OF ADHESION N/A 09/08/2021   Procedure: LYSIS OF ADHESION;  Surgeon: Ileana Roup, MD;  Location: [facility];  Service: General;  Laterality: N/A;   TUBAL LIGATION     TUMOR REMOVAL     Left thigh     Family History  Problem Relation Age of Onset   Diabetes Mother    Liver  cancer Mother        renal cell    Heart disease Father        first MI in 62s   Cervical cancer Sister    Allergic rhinitis Sister    Asthma Sister    Heart disease Brother        first MI in 25s   Alcohol abuse Sister    Fibromyalgia Sister    Pancreatic cancer Sister 78   Arthritis Sister        osteoarthritis   Emphysema Sister    Irritable bowel syndrome Sister    Emphysema Sister    Leukemia Other        grandson   Urticaria Daughter    Allergic rhinitis Daughter    Stomach cancer Neg Hx    Colon cancer Neg Hx    Angioedema Neg Hx    Eczema Neg Hx    Immunodeficiency Neg Hx    Colon polyps Neg Hx    Esophageal cancer Neg Hx    Rectal cancer Neg Hx     Social History   Socioeconomic History   Marital status: Single    Spouse name: Not on file   Number of children: 3   Years of education: 14   Highest education level: Associate degree: academic program  Occupational History   Occupation: Ship broker   Occupation: retired.  Tobacco Use   Smoking status: Former    Packs/day: 0.80    Years: 20.00    Total pack years: 16.00    Types: Cigarettes    Quit date: 08/13/1985    Years since quitting: 36.8   Smokeless tobacco: Never  Vaping Use   Vaping Use: Never used  Substance and Sexual Activity   Alcohol use: Yes    Comment: rare   Drug use: No   Sexual activity: Not Currently    Partners: Male    Birth control/protection: None  Other Topics Concern   Not on file  Social History Narrative   Her daughter is staying with her since  her surgery. She enjoys shopping, exercise (when she can) and enjoys word finder.    Social Determinants of Health   Financial Resource Strain: Low Risk  (10/13/2021)   Overall Financial Resource Strain (CARDIA)    Difficulty of Paying Living Expenses: Not hard at all  Food Insecurity: No Food Insecurity (10/13/2021)   Hunger Vital Sign    Worried About Running Out of Food in the Last Year: Never true    Ran Out of Food in the Last  Year: Never true  Transportation Needs: No Transportation Needs (10/13/2021)   PRAPARE - Hydrologist (Medical): No    Lack of Transportation (Non-Medical): No  Physical Activity: Inactive (10/13/2021)   Exercise Vital Sign    Days of Exercise per Week: 0 days    Minutes of Exercise per Session: 0 min  Stress: No Stress Concern Present (10/13/2021)   [place]    Feeling of Stress : Not at all  Social Connections: Moderately Isolated (10/13/2021)   Social Connection and Isolation Panel [NHANES]    Frequency of Communication with Friends and Family: More than three times a week    Frequency of Social Gatherings with Friends and Family: Once a week    Attends Religious Services: More than 4 times per year    Active Member of [employer] or Organizations: No    Attends Archivist Meetings: Never    Marital Status: Divorced  Human resources officer Violence: Not At Risk (10/13/2021)   Humiliation, Afraid, Rape, and Kick questionnaire    Fear of Current or Ex-Partner: No    Emotionally Abused: No    Physically Abused: No    Sexually Abused: No    Review of Systems  Respiratory:  Positive for shortness of breath.     Vitals:   06/14/22 1046  BP: 110/70  Pulse: 88  SpO2: 100%     Physical Exam Constitutional:      Appearance: Normal appearance.  HENT:     Head: Normocephalic.     Mouth/Throat:     Mouth: Mucous membranes are moist.  Eyes:     Pupils: Pupils are equal, round, and reactive to light.  Cardiovascular:     Rate and Rhythm: Normal rate and regular rhythm.     Heart sounds: No murmur heard. Pulmonary:     Effort: No respiratory distress.     Breath sounds: No stridor. No wheezing or rhonchi.  Musculoskeletal:     Cervical back: No rigidity or tenderness.  Neurological:     Mental Status: She is alert.  Psychiatric:        Mood and Affect: Mood normal.    Data  Reviewed: PFT from 2019 did show mild obstructive disease  CT scan of the chest from 05/29/2021 reviewed showing centrilobular emphysema  Assessment:  Abnormal CT scan of the chest showing centrilobular emphysema  Chest x-ray with increased AP diameter and flattened diaphragm concerning for obstructive lung disease  Previous pulmonary function test showing mild obstructive disease  Patient with shortness of breath  Urinary tract infection   Plan/Recommendations: Schedule for pulmonary function test  Graded exercise as tolerated  No clear indication for any inhalers at present  Follow-up in about 6 weeks, to have PFT on day of visit  Sherrilyn Rist MD [facility] 06/14/2022, 11:23 AM  CC: [name], PA-C

## 2022-06-14 NOTE — Patient Instructions (Addendum)
We will schedule you for a breathing study to be done on the day of your next visit  I will see you about 6 weeks from here  Graded exercises as tolerated  Call us with any significant concerns

## 2022-06-15 ENCOUNTER — Encounter: Payer: Self-pay | Admitting: Physician Assistant

## 2022-06-16 ENCOUNTER — Encounter: Payer: Self-pay | Admitting: Physician Assistant

## 2022-06-17 ENCOUNTER — Encounter: Payer: Self-pay | Admitting: Infectious Disease

## 2022-06-18 ENCOUNTER — Encounter: Payer: Self-pay | Admitting: Physician Assistant

## 2022-06-20 ENCOUNTER — Ambulatory Visit (INDEPENDENT_AMBULATORY_CARE_PROVIDER_SITE_OTHER): Payer: Medicare Other | Admitting: Physician Assistant

## 2022-06-20 VITALS — BP 120/75 | HR 78 | Temp 97.7°F | Ht 64.0 in | Wt 139.0 lb

## 2022-06-20 DIAGNOSIS — R0602 Shortness of breath: Secondary | ICD-10-CM

## 2022-06-20 DIAGNOSIS — R8271 Bacteriuria: Secondary | ICD-10-CM

## 2022-06-20 DIAGNOSIS — H539 Unspecified visual disturbance: Secondary | ICD-10-CM

## 2022-06-20 DIAGNOSIS — R413 Other amnesia: Secondary | ICD-10-CM | POA: Diagnosis not present

## 2022-06-20 DIAGNOSIS — G4452 New daily persistent headache (NDPH): Secondary | ICD-10-CM

## 2022-06-20 NOTE — Progress Notes (Signed)
Established Patient Office Visit  Subjective   Patient ID: Jamie [name], female    DOB: 02/11/1951  Age: 71 y.o. MRN: [id number]  Chief Complaint  Patient presents with   Follow-up    HPI Pt is a 71 yo female with bacteruria who is concerned about her health.   She has bacteruria with no flank pain, fever, nausea. She does have intermittent dysuria and feels like there is "air bubbles in her vagina". She just finished macrobid that [facility] gave her. Infectious disease has told her not to treat bacteria in urine but she feels like she has too.   She has SOB but her oxygen stays good. She does have some emphysema. She is not on inhaler. She saw pulmonology and they will do breathing test at next visit.   She sees cardiology as well.  Her BP checks out good. Cardiology has ordered treadmill sestamibi. She was started on toprol. Recent echo with normal EF. Will consider event monitor.   She is having daily headaches and feels like a rope around her abdomen.   Her memory is worsening and feels like she cannot remember things or problem solve. Her vision is blurry. She feels off balance.   Patient Active Problem List   Diagnosis Date Noted   SOB (shortness of breath) on exertion 06/20/2022   Asymptomatic bacteriuria 06/11/2022   Allergy 05/17/2022   Anxiety 05/17/2022   Cataract 05/17/2022   Diverticulosis 05/17/2022   Dyspnea 05/17/2022   GERD (gastroesophageal reflux disease) 05/17/2022   IBS (irritable bowel syndrome) 05/17/2022   Pneumonia 05/17/2022   Pre-diabetes 05/17/2022   Acute pain of right shoulder 04/13/2022   Biceps tendonitis on right 04/13/2022   Abnormal finding on urinalysis 02/27/2022   Genitourinary syndrome of menopause 02/27/2022   Pelvic floor tension 02/27/2022   Pneumaturia 02/27/2022   Rectocele 02/27/2022   Acquired hypothyroidism 02/07/2022   Bacteriuria 11/29/2021   Elevated random blood glucose level 11/29/2021   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   Antibiotic-resistant bacterial infection 11/20/2021   S/P laparoscopic-assisted sigmoidectomy 09/08/2021   Exposure to TB 06/21/2021   Exposure to hepatitis C 06/21/2021   Post herpetic neuralgia 06/21/2021   Herpes zoster without complication 82/50/0370   Bilateral tinnitus 12/23/2020   Leg cramping 12/23/2020   Left ear impacted cerumen 12/23/2020   Acute non-recurrent maxillary sinusitis 12/23/2020   Osteoporosis 12/14/2020   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   Unintentional weight loss 07/22/2020   Impacted cerumen, bilateral 02/09/2020   Neuropathy 02/02/2020   Viral respiratory infection 12/04/2019   GAD (generalized anxiety disorder) 10/12/2019   Hives of unknown origin 10/12/2019   Anaphylactic syndrome 10/12/2019   Moderate protein-calorie malnutrition ([place]) 10/07/2019   Allergic reaction 09/25/2019   Recurrent infections 09/25/2019   Seasonal allergic rhinitis due to pollen 09/25/2019   OAB (overactive bladder) 09/21/2019   Stress due to family tension 07/15/2019   Epigastric pain 07/06/2019   Tachycardia 01/02/2019   Numbness and tingling of right side of face 12/31/2018   Right-sided headache 12/31/2018   Cervical radiculopathy 10/01/2018   Vaginal irritation 09/15/2018   Anal itching 09/15/2018   Urinary frequency 06/13/2018   ETD (Eustachian tube dysfunction), right 06/11/2018   Fatigue 01/15/2018   Heel pain, bilateral 01/15/2018   Muscle strain of upper back 01/15/2018   Acute bilateral low back pain with bilateral sciatica 10/07/2017   Pelvic floor weakness 09/16/2017   Bilateral headaches 09/16/2017   Adverse food reaction 09/02/2017   Vitamin D deficiency 08/23/2017  Iron deficiency anemia secondary to inadequate dietary iron intake 08/23/2017   Abnormal urine odor 08/23/2017   Atypical chest pain 08/23/2017   Abdominal bloating 08/23/2017   Anxiety about health 08/23/2017   Carotid stenosis, asymptomatic, bilateral 05/08/2017   History of  excessive cerumen 05/08/2017   Cardiovascular risk factor 05/08/2017   Gastritis and duodenitis 04/15/2017   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   Vision changes 01/25/2017   Hematuria 01/25/2017   Hx of diverticulitis of colon 01/25/2017   Acute left lower quadrant pain 01/25/2017   Exposure to chemical inhalation 11/06/2016   Right knee pain 11/05/2016   Former smoker 05/14/2016   Hypopigmentation 01/11/2016   Anaphylactic reaction due to food 01/11/2016   Vaginal dryness 01/11/2016   Multiple food allergies 01/11/2016   Adhesive capsulitis of shoulder 10/17/2015   Carpal tunnel syndrome 10/17/2015   Left arm numbness 10/13/2015   Neck pain 10/13/2015   Pernicious anemia 10/13/2015   Chronically dry eyes 10/13/2015   DDD (degenerative disc disease), cervical 08/31/2015   Mid back pain on left side 07/20/2015   Hair loss 07/20/2015   Family history of renal cancer 07/20/2015   History of shingles 07/19/2015   Low iron stores 07/19/2015   Subclinical hypothyroidism 07/19/2015   Constipation 07/19/2015   Hyperpigmentation of skin 07/19/2015   Depression with anxiety 06/18/2015   Normocytic anemia 06/18/2015   Diverticulitis of large intestine with abscess without bleeding    B12 deficiency 06/13/2015   Migraine with aura and with status migrainosus, not intractable 04/20/2015   Lung mass 04/20/2015   Thyroid disease 03/08/2015   Generalized abdominal pain 01/24/2014   Osteopenia 01/20/2014   Change in consistency of stool 01/01/2014   Nausea 01/29/2012   Recurrent urinary tract infection 01/29/2012   Seasonal and perennial allergic rhinitis 11/06/2011   Diverticulosis of large intestine 07/03/2011   Hyperlipidemia 12/13/2010   ONYCHOMYCOSIS, TOENAILS 06/09/2010   ANXIETY DEPRESSION 06/09/2010   Past Medical History:  Diagnosis Date   Abdominal bloating 08/23/2017   Abnormal finding on urinalysis 02/27/2022   Last Assessment & Plan:   Formatting of this note might be different from the original. 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   Antibiotic-resistant bacterial infection 11/20/2021   Anxiety    Anxiety about health 08/23/2017   ANXIETY DEPRESSION 06/09/2010   Qualifier: Diagnosis of  By: Inda Castle FNP, Melissa S    Asymptomatic bacteriuria 06/11/2022   Atypical chest pain 08/23/2017   B12 deficiency 06/13/2015   Bacteriuria 11/29/2021   Biceps tendonitis on right 04/13/2022   Bilateral headaches    Bilateral tinnitus 12/23/2020   Cardiovascular risk factor 05/08/2017   Carotid stenosis, asymptomatic, bilateral 05/08/2017   Carotid dopplers showed bilateral ICA stenosis 1-39 percent 2016 Minor carotid intimal thickening without significant atherosclerosis. No hemodynamically significant ICA stenosis. Degree of narrowing less than 50% bilaterally. 08/2015  1. Mild (1-49%) stenosis proximal right internal carotid  artery secondary to heterogenous atherosclerotic plaque. 2. Mild (1-49%) stenosis proximal left internal car   Carpal tunnel syndrome 10/17/2015   Cataract    bilateral   Cervical radiculopathy 10/01/2018   Xray 09/2018: cervical DDD, disc height loss at C5 and C6.    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   Constipation 07/19/2015   DDD (degenerative disc disease), cervical 08/31/2015   Depression with anxiety 06/18/2015    Diverticulitis of large intestine with abscess without bleeding    Diverticulosis    Diverticulosis of large intestine 07/03/2011   Dyspnea    Elevated random blood glucose level 11/29/2021   Epigastric pain 07/06/2019   ETD (Eustachian tube dysfunction), right 06/11/2018   Exposure to chemical inhalation 11/06/2016   Exposure to hepatitis C 06/21/2021   Exposure to TB 06/21/2021   Extrinsic asthma, unspecified    Family history of renal cancer 07/20/2015   Fatigue 01/15/2018   Former smoker 05/14/2016   GAD (generalized anxiety disorder) 10/12/2019   Gastritis and duodenitis 04/15/2017   Generalized abdominal pain 01/24/2014   Genitourinary syndrome of menopause 02/27/2022   Formatting of this note might be different from the original. On premarin cream  Last Assessment & Plan:  Formatting of this note might be different from the original. - continue premarin cream 2-3x per week   GERD (gastroesophageal reflux disease)    Hair loss 07/20/2015   Heel pain, bilateral 01/15/2018   Hematuria 01/25/2017   Herpes zoster without complication 78/46/9629   History of excessive cerumen 05/08/2017   History of shingles 07/19/2015   Hives of unknown origin 10/12/2019   Hx of diverticulitis of colon 01/25/2017   Hyperlipidemia    Hyperpigmentation of skin 07/19/2015   Hypopigmentation 01/11/2016   IBS (irritable bowel syndrome)    Impacted cerumen, bilateral 02/09/2020   Iron deficiency anemia secondary to inadequate dietary iron intake 08/23/2017   Left arm numbness 10/13/2015   Left ear impacted cerumen 12/23/2020   Leg cramping 12/23/2020   Low iron stores 07/19/2015   Lung mass    Mid back pain on left side 07/20/2015   Migraine with aura and with status migrainosus, not intractable    Moderate protein-calorie malnutrition ([place]) 10/07/2019   Multiple food allergies 01/11/2016   Muscle strain of upper back 01/15/2018   Nausea 01/29/2012   Neck pain 10/13/2015   Neuropathy 02/02/2020   Normocytic anemia 06/18/2015    Numbness and tingling of right side of face 12/31/2018   OAB (overactive bladder) 09/21/2019   ONYCHOMYCOSIS, TOENAILS 06/09/2010   Qualifier: Diagnosis of  By: Inda Castle FNP, Melissa S    Osteopenia 01/20/2014   Osteoporosis 12/14/2020   -2.8. discussed starting medication.    Pelvic floor tension 02/27/2022   Formatting of this note might be different from the original. 02/27/2022 very tender on exam, particularly on the right  Last Assessment & Plan:  Formatting of this note might be different from the original. We discussed the pathophysiology of levator spasm, which can be triggered by any painful or traumatic pelvic episode, such as a severe UTI, pelvic surgery, sexual abuse, or chronic conditions.    Pelvic floor weakness 09/16/2017   Pernicious anemia 10/13/2015   Pneumaturia 02/27/2022   Pneumonia    Post herpetic neuralgia 06/21/2021   Pre-diabetes    Rectocele 02/27/2022   Formatting of this note might be different from the original. 02/27/2022  Describes a bulge or pocket in  her vagina, has to splint for BM. Worsened by bearing down.    Last Assessment & Plan:  Formatting of this note might be different from the original. No evidence of prolapse/rectocele on exam today. CTM, meanwhile advised pt to avoid splinting, optimize diet, and f/u with pelvic floor PT   Recurrent infections 09/25/2019   Recurrent urinary tract infection 01/29/2012   Right knee pain 11/05/2016   Right-sided headache    S/P laparoscopic-assisted sigmoidectomy 09/08/2021   Seasonal allergic rhinitis due to pollen 09/25/2019   Seasonal and perennial allergic rhinitis 11/06/2011   Allergy skin test 02/13/2012: Mild to moderate reactions mainly for grass, weed, tree pollens, house dust/mite Allergy profile 12/20/2011: Total IgE 33.5. No specific elevations including shrimp.   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 treatment plan  repeat  scan in one year and then if stable at that point we'll repeat every 2-3 years for monitoring.   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   Thyroid disease    Unintentional weight loss 07/22/2020   Urinary frequency 06/13/2018   Vaginal dryness 01/11/2016   Vaginal irritation 09/15/2018   Viral respiratory infection 12/04/2019   Vision changes 01/25/2017   Vitamin D deficiency 08/23/2017   Family History  Problem Relation Age of Onset   Diabetes Mother    Liver cancer Mother        renal cell    Heart disease Father        first MI in 53s   Cervical cancer Sister    Allergic rhinitis Sister    Asthma Sister    Heart disease Brother        first MI in 62s   Alcohol abuse Sister    Fibromyalgia Sister    Pancreatic cancer Sister 33   Arthritis Sister        osteoarthritis   Emphysema Sister    Irritable bowel syndrome Sister    Emphysema Sister    Leukemia Other        grandson   Urticaria Daughter    Allergic rhinitis Daughter    Stomach cancer Neg Hx    Colon cancer Neg Hx    Angioedema Neg Hx    Eczema Neg Hx    Immunodeficiency Neg Hx    Colon polyps Neg Hx    Esophageal cancer Neg Hx    Rectal cancer Neg Hx    Allergies  Allergen Reactions   Bactrim [Sulfamethoxazole-Trimethoprim] Anaphylaxis   Dairycare [Lactase-Lactobacillus] Anaphylaxis   Ciprofloxacin     Facial numbness   Iodine Rash   Iodinated Contrast Media Other (See Comments)    Face and chest felt "hot", throat felt tight.    Macrobid [Nitrofurantoin]     Increased BP/kidney function decline   Peanut Butter Flavor Nausea And Vomiting    N/V   Shellfish Allergy Swelling      ROS See HPI.    Objective:     BP 120/75   Pulse 78   Temp 97.7 F (36.5 C) (Oral)   Ht _0  (1.626 m)   Wt 139 lb (63 kg)   SpO2 100%   BMI 23.86 kg/m  BP Readings from Last 3 Encounters:  06/20/22 120/75  06/14/22  110/70  06/11/22 119/73   Wt Readings from Last 3 Encounters:  06/20/22 139  lb (63 kg)  06/14/22 141 lb 6.4 oz (64.1 kg)  06/11/22 140 lb (63.5 kg)    6 MIN walk test pulse ox did not go below 98 percent.  ..    06/20/2022    3:36 PM  [place] Cognitive Assessment   Visuospatial/ Executive (0/5) 2  Naming (0/3) 2  Attention: Read list of digits (0/2) 2  Attention: Read list of letters (0/1) 1  Attention: Serial 7 subtraction starting at 100 (0/3) 3  Language: Repeat phrase (0/2) 0  Language : Fluency (0/1) 1  Abstraction (0/2) 1  Delayed Recall (0/5) 4  Orientation (0/6) 6  Total 22  Adjusted Score (based on education) 22     Physical Exam Constitutional:      Appearance: Normal appearance.  HENT:     Head: Normocephalic.  Cardiovascular:     Rate and Rhythm: Normal rate and regular rhythm.  Pulmonary:     Effort: Pulmonary effort is normal.  Abdominal:     General: There is no distension.     Palpations: Abdomen is soft. There is no mass.     Tenderness: There is no abdominal tenderness. There is no right CVA tenderness, left CVA tenderness, guarding or rebound.  Neurological:     General: No focal deficit present.     Mental Status: She is alert and oriented to person, place, and time.  Psychiatric:     Comments: anxious          Assessment & Plan:  Marland KitchenMarland KitchenThayer was seen today for follow-up.  Diagnoses and all orders for this visit:  SOB (shortness of breath) on exertion  Bacteriuria -     Cytology - non gyn -     Cancel: Magnesium, urine -     ANA Screen,IFA,Reflex Titer/Pattern,Reflex Mplx 11 Ab Cascade with IdentRA -     Cancel: Magnesium -     CBC w/Diff/Platelet -     Sed Rate (ESR) -     Magnesium -     Magnesium, urine -     MISCELLANEOUS LAB ORDER 2  Memory changes -     Cytology - non gyn -     Cancel: Magnesium, urine -     ANA Screen,IFA,Reflex Titer/Pattern,Reflex Mplx 11 Ab Cascade with IdentRA -     Cancel: Magnesium -     CBC  w/Diff/Platelet -     Sed Rate (ESR)  Vision changes -     Cytology - non gyn -     Cancel: Magnesium, urine -     ANA Screen,IFA,Reflex Titer/Pattern,Reflex Mplx 11 Ab Cascade with IdentRA -     Cancel: Magnesium -     CBC w/Diff/Platelet -     Sed Rate (ESR)  Other orders -     Magnesium -     NON-GYN, SPECIMEN A -     Magnesium, Urine with Creatinine   Continue to follow up with pulmonology of SOB.  6 minute walk test reassuring.  Labs reassuring.   Labs ordered for bacteruria.  Discussed NOT treating bacteria with abx.  Follow up with urogynocology or gastroenterology.   Memory changes with vision changes with balance issues.  MRI ordered MOCA mild impairment.    Spent 50 minutes with patient reviewing chart, discussing other providers notes, discussing medications and symptoms and coming up with plan.    [name], PA-C

## 2022-06-21 ENCOUNTER — Encounter: Payer: Self-pay | Admitting: Physician Assistant

## 2022-06-22 ENCOUNTER — Encounter: Payer: Self-pay | Admitting: Physician Assistant

## 2022-06-22 LAB — NON-GYN, SPECIMEN A

## 2022-06-22 LAB — CYTOLOGY - NON PAP

## 2022-06-22 NOTE — Progress Notes (Signed)
Inflammation rate low.  Magnesium in blood in great.

## 2022-06-22 NOTE — Progress Notes (Signed)
GREAT news before the weekend. No malignant cells found. No cancer in urine.

## 2022-06-23 ENCOUNTER — Encounter: Payer: Self-pay | Admitting: Physician Assistant

## 2022-06-24 ENCOUNTER — Encounter: Payer: Self-pay | Admitting: Infectious Disease

## 2022-06-24 LAB — CBC WITH DIFFERENTIAL/PLATELET
Absolute Monocytes: 323 cells/uL (ref 200–950)
Basophils Absolute: 39 cells/uL (ref 0–200)
Basophils Relative: 0.9 %
Eosinophils Absolute: 22 cells/uL (ref 15–500)
Eosinophils Relative: 0.5 %
HCT: 37.3 % (ref 35.0–45.0)
Hemoglobin: 12.9 g/dL (ref 11.7–15.5)
Lymphs Abs: 808 cells/uL — ABNORMAL LOW (ref 850–3900)
MCH: 32.3 pg (ref 27.0–33.0)
MCHC: 34.6 g/dL (ref 32.0–36.0)
MCV: 93.3 fL (ref 80.0–100.0)
MPV: 10.6 fL (ref 7.5–12.5)
Monocytes Relative: 7.5 %
Neutro Abs: 3109 cells/uL (ref 1500–7800)
Neutrophils Relative %: 72.3 %
Platelets: 284 10*3/uL (ref 140–400)
RBC: 4 10*6/uL (ref 3.80–5.10)
RDW: 11.7 % (ref 11.0–15.0)
Total Lymphocyte: 18.8 %
WBC: 4.3 10*3/uL (ref 3.8–10.8)

## 2022-06-24 LAB — MAGNESIUM: Magnesium: 2.3 mg/dL (ref 1.5–2.5)

## 2022-06-24 LAB — ANA SCREEN,IFA,REFLEX TITER/PATTERN,REFLEX MPLX 11 AB CASCADE
Anti Nuclear Antibody (ANA): NEGATIVE
Cyclic Citrullin Peptide Ab: <16 UNITS
MUTATED CITRULLINATED VIMENTIN (MCV) AB: <20 U/mL (ref <20)
Rheumatoid fact SerPl-aCnc: <14 IU/mL (ref <14)

## 2022-06-24 LAB — SEDIMENTATION RATE: Sed Rate: 9 mm/h (ref 0–30)

## 2022-06-25 ENCOUNTER — Encounter: Payer: Self-pay | Admitting: Physician Assistant

## 2022-06-25 ENCOUNTER — Encounter: Payer: Self-pay | Admitting: Infectious Disease

## 2022-06-25 LAB — MAGNESIUM, URINE WITH CREATININE
CREATININE, RANDOM URINE: 75 mg/dL (ref 20–275)
MAGNESIUM, RANDOM URINE: 24 mg/g creat (ref 22–130)

## 2022-06-25 NOTE — Progress Notes (Signed)
ANA is negative. No sign of auto-antibodies in blood.

## 2022-06-25 NOTE — Telephone Encounter (Signed)
Patient has been advised on the phone to discuss the medication that was recommended by [facility] with their office.

## 2022-06-26 ENCOUNTER — Encounter: Payer: Self-pay | Admitting: Physician Assistant

## 2022-06-26 DIAGNOSIS — R413 Other amnesia: Secondary | ICD-10-CM | POA: Insufficient documentation

## 2022-06-26 NOTE — Progress Notes (Signed)
Magnesium is normal in the urine.

## 2022-06-27 ENCOUNTER — Encounter: Payer: Self-pay | Admitting: Physician Assistant

## 2022-06-28 ENCOUNTER — Encounter: Payer: Self-pay | Admitting: Physician Assistant

## 2022-06-28 DIAGNOSIS — Z8249 Family history of ischemic heart disease and other diseases of the circulatory system: Secondary | ICD-10-CM

## 2022-06-29 ENCOUNTER — Encounter: Payer: Self-pay | Admitting: Physician Assistant

## 2022-06-30 ENCOUNTER — Encounter: Payer: Self-pay | Admitting: Physician Assistant

## 2022-07-02 ENCOUNTER — Encounter: Payer: Self-pay | Admitting: Physician Assistant

## 2022-07-03 ENCOUNTER — Ambulatory Visit (INDEPENDENT_AMBULATORY_CARE_PROVIDER_SITE_OTHER): Payer: Medicare Other

## 2022-07-03 DIAGNOSIS — H539 Unspecified visual disturbance: Secondary | ICD-10-CM

## 2022-07-03 DIAGNOSIS — G4452 New daily persistent headache (NDPH): Secondary | ICD-10-CM

## 2022-07-03 DIAGNOSIS — R413 Other amnesia: Secondary | ICD-10-CM

## 2022-07-03 NOTE — Telephone Encounter (Signed)
Can you send all her urinary records to number provided please

## 2022-07-03 NOTE — Progress Notes (Signed)
Normal MRI

## 2022-07-04 ENCOUNTER — Encounter: Payer: Self-pay | Admitting: Physician Assistant

## 2022-07-04 NOTE — Telephone Encounter (Signed)
Sent request to [facility] since it's related to referral. Thanks Lilia Pro!

## 2022-07-09 ENCOUNTER — Encounter: Payer: Self-pay | Admitting: Physician Assistant

## 2022-07-09 NOTE — Telephone Encounter (Signed)
During the transition time, Denita and Lilia Pro are currently working together on Ambulatory/Specialty referrals. Imaging referrals will be handled by me. The imaging referrals were auth and completed for the patient. She has been scheduled tomorrow to complete her imaging study.   I am forwarding this request as I am unfamiliar with the process of Ambulatory referrals. Hosie Spangle, this request is pertaining to an Infectious Disease referral ordered in October. Thanks in advance.

## 2022-07-10 ENCOUNTER — Ambulatory Visit (INDEPENDENT_AMBULATORY_CARE_PROVIDER_SITE_OTHER): Payer: Self-pay

## 2022-07-10 DIAGNOSIS — Z8249 Family history of ischemic heart disease and other diseases of the circulatory system: Secondary | ICD-10-CM

## 2022-07-10 NOTE — Telephone Encounter (Signed)
Hello! All records have been printed and are set to fax at the number provided by the patient. Jamie [name]

## 2022-07-11 NOTE — Telephone Encounter (Signed)
Last year's worth of related records were faxed yesterday to number provided by the patient in another advice request thread. Jamie [name]

## 2022-07-13 ENCOUNTER — Encounter: Payer: Self-pay | Admitting: Family Medicine

## 2022-07-13 ENCOUNTER — Encounter: Payer: Self-pay | Admitting: Physician Assistant

## 2022-07-13 DIAGNOSIS — I700 Atherosclerosis of aorta: Secondary | ICD-10-CM | POA: Insufficient documentation

## 2022-07-13 NOTE — Telephone Encounter (Signed)
Records have already been faxed at the number provided. LVM after they were faxed but called again today and notified her. Jamie [name]

## 2022-07-13 NOTE — Telephone Encounter (Signed)
Please notify patient directly.  You are welcome to respond on MyChart.  If you are not sure how to do that [place] please let me know.

## 2022-07-13 NOTE — Progress Notes (Signed)
Hi Jamie [name], I am covering for [name] while she is out of the office.  Your calcium score was 49 this puts you into the 65th percentile for women who are your age.  So a little bit above normal.  Recommendation would be for you to continue taking your statin.  It looks like you take atorvastatin 20 mg.  It would be reasonable to consider increasing your dose to 40 mg to maximize plaque reduction.  They also noted small tiny 3 mm pulmonary nodules.  But these do not look worrisome in appearance.  No further follow-up on these nodules are needed.

## 2022-07-16 ENCOUNTER — Encounter: Payer: Self-pay | Admitting: Physician Assistant

## 2022-07-16 NOTE — Telephone Encounter (Signed)
Notified patient verbally over the phone on 12/01, apologies for the lack of clarity. Jamie [name]

## 2022-07-18 ENCOUNTER — Encounter: Payer: Self-pay | Admitting: Physician Assistant

## 2022-07-18 NOTE — Telephone Encounter (Signed)
He is also welcome to schedule an appt. I don't know who Rica Mote is?  Last name?

## 2022-07-18 NOTE — Telephone Encounter (Signed)
Patient scheduled for Monday.

## 2022-07-22 ENCOUNTER — Encounter: Payer: Self-pay | Admitting: Physician Assistant

## 2022-07-23 ENCOUNTER — Ambulatory Visit (INDEPENDENT_AMBULATORY_CARE_PROVIDER_SITE_OTHER): Payer: Medicare Other | Admitting: Physician Assistant

## 2022-07-23 VITALS — BP 127/70 | HR 94 | Temp 98.1°F | Ht 64.0 in | Wt 139.0 lb

## 2022-07-23 DIAGNOSIS — R8271 Bacteriuria: Secondary | ICD-10-CM | POA: Diagnosis not present

## 2022-07-23 LAB — POCT URINALYSIS DIP (CLINITEK)
Bilirubin, UA: NEGATIVE
Blood, UA: NEGATIVE
Glucose, UA: NEGATIVE mg/dL
Ketones, POC UA: NEGATIVE mg/dL
Nitrite, UA: NEGATIVE
POC PROTEIN,UA: NEGATIVE
Spec Grav, UA: >=1.03 — AB (ref 1.010–1.025)
Urobilinogen, UA: 0.2 E.U./dL
pH, UA: 6 (ref 5.0–8.0)

## 2022-07-23 NOTE — Progress Notes (Unsigned)
   Subjective:    Patient ID: Jamie [name], female    DOB: 03/09/1951, 71 y.o.   MRN: [id number]  HPI pt here for foul smelling urine and frequency for 4 days now. Pt denies recent antibiotic use or recent catheterization. Pt denies chills, sweats, pelvic or flank pain.     Review of Systems     Objective:   Physical Exam        Assessment & Plan:   Dysuria, UA completed, urine culture pending.

## 2022-07-24 NOTE — Progress Notes (Signed)
Pt has asymptomatic bacteruria. She comes in for colony count. Pt is not symptomatic.   .. Results for orders placed or performed in visit on 07/23/22  POCT URINALYSIS DIP (CLINITEK)  Result Value Ref Range   Color, UA yellow yellow   Clarity, UA cloudy (A) clear   Glucose, UA negative negative mg/dL   Bilirubin, UA negative negative   Ketones, POC UA negative negative mg/dL   Spec Grav, UA >=1.030 (A) 1.010 - 1.025   Blood, UA negative negative   pH, UA 6.0 5.0 - 8.0   POC PROTEIN,UA negative negative, trace   Urobilinogen, UA 0.2 0.2 or 1.0 E.U./dL   Nitrite, UA Negative Negative   Leukocytes, UA Large (3+) (A) Negative   *Note: Due to a large number of results and/or encounters for the requested time period, some results have not been displayed. A complete set of results can be found in Results Review.   Will culture.  Suggested follow up with urology and/or urogynocology for further management. Plan is to not treat with abx unless symptomatic.

## 2022-07-25 ENCOUNTER — Telehealth: Payer: Self-pay | Admitting: *Deleted

## 2022-07-25 ENCOUNTER — Encounter: Payer: Self-pay | Admitting: Physician Assistant

## 2022-07-25 NOTE — Telephone Encounter (Signed)
Spoke w/Jamie [name] and she would like to get her lymphocytes checked.  Jamie [name] stated that she had spoken to "Dr. [name]" about this before.

## 2022-07-26 LAB — URINE CULTURE
MICRO NUMBER:: 14301558
SPECIMEN QUALITY:: ADEQUATE

## 2022-07-27 ENCOUNTER — Encounter: Payer: Self-pay | Admitting: Physician Assistant

## 2022-07-27 NOTE — Progress Notes (Signed)
E.coli greater than 100,000 colony count. Not as resistant as previous cultures which is always good. Are you having any new symptoms? Do you want me to send this to anyone?

## 2022-07-28 ENCOUNTER — Encounter: Payer: Self-pay | Admitting: Physician Assistant

## 2022-08-08 ENCOUNTER — Ambulatory Visit (INDEPENDENT_AMBULATORY_CARE_PROVIDER_SITE_OTHER): Payer: Medicare Other | Admitting: Physician Assistant

## 2022-08-08 ENCOUNTER — Ambulatory Visit: Payer: Medicare Other

## 2022-08-08 ENCOUNTER — Encounter: Payer: Self-pay | Admitting: Physician Assistant

## 2022-08-08 VITALS — BP 137/67 | HR 88 | Ht 64.0 in | Wt 142.0 lb

## 2022-08-08 DIAGNOSIS — R911 Solitary pulmonary nodule: Secondary | ICD-10-CM

## 2022-08-08 DIAGNOSIS — R8271 Bacteriuria: Secondary | ICD-10-CM

## 2022-08-08 DIAGNOSIS — R590 Localized enlarged lymph nodes: Secondary | ICD-10-CM | POA: Diagnosis not present

## 2022-08-08 DIAGNOSIS — R413 Other amnesia: Secondary | ICD-10-CM

## 2022-08-08 DIAGNOSIS — M545 Low back pain, unspecified: Secondary | ICD-10-CM | POA: Diagnosis not present

## 2022-08-08 LAB — POCT URINALYSIS DIP (CLINITEK)
Bilirubin, UA: NEGATIVE
Blood, UA: NEGATIVE
Glucose, UA: NEGATIVE mg/dL
Ketones, POC UA: NEGATIVE mg/dL
Nitrite, UA: NEGATIVE
POC PROTEIN,UA: NEGATIVE
Spec Grav, UA: <=1.005 — AB (ref 1.010–1.025)
Urobilinogen, UA: 0.2 E.U./dL
pH, UA: 6 (ref 5.0–8.0)

## 2022-08-08 NOTE — Patient Instructions (Addendum)
Get U/S of neck Get xray of low back  Follow up with colorectal surgeon and urogyneocology
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
## 2022-08-09 ENCOUNTER — Encounter: Payer: Self-pay | Admitting: Pulmonary Disease

## 2022-08-09 ENCOUNTER — Ambulatory Visit (INDEPENDENT_AMBULATORY_CARE_PROVIDER_SITE_OTHER): Payer: Medicare Other

## 2022-08-09 ENCOUNTER — Ambulatory Visit (INDEPENDENT_AMBULATORY_CARE_PROVIDER_SITE_OTHER): Payer: Medicare Other | Admitting: Pulmonary Disease

## 2022-08-09 ENCOUNTER — Encounter: Payer: Self-pay | Admitting: Physician Assistant

## 2022-08-09 VITALS — BP 118/74 | HR 76 | Temp 97.7°F | Ht 64.5 in | Wt 143.0 lb

## 2022-08-09 DIAGNOSIS — M545 Low back pain, unspecified: Secondary | ICD-10-CM

## 2022-08-09 DIAGNOSIS — R0609 Other forms of dyspnea: Secondary | ICD-10-CM

## 2022-08-09 DIAGNOSIS — R911 Solitary pulmonary nodule: Secondary | ICD-10-CM | POA: Diagnosis not present

## 2022-08-09 DIAGNOSIS — R590 Localized enlarged lymph nodes: Secondary | ICD-10-CM

## 2022-08-09 DIAGNOSIS — R1310 Dysphagia, unspecified: Secondary | ICD-10-CM | POA: Diagnosis not present

## 2022-08-09 LAB — CBC WITH DIFFERENTIAL/PLATELET
Absolute Monocytes: 519 cells/uL (ref 200–950)
Basophils Absolute: 49 cells/uL (ref 0–200)
Basophils Relative: 1 %
Eosinophils Absolute: 29 cells/uL (ref 15–500)
Eosinophils Relative: 0.6 %
HCT: 37.9 % (ref 35.0–45.0)
Hemoglobin: 13.4 g/dL (ref 11.7–15.5)
Lymphs Abs: 1058 cells/uL (ref 850–3900)
MCH: 32 pg (ref 27.0–33.0)
MCHC: 35.4 g/dL (ref 32.0–36.0)
MCV: 90.5 fL (ref 80.0–100.0)
MPV: 10.8 fL (ref 7.5–12.5)
Monocytes Relative: 10.6 %
Neutro Abs: 3244 cells/uL (ref 1500–7800)
Neutrophils Relative %: 66.2 %
Platelets: 308 10*3/uL (ref 140–400)
RBC: 4.19 10*6/uL (ref 3.80–5.10)
RDW: 11.6 % (ref 11.0–15.0)
Total Lymphocyte: 21.6 %
WBC: 4.9 10*3/uL (ref 3.8–10.8)

## 2022-08-09 LAB — PULMONARY FUNCTION TEST
DL/VA % pred: 106 %
DL/VA: 4.36 ml/min/mmHg/L
DLCO cor % pred: 83 %
DLCO cor: 16.89 ml/min/mmHg
DLCO unc % pred: 83 %
DLCO unc: 16.89 ml/min/mmHg
FEF 25-75 Pre: 2.36 L/sec
FEF2575-%Pred-Pre: 117 %
FEV1-%Pred-Pre: 88 %
FEV1-Pre: 2.12 L
FEV1FVC-%Pred-Pre: 107 %
FEV6-%Pred-Pre: 85 %
FEV6-Pre: 2.57 L
FEV6FVC-%Pred-Pre: 104 %
FVC-%Pred-Pre: 82 %
FVC-Pre: 2.59 L
Pre FEV1/FVC ratio: 82 %
Pre FEV6/FVC Ratio: 100 %
RV % pred: 89 %
RV: 1.99 L
TLC % pred: 89 %
TLC: 4.64 L

## 2022-08-09 NOTE — Patient Instructions (Addendum)
Attempted Full PFT Today, Performed Spirometry, DLCO, and Plethysmography (Lung Volumes) due to patient request.

## 2022-08-09 NOTE — Progress Notes (Signed)
Jamie [name]    [id number]    Jul 20, 1951  Primary Care Physician:Breeback, Royetta Car, PA-C  Referring Physician: [name], PA-C [place] [place],  [place] [address]  Chief complaint:   Referred for spot on her lung, was told she has COPD  HPI:  Patient was told during recent hospital visit that she has a spot on her lung from a recent chest x-ray and concern for COPD  She is overall feeling stable breathing is back to its usual   Quit smoking over 30 years ago, was a social smoker  Work in [employer]  No significant other concerns today  Had a PFT today  She did have a cardiac CT showing nodules less than 3 mm  She was very active with no limitations prior to the surgery and urinary tract infection which she has been dealing with since January  Outpatient Encounter Medications as of 08/09/2022  Medication Sig   ALPRAZolam (XANAX) 0.5 MG tablet Take 1 tablet (0.5 mg total) by mouth 2 (two) times daily as needed for sleep.   ASPIRIN LOW DOSE 81 MG tablet Take 81 mg by mouth daily.   atorvastatin (LIPITOR) 20 MG tablet Take 20 mg by mouth daily.   b complex vitamins capsule Take 1 capsule by mouth daily.   Blood Glucose Monitoring Suppl (ACCU-CHEK GUIDE ME) w/Device KIT Dx R73.09 Elevated glucose - Check blood sugar 4 times daily.   cholecalciferol (VITAMIN D3) 25 MCG (1000 UNIT) tablet Take 1,000 Units by mouth daily.   clobetasol cream (TEMOVATE) 1.91 % Apply 1 Application topically 2 (two) times daily.   conjugated estrogens (PREMARIN) vaginal cream APPLY VAGINALLY 3 TIMES A WEEK AT NIGHT   diclofenac (VOLTAREN) 75 MG EC tablet TAKE 1 TABLET(75 MG) BY MOUTH TWICE DAILY   EPINEPHrine 0.3 mg/0.3 mL IJ SOAJ injection Inject 0.3 mg into the muscle as needed for anaphylaxis.   estradiol (ESTRACE) 0.1 MG/GM vaginal cream Place 1 Applicatorful vaginally daily.   gabapentin (NEURONTIN) 100 MG capsule Take 1 capsule (100 mg total)  by mouth 3 (three) times daily.   ibuprofen (ADVIL) 800 MG tablet Take 1 tablet (800 mg total) by mouth every 8 (eight) hours as needed.   L-Methylfolate-B6-B12 (FOLTANX) 3-35-2 MG TABS Take 1 tablet by mouth in the morning and at bedtime.   Lancets 30G MISC Dx R73.09 Elevated glucose - Check blood sugar 4 times daily.   levothyroxine (SYNTHROID) 75 MCG tablet TAKE 1 TABLET(75 MCG) BY MOUTH DAILY BEFORE BREAKFAST   lidocaine (LIDODERM) 5 % Place 1 patch onto the skin daily. Remove & Discard patch within 12 hours or as directed by MD   lidocaine (XYLOCAINE) 5 % ointment Apply 1 application topically daily.   linaclotide (LINZESS) 72 MCG capsule Take 1 capsule (72 mcg total) by mouth daily before breakfast.   meclizine (ANTIVERT) 25 MG tablet Take 1 tablet (25 mg total) by mouth 3 (three) times daily as needed for dizziness.   OVER THE COUNTER MEDICATION Take 2 capsules by mouth daily. Friendly four   pantoprazole (PROTONIX) 40 MG tablet Take 40 mg by mouth in the morning and at bedtime. As needed   PEPCID 20 MG tablet Take 1 tablet (20 mg total) by mouth 2 (two) times daily. BRAND ONLY. (Patient taking differently: Take 20 mg by mouth 2 (two) times daily as needed for heartburn or indigestion. BRAND ONLY.)   promethazine (PHENERGAN) 25  MG tablet Take 1 tablet (25 mg total) by mouth every 6 (six) hours as needed for nausea or vomiting.   albuterol (PROVENTIL HFA) 108 (90 Base) MCG/ACT inhaler Inhale 2 puffs into the lungs every 6 (six) hours as needed for wheezing or shortness of breath. (Patient not taking: Reported on 08/09/2022)   No facility-administered encounter medications on file as of 08/09/2022.    Allergies as of 08/09/2022 - Review Complete 08/09/2022  Allergen Reaction Noted   Bactrim [sulfamethoxazole-trimethoprim] Anaphylaxis 10/14/2021   Dairycare [lactase-lactobacillus] Anaphylaxis 08/21/2021   Ciprofloxacin  01/31/2012   Iodine Rash 05/15/2017   Iodinated contrast media Other  (See Comments) 11/13/2016   Macrobid [nitrofurantoin]  11/20/2021   Peanut butter flavor Nausea And Vomiting 02/13/2012   Shellfish allergy Swelling 10/01/2011    Past Medical History:  Diagnosis Date   Abdominal bloating 08/23/2017   Abnormal finding on urinalysis 02/27/2022   Last Assessment & Plan:  Formatting of this note might be different from the original. 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   Antibiotic-resistant bacterial infection 11/20/2021   Anxiety    Anxiety about health 08/23/2017   ANXIETY DEPRESSION 06/09/2010   Qualifier: Diagnosis of  By: Inda Castle FNP, Melissa S    Asymptomatic bacteriuria 06/11/2022   Atypical chest pain 08/23/2017   B12 deficiency 06/13/2015   Bacteriuria 11/29/2021   Biceps tendonitis on right 04/13/2022   Bilateral headaches    Bilateral tinnitus 12/23/2020   Cardiovascular risk factor 05/08/2017   Carotid stenosis, asymptomatic, bilateral 05/08/2017   Carotid dopplers showed bilateral ICA stenosis 1-39 percent 2016 Minor carotid intimal thickening without significant atherosclerosis. No hemodynamically significant ICA stenosis. Degree of narrowing less than 50% bilaterally. 08/2015  1. Mild (1-49%) stenosis proximal right internal carotid artery secondary to heterogenous atherosclerotic plaque. 2. Mild (1-49%) stenosis proximal left internal car   Carpal tunnel syndrome 10/17/2015   Cataract    bilateral   Cervical  radiculopathy 10/01/2018   Xray 09/2018: cervical DDD, disc height loss at C5 and C6.    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   Constipation 07/19/2015   DDD (degenerative disc disease), cervical 08/31/2015   Depression with anxiety 06/18/2015   Diverticulitis of large intestine with abscess without bleeding    Diverticulosis    Diverticulosis of large intestine 07/03/2011   Dyspnea    Elevated random blood glucose level 11/29/2021   Epigastric pain 07/06/2019   ETD (Eustachian tube dysfunction), right 06/11/2018   Exposure to chemical inhalation 11/06/2016   Exposure to hepatitis C 06/21/2021   Exposure to TB 06/21/2021   Extrinsic asthma, unspecified    Family history of renal cancer 07/20/2015   Fatigue 01/15/2018   Former smoker 05/14/2016   GAD (generalized anxiety disorder) 10/12/2019   Gastritis and duodenitis 04/15/2017   Generalized abdominal pain 01/24/2014   Genitourinary syndrome of menopause 02/27/2022   Formatting of this  note might be different from the original. On premarin cream  Last Assessment & Plan:  Formatting of this note might be different from the original. - continue premarin cream 2-3x per week   GERD (gastroesophageal reflux disease)    Hair loss 07/20/2015   Heel pain, bilateral 01/15/2018   Hematuria 01/25/2017   Herpes zoster without complication 25/63/8937   History of excessive cerumen 05/08/2017   History of shingles 07/19/2015   Hives of unknown origin 10/12/2019   Hx of diverticulitis of colon 01/25/2017   Hyperlipidemia    Hyperpigmentation of skin 07/19/2015   Hypopigmentation 01/11/2016   IBS (irritable bowel syndrome)    Impacted cerumen, bilateral 02/09/2020   Iron deficiency anemia secondary to inadequate dietary iron intake 08/23/2017   Left arm numbness 10/13/2015   Left ear impacted cerumen 12/23/2020   Leg cramping 12/23/2020   Low iron stores 07/19/2015   Lung mass    Mid back pain on left  side 07/20/2015   Migraine with aura and with status migrainosus, not intractable    Moderate protein-calorie malnutrition ([place]) 10/07/2019   Multiple food allergies 01/11/2016   Muscle strain of upper back 01/15/2018   Nausea 01/29/2012   Neck pain 10/13/2015   Neuropathy 02/02/2020   Normocytic anemia 06/18/2015   Numbness and tingling of right side of face 12/31/2018   OAB (overactive bladder) 09/21/2019   ONYCHOMYCOSIS, TOENAILS 06/09/2010   Qualifier: Diagnosis of  By: Inda Castle FNP, Melissa S    Osteopenia 01/20/2014   Osteoporosis 12/14/2020   -2.8. discussed starting medication.    Pelvic floor tension 02/27/2022   Formatting of this note might be different from the original. 02/27/2022 very tender on exam, particularly on the right  Last Assessment & Plan:  Formatting of this note might be different from the original. We discussed the pathophysiology of levator spasm, which can be triggered by any painful or traumatic pelvic episode, such as a severe UTI, pelvic surgery, sexual abuse, or chronic conditions.    Pelvic floor weakness 09/16/2017   Pernicious anemia 10/13/2015   Pneumaturia 02/27/2022   Pneumonia    Post herpetic neuralgia 06/21/2021   Pre-diabetes    Rectocele 02/27/2022   Formatting of this note might be different from the original. 02/27/2022  Describes a bulge or pocket in her vagina, has to splint for BM. Worsened by bearing down.    Last Assessment & Plan:  Formatting of this note might be different from the original. No evidence of prolapse/rectocele on exam today. CTM, meanwhile advised pt to avoid splinting, optimize diet, and f/u with pelvic floor PT   Recurrent infections 09/25/2019   Recurrent urinary tract infection 01/29/2012   Right knee pain 11/05/2016   Right-sided headache    S/P laparoscopic-assisted sigmoidectomy 09/08/2021   Seasonal allergic rhinitis due to pollen 09/25/2019   Seasonal and perennial allergic rhinitis 11/06/2011   Allergy skin test 02/13/2012: Mild to  moderate reactions mainly for grass, weed, tree pollens, house dust/mite Allergy profile 12/20/2011: Total IgE 33.5. No specific elevations including shrimp.   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 treatment plan  repeat scan in one year and then if stable at that point we'll repeat every 2-3 years for monitoring.   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   Thyroid disease    Unintentional  weight loss 07/22/2020   Urinary frequency 06/13/2018   Vaginal dryness 01/11/2016   Vaginal irritation 09/15/2018   Viral respiratory infection 12/04/2019   Vision changes 01/25/2017   Vitamin D deficiency 08/23/2017    Past Surgical History:  Procedure Laterality Date   ABDOMINAL HYSTERECTOMY     COLONOSCOPY     CYSTOSCOPY WITH STENT PLACEMENT Bilateral 09/08/2021   Procedure: CYSTOSCOPY WITH STENT PLACEMENT;  Surgeon: Irine Seal, MD;  Location: [facility];  Service: Urology;  Laterality: Bilateral;   FLEXIBLE SIGMOIDOSCOPY N/A 09/08/2021   Procedure: FLEXIBLE SIGMOIDOSCOPY;  Surgeon: Ileana Roup, MD;  Location: [facility];  Service: General;  Laterality: N/A;   HEMORRHOID SURGERY     LYSIS OF ADHESION N/A 09/08/2021   Procedure: LYSIS OF ADHESION;  Surgeon: Ileana Roup, MD;  Location: [facility];  Service: General;  Laterality: N/A;   TUBAL LIGATION     TUMOR REMOVAL     Left thigh     Family History  Problem Relation Age of Onset   Diabetes Mother    Liver cancer Mother        renal cell    Heart disease Father        first MI in 42s   Cervical cancer Sister    Allergic rhinitis Sister    Asthma Sister    Heart disease Brother        first MI in 8s   Alcohol abuse Sister    Fibromyalgia Sister    Pancreatic cancer Sister 99   Arthritis Sister        osteoarthritis   Emphysema Sister    Irritable  bowel syndrome Sister    Emphysema Sister    Leukemia Other        grandson   Urticaria Daughter    Allergic rhinitis Daughter    Stomach cancer Neg Hx    Colon cancer Neg Hx    Angioedema Neg Hx    Eczema Neg Hx    Immunodeficiency Neg Hx    Colon polyps Neg Hx    Esophageal cancer Neg Hx    Rectal cancer Neg Hx     Social History   Socioeconomic History   Marital status: Single    Spouse name: Not on file   Number of children: 3   Years of education: 14   Highest education level: Associate degree: academic program  Occupational History   Occupation: Ship broker   Occupation: retired.  Tobacco Use   Smoking status: Former    Packs/day: 0.80    Years: 20.00    Total pack years: 16.00    Types: Cigarettes    Quit date: 08/13/1985    Years since quitting: 37.0   Smokeless tobacco: Never  Vaping Use   Vaping Use: Never used  Substance and Sexual Activity   Alcohol use: Yes    Comment: rare   Drug use: No   Sexual activity: Not Currently    Partners: Male    Birth control/protection: None  Other Topics Concern   Not on file  Social History Narrative   Her daughter is staying with her since her surgery. She enjoys shopping, exercise (when she can) and enjoys word finder.    Social Determinants of Health   Financial Resource Strain: Low Risk  (10/13/2021)   Overall Financial Resource Strain (CARDIA)    Difficulty of Paying Living Expenses: Not hard at all  Food Insecurity: No Food Insecurity (10/13/2021)   Hunger Vital Sign  Worried About Charity fundraiser in the Last Year: Never true    [place] in the Last Year: Never true  Transportation Needs: No Transportation Needs (10/13/2021)   PRAPARE - Hydrologist (Medical): No    Lack of Transportation (Non-Medical): No  Physical Activity: Inactive (10/13/2021)   Exercise Vital Sign    Days of Exercise per Week: 0 days    Minutes of Exercise per Session: 0 min  Stress: No Stress Concern  Present (10/13/2021)   [place]    Feeling of Stress : Not at all  Social Connections: Moderately Isolated (10/13/2021)   Social Connection and Isolation Panel [NHANES]    Frequency of Communication with Friends and Family: More than three times a week    Frequency of Social Gatherings with Friends and Family: Once a week    Attends Religious Services: More than 4 times per year    Active Member of [employer] or Organizations: No    Attends Archivist Meetings: Never    Marital Status: Divorced  Human resources officer Violence: Not At Risk (10/13/2021)   Humiliation, Afraid, Rape, and Kick questionnaire    Fear of Current or Ex-Partner: No    Emotionally Abused: No    Physically Abused: No    Sexually Abused: No    Review of Systems  Respiratory:  Positive for shortness of breath.     Vitals:   08/09/22 1104  BP: 118/74  Pulse: 76  Temp: 97.7 F (36.5 C)  SpO2: 99%     Physical Exam Constitutional:      Appearance: Normal appearance.  HENT:     Head: Normocephalic.     Mouth/Throat:     Mouth: Mucous membranes are moist.  Eyes:     Pupils: Pupils are equal, round, and reactive to light.  Cardiovascular:     Rate and Rhythm: Normal rate and regular rhythm.     Heart sounds: No murmur heard. Pulmonary:     Effort: No respiratory distress.     Breath sounds: No stridor. No wheezing or rhonchi.  Musculoskeletal:     Cervical back: No rigidity or tenderness.  Neurological:     Mental Status: She is alert.  Psychiatric:        Mood and Affect: Mood normal.   Data Reviewed: PFT from 2019 did show mild obstructive disease  CT scan of the chest from 05/29/2021 reviewed showing centrilobular emphysema Cardiac CT films reviewed with the patient  Assessment:  Abnormal CT scan of the chest showing centrilobular emphysema  PFT with obstructive disease  Breathing has been relatively  stable    Plan/Recommendations: Continue graded activities as tolerated  I will see her back in about 6 months  Not starting any inhalers at present  Call with any significant concerns Sherrilyn Rist MD [facility] 08/09/2022, 11:12 AM  CC: [name], PA-C

## 2022-08-09 NOTE — Progress Notes (Signed)
Attempted Full PFT Today, Performed Spirometry, DLCO, and Plethysmography (Lung Volumes) due to patient request.

## 2022-08-09 NOTE — Patient Instructions (Signed)
I will see you back in about 6 months  Call with significant concerns  Continue to stay active  Breathing study is within normal limits  3 mm nodules are not significant to be worried about No need for any CAT scans  Call with significant concerns

## 2022-08-09 NOTE — Progress Notes (Signed)
WBC looks great. Lymphocytes are back in normal range. Lab looks great. No sign of any systemic infection.

## 2022-08-10 ENCOUNTER — Encounter: Payer: Self-pay | Admitting: Physician Assistant

## 2022-08-10 LAB — URINE CULTURE
MICRO NUMBER:: 14360510
SPECIMEN QUALITY:: ADEQUATE

## 2022-08-10 NOTE — Progress Notes (Signed)
Great news. Very low colony count of gram negative bacteria. No further antibiotic treatment needed.

## 2022-08-14 ENCOUNTER — Encounter: Payer: Self-pay | Admitting: Physician Assistant

## 2022-08-14 DIAGNOSIS — M5136 Other intervertebral disc degeneration, lumbar region: Secondary | ICD-10-CM | POA: Insufficient documentation

## 2022-08-14 DIAGNOSIS — M51369 Other intervertebral disc degeneration, lumbar region without mention of lumbar back pain or lower extremity pain: Secondary | ICD-10-CM | POA: Insufficient documentation

## 2022-08-14 NOTE — Progress Notes (Signed)
Small thyroid nodules as seen before and no need for follow up. No other concerning lymph nodes. Great news.

## 2022-08-14 NOTE — Progress Notes (Signed)
Confirmed lumbar degenerative disc disease which is arthritis of the lumbar spine and can cause chronic low back pain and surrounding muscle guarding.  Voltaren should help.

## 2022-08-19 ENCOUNTER — Other Ambulatory Visit: Payer: Self-pay | Admitting: Physician Assistant

## 2022-08-19 DIAGNOSIS — F419 Anxiety disorder, unspecified: Secondary | ICD-10-CM

## 2022-08-20 ENCOUNTER — Encounter: Payer: Self-pay | Admitting: Physician Assistant

## 2022-08-20 NOTE — Telephone Encounter (Signed)
Last written 06/13/2022 #60 with 1 refill

## 2022-08-21 ENCOUNTER — Encounter: Payer: Self-pay | Admitting: Physician Assistant

## 2022-08-24 ENCOUNTER — Encounter: Payer: Self-pay | Admitting: Physician Assistant

## 2022-09-14 ENCOUNTER — Other Ambulatory Visit: Payer: Self-pay | Admitting: Physician Assistant

## 2022-09-18 ENCOUNTER — Encounter: Payer: Self-pay | Admitting: Physician Assistant

## 2022-10-04 ENCOUNTER — Encounter: Payer: Self-pay | Admitting: Physician Assistant

## 2022-10-04 ENCOUNTER — Other Ambulatory Visit: Payer: Self-pay | Admitting: Physician Assistant

## 2022-10-04 DIAGNOSIS — E039 Hypothyroidism, unspecified: Secondary | ICD-10-CM

## 2022-10-04 NOTE — Telephone Encounter (Signed)
Jamie [name] is the patient. He needs appt before abx can be sent. [place] for [employer] even if have to double book.

## 2022-10-04 NOTE — Telephone Encounter (Signed)
Jamie [name] informed and patient schld or tomorrow 10/05/22 at 1pm virtual visit with [name]

## 2022-10-09 ENCOUNTER — Other Ambulatory Visit: Payer: Self-pay | Admitting: Physician Assistant

## 2022-10-09 DIAGNOSIS — Z0000 Encounter for general adult medical examination without abnormal findings: Secondary | ICD-10-CM

## 2022-10-09 DIAGNOSIS — Z1231 Encounter for screening mammogram for malignant neoplasm of breast: Secondary | ICD-10-CM

## 2022-10-15 ENCOUNTER — Telehealth: Payer: Self-pay | Admitting: Neurology

## 2022-10-15 NOTE — Telephone Encounter (Signed)
Patient left vm on my line about canceling her appt tomorrow. If someone could call her to discuss.

## 2022-10-16 ENCOUNTER — Ambulatory Visit (INDEPENDENT_AMBULATORY_CARE_PROVIDER_SITE_OTHER): Payer: 59 | Admitting: Physician Assistant

## 2022-10-16 DIAGNOSIS — Z0000 Encounter for general adult medical examination without abnormal findings: Secondary | ICD-10-CM

## 2022-10-16 DIAGNOSIS — Z1231 Encounter for screening mammogram for malignant neoplasm of breast: Secondary | ICD-10-CM

## 2022-10-16 DIAGNOSIS — Z780 Asymptomatic menopausal state: Secondary | ICD-10-CM

## 2022-10-16 NOTE — Patient Instructions (Signed)
[facility] Maintenance Summary and Written Plan of Care  Ms. Jamie [name] ,  Thank you for allowing me to perform your Medicare Annual Wellness Visit and for your ongoing commitment to your health.   Health Maintenance & Immunization History Health Maintenance  Topic Date Due   DTaP/Tdap/Td (2 - Tdap) 08/14/2015   INFLUENZA VACCINE  11/11/2022 (Originally 03/13/2022)   Zoster Vaccines- Shingrix (1 of 2) 01/16/2023 (Originally [birth year]/20/1971)   Pneumonia Vaccine 9+ Years old (1 of 2 - PCV) 10/16/2023 (Originally 12/20/1956)   MAMMOGRAM  10/16/2023 (Originally 04/12/2022)   DEXA SCAN  12/15/2022   Medicare Annual Wellness (AWV)  10/16/2023   COLONOSCOPY (Pts 45-62yr Insurance coverage will need to be confirmed)  03/21/2027   Hepatitis C Screening  Completed   HPV VACCINES  Aged Out   COVID-19 Vaccine  Discontinued   Immunization History  Administered Date(s) Administered   Td 08/13/2005    These are the patient goals that we discussed:  Goals Addressed               This Visit's Progress     Patient Stated (pt-stated)        Patient stated that she would like to do more exercise and meditation.         This is a list of Health Maintenance Items that are overdue or due now: Health Maintenance Due  Topic Date Due   DTaP/Tdap/Td (2 - Tdap) 08/14/2015   Pneumococcal vaccine  Influenza vaccine Td vaccine Shingles vaccine Mammogram Bone density scan  Patient declined the vaccines at this time.  Orders/Referrals Placed Today: Orders Placed This Encounter  Procedures   DEXAScan    Standing Status:   Future    Standing Expiration Date:   10/16/2023    Scheduling Instructions:     Please call patient to schedule. Due in May    Order Specific Question:   Reason for exam:    Answer:   post menopausal    Order Specific Question:   Preferred imaging location?    Answer:   [facility]ammogram 3D SCREEN BREAST BILATERAL    Standing Status:    Future    Standing Expiration Date:   10/16/2023    Scheduling Instructions:     Please call patient to schedule.    Order Specific Question:   Reason for Exam (SYMPTOM  OR DIAGNOSIS REQUIRED)    Answer:   Breast cancer screening    Order Specific Question:   Preferred imaging location?    Answer:   [facility]   (Contact our referral department at [phone] you have not spoken with someone about your referral appointment within the next 5 days)    Follow-up Plan Follow-up with BDonella Stade PA-C as planned Medicare wellness visit in one year. Patient will access AVS on my chart      Health Maintenance, Female Adopting a healthy lifestyle and getting preventive care are important in promoting health and wellness. Ask your health care provider about: The right schedule for you to have regular tests and exams. Things you can do on your own to prevent diseases and keep yourself healthy. What should I know about diet, weight, and exercise? Eat a healthy diet  Eat a diet that includes plenty of vegetables, fruits, low-fat dairy products, and lean protein. Do not eat a lot of foods that are high in solid fats, added sugars, or sodium. Maintain a healthy weight Body mass index (  BMI) is used to identify weight problems. It estimates body fat based on height and weight. Your health care provider can help determine your BMI and help you achieve or maintain a healthy weight. Get regular exercise Get regular exercise. This is one of the most important things you can do for your health. Most adults should: Exercise for at least 150 minutes each week. The exercise should increase your heart rate and make you sweat (moderate-intensity exercise). Do strengthening exercises at least twice a week. This is in addition to the moderate-intensity exercise. Spend less time sitting. Even light physical activity can be beneficial. Watch cholesterol and blood lipids Have your blood  tested for lipids and cholesterol at 70 years of age, then have this test every 5 years. Have your cholesterol levels checked more often if: Your lipid or cholesterol levels are high. You are older than 70 years of age. You are at high risk for heart disease. What should I know about cancer screening? Depending on your health history and family history, you may need to have cancer screening at various ages. This may include screening for: Breast cancer. Cervical cancer. Colorectal cancer. Skin cancer. Lung cancer. What should I know about heart disease, diabetes, and high blood pressure? Blood pressure and heart disease High blood pressure causes heart disease and increases the risk of stroke. This is more likely to develop in people who have high blood pressure readings or are overweight. Have your blood pressure checked: Every 3-5 years if you are 14-16 years of age. Every year if you are 56 years old or older. Diabetes Have regular diabetes screenings. This checks your fasting blood sugar level. Have the screening done: Once every three years after age 10 if you are at a normal weight and have a low risk for diabetes. More often and at a younger age if you are overweight or have a high risk for diabetes. What should I know about preventing infection? Hepatitis B If you have a higher risk for hepatitis B, you should be screened for this virus. Talk with your health care provider to find out if you are at risk for hepatitis B infection. Hepatitis C Testing is recommended for: Everyone born from [birth year] through 1963. Anyone with known risk factors for hepatitis C. Sexually transmitted infections (STIs) Get screened for STIs, including gonorrhea and chlamydia, if: You are sexually active and are younger than 70 years of age. You are older than 70 years of age and your health care provider tells you that you are at risk for this type of infection. Your sexual activity has changed since  you were last screened, and you are at increased risk for chlamydia or gonorrhea. Ask your health care provider if you are at risk. Ask your health care provider about whether you are at high risk for HIV. Your health care provider may recommend a prescription medicine to help prevent HIV infection. If you choose to take medicine to prevent HIV, you should first get tested for HIV. You should then be tested every 3 months for as long as you are taking the medicine. Pregnancy If you are about to stop having your period (premenopausal) and you may become pregnant, seek counseling before you get pregnant. Take 400 to 800 micrograms (mcg) of folic acid every day if you become pregnant. Ask for birth control (contraception) if you want to prevent pregnancy. Osteoporosis and menopause Osteoporosis is a disease in which the bones lose minerals and strength with aging. This can  result in bone fractures. If you are 44 years old or older, or if you are at risk for osteoporosis and fractures, ask your health care provider if you should: Be screened for bone loss. Take a calcium or vitamin D supplement to lower your risk of fractures. Be given hormone replacement therapy (HRT) to treat symptoms of menopause. Follow these instructions at home: Alcohol use Do not drink alcohol if: Your health care provider tells you not to drink. You are pregnant, may be pregnant, or are planning to become pregnant. If you drink alcohol: Limit how much you have to: 0-1 drink a day. Know how much alcohol is in your drink. In the [place] one drink equals one 12 oz bottle of beer (355 mL), one 5 oz glass of wine (148 mL), or one 1 oz glass of hard liquor (44 mL). Lifestyle Do not use any products that contain nicotine or tobacco. These products include cigarettes, chewing tobacco, and vaping devices, such as e-cigarettes. If you need help quitting, ask your health care provider. Do not use street drugs. Do not share  needles. Ask your health care provider for help if you need support or information about quitting drugs. General instructions Schedule regular health, dental, and eye exams. Stay current with your vaccines. Tell your health care provider if: You often feel depressed. You have ever been abused or do not feel safe at home. Summary Adopting a healthy lifestyle and getting preventive care are important in promoting health and wellness. Follow your health care provider's instructions about healthy diet, exercising, and getting tested or screened for diseases. Follow your health care provider's instructions on monitoring your cholesterol and blood pressure. This information is not intended to replace advice given to you by your health care provider. Make sure you discuss any questions you have with your health care provider. Document Revised: 12/19/2020 Document Reviewed: 12/19/2020 [employer] Patient Education  [place].

## 2022-10-16 NOTE — Progress Notes (Signed)
MEDICARE ANNUAL WELLNESS VISIT  10/16/2022  Telephone Visit Disclaimer This Medicare AWV was conducted by telephone due to national recommendations for restrictions regarding the COVID-19 Pandemic (e.g. social distancing).  I verified, using two identifiers, that I am speaking with Jamie [name] or their authorized healthcare agent. I discussed the limitations, risks, security, and privacy concerns of performing an evaluation and management service by telephone and the potential availability of an in-person appointment in the future. The patient expressed understanding and agreed to proceed.  Location of Patient: Home Location of Provider (nurse):  In the office.  Subjective:    Jamie [name] is a 72 y.o. female patient of [name], PA-C who had a Medicare Annual Wellness Visit today via telephone. Jamie [name] is Retired and lives alone. she has 3 children. she reports that she is socially active and does interact with friends/family regularly. she is minimally physically active and enjoys shopping, basketball, enjoys word finder.  Patient Care Team: Lavada Mesi as PCP - General (Family Medicine)     10/16/2022   10:27 AM 05/17/2022    3:24 PM 05/01/2022   11:08 AM 10/14/2021    3:50 PM 10/13/2021   11:11 AM 09/08/2021    1:00 PM 08/29/2021   11:02 AM  Advanced Directives  Does Patient Have a Medical Advance Directive? No No No No No No No  Would patient like information on creating a medical advance directive? No - Patient declined    No - Patient declined No - Patient declined     Hospital Utilization Over the Past 12 Months: # of hospitalizations or ER visits: 0 # of surgeries: 0  Review of Systems    Patient reports that her overall health is better compared to last year.  History obtained from chart review and the patient  Patient Reported Readings (BP, Pulse, CBG, Weight, etc) none  Pain Assessment Pain : No/denies pain     Current Medications & Allergies  (verified) Allergies as of 10/16/2022       Reactions   Bactrim [sulfamethoxazole-trimethoprim] Anaphylaxis   Dairycare [lactase-lactobacillus] Anaphylaxis   Ciprofloxacin    Facial numbness   Iodine Rash   Iodinated Contrast Media Other (See Comments)   Face and chest felt "hot", throat felt tight.    Levaquin [levofloxacin]    rash   Macrobid [nitrofurantoin]    Increased BP/kidney function decline   Peanut Butter Flavor Nausea And Vomiting   N/V   Shellfish Allergy Swelling        Medication List        Accurate as of October 16, 2022 10:43 AM. If you have any questions, ask your nurse or doctor.          Accu-Chek Guide Me w/Device Kit Dx R73.09 Elevated glucose - Check blood sugar 4 times daily.   albuterol 108 (90 Base) MCG/ACT inhaler Commonly known as: Proventil HFA Inhale 2 puffs into the lungs every 6 (six) hours as needed for wheezing or shortness of breath.   ALPRAZolam 0.5 MG tablet Commonly known as: XANAX TAKE 1 TABLET(0.5 MG) BY MOUTH TWICE DAILY AS NEEDED FOR SLEEP   Aspirin Low Dose 81 MG tablet Generic drug: aspirin EC Take 81 mg by mouth daily.   atorvastatin 20 MG tablet Commonly known as: LIPITOR Take 20 mg by mouth daily.   b complex vitamins capsule Take 1 capsule by mouth daily.   cholecalciferol 25 MCG (1000 UNIT) tablet Commonly known as: VITAMIN D3  Take 1,000 Units by mouth daily.   clobetasol cream 0.05 % Commonly known as: TEMOVATE Apply 1 Application topically 2 (two) times daily.   diclofenac 75 MG EC tablet Commonly known as: VOLTAREN TAKE 1 TABLET(75 MG) BY MOUTH TWICE DAILY   EPINEPHrine 0.3 mg/0.3 mL Soaj injection Commonly known as: EPI-PEN Inject 0.3 mg into the muscle as needed for anaphylaxis.   estradiol 0.1 MG/GM vaginal cream Commonly known as: ESTRACE Place 1 Applicatorful vaginally daily.   Foltanx 3-35-2 MG Tabs Take 1 tablet by mouth in the morning and at bedtime.   gabapentin 100 MG  capsule Commonly known as: NEURONTIN Take 1 capsule (100 mg total) by mouth 3 (three) times daily.   ibuprofen 800 MG tablet Commonly known as: ADVIL Take 1 tablet (800 mg total) by mouth every 8 (eight) hours as needed.   Lancets 30G Misc Dx R73.09 Elevated glucose - Check blood sugar 4 times daily.   levothyroxine 75 MCG tablet Commonly known as: SYNTHROID TAKE 1 TABLET(75 MCG) BY MOUTH DAILY BEFORE BREAKFAST   lidocaine 5 % ointment Commonly known as: XYLOCAINE Apply 1 application topically daily.   lidocaine 5 % Commonly known as: Lidoderm Place 1 patch onto the skin daily. Remove & Discard patch within 12 hours or as directed by MD   linaclotide 72 MCG capsule Commonly known as: Linzess Take 1 capsule (72 mcg total) by mouth daily before breakfast.   meclizine 25 MG tablet Commonly known as: ANTIVERT Take 1 tablet (25 mg total) by mouth 3 (three) times daily as needed for dizziness.   OVER THE COUNTER MEDICATION Take 2 capsules by mouth daily. Friendly four   pantoprazole 40 MG tablet Commonly known as: PROTONIX Take 40 mg by mouth in the morning and at bedtime. As needed   Pepcid 20 MG tablet Generic drug: famotidine TAKE 1 TABLET BY MOUTH TWICE DAILY   Premarin vaginal cream Generic drug: conjugated estrogens APPLY VAGINALLY 3 TIMES A WEEK AT NIGHT   promethazine 25 MG tablet Commonly known as: PHENERGAN Take 1 tablet (25 mg total) by mouth every 6 (six) hours as needed for nausea or vomiting.   tobramycin-dexamethasone ophthalmic solution Commonly known as: TOBRADEX Place 1 drop into the right eye 4 (four) times daily.   Vitamin D (Ergocalciferol) 1.25 MG (50000 UNIT) Caps capsule Commonly known as: DRISDOL Take by mouth.        History (reviewed): Past Medical History:  Diagnosis Date   Abdominal bloating 08/23/2017   Abnormal finding on urinalysis 02/27/2022   Last Assessment & Plan:  Formatting of this note might be different from the  original. 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   Antibiotic-resistant bacterial infection 11/20/2021   Anxiety    Anxiety about health 08/23/2017   ANXIETY DEPRESSION 06/09/2010   Qualifier: Diagnosis of  By: Inda Castle FNP, Melissa S    Asymptomatic bacteriuria 06/11/2022   Atypical chest pain 08/23/2017   B12 deficiency  06/13/2015   Bacteriuria 11/29/2021   Biceps tendonitis on right 04/13/2022   Bilateral headaches    Bilateral tinnitus 12/23/2020   Cardiovascular risk factor 05/08/2017   Carotid stenosis, asymptomatic, bilateral 05/08/2017   Carotid dopplers showed bilateral ICA stenosis 1-39 percent 2016 Minor carotid intimal thickening without significant atherosclerosis. No hemodynamically significant ICA stenosis. Degree of narrowing less than 50% bilaterally. 08/2015  1. Mild (1-49%) stenosis proximal right internal carotid artery secondary to heterogenous atherosclerotic plaque. 2. Mild (1-49%) stenosis proximal left internal car   Carpal tunnel syndrome 10/17/2015   Cataract    bilateral   Cervical radiculopathy 10/01/2018   Xray 09/2018: cervical DDD, disc height loss at C5 and C6.    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   Constipation 07/19/2015   DDD (degenerative disc disease), cervical 08/31/2015   Depression with anxiety 06/18/2015   Diverticulitis of large intestine with abscess  without bleeding    Diverticulosis    Diverticulosis of large intestine 07/03/2011   Dyspnea    Elevated random blood glucose level 11/29/2021   Epigastric pain 07/06/2019   ETD (Eustachian tube dysfunction), right 06/11/2018   Exposure to chemical inhalation 11/06/2016   Exposure to hepatitis C 06/21/2021   Exposure to TB 06/21/2021   Extrinsic asthma, unspecified    Family history of renal cancer 07/20/2015   Fatigue 01/15/2018   Former smoker 05/14/2016   GAD (generalized anxiety disorder) 10/12/2019   Gastritis and duodenitis 04/15/2017   Generalized abdominal pain 01/24/2014   Genitourinary syndrome of menopause 02/27/2022   Formatting of this note might be different from the original. On premarin cream  Last Assessment & Plan:  Formatting of this note might be different from the original. - continue premarin cream 2-3x per week   GERD (gastroesophageal reflux disease)    Hair loss 07/20/2015   Heel pain, bilateral 01/15/2018   Hematuria 01/25/2017   Herpes zoster without complication 0000000   History of excessive cerumen 05/08/2017   History of shingles 07/19/2015   Hives of unknown origin 10/12/2019   Hx of diverticulitis of colon 01/25/2017   Hyperlipidemia    Hyperpigmentation of skin 07/19/2015   Hypopigmentation 01/11/2016   IBS (irritable bowel syndrome)    Impacted cerumen, bilateral 02/09/2020   Iron deficiency anemia secondary to inadequate dietary iron intake 08/23/2017   Left arm numbness 10/13/2015   Left ear impacted cerumen 12/23/2020   Leg cramping 12/23/2020   Low iron stores 07/19/2015   Lung mass    Mid back pain on left side 07/20/2015   Migraine with aura and with status migrainosus, not intractable    Moderate protein-calorie malnutrition ([place]) 10/07/2019   Multiple food allergies 01/11/2016   Muscle strain of upper back 01/15/2018   Nausea 01/29/2012   Neck pain 10/13/2015   Neuropathy 02/02/2020   Normocytic anemia 06/18/2015   Numbness and tingling of right side of face  12/31/2018   OAB (overactive bladder) 09/21/2019   ONYCHOMYCOSIS, TOENAILS 06/09/2010   Qualifier: Diagnosis of  By: Inda Castle FNP, Melissa S    Osteopenia 01/20/2014   Osteoporosis 12/14/2020   -2.8. discussed starting medication.    Pelvic floor tension 02/27/2022   Formatting of this note might be different from the original. 02/27/2022 very tender on exam, particularly on the right  Last Assessment & Plan:  Formatting of this note might be different from the original. We discussed the pathophysiology of levator spasm, which can  be triggered by any painful or traumatic pelvic episode, such as a severe UTI, pelvic surgery, sexual abuse, or chronic conditions.    Pelvic floor weakness 09/16/2017   Pernicious anemia 10/13/2015   Pneumaturia 02/27/2022   Pneumonia    Post herpetic neuralgia 06/21/2021   Pre-diabetes    Rectocele 02/27/2022   Formatting of this note might be different from the original. 02/27/2022  Describes a bulge or pocket in her vagina, has to splint for BM. Worsened by bearing down.    Last Assessment & Plan:  Formatting of this note might be different from the original. No evidence of prolapse/rectocele on exam today. CTM, meanwhile advised pt to avoid splinting, optimize diet, and f/u with pelvic floor PT   Recurrent infections 09/25/2019   Recurrent urinary tract infection 01/29/2012   Right knee pain 11/05/2016   Right-sided headache    S/P laparoscopic-assisted sigmoidectomy 09/08/2021   Seasonal allergic rhinitis due to pollen 09/25/2019   Seasonal and perennial allergic rhinitis 11/06/2011   Allergy skin test 02/13/2012: Mild to moderate reactions mainly for grass, weed, tree pollens, house dust/mite Allergy profile 12/20/2011: Total IgE 33.5. No specific elevations including shrimp.   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 treatment plan  repeat scan in one year and then if stable at that  point we'll repeat every 2-3 years for monitoring.   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   Thyroid disease    Unintentional weight loss 07/22/2020   Urinary frequency 06/13/2018   Vaginal dryness 01/11/2016   Vaginal irritation 09/15/2018   Viral respiratory infection 12/04/2019   Vision changes 01/25/2017   Vitamin D deficiency 08/23/2017   Past Surgical History:  Procedure Laterality Date   ABDOMINAL HYSTERECTOMY     COLONOSCOPY     CYSTOSCOPY WITH STENT PLACEMENT Bilateral 09/08/2021   Procedure: CYSTOSCOPY WITH STENT PLACEMENT;  Surgeon: Irine Seal, MD;  Location: [facility];  Service: Urology;  Laterality: Bilateral;   FLEXIBLE SIGMOIDOSCOPY N/A 09/08/2021   Procedure: FLEXIBLE SIGMOIDOSCOPY;  Surgeon: Ileana Roup, MD;  Location: [facility];  Service: General;  Laterality: N/A;   HEMORRHOID SURGERY     LYSIS OF ADHESION N/A 09/08/2021   Procedure: LYSIS OF ADHESION;  Surgeon: Ileana Roup, MD;  Location: [facility];  Service: General;  Laterality: N/A;   TUBAL LIGATION     TUMOR REMOVAL     Left thigh    Family History  Problem Relation Age of Onset   Diabetes Mother    Liver cancer Mother        renal cell    Heart disease Father        first MI in 93s   Cervical cancer Sister    Allergic rhinitis Sister    Asthma Sister    Heart disease Brother        first MI in 70s   Alcohol abuse Sister    Fibromyalgia Sister    Pancreatic cancer Sister 67   Arthritis Sister        osteoarthritis   Emphysema Sister    Irritable bowel syndrome Sister    Emphysema Sister    Leukemia Other        grandson   Urticaria Daughter    Allergic rhinitis Daughter    Stomach cancer Neg Hx  Colon cancer Neg Hx    Angioedema Neg Hx    Eczema Neg Hx    Immunodeficiency Neg Hx    Colon polyps Neg Hx    Esophageal cancer Neg Hx    Rectal cancer Neg Hx    Social History   Socioeconomic  History   Marital status: Single    Spouse name: Not on file   Number of children: 3   Years of education: 14   Highest education level: Associate degree: academic program  Occupational History   Occupation: Ship broker   Occupation: retired.  Tobacco Use   Smoking status: Former    Packs/day: 0.80    Years: 20.00    Total pack years: 16.00    Types: Cigarettes    Quit date: 08/13/1985    Years since quitting: 37.2   Smokeless tobacco: Never  Vaping Use   Vaping Use: Never used  Substance and Sexual Activity   Alcohol use: Yes    Comment: rare   Drug use: No   Sexual activity: Not Currently    Partners: Male    Birth control/protection: None  Other Topics Concern   Not on file  Social History Narrative   Her daughter is staying with her since her surgery. She enjoys shopping, exercise (when she can) and enjoys word finder.    Social Determinants of Health   Financial Resource Strain: Low Risk  (10/16/2022)   Overall Financial Resource Strain (CARDIA)    Difficulty of Paying Living Expenses: Not hard at all  Food Insecurity: No Food Insecurity (10/16/2022)   Hunger Vital Sign    Worried About Running Out of Food in the Last Year: Never true    Ran Out of Food in the Last Year: Never true  Transportation Needs: No Transportation Needs (10/16/2022)   PRAPARE - Hydrologist (Medical): No    Lack of Transportation (Non-Medical): No  Physical Activity: Insufficiently Active (10/16/2022)   Exercise Vital Sign    Days of Exercise per Week: 1 day    Minutes of Exercise per Session: 30 min  Stress: No Stress Concern Present (10/16/2022)   [place]    Feeling of Stress : Only a little  Social Connections: Unknown (10/16/2022)   Social Connection and Isolation Panel [NHANES]    Frequency of Communication with Friends and Family: More than three times a week    Frequency of Social Gatherings with  Friends and Family: Never    Attends Religious Services: More than 4 times per year    Active Member of [employer] or Organizations: No    Attends Archivist Meetings: Never    Marital Status: Patient refused    Activities of Daily Living    10/16/2022   10:35 AM  In your present state of health, do you have any difficulty performing the following activities:  Hearing? 1  Comment some hearing loss  Vision? 0  Difficulty concentrating or making decisions? 1  Comment some difficulty with concentrating  Walking or climbing stairs? 1  Comment little bit  Dressing or bathing? 0  Doing errands, shopping? 0  Preparing Food and eating ? N  Using the Toilet? N  In the past six months, have you accidently leaked urine? Y  Do you have problems with loss of bowel control? N  Managing your Medications? N  Managing your Finances? N  Housekeeping or managing your Housekeeping? N    Patient  Education/ Literacy How often do you need to have someone help you when you read instructions, pamphlets, or other written materials from your doctor or pharmacy?: 1 - Never What is the last grade level you completed in school?: Associates degree  Exercise Current Exercise Habits: The patient does not participate in regular exercise at present, Exercise limited by: None identified  Diet Patient reports consuming  2-3  meals a day and 0 snack(s) a day Patient reports that her primary diet is: Regular Patient reports that she does have regular access to food.   Depression Screen    10/16/2022   10:27 AM 06/11/2022   10:01 AM 04/13/2022   10:53 AM 02/01/2022   10:12 AM 11/29/2021   10:14 AM 10/13/2021   11:11 AM 02/16/2021   12:03 PM  PHQ 2/9 Scores  PHQ - 2 Score 1 0 0 2 2 0 2  PHQ- 9 Score    '3 4  7     '$ Fall Risk    10/16/2022   10:27 AM 06/11/2022   10:01 AM 04/13/2022   10:53 AM 02/01/2022   10:12 AM 11/29/2021    9:26 AM  Fall Risk   Falls in the past year? 0 0 0 0 0  Number falls in past  yr: 0 0 0 0 0  Injury with Fall? 0 0 0 0 0  Risk for fall due to : No Fall Risks  No Fall Risks No Fall Risks No Fall Risks  Follow up Falls evaluation completed  Falls evaluation completed Falls evaluation completed Falls evaluation completed     Objective:  Jamie [name] seemed alert and oriented and she participated appropriately during our telephone visit.  Blood Pressure Weight BMI  BP Readings from Last 3 Encounters:  08/09/22 118/74  08/08/22 137/67  07/23/22 127/70   Wt Readings from Last 3 Encounters:  08/09/22 143 lb (64.9 kg)  08/08/22 142 lb (64.4 kg)  07/23/22 139 lb (63 kg)   BMI Readings from Last 1 Encounters:  08/09/22 24.17 kg/m    *Unable to obtain current vital signs, weight, and BMI due to telephone visit type  Hearing/Vision  Stanton Kidney did not seem to have difficulty with hearing/understanding during the telephone conversation Reports that she has not had a formal eye exam by an eye care professional within the past year Reports that she has not had a formal hearing evaluation within the past year *Unable to fully assess hearing and vision during telephone visit type  Cognitive Function:    10/16/2022   10:36 AM 10/13/2021   11:16 AM  6CIT Screen  What Year? 0 points 0 points  What month? 0 points 0 points  What time? 0 points 0 points  Count back from 20 0 points 0 points  Months in reverse 0 points 0 points  Repeat phrase 2 points 0 points  Total Score 2 points 0 points   (Normal:0-7, Significant for Dysfunction: >8)  Normal Cognitive Function Screening: Yes   Immunization & Health Maintenance Record Immunization History  Administered Date(s) Administered   Td 08/13/2005    Health Maintenance  Topic Date Due   DTaP/Tdap/Td (2 - Tdap) 08/14/2015   INFLUENZA VACCINE  11/11/2022 (Originally 03/13/2022)   Zoster Vaccines- Shingrix (1 of 2) 01/16/2023 (Originally [birth year]/20/1971)   Pneumonia Vaccine 49+ Years old (1 of 2 - PCV) 10/16/2023 (Originally  [birth year]20/1956)   MAMMOGRAM  10/16/2023 (Originally 04/12/2022)   DEXA SCAN  12/15/2022   Medicare Annual Wellness (AWV)  10/16/2023   COLONOSCOPY (Pts 45-74yr Insurance coverage will need to be confirmed)  03/21/2027   Hepatitis C Screening  Completed   HPV VACCINES  Aged Out   COVID-19 Vaccine  Discontinued       Assessment  This is a routine wellness examination for MDeboraha [name]  Health Maintenance: Due or Overdue Health Maintenance Due  Topic Date Due   DTaP/Tdap/Td (2 - Tdap) 08/14/2015    MDeboraha Sprangdoes not need a referral for Community Assistance: Care Management:   no Social Work:    no Prescription Assistance:  no Nutrition/Diabetes Education:  no   Plan:  Personalized Goals  Goals Addressed               This Visit's Progress     Patient Stated (pt-stated)        Patient stated that she would like to do more exercise and meditation.       Personalized Health Maintenance & Screening Recommendations  Pneumococcal vaccine  Influenza vaccine Td vaccine Shingles vaccine Mammogram Bone density scan  Patient declined the vaccines at this time.  Lung Cancer Screening Recommended: no (Low Dose CT Chest recommended if Age 70-80years, 30 pack-year currently smoking OR have quit w/in past 15 years) Hepatitis C Screening recommended: no HIV Screening recommended: no  Advanced Directives: Written information was not prepared per patient's request.  Referrals & Orders Orders Placed This Encounter  Procedures   DEXAScan   Mammogram 3D SCREEN BREAST BILATERAL    Follow-up Plan Follow-up with BDonella Stade PA-C as planned Medicare wellness visit in one year. Patient will access AVS on my chart   I have personally reviewed and noted the following in the patient's chart:   Medical and social history Use of alcohol, tobacco or illicit drugs  Current medications and supplements Functional ability and status Nutritional status Physical  activity Advanced directives List of other physicians Hospitalizations, surgeries, and ER visits in previous 12 months Vitals Screenings to include cognitive, depression, and falls Referrals and appointments  In addition, I have reviewed and discussed with MDeboraha Sprangcertain preventive protocols, quality metrics, and best practice recommendations. A written personalized care plan for preventive services as well as general preventive health recommendations is available and can be mailed to the patient at her request.      BTinnie Gens RN BSN  10/16/2022

## 2022-10-17 ENCOUNTER — Other Ambulatory Visit (HOSPITAL_BASED_OUTPATIENT_CLINIC_OR_DEPARTMENT_OTHER): Payer: Self-pay

## 2022-10-17 ENCOUNTER — Telehealth: Payer: Self-pay | Admitting: Neurology

## 2022-10-17 DIAGNOSIS — K5904 Chronic idiopathic constipation: Secondary | ICD-10-CM

## 2022-10-17 MED ORDER — PANTOPRAZOLE SODIUM 40 MG PO TBEC
40.0000 mg | DELAYED_RELEASE_TABLET | Freq: Two times a day (BID) | ORAL | 1 refills | Status: AC
  Filled 2022-10-17: qty 180, 90d supply, fill #0

## 2022-10-17 MED ORDER — LINACLOTIDE 72 MCG PO CAPS
72.0000 ug | ORAL_CAPSULE | Freq: Every day | ORAL | 0 refills | Status: DC
  Filled 2022-10-17: qty 90, 90d supply, fill #0

## 2022-10-17 NOTE — Telephone Encounter (Signed)
One RX already sent, sent others today.

## 2022-10-17 NOTE — Telephone Encounter (Signed)
-----   Message from [name], PA-C sent at 10/17/2022  2:32 PM EST ----- Regarding: FW: refill Can we check on these refills? thanks ----- Message ----- From: Tinnie Gens, RN Sent: 10/16/2022  10:45 AM EST To: [name], PA-C; Annamaria Helling, CMA Subject: refill                                         Patient requested refills for Linzess, Pantoprazole and Pepcid when she was completing her medicare wellness. She stated she already called the pharmacy.  Thanks

## 2022-10-19 ENCOUNTER — Encounter: Payer: Self-pay | Admitting: Emergency Medicine

## 2022-10-19 ENCOUNTER — Telehealth: Payer: Self-pay

## 2022-10-19 ENCOUNTER — Ambulatory Visit
Admission: EM | Admit: 2022-10-19 | Discharge: 2022-10-19 | Disposition: A | Discharge status: Home / Self Care | Payer: 59 | Attending: Urgent Care | Admitting: Urgent Care

## 2022-10-19 DIAGNOSIS — K1370 Unspecified lesions of oral mucosa: Secondary | ICD-10-CM

## 2022-10-19 MED ORDER — DEXAMETHASONE 0.5 MG/5ML PO ELIX: ORAL_SOLUTION | ORAL | 0 refills | Status: DC

## 2022-10-19 NOTE — ED Triage Notes (Signed)
Patient c/o possible lesions on her tongue she noticed 3 days ago.  The areas are red and some difficulty swallowing.  Patient denies any OTC pain meds.

## 2022-10-19 NOTE — Discharge Instructions (Signed)
I will address your symptoms for an inflammatory process. Please start using the dexamethasone elixir, use 1 week's worth. Follow up with an oral surgeon to address your concerns further including consideration for a biopsy.

## 2022-10-19 NOTE — Telephone Encounter (Signed)
Patient left a vm msg at 832 am requesting an appointment with provider. CMA's vm was not checked until later in the afternoon due to coverage. No call made to Triage for immediate attention. Patient had concerns regarding a new lesion and pain on her tongue. Per chart notes, patient went to UC to be evaluated.

## 2022-10-19 NOTE — ED Provider Notes (Signed)
[place]  Note:  This document was prepared using Dragon voice recognition software and may include unintentional dictation errors.  MRN: [id number] DOB: 1950-08-18  Subjective:   Jamie [name] is a 72 y.o. female presenting for 3-day history of acute onset persistent tongue pain over the back of her tongue with lesions that she just noticed.  Has significant concern about these new lesions which she says have never been there before.  Has not tried any medications.   No current facility-administered medications for this encounter.  Current Outpatient Medications:    albuterol (PROVENTIL HFA) 108 (90 Base) MCG/ACT inhaler, Inhale 2 puffs into the lungs every 6 (six) hours as needed for wheezing or shortness of breath. (Patient not taking: Reported on 08/09/2022), Disp: 18 g, Rfl: 0   ALPRAZolam (XANAX) 0.5 MG tablet, TAKE 1 TABLET(0.5 MG) BY MOUTH TWICE DAILY AS NEEDED FOR SLEEP, Disp: 60 tablet, Rfl: 1   ASPIRIN LOW DOSE 81 MG tablet, Take 81 mg by mouth daily., Disp: , Rfl:    atorvastatin (LIPITOR) 20 MG tablet, Take 20 mg by mouth daily. (Patient not taking: Reported on 10/16/2022), Disp: , Rfl:    b complex vitamins capsule, Take 1 capsule by mouth daily., Disp: , Rfl:    Blood Glucose Monitoring Suppl (ACCU-CHEK GUIDE ME) w/Device KIT, Dx R73.09 Elevated glucose - Check blood sugar 4 times daily., Disp: 1 kit, Rfl: prn   cholecalciferol (VITAMIN D3) 25 MCG (1000 UNIT) tablet, Take 1,000 Units by mouth daily., Disp: , Rfl:    clobetasol cream (TEMOVATE) [id number] %, Apply 1 Application topically 2 (two) times daily., Disp: , Rfl:    conjugated estrogens (PREMARIN) vaginal cream, APPLY VAGINALLY 3 TIMES A WEEK AT NIGHT, Disp: 60 g, Rfl: 1   diclofenac (VOLTAREN) 75 MG EC tablet, TAKE 1 TABLET(75 MG) BY MOUTH TWICE DAILY (Patient not taking: Reported on 10/16/2022), Disp: 60 tablet, Rfl: 1   EPINEPHrine 0.3 mg/0.3 mL IJ SOAJ injection, Inject 0.3 mg into the muscle as needed  for anaphylaxis., Disp: 1 each, Rfl: 0   estradiol (ESTRACE) 0.1 MG/GM vaginal cream, Place 1 Applicatorful vaginally daily., Disp: , Rfl:    gabapentin (NEURONTIN) 100 MG capsule, Take 1 capsule (100 mg total) by mouth 3 (three) times daily., Disp: 90 capsule, Rfl: 1   ibuprofen (ADVIL) 800 MG tablet, Take 1 tablet (800 mg total) by mouth every 8 (eight) hours as needed. (Patient not taking: Reported on 10/16/2022), Disp: 90 tablet, Rfl: 1   L-Methylfolate-B6-B12 (FOLTANX) 3-35-2 MG TABS, Take 1 tablet by mouth in the morning and at bedtime. (Patient not taking: Reported on 10/16/2022), Disp: 60 tablet, Rfl: 11   Lancets 30G MISC, Dx R73.09 Elevated glucose - Check blood sugar 4 times daily. (Patient not taking: Reported on 10/16/2022), Disp: 300 each, Rfl: prn   levothyroxine (SYNTHROID) 75 MCG tablet, TAKE 1 TABLET(75 MCG) BY MOUTH DAILY BEFORE BREAKFAST (Patient not taking: Reported on 10/16/2022), Disp: 90 tablet, Rfl: 1   lidocaine (LIDODERM) 5 %, Place 1 patch onto the skin daily. Remove & Discard patch within 12 hours or as directed by MD (Patient not taking: Reported on 10/16/2022), Disp: 30 patch, Rfl: 0   lidocaine (XYLOCAINE) 5 % ointment, Apply 1 application topically daily. (Patient not taking: Reported on 10/16/2022), Disp: 50 g, Rfl: 0   linaclotide (LINZESS) 72 MCG capsule, Take 1 capsule (72 mcg total) by mouth daily before breakfast., Disp: 90 capsule, Rfl: 0   meclizine (ANTIVERT) 25 MG  tablet, Take 1 tablet (25 mg total) by mouth 3 (three) times daily as needed for dizziness. (Patient not taking: Reported on 10/16/2022), Disp: 30 tablet, Rfl: 2   OVER THE COUNTER MEDICATION, Take 2 capsules by mouth daily. Friendly four, Disp: , Rfl:    pantoprazole (PROTONIX) 40 MG tablet, Take 1 tablet (40 mg total) by mouth in the morning and at bedtime. As needed, Disp: 180 tablet, Rfl: 1   PEPCID 20 MG tablet, TAKE 1 TABLET BY MOUTH TWICE DAILY, Disp: 180 tablet, Rfl: 0   promethazine (PHENERGAN) 25 MG  tablet, Take 1 tablet (25 mg total) by mouth every 6 (six) hours as needed for nausea or vomiting., Disp: 30 tablet, Rfl: 0   tobramycin-dexamethasone (TOBRADEX) ophthalmic solution, Place 1 drop into the right eye 4 (four) times daily., Disp: , Rfl:    Vitamin D, Ergocalciferol, (DRISDOL) 1.25 MG (50000 UNIT) CAPS capsule, Take by mouth., Disp: , Rfl:    Allergies  Allergen Reactions   Bactrim [Sulfamethoxazole-Trimethoprim] Anaphylaxis   Dairycare [Lactase-Lactobacillus] Anaphylaxis   Ciprofloxacin     Facial numbness   Iodine Rash   Iodinated Contrast Media Other (See Comments)    Face and chest felt "hot", throat felt tight.    Levaquin [Levofloxacin]     rash   Macrobid [Nitrofurantoin]     Increased BP/kidney function decline   Peanut Butter Flavor Nausea And Vomiting    N/V   Shellfish Allergy Swelling    Past Medical History:  Diagnosis Date   Abdominal bloating 08/23/2017   Abnormal finding on urinalysis 02/27/2022   Last Assessment & Plan:  Formatting of this note might be different from the original. 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   Antibiotic-resistant bacterial infection 11/20/2021   Anxiety    Anxiety about health 08/23/2017   ANXIETY DEPRESSION 06/09/2010   Qualifier: Diagnosis of  By: Inda Castle FNP, Melissa S    Asymptomatic bacteriuria 06/11/2022   Atypical chest pain 08/23/2017   B12 deficiency 06/13/2015   Bacteriuria  11/29/2021   Biceps tendonitis on right 04/13/2022   Bilateral headaches    Bilateral tinnitus 12/23/2020   Cardiovascular risk factor 05/08/2017   Carotid stenosis, asymptomatic, bilateral 05/08/2017   Carotid dopplers showed bilateral ICA stenosis 1-39 percent 2016 Minor carotid intimal thickening without significant atherosclerosis. No hemodynamically significant ICA stenosis. Degree of narrowing less than 50% bilaterally. 08/2015  1. Mild (1-49%) stenosis proximal right internal carotid artery secondary to heterogenous atherosclerotic plaque. 2. Mild (1-49%) stenosis proximal left internal car   Carpal tunnel syndrome 10/17/2015   Cataract    bilateral   Cervical radiculopathy 10/01/2018   Xray 09/2018: cervical DDD, disc height loss at C5 and C6.    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   Constipation 07/19/2015   DDD (degenerative disc disease), cervical 08/31/2015   Depression with anxiety 06/18/2015   Diverticulitis of  large intestine with abscess without bleeding    Diverticulosis    Diverticulosis of large intestine 07/03/2011   Dyspnea    Elevated random blood glucose level 11/29/2021   Epigastric pain 07/06/2019   ETD (Eustachian tube dysfunction), right 06/11/2018   Exposure to chemical inhalation 11/06/2016   Exposure to hepatitis C 06/21/2021   Exposure to TB 06/21/2021   Extrinsic asthma, unspecified    Family history of renal cancer 07/20/2015   Fatigue 01/15/2018   Former smoker 05/14/2016   GAD (generalized anxiety disorder) 10/12/2019   Gastritis and duodenitis 04/15/2017   Generalized abdominal pain 01/24/2014   Genitourinary syndrome of menopause 02/27/2022   Formatting of this note might be different from the original. On premarin cream  Last Assessment & Plan:  Formatting of this note might be different from the original. - continue premarin cream 2-3x per week   GERD (gastroesophageal reflux disease)    Hair loss  07/20/2015   Heel pain, bilateral 01/15/2018   Hematuria 01/25/2017   Herpes zoster without complication 0000000   History of excessive cerumen 05/08/2017   History of shingles 07/19/2015   Hives of unknown origin 10/12/2019   Hx of diverticulitis of colon 01/25/2017   Hyperlipidemia    Hyperpigmentation of skin 07/19/2015   Hypopigmentation 01/11/2016   IBS (irritable bowel syndrome)    Impacted cerumen, bilateral 02/09/2020   Iron deficiency anemia secondary to inadequate dietary iron intake 08/23/2017   Left arm numbness 10/13/2015   Left ear impacted cerumen 12/23/2020   Leg cramping 12/23/2020   Low iron stores 07/19/2015   Lung mass    Mid back pain on left side 07/20/2015   Migraine with aura and with status migrainosus, not intractable    Moderate protein-calorie malnutrition ([place]) 10/07/2019   Multiple food allergies 01/11/2016   Muscle strain of upper back 01/15/2018   Nausea 01/29/2012   Neck pain 10/13/2015   Neuropathy 02/02/2020   Normocytic anemia 06/18/2015   Numbness and tingling of right side of face 12/31/2018   OAB (overactive bladder) 09/21/2019   ONYCHOMYCOSIS, TOENAILS 06/09/2010   Qualifier: Diagnosis of  By: Inda Castle FNP, Melissa S    Osteopenia 01/20/2014   Osteoporosis 12/14/2020   -2.8. discussed starting medication.    Pelvic floor tension 02/27/2022   Formatting of this note might be different from the original. 02/27/2022 very tender on exam, particularly on the right  Last Assessment & Plan:  Formatting of this note might be different from the original. We discussed the pathophysiology of levator spasm, which can be triggered by any painful or traumatic pelvic episode, such as a severe UTI, pelvic surgery, sexual abuse, or chronic conditions.    Pelvic floor weakness 09/16/2017   Pernicious anemia 10/13/2015   Pneumaturia 02/27/2022   Pneumonia    Post herpetic neuralgia 06/21/2021   Pre-diabetes    Rectocele 02/27/2022   Formatting of this note might be different from the  original. 02/27/2022  Describes a bulge or pocket in her vagina, has to splint for BM. Worsened by bearing down.    Last Assessment & Plan:  Formatting of this note might be different from the original. No evidence of prolapse/rectocele on exam today. CTM, meanwhile advised pt to avoid splinting, optimize diet, and f/u with pelvic floor PT   Recurrent infections 09/25/2019   Recurrent urinary tract infection 01/29/2012   Right knee pain 11/05/2016   Right-sided headache    S/P laparoscopic-assisted sigmoidectomy 09/08/2021   Seasonal allergic rhinitis due to  pollen 09/25/2019   Seasonal and perennial allergic rhinitis 11/06/2011   Allergy skin test 02/13/2012: Mild to moderate reactions mainly for grass, weed, tree pollens, house dust/mite Allergy profile 12/20/2011: Total IgE 33.5. No specific elevations including shrimp.   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 treatment plan  repeat scan in one year and then if stable at that point we'll repeat every 2-3 years for monitoring.   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   Thyroid disease    Unintentional weight loss 07/22/2020   Urinary frequency 06/13/2018   Vaginal dryness 01/11/2016   Vaginal irritation 09/15/2018   Viral respiratory infection 12/04/2019   Vision changes 01/25/2017   Vitamin D deficiency 08/23/2017     Past Surgical History:  Procedure Laterality Date   ABDOMINAL HYSTERECTOMY     COLONOSCOPY     CYSTOSCOPY WITH STENT PLACEMENT Bilateral 09/08/2021   Procedure: CYSTOSCOPY WITH STENT PLACEMENT;  Surgeon: Irine Seal, MD;  Location: [facility];  Service: Urology;  Laterality: Bilateral;   FLEXIBLE SIGMOIDOSCOPY N/A 09/08/2021   Procedure: FLEXIBLE SIGMOIDOSCOPY;  Surgeon: Ileana Roup, MD;  Location: [facility];  Service: General;  Laterality: N/A;    HEMORRHOID SURGERY     LYSIS OF ADHESION N/A 09/08/2021   Procedure: LYSIS OF ADHESION;  Surgeon: Ileana Roup, MD;  Location: [facility];  Service: General;  Laterality: N/A;   TUBAL LIGATION     TUMOR REMOVAL     Left thigh     Family History  Problem Relation Age of Onset   Diabetes Mother    Liver cancer Mother        renal cell    Heart disease Father        first MI in 15s   Cervical cancer Sister    Allergic rhinitis Sister    Asthma Sister    Heart disease Brother        first MI in 60s   Alcohol abuse Sister    Fibromyalgia Sister    Pancreatic cancer Sister 50   Arthritis Sister        osteoarthritis   Emphysema Sister    Irritable bowel syndrome Sister    Emphysema Sister    Leukemia Other        grandson   Urticaria Daughter    Allergic rhinitis Daughter    Stomach cancer Neg Hx    Colon cancer Neg Hx    Angioedema Neg Hx    Eczema Neg Hx    Immunodeficiency Neg Hx    Colon polyps Neg Hx    Esophageal cancer Neg Hx    Rectal cancer Neg Hx     Social History   Tobacco Use   Smoking status: Former    Packs/day: 0.80    Years: 20.00    Total pack years: 16.00    Types: Cigarettes    Quit date: 08/13/1985    Years since quitting: 37.2   Smokeless tobacco: Never  Vaping Use   Vaping Use: Never used  Substance Use Topics   Alcohol use: Yes    Comment: rare   Drug use: No    ROS   Objective:   Vitals: BP 132/70 (BP Location: Right Arm)   Pulse 86   Temp 97.9 F (36.6 C) (Oral)   Resp 16  Wt 140 lb (63.5 kg)   SpO2 100%   BMI 23.66 kg/m   Physical Exam Constitutional:      General: She is not in acute distress.    Appearance: Normal appearance. She is well-developed. She is not ill-appearing, toxic-appearing or diaphoretic.  HENT:     Head: Normocephalic and atraumatic.     Nose: Nose normal.     Mouth/Throat:     Mouth: Mucous membranes are moist.     Tongue: No lesions. Tongue does not deviate from midline.     Pharynx: No  pharyngeal swelling, oropharyngeal exudate, posterior oropharyngeal erythema or uvula swelling.     Tonsils: No tonsillar exudate or tonsillar abscesses. 0 on the right. 0 on the left.   Eyes:     General: No scleral icterus.       Right eye: No discharge.        Left eye: No discharge.     Extraocular Movements: Extraocular movements intact.  Cardiovascular:     Rate and Rhythm: Normal rate.  Pulmonary:     Effort: Pulmonary effort is normal.  Skin:    General: Skin is warm and dry.  Neurological:     General: No focal deficit present.     Mental Status: She is alert and oriented to person, place, and time.  Psychiatric:        Mood and Affect: Mood normal.        Behavior: Behavior normal.     Assessment and Plan :   PDMP not reviewed this encounter.  1. Oral lesion     Patient has significant concern about the spots over the back of the tongue.  I suspect it is related to using a new toothpaste but patient is still concerned about a possible insidious process.  Recommended a consultation with an oral surgeon for consideration of biopsy.  In the meantime, offered patient a dexamethasone elixir to address an inflammatory process.  Follow-up with her PCP otherwise.   Jaynee Eagles, PA-C 10/19/22 [id number]

## 2022-10-20 ENCOUNTER — Other Ambulatory Visit: Payer: Self-pay | Admitting: Physician Assistant

## 2022-10-20 ENCOUNTER — Encounter: Payer: Self-pay | Admitting: Physician Assistant

## 2022-10-20 DIAGNOSIS — F419 Anxiety disorder, unspecified: Secondary | ICD-10-CM

## 2022-10-22 ENCOUNTER — Encounter: Payer: Self-pay | Admitting: Physician Assistant

## 2022-10-22 NOTE — Telephone Encounter (Signed)
Last fill 08/20/22 with 1 refill

## 2022-10-22 NOTE — Telephone Encounter (Signed)
Last written 08/20/2022 #60 with 1 refills Last appt 08/08/2022

## 2022-10-22 NOTE — Telephone Encounter (Signed)
Sent refills

## 2022-10-23 NOTE — Progress Notes (Unsigned)
New Patient Note  RE: Jamie [name] MRN: [id number] DOB: 06-24-1951 Date of Office Visit: 10/24/2022  Consult requested by: [name], PA-C Primary care provider: [name], PA-C  Chief Complaint: No chief complaint on file.  History of Present Illness: I had the pleasure of seeing Jamie [name] for initial evaluation at the [facility] on 10/23/2022. She is a 72 y.o. female, who is referred here by [name], PA-C for the evaluation of ***.  Patient was last seen in our office by Dr. 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 - Testing was negative to the entire panel (results provided). - You *should* be able to introduce these foods back into your diet. - Continue with your anxiety medications.  - Prednisone pack provided to use in case of reactions.  ***  Assessment and Plan: Jamie [name] is a 72 y.o. female with: No problem-specific Assessment & Plan notes found for this encounter.  No follow-ups on file.  No orders of the defined types were placed in this encounter.  Lab Orders  No laboratory test(s) ordered today    Other allergy screening: Asthma: {Blank single:19197::"yes","no"} Rhino conjunctivitis: {Blank single:19197::"yes","no"} Food allergy: {Blank single:19197::"yes","no"} Medication allergy: {Blank single:19197::"yes","no"} Hymenoptera allergy: {Blank single:19197::"yes","no"} Urticaria: {Blank single:19197::"yes","no"} Eczema:{Blank single:19197::"yes","no"} History of recurrent infections suggestive of immunodeficency: {Blank single:19197::"yes","no"}  Diagnostics: Spirometry:  Tracings reviewed. Her effort: {Blank single:19197::"Good reproducible efforts.","It was hard to get consistent efforts and there is a question as to whether this reflects a maximal maneuver.","Poor effort, data can not be interpreted."} FVC: ***L FEV1: ***L,  ***% predicted FEV1/FVC ratio: ***% Interpretation: {Blank single:19197::"Spirometry consistent with mild obstructive disease","Spirometry consistent with moderate obstructive disease","Spirometry consistent with severe obstructive disease","Spirometry consistent with possible restrictive disease","Spirometry consistent with mixed obstructive and restrictive disease","Spirometry uninterpretable due to technique","Spirometry consistent with normal pattern","No overt abnormalities noted given today's efforts"}.  Please see scanned spirometry results for details.  Skin Testing: {Blank single:19197::"Select foods","Environmental allergy panel","Environmental allergy panel and select foods","Food allergy panel","None","Deferred due to recent antihistamines use"}. *** Results discussed with patient/family.   Past Medical History: Patient Active Problem List   Diagnosis Date Noted   DDD (degenerative disc disease), lumbar 08/14/2022   Adenopathy, cervical 08/08/2022   Acute bilateral low back pain without sciatica 08/08/2022   Aortic atherosclerosis ([place]) 07/13/2022   Memory changes 06/26/2022   SOB (shortness of breath) on exertion 06/20/2022   Asymptomatic bacteriuria 06/11/2022   Allergy 05/17/2022   Anxiety 05/17/2022   Cataract 05/17/2022   Diverticulosis 05/17/2022   Dyspnea 05/17/2022   GERD (gastroesophageal reflux disease) 05/17/2022   IBS (irritable bowel syndrome) 05/17/2022   Pneumonia 05/17/2022   Pre-diabetes 05/17/2022   Acute pain of right shoulder 04/13/2022   Biceps tendonitis on right 04/13/2022   Abnormal finding on urinalysis 02/27/2022   Genitourinary syndrome of menopause 02/27/2022   Pelvic floor tension 02/27/2022   Pneumaturia 02/27/2022   Rectocele 02/27/2022   Acquired hypothyroidism 02/07/2022   Bacteriuria 11/29/2021   Elevated random blood glucose level 11/29/2021   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   Antibiotic-resistant bacterial infection 11/20/2021    S/P laparoscopic-assisted sigmoidectomy 09/08/2021   Exposure to TB 06/21/2021   Exposure to hepatitis C 06/21/2021   Post herpetic neuralgia 06/21/2021   Herpes zoster without complication 0000000   Bilateral tinnitus 12/23/2020   Leg cramping 12/23/2020   Left ear impacted cerumen 12/23/2020   Acute non-recurrent maxillary sinusitis 12/23/2020  Osteoporosis 12/14/2020   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   Unintentional weight loss 07/22/2020   Impacted cerumen, bilateral 02/09/2020   Neuropathy 02/02/2020   Viral respiratory infection 12/04/2019   GAD (generalized anxiety disorder) 10/12/2019   Hives of unknown origin 10/12/2019   Anaphylactic syndrome 10/12/2019   Moderate protein-calorie malnutrition (HCC) 10/07/2019   Allergic reaction 09/25/2019   Recurrent infections 09/25/2019   Seasonal allergic rhinitis due to pollen 09/25/2019   OAB (overactive bladder) 09/21/2019   Stress due to family tension 07/15/2019   Epigastric pain 07/06/2019   Tachycardia 01/02/2019   Numbness and tingling of right side of face 12/31/2018   Right-sided headache 12/31/2018   Cervical radiculopathy 10/01/2018   Vaginal irritation 09/15/2018   Anal itching 09/15/2018   Urinary frequency 06/13/2018   ETD (Eustachian tube dysfunction), right 06/11/2018   Fatigue 01/15/2018   Heel pain, bilateral 01/15/2018   Muscle strain of upper back 01/15/2018   Acute bilateral low back pain with bilateral sciatica 10/07/2017   Pelvic floor weakness 09/16/2017   Bilateral headaches 09/16/2017   Adverse food reaction 09/02/2017   Vitamin D deficiency 08/23/2017   Iron deficiency anemia secondary to inadequate dietary iron intake 08/23/2017   Abnormal urine odor 08/23/2017   Atypical chest pain 08/23/2017   Abdominal bloating 08/23/2017   Anxiety about health 08/23/2017   Carotid stenosis, asymptomatic, bilateral 05/08/2017   History of excessive cerumen 05/08/2017   Cardiovascular risk factor 05/08/2017    Gastritis and duodenitis 04/15/2017   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   Vision changes 01/25/2017   Hematuria 01/25/2017   Hx of diverticulitis of colon 01/25/2017   Acute left lower quadrant pain 01/25/2017   Exposure to chemical inhalation 11/06/2016   Right knee pain 11/05/2016   Former smoker 05/14/2016   Hypopigmentation 01/11/2016   Anaphylactic reaction due to food 01/11/2016   Vaginal dryness 01/11/2016   Multiple food allergies 01/11/2016   Adhesive capsulitis of shoulder 10/17/2015   Carpal tunnel syndrome 10/17/2015   Left arm numbness 10/13/2015   Neck pain 10/13/2015   Pernicious anemia 10/13/2015   Chronically dry eyes 10/13/2015   DDD (degenerative disc disease), cervical 08/31/2015   Mid back pain on left side 07/20/2015   Hair loss 07/20/2015   Family history of renal cancer 07/20/2015   History of shingles 07/19/2015   Low iron stores 07/19/2015   Subclinical hypothyroidism 07/19/2015   Constipation 07/19/2015   Hyperpigmentation of skin 07/19/2015   Depression with anxiety 06/18/2015   Normocytic anemia 06/18/2015   Diverticulitis of large intestine with abscess without bleeding    B12 deficiency 06/13/2015   Migraine with aura and with status migrainosus, not intractable 04/20/2015   Lung nodule 04/20/2015   Thyroid disease 03/08/2015   Generalized abdominal pain 01/24/2014   Osteopenia 01/20/2014   Change in consistency of stool 01/01/2014   Nausea 01/29/2012   Recurrent urinary tract infection 01/29/2012   Seasonal and perennial allergic rhinitis 11/06/2011   Diverticulosis of large intestine 07/03/2011   Hyperlipidemia 12/13/2010   ONYCHOMYCOSIS, TOENAILS 06/09/2010   ANXIETY DEPRESSION 06/09/2010   Past Medical History:  Diagnosis Date   Abdominal bloating 08/23/2017   Abnormal finding on urinalysis 02/27/2022   Last Assessment & Plan:  Formatting of this note might be different from the original.  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   Antibiotic-resistant bacterial infection 11/20/2021   Anxiety    Anxiety about health 08/23/2017   ANXIETY DEPRESSION 06/09/2010   Qualifier: Diagnosis of  By: Inda Castle FNP, Melissa S    Asymptomatic bacteriuria 06/11/2022   Atypical chest pain 08/23/2017   B12 deficiency 06/13/2015   Bacteriuria 11/29/2021   Biceps tendonitis on right 04/13/2022   Bilateral headaches    Bilateral tinnitus 12/23/2020   Cardiovascular risk factor 05/08/2017   Carotid stenosis, asymptomatic, bilateral 05/08/2017   Carotid dopplers showed bilateral ICA stenosis 1-39 percent 2016 Minor carotid intimal thickening without significant atherosclerosis. No hemodynamically significant ICA stenosis. Degree of narrowing less than 50% bilaterally. 08/2015  1. Mild (1-49%) stenosis proximal right internal carotid artery secondary to heterogenous atherosclerotic plaque. 2. Mild (1-49%) stenosis proximal left internal car   Carpal tunnel syndrome 10/17/2015   Cataract    bilateral   Cervical radiculopathy 10/01/2018   Xray 09/2018: cervical DDD, disc height loss at C5 and C6.    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   Constipation 07/19/2015   DDD (degenerative disc disease), cervical 08/31/2015   Depression with anxiety 06/18/2015   Diverticulitis of large intestine with abscess without  bleeding    Diverticulosis    Diverticulosis of large intestine 07/03/2011   Dyspnea    Elevated random blood glucose level 11/29/2021   Epigastric pain 07/06/2019   ETD (Eustachian tube dysfunction), right 06/11/2018   Exposure to chemical inhalation 11/06/2016   Exposure to hepatitis C 06/21/2021   Exposure to TB 06/21/2021   Extrinsic asthma, unspecified    Family history of renal cancer 07/20/2015   Fatigue 01/15/2018   Former smoker 05/14/2016   GAD (generalized anxiety disorder) 10/12/2019   Gastritis and duodenitis 04/15/2017   Generalized abdominal pain 01/24/2014   Genitourinary syndrome of menopause 02/27/2022   Formatting of this note might be different from the original. On premarin cream  Last Assessment & Plan:  Formatting of this note might be different from the original. - continue premarin cream 2-3x per week   GERD (gastroesophageal reflux disease)    Hair loss 07/20/2015   Heel pain, bilateral 01/15/2018   Hematuria 01/25/2017   Herpes zoster without complication 0000000   History of excessive cerumen 05/08/2017   History of shingles 07/19/2015   Hives of unknown origin 10/12/2019   Hx of diverticulitis of colon 01/25/2017   Hyperlipidemia    Hyperpigmentation of skin 07/19/2015   Hypopigmentation 01/11/2016   IBS (irritable bowel syndrome)    Impacted cerumen, bilateral 02/09/2020   Iron deficiency anemia secondary to inadequate dietary iron intake 08/23/2017   Left arm numbness 10/13/2015   Left ear impacted cerumen 12/23/2020   Leg cramping 12/23/2020   Low iron stores 07/19/2015   Lung mass    Mid back pain on left side 07/20/2015   Migraine with aura and with status migrainosus, not intractable    Moderate protein-calorie malnutrition ([place]) 10/07/2019   Multiple food allergies 01/11/2016   Muscle strain of upper back 01/15/2018  Nausea 01/29/2012   Neck pain 10/13/2015   Neuropathy 02/02/2020   Normocytic anemia 06/18/2015   Numbness and tingling of right side of face 12/31/2018    OAB (overactive bladder) 09/21/2019   ONYCHOMYCOSIS, TOENAILS 06/09/2010   Qualifier: Diagnosis of  By: Inda Castle FNP, Melissa S    Osteopenia 01/20/2014   Osteoporosis 12/14/2020   -2.8. discussed starting medication.    Pelvic floor tension 02/27/2022   Formatting of this note might be different from the original. 02/27/2022 very tender on exam, particularly on the right  Last Assessment & Plan:  Formatting of this note might be different from the original. We discussed the pathophysiology of levator spasm, which can be triggered by any painful or traumatic pelvic episode, such as a severe UTI, pelvic surgery, sexual abuse, or chronic conditions.    Pelvic floor weakness 09/16/2017   Pernicious anemia 10/13/2015   Pneumaturia 02/27/2022   Pneumonia    Post herpetic neuralgia 06/21/2021   Pre-diabetes    Rectocele 02/27/2022   Formatting of this note might be different from the original. 02/27/2022  Describes a bulge or pocket in her vagina, has to splint for BM. Worsened by bearing down.    Last Assessment & Plan:  Formatting of this note might be different from the original. No evidence of prolapse/rectocele on exam today. CTM, meanwhile advised pt to avoid splinting, optimize diet, and f/u with pelvic floor PT   Recurrent infections 09/25/2019   Recurrent urinary tract infection 01/29/2012   Right knee pain 11/05/2016   Right-sided headache    S/P laparoscopic-assisted sigmoidectomy 09/08/2021   Seasonal allergic rhinitis due to pollen 09/25/2019   Seasonal and perennial allergic rhinitis 11/06/2011   Allergy skin test 02/13/2012: Mild to moderate reactions mainly for grass, weed, tree pollens, house dust/mite Allergy profile 12/20/2011: Total IgE 33.5. No specific elevations including shrimp.   Splenic artery aneurysm ([place]) 03/22/2017   CT scan confirmed that the aneurysm  8 mm in the there are no additional aneurysms found. treatment plan  repeat scan in one year and then if stable at that point we'll  repeat every 2-3 years for monitoring.   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   Thyroid disease    Unintentional weight loss 07/22/2020   Urinary frequency 06/13/2018   Vaginal dryness 01/11/2016   Vaginal irritation 09/15/2018   Viral respiratory infection 12/04/2019   Vision changes 01/25/2017   Vitamin D deficiency 08/23/2017   Past Surgical History: Past Surgical History:  Procedure Laterality Date   ABDOMINAL HYSTERECTOMY     COLONOSCOPY     CYSTOSCOPY WITH STENT PLACEMENT Bilateral 09/08/2021   Procedure: CYSTOSCOPY WITH STENT PLACEMENT;  Surgeon: Irine Seal, MD;  Location: [facility];  Service: Urology;  Laterality: Bilateral;   FLEXIBLE SIGMOIDOSCOPY N/A 09/08/2021   Procedure: FLEXIBLE SIGMOIDOSCOPY;  Surgeon: Ileana Roup, MD;  Location: [facility];  Service: General;  Laterality: N/A;   HEMORRHOID SURGERY     LYSIS OF ADHESION N/A 09/08/2021   Procedure: LYSIS OF ADHESION;  Surgeon: Ileana Roup, MD;  Location: [facility];  Service: General;  Laterality: N/A;   TUBAL LIGATION     TUMOR REMOVAL     Left thigh    Medication List:  Current Outpatient Medications  Medication Sig Dispense Refill   albuterol (PROVENTIL HFA) 108 (90 Base) MCG/ACT inhaler Inhale 2 puffs into the lungs every 6 (six) hours as needed  for wheezing or shortness of breath. (Patient not taking: Reported on 08/09/2022) 18 g 0   ALPRAZolam (XANAX) 0.5 MG tablet TAKE 1 TABLET(0.5 MG) BY MOUTH TWICE DAILY AS NEEDED FOR SLEEP 60 tablet 1   ASPIRIN LOW DOSE 81 MG tablet Take 81 mg by mouth daily.     atorvastatin (LIPITOR) 20 MG tablet Take 20 mg by mouth daily. (Patient not taking: Reported on 10/16/2022)     b complex vitamins capsule Take 1 capsule by mouth daily.     Blood Glucose Monitoring Suppl (ACCU-CHEK GUIDE ME) w/Device KIT Dx R73.09 Elevated glucose - Check blood sugar 4 times daily. 1 kit prn    cholecalciferol (VITAMIN D3) 25 MCG (1000 UNIT) tablet Take 1,000 Units by mouth daily.     clobetasol cream (TEMOVATE) [id number] % Apply 1 Application topically 2 (two) times daily.     conjugated estrogens (PREMARIN) vaginal cream APPLY VAGINALLY 3 TIMES A WEEK AT NIGHT 60 g 1   dexamethasone 0.5 MG/5ML elixir 5 mL swish and spit three to four times daily. It is important to keep the medication in the mouth for five minutes prior to spitting it out. Do not rinse afterward and avoid eating or drinking for 30 minutes. 100 mL 0   diclofenac (VOLTAREN) 75 MG EC tablet TAKE 1 TABLET(75 MG) BY MOUTH TWICE DAILY (Patient not taking: Reported on 10/16/2022) 60 tablet 1   EPINEPHrine 0.3 mg/0.3 mL IJ SOAJ injection Inject 0.3 mg into the muscle as needed for anaphylaxis. 1 each 0   estradiol (ESTRACE) 0.1 MG/GM vaginal cream Place 1 Applicatorful vaginally daily.     gabapentin (NEURONTIN) 100 MG capsule Take 1 capsule (100 mg total) by mouth 3 (three) times daily. 90 capsule 1   ibuprofen (ADVIL) 800 MG tablet Take 1 tablet (800 mg total) by mouth every 8 (eight) hours as needed. (Patient not taking: Reported on 10/16/2022) 90 tablet 1   L-Methylfolate-B6-B12 (FOLTANX) 3-35-2 MG TABS Take 1 tablet by mouth in the morning and at bedtime. (Patient not taking: Reported on 10/16/2022) 60 tablet 11   Lancets 30G MISC Dx R73.09 Elevated glucose - Check blood sugar 4 times daily. (Patient not taking: Reported on 10/16/2022) 300 each prn   levothyroxine (SYNTHROID) 75 MCG tablet TAKE 1 TABLET(75 MCG) BY MOUTH DAILY BEFORE BREAKFAST (Patient not taking: Reported on 10/16/2022) 90 tablet 1   lidocaine (LIDODERM) 5 % Place 1 patch onto the skin daily. Remove & Discard patch within 12 hours or as directed by MD (Patient not taking: Reported on 10/16/2022) 30 patch 0   lidocaine (XYLOCAINE) 5 % ointment Apply 1 application topically daily. (Patient not taking: Reported on 10/16/2022) 50 g 0   linaclotide (LINZESS) 72 MCG capsule Take 1  capsule (72 mcg total) by mouth daily before breakfast. 90 capsule 0   meclizine (ANTIVERT) 25 MG tablet Take 1 tablet (25 mg total) by mouth 3 (three) times daily as needed for dizziness. (Patient not taking: Reported on 10/16/2022) 30 tablet 2   OVER THE COUNTER MEDICATION Take 2 capsules by mouth daily. [place] four     pantoprazole (PROTONIX) 40 MG tablet Take 1 tablet (40 mg total) by mouth in the morning and at bedtime. As needed 180 tablet 1   PEPCID 20 MG tablet TAKE 1 TABLET BY MOUTH TWICE DAILY 180 tablet 0   promethazine (PHENERGAN) 25 MG tablet Take 1 tablet (25 mg total) by mouth every 6 (six) hours as needed for nausea or vomiting. [place]  tablet 0   tobramycin-dexamethasone (TOBRADEX) ophthalmic solution Place 1 drop into the right eye 4 (four) times daily.     Vitamin D, Ergocalciferol, (DRISDOL) 1.25 MG (50000 UNIT) CAPS capsule Take by mouth.     No current facility-administered medications for this visit.   Allergies: Allergies  Allergen Reactions   Bactrim [Sulfamethoxazole-Trimethoprim] Anaphylaxis   Dairycare [Lactase-Lactobacillus] Anaphylaxis   Ciprofloxacin     Facial numbness   Iodine Rash   Iodinated Contrast Media Other (See Comments)    Face and chest felt "hot", throat felt tight.    Levaquin [Levofloxacin]     rash   Macrobid [Nitrofurantoin]     Increased BP/kidney function decline   Peanut Butter Flavor Nausea And Vomiting    N/V   Shellfish Allergy Swelling   Social History: Social History   Socioeconomic History   Marital status: Single    Spouse name: Not on file   Number of children: 3   Years of education: 14   Highest education level: Associate degree: academic program  Occupational History   Occupation: Ship broker   Occupation: retired.  Tobacco Use   Smoking status: Former    Packs/day: 0.80    Years: 20.00    Total pack years: 16.00    Types: Cigarettes    Quit date: 08/13/1985    Years since quitting: 37.2   Smokeless tobacco: Never   Vaping Use   Vaping Use: Never used  Substance and Sexual Activity   Alcohol use: Yes    Comment: rare   Drug use: No   Sexual activity: Not Currently    Partners: Male    Birth control/protection: None  Other Topics Concern   Not on file  Social History Narrative   Her daughter is staying with her since her surgery. She enjoys shopping, exercise (when she can) and enjoys word finder.    Social Determinants of Health   Financial Resource Strain: Low Risk  (10/16/2022)   Overall Financial Resource Strain (CARDIA)    Difficulty of Paying Living Expenses: Not hard at all  Food Insecurity: No Food Insecurity (10/16/2022)   Hunger Vital Sign    Worried About Running Out of Food in the Last Year: Never true    Ran Out of Food in the Last Year: Never true  Transportation Needs: No Transportation Needs (10/16/2022)   PRAPARE - Hydrologist (Medical): No    Lack of Transportation (Non-Medical): No  Physical Activity: Insufficiently Active (10/16/2022)   Exercise Vital Sign    Days of Exercise per Week: 1 day    Minutes of Exercise per Session: 30 min  Stress: No Stress Concern Present (10/16/2022)   [place]    Feeling of Stress : Only a little  Social Connections: Unknown (10/16/2022)   Social Connection and Isolation Panel [NHANES]    Frequency of Communication with Friends and Family: More than three times a week    Frequency of Social Gatherings with Friends and Family: Never    Attends Religious Services: More than 4 times per year    Active Member of [employer] or Organizations: No    Attends Archivist Meetings: Never    Marital Status: Patient refused   Lives in a ***. Smoking: *** Occupation: ***  Environmental History: Water Damage/mildew in the house: Estate agent in the family room: {Blank single:19197::"yes","no"} Carpet in the bedroom: {Blank  single:19197::"yes","no"} Heating: {Blank single:19197::"electric","gas","heat pump"} Cooling: {  Blank single:19197::"central","window","heat pump"} Pet: {Blank single:19197::"yes ***","no"}  Family History: Family History  Problem Relation Age of Onset   Diabetes Mother    Liver cancer Mother        renal cell    Heart disease Father        first MI in 38s   Cervical cancer Sister    Allergic rhinitis Sister    Asthma Sister    Heart disease Brother        first MI in 76s   Alcohol abuse Sister    Fibromyalgia Sister    Pancreatic cancer Sister 32   Arthritis Sister        osteoarthritis   Emphysema Sister    Irritable bowel syndrome Sister    Emphysema Sister    Leukemia Other        grandson   Urticaria Daughter    Allergic rhinitis Daughter    Stomach cancer Neg Hx    Colon cancer Neg Hx    Angioedema Neg Hx    Eczema Neg Hx    Immunodeficiency Neg Hx    Colon polyps Neg Hx    Esophageal cancer Neg Hx    Rectal cancer Neg Hx    Problem                               Relation Asthma                                   *** Eczema                                *** Food allergy                          *** Allergic rhino conjunctivitis     ***  Review of Systems  Constitutional:  Negative for appetite change, chills, fever and unexpected weight change.  HENT:  Negative for congestion and rhinorrhea.   Eyes:  Negative for itching.  Respiratory:  Negative for cough, chest tightness, shortness of breath and wheezing.   Cardiovascular:  Negative for chest pain.  Gastrointestinal:  Negative for abdominal pain.  Genitourinary:  Negative for difficulty urinating.  Skin:  Negative for rash.  Neurological:  Negative for headaches.    Objective: There were no vitals taken for this visit. There is no height or weight on file to calculate BMI. Physical Exam Vitals and nursing note reviewed.  Constitutional:      Appearance: Normal appearance. She is well-developed.   HENT:     Head: Normocephalic and atraumatic.     Right Ear: Tympanic membrane and external ear normal.     Left Ear: Tympanic membrane and external ear normal.     Nose: Nose normal.     Mouth/Throat:     Mouth: Mucous membranes are moist.     Pharynx: Oropharynx is clear.  Eyes:     Conjunctiva/sclera: Conjunctivae normal.  Cardiovascular:     Rate and Rhythm: Normal rate and regular rhythm.     Heart sounds: Normal heart sounds. No murmur heard.    No friction rub. No gallop.  Pulmonary:     Effort: Pulmonary effort is normal.     Breath sounds: Normal breath sounds. No wheezing, rhonchi  or rales.  Musculoskeletal:     Cervical back: Neck supple.  Skin:    General: Skin is warm.     Findings: No rash.  Neurological:     Mental Status: She is alert and oriented to person, place, and time.  Psychiatric:        Behavior: Behavior normal.    The plan was reviewed with the patient/family, and all questions/concerned were addressed.  It was my pleasure to see Jamie [name] today and participate in her care. Please feel free to contact me with any questions or concerns.  Sincerely,  Rexene Alberts, DO Allergy & Immunology  [facility] office: [place] office: [phone]

## 2022-10-24 ENCOUNTER — Ambulatory Visit (INDEPENDENT_AMBULATORY_CARE_PROVIDER_SITE_OTHER): Payer: 59 | Admitting: Allergy

## 2022-10-24 ENCOUNTER — Encounter: Payer: Self-pay | Admitting: Allergy

## 2022-10-24 VITALS — BP 112/70 | HR 81 | Temp 98.6°F | Resp 16 | Ht 65.5 in | Wt 140.0 lb

## 2022-10-24 DIAGNOSIS — Z889 Allergy status to unspecified drugs, medicaments and biological substances status: Secondary | ICD-10-CM | POA: Diagnosis not present

## 2022-10-24 DIAGNOSIS — Z87892 Personal history of anaphylaxis: Secondary | ICD-10-CM

## 2022-10-24 DIAGNOSIS — T781XXD Other adverse food reactions, not elsewhere classified, subsequent encounter: Secondary | ICD-10-CM | POA: Diagnosis not present

## 2022-10-24 DIAGNOSIS — J310 Chronic rhinitis: Secondary | ICD-10-CM

## 2022-10-24 NOTE — Patient Instructions (Addendum)
Drug/supplements Continue to avoid medications that gave you reactions in the past. Schedule for drug challenge to spirulina and algea (must be done on 2 separate days at least 1 week apart).  Drug challenge instructions: You must be off antihistamines for 3-5 days before. Must be in good health and not ill. No vaccines/injections/antibiotics within the past 7 days. Plan on being in the office for 2-3 hours and must bring in the drug you want to do the oral challenge for. You must call to schedule an appointment and specify it's for a drug challenge.  You can schedule with either Webb Silversmith or Chrissie on a Monday in the [place] office when I'm in the same office.  They have sooner openings than me.   Food Avoid foods that are bothersome. Will do skin testing in the future and if negative will do bloodwork and if that's negative schedule for food challenges.  For mild symptoms you can take over the counter antihistamines such as Benadryl and monitor symptoms closely. If symptoms worsen or if you have severe symptoms including breathing issues, throat closure, significant swelling, whole body hives, severe diarrhea and vomiting, lightheadedness then inject epinephrine and seek immediate medical care afterwards.  Environmental allergies Plan on allergy testing in the future.   Skin See below for proper skincare. Scalp issues - recommend dermatology evaluation.  Follow up for supplement drug challenge  Skin care recommendations  Bath time: Always use lukewarm water. AVOID very hot or cold water. Keep bathing time to 5-10 minutes. Do NOT use bubble bath. Use a mild soap and use just enough to wash the dirty areas. Do NOT scrub skin vigorously.  After bathing, pat dry your skin with a towel. Do NOT rub or scrub the skin.  Moisturizers and prescriptions:  ALWAYS apply moisturizers immediately after bathing (within 3 minutes). This helps to lock-in moisture. Use the moisturizer several times  a day over the whole body. Good summer moisturizers include: Aveeno, CeraVe, Cetaphil. Good winter moisturizers include: Aquaphor, Vaseline, Cerave, Cetaphil, Eucerin, Vanicream. When using moisturizers along with medications, the moisturizer should be applied about one hour after applying the medication to prevent diluting effect of the medication or moisturize around where you applied the medications. When not using medications, the moisturizer can be continued twice daily as maintenance.  Laundry and clothing: Avoid laundry products with added color or perfumes. Use unscented hypo-allergenic laundry products such as Tide free, Cheer free & gentle, and All free and clear.  If the skin still seems dry or sensitive, you can try double-rinsing the clothes. Avoid tight or scratchy clothing such as wool. Do not use fabric softeners or dyer sheets.

## 2022-10-25 ENCOUNTER — Ambulatory Visit: Payer: Medicare Other | Admitting: Family Medicine

## 2022-10-25 ENCOUNTER — Encounter: Payer: Self-pay | Admitting: Allergy

## 2022-10-25 ENCOUNTER — Other Ambulatory Visit (HOSPITAL_BASED_OUTPATIENT_CLINIC_OR_DEPARTMENT_OTHER): Payer: Self-pay

## 2022-10-25 DIAGNOSIS — Z87892 Personal history of anaphylaxis: Secondary | ICD-10-CM | POA: Insufficient documentation

## 2022-10-25 NOTE — Assessment & Plan Note (Addendum)
Avoiding multiple foods for the past 3 years and interested in reintroduction. She is also wanting to take an over the counter spirulina algae supplement but hesitant due to her past reactions to various foods and medications. She had bloodwork done in 2021 which was essentially negative.  Unable to skin test today as [facility] won't allow new patient visits and procedures on the same day.  Discussed with patient that there is no skin testing to spirulina and algae available in our office. Best thing is to bring the supplement and to skin test to the supplements and if negative do an in office drug challenge. Patient interested in this option and will schedule challenge.  Avoid foods that caused issues in the past. Plan on skin testing in the future and if negative will do bloodwork. If bloodwork is negative then will schedule for in office food challenges.  Discussed in detail with patient that all 3 steps need to be completed to rule out food allergies given her clinical history.  For mild symptoms you can take over the counter antihistamines such as Benadryl and monitor symptoms closely. If symptoms worsen or if you have severe symptoms including breathing issues, throat closure, significant swelling, whole body hives, severe diarrhea and vomiting, lightheadedness then inject epinephrine and seek immediate medical care afterwards. Action plan given.

## 2022-10-25 NOTE — Assessment & Plan Note (Addendum)
Perennial rhinitis symptoms. No prior environmental testing. Unable to skin test today as [facility] won't allow new patient visits and procedures on the same day.  Return for environmental panel skin prick testing. 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 Will opt for bloodwork of skin prick testing does not show significant positives.  Will make additional recommendations based on results.

## 2022-10-25 NOTE — Assessment & Plan Note (Signed)
Continue to avoid items of drug allergy list.

## 2022-10-25 NOTE — Assessment & Plan Note (Signed)
History of idiopathic anaphylaxis. Tryptase level was 4.9 in 2021. Monitor symptoms. For mild symptoms you can take over the counter antihistamines such as Benadryl and monitor symptoms closely. If symptoms worsen or if you have severe symptoms including breathing issues, throat closure, significant swelling, whole body hives, severe diarrhea and vomiting, lightheadedness then inject epinephrine and seek immediate medical care afterwards. Action plan in place.

## 2022-10-31 ENCOUNTER — Ambulatory Visit: Payer: 59 | Admitting: Family

## 2022-12-12 ENCOUNTER — Ambulatory Visit: Payer: 59 | Admitting: Allergy

## 2022-12-19 ENCOUNTER — Other Ambulatory Visit: Payer: Self-pay | Admitting: Physician Assistant

## 2022-12-19 ENCOUNTER — Telehealth: Payer: Self-pay | Admitting: Physician Assistant

## 2022-12-19 DIAGNOSIS — F419 Anxiety disorder, unspecified: Secondary | ICD-10-CM

## 2022-12-19 MED ORDER — ALPRAZOLAM 0.5 MG PO TABS: ORAL_TABLET | ORAL | 1 refills | Status: DC

## 2022-12-19 NOTE — Telephone Encounter (Signed)
Patient called to let us know she changed pharmacies to [facility] on [address] in [facility] She is requesting a refill on Alprazolam 0.5mg 

## 2022-12-25 ENCOUNTER — Other Ambulatory Visit: Payer: Self-pay

## 2022-12-25 DIAGNOSIS — E039 Hypothyroidism, unspecified: Secondary | ICD-10-CM

## 2022-12-25 DIAGNOSIS — Z13220 Encounter for screening for lipoid disorders: Secondary | ICD-10-CM

## 2022-12-25 DIAGNOSIS — Z131 Encounter for screening for diabetes mellitus: Secondary | ICD-10-CM

## 2022-12-25 NOTE — Progress Notes (Signed)
Pts request for labs

## 2022-12-26 ENCOUNTER — Ambulatory Visit: Payer: 59 | Admitting: Physician Assistant

## 2022-12-26 ENCOUNTER — Encounter: Payer: Self-pay | Admitting: Physician Assistant

## 2022-12-26 LAB — COMPLETE METABOLIC PANEL WITH GFR
ALT: 15 U/L (ref 6–29)
Potassium: 4.3 mmol/L (ref 3.5–5.3)

## 2022-12-26 LAB — LIPID PANEL W/REFLEX DIRECT LDL: LDL Cholesterol (Calc): 140 mg/dL (calc) — ABNORMAL HIGH

## 2022-12-26 NOTE — Progress Notes (Signed)
Jamie [name],   Cholesterol elevated.  CV risk above 7.5 percent and statin is suggested to start.   Marland Kitchen.The 10-year ASCVD risk score (Arnett DK, et al., 2019) is: 11.8%   Values used to calculate the score:     Age: 72 years     Sex: Female     Is Non-Hispanic African American: No     Diabetic: No     Tobacco smoker: Yes     Systolic Blood Pressure: 112 mmHg     Is BP treated: No     HDL Cholesterol: 78 mg/dL     Total Cholesterol: 245 mg/dL  Kidney function down some.  Are you taking anti-inflammatories?  Recheck in 2 weeks.   B12 low normal. Do you want to start shots?  Vitamin D low normal. I would start 5000units daily with dairy.    Other labs pending.

## 2022-12-28 ENCOUNTER — Encounter: Payer: Self-pay | Admitting: Physician Assistant

## 2022-12-28 LAB — LIPID PANEL W/REFLEX DIRECT LDL
Cholesterol: 245 mg/dL — ABNORMAL HIGH (ref <200)
Triglycerides: 138 mg/dL (ref <150)

## 2022-12-28 LAB — COMPLETE METABOLIC PANEL WITH GFR
AG Ratio: 1.8 (calc) (ref 1.0–2.5)
Alkaline phosphatase (APISO): 64 U/L (ref 37–153)
BUN: 15 mg/dL (ref 7–25)
Glucose, Bld: 68 mg/dL (ref 65–99)
Sodium: 140 mmol/L (ref 135–146)

## 2022-12-28 MED ORDER — ONDANSETRON 8 MG PO TBDP: 8.0000 mg | ORAL_TABLET | Freq: Three times a day (TID) | ORAL | 1 refills | Status: DC | PRN

## 2022-12-29 ENCOUNTER — Encounter: Payer: Self-pay | Admitting: Physician Assistant

## 2023-01-01 LAB — COMPLETE METABOLIC PANEL WITH GFR
AST: 22 U/L (ref 10–35)
Albumin: 4.7 g/dL (ref 3.6–5.1)
BUN/Creatinine Ratio: 15 (calc) (ref 6–22)
CO2: 28 mmol/L (ref 20–32)
Calcium: 9.5 mg/dL (ref 8.6–10.4)
Chloride: 102 mmol/L (ref 98–110)
Creat: 1.03 mg/dL — ABNORMAL HIGH (ref 0.60–1.00)
Globulin: 2.6 g/dL (calc) (ref 1.9–3.7)
Total Bilirubin: 0.5 mg/dL (ref 0.2–1.2)
Total Protein: 7.3 g/dL (ref 6.1–8.1)
eGFR: 58 mL/min/{1.73_m2} — ABNORMAL LOW (ref >=60)

## 2023-01-01 LAB — LIPID PANEL W/REFLEX DIRECT LDL
HDL: 78 mg/dL (ref >=50)
Non-HDL Cholesterol (Calc): 167 mg/dL (calc) — ABNORMAL HIGH (ref <130)
Total CHOL/HDL Ratio: 3.1 (calc) (ref <5.0)

## 2023-01-01 LAB — VITAMIN D 25 HYDROXY (VIT D DEFICIENCY, FRACTURES): Vit D, 25-Hydroxy: 33 ng/mL (ref 30–100)

## 2023-01-01 LAB — B12 AND FOLATE PANEL
Folate: 20.5 ng/mL
Vitamin B-12: 298 pg/mL (ref 200–1100)

## 2023-01-01 LAB — LIPOPROTEIN A (LPA): Lipoprotein (a): <10 nmol/L (ref <75)

## 2023-01-01 LAB — VITAMIN B1: Vitamin B1 (Thiamine): 19 nmol/L (ref 8–30)

## 2023-01-02 ENCOUNTER — Telehealth: Payer: Self-pay

## 2023-01-02 ENCOUNTER — Encounter: Payer: Self-pay | Admitting: Physician Assistant

## 2023-01-02 NOTE — Telephone Encounter (Signed)
Patient came into office to see about her getting  Ultrasound instead of mammogram due to shingles, please advise, thanks.

## 2023-01-03 ENCOUNTER — Encounter: Payer: Self-pay | Admitting: Physician Assistant

## 2023-01-09 NOTE — Telephone Encounter (Signed)
I am sorry, am I missing something.  I have no idea what she is actually asking.  I also need the name of her son and his date of birth etc.  And it probably needs to be entered into his chart.  But I am not clear what she is actually asking?

## 2023-01-09 NOTE — Telephone Encounter (Signed)
Spoke with Royann Shivers and patient scheduled a visit with Tandy Gaw on June 5th to discuss the MRI

## 2023-01-13 ENCOUNTER — Encounter: Payer: Self-pay | Admitting: Physician Assistant

## 2023-01-15 ENCOUNTER — Encounter: Payer: Self-pay | Admitting: Physician Assistant

## 2023-01-16 ENCOUNTER — Ambulatory Visit (INDEPENDENT_AMBULATORY_CARE_PROVIDER_SITE_OTHER): Payer: 59 | Admitting: Physician Assistant

## 2023-01-16 DIAGNOSIS — E538 Deficiency of other specified B group vitamins: Secondary | ICD-10-CM | POA: Diagnosis not present

## 2023-01-16 MED ORDER — CYANOCOBALAMIN 1000 MCG/ML IJ SOLN
1000.0000 ug | Freq: Once | INTRAMUSCULAR | Status: DC
Start: 2023-01-16 — End: 2024-07-03

## 2023-01-18 NOTE — Progress Notes (Signed)
   Established Patient Office Visit  Subjective   Patient ID: Jamie [name], female    DOB: Sep 16, 1950  Age: 72 y.o. MRN: [id number]  Chief Complaint  Patient presents with   Follow-up    HPI Pt came in with friend to office visit and requested her monthly b12 shot.   ROS    Objective:       Assessment & Plan:  Marland KitchenMarland KitchenLonita was seen today for follow-up.  Diagnoses and all orders for this visit:  B12 deficiency -     cyanocobalamin (VITAMIN B12) injection 1,000 mcg   Given IM injection today without complications.    Jamie Gaw, PA-C

## 2023-01-22 ENCOUNTER — Encounter: Payer: Self-pay | Admitting: Physician Assistant

## 2023-01-25 ENCOUNTER — Encounter: Payer: Self-pay | Admitting: Physician Assistant

## 2023-01-25 ENCOUNTER — Ambulatory Visit (INDEPENDENT_AMBULATORY_CARE_PROVIDER_SITE_OTHER): Payer: 59 | Admitting: Physician Assistant

## 2023-01-25 VITALS — BP 109/70 | HR 86 | Ht 65.5 in | Wt 139.0 lb

## 2023-01-25 DIAGNOSIS — R350 Frequency of micturition: Secondary | ICD-10-CM

## 2023-01-25 DIAGNOSIS — R8290 Unspecified abnormal findings in urine: Secondary | ICD-10-CM

## 2023-01-25 DIAGNOSIS — K5792 Diverticulitis of intestine, part unspecified, without perforation or abscess without bleeding: Secondary | ICD-10-CM

## 2023-01-25 DIAGNOSIS — N390 Urinary tract infection, site not specified: Secondary | ICD-10-CM

## 2023-01-25 DIAGNOSIS — R944 Abnormal results of kidney function studies: Secondary | ICD-10-CM

## 2023-01-25 LAB — CBC WITH DIFFERENTIAL/PLATELET
Basophils Absolute: 62 cells/uL (ref 0–200)
HCT: 37.7 % (ref 35.0–45.0)
Monocytes Relative: 9.3 %
Neutro Abs: 3534 cells/uL (ref 1500–7800)
RDW: 11.6 % (ref 11.0–15.0)

## 2023-01-25 MED ORDER — AMOXICILLIN-POT CLAVULANATE 875-125 MG PO TABS
1.0000 | ORAL_TABLET | Freq: Two times a day (BID) | ORAL | 0 refills | Status: DC
Start: 2023-01-25 — End: 2023-03-19

## 2023-01-25 NOTE — Patient Instructions (Addendum)
Get labs Will get CT ordered Start augmentin
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
## 2023-01-26 LAB — COMPLETE METABOLIC PANEL WITH GFR
AG Ratio: 1.9 (calc) (ref 1.0–2.5)
ALT: 16 U/L (ref 6–29)
AST: 20 U/L (ref 10–35)
Albumin: 4.5 g/dL (ref 3.6–5.1)
Alkaline phosphatase (APISO): 56 U/L (ref 37–153)
BUN: 18 mg/dL (ref 7–25)
CO2: 30 mmol/L (ref 20–32)
Calcium: 10.2 mg/dL (ref 8.6–10.4)
Chloride: 104 mmol/L (ref 98–110)
Creat: 0.88 mg/dL (ref 0.60–1.00)
Globulin: 2.4 g/dL (calc) (ref 1.9–3.7)
Glucose, Bld: 109 mg/dL — ABNORMAL HIGH (ref 65–99)
Potassium: 5.4 mmol/L — ABNORMAL HIGH (ref 3.5–5.3)
Sodium: 142 mmol/L (ref 135–146)
Total Bilirubin: 0.5 mg/dL (ref 0.2–1.2)
Total Protein: 6.9 g/dL (ref 6.1–8.1)
eGFR: 71 mL/min/{1.73_m2} (ref >=60)

## 2023-01-26 LAB — CBC WITH DIFFERENTIAL/PLATELET
Absolute Monocytes: 521 cells/uL (ref 200–950)
Basophils Relative: 1.1 %
Eosinophils Absolute: 50 cells/uL (ref 15–500)
Eosinophils Relative: 0.9 %
Hemoglobin: 12.9 g/dL (ref 11.7–15.5)
Lymphs Abs: 1434 cells/uL (ref 850–3900)
MCH: 31.4 pg (ref 27.0–33.0)
MCHC: 34.2 g/dL (ref 32.0–36.0)
MCV: 91.7 fL (ref 80.0–100.0)
MPV: 10.9 fL (ref 7.5–12.5)
Neutrophils Relative %: 63.1 %
Platelets: 284 10*3/uL (ref 140–400)
RBC: 4.11 10*6/uL (ref 3.80–5.10)
Total Lymphocyte: 25.6 %
WBC: 5.6 10*3/uL (ref 3.8–10.8)

## 2023-01-26 LAB — URINE CULTURE
MICRO NUMBER:: 15084575
SPECIMEN QUALITY:: ADEQUATE

## 2023-01-27 ENCOUNTER — Encounter: Payer: Self-pay | Admitting: Physician Assistant

## 2023-01-28 ENCOUNTER — Other Ambulatory Visit: Payer: Self-pay | Admitting: *Deleted

## 2023-01-28 DIAGNOSIS — E875 Hyperkalemia: Secondary | ICD-10-CM

## 2023-01-28 NOTE — Progress Notes (Signed)
No significant bacteria detected in urine culture to indicate UTI.  WBC normal.  Kidney function is MUCH better.  Potassium is elevated a little. Are you taking any extra potassium? If so stop.  Recheck potassium in 1 week.

## 2023-01-30 ENCOUNTER — Ambulatory Visit: Payer: 59

## 2023-01-30 ENCOUNTER — Encounter: Payer: Self-pay | Admitting: Physician Assistant

## 2023-01-30 ENCOUNTER — Ambulatory Visit (INDEPENDENT_AMBULATORY_CARE_PROVIDER_SITE_OTHER): Payer: 59

## 2023-01-30 DIAGNOSIS — Z780 Asymptomatic menopausal state: Secondary | ICD-10-CM

## 2023-01-30 DIAGNOSIS — Z0000 Encounter for general adult medical examination without abnormal findings: Secondary | ICD-10-CM

## 2023-01-30 DIAGNOSIS — I728 Aneurysm of other specified arteries: Secondary | ICD-10-CM

## 2023-01-30 NOTE — Progress Notes (Signed)
Bone density has worsened from -2.8 to -3.0 and in osteoporosis range. In addition to vitamin D and calcium and regular exercise it is suggested that you start medication to increase bone density. Lets do a virtual to discuss.

## 2023-01-31 ENCOUNTER — Encounter: Payer: Self-pay | Admitting: Physician Assistant

## 2023-01-31 MED ORDER — LANCETS 30G MISC: 12 refills | Status: AC

## 2023-01-31 MED ORDER — ONETOUCH ULTRA MINI W/DEVICE KIT: PACK | 0 refills | Status: DC

## 2023-01-31 MED ORDER — ONETOUCH ULTRA VI STRP: ORAL_STRIP | 12 refills | Status: DC

## 2023-02-01 ENCOUNTER — Encounter: Payer: Self-pay | Admitting: Physician Assistant

## 2023-02-04 ENCOUNTER — Ambulatory Visit (INDEPENDENT_AMBULATORY_CARE_PROVIDER_SITE_OTHER): Payer: 59 | Admitting: Physician Assistant

## 2023-02-04 ENCOUNTER — Encounter: Payer: Self-pay | Admitting: Physician Assistant

## 2023-02-04 VITALS — BP 121/60 | HR 86 | Ht 65.5 in | Wt 137.0 lb

## 2023-02-04 DIAGNOSIS — R7301 Impaired fasting glucose: Secondary | ICD-10-CM

## 2023-02-04 DIAGNOSIS — N3281 Overactive bladder: Secondary | ICD-10-CM

## 2023-02-04 DIAGNOSIS — R1311 Dysphagia, oral phase: Secondary | ICD-10-CM

## 2023-02-04 DIAGNOSIS — R350 Frequency of micturition: Secondary | ICD-10-CM | POA: Diagnosis not present

## 2023-02-04 DIAGNOSIS — E538 Deficiency of other specified B group vitamins: Secondary | ICD-10-CM

## 2023-02-04 DIAGNOSIS — Z0000 Encounter for general adult medical examination without abnormal findings: Secondary | ICD-10-CM | POA: Diagnosis not present

## 2023-02-04 DIAGNOSIS — E875 Hyperkalemia: Secondary | ICD-10-CM

## 2023-02-04 DIAGNOSIS — I728 Aneurysm of other specified arteries: Secondary | ICD-10-CM

## 2023-02-04 LAB — POCT URINALYSIS DIP (CLINITEK)
Bilirubin, UA: NEGATIVE
Blood, UA: NEGATIVE
Glucose, UA: NEGATIVE mg/dL
Ketones, POC UA: NEGATIVE mg/dL
Nitrite, UA: NEGATIVE
POC PROTEIN,UA: NEGATIVE
Spec Grav, UA: 1.015 (ref 1.010–1.025)
Urobilinogen, UA: 0.2 E.U./dL
pH, UA: 6 (ref 5.0–8.0)

## 2023-02-04 MED ORDER — CYANOCOBALAMIN 1000 MCG/ML IJ SOLN
500.0000 ug | Freq: Once | INTRAMUSCULAR | Status: AC
Start: 2023-02-04 — End: 2023-02-04
  Administered 2023-02-04: 500 ug via INTRAMUSCULAR

## 2023-02-04 NOTE — Patient Instructions (Addendum)
Ultrasound of neck and CT.   Mirabegron Extended-Release Tablets What is this medication? MIRABEGRON (MIR a BEG ron) treats symptoms of an overactive bladder, such as loss of bladder control or frequent need to urinate. It works by relaxing muscles in the bladder. It belongs to a group of medications called antispasmodics. This medicine may be used for other purposes; ask your health care provider or pharmacist if you have questions. COMMON BRAND NAME(S): Myrbetriq What should I tell my care team before I take this medication? They need to know if you have any of these conditions: High blood pressure Kidney disease Liver disease Prostate disease Trouble passing urine An unusual or allergic reaction to mirabegron, other medications, foods, dyes, or preservatives Pregnant or trying to get pregnant Breast-feeding How should I use this medication? Take this medication by mouth with water. Take it as directed on the prescription label at the same time every day. Do not cut, crush, or chew this medication. Swallow the tablets whole. Adults can take it with or without food. Children should take it with food. Keep taking it unless your care team tells you to stop. Talk to your care team about the use of this medication in children. While it may be prescribed for children as young as 3 years for selected conditions, precautions do apply. Overdosage: If you think you have taken too much of this medicine contact a poison control center or emergency room at once. NOTE: This medicine is only for you. Do not share this medicine with others. What if I miss a dose? If you miss a dose, take it as soon as you can unless it is more than 12 hours late. If it is more than 12 hours late, skip the missed dose. Take the next dose at the normal time. What may interact with this medication? Codeine Desipramine Digoxin Flecainide MAOIs, such as Carbex, Eldepryl, Marplan, Nardil, and  Parnate Methadone Metoprolol Pimozide Propafenone Thioridazine Warfarin This list may not describe all possible interactions. Give your health care provider a list of all the medicines, herbs, non-prescription drugs, or dietary supplements you use. Also tell them if you smoke, drink alcohol, or use illegal drugs. Some items may interact with your medicine. What should I watch for while using this medication? Visit your care team for regular checks on your progress. Tell your care team if your symptoms do not start to get better or if they get worse. Check your blood pressure as directed. Know what your blood pressure should be and when to contact your care team. What side effects may I notice from receiving this medication? Side effects that you should report to your care team as soon as possible: Allergic reactions or angioedema--skin rash, itching or hives, swelling of the face, eyes, lips, tongue, arms, or legs, trouble swallowing or breathing Increase in blood pressure Trouble passing urine Side effects that usually do not require medical attention (report to your care team if they continue or are bothersome): Headache Runny or stuffy nose Sore throat Stomach pain This list may not describe all possible side effects. Call your doctor for medical advice about side effects. You may report side effects to FDA at 1-800-FDA-1088. Where should I keep my medication? Keep out of the reach of children and pets. Store at room temperature between 20 and 25 degrees C (68 and 77 degrees F). Get rid of any unused medication after the expiration date. To get rid of medications that are no longer needed or have expired: Take  the medication to a medication take-back program. Check with your pharmacy or law enforcement to find a location. If you cannot return the medication, check the label or package insert to see if the medication should be thrown out in the garbage or flushed down the toilet. If you  are not sure, ask your care team. If it is safe to put it in the trash, empty the medication out of the container. Mix the medication with cat litter, dirt, coffee grounds, or other unwanted substance. Seal the mixture in a bag or container. Put it in the trash. NOTE: This sheet is a summary. It may not cover all possible information. If you have questions about this medicine, talk to your doctor, pharmacist, or health care provider.  2024 Elsevier/Gold Standard (2021-09-27 00:00:00)

## 2023-02-04 NOTE — Progress Notes (Unsigned)
Complete physical exam  Patient: Jamie [name]   DOB: 1951-02-28   72 y.o. Female  MRN: [id number]  Subjective:    Chief Complaint  Patient presents with   Annual Exam    Jamie [name] is a 72 y.o. female who presents today for a complete physical exam. She reports consuming a {diet types:17450} diet. {types:19826} She generally feels {DESC; WELL/FAIRLY WELL/POORLY:18703}. She reports sleeping {DESC; WELL/FAIRLY WELL/POORLY:18703}. She {does/does not:200015} have additional problems to discuss today.    Most recent fall risk assessment:    02/04/2023    9:02 AM  Fall Risk   Falls in the past year? 0  Number falls in past yr: 0  Injury with Fall? 0  Risk for fall due to : No Fall Risks  Follow up Falls evaluation completed     Most recent depression screenings:    02/04/2023    9:02 AM 10/16/2022   10:27 AM  PHQ 2/9 Scores  PHQ - 2 Score 2 1    {VISON DENTAL STD PSA (Optional):27386}  {History (Optional):23778}  Patient Care Team: Nolene Ebbs as PCP - General (Family Medicine)   Outpatient Medications Prior to Visit  Medication Sig   albuterol (PROVENTIL HFA) 108 (90 Base) MCG/ACT inhaler Inhale 2 puffs into the lungs every 6 (six) hours as needed for wheezing or shortness of breath.   ALPRAZolam (XANAX) 0.5 MG tablet TAKE 1 TABLET(0.5 MG) BY MOUTH TWICE DAILY AS NEEDED FOR SLEEP   amoxicillin-clavulanate (AUGMENTIN) 875-125 MG tablet Take 1 tablet by mouth 2 (two) times daily.   ASPIRIN LOW DOSE 81 MG tablet Take 81 mg by mouth daily.   b complex vitamins capsule Take 1 capsule by mouth daily.   Blood Glucose Monitoring Suppl (ACCU-CHEK GUIDE ME) w/Device KIT Dx R73.09 Elevated glucose - Check blood sugar 4 times daily.   Blood Glucose Monitoring Suppl (ONE TOUCH ULTRA MINI) w/Device KIT Check blood sugar twice daily.   cholecalciferol (VITAMIN D3) 25 MCG (1000 UNIT) tablet Take 1,000 Units by mouth daily.   clobetasol cream (TEMOVATE) 0.05 % Apply 1  Application topically 2 (two) times daily.   conjugated estrogens (PREMARIN) vaginal cream APPLY VAGINALLY 3 TIMES A WEEK AT NIGHT   EPINEPHrine 0.3 mg/0.3 mL IJ SOAJ injection Inject 0.3 mg into the muscle as needed for anaphylaxis.   estradiol (ESTRACE) 0.1 MG/GM vaginal cream Place 1 Applicatorful vaginally daily.   gabapentin (NEURONTIN) 100 MG capsule Take 1 capsule (100 mg total) by mouth 3 (three) times daily.   glucose blood (ONETOUCH ULTRA) test strip Check blood sugar twice daily.   Lancets 30G MISC Check blood sugar twice daily.   linaclotide (LINZESS) 72 MCG capsule Take 1 capsule (72 mcg total) by mouth daily before breakfast.   ondansetron (ZOFRAN-ODT) 8 MG disintegrating tablet Take 1 tablet (8 mg total) by mouth every 8 (eight) hours as needed for nausea.   OVER THE COUNTER MEDICATION Take 2 capsules by mouth daily. Friendly four   pantoprazole (PROTONIX) 40 MG tablet Take 1 tablet (40 mg total) by mouth in the morning and at bedtime. As needed   PEPCID 20 MG tablet TAKE 1 TABLET BY MOUTH TWICE DAILY   promethazine (PHENERGAN) 25 MG tablet Take 1 tablet (25 mg total) by mouth every 6 (six) hours as needed for nausea or vomiting.   Vitamin D, Ergocalciferol, (DRISDOL) 1.25 MG (50000 UNIT) CAPS capsule Take by mouth.   [DISCONTINUED] tobramycin-dexamethasone (TOBRADEX) ophthalmic solution Place 1 drop into  the right eye 4 (four) times daily.   Facility-Administered Medications Prior to Visit  Medication Dose Route Frequency Provider   cyanocobalamin (VITAMIN B12) injection 1,000 mcg  1,000 mcg Intramuscular Once Paulena Servais L, PA-C    ROS        Objective:     BP 121/60 (BP Location: Right Arm, Patient Position: Sitting, Cuff Size: Normal)   Pulse 86   Ht 5' 5.5" (1.664 m)   Wt 137 lb (62.1 kg)   SpO2 100%   BMI 22.45 kg/m  {Vitals History (Optional):23777}  Physical Exam   Results for orders placed or performed in visit on 02/04/23  POCT URINALYSIS DIP  (CLINITEK)  Result Value Ref Range   Color, UA yellow yellow   Clarity, UA clear clear   Glucose, UA negative negative mg/dL   Bilirubin, UA negative negative   Ketones, POC UA negative negative mg/dL   Spec Grav, UA 7.846 9.629 - 1.025   Blood, UA negative negative   pH, UA 6.0 5.0 - 8.0   POC PROTEIN,UA negative negative, trace   Urobilinogen, UA 0.2 0.2 or 1.0 E.U./dL   Nitrite, UA Negative Negative   Leukocytes, UA Trace (A) Negative   {Show previous labs (optional):23779}    Assessment & Plan:    Routine Health Maintenance and Physical Exam  Immunization History  Administered Date(s) Administered   Td 08/13/2005    Health Maintenance  Topic Date Due   Zoster Vaccines- Shingrix (1 of 2) 05/07/2023 (Originally 12/20/1971)   Pneumonia Vaccine 79+ Years old (1 of 2 - PCV) 10/16/2023 (Originally 12/20/1958)   MAMMOGRAM  10/16/2023 (Originally 04/12/2022)   INFLUENZA VACCINE  03/14/2023   Medicare Annual Wellness (AWV)  01/30/2024   DEXA SCAN  01/29/2025   Colonoscopy  03/21/2027   Hepatitis C Screening  Completed   HPV VACCINES  Aged Out   DTaP/Tdap/Td  Discontinued   COVID-19 Vaccine  Discontinued    Discussed health benefits of physical activity, and encouraged her to engage in regular exercise appropriate for her age and condition.  Problem List Items Addressed This Visit       Unprioritized   Splenic artery aneurysm (HCC)   Urinary frequency   Relevant Orders   Urine Culture   POCT URINALYSIS DIP (CLINITEK) (Completed)   OAB (overactive bladder)   Oral phase dysphagia   Relevant Orders   TSH + free T4   US Soft Tissue Head/Neck (NON-THYROID)   Hyperkalemia   Relevant Orders   COMPLETE METABOLIC PANEL WITH GFR   Elevated fasting glucose   Relevant Orders   COMPLETE METABOLIC PANEL WITH GFR   Hemoglobin A1c   Other Visit Diagnoses     Routine physical examination    -  Primary   Relevant Orders   COMPLETE METABOLIC PANEL WITH GFR   TSH + free T4    Hemoglobin A1c      Return if symptoms worsen or fail to improve.     Tandy Gaw, PA-C

## 2023-02-05 ENCOUNTER — Encounter: Payer: Self-pay | Admitting: Physician Assistant

## 2023-02-05 LAB — COMPLETE METABOLIC PANEL WITH GFR
AG Ratio: 2.2 (calc) (ref 1.0–2.5)
ALT: 17 U/L (ref 6–29)
AST: 23 U/L (ref 10–35)
Albumin: 4.7 g/dL (ref 3.6–5.1)
Alkaline phosphatase (APISO): 53 U/L (ref 37–153)
BUN: 15 mg/dL (ref 7–25)
CO2: 28 mmol/L (ref 20–32)
Calcium: 9.9 mg/dL (ref 8.6–10.4)
Chloride: 106 mmol/L (ref 98–110)
Creat: 0.79 mg/dL (ref 0.60–1.00)
Globulin: 2.1 g/dL (calc) (ref 1.9–3.7)
Glucose, Bld: 94 mg/dL (ref 65–99)
Potassium: 4.9 mmol/L (ref 3.5–5.3)
Sodium: 143 mmol/L (ref 135–146)
Total Bilirubin: 0.6 mg/dL (ref 0.2–1.2)
Total Protein: 6.8 g/dL (ref 6.1–8.1)
eGFR: 80 mL/min/{1.73_m2} (ref >=60)

## 2023-02-05 LAB — HEMOGLOBIN A1C
Hgb A1c MFr Bld: 5.4 % of total Hgb (ref <5.7)
Mean Plasma Glucose: 108 mg/dL
eAG (mmol/L): 6 mmol/L

## 2023-02-05 LAB — TSH+FREE T4: TSH W/REFLEX TO FT4: 1.44 mIU/L (ref 0.40–4.50)

## 2023-02-05 NOTE — Progress Notes (Signed)
Kidney function looks good and continues to improve.  Potassium normal range.  Thyroid looks great.  A1C normal range! Up from 1 year ago. Continue to limit carbs and sugars and get 150 minutes of exercise a week!

## 2023-02-06 LAB — URINE CULTURE
MICRO NUMBER:: 15124828
SPECIMEN QUALITY:: ADEQUATE

## 2023-02-08 NOTE — Progress Notes (Signed)
No concerning bacterial growth on urine culture.

## 2023-02-10 ENCOUNTER — Encounter: Payer: Self-pay | Admitting: Physician Assistant

## 2023-02-11 ENCOUNTER — Ambulatory Visit (INDEPENDENT_AMBULATORY_CARE_PROVIDER_SITE_OTHER): Payer: 59

## 2023-02-11 DIAGNOSIS — I728 Aneurysm of other specified arteries: Secondary | ICD-10-CM | POA: Diagnosis not present

## 2023-02-11 DIAGNOSIS — R1311 Dysphagia, oral phase: Secondary | ICD-10-CM

## 2023-02-12 ENCOUNTER — Encounter: Payer: Self-pay | Admitting: Physician Assistant

## 2023-02-12 NOTE — Progress Notes (Signed)
U/S of neck is normal with no concerns. No masses that are concerning. Very small thyroid nodules that do not need any work up. Left submandibular lymph node noted.

## 2023-02-13 NOTE — Progress Notes (Signed)
Overall good scan. Some non-specific findings but not urgent.

## 2023-02-28 ENCOUNTER — Other Ambulatory Visit: Payer: Self-pay | Admitting: Physician Assistant

## 2023-02-28 ENCOUNTER — Encounter: Payer: Self-pay | Admitting: Physician Assistant

## 2023-02-28 DIAGNOSIS — F419 Anxiety disorder, unspecified: Secondary | ICD-10-CM

## 2023-03-06 IMAGING — RF DG SWALLOWING FUNCTION
5 series · 15 of 20 positions shown · non-contrast
Comparison: None.

CLINICAL DATA: Dysphagia.

EXAM:
MODIFIED BARIUM SWALLOW
TECHNIQUE: Different consistencies of barium were administered orally to the
patient by the Speech Pathologist. Imaging of the pharynx was
performed in the lateral projection. The radiologist PA was present
in the fluoroscopy room for this study, providing personal
supervision.
FLUOROSCOPY TIME:  Fluoroscopy Time:  42 seconds
Radiation Exposure Index (if provided by the fluoroscopic device):
Not applicable.
Number of Acquired Spot Images: 0

[Series 1: cp_standard · 3 of 177 frames shown (1 of 5)]
[frame 27/177]
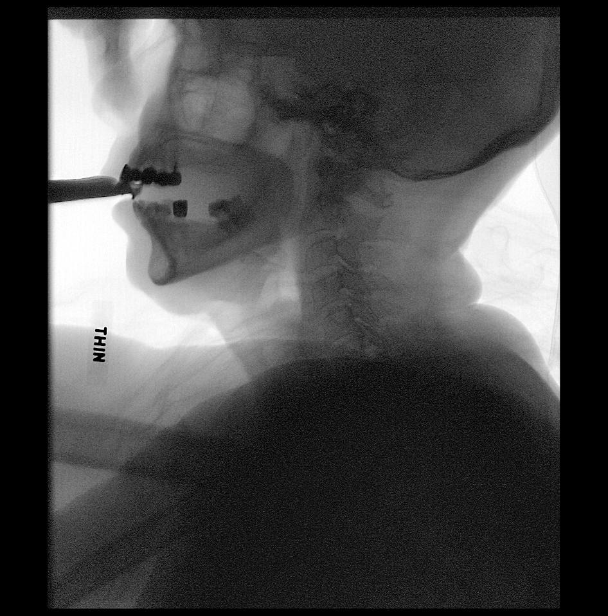
[frame 89/177]
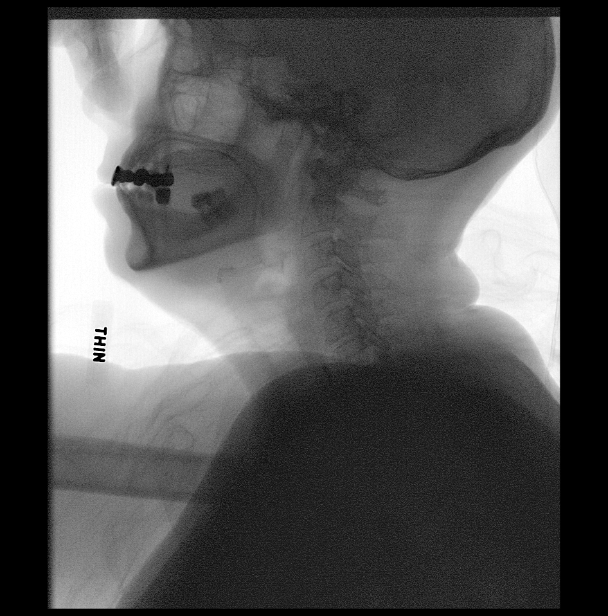
[frame 151/177]
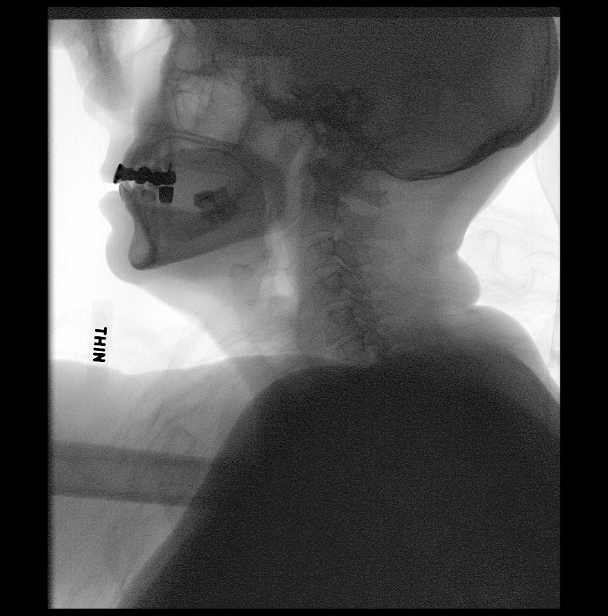

[Series 2: cp_standard · 3 of 190 frames shown (2 of 5)]
[frame 29/190]
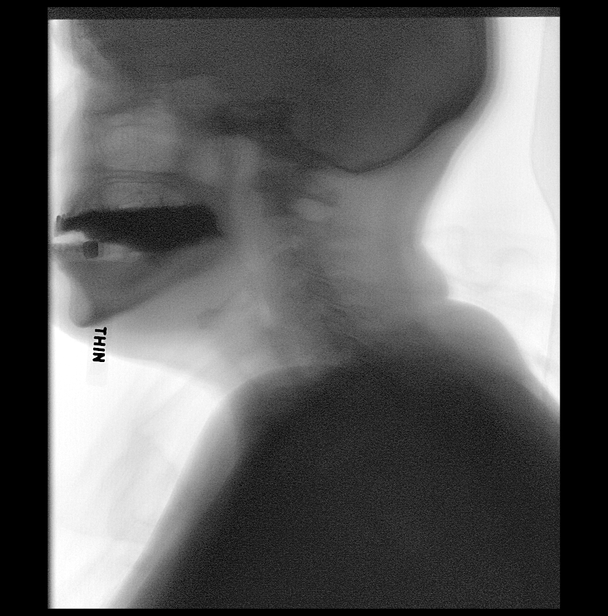
[frame 162/190]
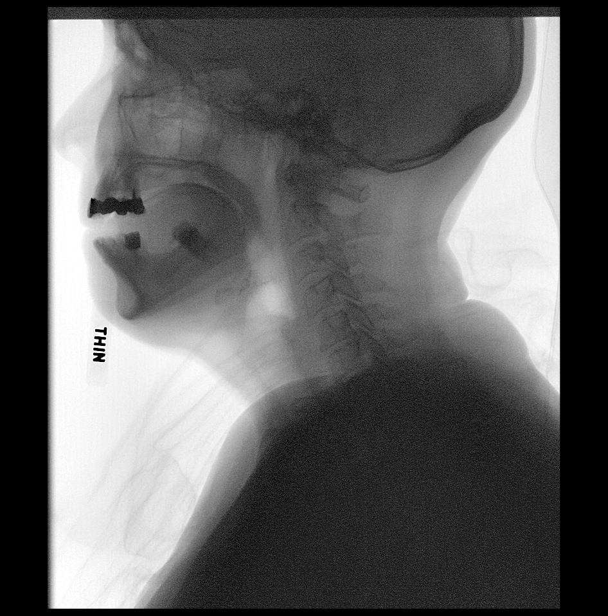
[frame 180/190]
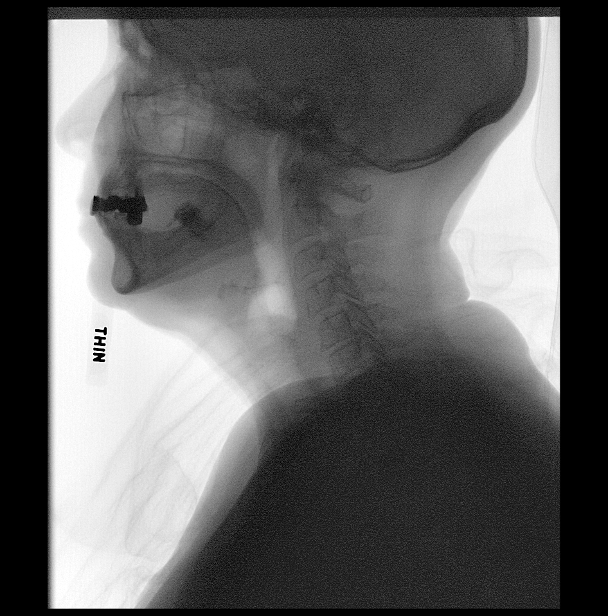

[Series 3: cp_standard · 3 of 336 frames shown (3 of 5)]
[frame 51/336]
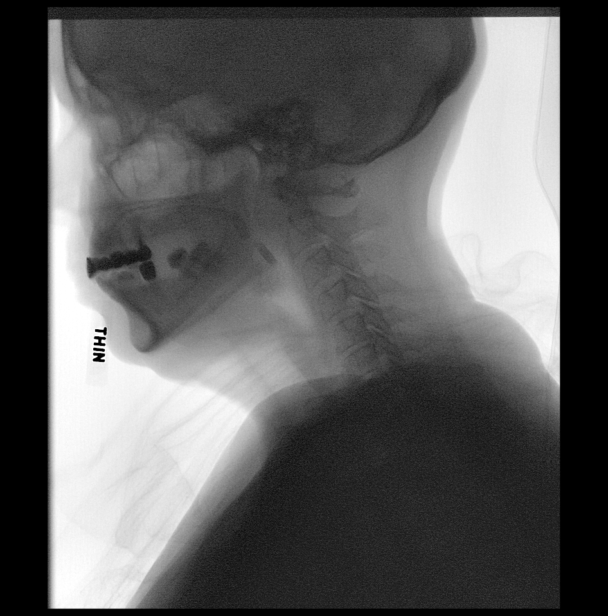
[frame 169/336]
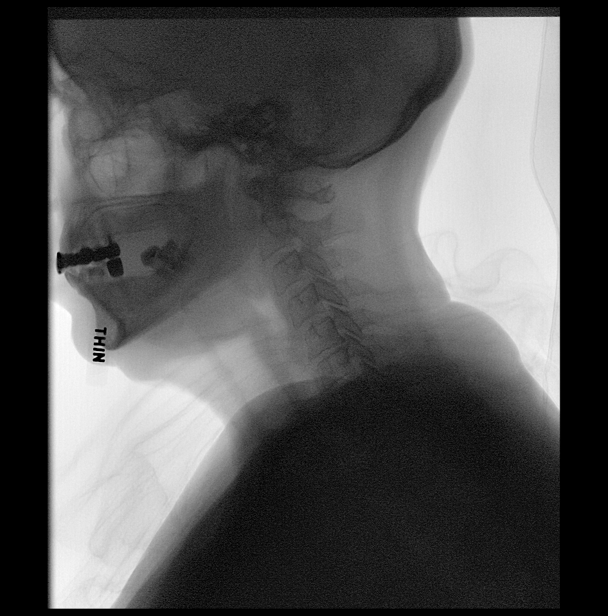
[frame 286/336]
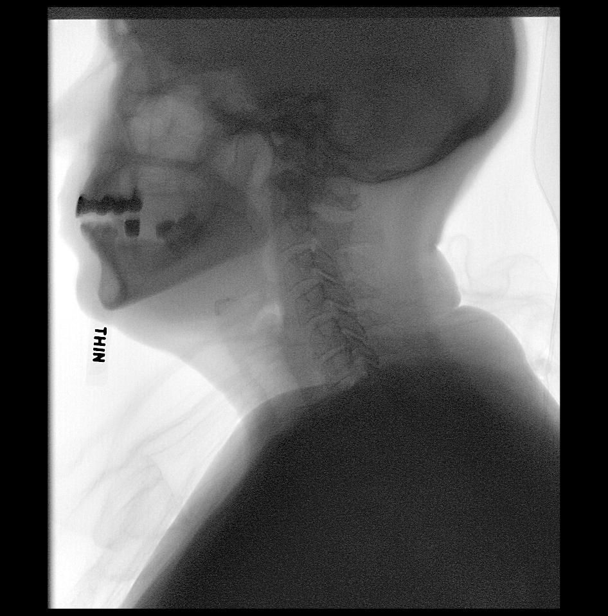

[Series 4: cp_standard · 3 of 198 frames shown (4 of 5)]
[frame 30/198]
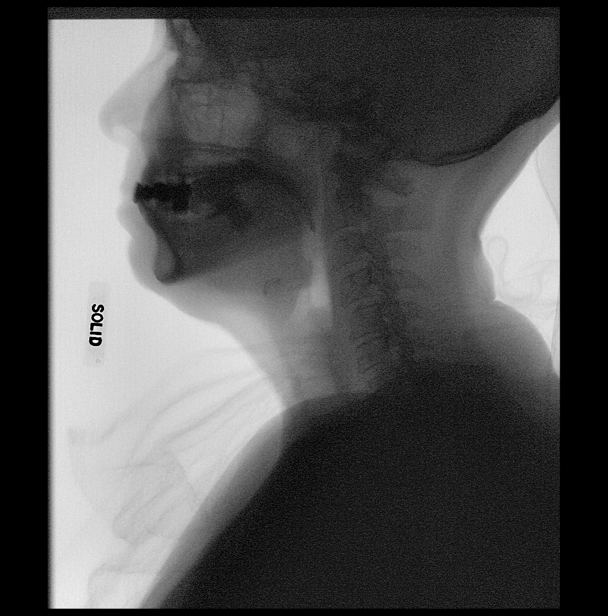
[frame 133/198]
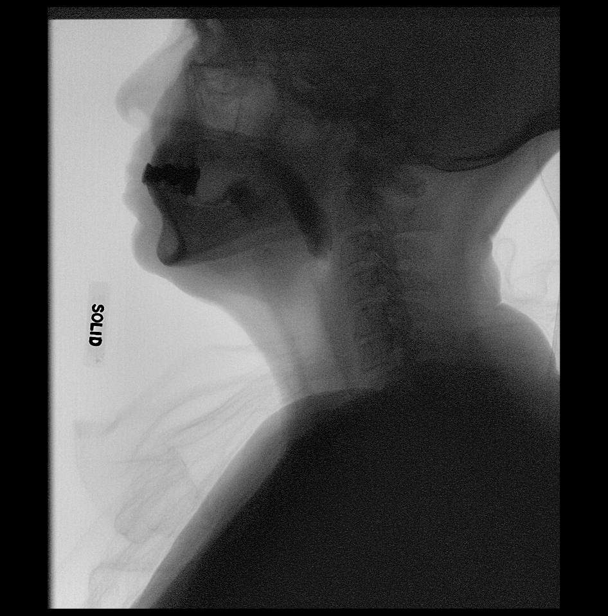
[frame 169/198]
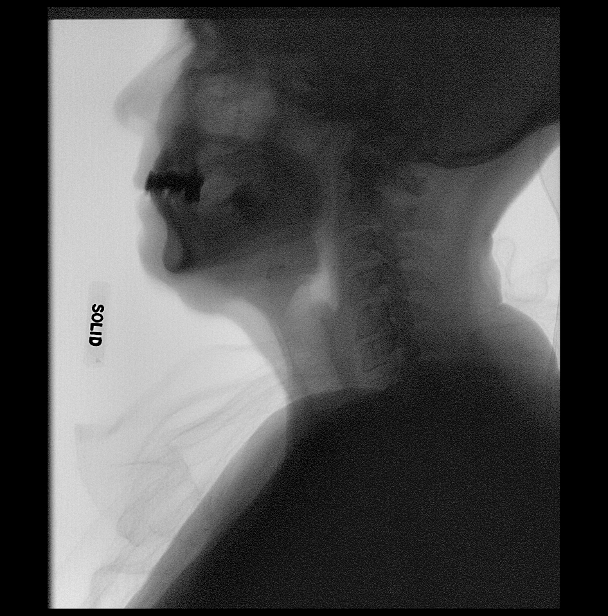

[Series 5: cp_standard · 3 of 180 frames shown (5 of 5)]
[frame 28/180]
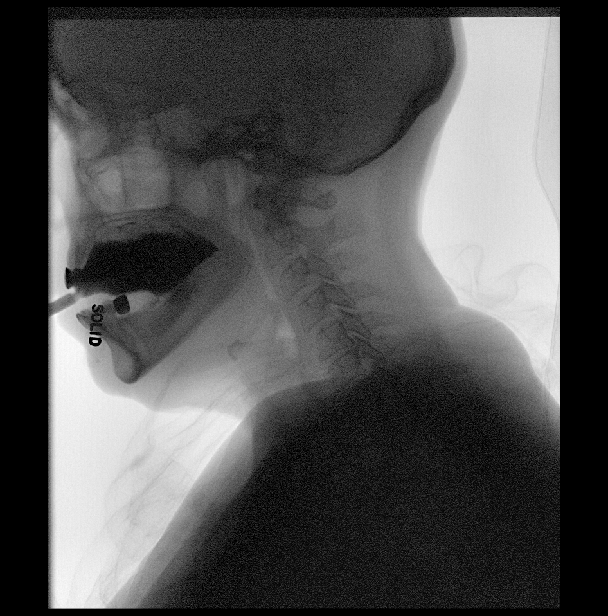
[frame 91/180]
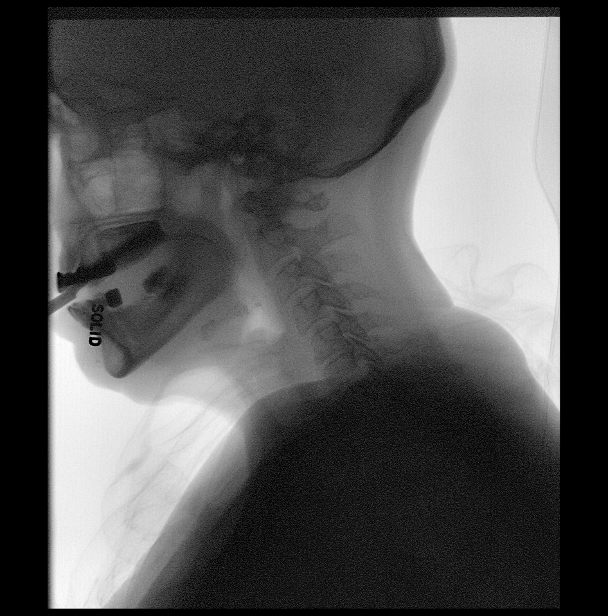
[frame 154/180]
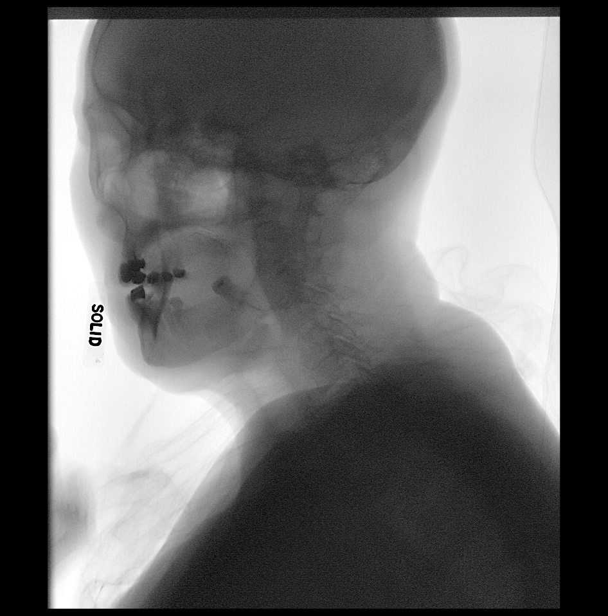

[15 of 20 positions shown; findings below may reference images not displayed]

FINDINGS: Please see speech pathology notes.
IMPRESSION: Please refer to the Speech Pathologists report for complete details
and recommendations.

## 2023-03-11 ENCOUNTER — Encounter: Payer: Self-pay | Admitting: Pulmonary Disease

## 2023-03-11 ENCOUNTER — Encounter: Payer: Self-pay | Admitting: Physician Assistant

## 2023-03-11 DIAGNOSIS — R1012 Left upper quadrant pain: Secondary | ICD-10-CM

## 2023-03-15 ENCOUNTER — Ambulatory Visit (INDEPENDENT_AMBULATORY_CARE_PROVIDER_SITE_OTHER): Payer: 59

## 2023-03-15 DIAGNOSIS — R1012 Left upper quadrant pain: Secondary | ICD-10-CM | POA: Diagnosis not present

## 2023-03-15 NOTE — Progress Notes (Signed)
Spleen looks good. No abnormalities seen.

## 2023-03-15 NOTE — Progress Notes (Signed)
No gallstones or wall thickening.  Bile duct normal diameter Liver appears normal variant Pancrease normal Spleen no aneurysm seen Kidney within normal size  Everything looks within normal ranges.

## 2023-03-17 ENCOUNTER — Encounter: Payer: Self-pay | Admitting: Physician Assistant

## 2023-03-19 ENCOUNTER — Encounter: Payer: Self-pay | Admitting: Physician Assistant

## 2023-03-19 ENCOUNTER — Telehealth (INDEPENDENT_AMBULATORY_CARE_PROVIDER_SITE_OTHER): Payer: 59 | Admitting: Family Medicine

## 2023-03-19 ENCOUNTER — Encounter: Payer: Self-pay | Admitting: Family Medicine

## 2023-03-19 DIAGNOSIS — U071 COVID-19: Secondary | ICD-10-CM

## 2023-03-19 MED ORDER — NIRMATRELVIR/RITONAVIR (PAXLOVID)TABLET
3.0000 | ORAL_TABLET | Freq: Two times a day (BID) | ORAL | 0 refills | Status: AC
Start: 2023-03-19 — End: 2023-03-24

## 2023-03-19 MED ORDER — BENZONATATE 200 MG PO CAPS
200.0000 mg | ORAL_CAPSULE | Freq: Three times a day (TID) | ORAL | 0 refills | Status: DC | PRN
Start: 2023-03-19 — End: 2023-04-02

## 2023-03-19 NOTE — Progress Notes (Signed)
    Virtual Visit via Video Note  I connected with Jamie [name] on 03/19/23 at  3:40 PM EDT by a video enabled telemedicine application and verified that I am speaking with the correct person using two identifiers.   I discussed the limitations of evaluation and management by telemedicine and the availability of in person appointments. The patient expressed understanding and agreed to proceed.  Patient location: at home Provider location: in office  Subjective:    CC:  No chief complaint on file.   HPI:   Sxs started Sunday evening.  unable to taste, she has taken 2 at home covid tests today they were both positive.  She did run a fever and even vomited a couple times when it first started.  She has no appetite.  No diarrhea.  Had a lot of head pressure.   She feels that the  congestion has moved into her chest.       Past medical history, Surgical history, Family history not pertinant except as noted below, Social history, Allergies, and medications have been entered into the medical record, reviewed, and corrections made.    Objective:    General: Speaking clearly in complete sentences without any shortness of breath.  Alert and oriented x3.  Normal judgment. No apparent acute distress.    Impression and Recommendations:    Problem List Items Addressed This Visit   None Visit Diagnoses     COVID-19    -  Primary   Relevant Medications   nirmatrelvir/ritonavir (PAXLOVID) 20 x 150 MG & 10 x 100MG  TABS   benzonatate (TESSALON) 200 MG capsule        No orders of the defined types were placed in this encounter.   Meds ordered this encounter  Medications   nirmatrelvir/ritonavir (PAXLOVID) 20 x 150 MG & 10 x 100MG  TABS    Sig: Take 3 tablets by mouth 2 (two) times daily for 5 days. (Take nirmatrelvir 150 mg two tablets twice daily for 5 days and ritonavir 100 mg one tablet twice daily for 5 days) Patient GFR is 80    Dispense:  30 tablet    Refill:  0    benzonatate (TESSALON) 200 MG capsule    Sig: Take 1 capsule (200 mg total) by mouth 3 (three) times daily as needed for cough.    Dispense:  30 capsule    Refill:  0     I discussed the assessment and treatment plan with the patient. The patient was provided an opportunity to ask questions and all were answered. The patient agreed with the plan and demonstrated an understanding of the instructions.   The patient was advised to call back or seek an in-person evaluation if the symptoms worsen or if the condition fails to improve as anticipated.  I spent 20 minutes on the day of the encounter to include pre-visit record review, face-to-face time with the patient and post visit ordering of test.   Nani Gasser, MD

## 2023-03-19 NOTE — Progress Notes (Signed)
Sxs started Sunday evening. Vit D unable to taste, she has taken 2 at home covid tests today they were both positive.   She feels that the  congestion has moved into her chest.

## 2023-03-19 NOTE — Telephone Encounter (Signed)
OK for letter saying ok to take carer of [facility]

## 2023-03-20 ENCOUNTER — Telehealth: Payer: 59 | Admitting: Family Medicine

## 2023-03-25 ENCOUNTER — Encounter: Payer: Self-pay | Admitting: Physician Assistant

## 2023-03-28 ENCOUNTER — Encounter: Payer: Self-pay | Admitting: Physician Assistant

## 2023-03-29 ENCOUNTER — Encounter: Payer: Self-pay | Admitting: Physician Assistant

## 2023-04-01 NOTE — Progress Notes (Unsigned)
Follow Up Note  RE: Jamie [name] MRN: [id number] DOB: [birth year] Date of Office Visit: 04/02/2023  Referring provider: Nolene Ebbs Primary care provider: Jomarie Longs, PA-C  Chief Complaint: No chief complaint on file.  History of Present Illness: I had the pleasure of seeing Jamie [name] for a follow up visit at the [facility] on 04/01/2023. She is a 72 y.o. female, who is being followed for adverse food reaction, chronic rhinitis, multiple drug allergies and history of anaphylaxis. Her previous allergy office visit was on 10/24/2022 with Dr. Selena Batten. Today is a skin testing and follow up visit.  Adverse food reaction Avoiding multiple foods for the past 3 years and interested in reintroduction. She is also wanting to take an over the counter spirulina algae supplement but hesitant due to her past reactions to various foods and medications. She had bloodwork done in 2021 which was essentially negative.  Unable to skin test today as [facility] won't allow new patient visits and procedures on the same day.  Discussed with patient that there is no skin testing to spirulina and algae available in our office. Best thing is to bring the supplement and to skin test to the supplements and if negative do an in office drug challenge. Patient interested in this option and will schedule challenge.  Avoid foods that caused issues in the past. Plan on skin testing in the future and if negative will do bloodwork. If bloodwork is negative then will schedule for in office food challenges.  Discussed in detail with patient that all 3 steps need to be completed to rule out food allergies given her clinical history.  For mild symptoms you can take over the counter antihistamines such as Benadryl and monitor symptoms closely. If symptoms worsen or if you have severe symptoms including breathing issues, throat closure, significant swelling, whole body hives, severe diarrhea and vomiting,  lightheadedness then inject epinephrine and seek immediate medical care afterwards. Action plan given.   Chronic rhinitis Perennial rhinitis symptoms. No prior environmental testing. Unable to skin test today as [facility] won't allow new patient visits and procedures on the same day.  Return for environmental panel skin prick testing. 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 Will opt for bloodwork of skin prick testing does not show significant positives.  Will make additional recommendations based on results.   Multiple drug allergies Continue to avoid items of drug allergy list.   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 Monitor symptoms. For mild symptoms you can take over the counter antihistamines such as Benadryl and monitor symptoms closely. If symptoms worsen or if you have severe symptoms including breathing issues, throat closure, significant swelling, whole body hives, severe diarrhea and vomiting, lightheadedness then inject epinephrine and seek immediate medical care afterwards. Action plan in place.      Return for Drug challenge. Patient decided at check out that she wants to do skin prick testing first to the environmental and foods.   Assessment and Plan: Jamie [name] is a 72 y.o. female with: ***  No follow-ups on file.  No orders of the defined types were placed in this encounter.  Lab Orders  No laboratory test(s) ordered today    Diagnostics: Spirometry:  Tracings reviewed. Her effort: {Blank single:19197::"Good reproducible efforts.","It was hard to get consistent efforts and there is a question as to whether this reflects a maximal maneuver.","Poor effort, data can not be  interpreted."} FVC: ***L FEV1: ***L, ***% predicted FEV1/FVC ratio: ***% Interpretation: {Blank single:19197::"Spirometry consistent with mild obstructive disease","Spirometry consistent with moderate obstructive  disease","Spirometry consistent with severe obstructive disease","Spirometry consistent with possible restrictive disease","Spirometry consistent with mixed obstructive and restrictive disease","Spirometry uninterpretable due to technique","Spirometry consistent with normal pattern","No overt abnormalities noted given today's efforts"}.  Please see scanned spirometry results for details.  Skin Testing: {Blank single:19197::"Select foods","Environmental allergy panel","Environmental allergy panel and select foods","Food allergy panel","None","Deferred due to recent antihistamines use"}. *** Results discussed with patient/family.   Medication List:  Current Outpatient Medications  Medication Sig Dispense Refill  . albuterol (PROVENTIL HFA) 108 (90 Base) MCG/ACT inhaler Inhale 2 puffs into the lungs every 6 (six) hours as needed for wheezing or shortness of breath. 18 g 0  . ALPRAZolam (XANAX) 0.5 MG tablet TAKE ONE TABLET BY MOUTH TWICE A DAY AS NEEDED FOR SLEEP 60 tablet 1  . ASPIRIN LOW DOSE 81 MG tablet Take 81 mg by mouth daily.    Marland Kitchen b complex vitamins capsule Take 1 capsule by mouth daily.    . benzonatate (TESSALON) 200 MG capsule Take 1 capsule (200 mg total) by mouth 3 (three) times daily as needed for cough. 30 capsule 0  . Blood Glucose Monitoring Suppl (ACCU-CHEK GUIDE ME) w/Device KIT Dx R73.09 Elevated glucose - Check blood sugar 4 times daily. 1 kit prn  . Blood Glucose Monitoring Suppl (ONE TOUCH ULTRA MINI) w/Device KIT Check blood sugar twice daily. 1 kit 0  . cholecalciferol (VITAMIN D3) 25 MCG (1000 UNIT) tablet Take 1,000 Units by mouth daily.    Marland Kitchen conjugated estrogens (PREMARIN) vaginal cream APPLY VAGINALLY 3 TIMES A WEEK AT NIGHT 60 g 1  . EPINEPHrine 0.3 mg/0.3 mL IJ SOAJ injection Inject 0.3 mg into the muscle as needed for anaphylaxis. 1 each 0  . gabapentin (NEURONTIN) 100 MG capsule Take 1 capsule (100 mg total) by mouth 3 (three) times daily. 90 capsule 1  . glucose  blood (ONETOUCH ULTRA) test strip Check blood sugar twice daily. 100 each 12  . Lancets 30G MISC Check blood sugar twice daily. 100 each 12  . linaclotide (LINZESS) 72 MCG capsule Take 1 capsule (72 mcg total) by mouth daily before breakfast. 90 capsule 0  . OVER THE COUNTER MEDICATION Take 2 capsules by mouth daily. Friendly four    . pantoprazole (PROTONIX) 40 MG tablet Take 1 tablet (40 mg total) by mouth in the morning and at bedtime. As needed 180 tablet 1  . promethazine (PHENERGAN) 25 MG tablet Take 1 tablet (25 mg total) by mouth every 6 (six) hours as needed for nausea or vomiting. 30 tablet 0   Current Facility-Administered Medications  Medication Dose Route Frequency Provider Last Rate Last Admin  . cyanocobalamin (VITAMIN B12) injection 1,000 mcg  1,000 mcg Intramuscular Once Breeback, [name] L, PA-C       Allergies: Allergies  Allergen Reactions  . Bactrim [Sulfamethoxazole-Trimethoprim] Anaphylaxis  . Dairycare [Lactase-Lactobacillus] Anaphylaxis  . Ciprofloxacin     Facial numbness  . Iodine Rash  . Iodinated Contrast Media Other (See Comments)    Face and chest felt "hot", throat felt tight.   . Levaquin [Levofloxacin]     rash  . Macrobid [Nitrofurantoin]     Increased BP/kidney function decline  . Peanut Butter Flavor Nausea And Vomiting    N/V  . Shellfish Allergy Swelling   I reviewed her past medical history, social history, family history, and environmental history and no significant changes have  been reported from her previous visit.  Review of Systems  Constitutional:  Negative for appetite change, chills, fever and unexpected weight change.  HENT:  Negative for congestion and rhinorrhea.   Eyes:  Negative for itching.  Respiratory:  Negative for cough, chest tightness, shortness of breath and wheezing.   Cardiovascular:  Negative for chest pain.  Gastrointestinal:  Positive for constipation. Negative for abdominal pain, diarrhea, nausea and vomiting.   Genitourinary:  Negative for difficulty urinating.  Skin:  Negative for rash.       Scalp erythema  Neurological:  Positive for headaches.   Objective: There were no vitals taken for this visit. There is no height or weight on file to calculate BMI. Physical Exam Vitals and nursing note reviewed.  Constitutional:      Appearance: Normal appearance. She is well-developed.  HENT:     Head: Normocephalic and atraumatic.     Right Ear: Tympanic membrane and external ear normal.     Left Ear: Tympanic membrane and external ear normal.     Nose: Nose normal.     Mouth/Throat:     Mouth: Mucous membranes are moist.     Pharynx: Oropharynx is clear.  Eyes:     Conjunctiva/sclera: Conjunctivae normal.  Cardiovascular:     Rate and Rhythm: Normal rate and regular rhythm.     Heart sounds: Normal heart sounds. No murmur heard.    No friction rub. No gallop.  Pulmonary:     Effort: Pulmonary effort is normal.     Breath sounds: Normal breath sounds. No wheezing, rhonchi or rales.  Musculoskeletal:     Cervical back: Neck supple.  Skin:    General: Skin is warm.     Findings: No rash.  Neurological:     Mental Status: She is alert and oriented to person, place, and time.  Previous notes and tests were reviewed. The plan was reviewed with the patient/family, and all questions/concerned were addressed.  It was my pleasure to see Jamie [name] today and participate in her care. Please feel free to contact me with any questions or concerns.  Sincerely,  Wyline Mood, DO Allergy & Immunology  [facility] office: [phone] [facility] office: [phone]

## 2023-04-02 ENCOUNTER — Encounter: Payer: Self-pay | Admitting: Allergy

## 2023-04-02 ENCOUNTER — Ambulatory Visit (INDEPENDENT_AMBULATORY_CARE_PROVIDER_SITE_OTHER): Payer: 59 | Admitting: Allergy

## 2023-04-02 VITALS — BP 112/68 | HR 83 | Temp 97.9°F | Resp 16 | Wt 137.5 lb

## 2023-04-02 DIAGNOSIS — Z87892 Personal history of anaphylaxis: Secondary | ICD-10-CM | POA: Diagnosis not present

## 2023-04-02 DIAGNOSIS — T781XXD Other adverse food reactions, not elsewhere classified, subsequent encounter: Secondary | ICD-10-CM | POA: Diagnosis not present

## 2023-04-02 DIAGNOSIS — Z889 Allergy status to unspecified drugs, medicaments and biological substances status: Secondary | ICD-10-CM

## 2023-04-02 DIAGNOSIS — J3089 Other allergic rhinitis: Secondary | ICD-10-CM | POA: Diagnosis not present

## 2023-04-02 DIAGNOSIS — J310 Chronic rhinitis: Secondary | ICD-10-CM

## 2023-04-02 MED ORDER — EPINEPHRINE 0.3 MG/0.3ML IJ SOAJ: 0.3000 mg | INTRAMUSCULAR | 1 refills | Status: AC | PRN

## 2023-04-02 NOTE — Patient Instructions (Addendum)
Today's skin testing Negative to milk, casein and borderline to egg.  Food Avoid foods that are bothersome - egg, dairy, peanuts, seafood, ginger, sesame and soy.  Get bloodwork and if favorable will schedule for in office food challenges next.  I have refilled epinephrine injectable device. For mild symptoms you can take over the counter antihistamines such as Benadryl 1-2 tablets = 25-50mg  and monitor symptoms closely. If symptoms worsen or if you have severe symptoms including breathing issues, throat closure, significant swelling, whole body hives, severe diarrhea and vomiting, lightheadedness then inject epinephrine and seek immediate medical care afterwards. Emergency action plan given.  Environmental allergies Get bloodwork.  Drug/supplements Continue to avoid medications that gave you reactions in the past. Schedule for drug challenge to spirulina and algea (must be done on 2 separate days at least 1 week apart).  Drug challenge instructions: You must be off antihistamines for 3-5 days before. Must be in good health and not ill. No vaccines/injections/antibiotics within the past 7 days. Plan on being in the office for 2-3 hours and must bring in the drug you want to do the oral challenge for. You must call to schedule an appointment and specify it's for a drug challenge.   Follow up depending on bloodwork results.

## 2023-04-03 ENCOUNTER — Ambulatory Visit: Payer: 59 | Admitting: Pulmonary Disease

## 2023-04-09 ENCOUNTER — Other Ambulatory Visit: Payer: Self-pay

## 2023-04-09 DIAGNOSIS — T781XXD Other adverse food reactions, not elsewhere classified, subsequent encounter: Secondary | ICD-10-CM

## 2023-04-09 DIAGNOSIS — J3089 Other allergic rhinitis: Secondary | ICD-10-CM

## 2023-04-09 NOTE — Progress Notes (Signed)
Ordered labs per [employer].

## 2023-04-13 ENCOUNTER — Encounter: Payer: Self-pay | Admitting: Allergy

## 2023-04-13 LAB — ALLERGENS W/TOTAL IGE AREA 2

## 2023-04-13 LAB — ALLERGEN PROFILE, FOOD-FISH
Allergen Mackerel IgE: <0.1 kU/L
Allergen Salmon IgE: <0.1 kU/L
Allergen Trout IgE: <0.1 kU/L
Allergen Walley Pike IgE: <0.1 kU/L
Codfish IgE: <0.1 kU/L
Halibut IgE: <0.1 kU/L
Tuna: <0.1 kU/L

## 2023-04-13 LAB — ALLERGEN PROFILE, SHELLFISH
Clam IgE: <0.1 kU/L
F023-IgE Crab: <0.1 kU/L
F080-IgE Lobster: <0.1 kU/L
F290-IgE Oyster: <0.1 kU/L
Scallop IgE: <0.1 kU/L
Shrimp IgE: <0.1 kU/L

## 2023-04-13 LAB — IGE EGG WHITE W/COMPONENT RFLX: F001-IgE Egg White: <0.1 kU/L

## 2023-04-13 LAB — ALLERGEN SOYBEAN: Soybean IgE: <0.1 kU/L

## 2023-04-13 LAB — ALLERGEN, GINGER, RF270: Allergen Ginger IgE: <0.1 kU/L

## 2023-04-13 LAB — IGE PEANUT W/COMPONENT REFLEX: Peanut, IgE: <0.1 kU/L

## 2023-04-13 LAB — ALLERGEN SESAME F10: Sesame Seed IgE: <0.1 kU/L

## 2023-04-13 LAB — IGE MILK W/ COMPONENT REFLEX: F002-IgE Milk: <0.1 kU/L

## 2023-04-15 ENCOUNTER — Encounter: Payer: Self-pay | Admitting: Physician Assistant

## 2023-04-15 DIAGNOSIS — T7840XD Allergy, unspecified, subsequent encounter: Secondary | ICD-10-CM

## 2023-04-15 NOTE — Progress Notes (Signed)
No IGE response to any of testing. Please fax copy to allergist as well.

## 2023-04-17 ENCOUNTER — Ambulatory Visit: Payer: 59 | Admitting: Allergy

## 2023-04-30 ENCOUNTER — Encounter: Payer: Self-pay | Admitting: Physician Assistant

## 2023-05-06 ENCOUNTER — Other Ambulatory Visit: Payer: Self-pay | Admitting: Family Medicine

## 2023-05-06 DIAGNOSIS — F419 Anxiety disorder, unspecified: Secondary | ICD-10-CM

## 2023-05-28 ENCOUNTER — Encounter: Payer: Self-pay | Admitting: Pulmonary Disease

## 2023-05-28 ENCOUNTER — Ambulatory Visit (INDEPENDENT_AMBULATORY_CARE_PROVIDER_SITE_OTHER): Payer: 59 | Admitting: Pulmonary Disease

## 2023-05-28 VITALS — BP 130/80 | HR 75 | Ht 64.5 in | Wt 148.0 lb

## 2023-05-28 DIAGNOSIS — R9389 Abnormal findings on diagnostic imaging of other specified body structures: Secondary | ICD-10-CM | POA: Diagnosis not present

## 2023-05-28 NOTE — Progress Notes (Deleted)
Jamie [name]    [id number]    05-31-1951  Primary Care Physician:Breeback, Lonna Cobb, PA-C  Referring Physician: Jomarie Longs, PA-C [address] [place],  [place] [address]  Chief complaint:  ***  HPI:  ***  Pets: Occupation: Exposures: Smoking history: Travel history: Relevant family history:  Outpatient Encounter Medications as of 05/28/2023  Medication Sig   ALPRAZolam (XANAX) 0.5 MG tablet TAKE ONE TABLET BY MOUTH TWICE A DAY AS NEEDED FOR SLEEP   ASPIRIN LOW DOSE 81 MG tablet Take 81 mg by mouth daily.   b complex vitamins capsule Take 1 capsule by mouth daily.   Blood Glucose Monitoring Suppl (ACCU-CHEK GUIDE ME) w/Device KIT Dx R73.09 Elevated glucose - Check blood sugar 4 times daily.   Blood Glucose Monitoring Suppl (ONE TOUCH ULTRA MINI) w/Device KIT Check blood sugar twice daily.   cholecalciferol (VITAMIN D3) 25 MCG (1000 UNIT) tablet Take 1,000 Units by mouth daily.   conjugated estrogens (PREMARIN) vaginal cream APPLY VAGINALLY 3 TIMES A WEEK AT NIGHT   EPINEPHrine 0.3 mg/0.3 mL IJ SOAJ injection Inject 0.3 mg into the muscle as needed for anaphylaxis.   gabapentin (NEURONTIN) 100 MG capsule Take 1 capsule (100 mg total) by mouth 3 (three) times daily.   glucose blood (ONETOUCH ULTRA) test strip Check blood sugar twice daily.   Lancets 30G MISC Check blood sugar twice daily.   OVER THE COUNTER MEDICATION Take 2 capsules by mouth daily. Friendly four   pantoprazole (PROTONIX) 40 MG tablet Take 1 tablet (40 mg total) by mouth in the morning and at bedtime. As needed   Facility-Administered Encounter Medications as of 05/28/2023  Medication   cyanocobalamin (VITAMIN B12) injection 1,000 mcg    Allergies as of 05/28/2023 - Review Complete 05/28/2023  Allergen Reaction Noted   Bactrim [sulfamethoxazole-trimethoprim] Anaphylaxis 10/14/2021   Dairycare [lactase-lactobacillus] Anaphylaxis 08/21/2021   Ciprofloxacin  01/31/2012    Iodine Rash 05/15/2017   Iodinated contrast media Other (See Comments) 11/13/2016   Levaquin [levofloxacin]  10/04/2022   Macrobid [nitrofurantoin]  11/20/2021   Peanut butter flavor Nausea And Vomiting 02/13/2012   Shellfish allergy Swelling 10/01/2011    Past Medical History:  Diagnosis Date   Abdominal bloating 08/23/2017   Abnormal finding on urinalysis 02/27/2022   Last Assessment & Plan:  Formatting of this note might be different from the original. 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   Antibiotic-resistant bacterial infection 11/20/2021   Anxiety    Anxiety about health 08/23/2017   ANXIETY DEPRESSION 06/09/2010   Qualifier: Diagnosis of  By: Peggyann Juba FNP, Melissa S    Asymptomatic bacteriuria 06/11/2022   Atypical chest pain 08/23/2017   B12 deficiency 06/13/2015   Bacteriuria 11/29/2021   Biceps tendonitis on right 04/13/2022   Bilateral headaches    Bilateral tinnitus 12/23/2020   Cardiovascular risk factor 05/08/2017   Carotid stenosis, asymptomatic, bilateral 05/08/2017  Carotid dopplers showed bilateral ICA stenosis 1-39 percent 2016 Minor carotid intimal thickening without significant atherosclerosis. No hemodynamically significant ICA stenosis. Degree of narrowing less than 50% bilaterally. 08/2015  1. Mild (1-49%) stenosis proximal right internal carotid artery secondary to heterogenous atherosclerotic plaque. 2. Mild (1-49%) stenosis  proximal left internal car   Carpal tunnel syndrome 10/17/2015   Cataract    bilateral   Cervical radiculopathy 10/01/2018   Xray 09/2018: cervical DDD, disc height loss at C5 and C6.    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   Constipation 07/19/2015   DDD (degenerative disc disease), cervical 08/31/2015   Depression with anxiety 06/18/2015   Diverticulitis of large intestine with abscess without bleeding    Diverticulosis    Diverticulosis of large intestine 07/03/2011   Dyspnea    Elevated random blood glucose level 11/29/2021   Epigastric pain 07/06/2019   ETD (Eustachian tube dysfunction), right 06/11/2018   Exposure to chemical inhalation 11/06/2016   Exposure to hepatitis C 06/21/2021   Exposure to TB 06/21/2021   Extrinsic asthma, unspecified    Family history of renal cancer 07/20/2015   Fatigue 01/15/2018   Former smoker 05/14/2016   GAD (generalized anxiety disorder) 10/12/2019   Gastritis and duodenitis 04/15/2017   Generalized abdominal pain 01/24/2014   Genitourinary syndrome of menopause 02/27/2022   Formatting of this note might be different from the original. On premarin cream  Last Assessment & Plan:  Formatting of this note might be different from the original. - continue premarin cream 2-3x per week   GERD (gastroesophageal reflux disease)    Hair loss 07/20/2015   Heel pain, bilateral 01/15/2018   Hematuria 01/25/2017   Herpes zoster without complication 06/02/2021   History of excessive cerumen 05/08/2017   History of shingles 07/19/2015   Hives of unknown origin 10/12/2019   Hx of diverticulitis of colon 01/25/2017   Hyperlipidemia    Hyperpigmentation of skin 07/19/2015   Hypopigmentation 01/11/2016   IBS (irritable bowel syndrome)    Impacted cerumen, bilateral 02/09/2020   Iron deficiency anemia secondary to inadequate dietary iron intake 08/23/2017   Left arm numbness 10/13/2015   Left ear impacted cerumen  12/23/2020   Leg cramping 12/23/2020   Low iron stores 07/19/2015   Lung mass    Mid back pain on left side 07/20/2015   Migraine with aura and with status migrainosus, not intractable    Moderate protein-calorie malnutrition (HCC) 10/07/2019   Multiple food allergies 01/11/2016   Muscle strain of upper back 01/15/2018   Nausea 01/29/2012   Neck pain 10/13/2015   Neuropathy 02/02/2020   Normocytic anemia 06/18/2015   Numbness and tingling of right side of face 12/31/2018   OAB (overactive bladder) 09/21/2019   ONYCHOMYCOSIS, TOENAILS 06/09/2010   Qualifier: Diagnosis of  By: Peggyann Juba FNP, Melissa S    Osteopenia 01/20/2014   Osteoporosis 12/14/2020   -2.8. discussed starting medication.    Pelvic floor tension 02/27/2022   Formatting of this note might be different from the original. 02/27/2022 very tender on exam, particularly on the right  Last Assessment & Plan:  Formatting of this note might be different from the original. We discussed the pathophysiology of levator spasm, which can be triggered by any painful or traumatic pelvic episode, such as a severe UTI, pelvic surgery, sexual abuse, or chronic conditions.    Pelvic floor weakness 09/16/2017   Pernicious anemia 10/13/2015   Pneumaturia 02/27/2022  Pneumonia    Post herpetic neuralgia 06/21/2021   Pre-diabetes    Rectocele 02/27/2022   Formatting of this note might be different from the original. 02/27/2022  Describes a bulge or pocket in her vagina, has to splint for BM. Worsened by bearing down.    Last Assessment & Plan:  Formatting of this note might be different from the original. No evidence of prolapse/rectocele on exam today. CTM, meanwhile advised pt to avoid splinting, optimize diet, and f/u with pelvic floor PT   Recurrent infections 09/25/2019   Recurrent urinary tract infection 01/29/2012   Right knee pain 11/05/2016   Right-sided headache    S/P laparoscopic-assisted sigmoidectomy 09/08/2021   Seasonal allergic rhinitis due to pollen  09/25/2019   Seasonal and perennial allergic rhinitis 11/06/2011   Allergy skin test 02/13/2012: Mild to moderate reactions mainly for grass, weed, tree pollens, house dust/mite Allergy profile 12/20/2011: Total IgE 33.5. No specific elevations including shrimp.   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 treatment plan  repeat scan in one year and then if stable at that point we'll repeat every 2-3 years for monitoring.   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   Thyroid disease    Unintentional weight loss 07/22/2020   Urinary frequency 06/13/2018   Vaginal dryness 01/11/2016   Vaginal irritation 09/15/2018   Viral respiratory infection 12/04/2019   Vision changes 01/25/2017   Vitamin D deficiency 08/23/2017    Past Surgical History:  Procedure Laterality Date   ABDOMINAL HYSTERECTOMY     COLONOSCOPY     CYSTOSCOPY WITH STENT PLACEMENT Bilateral 09/08/2021   Procedure: CYSTOSCOPY WITH STENT PLACEMENT;  Surgeon: Bjorn Pippin, MD;  Location: [facility];  Service: Urology;  Laterality: Bilateral;   FLEXIBLE SIGMOIDOSCOPY N/A 09/08/2021   Procedure: FLEXIBLE SIGMOIDOSCOPY;  Surgeon: Andria Meuse, MD;  Location: [facility];  Service: General;  Laterality: N/A;   HEMORRHOID SURGERY     LYSIS OF ADHESION N/A 09/08/2021   Procedure: LYSIS OF ADHESION;  Surgeon: Andria Meuse, MD;  Location: [facility];  Service: General;  Laterality: N/A;   TUBAL LIGATION     TUMOR REMOVAL     Left thigh     Family History  Problem Relation Age of Onset   Diabetes Mother    Liver cancer Mother        renal cell    Heart disease Father        first MI in 67s   Cervical cancer Sister    Allergic rhinitis Sister    Asthma Sister    Heart disease Brother        first MI in 54s   Alcohol abuse Sister    Fibromyalgia Sister     Pancreatic cancer Sister 33   Arthritis Sister        osteoarthritis   Emphysema Sister    Irritable bowel syndrome Sister    Emphysema Sister    Leukemia Other        grandson   Urticaria Daughter    Allergic rhinitis Daughter    Stomach cancer Neg Hx    Colon cancer Neg Hx    Angioedema Neg Hx    Eczema Neg Hx    Immunodeficiency Neg Hx    Colon polyps Neg Hx    Esophageal cancer Neg  Hx    Rectal cancer Neg Hx     Social History   Socioeconomic History   Marital status: Single    Spouse name: Not on file   Number of children: 3   Years of education: 14   Highest education level: Associate degree: academic program  Occupational History   Occupation: Consulting civil engineer   Occupation: retired.  Tobacco Use   Smoking status: Former    Current packs/day: 0.00    Average packs/day: 0.8 packs/day for 20.0 years (16.0 ttl pk-yrs)    Types: Cigarettes    Start date: 08/13/1965    Quit date: 08/13/1985    Years since quitting: 37.8    Passive exposure: Current (once a week)   Smokeless tobacco: Never  Vaping Use   Vaping status: Never Used  Substance and Sexual Activity   Alcohol use: Yes    Comment: rare   Drug use: No   Sexual activity: Not Currently    Partners: Male    Birth control/protection: None  Other Topics Concern   Not on file  Social History Narrative   Her daughter is staying with her since her surgery. She enjoys shopping, exercise (when she can) and enjoys word finder.    Social Determinants of Health   Financial Resource Strain: Low Risk  (10/16/2022)   Overall Financial Resource Strain (CARDIA)    Difficulty of Paying Living Expenses: Not hard at all  Food Insecurity: No Food Insecurity (10/16/2022)   Hunger Vital Sign    Worried About Running Out of Food in the Last Year: Never true    Ran Out of Food in the Last Year: Never true  Transportation Needs: No Transportation Needs (10/16/2022)   PRAPARE - Administrator, Civil Service (Medical): No     Lack of Transportation (Non-Medical): No  Physical Activity: Insufficiently Active (10/16/2022)   Exercise Vital Sign    Days of Exercise per Week: 1 day    Minutes of Exercise per Session: 30 min  Stress: No Stress Concern Present (10/16/2022)   [employer] of Occupational Health - Occupational Stress Questionnaire    Feeling of Stress : Only a little  Social Connections: Unknown (10/16/2022)   Social Connection and Isolation Panel [NHANES]    Frequency of Communication with Friends and Family: More than three times a week    Frequency of Social Gatherings with Friends and Family: Never    Attends Religious Services: More than 4 times per year    Active Member of [employer] or Organizations: No    Attends Banker Meetings: Never    Marital Status: Patient declined  Catering manager Violence: Not At Risk (10/16/2022)   Humiliation, Afraid, Rape, and Kick questionnaire    Fear of Current or Ex-Partner: No    Emotionally Abused: No    Physically Abused: No    Sexually Abused: No    Review of Systems  Vitals:   05/28/23 1331  BP: 130/80  Pulse: 75  SpO2: 99%     Physical Exam   Data Reviewed: ***  Assessment:  ***  Plan/Recommendations: ***   Virl Diamond MD [facility] 05/28/2023, 1:37 PM  CC: Jomarie Longs, PA-C

## 2023-05-28 NOTE — Progress Notes (Unsigned)
Jamie [name]    [id number]    1951-05-22  Primary Care Physician:Breeback, Lonna Cobb, PA-C  Referring Physician: Jomarie Longs, PA-C [address] [place],  [place] [address]  Chief complaint:   Referred for spot on her lung, was told she has COPD  HPI:  Was recently told about a spot in the lung Had an abdominal CT showing a left lower lobe nodule  Looking back at old CTs, nodule has been there  Last time she was in the office was noted to have a nodule in the fissure on the right for which a follow-up CT was planned  Breathing has been stable Denies any significant shortness of breath  She quit smoking over 30 years ago, only smoked socially Worked in the furniture industry  Denies any significant concerns today  PFT previously with mild obstruction  She did have a cardiac CT showing nodules less than 3 mm  She was very active with no limitations prior to the surgery and urinary tract infection which she has been dealing with since January  Outpatient Encounter Medications as of 05/28/2023  Medication Sig   ALPRAZolam (XANAX) 0.5 MG tablet TAKE ONE TABLET BY MOUTH TWICE A DAY AS NEEDED FOR SLEEP   ASPIRIN LOW DOSE 81 MG tablet Take 81 mg by mouth daily.   b complex vitamins capsule Take 1 capsule by mouth daily.   Blood Glucose Monitoring Suppl (ACCU-CHEK GUIDE ME) w/Device KIT Dx R73.09 Elevated glucose - Check blood sugar 4 times daily.   Blood Glucose Monitoring Suppl (ONE TOUCH ULTRA MINI) w/Device KIT Check blood sugar twice daily.   cholecalciferol (VITAMIN D3) 25 MCG (1000 UNIT) tablet Take 1,000 Units by mouth daily.   conjugated estrogens (PREMARIN) vaginal cream APPLY VAGINALLY 3 TIMES A WEEK AT NIGHT   EPINEPHrine 0.3 mg/0.3 mL IJ SOAJ injection Inject 0.3 mg into the muscle as needed for anaphylaxis.   gabapentin (NEURONTIN) 100 MG capsule Take 1 capsule (100 mg total) by mouth 3 (three) times daily.   glucose blood (ONETOUCH  ULTRA) test strip Check blood sugar twice daily.   Lancets 30G MISC Check blood sugar twice daily.   OVER THE COUNTER MEDICATION Take 2 capsules by mouth daily. Friendly four   pantoprazole (PROTONIX) 40 MG tablet Take 1 tablet (40 mg total) by mouth in the morning and at bedtime. As needed   Facility-Administered Encounter Medications as of 05/28/2023  Medication   cyanocobalamin (VITAMIN B12) injection 1,000 mcg    Allergies as of 05/28/2023 - Review Complete 05/28/2023  Allergen Reaction Noted   Bactrim [sulfamethoxazole-trimethoprim] Anaphylaxis 10/14/2021   Dairycare [lactase-lactobacillus] Anaphylaxis 08/21/2021   Ciprofloxacin  01/31/2012   Iodine Rash 05/15/2017   Iodinated contrast media Other (See Comments) 11/13/2016   Levaquin [levofloxacin]  10/04/2022   Macrobid [nitrofurantoin]  11/20/2021   Peanut butter flavor Nausea And Vomiting 02/13/2012   Shellfish allergy Swelling 10/01/2011    Past Medical History:  Diagnosis Date   Abdominal bloating 08/23/2017   Abnormal finding on urinalysis 02/27/2022   Last Assessment & Plan:  Formatting of this note might be different from the original. 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   Antibiotic-resistant bacterial infection 11/20/2021   Anxiety    Anxiety about health 08/23/2017   ANXIETY DEPRESSION 06/09/2010   Qualifier: Diagnosis of  By: Peggyann Juba FNP, Melissa S    Asymptomatic bacteriuria  06/11/2022   Atypical chest pain 08/23/2017   B12 deficiency 06/13/2015   Bacteriuria 11/29/2021   Biceps tendonitis on right 04/13/2022   Bilateral headaches    Bilateral tinnitus 12/23/2020   Cardiovascular risk factor 05/08/2017   Carotid stenosis, asymptomatic, bilateral 05/08/2017   Carotid dopplers showed bilateral ICA stenosis 1-39 percent 2016 Minor carotid intimal thickening without significant atherosclerosis. No hemodynamically significant ICA stenosis. Degree of narrowing less than 50% bilaterally. 08/2015  1. Mild (1-49%) stenosis proximal right internal carotid artery secondary to heterogenous atherosclerotic plaque. 2. Mild (1-49%) stenosis proximal left internal car   Carpal tunnel syndrome 10/17/2015   Cataract    bilateral   Cervical radiculopathy 10/01/2018   Xray 09/2018: cervical DDD, disc height loss at C5 and C6.    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   Constipation 07/19/2015   DDD (degenerative disc disease), cervical 08/31/2015   Depression with anxiety 06/18/2015   Diverticulitis of large intestine with abscess without bleeding    Diverticulosis    Diverticulosis of large intestine 07/03/2011   Dyspnea    Elevated random blood glucose level 11/29/2021   Epigastric pain 07/06/2019   ETD (Eustachian tube dysfunction), right 06/11/2018   Exposure to chemical inhalation 11/06/2016   Exposure to hepatitis C 06/21/2021   Exposure to TB 06/21/2021   Extrinsic asthma, unspecified    Family history of renal cancer 07/20/2015   Fatigue 01/15/2018   Former smoker 05/14/2016   GAD (generalized anxiety disorder) 10/12/2019   Gastritis and duodenitis 04/15/2017   Generalized abdominal pain 01/24/2014   Genitourinary syndrome of menopause 02/27/2022   Formatting of this note might be different from the original. On premarin cream  Last Assessment & Plan:  Formatting of this note might be different from the original. - continue  premarin cream 2-3x per week   GERD (gastroesophageal reflux disease)    Hair loss 07/20/2015   Heel pain, bilateral 01/15/2018   Hematuria 01/25/2017   Herpes zoster without complication 06/02/2021   History of excessive cerumen 05/08/2017   History of shingles 07/19/2015   Hives of unknown origin 10/12/2019   Hx of diverticulitis of colon 01/25/2017   Hyperlipidemia    Hyperpigmentation of skin 07/19/2015   Hypopigmentation 01/11/2016   IBS (irritable bowel syndrome)    Impacted cerumen, bilateral 02/09/2020   Iron deficiency anemia secondary to inadequate dietary iron intake 08/23/2017   Left arm numbness 10/13/2015   Left ear impacted cerumen 12/23/2020   Leg cramping 12/23/2020   Low iron stores 07/19/2015   Lung mass    Mid back pain on left side 07/20/2015   Migraine with aura and with status migrainosus, not intractable    Moderate protein-calorie malnutrition (HCC) 10/07/2019   Multiple food allergies 01/11/2016   Muscle strain of upper back 01/15/2018   Nausea 01/29/2012   Neck pain 10/13/2015   Neuropathy 02/02/2020   Normocytic anemia 06/18/2015   Numbness and tingling  of right side of face 12/31/2018   OAB (overactive bladder) 09/21/2019   ONYCHOMYCOSIS, TOENAILS 06/09/2010   Qualifier: Diagnosis of  By: Peggyann Juba FNP, Melissa S    Osteopenia 01/20/2014   Osteoporosis 12/14/2020   -2.8. discussed starting medication.    Pelvic floor tension 02/27/2022   Formatting of this note might be different from the original. 02/27/2022 very tender on exam, particularly on the right  Last Assessment & Plan:  Formatting of this note might be different from the original. We discussed the pathophysiology of levator spasm, which can be triggered by any painful or traumatic pelvic episode, such as a severe UTI, pelvic surgery, sexual abuse, or chronic conditions.    Pelvic floor weakness 09/16/2017   Pernicious anemia 10/13/2015   Pneumaturia 02/27/2022   Pneumonia    Post herpetic neuralgia 06/21/2021    Pre-diabetes    Rectocele 02/27/2022   Formatting of this note might be different from the original. 02/27/2022  Describes a bulge or pocket in her vagina, has to splint for BM. Worsened by bearing down.    Last Assessment & Plan:  Formatting of this note might be different from the original. No evidence of prolapse/rectocele on exam today. CTM, meanwhile advised pt to avoid splinting, optimize diet, and f/u with pelvic floor PT   Recurrent infections 09/25/2019   Recurrent urinary tract infection 01/29/2012   Right knee pain 11/05/2016   Right-sided headache    S/P laparoscopic-assisted sigmoidectomy 09/08/2021   Seasonal allergic rhinitis due to pollen 09/25/2019   Seasonal and perennial allergic rhinitis 11/06/2011   Allergy skin test 02/13/2012: Mild to moderate reactions mainly for grass, weed, tree pollens, house dust/mite Allergy profile 12/20/2011: Total IgE 33.5. No specific elevations including shrimp.   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 treatment plan  repeat scan in one year and then if stable at that point we'll repeat every 2-3 years for monitoring.   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   Thyroid disease    Unintentional weight loss 07/22/2020   Urinary frequency 06/13/2018   Vaginal dryness 01/11/2016   Vaginal irritation 09/15/2018   Viral respiratory infection 12/04/2019   Vision changes 01/25/2017   Vitamin D deficiency 08/23/2017    Past Surgical History:  Procedure Laterality Date   ABDOMINAL HYSTERECTOMY     COLONOSCOPY     CYSTOSCOPY WITH STENT PLACEMENT Bilateral 09/08/2021   Procedure: CYSTOSCOPY WITH STENT PLACEMENT;  Surgeon: Bjorn Pippin, MD;  Location: [facility];  Service: Urology;  Laterality: Bilateral;   FLEXIBLE SIGMOIDOSCOPY N/A 09/08/2021   Procedure: FLEXIBLE SIGMOIDOSCOPY;   Surgeon: Andria Meuse, MD;  Location: [facility];  Service: General;  Laterality: N/A;   HEMORRHOID SURGERY     LYSIS OF ADHESION N/A 09/08/2021   Procedure: LYSIS OF ADHESION;  Surgeon: Andria Meuse, MD;  Location: [facility];  Service: General;  Laterality: N/A;   TUBAL LIGATION     TUMOR REMOVAL     Left thigh     Family History  Problem Relation Age of Onset   Diabetes Mother    Liver cancer Mother        renal cell    Heart disease Father        first MI in 54s   Cervical cancer Sister    Allergic rhinitis Sister  Asthma Sister    Heart disease Brother        first MI in 49s   Alcohol abuse Sister    Fibromyalgia Sister    Pancreatic cancer Sister 33   Arthritis Sister        osteoarthritis   Emphysema Sister    Irritable bowel syndrome Sister    Emphysema Sister    Leukemia Other        grandson   Urticaria Daughter    Allergic rhinitis Daughter    Stomach cancer Neg Hx    Colon cancer Neg Hx    Angioedema Neg Hx    Eczema Neg Hx    Immunodeficiency Neg Hx    Colon polyps Neg Hx    Esophageal cancer Neg Hx    Rectal cancer Neg Hx     Social History   Socioeconomic History   Marital status: Single    Spouse name: Not on file   Number of children: 3   Years of education: 14   Highest education level: Associate degree: academic program  Occupational History   Occupation: Consulting civil engineer   Occupation: retired.  Tobacco Use   Smoking status: Former    Current packs/day: 0.00    Average packs/day: 0.8 packs/day for 20.0 years (16.0 ttl pk-yrs)    Types: Cigarettes    Start date: 08/13/1965    Quit date: 08/13/1985    Years since quitting: 37.8    Passive exposure: Current (once a week)   Smokeless tobacco: Never  Vaping Use   Vaping status: Never Used  Substance and Sexual Activity   Alcohol use: Yes    Comment: rare   Drug use: No   Sexual activity: Not Currently    Partners: Male    Birth control/protection: None  Other Topics Concern   Not on  file  Social History Narrative   Her daughter is staying with her since her surgery. She enjoys shopping, exercise (when she can) and enjoys word finder.    Social Determinants of Health   Financial Resource Strain: Low Risk  (10/16/2022)   Overall Financial Resource Strain (CARDIA)    Difficulty of Paying Living Expenses: Not hard at all  Food Insecurity: No Food Insecurity (10/16/2022)   Hunger Vital Sign    Worried About Running Out of Food in the Last Year: Never true    Ran Out of Food in the Last Year: Never true  Transportation Needs: No Transportation Needs (10/16/2022)   PRAPARE - Administrator, Civil Service (Medical): No    Lack of Transportation (Non-Medical): No  Physical Activity: Insufficiently Active (10/16/2022)   Exercise Vital Sign    Days of Exercise per Week: 1 day    Minutes of Exercise per Session: 30 min  Stress: No Stress Concern Present (10/16/2022)   [employer] of Occupational Health - Occupational Stress Questionnaire    Feeling of Stress : Only a little  Social Connections: Unknown (10/16/2022)   Social Connection and Isolation Panel [NHANES]    Frequency of Communication with Friends and Family: More than three times a week    Frequency of Social Gatherings with Friends and Family: Never    Attends Religious Services: More than 4 times per year    Active Member of [employer] or Organizations: No    Attends Banker Meetings: Never    Marital Status: Patient declined  Intimate Partner Violence: Not At Risk (10/16/2022)   Humiliation, Afraid, Rape, and Kick questionnaire  Fear of Current or Ex-Partner: No    Emotionally Abused: No    Physically Abused: No    Sexually Abused: No    Review of Systems  Respiratory:  Negative for shortness of breath.     Vitals:   05/28/23 1331  BP: 130/80  Pulse: 75  SpO2: 99%     Physical Exam Constitutional:      Appearance: Normal appearance.  HENT:     Head: Normocephalic.      Mouth/Throat:     Mouth: Mucous membranes are moist.  Eyes:     Pupils: Pupils are equal, round, and reactive to light.  Cardiovascular:     Rate and Rhythm: Normal rate and regular rhythm.     Heart sounds: No murmur heard. Pulmonary:     Effort: No respiratory distress.     Breath sounds: No stridor. No wheezing or rhonchi.  Musculoskeletal:     Cervical back: No rigidity or tenderness.  Neurological:     Mental Status: She is alert.  Psychiatric:        Mood and Affect: Mood normal.    Data Reviewed: PFT from 2019 did show mild obstructive disease  CT scan of the chest from 05/29/2021 reviewed showing centrilobular emphysema, fissural nodule on the right Cardiac CT films reviewed with the patient  Abdominal CT 02/11/2023 reviewed with the patient showing a nodule at the left base  Assessment:  Abnormal CT scan of the chest showing centrilobular emphysema, lung nodule Abdominal CT also does reveal a basal nodule on the left  PFT with obstructive disease  Breathing has been relatively stable -Denies any significant respiratory complaints    Plan/Recommendations: Will order for repeat CT scan of the chest  Has no respiratory complaints require any inhalers at present  Does have obstructive disease/emphysema on previous CT  Encouraged to call with significant concerns  Follow-up in 6 months   Virl Diamond MD [facility] 05/28/2023, 2:11 PM  CC: Jomarie Longs, PA-C

## 2023-05-28 NOTE — Patient Instructions (Signed)
CT scan of the chest to follow-up on lung nodules, right midlung around the fissure, left lower lobe nodule  Nodules appear to have been present from a scan from 2 years ago  Follow-up in about 6 months

## 2023-05-29 ENCOUNTER — Encounter: Payer: Self-pay | Admitting: Physician Assistant

## 2023-06-22 ENCOUNTER — Encounter: Payer: Self-pay | Admitting: Physician Assistant

## 2023-06-24 MED ORDER — ONETOUCH ULTRA MINI W/DEVICE KIT: PACK | 0 refills | Status: DC

## 2023-06-24 MED ORDER — ONETOUCH ULTRA VI STRP: ORAL_STRIP | 12 refills | Status: AC

## 2023-06-26 MED ORDER — CONTOUR NEXT LINK W/DEVICE KIT: PACK | 0 refills | Status: AC

## 2023-06-26 MED ORDER — CONTOUR NEXT TEST VI STRP: ORAL_STRIP | 12 refills | Status: AC

## 2023-07-01 ENCOUNTER — Encounter: Payer: Self-pay | Admitting: Physician Assistant

## 2023-07-01 DIAGNOSIS — F419 Anxiety disorder, unspecified: Secondary | ICD-10-CM

## 2023-07-02 MED ORDER — ALPRAZOLAM 0.5 MG PO TABS
ORAL_TABLET | ORAL | 1 refills | Status: DC
Start: 2023-07-02 — End: 2023-09-03

## 2023-07-18 ENCOUNTER — Encounter: Payer: Self-pay | Admitting: Physician Assistant

## 2023-07-30 ENCOUNTER — Ambulatory Visit: Payer: 59

## 2023-07-30 ENCOUNTER — Ambulatory Visit (HOSPITAL_BASED_OUTPATIENT_CLINIC_OR_DEPARTMENT_OTHER): Admission: RE | Admit: 2023-07-30 | Payer: 59 | Source: Ambulatory Visit

## 2023-07-30 ENCOUNTER — Encounter: Payer: Self-pay | Admitting: Physician Assistant

## 2023-07-30 DIAGNOSIS — R9389 Abnormal findings on diagnostic imaging of other specified body structures: Secondary | ICD-10-CM | POA: Diagnosis not present

## 2023-07-30 DIAGNOSIS — R918 Other nonspecific abnormal finding of lung field: Secondary | ICD-10-CM | POA: Diagnosis not present

## 2023-07-30 DIAGNOSIS — I700 Atherosclerosis of aorta: Secondary | ICD-10-CM | POA: Diagnosis not present

## 2023-08-08 ENCOUNTER — Ambulatory Visit: Payer: Self-pay | Admitting: *Deleted

## 2023-08-08 NOTE — Telephone Encounter (Signed)
Per agent: "Patient diverticulitis is active right now and needs antibiotics please. Thank you"   Unsure if sent to wrong triage, states [facility] [place].  Pt states she will call back in AM. After hours call.Marland Kitchen

## 2023-08-09 ENCOUNTER — Ambulatory Visit: Payer: 59 | Admitting: Physician Assistant

## 2023-08-09 ENCOUNTER — Telehealth: Payer: Self-pay

## 2023-08-09 NOTE — Telephone Encounter (Signed)
Copied from CRM [id number]. Topic: Appointments - Appointment Cancel/Reschedule >> Aug 08, 2023  4:40 PM Carloyn Manner C wrote: Patient/patient representative is calling to cancel or reschedule an appointment. Refer to attachments for appointment information.

## 2023-08-30 ENCOUNTER — Encounter: Payer: Self-pay | Admitting: Physician Assistant

## 2023-09-02 ENCOUNTER — Encounter: Payer: Self-pay | Admitting: Physician Assistant

## 2023-09-02 DIAGNOSIS — F419 Anxiety disorder, unspecified: Secondary | ICD-10-CM

## 2023-09-03 MED ORDER — ALPRAZOLAM 0.5 MG PO TABS: ORAL_TABLET | ORAL | 0 refills | Status: DC

## 2023-09-20 ENCOUNTER — Ambulatory Visit (INDEPENDENT_AMBULATORY_CARE_PROVIDER_SITE_OTHER): Payer: 59 | Admitting: Physician Assistant

## 2023-09-20 VITALS — BP 127/63 | HR 86 | Ht 64.5 in | Wt 142.0 lb

## 2023-09-20 DIAGNOSIS — N644 Mastodynia: Secondary | ICD-10-CM | POA: Diagnosis not present

## 2023-09-20 DIAGNOSIS — E782 Mixed hyperlipidemia: Secondary | ICD-10-CM | POA: Diagnosis not present

## 2023-09-20 DIAGNOSIS — R350 Frequency of micturition: Secondary | ICD-10-CM | POA: Insufficient documentation

## 2023-09-20 DIAGNOSIS — K146 Glossodynia: Secondary | ICD-10-CM | POA: Insufficient documentation

## 2023-09-20 DIAGNOSIS — R252 Cramp and spasm: Secondary | ICD-10-CM | POA: Diagnosis not present

## 2023-09-20 DIAGNOSIS — R1011 Right upper quadrant pain: Secondary | ICD-10-CM | POA: Insufficient documentation
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
## 2023-09-20 NOTE — Patient Instructions (Signed)
 Get labs Will order breast imaging and abdominal ultrasound Start epson salt baths and regular leg stretches  Leg Cramps: What They Mean Leg cramps happen when one or more muscles tighten and there's no control over it. They can happen during exercise or when you're resting. Leg cramps are painful and can last for a few seconds to minutes. They can also come back many times before stopping. Usually, leg cramps aren't caused by a serious medical problem. Often, the cause isn't known. Some common causes include: Problems with moving or not moving the body, like: Working your muscles too hard, such as during intense exercise. Doing the same motion over and over. Not warming up or stretching before playing sports or doing activities. Using the wrong technique or form when playing sports or doing activities. Staying in one position for a long time. Water  or electrolyte balance issues, like: Not drinking enough fluids or being dehydrated. Getting sick from too much heat. Having low levels of minerals called electrolytes in your blood, like potassium and calcium . This can happen from: Pregnancy. Taking medicines that make you pee more, also called diuretic medicines. Not getting enough nutrients from your diet. Side effects of some medicines. Follow these instructions at home: Eating and drinking Eat and drink as told. Eat a healthy diet that includes plenty of nutrients to help your muscles work well. A healthy diet includes fruits and vegetables, lean protein, whole grains, and low-fat or nonfat dairy products. Drink enough fluids to keep your pee pale yellow. Drinking more water  may help prevent cramps. Managing pain and muscle cramping     Massage, stretch, and relax the cramped muscle. Do this for several minutes at a time. Use ice or an ice pack as told. Place a towel between your skin and the ice. Leave the ice on for 20 minutes, 2-3 times a day. Use heat as told. Use the heat  source that your provider recommends, such as a moist heat pack or a heating pad. Do this as often as told. Place a towel between your skin and the heat source. Leave the heat on for 20-30 minutes. If your skin turns red, take off the ice or heat right away to prevent skin damage. The risk of damage is higher if you can't feel pain, heat, or cold. Take hot showers or baths to help relax tight muscles. General instructions If you're having a lot of leg cramps, avoid hard workouts for several days. Take supplements and medicines only as told. Contact a health care provider if: Your leg cramps get worse or happen more often. Your leg cramps don't get better over time. Your foot becomes cold, numb, or blue. This information is not intended to replace advice given to you by your health care provider. Make sure you discuss any questions you have with your health care provider. Document Revised: 04/10/2023 Document Reviewed: 04/10/2023 Elsevier Patient Education  2024 [employer].

## 2023-09-21 ENCOUNTER — Encounter: Payer: Self-pay | Admitting: Physician Assistant

## 2023-09-23 ENCOUNTER — Encounter: Payer: Self-pay | Admitting: Physician Assistant

## 2023-09-23 LAB — CBC WITH DIFFERENTIAL/PLATELET
Basophils Absolute: 0.1 10*3/uL (ref 0.0–0.2)
Basos: 1 %
EOS (ABSOLUTE): 0.1 10*3/uL (ref 0.0–0.4)
Eos: 1 %
Hematocrit: 37.7 % (ref 34.0–46.6)
Hemoglobin: 12.7 g/dL (ref 11.1–15.9)
Immature Grans (Abs): 0 10*3/uL (ref 0.0–0.1)
Immature Granulocytes: 0 %
Lymphocytes Absolute: 1 10*3/uL (ref 0.7–3.1)
Lymphs: 22 %
MCH: 31.7 pg (ref 26.6–33.0)
MCHC: 33.7 g/dL (ref 31.5–35.7)
MCV: 94 fL (ref 79–97)
Monocytes Absolute: 0.5 10*3/uL (ref 0.1–0.9)
Monocytes: 10 %
Neutrophils Absolute: 3 10*3/uL (ref 1.4–7.0)
Neutrophils: 66 %
Platelets: 270 10*3/uL (ref 150–450)
RBC: 4.01 x10E6/uL (ref 3.77–5.28)
RDW: 11.8 % (ref 11.7–15.4)
WBC: 4.6 10*3/uL (ref 3.4–10.8)

## 2023-09-23 LAB — CMP14+EGFR
ALT: 16 [IU]/L (ref 0–32)
AST: 23 [IU]/L (ref 0–40)
Albumin: 4.5 g/dL (ref 3.9–4.9)
Alkaline Phosphatase: 70 [IU]/L (ref 44–121)
BUN/Creatinine Ratio: 13 (ref 12–28)
BUN: 15 mg/dL (ref 8–27)
Bilirubin Total: 0.4 mg/dL (ref 0.0–1.2)
CO2: 25 mmol/L (ref 20–29)
Calcium: 10 mg/dL (ref 8.7–10.3)
Chloride: 103 mmol/L (ref 96–106)
Creatinine, Ser: 1.12 mg/dL — ABNORMAL HIGH (ref 0.57–1.00)
Globulin, Total: 1.9 g/dL (ref 1.5–4.5)
Glucose: 81 mg/dL (ref 70–99)
Potassium: 5 mmol/L (ref 3.5–5.2)
Sodium: 145 mmol/L — ABNORMAL HIGH (ref 134–144)
Total Protein: 6.4 g/dL (ref 6.0–8.5)
eGFR: 53 mL/min/{1.73_m2} — ABNORMAL LOW (ref >59)

## 2023-09-23 LAB — TSH+FREE T4
Free T4: 1.2 ng/dL (ref 0.82–1.77)
TSH: 2.08 u[IU]/mL (ref 0.450–4.500)

## 2023-09-23 LAB — SEDIMENTATION RATE: Sed Rate: 5 mm/h (ref 0–40)

## 2023-09-23 LAB — MAGNESIUM: Magnesium: 2.7 mg/dL — ABNORMAL HIGH (ref 1.6–2.3)

## 2023-09-23 LAB — LIPID PANEL
Chol/HDL Ratio: 2.7 {ratio} (ref 0.0–4.4)
Cholesterol, Total: 207 mg/dL — ABNORMAL HIGH (ref 100–199)
HDL: 77 mg/dL (ref >39)
LDL Chol Calc (NIH): 112 mg/dL — ABNORMAL HIGH (ref 0–99)
Triglycerides: 102 mg/dL (ref 0–149)
VLDL Cholesterol Cal: 18 mg/dL (ref 5–40)

## 2023-09-23 LAB — C-REACTIVE PROTEIN: CRP: <1 mg/L (ref 0–10)

## 2023-09-23 LAB — ALLERGEN, CASEIN, F78

## 2023-09-23 LAB — LIPASE: Lipase: 38 U/L (ref 14–72)

## 2023-09-23 LAB — H. PYLORI BREATH TEST

## 2023-09-23 NOTE — Progress Notes (Signed)
 Helen,   I don't think you have to completely cut out magnesium  but I would cut in half.  No Casein IGG response.  Thyroid  looks great.  Lipase, pancreatic enzyme, normal.  H.pylori negative.  Normal CRP and ESR(inflammation)  Your kidney function dropped from 7 months ago.  Make sure staying hydrated.  Avoid any NSAIDs.  Recheck in 2 weeks Avoid any extra sodium.   Your LdL is elevated and your cardiovascular risk is elevated. You need to be on a statin. Will you consider?   SABRASABRAThe 10-year ASCVD risk score (Arnett DK, et al., 2019) is: 8.9%   Values used to calculate the score:     Age: 73 years     Sex: Female     Is Non-Hispanic African American: No     Diabetic: No     Tobacco smoker: No     Systolic Blood Pressure: 127 mmHg     Is BP treated: No     HDL Cholesterol: 77 mg/dL     Total Cholesterol: 207 mg/dL

## 2023-09-25 ENCOUNTER — Ambulatory Visit (INDEPENDENT_AMBULATORY_CARE_PROVIDER_SITE_OTHER): Payer: 59 | Admitting: Physician Assistant

## 2023-09-25 ENCOUNTER — Encounter: Payer: Self-pay | Admitting: Physician Assistant

## 2023-09-25 ENCOUNTER — Other Ambulatory Visit: Payer: Self-pay | Admitting: Physician Assistant

## 2023-09-25 VITALS — BP 126/80 | HR 70 | Temp 98.1°F | Ht 64.5 in | Wt 144.0 lb

## 2023-09-25 DIAGNOSIS — R29898 Other symptoms and signs involving the musculoskeletal system: Secondary | ICD-10-CM

## 2023-09-25 DIAGNOSIS — R944 Abnormal results of kidney function studies: Secondary | ICD-10-CM | POA: Diagnosis not present

## 2023-09-25 DIAGNOSIS — E870 Hyperosmolality and hypernatremia: Secondary | ICD-10-CM | POA: Diagnosis not present

## 2023-09-25 DIAGNOSIS — Z8051 Family history of malignant neoplasm of kidney: Secondary | ICD-10-CM | POA: Diagnosis not present

## 2023-09-25 DIAGNOSIS — R252 Cramp and spasm: Secondary | ICD-10-CM | POA: Diagnosis not present

## 2023-09-25 DIAGNOSIS — N644 Mastodynia: Secondary | ICD-10-CM

## 2023-09-25 NOTE — Patient Instructions (Addendum)
Will get renal ultrasound Get labs in the morning  Chronic Kidney Disease: Eating Plan Chronic kidney disease (CKD) is when your kidneys aren't working well. They can't remove waste, fluids, and other substances from your blood. When these substances build up, they can worsen kidney damage and affect your health. Eating certain foods can lead to a buildup of these substances. Changing your diet can help prevent more kidney damage. Diet changes may also delay dialysis or even keep you from needing it. What nutrients should I limit? Work with your health care team and an expert in healthy eating (dietitian) to make a meal plan that's right for you. Foods you can eat and foods you should limit or avoid will depend on: The stage of your kidney disease. Any other conditions you have. The items listed below are not a complete list. Talk with a dietitian to learn what's best for you. Potassium Potassium affects how well your heart beats. Too much potassium in your blood can cause an irregular heartbeat or even a heart attack. You may need to limit foods that are high in potassium, such as: Liquid milk and soy milk. Salt substitutes that contain potassium. Fruits like: Bananas. Apricots. Melon. Prunes and raisins. Kiwi. Nectarines and oranges. Vegetables, such as: Potatoes, sweet potatoes, and yams. Tomatoes. Leafy greens. Beets. Avocado. Pumpkin and winter squash. Beans, like lima beans. Nuts. Phosphorus Phosphorus is a mineral found in your bones. You need a balance between calcium and phosphorus to build and maintain healthy bones.  Too much added phosphorus from the foods you eat can pull calcium from your bones. Losing calcium can make your bones weak and more likely to break. Too much phosphorus can also make your skin itch. You may need to limit foods that are high in phosphorus or that have added phosphorus, such as: Liquid milk and dairy products. Dark-colored sodas or soft  drinks. Bran cereals and oatmeal. Protein  Protein helps your body make and keep muscle. Protein also helps to repair your body's cells and tissues.  One of the natural breakdown products of protein is a waste product called urea. When your kidneys aren't working well, they can't remove waste like urea. Reducing protein in your diet can help keep urea from building up in your blood. Depending on your stage of kidney disease, you may need to eat smaller portions of foods that are high in protein. Sources of animal protein include: Meat (all types). Fish and seafood. Poultry. Eggs. Dairy. Other protein foods include: Beans and legumes. Nuts and nut butter. Soy, like tofu.  Sodium Salt (sodium) helps to keep a healthy balance of fluids in your body. Too much salt can increase your blood pressure, which can harm your heart and lungs. Extra salt can also cause your body to keep too much fluid, making your kidneys work harder. You may need to limit or avoid foods that are high in salt, such as: Salt seasonings. Soy and teriyaki sauce. Meats that are: Packaged. Precooked. Cured. Processed. Salted crackers and snack foods. Fast food. Canned soups and foods. Pickled foods. Boxed mixes or ready-to-eat boxed meals and side dishes. Bottled dressings, sauces, and marinades. Talk with your dietitian about how much potassium, phosphorus, protein, and salt you may have each day. What are tips for following this plan? Reading food labels  Check the amount of salt in foods. Limit foods that have salt listed among the first five ingredients. Try to eat low-salt foods. Check the ingredient list for added phosphorus  or potassium. "Phos" in an ingredient is a sign that phosphorus has been added. Do not buy foods that are calcium-enriched or that have calcium added to them (fortified). Buy canned vegetables and beans that say "no salt added." Rinse them before eating. Lifestyle Limit the amount of  protein you eat from animal sources each day. Focus on protein from plant sources, like tofu and dried beans, peas, and lentils. Do not add salt to food when cooking or before eating. Do not eat star fruit. It can be toxic for people with kidney problems. Talk with your health care provider before taking any vitamin or mineral supplements. If told by your provider: Track how much liquid you have so you can avoid drinking too much. Try to eat foods that are made mostly from water, like gelatin, ice cream, soups, and juicy fruits and vegetables. If you have diabetes and chronic kidney disease: If you have diabetes and CKD, you need to keep your blood sugar (glucose) in the target range recommended by your provider. Follow your diabetes management plan. This may include: Checking your blood glucose regularly. Taking medicines by mouth, or taking insulin, or both. Exercising for at least 30 minutes on 5 or more days each week, or as told by your provider. Tracking how many servings of carbohydrates you eat at each meal. Not using orange juice to treat low blood sugars. Instead, use apple juice, cranberry juice, or clear soda. You may be given guidelines on what foods and nutrients you may eat, and how much you can have each day. This depends on your stage of kidney disease and whether you have high blood pressure. Follow the meal plan your dietitian gives you. Where to find more information [employer] Diabetes and Digestive and Kidney Diseases: [employer] National Kidney Foundation: kidney.org This information is not intended to replace advice given to you by your health care provider. Make sure you discuss any questions you have with your health care provider. Document Revised: 03/12/2023 Document Reviewed: 03/12/2023 Elsevier Patient Education  2024 [employer].

## 2023-09-25 NOTE — Progress Notes (Incomplete)
error 

## 2023-09-25 NOTE — Progress Notes (Unsigned)
Established Patient Office Visit  Subjective   Patient ID: Jamie [name], female    DOB: 1951/03/17  Age: 73 y.o. MRN: [id number]  Chief Complaint  Patient presents with   Medical Management of Chronic Issues    HPI Pt is a 73 yo female who presents to the clinic to discuss labs that were recently drawn.   She is really worried about her renal function since her mother died from kidney cancer.   She stopped her supplements because sodium and magnesium were elevated.   Her bilateral muscle cramps have improved.   She now is having bilateral arm achiness and weakness. She has not dropped anything. She denies any neck pain. She is worried about her adrenal function.   Patient Active Problem List   Diagnosis Date Noted   Decreased GFR 09/25/2023   Hypernatremia 09/25/2023   Hypermagnesemia 09/25/2023   Right upper quadrant pain 09/20/2023   Breast pain 09/20/2023   Bilateral leg cramps 09/20/2023   Tongue burning sensation 09/20/2023   Frequent urination 09/20/2023   Oral phase dysphagia 02/04/2023   Hyperkalemia 02/04/2023   Elevated fasting glucose 02/04/2023   History of anaphylaxis 10/25/2022   Multiple drug allergies 10/24/2022   Other adverse food reactions, not elsewhere classified, subsequent encounter 10/24/2022   Chronic rhinitis 10/24/2022   DDD (degenerative disc disease), lumbar 08/14/2022   Adenopathy, cervical 08/08/2022   Acute bilateral low back pain without sciatica 08/08/2022   Aortic atherosclerosis (HCC) 07/13/2022   Memory changes 06/26/2022   SOB (shortness of breath) on exertion 06/20/2022   Asymptomatic bacteriuria 06/11/2022   Allergy 05/17/2022   Anxiety 05/17/2022   Cataract 05/17/2022   Diverticulosis 05/17/2022   Dyspnea 05/17/2022   GERD (gastroesophageal reflux disease) 05/17/2022   IBS (irritable bowel syndrome) 05/17/2022   Pneumonia 05/17/2022   Pre-diabetes 05/17/2022   Acute pain of right shoulder 04/13/2022   Biceps tendonitis  on right 04/13/2022   Abnormal finding on urinalysis 02/27/2022   Genitourinary syndrome of menopause 02/27/2022   Pelvic floor tension 02/27/2022   Pneumaturia 02/27/2022   Rectocele 02/27/2022   Acquired hypothyroidism 02/07/2022   Bacteriuria 11/29/2021   Elevated random blood glucose level 11/29/2021   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   Antibiotic-resistant bacterial infection 11/20/2021   S/P laparoscopic-assisted sigmoidectomy 09/08/2021   Exposure to TB 06/21/2021   Exposure to hepatitis C 06/21/2021   Post herpetic neuralgia 06/21/2021   Herpes zoster without complication 06/02/2021   Bilateral tinnitus 12/23/2020   Leg cramping 12/23/2020   Left ear impacted cerumen 12/23/2020   Acute non-recurrent maxillary sinusitis 12/23/2020   Osteoporosis 12/14/2020   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   Unintentional weight loss 07/22/2020   Impacted cerumen, bilateral 02/09/2020   Neuropathy 02/02/2020   Viral respiratory infection 12/04/2019   GAD (generalized anxiety disorder) 10/12/2019   Hives of unknown origin 10/12/2019   Anaphylactic syndrome 10/12/2019   Moderate protein-calorie malnutrition (HCC) 10/07/2019   Allergic reaction 09/25/2019   Recurrent infections 09/25/2019   Seasonal allergic rhinitis due to pollen 09/25/2019   OAB (overactive bladder) 09/21/2019   Stress due to family tension 07/15/2019   Epigastric pain 07/06/2019   Tachycardia 01/02/2019   Numbness and tingling of right side of face 12/31/2018   Right-sided headache 12/31/2018   Cervical radiculopathy 10/01/2018   Vaginal irritation 09/15/2018   Anal itching 09/15/2018   Urinary frequency 06/13/2018   ETD (Eustachian tube dysfunction), right 06/11/2018   Fatigue 01/15/2018   Heel pain, bilateral 01/15/2018   Muscle strain  of upper back 01/15/2018   Acute bilateral low back pain with bilateral sciatica 10/07/2017   Pelvic floor weakness 09/16/2017   Bilateral headaches 09/16/2017   Adverse food  reaction 09/02/2017   Vitamin D deficiency 08/23/2017   Iron deficiency anemia secondary to inadequate dietary iron intake 08/23/2017   Abnormal urine odor 08/23/2017   Atypical chest pain 08/23/2017   Abdominal bloating 08/23/2017   Anxiety about health 08/23/2017   Carotid stenosis, asymptomatic, bilateral 05/08/2017   History of excessive cerumen 05/08/2017   Cardiovascular risk factor 05/08/2017   Gastritis and duodenitis 04/15/2017   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   Vision changes 01/25/2017   Hematuria 01/25/2017   Hx of diverticulitis of colon 01/25/2017   Acute left lower quadrant pain 01/25/2017   Exposure to chemical inhalation 11/06/2016   Right knee pain 11/05/2016   Former smoker 05/14/2016   Hypopigmentation 01/11/2016   Anaphylactic reaction due to food 01/11/2016   Vaginal dryness 01/11/2016   Multiple food allergies 01/11/2016   Adhesive capsulitis of shoulder 10/17/2015   Carpal tunnel syndrome 10/17/2015   Left arm numbness 10/13/2015   Neck pain 10/13/2015   Pernicious anemia 10/13/2015   Chronically dry eyes 10/13/2015   DDD (degenerative disc disease), cervical 08/31/2015   Mid back pain on left side 07/20/2015   Hair loss 07/20/2015   Family history of renal cancer 07/20/2015   History of shingles 07/19/2015   Low iron stores 07/19/2015   Subclinical hypothyroidism 07/19/2015   Constipation 07/19/2015   Hyperpigmentation of skin 07/19/2015   Depression with anxiety 06/18/2015   Normocytic anemia 06/18/2015   Diverticulitis of large intestine with abscess without bleeding    B12 deficiency 06/13/2015   Migraine with aura and with status migrainosus, not intractable 04/20/2015   Lung nodule 04/20/2015   Thyroid disease 03/08/2015   Generalized abdominal pain 01/24/2014   Osteopenia 01/20/2014   Change in consistency of stool 01/01/2014   Nausea 01/29/2012   Recurrent urinary tract infection 01/29/2012    Seasonal and perennial allergic rhinitis 11/06/2011   Diverticulosis of large intestine 07/03/2011   Hyperlipidemia 12/13/2010   ONYCHOMYCOSIS, TOENAILS 06/09/2010   ANXIETY DEPRESSION 06/09/2010   Past Medical History:  Diagnosis Date   Abdominal bloating 08/23/2017   Abnormal finding on urinalysis 02/27/2022   Last Assessment & Plan:  Formatting of this note might be different from the original. 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   Antibiotic-resistant bacterial infection 11/20/2021   Anxiety    Anxiety about health 08/23/2017   ANXIETY DEPRESSION 06/09/2010   Qualifier: Diagnosis of  By: Peggyann Juba FNP, Melissa S    Asymptomatic bacteriuria 06/11/2022   Atypical chest pain 08/23/2017   B12 deficiency 06/13/2015   Bacteriuria 11/29/2021   Biceps tendonitis on right 04/13/2022   Bilateral headaches    Bilateral tinnitus 12/23/2020   Cardiovascular risk factor 05/08/2017   Carotid stenosis, asymptomatic, bilateral 05/08/2017  Carotid dopplers showed bilateral ICA stenosis 1-39 percent 2016 Minor carotid intimal thickening without significant atherosclerosis. No hemodynamically significant ICA stenosis. Degree of narrowing less than 50% bilaterally. 08/2015  1. Mild (1-49%) stenosis proximal right internal carotid artery secondary to heterogenous atherosclerotic plaque. 2. Mild (1-49%) stenosis proximal left internal car   Carpal tunnel syndrome 10/17/2015    Cataract    bilateral   Cervical radiculopathy 10/01/2018   Xray 09/2018: cervical DDD, disc height loss at C5 and C6.    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   Constipation 07/19/2015   DDD (degenerative disc disease), cervical 08/31/2015   Depression with anxiety 06/18/2015   Diverticulitis of large intestine with abscess without bleeding    Diverticulosis    Diverticulosis of large intestine 07/03/2011   Dyspnea    Elevated random blood glucose level 11/29/2021   Epigastric pain 07/06/2019   ETD (Eustachian tube dysfunction), right 06/11/2018   Exposure to chemical inhalation 11/06/2016   Exposure to hepatitis C 06/21/2021   Exposure to TB 06/21/2021   Extrinsic asthma, unspecified    Family history of renal cancer 07/20/2015   Fatigue 01/15/2018   Former smoker 05/14/2016   GAD (generalized anxiety disorder) 10/12/2019   Gastritis and duodenitis 04/15/2017   Generalized abdominal pain 01/24/2014   Genitourinary syndrome of menopause 02/27/2022   Formatting of this note might be different from the original. On premarin cream  Last Assessment & Plan:  Formatting of this note might be different from the original. - continue premarin cream 2-3x per week   GERD (gastroesophageal reflux disease)    Hair loss 07/20/2015   Heel pain, bilateral 01/15/2018   Hematuria 01/25/2017   Herpes zoster without complication 06/02/2021   History of excessive cerumen 05/08/2017   History of shingles 07/19/2015   Hives of unknown origin 10/12/2019   Hx of diverticulitis of colon 01/25/2017   Hyperlipidemia    Hyperpigmentation of skin 07/19/2015   Hypopigmentation 01/11/2016   IBS (irritable bowel syndrome)    Impacted cerumen, bilateral 02/09/2020   Iron deficiency anemia secondary to inadequate dietary iron intake 08/23/2017   Left arm numbness 10/13/2015   Left ear impacted cerumen 12/23/2020   Leg cramping 12/23/2020   Low iron stores 07/19/2015    Lung mass    Mid back pain on left side 07/20/2015   Migraine with aura and with status migrainosus, not intractable    Moderate protein-calorie malnutrition (HCC) 10/07/2019   Multiple food allergies 01/11/2016   Muscle strain of upper back 01/15/2018   Nausea 01/29/2012   Neck pain 10/13/2015   Neuropathy 02/02/2020   Normocytic anemia 06/18/2015   Numbness and tingling of right side of face 12/31/2018   OAB (overactive bladder) 09/21/2019   ONYCHOMYCOSIS, TOENAILS 06/09/2010   Qualifier: Diagnosis of  By: Peggyann Juba FNP, Melissa S    Osteopenia 01/20/2014   Osteoporosis 12/14/2020   -2.8. discussed starting medication.    Pelvic floor tension 02/27/2022   Formatting of this note might be different from the original. 02/27/2022 very tender on exam, particularly on the right  Last Assessment & Plan:  Formatting of this note might be different from the original. We discussed the pathophysiology of levator spasm, which can be triggered by any painful or traumatic pelvic episode, such as a severe UTI, pelvic surgery, sexual abuse, or chronic conditions.    Pelvic floor weakness 09/16/2017   Pernicious anemia 10/13/2015   Pneumaturia 02/27/2022  Pneumonia    Post herpetic neuralgia 06/21/2021   Pre-diabetes    Rectocele 02/27/2022   Formatting of this note might be different from the original. 02/27/2022  Describes a bulge or pocket in her vagina, has to splint for BM. Worsened by bearing down.    Last Assessment & Plan:  Formatting of this note might be different from the original. No evidence of prolapse/rectocele on exam today. CTM, meanwhile advised pt to avoid splinting, optimize diet, and f/u with pelvic floor PT   Recurrent infections 09/25/2019   Recurrent urinary tract infection 01/29/2012   Right knee pain 11/05/2016   Right-sided headache    S/P laparoscopic-assisted sigmoidectomy 09/08/2021   Seasonal allergic rhinitis due to pollen 09/25/2019   Seasonal and perennial allergic rhinitis 11/06/2011    Allergy skin test 02/13/2012: Mild to moderate reactions mainly for grass, weed, tree pollens, house dust/mite Allergy profile 12/20/2011: Total IgE 33.5. No specific elevations including shrimp.   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 treatment plan  repeat scan in one year and then if stable at that point we'll repeat every 2-3 years for monitoring.   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   Thyroid disease    Unintentional weight loss 07/22/2020   Urinary frequency 06/13/2018   Vaginal dryness 01/11/2016   Vaginal irritation 09/15/2018   Viral respiratory infection 12/04/2019   Vision changes 01/25/2017   Vitamin D deficiency 08/23/2017   Past Surgical History:  Procedure Laterality Date   ABDOMINAL HYSTERECTOMY     COLONOSCOPY     CYSTOSCOPY WITH STENT PLACEMENT Bilateral 09/08/2021   Procedure: CYSTOSCOPY WITH STENT PLACEMENT;  Surgeon: Bjorn Pippin, MD;  Location: [facility];  Service: Urology;  Laterality: Bilateral;   FLEXIBLE SIGMOIDOSCOPY N/A 09/08/2021   Procedure: FLEXIBLE SIGMOIDOSCOPY;  Surgeon: Andria Meuse, MD;  Location: [facility];  Service: General;  Laterality: N/A;   HEMORRHOID SURGERY     LYSIS OF ADHESION N/A 09/08/2021   Procedure: LYSIS OF ADHESION;  Surgeon: Andria Meuse, MD;  Location: [facility];  Service: General;  Laterality: N/A;   TUBAL LIGATION     TUMOR REMOVAL     Left thigh    Family History  Problem Relation Age of Onset   Diabetes Mother    Liver cancer Mother        renal cell    Heart disease Father        first MI in 91s   Cervical cancer Sister    Allergic rhinitis Sister    Asthma Sister    Heart disease Brother        first MI in 41s   Alcohol abuse Sister    Fibromyalgia Sister    Pancreatic cancer Sister 82   Arthritis Sister        osteoarthritis    Emphysema Sister    Irritable bowel syndrome Sister    Emphysema Sister    Leukemia Other        grandson   Urticaria Daughter    Allergic rhinitis Daughter    Stomach cancer Neg Hx    Colon cancer Neg Hx    Angioedema Neg Hx    Eczema Neg Hx    Immunodeficiency Neg Hx    Colon polyps Neg Hx    Esophageal cancer Neg Hx  Rectal cancer Neg Hx    Allergies  Allergen Reactions   Bactrim [Sulfamethoxazole-Trimethoprim] Anaphylaxis   Dairycare [Bacid] Anaphylaxis   Ciprofloxacin     Facial numbness   Iodine Rash   Iodinated Contrast Media Other (See Comments)    Face and chest felt "hot", throat felt tight.    Levaquin [Levofloxacin]     rash   Macrobid [Nitrofurantoin]     Increased BP/kidney function decline   Peanut Butter Flavoring Agent (Non-Screening) Nausea And Vomiting    N/V   Shellfish Allergy Swelling      ROS See HPI.    Objective:     There were no vitals taken for this visit. BP Readings from Last 3 Encounters:  09/26/23 (!) 142/74  09/25/23 126/80  09/20/23 127/63   Wt Readings from Last 3 Encounters:  09/26/23 142 lb (64.4 kg)  09/25/23 144 lb (65.3 kg)  09/20/23 142 lb (64.4 kg)      Physical Exam Constitutional:      Appearance: Normal appearance.  HENT:     Head: Normocephalic.  Cardiovascular:     Rate and Rhythm: Normal rate and regular rhythm.  Pulmonary:     Effort: Pulmonary effort is normal.  Musculoskeletal:     Right lower leg: No edema.     Left lower leg: No edema.     Comments: NROM of bilateral arms.  No pain to palpation over bilateral arms No pain to palpate neck NROM of neck Strength upper ext 5/5.  Neurological:     General: No focal deficit present.     Mental Status: She is alert and oriented to person, place, and time.  Psychiatric:        Mood and Affect: Mood normal.       The 10-year ASCVD risk score (Arnett DK, et al., 2019) is: 8.9%    Assessment & Plan:  Marland KitchenMarland KitchenMary "Myriam Jacobson" was seen today for  arm pain.  Diagnoses and all orders for this visit:  Bilateral arm weakness -     CMP14+EGFR -     Magnesium -     ANA,IFA RA Diag Pnl w/rflx Tit/Patn -     Cortisol -     Cortisol, urine, free  Bilateral leg cramps -     CMP14+EGFR -     Magnesium -     ANA,IFA RA Diag Pnl w/rflx Tit/Patn -     Cortisol -     Cortisol, urine, free  Family history of renal cancer -     US RENAL; Future -     Cortisol, urine, free  Hypernatremia -     CMP14+EGFR -     Cortisol, urine, free  Decreased GFR -     CMP14+EGFR -     US RENAL; Future -     Cortisol, urine, free  Hypermagnesemia -     Magnesium -     Cortisol, urine, free    Reviewed labs  Discussed decreased GFR and dietary changes to make(low sodium/potassium/phosphorus) Discussed medications like ACE/ARB and SGLT-2 for protection Renal u/s 8/24 CT abd/pelvis 7/24 Without abnormal finding BP to goal No diabetes Make sure staying hydrated No protein in urine Will check adrenal hormones and repeat CMP Continue magnesium at bedtime Follow up after imaging and labs      Tandy Gaw, PA-C

## 2023-09-26 ENCOUNTER — Encounter: Payer: Self-pay | Admitting: Physician Assistant

## 2023-09-26 ENCOUNTER — Other Ambulatory Visit: Payer: Self-pay

## 2023-09-26 ENCOUNTER — Emergency Department (HOSPITAL_COMMUNITY)
Admission: EM | Admit: 2023-09-26 | Discharge: 2023-09-27 | Discharge status: Against Medical Advice | Payer: 59 | Attending: Plastic Surgery | Admitting: Plastic Surgery

## 2023-09-26 DIAGNOSIS — Z5321 Procedure and treatment not carried out due to patient leaving prior to being seen by health care provider: Secondary | ICD-10-CM | POA: Diagnosis not present

## 2023-09-26 DIAGNOSIS — H538 Other visual disturbances: Secondary | ICD-10-CM | POA: Diagnosis not present

## 2023-09-26 DIAGNOSIS — R519 Headache, unspecified: Secondary | ICD-10-CM | POA: Diagnosis not present

## 2023-09-26 DIAGNOSIS — R29898 Other symptoms and signs involving the musculoskeletal system: Secondary | ICD-10-CM | POA: Diagnosis not present

## 2023-09-26 DIAGNOSIS — R944 Abnormal results of kidney function studies: Secondary | ICD-10-CM | POA: Diagnosis not present

## 2023-09-26 DIAGNOSIS — R252 Cramp and spasm: Secondary | ICD-10-CM | POA: Diagnosis not present

## 2023-09-26 DIAGNOSIS — E870 Hyperosmolality and hypernatremia: Secondary | ICD-10-CM | POA: Diagnosis not present

## 2023-09-26 NOTE — ED Triage Notes (Signed)
Pt had leg cramps starting two weeks ago. She states her mag has been high. She has a headache today. It has made her vision blurry. She denies hx of headache. She feels sleepy.

## 2023-09-26 NOTE — ED Notes (Signed)
Pt. Called to triage for vital signs, no answer.

## 2023-09-26 NOTE — ED Notes (Signed)
Pt ambulatory to bathroom in triage

## 2023-09-27 ENCOUNTER — Encounter: Payer: Self-pay | Admitting: Physician Assistant

## 2023-09-27 DIAGNOSIS — E870 Hyperosmolality and hypernatremia: Secondary | ICD-10-CM

## 2023-09-27 NOTE — Progress Notes (Signed)
Jamie [name],   Magnesium in normal range.  Kidney function is much better.  Sodium is normal.   Magnesium or sodium is not causing headaches.   Your potassium is higher than was 7 days ago. Make sure you are not taking any potassium supplements.  Recheck Monday next week.   Your cortisol is elevated. Interested to see what urine cortisol will look like.     Marland Kitchen.Keep headache diary and bring to your next appointment  Lifestyle changes to improve headache frequency:  Send- rescue and daily.   Regular sleep routine -- go to bed at the same time and get up at the same time-- everyday, even on the weekends Regular exercise-40 minutes 3-4 times weekly (150 minutes weekly) Maintaining a healthy weight--portion control Avoid caffeine. Decaf tea or coffee is ok. (limit to150 mg daily) Avoid soda (caffeine or non) Avoid artificial sweetners Avoid alcohol Avoid smoking/secondhand smoke Avoid ibuprofen, acetaminophen, naproxen, pseudoephedrine and other OTC analgesics Avoid opioids, narcotics and prescribed pain medication  Rebound Headaches/Medication overuse headache -- Overusing rescue medications more than twice weekly will worsen headache frequency and intensity over time. Will improve as you wean over the counter medications, ie-- Ibuprofen/ Tylenol/Excedrin, etc. and caffeine- Headaches may get worse before getting better as you withdraw from medication overuse and/or caffeine   Start Vitamin B2 100-200mg  twice daily OR Migrelief (on [employer])

## 2023-09-30 ENCOUNTER — Ambulatory Visit (INDEPENDENT_AMBULATORY_CARE_PROVIDER_SITE_OTHER): Payer: 59 | Admitting: Physician Assistant

## 2023-09-30 ENCOUNTER — Encounter: Payer: Self-pay | Admitting: Physician Assistant

## 2023-09-30 ENCOUNTER — Ambulatory Visit (INDEPENDENT_AMBULATORY_CARE_PROVIDER_SITE_OTHER): Payer: 59

## 2023-09-30 VITALS — BP 118/63 | HR 70 | Ht 64.0 in | Wt 140.0 lb

## 2023-09-30 DIAGNOSIS — R519 Headache, unspecified: Secondary | ICD-10-CM | POA: Insufficient documentation

## 2023-09-30 DIAGNOSIS — R7989 Other specified abnormal findings of blood chemistry: Secondary | ICD-10-CM | POA: Insufficient documentation

## 2023-09-30 DIAGNOSIS — R944 Abnormal results of kidney function studies: Secondary | ICD-10-CM | POA: Diagnosis not present

## 2023-09-30 DIAGNOSIS — Z8051 Family history of malignant neoplasm of kidney: Secondary | ICD-10-CM

## 2023-09-30 DIAGNOSIS — F419 Anxiety disorder, unspecified: Secondary | ICD-10-CM

## 2023-09-30 DIAGNOSIS — E875 Hyperkalemia: Secondary | ICD-10-CM

## 2023-09-30 MED ORDER — DEXAMETHASONE 4 MG PO TABS
4.0000 mg | ORAL_TABLET | Freq: Two times a day (BID) | ORAL | 0 refills | Status: DC
Start: 2023-09-30 — End: 2023-10-21

## 2023-09-30 NOTE — Progress Notes (Signed)
Kidneys look great! Bladder looks great!

## 2023-09-30 NOTE — Progress Notes (Signed)
Established Patient Office Visit  Subjective   Patient ID: Jamie [name], female    DOB: 02-Apr-1951  Age: 73 y.o. MRN: [id number]  Chief Complaint  Patient presents with   Medical Management of Chronic Issues    Lab fup    HPI Patient is a 73 yo female who presents today to follow-up on labs.   She is still having headaches daily. She states her head hurts on both sides. She states her vision is blurry and her eyes hurt. She endorses some sinus congestion, mucus in her throat.   Review of Systems  All other systems reviewed and are negative.    Objective:    BP 118/63   Pulse 70   Ht 5\' 4"  (1.626 m)   Wt 63.5 kg   SpO2 99%   BMI 24.03 kg/m  BP Readings from Last 3 Encounters:  09/30/23 118/63  09/26/23 (!) 142/74  09/25/23 126/80   Wt Readings from Last 3 Encounters:  09/30/23 63.5 kg  09/26/23 64.4 kg  09/25/23 65.3 kg   Physical Exam Constitutional:      Appearance: Normal appearance.  HENT:     Head: Normocephalic and atraumatic.  Eyes:     Extraocular Movements: Extraocular movements intact.  Cardiovascular:     Rate and Rhythm: Normal rate and regular rhythm.     Pulses: Normal pulses.     Heart sounds: Normal heart sounds.  Pulmonary:     Effort: Pulmonary effort is normal.     Breath sounds: Normal breath sounds.  Musculoskeletal:     Cervical back: Normal range of motion.  Skin:    General: Skin is warm.  Neurological:     Mental Status: She is alert.  Psychiatric:        Mood and Affect: Mood normal.    Last CBC Lab Results  Component Value Date   WBC 4.6 09/20/2023   HGB 12.7 09/20/2023   HCT 37.7 09/20/2023   MCV 94 09/20/2023   MCH 31.7 09/20/2023   RDW 11.8 09/20/2023   PLT 270 09/20/2023   Last metabolic panel Lab Results  Component Value Date   GLUCOSE 84 09/26/2023   NA 141 09/26/2023   K 5.3 (H) 09/26/2023   CL 101 09/26/2023   CO2 22 09/26/2023   BUN 17 09/26/2023   CREATININE 0.96 09/26/2023   EGFR 64 09/26/2023    CALCIUM 10.0 09/26/2023   PROT 7.0 09/26/2023   ALBUMIN 4.7 09/26/2023   LABGLOB 2.3 09/26/2023   AGRATIO 2.2 11/06/2021   BILITOT 0.7 09/26/2023   ALKPHOS 75 09/26/2023   AST 23 09/26/2023   ALT 16 09/26/2023   ANIONGAP 7 05/17/2022   Last lipids Lab Results  Component Value Date   CHOL 207 (H) 09/20/2023   HDL 77 09/20/2023   LDLCALC 112 (H) 09/20/2023   LDLDIRECT 145 (H) 11/30/2015   TRIG 102 09/20/2023   CHOLHDL 2.7 09/20/2023   Last thyroid functions Lab Results  Component Value Date   TSH 2.080 09/20/2023   T4TOTAL 8.4 02/01/2022   Last vitamin D Lab Results  Component Value Date   VD25OH 33 12/25/2022   Last vitamin B12 and Folate Lab Results  Component Value Date   VITAMINB12 298 12/25/2022   FOLATE 20.5 12/25/2022   The 10-year ASCVD risk score (Arnett DK, et al., 2019) is: 7.7%   Assessment & Plan:   Problem List Items Addressed This Visit   None - Discussed free cortisol levels being elevated,  but told patient we are waiting on 24hr urine cortisol  - Will follow-up on this today.  - Last CMP was discussed with patient - improvement in her GFR and kidney function  - Discussed normal sodium levels with patient  - Dicussed normal magnesium levels with patient   - Repeat serum cortisol levels today - Patient instructed to go get kidney ultrasound to check her kidneys and adrenal glands.  - Discussed potassium being elevated. Patient states she is not on a supplement and she quit eating bananas daily.   - Recheck Potassium today  - Order CMP + eGFR - Discussed ANA being negative for any autoimmune diseases - Start migraine pill x5 days for frontal headache symptoms.   Ilean China, Student-PA

## 2023-09-30 NOTE — Patient Instructions (Signed)
Will recheck labs Start decadron for frontal headache Schedule renal ultrasound

## 2023-10-01 ENCOUNTER — Encounter: Payer: Self-pay | Admitting: Physician Assistant

## 2023-10-01 LAB — CORTISOL: Cortisol: 10.1 ug/dL (ref 6.2–19.4)

## 2023-10-01 LAB — ACTH: ACTH: 21.3 pg/mL (ref 7.2–63.3)

## 2023-10-01 LAB — POTASSIUM: Potassium: 5.3 mmol/L — ABNORMAL HIGH (ref 3.5–5.2)

## 2023-10-01 MED ORDER — ALPRAZOLAM 0.5 MG PO TABS
ORAL_TABLET | ORAL | 5 refills | Status: DC
Start: 2023-10-01 — End: 2024-03-03

## 2023-10-01 NOTE — Progress Notes (Signed)
Potassium level stable and really not high enough to treat. Make sure drinking enough water.  Cortisol levels normal range so this looks to be just a temporary cortisol elevation.  Urine will take a few more days to process.   ACTH pending.

## 2023-10-02 ENCOUNTER — Encounter: Payer: Self-pay | Admitting: Physician Assistant

## 2023-10-02 LAB — CMP14+EGFR
ALT: 16 [IU]/L (ref 0–32)
AST: 23 [IU]/L (ref 0–40)
Albumin: 4.7 g/dL (ref 3.9–4.9)
Alkaline Phosphatase: 75 [IU]/L (ref 44–121)
BUN/Creatinine Ratio: 18 (ref 12–28)
BUN: 17 mg/dL (ref 8–27)
Bilirubin Total: 0.7 mg/dL (ref 0.0–1.2)
CO2: 22 mmol/L (ref 20–29)
Calcium: 10 mg/dL (ref 8.7–10.3)
Chloride: 101 mmol/L (ref 96–106)
Creatinine, Ser: 0.96 mg/dL (ref 0.57–1.00)
Globulin, Total: 2.3 g/dL (ref 1.5–4.5)
Glucose: 84 mg/dL (ref 70–99)
Potassium: 5.3 mmol/L — ABNORMAL HIGH (ref 3.5–5.2)
Sodium: 141 mmol/L (ref 134–144)
Total Protein: 7 g/dL (ref 6.0–8.5)
eGFR: 64 mL/min/{1.73_m2} (ref >59)

## 2023-10-02 LAB — MAGNESIUM: Magnesium: 2.3 mg/dL (ref 1.6–2.3)

## 2023-10-02 LAB — ANA,IFA RA DIAG PNL W/RFLX TIT/PATN
Cyclic Citrullin Peptide Ab: 4 U (ref 0–19)
Rheumatoid fact SerPl-aCnc: <10 [IU]/mL (ref <14.0)

## 2023-10-02 LAB — CORTISOL: Cortisol: 21.6 ug/dL — ABNORMAL HIGH (ref 6.2–19.4)

## 2023-10-03 ENCOUNTER — Encounter: Payer: Self-pay | Admitting: Physician Assistant

## 2023-10-04 ENCOUNTER — Ambulatory Visit (INDEPENDENT_AMBULATORY_CARE_PROVIDER_SITE_OTHER): Payer: 59

## 2023-10-04 VITALS — BP 119/70 | HR 78

## 2023-10-04 DIAGNOSIS — E870 Hyperosmolality and hypernatremia: Secondary | ICD-10-CM | POA: Diagnosis not present

## 2023-10-04 DIAGNOSIS — Z0130 Encounter for examination of blood pressure without abnormal findings: Secondary | ICD-10-CM | POA: Diagnosis not present

## 2023-10-04 NOTE — Progress Notes (Signed)
   Established Patient Office Visit  Subjective   Patient ID: Jamie [name], female    DOB: 1950/11/21  Age: 73 y.o. MRN: [id number]  Chief Complaint  Patient presents with   Blood Pressure Check    HPI  ADALINA DOPSON is here for blood pressure check.     ROS    Objective:     BP 119/70   Pulse 78   SpO2 100%    Physical Exam   No results found for any visits on 10/04/23.    The 10-year ASCVD risk score (Arnett DK, et al., 2019) is: 7.8%    Assessment & Plan:  Blood pressure check - Blood pressure within normal limits.   Problem List Items Addressed This Visit   None Visit Diagnoses       Blood pressure check    -  Primary       No follow-ups on file.    Esmond Harps, CMA

## 2023-10-04 NOTE — Progress Notes (Signed)
ANA negative. No signs of autoimmune diseases.

## 2023-10-05 ENCOUNTER — Encounter: Payer: Self-pay | Admitting: Physician Assistant

## 2023-10-05 LAB — CMP14+EGFR
ALT: 16 [IU]/L (ref 0–32)
AST: 22 [IU]/L (ref 0–40)
Albumin: 4.4 g/dL (ref 3.9–4.9)
Alkaline Phosphatase: 68 [IU]/L (ref 44–121)
BUN/Creatinine Ratio: 13 (ref 12–28)
BUN: 12 mg/dL (ref 8–27)
Bilirubin Total: 0.5 mg/dL (ref 0.0–1.2)
CO2: 27 mmol/L (ref 20–29)
Calcium: 10 mg/dL (ref 8.7–10.3)
Chloride: 106 mmol/L (ref 96–106)
Creatinine, Ser: 0.92 mg/dL (ref 0.57–1.00)
Globulin, Total: 2 g/dL (ref 1.5–4.5)
Glucose: 95 mg/dL (ref 70–99)
Potassium: 5.4 mmol/L — ABNORMAL HIGH (ref 3.5–5.2)
Sodium: 145 mmol/L — ABNORMAL HIGH (ref 134–144)
Total Protein: 6.4 g/dL (ref 6.0–8.5)
eGFR: 67 mL/min/{1.73_m2} (ref >59)

## 2023-10-06 ENCOUNTER — Encounter: Payer: Self-pay | Admitting: Physician Assistant

## 2023-10-07 ENCOUNTER — Encounter: Payer: Self-pay | Admitting: Physician Assistant

## 2023-10-07 NOTE — Progress Notes (Signed)
 Potassium elevated just a little again. I would like to try a baby dose diuretic to get it back down. Are you ok with this? It would also help the sodium as well.

## 2023-10-08 ENCOUNTER — Encounter: Payer: Self-pay | Admitting: Physician Assistant

## 2023-10-08 MED ORDER — HYDROCHLOROTHIAZIDE 12.5 MG PO TABS: 12.5000 mg | ORAL_TABLET | Freq: Every day | ORAL | 0 refills | Status: AC

## 2023-10-09 NOTE — Telephone Encounter (Signed)
 Patient scheduled for nurse visit tomorrow to check urinalysis and urine culture =kph

## 2023-10-09 NOTE — Telephone Encounter (Signed)
 Spoke with patient - informed what symtpomatic meant and told her to start with 1/2 pill taken in mornings and recheck lab work after being on medication x 1 week .

## 2023-10-10 ENCOUNTER — Ambulatory Visit: Payer: 59

## 2023-10-10 ENCOUNTER — Ambulatory Visit
Admission: RE | Admit: 2023-10-10 | Discharge: 2023-10-10 | Disposition: A | Discharge status: Home / Self Care | Payer: 59 | Source: Ambulatory Visit | Attending: Physician Assistant | Admitting: Physician Assistant

## 2023-10-10 ENCOUNTER — Other Ambulatory Visit: Payer: Self-pay

## 2023-10-10 DIAGNOSIS — N644 Mastodynia: Secondary | ICD-10-CM

## 2023-10-10 DIAGNOSIS — N390 Urinary tract infection, site not specified: Secondary | ICD-10-CM

## 2023-10-11 ENCOUNTER — Encounter: Payer: Self-pay | Admitting: Physician Assistant

## 2023-10-11 LAB — URINALYSIS, MICROSCOPIC ONLY
Bacteria, UA: NONE SEEN
Casts: NONE SEEN /[LPF]
RBC, Urine: NONE SEEN /[HPF] (ref 0–2)

## 2023-10-11 NOTE — Progress Notes (Signed)
 Jamie [name],   Your urine looks GREAT! No bacteria seen. No blood.

## 2023-10-11 NOTE — Progress Notes (Signed)
 No concerns on mammogram

## 2023-10-12 LAB — URINE CULTURE

## 2023-10-14 ENCOUNTER — Encounter: Payer: Self-pay | Admitting: Physician Assistant

## 2023-10-14 ENCOUNTER — Other Ambulatory Visit: Payer: Self-pay | Admitting: Physician Assistant

## 2023-10-14 DIAGNOSIS — Z79899 Other long term (current) drug therapy: Secondary | ICD-10-CM

## 2023-10-14 NOTE — Progress Notes (Signed)
 No clinically significant bacteria found in urine.

## 2023-10-16 ENCOUNTER — Encounter: Payer: Self-pay | Admitting: Physician Assistant

## 2023-10-18 ENCOUNTER — Other Ambulatory Visit: Payer: Self-pay

## 2023-10-18 DIAGNOSIS — Z79899 Other long term (current) drug therapy: Secondary | ICD-10-CM

## 2023-10-18 NOTE — Progress Notes (Signed)
 Switched labs to [facility].

## 2023-10-19 LAB — BMP8+EGFR
BUN/Creatinine Ratio: 13 (ref 12–28)
BUN: 12 mg/dL (ref 8–27)
CO2: 27 mmol/L (ref 20–29)
Calcium: 9.6 mg/dL (ref 8.7–10.3)
Chloride: 104 mmol/L (ref 96–106)
Creatinine, Ser: 0.95 mg/dL (ref 0.57–1.00)
Glucose: 157 mg/dL — ABNORMAL HIGH (ref 70–99)
Potassium: 5.3 mmol/L — ABNORMAL HIGH (ref 3.5–5.2)
Sodium: 144 mmol/L (ref 134–144)
eGFR: 64 mL/min/{1.73_m2} (ref >59)

## 2023-10-20 MED ORDER — PROMETHAZINE HCL 25 MG PO TABS: 25.0000 mg | ORAL_TABLET | Freq: Four times a day (QID) | ORAL | 0 refills | Status: DC | PRN

## 2023-10-21 ENCOUNTER — Other Ambulatory Visit: Payer: Self-pay | Admitting: Physician Assistant

## 2023-10-21 ENCOUNTER — Encounter: Payer: Self-pay | Admitting: Physician Assistant

## 2023-10-21 NOTE — Progress Notes (Signed)
 Have you started diuretic?

## 2023-10-22 ENCOUNTER — Encounter: Payer: Self-pay | Admitting: Physician Assistant

## 2023-10-22 DIAGNOSIS — R252 Cramp and spasm: Secondary | ICD-10-CM

## 2023-10-22 MED ORDER — TRULANCE 3 MG PO TABS: 1.0000 | ORAL_TABLET | Freq: Every day | ORAL | 1 refills | Status: DC

## 2023-10-23 MED ORDER — LINACLOTIDE 290 MCG PO CAPS: 290.0000 ug | ORAL_CAPSULE | Freq: Every day | ORAL | 3 refills | Status: AC

## 2023-10-23 NOTE — Addendum Note (Signed)
 Addended by: Jomarie Longs on: 10/23/2023 04:37 PM   Modules accepted: Orders

## 2023-10-29 NOTE — Addendum Note (Signed)
 Addended by: Chalmers Cater on: 10/29/2023 01:24 PM   Modules accepted: Orders

## 2023-10-30 NOTE — Addendum Note (Signed)
 Addended by: Jomarie Longs on: 10/30/2023 04:40 PM   Modules accepted: Orders

## 2023-10-31 DIAGNOSIS — R252 Cramp and spasm: Secondary | ICD-10-CM | POA: Diagnosis not present

## 2023-11-01 ENCOUNTER — Encounter: Payer: Self-pay | Admitting: Physician Assistant

## 2023-11-01 DIAGNOSIS — R1012 Left upper quadrant pain: Secondary | ICD-10-CM | POA: Diagnosis not present

## 2023-11-01 LAB — BMP8+EGFR
BUN/Creatinine Ratio: 19 (ref 12–28)
BUN: 15 mg/dL (ref 8–27)
CO2: 26 mmol/L (ref 20–29)
Calcium: 9.7 mg/dL (ref 8.7–10.3)
Chloride: 101 mmol/L (ref 96–106)
Creatinine, Ser: 0.81 mg/dL (ref 0.57–1.00)
Glucose: 87 mg/dL (ref 70–99)
Potassium: 4.9 mmol/L (ref 3.5–5.2)
Sodium: 140 mmol/L (ref 134–144)
eGFR: 78 mL/min/{1.73_m2} (ref >59)

## 2023-11-01 LAB — MAGNESIUM: Magnesium: 2.3 mg/dL (ref 1.6–2.3)

## 2023-11-01 NOTE — Telephone Encounter (Signed)
 Printed and will update in the patients chart.

## 2023-11-01 NOTE — Progress Notes (Signed)
Perfect labs.

## 2023-11-05 NOTE — Addendum Note (Signed)
 Addended by: Latanya Presser on: 11/05/2023 04:33 PM   Modules accepted: Orders

## 2023-11-07 ENCOUNTER — Encounter: Payer: Self-pay | Admitting: Physician Assistant

## 2023-11-07 ENCOUNTER — Ambulatory Visit: Payer: Self-pay

## 2023-11-07 NOTE — Telephone Encounter (Signed)
 Copied from CRM [id number]. Topic: Clinical - Red Word Triage >> Nov 07, 2023  9:07 AM Izetta Dakin wrote: [facility] that prompted transfer to Nurse Triage: Severe pain, nausea, spleen  Chief Complaint: Pt. States she went to [facility] for her ultrasound and walked out "because it was not clean, there were charts laying around everywhere. I need to be rescheduled somewhere else today." Symptoms: Above Frequency: Today Pertinent Negatives: Patient denies  Disposition: [] ED /[] Urgent Care (no appt availability in office) / [] Appointment(In office/virtual)/ []  [facility]/ [] Home Care/ [] Refused Recommended Disposition /[] [place]/ [x]  Follow-up with PCP Additional Notes: Please advise pt.  Reason for Disposition  [1] MILD-MODERATE pain AND [2] constant AND [3] present > 2 hours  Answer Assessment - Initial Assessment Questions 1. LOCATION: "Where does it hurt?"      Under ribs 2. RADIATION: "Does the pain shoot anywhere else?" (e.g., chest, back)     No 3. ONSET: "When did the pain begin?" (e.g., minutes, hours or days ago)      Weeks ago 4. SUDDEN: "Gradual or sudden onset?"     Gradual 5. PATTERN "Does the pain come and go, or is it constant?"    - If it comes and goes: "How long does it last?" "Do you have pain now?"     (Note: Comes and goes means the pain is intermittent. It goes away completely between bouts.)    - If constant: "Is it getting better, staying the same, or getting worse?"      (Note: Constant means the pain never goes away completely; most serious pain is constant and gets worse.)      Comes and goes 6. SEVERITY: "How bad is the pain?"  (e.g., Scale 1-10; mild, moderate, or severe)    - MILD (1-3): Doesn't interfere with normal activities, abdomen soft and not tender to touch.     - MODERATE (4-7): Interferes with normal activities or awakens from sleep, abdomen tender to touch.     - SEVERE (8-10): Excruciating pain, doubled over,  unable to do any normal activities.       Now - 7 7. RECURRENT SYMPTOM: "Have you ever had this type of stomach pain before?" If Yes, ask: "When was the last time?" and "What happened that time?"      Yes 8. CAUSE: "What do you think is causing the stomach pain?"     Spleen 9. RELIEVING/AGGRAVATING FACTORS: "What makes it better or worse?" (e.g., antacids, bending or twisting motion, bowel movement)     No 10. OTHER SYMPTOMS: "Do you have any other symptoms?" (e.g., back pain, diarrhea, fever, urination pain, vomiting)       Nausea 11. PREGNANCY: "Is there any chance you are pregnant?" "When was your last menstrual period?"       No  Protocols used: Abdominal Pain - [facility]

## 2023-11-08 ENCOUNTER — Other Ambulatory Visit: Payer: Self-pay | Admitting: Physician Assistant

## 2023-11-08 DIAGNOSIS — I728 Aneurysm of other specified arteries: Secondary | ICD-10-CM

## 2023-11-08 DIAGNOSIS — Q8909 Congenital malformations of spleen: Secondary | ICD-10-CM

## 2023-11-11 ENCOUNTER — Encounter: Payer: Self-pay | Admitting: Physician Assistant

## 2023-11-14 ENCOUNTER — Encounter: Payer: Self-pay | Admitting: Physician Assistant

## 2023-11-14 ENCOUNTER — Ambulatory Visit

## 2023-11-14 DIAGNOSIS — I728 Aneurysm of other specified arteries: Secondary | ICD-10-CM

## 2023-11-14 DIAGNOSIS — R109 Unspecified abdominal pain: Secondary | ICD-10-CM | POA: Diagnosis not present

## 2023-11-14 DIAGNOSIS — Q8909 Congenital malformations of spleen: Secondary | ICD-10-CM

## 2023-11-15 ENCOUNTER — Encounter: Payer: Self-pay | Admitting: Physician Assistant

## 2023-11-15 NOTE — Progress Notes (Signed)
 Your spleen is normal in size! GREAT news.

## 2023-11-15 NOTE — Telephone Encounter (Signed)
 Jamie [name],  Please see mychart message along with attachment from pt and advise.

## 2023-11-19 ENCOUNTER — Other Ambulatory Visit

## 2023-11-19 ENCOUNTER — Ambulatory Visit
Admission: RE | Admit: 2023-11-19 | Discharge: 2023-11-19 | Disposition: A | Discharge status: Home / Self Care | Source: Ambulatory Visit | Attending: Physician Assistant | Admitting: Physician Assistant

## 2023-11-19 ENCOUNTER — Ambulatory Visit: Admitting: Physician Assistant

## 2023-11-19 ENCOUNTER — Other Ambulatory Visit: Payer: Self-pay | Admitting: Physician Assistant

## 2023-11-19 ENCOUNTER — Encounter: Payer: Self-pay | Admitting: Physician Assistant

## 2023-11-19 VITALS — BP 110/51 | HR 72 | Ht 64.0 in | Wt 140.0 lb

## 2023-11-19 DIAGNOSIS — N6320 Unspecified lump in the left breast, unspecified quadrant: Secondary | ICD-10-CM

## 2023-11-19 DIAGNOSIS — K5909 Other constipation: Secondary | ICD-10-CM | POA: Diagnosis not present

## 2023-11-19 DIAGNOSIS — R1013 Epigastric pain: Secondary | ICD-10-CM

## 2023-11-19 DIAGNOSIS — R8290 Unspecified abnormal findings in urine: Secondary | ICD-10-CM

## 2023-11-19 DIAGNOSIS — R1011 Right upper quadrant pain: Secondary | ICD-10-CM | POA: Diagnosis not present

## 2023-11-19 DIAGNOSIS — N644 Mastodynia: Secondary | ICD-10-CM

## 2023-11-19 DIAGNOSIS — N6325 Unspecified lump in the left breast, overlapping quadrants: Secondary | ICD-10-CM | POA: Diagnosis not present

## 2023-11-19 LAB — POCT URINALYSIS DIP (CLINITEK)
Bilirubin, UA: NEGATIVE
Blood, UA: NEGATIVE
Glucose, UA: NEGATIVE mg/dL
Ketones, POC UA: NEGATIVE mg/dL
Nitrite, UA: NEGATIVE
POC PROTEIN,UA: NEGATIVE
Spec Grav, UA: 1.02 (ref 1.010–1.025)
Urobilinogen, UA: 0.2 U/dL
pH, UA: 6 (ref 5.0–8.0)
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
## 2023-11-19 NOTE — Patient Instructions (Signed)
 Start linzess and protonix daily for 2 weeks then follow up.  Ordering CT of abdomen.

## 2023-11-21 ENCOUNTER — Encounter: Payer: Self-pay | Admitting: Physician Assistant

## 2023-11-21 LAB — URINE CULTURE

## 2023-11-21 NOTE — Progress Notes (Signed)
 No bacteria in urine culture that is abnormal.

## 2023-11-25 ENCOUNTER — Encounter: Payer: Self-pay | Admitting: Physician Assistant

## 2023-11-27 ENCOUNTER — Ambulatory Visit

## 2023-11-27 ENCOUNTER — Encounter: Payer: Self-pay | Admitting: Physician Assistant

## 2023-11-27 DIAGNOSIS — R110 Nausea: Secondary | ICD-10-CM | POA: Diagnosis not present

## 2023-11-27 DIAGNOSIS — R1013 Epigastric pain: Secondary | ICD-10-CM

## 2023-11-27 DIAGNOSIS — R1011 Right upper quadrant pain: Secondary | ICD-10-CM

## 2023-11-27 NOTE — Progress Notes (Signed)
Normal us

## 2023-12-03 ENCOUNTER — Encounter: Payer: Self-pay | Admitting: Physician Assistant

## 2023-12-03 NOTE — Addendum Note (Signed)
 Addended by: Araceli Knight on: 12/03/2023 07:10 AM   Modules accepted: Orders

## 2023-12-13 DIAGNOSIS — M9904 Segmental and somatic dysfunction of sacral region: Secondary | ICD-10-CM | POA: Diagnosis not present

## 2023-12-13 DIAGNOSIS — M9903 Segmental and somatic dysfunction of lumbar region: Secondary | ICD-10-CM | POA: Diagnosis not present

## 2023-12-13 DIAGNOSIS — M5413 Radiculopathy, cervicothoracic region: Secondary | ICD-10-CM | POA: Diagnosis not present

## 2023-12-13 DIAGNOSIS — M9901 Segmental and somatic dysfunction of cervical region: Secondary | ICD-10-CM | POA: Diagnosis not present

## 2023-12-13 DIAGNOSIS — M5417 Radiculopathy, lumbosacral region: Secondary | ICD-10-CM | POA: Diagnosis not present

## 2023-12-13 DIAGNOSIS — M62830 Muscle spasm of back: Secondary | ICD-10-CM | POA: Diagnosis not present

## 2023-12-19 ENCOUNTER — Encounter: Payer: Self-pay | Admitting: Physician Assistant

## 2023-12-19 DIAGNOSIS — M9903 Segmental and somatic dysfunction of lumbar region: Secondary | ICD-10-CM | POA: Diagnosis not present

## 2023-12-19 DIAGNOSIS — M62830 Muscle spasm of back: Secondary | ICD-10-CM | POA: Diagnosis not present

## 2023-12-19 DIAGNOSIS — M9904 Segmental and somatic dysfunction of sacral region: Secondary | ICD-10-CM | POA: Diagnosis not present

## 2023-12-19 DIAGNOSIS — M5417 Radiculopathy, lumbosacral region: Secondary | ICD-10-CM | POA: Diagnosis not present

## 2023-12-19 DIAGNOSIS — M9901 Segmental and somatic dysfunction of cervical region: Secondary | ICD-10-CM | POA: Diagnosis not present

## 2023-12-19 DIAGNOSIS — M5413 Radiculopathy, cervicothoracic region: Secondary | ICD-10-CM | POA: Diagnosis not present

## 2023-12-26 DIAGNOSIS — H04123 Dry eye syndrome of bilateral lacrimal glands: Secondary | ICD-10-CM | POA: Diagnosis not present

## 2023-12-26 DIAGNOSIS — H25813 Combined forms of age-related cataract, bilateral: Secondary | ICD-10-CM | POA: Diagnosis not present

## 2023-12-26 DIAGNOSIS — H40033 Anatomical narrow angle, bilateral: Secondary | ICD-10-CM | POA: Diagnosis not present

## 2023-12-27 ENCOUNTER — Encounter: Payer: Self-pay | Admitting: Physician Assistant

## 2023-12-31 ENCOUNTER — Ambulatory Visit: Payer: Self-pay | Admitting: Physician Assistant

## 2023-12-31 ENCOUNTER — Ambulatory Visit
Admission: RE | Admit: 2023-12-31 | Discharge: 2023-12-31 | Disposition: A | Discharge status: Home / Self Care | Source: Ambulatory Visit | Attending: Physician Assistant | Admitting: Physician Assistant

## 2023-12-31 ENCOUNTER — Other Ambulatory Visit: Payer: Self-pay | Admitting: Physician Assistant

## 2023-12-31 DIAGNOSIS — N6320 Unspecified lump in the left breast, unspecified quadrant: Secondary | ICD-10-CM

## 2023-12-31 DIAGNOSIS — N6325 Unspecified lump in the left breast, overlapping quadrants: Secondary | ICD-10-CM | POA: Diagnosis not present

## 2023-12-31 DIAGNOSIS — N641 Fat necrosis of breast: Secondary | ICD-10-CM

## 2023-12-31 NOTE — Progress Notes (Signed)
 Appearance of benign breast cyst. Follow up in 6 months.

## 2024-01-14 ENCOUNTER — Encounter: Payer: Self-pay | Admitting: Gastroenterology

## 2024-02-25 ENCOUNTER — Ambulatory Visit

## 2024-02-25 ENCOUNTER — Other Ambulatory Visit: Payer: Self-pay

## 2024-02-25 DIAGNOSIS — E7849 Other hyperlipidemia: Secondary | ICD-10-CM

## 2024-02-25 DIAGNOSIS — E538 Deficiency of other specified B group vitamins: Secondary | ICD-10-CM | POA: Diagnosis not present

## 2024-02-25 DIAGNOSIS — R109 Unspecified abdominal pain: Secondary | ICD-10-CM

## 2024-02-25 DIAGNOSIS — G43101 Migraine with aura, not intractable, with status migrainosus: Secondary | ICD-10-CM | POA: Diagnosis not present

## 2024-02-25 DIAGNOSIS — E559 Vitamin D deficiency, unspecified: Secondary | ICD-10-CM | POA: Diagnosis not present

## 2024-02-26 ENCOUNTER — Ambulatory Visit: Payer: Self-pay | Admitting: Physician Assistant

## 2024-02-26 LAB — CBC WITH DIFFERENTIAL/PLATELET
Basophils Absolute: 0 x10E3/uL (ref 0.0–0.2)
Basos: 1 %
EOS (ABSOLUTE): 0 x10E3/uL (ref 0.0–0.4)
Eos: 1 %
Hematocrit: 39.7 % (ref 34.0–46.6)
Hemoglobin: 13 g/dL (ref 11.1–15.9)
Immature Grans (Abs): 0 x10E3/uL (ref 0.0–0.1)
Immature Granulocytes: 0 %
Lymphocytes Absolute: 1.5 x10E3/uL (ref 0.7–3.1)
Lymphs: 32 %
MCH: 31.2 pg (ref 26.6–33.0)
MCHC: 32.7 g/dL (ref 31.5–35.7)
MCV: 95 fL (ref 79–97)
Monocytes Absolute: 0.5 x10E3/uL (ref 0.1–0.9)
Monocytes: 10 %
Neutrophils Absolute: 2.6 x10E3/uL (ref 1.4–7.0)
Neutrophils: 56 %
Platelets: 288 x10E3/uL (ref 150–450)
RBC: 4.17 x10E6/uL (ref 3.77–5.28)
RDW: 12.3 % (ref 11.7–15.4)
WBC: 4.6 x10E3/uL (ref 3.4–10.8)

## 2024-02-26 LAB — LIPASE: Lipase: 42 U/L (ref 14–85)

## 2024-02-26 LAB — LIPID PANEL
Chol/HDL Ratio: 3.3 ratio (ref 0.0–4.4)
Cholesterol, Total: 264 mg/dL — ABNORMAL HIGH (ref 100–199)
HDL: 79 mg/dL (ref >39)
LDL Chol Calc (NIH): 165 mg/dL — ABNORMAL HIGH (ref 0–99)
Triglycerides: 117 mg/dL (ref 0–149)
VLDL Cholesterol Cal: 20 mg/dL (ref 5–40)

## 2024-02-26 LAB — CMP14+EGFR
ALT: 15 IU/L (ref 0–32)
AST: 21 IU/L (ref 0–40)
Albumin: 4.4 g/dL (ref 3.8–4.8)
Alkaline Phosphatase: 69 IU/L (ref 44–121)
BUN/Creatinine Ratio: 21 (ref 12–28)
BUN: 19 mg/dL (ref 8–27)
Bilirubin Total: 0.4 mg/dL (ref 0.0–1.2)
CO2: 22 mmol/L (ref 20–29)
Calcium: 9.4 mg/dL (ref 8.7–10.3)
Chloride: 102 mmol/L (ref 96–106)
Creatinine, Ser: 0.89 mg/dL (ref 0.57–1.00)
Globulin, Total: 2.3 g/dL (ref 1.5–4.5)
Glucose: 93 mg/dL (ref 70–99)
Potassium: 5.1 mmol/L (ref 3.5–5.2)
Sodium: 143 mmol/L (ref 134–144)
Total Protein: 6.7 g/dL (ref 6.0–8.5)
eGFR: 69 mL/min/1.73 (ref >59)

## 2024-02-26 MED ORDER — ROSUVASTATIN CALCIUM 5 MG PO TABS: 5.0000 mg | ORAL_TABLET | Freq: Every day | ORAL | 3 refills | Status: AC

## 2024-02-26 NOTE — Progress Notes (Signed)
 Jamie [name],   Lipase normal.  Hemoglobin and WBC look great.  Kidney, liver, glucose look good.   Cholesterol not to goal. I would suggest starting a statin to help lower cholesterol. Thoughts?   SABRASABRAThe 10-year ASCVD risk score (Arnett DK, et al., 2019) is: 7.7%   Values used to calculate the score:     Age: 73 years     Clincally relevant sex: Female     Is Non-Hispanic African American: No     Diabetic: No     Tobacco smoker: No     Systolic Blood Pressure: 107 mmHg     Is BP treated: No     HDL Cholesterol: 79 mg/dL     Total Cholesterol: 264 mg/dL

## 2024-02-26 NOTE — Progress Notes (Signed)
 Can any or all of these labs be added on?

## 2024-02-26 NOTE — Progress Notes (Signed)
 Sent crestor  to take at bedtime. Recheck in 6 months.

## 2024-02-29 LAB — VITAMIN D 25 HYDROXY (VIT D DEFICIENCY, FRACTURES): Vit D, 25-Hydroxy: 42.9 ng/mL (ref 30.0–100.0)

## 2024-02-29 LAB — MAGNESIUM: Magnesium: 2.4 mg/dL — ABNORMAL HIGH (ref 1.6–2.3)

## 2024-02-29 LAB — VITAMIN B12: Vitamin B-12: 143 pg/mL — ABNORMAL LOW (ref 232–1245)

## 2024-02-29 LAB — SPECIMEN STATUS REPORT

## 2024-03-02 NOTE — Progress Notes (Signed)
 You could cut back on magnesium  just a little. How much are you taking?   B12 is low. Are you taking any b12 right now? I think it would be a good idea to start monthly shots for the next 3 months.   Vitamin D  looks good.

## 2024-03-03 ENCOUNTER — Ambulatory Visit (INDEPENDENT_AMBULATORY_CARE_PROVIDER_SITE_OTHER)

## 2024-03-03 ENCOUNTER — Ambulatory Visit: Payer: Self-pay | Admitting: Physician Assistant

## 2024-03-03 ENCOUNTER — Other Ambulatory Visit: Payer: Self-pay

## 2024-03-03 ENCOUNTER — Ambulatory Visit

## 2024-03-03 VITALS — BP 121/55 | HR 70 | Ht 64.0 in | Wt 144.4 lb

## 2024-03-03 DIAGNOSIS — E538 Deficiency of other specified B group vitamins: Secondary | ICD-10-CM | POA: Diagnosis not present

## 2024-03-03 DIAGNOSIS — R300 Dysuria: Secondary | ICD-10-CM | POA: Diagnosis not present

## 2024-03-03 DIAGNOSIS — F419 Anxiety disorder, unspecified: Secondary | ICD-10-CM

## 2024-03-03 LAB — POCT URINALYSIS DIP (CLINITEK)
Bilirubin, UA: NEGATIVE
Blood, UA: NEGATIVE
Glucose, UA: NEGATIVE mg/dL
Ketones, POC UA: NEGATIVE mg/dL
Leukocytes, UA: NEGATIVE
Nitrite, UA: NEGATIVE
POC PROTEIN,UA: NEGATIVE
Spec Grav, UA: 1.02 (ref 1.010–1.025)
Urobilinogen, UA: 0.2 U/dL
pH, UA: 5.5 (ref 5.0–8.0)

## 2024-03-03 MED ORDER — CYANOCOBALAMIN 1000 MCG/ML IJ SOLN
1000.0000 ug | Freq: Once | INTRAMUSCULAR | Status: AC
  Administered 2024-03-03: 500 ug via INTRAMUSCULAR

## 2024-03-03 MED ORDER — ALPRAZOLAM 0.5 MG PO TABS: ORAL_TABLET | ORAL | 0 refills | Status: DC

## 2024-03-03 NOTE — Patient Instructions (Signed)
Return in 30 days for next B12 injection as nurse visit.

## 2024-03-03 NOTE — Progress Notes (Signed)
   Established Patient Office Visit  Subjective   Patient ID: Jamie [name], female    DOB: [birth year]  Age: 73 y.o. MRN: [id number]  Chief Complaint  Patient presents with   B12 deficiency    B12 injection nurse visit.    Dysuria    Urinalysis and urine culture - nurse visit.     HPI  B12 deficiency- B12 injection nurse visit.  Initial restart of B12 injections to be administered monthly for the next 3 months.  Patient had stated that  she had a severe headache with 1000mcg B12 injection last time that she almost went to ER for . Per Jamie Bologna, PA  approved to administer 0.5ml ( 500mcg ) cyanocobalamin  today . Patient also c/o urinary pain, odor,  x 5 days - Per Jamie Bologna, PA can do a urinalyis and culture as nurse visit.  Patient states she stopped Linzess  due to elevated magnesium  on recent blood work she would like to know what to use for constipation before leaving the office today.  She also requesting rx rf of Xanax  (this was pended in separate message for provider to review) and states that she is having left sided abdomen pain that travels across abdomen x 3 to 4 days and wonders if could be diverticulitis flare up -patient will schedule a visit with the provider to evaluate this before leaving office today.   ROS    Objective:     BP (!) 121/55   Pulse 70   Ht 5' 4 (1.626 m)   Wt 144 lb 6 oz (65.5 kg)   SpO2 100%   BMI 24.78 kg/m    Physical Exam   Results for orders placed or performed in visit on 03/03/24  POCT URINALYSIS DIP (CLINITEK)  Result Value Ref Range   Color, UA yellow yellow   Clarity, UA clear clear   Glucose, UA negative negative mg/dL   Bilirubin, UA negative negative   Ketones, POC UA negative negative mg/dL   Spec Grav, UA 8.979 8.989 - 1.025   Blood, UA negative negative   pH, UA 5.5 5.0 - 8.0   POC PROTEIN,UA negative negative, trace   Urobilinogen, UA 0.2 0.2 or 1.0 E.U./dL   Nitrite, UA Negative Negative   Leukocytes, UA  Negative Negative      The 10-year ASCVD risk score (Arnett DK, et al., 2019) is: 9.7%    Assessment & Plan:  Admin cyanocobalamin  500mg  IM Left deltoid. Patient tolerated injection well without complications. Return in 30 days for next B12 injection as nurse visit. Jamie Bologna, PA recommended Miralax   OTC- one capful daily with water  of juice to address constipation.  Will send urine for urine culture  Problem List Items Addressed This Visit       Other   B12 deficiency   Anxiety   Other Visit Diagnoses       Dysuria    -  Primary   Relevant Orders   POCT URINALYSIS DIP (CLINITEK) (Completed)   Urine Culture       Return in about 30 days (around 04/02/2024) for next B12 injection as nurse visit. Jamie Suzen SHAUNNA Alpheus, LPN

## 2024-03-03 NOTE — Telephone Encounter (Signed)
 Patient requesting rx rf of  alprazolam  0.5mg   Last written 10/01/2023 Last OV 11/19/2023 Upcoming appt 04/01/2024

## 2024-03-03 NOTE — Progress Notes (Signed)
 Normal UA. Will culture.

## 2024-03-05 LAB — URINE CULTURE

## 2024-03-05 LAB — SPECIMEN STATUS REPORT

## 2024-03-06 NOTE — Progress Notes (Signed)
Normal urine culture

## 2024-03-11 ENCOUNTER — Ambulatory Visit: Admitting: Gastroenterology

## 2024-04-01 ENCOUNTER — Encounter: Payer: Self-pay | Admitting: Physician Assistant

## 2024-04-01 ENCOUNTER — Encounter: Admitting: Physician Assistant

## 2024-04-02 ENCOUNTER — Encounter: Payer: Self-pay | Admitting: Physician Assistant

## 2024-04-02 ENCOUNTER — Other Ambulatory Visit

## 2024-04-06 ENCOUNTER — Encounter: Payer: Self-pay | Admitting: Physician Assistant

## 2024-04-07 ENCOUNTER — Ambulatory Visit: Admitting: Physician Assistant

## 2024-04-07 ENCOUNTER — Encounter: Payer: Self-pay | Admitting: Physician Assistant

## 2024-04-07 VITALS — BP 120/51 | HR 71 | Temp 98.5°F | Ht 64.0 in | Wt 144.0 lb

## 2024-04-07 DIAGNOSIS — R5381 Other malaise: Secondary | ICD-10-CM

## 2024-04-07 DIAGNOSIS — N3000 Acute cystitis without hematuria: Secondary | ICD-10-CM | POA: Diagnosis not present

## 2024-04-07 DIAGNOSIS — R5383 Other fatigue: Secondary | ICD-10-CM | POA: Diagnosis not present

## 2024-04-07 DIAGNOSIS — R300 Dysuria: Secondary | ICD-10-CM

## 2024-04-07 DIAGNOSIS — R790 Abnormal level of blood mineral: Secondary | ICD-10-CM

## 2024-04-07 DIAGNOSIS — E538 Deficiency of other specified B group vitamins: Secondary | ICD-10-CM

## 2024-04-07 LAB — POCT URINALYSIS DIP (CLINITEK)
Bilirubin, UA: NEGATIVE
Blood, UA: NEGATIVE
Glucose, UA: NEGATIVE mg/dL
Ketones, POC UA: NEGATIVE mg/dL
Nitrite, UA: NEGATIVE
POC PROTEIN,UA: NEGATIVE
Spec Grav, UA: <=1.005 — AB (ref 1.010–1.025)
Urobilinogen, UA: 0.2 U/dL
pH, UA: 6 (ref 5.0–8.0)

## 2024-04-07 LAB — POC SOFIA 2 FLU + SARS ANTIGEN FIA
Influenza A, POC: NEGATIVE
Influenza B, POC: NEGATIVE
SARS Coronavirus 2 Ag: NEGATIVE

## 2024-04-07 MED ORDER — CYANOCOBALAMIN 1000 MCG/ML IJ SOLN
500.0000 ug | Freq: Once | INTRAMUSCULAR | Status: AC
  Administered 2024-04-07: 500 ug via INTRAMUSCULAR

## 2024-04-07 MED ORDER — AMOXICILLIN-POT CLAVULANATE 500-125 MG PO TABS: 1.0000 | ORAL_TABLET | Freq: Three times a day (TID) | ORAL | 0 refills | Status: DC

## 2024-04-07 NOTE — Progress Notes (Unsigned)
 Acute Office Visit  Subjective:     Patient ID: Jamie [name], female    DOB: 1951-06-24, 73 y.o.   MRN: [id number]   HPI Pt is a 73 yo female who presents to the clinic with dysuria for the last week. Pt has hx of UTIS. She denies any fever, chills, body aches, nausea, vomiting, diarrhea. She is having constipation but not wanting to take miralax  due to her magnesium  being high. She wonders if she can take her linzess .   She has low b12 and she needs her b12 shot.   She has been feeling fatigued and just ugh for the last few days. She wants to make sure she does not have covid or flu. No other sick contacts.   ROS See HPI.      Objective:    BP (!) 120/51   Pulse 71   Temp 98.5 F (36.9 C) (Oral)   Ht 5' 4 (1.626 m)   Wt 144 lb (65.3 kg)   SpO2 99%   BMI 24.72 kg/m  BP Readings from Last 3 Encounters:  04/07/24 (!) 120/51  03/03/24 (!) 121/55  11/19/23 (!) 110/51   Wt Readings from Last 3 Encounters:  04/07/24 144 lb (65.3 kg)  03/03/24 144 lb 6 oz (65.5 kg)  11/19/23 140 lb (63.5 kg)    .Jamie Results for orders placed or performed in visit on 04/07/24  Urine Culture   Collection Time: 04/07/24 11:26 AM   Specimen: Urine   Urine  Result Value Ref Range   Urine Culture, Routine Preliminary report (A)    Organism ID, Bacteria Gram negative rods (A)   POCT URINALYSIS DIP (CLINITEK)   Collection Time: 04/07/24 11:53 AM  Result Value Ref Range   Color, UA yellow yellow   Clarity, UA cloudy (A) clear   Glucose, UA negative negative mg/dL   Bilirubin, UA negative negative   Ketones, POC UA negative negative mg/dL   Spec Grav, UA <=8.994 (A) 1.010 - 1.025   Blood, UA negative negative   pH, UA 6.0 5.0 - 8.0   POC PROTEIN,UA negative negative, trace   Urobilinogen, UA 0.2 0.2 or 1.0 E.U./dL   Nitrite, UA Negative Negative   Leukocytes, UA Moderate (2+) (A) Negative  POC SOFIA 2 FLU + SARS ANTIGEN FIA   Collection Time: 04/07/24 11:54 AM  Result Value Ref  Range   Influenza A, POC Negative Negative   Influenza B, POC Negative Negative   SARS Coronavirus 2 Ag Negative Negative  CBC w/Diff/Platelet   Collection Time: 04/07/24 12:09 PM  Result Value Ref Range   WBC 5.6 3.4 - 10.8 x10E3/uL   RBC 4.20 3.77 - 5.28 x10E6/uL   Hemoglobin 13.2 11.1 - 15.9 g/dL   Hematocrit 60.5 65.9 - 46.6 %   MCV 94 79 - 97 fL   MCH 31.4 26.6 - 33.0 pg   MCHC 33.5 31.5 - 35.7 g/dL   RDW 87.8 88.2 - 84.5 %   Platelets 286 150 - 450 x10E3/uL   Neutrophils 70 Not Estab. %   Lymphs 20 Not Estab. %   Monocytes 8 Not Estab. %   Eos 1 Not Estab. %   Basos 1 Not Estab. %   Neutrophils Absolute 3.9 1.4 - 7.0 x10E3/uL   Lymphocytes Absolute 1.1 0.7 - 3.1 x10E3/uL   Monocytes Absolute 0.5 0.1 - 0.9 x10E3/uL   EOS (ABSOLUTE) 0.1 0.0 - 0.4 x10E3/uL   Basophils Absolute 0.1 0.0 - 0.2 x10E3/uL  Immature Granulocytes 0 Not Estab. %   Immature Grans (Abs) 0.0 0.0 - 0.1 x10E3/uL  BMP8+eGFR   Collection Time: 04/07/24 12:09 PM  Result Value Ref Range   Glucose 98 70 - 99 mg/dL   BUN 14 8 - 27 mg/dL   Creatinine, Ser 9.11 0.57 - 1.00 mg/dL   eGFR 70 >40 fO/fpw/8.26   BUN/Creatinine Ratio 16 12 - 28   Sodium 142 134 - 144 mmol/L   Potassium 4.8 3.5 - 5.2 mmol/L   Chloride 102 96 - 106 mmol/L   CO2 25 20 - 29 mmol/L   Calcium  9.7 8.7 - 10.3 mg/dL  Magnesium    Collection Time: 04/07/24 12:09 PM  Result Value Ref Range   Magnesium  2.5 (H) 1.6 - 2.3 mg/dL   *Note: Due to a large number of results and/or encounters for the requested time period, some results have not been displayed. A complete set of results can be found in Results Review.     Physical Exam Constitutional:      Appearance: Normal appearance.  HENT:     Head: Normocephalic.  Cardiovascular:     Rate and Rhythm: Normal rate and regular rhythm.  Pulmonary:     Effort: Pulmonary effort is normal.     Breath sounds: Normal breath sounds.  Abdominal:     General: There is no distension.      Palpations: Abdomen is soft. There is no mass.     Tenderness: There is no abdominal tenderness. There is no right CVA tenderness, left CVA tenderness, guarding or rebound.  Neurological:     Mental Status: She is alert.  Psychiatric:        Mood and Affect: Mood normal.          Assessment & Plan:  SABRASABRADalayla [name] was seen today for dysuria.  Diagnoses and all orders for this visit:  Acute cystitis without hematuria -     amoxicillin -clavulanate (AUGMENTIN ) 500-125 MG tablet; Take 1 tablet by mouth 3 (three) times daily.  Dysuria -     POCT URINALYSIS DIP (CLINITEK) -     Urine Culture -     CBC w/Diff/Platelet  B12 deficiency -     cyanocobalamin  (VITAMIN B12) injection 500 mcg  Malaise and fatigue -     CBC w/Diff/Platelet -     BMP8+eGFR -     POC SOFIA 2 FLU + SARS ANTIGEN FIA  Low magnesium  level -     BMP8+eGFR -     Magnesium    UA dipstick moderate leukocytes Will culture Due to symptoms patient would like to start abx Sent amoxicillin  to pharmacy to start Vitals look great B12 injection given today Will recheck magnesium  level Encouraged to take linzess  for constipation Negative for covid/flu/strep Could be fighting off another infection Cbc ordered Stay hydrated and rest Follow up as needed if symptoms worsen or persist.  Jamie Bologna, PA-C

## 2024-04-07 NOTE — Patient Instructions (Signed)
 Use linzess  or constipation Start augmentin  for UTI Get labs  Urinary Tract Infection, Female A urinary tract infection (UTI) is an infection in your urinary tract. The urinary tract is made up of organs that make, store, and get rid of pee (urine) in your body. These organs include: The kidneys. The ureters. The bladder. The urethra. What are the causes? Most UTIs are caused by germs called bacteria. They may be in or near your genitals. These germs grow and cause swelling in your urinary tract. What increases the risk? You're more likely to get a UTI if: You're a female. The urethra is shorter in females than in males. You have a soft tube called a catheter that drains your pee. You can't control when you pee or poop. You have trouble peeing because of: A kidney stone. A urinary blockage. A nerve condition that affects your bladder. Not getting enough to drink. You're sexually active. You use a birth control inside your vagina, like spermicide. You're pregnant. You have low levels of the hormone estrogen in your body. You're an older adult. You're also more likely to get a UTI if you have other health problems. These may include: Diabetes. A weak immune system. Your immune system is your body's defense system. Sickle cell disease. Injury of the spine. What are the signs or symptoms? Symptoms may include: Needing to pee right away. Peeing small amounts often. Pain or burning when you pee. Blood in your pee. Pee that smells bad or odd. Pain in your belly or lower back. You may also: Feel confused. This may be the first symptom in older adults. Vomit. Not feel hungry. Feel tired or easily annoyed. Have a fever or chills. How is this diagnosed? A UTI is diagnosed based on your medical history and an exam. You may also have other tests. These may include: Pee tests. Blood tests. Tests for sexually transmitted infections (STIs). If you've had more than one UTI, you may  need to have imaging studies done to find out why you keep getting them. How is this treated? A UTI can be treated by: Taking antibiotics or other medicines. Drinking enough fluid to keep your pee pale yellow. In rare cases, a UTI can cause a very bad condition called sepsis. Sepsis may be treated in the hospital. Follow these instructions at home: Medicines Take your medicines only as told by your health care provider. If you were given antibiotics, take them as told by your provider. Do not stop taking them even if you start to feel better. General instructions Make sure you: Pee often and fully. Do not hold your pee for a long time. Wipe from front to back after you pee or poop. Use each tissue only once when you wipe. Pee after you have sex. Do not douche or use sprays or powders in your genital area. Contact a health care provider if: Your symptoms don't get better after 1-2 days of taking antibiotics. Your symptoms go away and then come back. You have a fever or chills. You vomit or feel like you may vomit. Get help right away if: You have very bad pain in your back or lower belly. You faint. This information is not intended to replace advice given to you by your health care provider. Make sure you discuss any questions you have with your health care provider. Document Revised: 07/10/2023 Document Reviewed: 11/02/2022 Elsevier Patient Education  2025 [employer].

## 2024-04-08 ENCOUNTER — Ambulatory Visit
Admission: RE | Admit: 2024-04-08 | Discharge: 2024-04-08 | Disposition: A | Discharge status: Home / Self Care | Source: Ambulatory Visit | Attending: Physician Assistant | Admitting: Physician Assistant

## 2024-04-08 ENCOUNTER — Ambulatory Visit: Payer: Self-pay | Admitting: Physician Assistant

## 2024-04-08 ENCOUNTER — Other Ambulatory Visit: Payer: Self-pay | Admitting: Physician Assistant

## 2024-04-08 DIAGNOSIS — N6325 Unspecified lump in the left breast, overlapping quadrants: Secondary | ICD-10-CM | POA: Diagnosis not present

## 2024-04-08 DIAGNOSIS — N641 Fat necrosis of breast: Secondary | ICD-10-CM

## 2024-04-08 DIAGNOSIS — N6320 Unspecified lump in the left breast, unspecified quadrant: Secondary | ICD-10-CM

## 2024-04-08 DIAGNOSIS — R928 Other abnormal and inconclusive findings on diagnostic imaging of breast: Secondary | ICD-10-CM | POA: Diagnosis not present

## 2024-04-08 LAB — CBC WITH DIFFERENTIAL/PLATELET
Basophils Absolute: 0.1 x10E3/uL (ref 0.0–0.2)
Basos: 1 %
EOS (ABSOLUTE): 0.1 x10E3/uL (ref 0.0–0.4)
Eos: 1 %
Hematocrit: 39.4 % (ref 34.0–46.6)
Hemoglobin: 13.2 g/dL (ref 11.1–15.9)
Immature Grans (Abs): 0 x10E3/uL (ref 0.0–0.1)
Immature Granulocytes: 0 %
Lymphocytes Absolute: 1.1 x10E3/uL (ref 0.7–3.1)
Lymphs: 20 %
MCH: 31.4 pg (ref 26.6–33.0)
MCHC: 33.5 g/dL (ref 31.5–35.7)
MCV: 94 fL (ref 79–97)
Monocytes Absolute: 0.5 x10E3/uL (ref 0.1–0.9)
Monocytes: 8 %
Neutrophils Absolute: 3.9 x10E3/uL (ref 1.4–7.0)
Neutrophils: 70 %
Platelets: 286 x10E3/uL (ref 150–450)
RBC: 4.2 x10E6/uL (ref 3.77–5.28)
RDW: 12.1 % (ref 11.7–15.4)
WBC: 5.6 x10E3/uL (ref 3.4–10.8)

## 2024-04-08 LAB — BMP8+EGFR
BUN/Creatinine Ratio: 16 (ref 12–28)
BUN: 14 mg/dL (ref 8–27)
CO2: 25 mmol/L (ref 20–29)
Calcium: 9.7 mg/dL (ref 8.7–10.3)
Chloride: 102 mmol/L (ref 96–106)
Creatinine, Ser: 0.88 mg/dL (ref 0.57–1.00)
Glucose: 98 mg/dL (ref 70–99)
Potassium: 4.8 mmol/L (ref 3.5–5.2)
Sodium: 142 mmol/L (ref 134–144)
eGFR: 70 mL/min/1.73 (ref >59)

## 2024-04-08 LAB — MAGNESIUM: Magnesium: 2.5 mg/dL — ABNORMAL HIGH (ref 1.6–2.3)

## 2024-04-08 NOTE — Progress Notes (Signed)
 Helen,   Kidney, liver, glucose look good.  Normal WBC.  Magnesium  low. You can continue to take linzess  it does not have magnesium  in it.  Are there any supplements that you are taking that have magnesium  in them.

## 2024-04-10 ENCOUNTER — Encounter: Payer: Self-pay | Admitting: Physician Assistant

## 2024-04-10 NOTE — Progress Notes (Signed)
 There was some bacteria found but it is a very low colony count. We usually do not treat unless colony counter is over 100,000 and yours is 50,000.

## 2024-04-11 LAB — URINE CULTURE

## 2024-04-14 ENCOUNTER — Encounter: Payer: Self-pay | Admitting: Physician Assistant

## 2024-04-15 ENCOUNTER — Encounter: Admitting: Physician Assistant

## 2024-04-15 DIAGNOSIS — Z0000 Encounter for general adult medical examination without abnormal findings: Secondary | ICD-10-CM

## 2024-04-15 DIAGNOSIS — E538 Deficiency of other specified B group vitamins: Secondary | ICD-10-CM

## 2024-04-17 ENCOUNTER — Other Ambulatory Visit: Payer: Self-pay | Admitting: Physician Assistant

## 2024-04-17 DIAGNOSIS — F419 Anxiety disorder, unspecified: Secondary | ICD-10-CM

## 2024-04-18 ENCOUNTER — Encounter: Payer: Self-pay | Admitting: Physician Assistant

## 2024-04-20 NOTE — Telephone Encounter (Signed)
 ALPRAZOLAM  Last OV: 04/07/24 Next OV: 04/22/24 Last RF: 03/03/24

## 2024-04-22 ENCOUNTER — Ambulatory Visit: Admitting: Physician Assistant

## 2024-04-22 ENCOUNTER — Encounter: Payer: Self-pay | Admitting: Physician Assistant

## 2024-04-22 ENCOUNTER — Ambulatory Visit (INDEPENDENT_AMBULATORY_CARE_PROVIDER_SITE_OTHER): Admitting: Physician Assistant

## 2024-04-22 VITALS — BP 125/55 | HR 79 | Ht 64.0 in | Wt 150.0 lb

## 2024-04-22 DIAGNOSIS — Z87440 Personal history of urinary (tract) infections: Secondary | ICD-10-CM | POA: Diagnosis not present

## 2024-04-22 DIAGNOSIS — R350 Frequency of micturition: Secondary | ICD-10-CM

## 2024-04-22 DIAGNOSIS — E538 Deficiency of other specified B group vitamins: Secondary | ICD-10-CM

## 2024-04-22 DIAGNOSIS — E569 Vitamin deficiency, unspecified: Secondary | ICD-10-CM

## 2024-04-22 DIAGNOSIS — Z79899 Other long term (current) drug therapy: Secondary | ICD-10-CM

## 2024-04-22 DIAGNOSIS — N3000 Acute cystitis without hematuria: Secondary | ICD-10-CM

## 2024-04-22 LAB — POCT URINALYSIS DIP (CLINITEK)
Bilirubin, UA: NEGATIVE
Blood, UA: NEGATIVE
Glucose, UA: NEGATIVE mg/dL
Ketones, POC UA: NEGATIVE mg/dL
Leukocytes, UA: NEGATIVE
Nitrite, UA: NEGATIVE
POC PROTEIN,UA: NEGATIVE
Spec Grav, UA: 1.01 (ref 1.010–1.025)
Urobilinogen, UA: 0.2 U/dL
pH, UA: 5.5 (ref 5.0–8.0)

## 2024-04-22 NOTE — Progress Notes (Signed)
 Established Patient Office Visit  Subjective   Patient ID: Jamie [name], female    DOB: Jan 21, 1951  Age: 73 y.o. MRN: [id number]   HPI Pt is a 73 yo female who presents to the clinic to make sure she does not have urinary tract infection. She has had some frequent urination. No fever, chills, body aches, nausea or vomiting.   She would like ammonia level checked and b12 checked. She takes 500mcg b12 shots monthly. She would like her GFR checked as well.   ROS See HPI.    Objective:     BP (!) 125/55   Pulse 79   Ht 5' 4 (1.626 m)   Wt 150 lb (68 kg)   SpO2 99%   BMI 25.75 kg/m  BP Readings from Last 3 Encounters:  04/27/24 126/78  04/22/24 (!) 125/55  04/07/24 (!) 120/51   Wt Readings from Last 3 Encounters:  04/27/24 150 lb (68 kg)  04/22/24 150 lb (68 kg)  04/07/24 144 lb (65.3 kg)      Physical Exam Constitutional:      Appearance: Normal appearance.  HENT:     Head: Normocephalic.  Cardiovascular:     Rate and Rhythm: Normal rate and regular rhythm.     Heart sounds: Normal heart sounds.  Pulmonary:     Effort: Pulmonary effort is normal.     Breath sounds: Normal breath sounds.  Abdominal:     General: Bowel sounds are normal. There is no distension.     Palpations: Abdomen is soft. There is no mass.     Tenderness: There is no abdominal tenderness. There is no right CVA tenderness, left CVA tenderness, guarding or rebound.  Neurological:     Mental Status: She is alert.  Psychiatric:        Mood and Affect: Mood normal.      Results for orders placed or performed in visit on 04/22/24  Vitamin B1  Result Value Ref Range   Thiamine WILL FOLLOW   Vitamin B3  Result Value Ref Range   Nicotinamide WILL FOLLOW    Nicotinic Acid WILL FOLLOW   Vitamin B6  Result Value Ref Range   Vitamin B6 WILL FOLLOW   Phosphorus  Result Value Ref Range   Phosphorus 4.1 3.0 - 4.3 mg/dL  Results for orders placed or performed in visit on 04/22/24  Urine  Culture   Specimen: Urine   Urine  Result Value Ref Range   Urine Culture, Routine Final report    Organism ID, Bacteria Comment   B12 and Folate Panel  Result Value Ref Range   Vitamin B-12 290 232 - 1,245 pg/mL   Folate 13.7 >3.0 ng/mL  Ammonia  Result Value Ref Range   Ammonia 42 31 - 169 ug/dL  AFE1+zHQM  Result Value Ref Range   Glucose 125 (H) 70 - 99 mg/dL   BUN 17 8 - 27 mg/dL   Creatinine, Ser 9.13 0.57 - 1.00 mg/dL   eGFR 72 >40 fO/fpw/8.26   BUN/Creatinine Ratio 20 12 - 28   Sodium 142 134 - 144 mmol/L   Potassium 4.8 3.5 - 5.2 mmol/L   Chloride 103 96 - 106 mmol/L   CO2 23 20 - 29 mmol/L   Calcium  9.6 8.7 - 10.3 mg/dL  POCT URINALYSIS DIP (CLINITEK)  Result Value Ref Range   Color, UA yellow yellow   Clarity, UA clear clear   Glucose, UA negative negative mg/dL   Bilirubin, UA negative  negative   Ketones, POC UA negative negative mg/dL   Spec Grav, UA 8.989 8.989 - 1.025   Blood, UA negative negative   pH, UA 5.5 5.0 - 8.0   POC PROTEIN,UA negative negative, trace   Urobilinogen, UA 0.2 0.2 or 1.0 E.U./dL   Nitrite, UA Negative Negative   Leukocytes, UA Negative Negative     The 10-year ASCVD risk score (Arnett DK, et al., 2019) is: 10.4%    Assessment & Plan:  SABRASABRARiyanshi Wahab was seen today for dysuria.  Diagnoses and all orders for this visit:  Urinary frequency  B12 deficiency -     B12 and Folate Panel -     Ammonia -     BMP8+eGFR  History of UTI -     POCT URINALYSIS DIP (CLINITEK) -     Urine Culture -     BMP8+eGFR  Medication management -     B12 and Folate Panel -     Ammonia -     BMP8+eGFR  UA negative for blood, leukocytes, nitrites, protein.  Will culture and treat accordingly.  Follow up as needed.   Ordered requested labs.  Will follow up on b12 due to shots monthly.    Pt declined flu/covid/shingles/pneumonia vaccines.     Belladonna Lubinski, PA-C

## 2024-04-23 ENCOUNTER — Encounter: Payer: Self-pay | Admitting: Physician Assistant

## 2024-04-23 LAB — BMP8+EGFR
BUN/Creatinine Ratio: 20 (ref 12–28)
BUN: 17 mg/dL (ref 8–27)
CO2: 23 mmol/L (ref 20–29)
Calcium: 9.6 mg/dL (ref 8.7–10.3)
Chloride: 103 mmol/L (ref 96–106)
Creatinine, Ser: 0.86 mg/dL (ref 0.57–1.00)
Glucose: 125 mg/dL — ABNORMAL HIGH (ref 70–99)
Potassium: 4.8 mmol/L (ref 3.5–5.2)
Sodium: 142 mmol/L (ref 134–144)
eGFR: 72 mL/min/1.73 (ref >59)

## 2024-04-23 LAB — AMMONIA: Ammonia: 42 ug/dL (ref 31–169)

## 2024-04-23 LAB — B12 AND FOLATE PANEL
Folate: 13.7 ng/mL (ref >3.0)
Vitamin B-12: 290 pg/mL (ref 232–1245)

## 2024-04-23 NOTE — Telephone Encounter (Signed)
 Can we add any of these labs on to blood from yesterday?

## 2024-04-24 ENCOUNTER — Ambulatory Visit: Payer: Self-pay | Admitting: Physician Assistant

## 2024-04-24 NOTE — Telephone Encounter (Signed)
 Can we call pharmacy. Refills are there?

## 2024-04-24 NOTE — Telephone Encounter (Signed)
 Phosphorous

## 2024-04-24 NOTE — Progress Notes (Signed)
 Helen,   Ammonia normal.  Vitamin b12 is normal range but we like to see it above 400. We could increase to 1000mcg shots monthly instead of 500mcg. Thoughts?

## 2024-04-25 LAB — URINE CULTURE

## 2024-04-27 ENCOUNTER — Ambulatory Visit (INDEPENDENT_AMBULATORY_CARE_PROVIDER_SITE_OTHER)

## 2024-04-27 VITALS — BP 126/78 | HR 67 | Ht 64.0 in | Wt 150.0 lb

## 2024-04-27 DIAGNOSIS — E538 Deficiency of other specified B group vitamins: Secondary | ICD-10-CM

## 2024-04-27 MED ORDER — CYANOCOBALAMIN 1000 MCG/ML IJ SOLN
1000.0000 ug | Freq: Once | INTRAMUSCULAR | Status: AC
  Administered 2024-04-27: 1000 ug via INTRAMUSCULAR

## 2024-04-27 NOTE — Telephone Encounter (Signed)
 Added requested lab orders with DX of vitamin deficiency as ordered by Verm[facility] Antoniette [facility] Patient informed and will stop by the office today for this lab work to be drawn

## 2024-04-27 NOTE — Progress Notes (Signed)
 Patient is in office today for a nurse visit for B12 Injection. Patient Injection was given in the  Right deltoid. Patient tolerated injection well.

## 2024-04-27 NOTE — Progress Notes (Signed)
 No evidence of worrisome bacteria growth.

## 2024-04-29 ENCOUNTER — Other Ambulatory Visit: Payer: Self-pay | Admitting: *Deleted

## 2024-04-29 DIAGNOSIS — R5381 Other malaise: Secondary | ICD-10-CM | POA: Diagnosis not present

## 2024-04-29 DIAGNOSIS — E538 Deficiency of other specified B group vitamins: Secondary | ICD-10-CM

## 2024-04-29 DIAGNOSIS — R5383 Other fatigue: Secondary | ICD-10-CM | POA: Diagnosis not present

## 2024-04-30 LAB — TSH: TSH: 3.35 u[IU]/mL (ref 0.450–4.500)

## 2024-04-30 LAB — IRON: Iron: 104 ug/dL (ref 27–139)

## 2024-04-30 LAB — VITAMIN B12: Vitamin B-12: 839 pg/mL (ref 232–1245)

## 2024-05-01 ENCOUNTER — Ambulatory Visit: Payer: Self-pay | Admitting: Physician Assistant

## 2024-05-01 ENCOUNTER — Encounter: Payer: Self-pay | Admitting: Physician Assistant

## 2024-05-01 NOTE — Progress Notes (Signed)
 B12, iron, thyroid  look great.

## 2024-05-03 ENCOUNTER — Encounter: Payer: Self-pay | Admitting: Physician Assistant

## 2024-05-04 ENCOUNTER — Encounter: Payer: Self-pay | Admitting: Physician Assistant

## 2024-05-06 DIAGNOSIS — M5413 Radiculopathy, cervicothoracic region: Secondary | ICD-10-CM | POA: Diagnosis not present

## 2024-05-06 DIAGNOSIS — M9901 Segmental and somatic dysfunction of cervical region: Secondary | ICD-10-CM | POA: Diagnosis not present

## 2024-05-06 DIAGNOSIS — M5417 Radiculopathy, lumbosacral region: Secondary | ICD-10-CM | POA: Diagnosis not present

## 2024-05-06 DIAGNOSIS — M9904 Segmental and somatic dysfunction of sacral region: Secondary | ICD-10-CM | POA: Diagnosis not present

## 2024-05-06 DIAGNOSIS — M9903 Segmental and somatic dysfunction of lumbar region: Secondary | ICD-10-CM | POA: Diagnosis not present

## 2024-05-06 DIAGNOSIS — M62830 Muscle spasm of back: Secondary | ICD-10-CM | POA: Diagnosis not present

## 2024-05-08 DIAGNOSIS — M5413 Radiculopathy, cervicothoracic region: Secondary | ICD-10-CM | POA: Diagnosis not present

## 2024-05-08 DIAGNOSIS — M62830 Muscle spasm of back: Secondary | ICD-10-CM | POA: Diagnosis not present

## 2024-05-08 DIAGNOSIS — M9901 Segmental and somatic dysfunction of cervical region: Secondary | ICD-10-CM | POA: Diagnosis not present

## 2024-05-08 DIAGNOSIS — M9904 Segmental and somatic dysfunction of sacral region: Secondary | ICD-10-CM | POA: Diagnosis not present

## 2024-05-08 DIAGNOSIS — M5417 Radiculopathy, lumbosacral region: Secondary | ICD-10-CM | POA: Diagnosis not present

## 2024-05-08 DIAGNOSIS — M9903 Segmental and somatic dysfunction of lumbar region: Secondary | ICD-10-CM | POA: Diagnosis not present

## 2024-05-09 LAB — VITAMIN B3
Nicotinamide: 12.8 ng/mL (ref 5.2–72.1)
Nicotinic Acid: <5 ng/mL (ref 0.0–5.0)

## 2024-05-09 LAB — VITAMIN B6: Vitamin B6: 54.5 ug/L (ref 3.4–65.2)

## 2024-05-09 LAB — PHOSPHORUS: Phosphorus: 4.1 mg/dL (ref 3.0–4.3)

## 2024-05-09 LAB — VITAMIN B1: Thiamine: 159.6 nmol/L (ref 66.5–200.0)

## 2024-05-10 ENCOUNTER — Ambulatory Visit: Payer: Self-pay | Admitting: Physician Assistant

## 2024-05-10 NOTE — Progress Notes (Signed)
 B1/B6/phosphorus all look good.

## 2024-05-20 ENCOUNTER — Other Ambulatory Visit: Payer: Self-pay | Admitting: Physician Assistant

## 2024-05-20 DIAGNOSIS — F419 Anxiety disorder, unspecified: Secondary | ICD-10-CM

## 2024-05-21 ENCOUNTER — Encounter: Payer: Self-pay | Admitting: Physician Assistant

## 2024-05-23 ENCOUNTER — Encounter: Payer: Self-pay | Admitting: Physician Assistant

## 2024-05-24 ENCOUNTER — Encounter: Payer: Self-pay | Admitting: Physician Assistant

## 2024-05-25 ENCOUNTER — Encounter: Payer: Self-pay | Admitting: Physician Assistant

## 2024-05-27 ENCOUNTER — Encounter: Payer: Self-pay | Admitting: Physician Assistant

## 2024-05-27 NOTE — Telephone Encounter (Signed)
 Separate encounter states patients caregiver was advised of results today 05/27/2024.

## 2024-05-27 NOTE — Telephone Encounter (Signed)
 Jamie [name] her ex-husband.

## 2024-05-27 NOTE — Telephone Encounter (Signed)
 Called patient to give her the number to MyChart.

## 2024-06-04 ENCOUNTER — Encounter: Payer: Self-pay | Admitting: Physician Assistant

## 2024-06-08 ENCOUNTER — Encounter: Payer: Self-pay | Admitting: Physician Assistant

## 2024-06-13 ENCOUNTER — Encounter: Payer: Self-pay | Admitting: Physician Assistant

## 2024-06-26 ENCOUNTER — Encounter: Admitting: Physician Assistant

## 2024-06-28 ENCOUNTER — Encounter: Payer: Self-pay | Admitting: Physician Assistant

## 2024-06-29 ENCOUNTER — Ambulatory Visit: Payer: Self-pay

## 2024-06-29 ENCOUNTER — Other Ambulatory Visit: Payer: Self-pay

## 2024-06-29 ENCOUNTER — Encounter (HOSPITAL_BASED_OUTPATIENT_CLINIC_OR_DEPARTMENT_OTHER): Payer: Self-pay | Admitting: Urology

## 2024-06-29 ENCOUNTER — Emergency Department (HOSPITAL_BASED_OUTPATIENT_CLINIC_OR_DEPARTMENT_OTHER)
Admission: EM | Admit: 2024-06-29 | Discharge: 2024-06-29 | Disposition: A | Discharge status: Home / Self Care | Attending: Emergency Medicine | Admitting: Emergency Medicine

## 2024-06-29 ENCOUNTER — Emergency Department (HOSPITAL_BASED_OUTPATIENT_CLINIC_OR_DEPARTMENT_OTHER)

## 2024-06-29 DIAGNOSIS — Z79899 Other long term (current) drug therapy: Secondary | ICD-10-CM | POA: Diagnosis not present

## 2024-06-29 DIAGNOSIS — R002 Palpitations: Secondary | ICD-10-CM | POA: Diagnosis not present

## 2024-06-29 DIAGNOSIS — Z91010 Allergy to peanuts: Secondary | ICD-10-CM | POA: Diagnosis not present

## 2024-06-29 DIAGNOSIS — R0789 Other chest pain: Secondary | ICD-10-CM | POA: Diagnosis present

## 2024-06-29 DIAGNOSIS — E875 Hyperkalemia: Secondary | ICD-10-CM | POA: Diagnosis not present

## 2024-06-29 LAB — TROPONIN T, HIGH SENSITIVITY: Troponin T High Sensitivity: <15 ng/L (ref 0–19)

## 2024-06-29 LAB — BASIC METABOLIC PANEL WITH GFR
Anion gap: 11 (ref 5–15)
BUN: 13 mg/dL (ref 8–23)
CO2: 27 mmol/L (ref 22–32)
Calcium: 10.2 mg/dL (ref 8.9–10.3)
Chloride: 105 mmol/L (ref 98–111)
Creatinine, Ser: 0.85 mg/dL (ref 0.44–1.00)
GFR, Estimated: >60 mL/min (ref >60)
Glucose, Bld: 109 mg/dL — ABNORMAL HIGH (ref 70–99)
Potassium: 5.2 mmol/L — ABNORMAL HIGH (ref 3.5–5.1)
Sodium: 143 mmol/L (ref 135–145)

## 2024-06-29 LAB — CBC
HCT: 41.1 % (ref 36.0–46.0)
Hemoglobin: 14.1 g/dL (ref 12.0–15.0)
MCH: 31.3 pg (ref 26.0–34.0)
MCHC: 34.3 g/dL (ref 30.0–36.0)
MCV: 91.3 fL (ref 80.0–100.0)
Platelets: 295 K/uL (ref 150–400)
RBC: 4.5 MIL/uL (ref 3.87–5.11)
RDW: 11.9 % (ref 11.5–15.5)
WBC: 4.4 K/uL (ref 4.0–10.5)
nRBC: 0 % (ref 0.0–0.2)

## 2024-06-29 LAB — D-DIMER, QUANTITATIVE: D-Dimer, Quant: 0.46 ug{FEU}/mL (ref 0.00–0.50)

## 2024-06-29 MED ORDER — SODIUM ZIRCONIUM CYCLOSILICATE 5 G PO PACK
5.0000 g | PACK | ORAL | Status: DC
  Filled 2024-06-29: qty 1

## 2024-06-29 NOTE — Telephone Encounter (Signed)
 Copied from CRM #[id number]. Topic: Clinical - Red Word Triage >> Jun 29, 2024 10:16 AM Leonette SQUIBB wrote: Red Word that prompted transfer to Nurse Triage: Pressure in her chest

## 2024-06-29 NOTE — Telephone Encounter (Signed)
 FYI Only or Action Required?: Action required by provider: update on patient condition.  Patient was last seen in primary care on 04/22/2024 by Antoniette Vermell CROME, PA-C.  Called Nurse Triage reporting Chest Pain.  Symptoms began several days ago.  Triage Disposition: Call EMS 911 Now  Patient/caregiver understands and will follow disposition?: No, wishes to speak with PCP        Copied from CRM #[id number]. Topic: Clinical - Red Word Triage >> Jun 29, 2024 10:18 AM Leonette P wrote: Red Word that prompted transfer to Nurse Triage: pressure in her chest Reason for Disposition  [1] Chest pain lasts > 5 minutes AND [2] age > 63  Answer Assessment - Initial Assessment Questions This RN recommends pt goes to ED via ambulance. Pt refused stating she wants to see Vermell Antoniette, PA. This RN notified [facility] of pt refusal of ED disposition.  Chest pressure for a couple of days Constant 6/10 pressure Location: started on left and now across chest Pt was told she needs a CAT scan O2 levels dropping- for 2-3 days; 74% night before last and woke pt up as her heart was racing Dizzy spells  Protocols used: Chest Pain-A-AH

## 2024-06-29 NOTE — ED Triage Notes (Signed)
 Pt states for 3 days has been feeling off  States started with chest pressure and SOB  States heart beat in head HR at 125  when waking up  Also states some dizzy spells

## 2024-06-29 NOTE — ED Notes (Signed)

## 2024-06-29 NOTE — Discharge Instructions (Signed)
 You were seen for your palpitations in the emergency department.   At home, please refrain from the cayenne pepper.    Follow-up with your primary doctor in 2-3 days regarding your visit.  Follow-up with your cardiologist. Please talk to them to see if you need an outpatient (Holter) monitor.   Return immediately to the emergency department if you experience any of the following: Chest pain, shortness of breath, fainting, or any other concerning symptoms.    Thank you for visiting our Emergency Department. It was a pleasure taking care of you today.

## 2024-06-29 NOTE — ED Provider Notes (Signed)
 [place] [facility] Provider Note   CSN: [id number] Arrival date & time: 06/29/24  1132     Patient presents with: Chest Pain   Jamie [name] is a 73 y.o. female.  {Add pertinent medical, surgical, social history, OB history to HPI:8551} 73 year old female with a history of anxiety who presents emergency department with multiple complaints.  Patient reports that for the past 3 days she has felt off.  Occasionally will have palpitations and a sensation of her heart pounding in her head.  Says that she also has chest pressure that been going on for 3 days.  6/10 severity.  Radiates to her left side.  Not exertional.  No diaphoresis or vomiting.  Says that she has had a dry cough for a month as well.  No fever, runny nose, or sore throat.  Says that her oxygen levels were in the 70s at home and then has been repeatedly taking cayenne pepper which has improved her oxygen saturations.  No history of MI, cancer, DVT, or PE.  No recent surgery.       Prior to Admission medications   Medication Sig Start Date End Date Taking? Authorizing Provider  ALPRAZolam  (XANAX ) 0.5 MG tablet TAKE ONE TABLET BY MOUTH TWICE A DAY AS NEEDED FOR SLEEP 05/22/24   Breeback, [name] L, PA-C  Ascorbic Acid (VITAMIN C) 500 MG CAPS Take 500 mg by mouth 1 day or 1 dose.    [provider]  ASPIRIN  LOW DOSE 81 MG tablet Take 81 mg by mouth daily. 05/18/22   [provider]  b complex vitamins capsule Take 1 capsule by mouth daily.    [provider]  Blood Glucose Monitoring Suppl (CONTOUR NEXT LINK) w/Device KIT Check blood sugar twice daily 06/26/23   Breeback, [name] L, PA-C  Cholecalciferol  (VITAMIN D -3) 125 MCG (5000 UT) TABS Take 5,000 Units/day by mouth.    [provider]  co-enzyme Q-10 30 MG capsule Take 100 mg by mouth 3 (three) times daily.    [provider]  conjugated estrogens  (PREMARIN ) vaginal cream APPLY VAGINALLY 3 TIMES  A WEEK AT NIGHT 02/16/20   Breeback, [name] L, PA-C  EPINEPHrine  0.3 mg/0.3 mL IJ SOAJ injection Inject 0.3 mg into the muscle as needed for anaphylaxis. 04/02/23   Luke Orlan HERO, DO  glucose blood (CONTOUR NEXT TEST) test strip Check blood sugar twice daily 06/26/23   Breeback, [name] L, PA-C  glucose blood (ONETOUCH ULTRA) test strip Check blood sugar twice daily. 06/24/23   Breeback, [name] L, PA-C  hydrochlorothiazide  (HYDRODIURIL ) 12.5 MG tablet Take 1 tablet (12.5 mg total) by mouth daily. 10/08/23   Breeback, [name] L, PA-C  Lancets 30G MISC Check blood sugar twice daily. 01/31/23   Breeback, [name] L, PA-C  linaclotide  (LINZESS ) 290 MCG CAPS capsule Take 1 capsule (290 mcg total) by mouth daily before breakfast. 10/23/23   Breeback, [name] L, PA-C  NON FORMULARY ARMRA COLOSTRUM - IMMUNE REVIVAL    [provider]  OVER THE COUNTER MEDICATION Take 2 capsules by mouth daily. Friendly four    [provider]  pantoprazole  (PROTONIX ) 40 MG tablet Take 1 tablet (40 mg total) by mouth in the morning and at bedtime. As needed 10/17/22   Breeback, [name] L, PA-C  rosuvastatin  (CRESTOR ) 5 MG tablet Take 1 tablet (5 mg total) by mouth daily. 02/26/24   Breeback, [name] L, PA-C    Allergies: Bactrim  [sulfamethoxazole -trimethoprim ], Dairycare [bacid], Ciprofloxacin , Iodine, Iodinated contrast  media, Levaquin  [levofloxacin ], Macrobid  [nitrofurantoin ], Peanut  butter flavoring agent (non-screening), and Shellfish allergy     Review of Systems  Updated Vital Signs BP (!) 142/72 (BP Location: Right Arm)   Pulse 80   Temp 97.9 F (36.6 C) (Oral)   Resp 17   Ht 5' 4 (1.626 m)   Wt 68 kg   SpO2 97%   BMI 25.73 kg/m   Physical Exam Vitals and nursing note reviewed.  Constitutional:      General: She is not in acute distress.    Appearance: She is well-developed.  HENT:     Head: Normocephalic and atraumatic.     Right Ear: External ear normal.     Left Ear: External ear normal.     Nose: Nose normal.   Eyes:     Extraocular Movements: Extraocular movements intact.     Conjunctiva/sclera: Conjunctivae normal.     Pupils: Pupils are equal, round, and reactive to light.  Cardiovascular:     Rate and Rhythm: Normal rate and regular rhythm.     Heart sounds: No murmur heard.    Comments: Radial pulses 2+ bilaterally Pulmonary:     Effort: Pulmonary effort is normal. No respiratory distress.     Breath sounds: Normal breath sounds.  Musculoskeletal:     Cervical back: Normal range of motion and neck supple.     Right lower leg: No edema.     Left lower leg: No edema.  Skin:    General: Skin is warm and dry.  Neurological:     Mental Status: She is alert and oriented to person, place, and time. Mental status is at baseline.  Psychiatric:        Mood and Affect: Mood normal.     (all labs ordered are listed, but only abnormal results are displayed) Labs Reviewed  BASIC METABOLIC PANEL WITH GFR - Abnormal; Notable for the following components:      Result Value   Potassium 5.2 (*)    Glucose, Bld 109 (*)    All other components within normal limits  CBC  TROPONIN T, HIGH SENSITIVITY    EKG: None  Radiology: DG Chest 2 View Result Date: 06/29/2024 CLINICAL DATA:  Chest pain. EXAM: CHEST - 2 VIEW COMPARISON:  05/17/2022. FINDINGS: The heart size and mediastinal contours are within normal limits. Aortic atherosclerosis. No focal consolidation, pleural effusion, or pneumothorax. No acute osseous abnormality. IMPRESSION: 1. No acute cardiopulmonary findings. 2.  Aortic Atherosclerosis (ICD10-I70.0). Electronically Signed   By: Harrietta Sherry M.D.   On: 06/29/2024 13:14    {Document cardiac monitor, telemetry assessment procedure when appropriate:32947} Procedures   Medications Ordered in the ED - No data to display    {Click here for ABCD2, HEART and other calculators REFRESH Note before signing:1}                              Medical Decision Making Amount and/or  Complexity of Data Reviewed Labs: ordered. Radiology: ordered.   ***  {Document critical care time when appropriate  Document review of labs and clinical decision tools ie CHADS2VASC2, etc  Document your independent review of radiology images and any outside records  Document your discussion with family members, caretakers and with consultants  Document social determinants of health affecting pt's care  Document your decision making why or why not admission, treatments were needed:32947:::1}   Final diagnoses:  None    ED Discharge Orders  None

## 2024-06-29 NOTE — Telephone Encounter (Signed)
 If worsening go to ED. Ok to double book for tomorrow.

## 2024-06-29 NOTE — ED Notes (Signed)
 ED Provider at bedside.

## 2024-06-29 NOTE — ED Notes (Signed)
Lab notified of D-dimer add on.

## 2024-06-29 NOTE — Telephone Encounter (Signed)
 Patient scheduled.

## 2024-06-29 NOTE — Telephone Encounter (Signed)
 I'm ok with first thing in the morning to be worked in.

## 2024-06-30 ENCOUNTER — Encounter: Admitting: Physician Assistant

## 2024-06-30 ENCOUNTER — Encounter: Payer: Self-pay | Admitting: Physician Assistant

## 2024-06-30 ENCOUNTER — Ambulatory Visit: Admitting: Physician Assistant

## 2024-06-30 VITALS — BP 118/70 | HR 81 | Temp 98.4°F | Ht 64.8 in | Wt 149.0 lb

## 2024-06-30 DIAGNOSIS — E538 Deficiency of other specified B group vitamins: Secondary | ICD-10-CM | POA: Diagnosis not present

## 2024-06-30 DIAGNOSIS — R6889 Other general symptoms and signs: Secondary | ICD-10-CM | POA: Diagnosis not present

## 2024-06-30 DIAGNOSIS — E782 Mixed hyperlipidemia: Secondary | ICD-10-CM

## 2024-06-30 DIAGNOSIS — Z9189 Other specified personal risk factors, not elsewhere classified: Secondary | ICD-10-CM | POA: Diagnosis not present

## 2024-06-30 DIAGNOSIS — J101 Influenza due to other identified influenza virus with other respiratory manifestations: Secondary | ICD-10-CM

## 2024-06-30 LAB — POC SOFIA 2 FLU + SARS ANTIGEN FIA
Influenza A, POC: NEGATIVE
Influenza B, POC: POSITIVE — AB
SARS Coronavirus 2 Ag: NEGATIVE

## 2024-06-30 MED ORDER — CYANOCOBALAMIN 1000 MCG/ML IJ SOLN: 500.0000 ug | Freq: Once | INTRAMUSCULAR | Status: AC

## 2024-06-30 NOTE — Progress Notes (Unsigned)
 Established Patient Office Visit  Subjective   Patient ID: Jamie [name], female    DOB: 11/05/1950  Age: 73 y.o. MRN: [id number]  Chief Complaint  Patient presents with   Medical Management of Chronic Issues    HPI .SABRADiscussed the use of AI scribe software for clinical note transcription with the patient, who gave verbal consent to proceed.  History of Present Illness Jamie [name] is a 73 year old female who presents with palpitations and chest pressure. She just does not feel right. She is accompanied by her daughter who lives with her.   Palpitations and cardiovascular symptoms - Palpitations and sensation of heart pounding, described as heart 'just goes crazy' even when sitting still - Chest pressure present - No chest pain, shortness of breath, or palpitations during emergency department evaluation on 06/29/2024 - Emergency department evaluation on 06/29/2024: potassium slightly elevated, D-dimer normal, oxygen saturation normal, chest x-ray normal, EKG normal, troponin normal - No evidence of arrhythmia or ischemic changes  Influenza b and systemic symptoms - Tested positive for influenza B after evaluation for COVID-19 and influenza - Generalized malaise with body aches and fatigue - Headaches and blurred vision present - Oxygen saturation drops at night, impacting sleep quality - Taking vitamin C, vitamin D , and zinc  for recovery  Sleep disturbance - Difficulty initiating sleep, typically falling asleep around 5:30 AM and waking at 7:30 AM - Sleep disruption attributed to nocturnal oxygen desaturation  Functional status and social exposure - Recent exposure to a large group at a 'January girl party' on Saturday, which is atypical for her - Concerned about returning to work while still symptomatic - She request scan of her chest to look for blockages.     ROS See HPI.    Objective:     BP 118/70   Pulse 81   Temp 98.4 F (36.9 C) (Oral)   Ht 5' 4.8  (1.646 m)   Wt 149 lb (67.6 kg)   SpO2 98%   BMI 24.95 kg/m  BP Readings from Last 3 Encounters:  06/30/24 118/70  06/29/24 (!) 142/72  04/27/24 126/78   Wt Readings from Last 3 Encounters:  06/30/24 149 lb (67.6 kg)  06/29/24 149 lb 14.6 oz (68 kg)  04/27/24 150 lb (68 kg)    .SABRA Results for orders placed or performed in visit on 06/30/24  POC SOFIA 2 FLU + SARS ANTIGEN FIA   Collection Time: 06/30/24 10:07 AM  Result Value Ref Range   Influenza A, POC Negative Negative   Influenza B, POC Positive (A) Negative   SARS Coronavirus 2 Ag Negative Negative   *Note: Due to a large number of results and/or encounters for the requested time period, some results have not been displayed. A complete set of results can be found in Results Review.     Physical Exam Constitutional:      Appearance: Normal appearance.  HENT:     Head: Normocephalic.     Right Ear: Tympanic membrane, ear canal and external ear normal. There is no impacted cerumen.     Left Ear: Tympanic membrane, ear canal and external ear normal. There is no impacted cerumen.     Nose: Nose normal. No congestion or rhinorrhea.     Mouth/Throat:     Mouth: Mucous membranes are moist.     Pharynx: Posterior oropharyngeal erythema present. No oropharyngeal exudate.  Eyes:     Conjunctiva/sclera: Conjunctivae normal.  Cardiovascular:     Rate and Rhythm:  Normal rate and regular rhythm.  Pulmonary:     Effort: Pulmonary effort is normal.     Breath sounds: Normal breath sounds.     Comments: Chest wall tenderness to palpation over right and left chest wall.  Chest:     Chest wall: Tenderness present.  Musculoskeletal:     Cervical back: Tenderness present.     Right lower leg: No edema.     Left lower leg: No edema.  Lymphadenopathy:     Cervical: Cervical adenopathy present.  Neurological:     General: No focal deficit present.     Mental Status: She is alert and oriented to person, place, and time.   Psychiatric:        Mood and Affect: Mood normal.       The 10-year ASCVD risk score (Arnett DK, et al., 2019) is: 9.2%    Assessment & Plan:  SABRASABRACele was seen today for medical management of chronic issues.  Diagnoses and all orders for this visit:  Influenza B  Flu-like symptoms -     POC SOFIA 2 FLU + SARS ANTIGEN FIA  B12 deficiency -     cyanocobalamin  (VITAMIN B12) injection 500 mcg  At high risk for cardiovascular disease -     CT CARDIAC SCORING (SELF PAY ONLY); Future  Mixed hyperlipidemia -     CT CARDIAC SCORING (SELF PAY ONLY); Future    Assessment & Plan Influenza B infection Confirmed by testing. Antiviral treatment most effective within 24 hours of symptom onset. Discussed antiviral efficacy and typical symptom resolution with supportive care. - Provided symptomatic care handout. - Advised rest and hydration. - Recommended vitamin C, D, and zinc  supplementation.  Palpitations and chest pressure likely secondary to influenza Palpitations and chest pressure likely due to influenza. ED workup normal, ruling out cardiac causes. Symptoms attributed to increased heart rate and blood pressure from flu. - Ordered CT scan with coronary calcium  score of the heart and arteries post-flu recovery.  B12 deficiency - B12 shot given today    Return if symptoms worsen or fail to improve.    Windi Toro, PA-C

## 2024-06-30 NOTE — Patient Instructions (Signed)

## 2024-07-01 ENCOUNTER — Encounter: Payer: Self-pay | Admitting: Physician Assistant

## 2024-07-01 MED ORDER — OSELTAMIVIR PHOSPHATE 75 MG PO CAPS: 75.0000 mg | ORAL_CAPSULE | Freq: Two times a day (BID) | ORAL | 0 refills | Status: DC

## 2024-07-03 ENCOUNTER — Ambulatory Visit: Admitting: Physician Assistant

## 2024-07-03 ENCOUNTER — Inpatient Hospital Stay: Admitting: Physician Assistant

## 2024-07-03 VITALS — BP 138/58 | HR 94 | Ht 64.8 in | Wt 151.0 lb

## 2024-07-03 DIAGNOSIS — J101 Influenza due to other identified influenza virus with other respiratory manifestations: Secondary | ICD-10-CM

## 2024-07-03 DIAGNOSIS — I2583 Coronary atherosclerosis due to lipid rich plaque: Secondary | ICD-10-CM | POA: Diagnosis not present

## 2024-07-03 DIAGNOSIS — I700 Atherosclerosis of aorta: Secondary | ICD-10-CM

## 2024-07-03 DIAGNOSIS — I2510 Atherosclerotic heart disease of native coronary artery without angina pectoris: Secondary | ICD-10-CM | POA: Diagnosis not present

## 2024-07-03 NOTE — Patient Instructions (Signed)
 Follow up with cardiology

## 2024-07-03 NOTE — Progress Notes (Signed)
 Established Patient Office Visit  Subjective   Patient ID: Jamie [name], female    DOB: [birth year]  Age: 73 y.o. MRN: [id number]  Chief Complaint  Patient presents with   Hospitalization Follow-up    HPI Discussed the use of AI scribe software for clinical note transcription with the patient, who gave verbal consent to proceed.  History of Present Illness Patient is a 73 yo female who presents to the clinic to follow up on positive for Influenza B on 11/18 with chest pain.    Chest pain - Pressure sensation localized to the left side of the chest for three days but has resolved.  - Initial pain was significant in intensity, currently resolved.  - No associated fever or dyspnea - Able to sleep better recently, achieving at least five hours of sleep  Coronary artery calcification and atherosclerosis - CT scan one year ago demonstrated a high coronary artery calcium  score - CT scan from November 2023 revealed plaque in the right coronary artery, placing her in the 65th percentile for age group - No current statin therapy; previously treated with atorvastatin  and rosuvastatin   Cardiovascular risk factors and family history - Father had an elevated coronary calcium  score but patent coronary arteries on further evaluation  Barriers to cardiology follow-up - Missed a recent cardiology appointment due to having the flu  Health-related anxiety - Expresses concern and difficulty understanding the significance of her CT scan results    ROS See HPI.    Objective:     BP (!) 138/58   Pulse 94   Ht 5' 4.8 (1.646 m)   Wt 151 lb (68.5 kg)   SpO2 99%   BMI 25.28 kg/m  BP Readings from Last 3 Encounters:  07/03/24 (!) 138/58  06/30/24 118/70  06/29/24 (!) 142/72   Wt Readings from Last 3 Encounters:  07/03/24 151 lb (68.5 kg)  06/30/24 149 lb (67.6 kg)  06/29/24 149 lb 14.6 oz (68 kg)      Physical Exam Constitutional:      Appearance: Normal appearance.  HENT:      Head: Normocephalic.     Right Ear: Tympanic membrane normal.     Left Ear: Tympanic membrane normal.     Nose: Nose normal.     Mouth/Throat:     Mouth: Mucous membranes are moist.     Pharynx: No oropharyngeal exudate or posterior oropharyngeal erythema.  Eyes:     Conjunctiva/sclera: Conjunctivae normal.  Cardiovascular:     Rate and Rhythm: Normal rate and regular rhythm.  Pulmonary:     Effort: Pulmonary effort is normal.     Breath sounds: Normal breath sounds.  Musculoskeletal:     Cervical back: Normal range of motion and neck supple. No tenderness.     Right lower leg: No edema.     Left lower leg: No edema.  Lymphadenopathy:     Cervical: No cervical adenopathy.  Neurological:     General: No focal deficit present.     Mental Status: She is alert and oriented to person, place, and time.  Psychiatric:        Mood and Affect: Mood normal.       The 10-year ASCVD risk score (Arnett DK, et al., 2019) is: 12.3%    Assessment & Plan:  .Jamie was seen today for hospitalization follow-up.  Diagnoses and all orders for this visit:  Influenza B  Coronary artery disease due to lipid rich plaque  Aortic atherosclerosis  Assessment & Plan Influenza B - improving most of her symptoms have resolved - she does not need another flu test and not considered contagious  Atherosclerotic heart disease of native coronary artery and aorta with mixed hyperlipidemia Coronary artery plaque detected in the right coronary artery with a calcium  score of 49, indicating moderate risk. Not on statin therapy, which is recommended to manage plaque accumulation. CT scan shows non-critical plaque accumulation. Exercise stress tests may be considered if chest pain persists. - Initiated rosuvastatin  therapy for cholesterol management. - Will consider exercise stress tests if chest pain persists. - She has appt coming up with cardiology for more evaluation since patient is very concerned  about this risk  Palpitations and chest pressure Intermittent chest pressure, likely related to recent influenza infection. No current breathing difficulties or significant sleep disturbances. - Continue to monitor symptoms and consider cardiology consultation if chest pain persists or worsens.     [name], PA-C

## 2024-07-13 ENCOUNTER — Encounter: Payer: Self-pay | Admitting: Physician Assistant

## 2024-07-13 DIAGNOSIS — I2510 Atherosclerotic heart disease of native coronary artery without angina pectoris: Secondary | ICD-10-CM | POA: Insufficient documentation

## 2024-07-15 ENCOUNTER — Encounter: Payer: Self-pay | Admitting: Physician Assistant

## 2024-07-16 ENCOUNTER — Telehealth: Payer: Self-pay

## 2024-07-16 NOTE — Telephone Encounter (Signed)
 Spoke with the patient to get more information as to why she needs the referral, she states for the past week or so her stool has had a very toxic/ sewer like smell also a little bit of blood. I did advise her she may need to come in to see [name] first, since there is no documentation and she hasn't mentioned to her. She voiced her understanding and would like a call back when we get an update from jade.

## 2024-07-16 NOTE — Telephone Encounter (Signed)
 Copied from CRM #[id number]. Topic: Clinical - Request for Lab/Test Order >> Jul 16, 2024 10:48 AM Avram MATSU wrote: Reason for CRM: patient is requesting an order to be sent out today for Mega Colon abdominal xray, please advise [phone]

## 2024-07-17 NOTE — Telephone Encounter (Signed)
 Sounds like new finding. We can do an xray and stool cultures but need appt. If symptoms worsening or seeing more blood in stool go to ED.

## 2024-07-20 NOTE — Telephone Encounter (Signed)
 Patient is scheduled to come in 07/28/24.

## 2024-07-23 ENCOUNTER — Encounter: Payer: Self-pay | Admitting: Physician Assistant

## 2024-07-24 ENCOUNTER — Other Ambulatory Visit: Payer: Self-pay

## 2024-07-24 ENCOUNTER — Encounter (HOSPITAL_BASED_OUTPATIENT_CLINIC_OR_DEPARTMENT_OTHER): Payer: Self-pay

## 2024-07-24 ENCOUNTER — Encounter: Payer: Self-pay | Admitting: Physician Assistant

## 2024-07-24 ENCOUNTER — Emergency Department (HOSPITAL_BASED_OUTPATIENT_CLINIC_OR_DEPARTMENT_OTHER)

## 2024-07-24 ENCOUNTER — Telehealth: Payer: Self-pay

## 2024-07-24 ENCOUNTER — Emergency Department (HOSPITAL_BASED_OUTPATIENT_CLINIC_OR_DEPARTMENT_OTHER)
Admission: EM | Admit: 2024-07-24 | Discharge: 2024-07-24 | Disposition: A | Discharge status: Home / Self Care | Attending: Emergency Medicine | Admitting: Emergency Medicine

## 2024-07-24 DIAGNOSIS — Z91010 Allergy to peanuts: Secondary | ICD-10-CM | POA: Insufficient documentation

## 2024-07-24 DIAGNOSIS — Z7982 Long term (current) use of aspirin: Secondary | ICD-10-CM | POA: Insufficient documentation

## 2024-07-24 DIAGNOSIS — J069 Acute upper respiratory infection, unspecified: Secondary | ICD-10-CM | POA: Insufficient documentation

## 2024-07-24 LAB — URINALYSIS, ROUTINE W REFLEX MICROSCOPIC
Bilirubin Urine: NEGATIVE
Glucose, UA: NEGATIVE mg/dL
Hgb urine dipstick: NEGATIVE
Ketones, ur: NEGATIVE mg/dL
Leukocytes,Ua: NEGATIVE
Nitrite: NEGATIVE
Protein, ur: NEGATIVE mg/dL
Specific Gravity, Urine: 1.01 (ref 1.005–1.030)
pH: 6 (ref 5.0–8.0)

## 2024-07-24 LAB — RESP PANEL BY RT-PCR (RSV, FLU A&B, COVID)  RVPGX2
Influenza A by PCR: NEGATIVE
Influenza B by PCR: NEGATIVE
Resp Syncytial Virus by PCR: NEGATIVE
SARS Coronavirus 2 by RT PCR: NEGATIVE

## 2024-07-24 MED ORDER — ONDANSETRON 4 MG PO TBDP
4.0000 mg | ORAL_TABLET | Freq: Once | ORAL | Status: AC
  Administered 2024-07-24: 4 mg via ORAL
  Filled 2024-07-24: qty 1

## 2024-07-24 MED ORDER — AZITHROMYCIN 250 MG PO TABS: 250.0000 mg | ORAL_TABLET | Freq: Every day | ORAL | 0 refills | Status: AC

## 2024-07-24 NOTE — Telephone Encounter (Signed)
 See other MyChart message

## 2024-07-24 NOTE — Discharge Instructions (Signed)
 You were seen for your cough and congestion in the emergency department.   At home, please take the Z-Pak in case it is from an atypical bacterial infection.    Check your MyChart online for the results of any tests that had not resulted by the time you left the emergency department.   Follow-up with your primary doctor in 2-3 days regarding your visit.    Return immediately to the emergency department if you experience any of the following: Difficulty breathing, or any other concerning symptoms.    Thank you for visiting our Emergency Department. It was a pleasure taking care of you today.

## 2024-07-24 NOTE — ED Triage Notes (Signed)
 Per daughter, pt has cough, fever, dizziness, blurred vision, headache. Onset of symptoms was 12/10. Pt states she had PET scan of her heart and was injected with radioactive injection in her arm for blocked arteries. Says that she felt fine before that.

## 2024-07-24 NOTE — ED Provider Notes (Signed)
 Jamie [name] Provider Note   CSN: [id number] Arrival date & time: 07/24/24  9145     Patient presents with: Cough   Jamie [name] is a 73 y.o. female.   73 year old female history of anxiety who presents to the emergency department with URI type symptoms for 2 days.  Productive cough.  Congestion, sore throat, subjective fevers and chills.  No objective fevers that been taken at home.  Tested positive for influenza B on 11/18.  Also having some chest pain with coughing but no pain otherwise.  No leg swelling.  Was seen in the emergency department for chest pain on 11/16 and had negative D-dimer and chest x-ray.       Prior to Admission medications  Medication Sig Start Date End Date Taking? Authorizing Provider  ALPRAZolam  (XANAX ) 0.5 MG tablet TAKE ONE TABLET BY MOUTH TWICE A DAY AS NEEDED FOR SLEEP 05/22/24   Breeback, [name] L, PA-C  Ascorbic Acid (VITAMIN C) 500 MG CAPS Take 500 mg by mouth 1 day or 1 dose.    [provider]  ASPIRIN  LOW DOSE 81 MG tablet Take 81 mg by mouth daily. 05/18/22   [provider]  b complex vitamins capsule Take 1 capsule by mouth daily.    [provider]  Blood Glucose Monitoring Suppl (CONTOUR NEXT LINK) w/Device KIT Check blood sugar twice daily 06/26/23   Breeback, [name] L, PA-C  Cholecalciferol  (VITAMIN D -3) 125 MCG (5000 UT) TABS Take 5,000 Units/day by mouth.    [provider]  co-enzyme Q-10 30 MG capsule Take 100 mg by mouth 3 (three) times daily.    [provider]  conjugated estrogens  (PREMARIN ) vaginal cream APPLY VAGINALLY 3 TIMES A WEEK AT NIGHT 02/16/20   Breeback, [name] L, PA-C  EPINEPHrine  0.3 mg/0.3 mL IJ SOAJ injection Inject 0.3 mg into the muscle as needed for anaphylaxis. 04/02/23   Luke Orlan HERO, DO  glucose blood (CONTOUR NEXT TEST) test strip Check blood sugar twice daily 06/26/23   Breeback, [name] L, PA-C  glucose blood (ONETOUCH ULTRA) test  strip Check blood sugar twice daily. 06/24/23   Breeback, [name] L, PA-C  hydrochlorothiazide  (HYDRODIURIL ) 12.5 MG tablet Take 1 tablet (12.5 mg total) by mouth daily. 10/08/23   Breeback, [name] L, PA-C  Lancets 30G MISC Check blood sugar twice daily. 01/31/23   Breeback, [name] L, PA-C  linaclotide  (LINZESS ) 290 MCG CAPS capsule Take 1 capsule (290 mcg total) by mouth daily before breakfast. 10/23/23   Breeback, [name] L, PA-C  NON FORMULARY ARMRA COLOSTRUM - IMMUNE REVIVAL    [provider]  oseltamivir  (TAMIFLU ) 75 MG capsule Take 1 capsule (75 mg total) by mouth 2 (two) times daily. 07/01/24   Breeback, [name] L, PA-C  OVER THE COUNTER MEDICATION Take 2 capsules by mouth daily. Friendly four    [provider]  pantoprazole  (PROTONIX ) 40 MG tablet Take 1 tablet (40 mg total) by mouth in the morning and at bedtime. As needed 10/17/22   Breeback, [name] L, PA-C  rosuvastatin  (CRESTOR ) 5 MG tablet Take 1 tablet (5 mg total) by mouth daily. 02/26/24   Breeback, [name] L, PA-C    Allergies: Bactrim  [sulfamethoxazole -trimethoprim ], Dairycare [bacid], Ciprofloxacin , Iodine, Iodinated contrast media, Levaquin  [levofloxacin ], Macrobid  [nitrofurantoin ], Peanut  butter flavoring agent (non-screening), and Shellfish allergy     Review of Systems  Updated Vital Signs BP 121/74   Pulse 90   Temp 99.7 F (37.6 C) (Oral)   Resp  18   Ht 5' 4 (1.626 m)   Wt 63.5 kg   SpO2 99%   BMI 24.03 kg/m   Physical Exam Vitals and nursing note reviewed.  Constitutional:      General: She is not in acute distress.    Appearance: She is well-developed.  HENT:     Head: Normocephalic and atraumatic.     Right Ear: Tympanic membrane, ear canal and external ear normal.     Left Ear: Tympanic membrane, ear canal and external ear normal.     Nose: Congestion present.     Mouth/Throat:     Pharynx: Posterior oropharyngeal erythema present.  Eyes:     Extraocular Movements: Extraocular movements intact.      Conjunctiva/sclera: Conjunctivae normal.     Pupils: Pupils are equal, round, and reactive to light.  Cardiovascular:     Rate and Rhythm: Normal rate and regular rhythm.     Heart sounds: No murmur heard. Pulmonary:     Effort: Pulmonary effort is normal. No respiratory distress.     Breath sounds: Normal breath sounds.  Musculoskeletal:     Cervical back: Normal range of motion and neck supple.     Right lower leg: No edema.     Left lower leg: No edema.  Skin:    General: Skin is warm and dry.  Neurological:     Mental Status: She is alert and oriented to person, place, and time. Mental status is at baseline.  Psychiatric:        Mood and Affect: Mood normal.     (all labs ordered are listed, but only abnormal results are displayed) Labs Reviewed  RESP PANEL BY RT-PCR (RSV, FLU A&B, COVID)  RVPGX2  URINALYSIS, ROUTINE W REFLEX MICROSCOPIC    EKG: EKG Interpretation Date/Time:  Friday July 24 2024 09:09:53 EST Ventricular Rate:  98 PR Interval:  123 QRS Duration:  95 QT Interval:  348 QTC Calculation: 445 R Axis:   28  Text Interpretation: Sinus rhythm Low voltage, precordial leads Abnormal inferior Q waves Confirmed by Yolande Charleston [phone]) on 07/24/2024 9:24:57 AM  Radiology: [facility] Chest 2 View Result Date: 07/24/2024 CLINICAL DATA:  Cough. EXAM: CHEST - 2 VIEW COMPARISON:  06/29/2024. FINDINGS: The heart size and mediastinal contours are within normal limits. Aortic atherosclerosis. No overt pulmonary edema, focal consolidation, pleural effusion, or pneumothorax. No acute osseous abnormality. IMPRESSION: No acute cardiopulmonary findings. Electronically Signed   By: Harrietta Sherry M.D.   On: 07/24/2024 11:33     Procedures   Medications Ordered in the ED  ondansetron  (ZOFRAN -ODT) disintegrating tablet 4 mg (4 mg Oral Given 07/24/24 0957)                                    Medical Decision Making Amount and/or Complexity of Data Reviewed Labs:  ordered. Radiology: ordered.  Risk Prescription drug management.   Jamie [name] is a 73 year old female history of anxiety presents emergency department URI type symptoms  Initial Ddx:  URI, pneumonia, sinusitis, allergic reaction  MDM/Course:  Patient presents emergency department with URI type symptoms.  Did have the flu approximately a month ago.  She thinks it could potentially be related to the Cardiolite contrast that she received after a stress test 2 days ago.  On exam is not in acute distress.  No signs of an allergic reaction.  Do not have any abnormal lung  sounds.  She is afebrile and satting well on room air.  Chest x-ray without pneumonia.  COVID and flu negative.  I suspect that she may have another URI but is quite concerned and would like antibiotics.  Will give her azithromycin  to cover atypical infections in case that is what is causing her symptoms or sinusitis.  Will have her follow-up with her primary doctor in several days  This patient presents to the ED for concern of complaints listed in HPI, this involves an extensive number of treatment options, and is a complaint that carries with it a high risk of complications and morbidity. Disposition including potential need for admission considered.   Dispo: DC Home. Return precautions discussed including, but not limited to, those listed in the AVS. Allowed pt time to ask questions which were answered fully prior to dc.  Additional history obtained from daughter Records reviewed Outpatient Clinic Notes I independently reviewed the following imaging with scope of interpretation limited to determining acute life threatening conditions related to emergency care: Chest x-ray and agree with the radiologist interpretation with the following exceptions: none I have reviewed the patients home medications and made adjustments as needed Social Determinants of health:  Geriatric  Portions of this note were generated with Administrator, sports. Dictation errors may occur despite best attempts at proofreading.     Final diagnoses:  Viral URI with cough    ED Discharge Orders     None          Yolande Lamar BROCKS, MD 07/24/24 1154

## 2024-07-24 NOTE — Telephone Encounter (Signed)
 Copied from CRM #[id number]. Topic: Clinical - Request for Lab/Test Order >> Jul 23, 2024  3:31 PM Isabell A wrote: Reason for CRM: Patient would like to know if Dr.Olalere would be able to order a test for the right ventricle of the heart.   Callback number: [phone]   I do not see where pt has a cardiologist. Please advise Dr. Neda

## 2024-07-27 ENCOUNTER — Encounter: Payer: Self-pay | Admitting: Physician Assistant

## 2024-07-28 ENCOUNTER — Encounter: Payer: Self-pay | Admitting: Physician Assistant

## 2024-07-28 ENCOUNTER — Ambulatory Visit: Admitting: Physician Assistant

## 2024-07-28 VITALS — BP 126/62 | HR 56 | Ht 64.0 in | Wt 140.0 lb

## 2024-07-28 DIAGNOSIS — E538 Deficiency of other specified B group vitamins: Secondary | ICD-10-CM

## 2024-07-28 DIAGNOSIS — E782 Mixed hyperlipidemia: Secondary | ICD-10-CM

## 2024-07-28 DIAGNOSIS — I2583 Coronary atherosclerosis due to lipid rich plaque: Secondary | ICD-10-CM | POA: Diagnosis not present

## 2024-07-28 DIAGNOSIS — E875 Hyperkalemia: Secondary | ICD-10-CM | POA: Diagnosis not present

## 2024-07-28 DIAGNOSIS — M79604 Pain in right leg: Secondary | ICD-10-CM

## 2024-07-28 DIAGNOSIS — E569 Vitamin deficiency, unspecified: Secondary | ICD-10-CM

## 2024-07-28 DIAGNOSIS — J40 Bronchitis, not specified as acute or chronic: Secondary | ICD-10-CM

## 2024-07-28 DIAGNOSIS — M79605 Pain in left leg: Secondary | ICD-10-CM | POA: Diagnosis not present

## 2024-07-28 DIAGNOSIS — J329 Chronic sinusitis, unspecified: Secondary | ICD-10-CM | POA: Diagnosis not present

## 2024-07-28 DIAGNOSIS — I2510 Atherosclerotic heart disease of native coronary artery without angina pectoris: Secondary | ICD-10-CM | POA: Diagnosis not present

## 2024-07-28 MED ORDER — PROMETHAZINE-DM 6.25-15 MG/5ML PO SYRP: 5.0000 mL | ORAL_SOLUTION | Freq: Four times a day (QID) | ORAL | 0 refills | Status: AC | PRN

## 2024-07-28 MED ORDER — METHYLPREDNISOLONE 4 MG PO TBPK: ORAL_TABLET | ORAL | 0 refills | Status: AC

## 2024-07-28 NOTE — Patient Instructions (Signed)
 Cough syrup sent to pharmacy.  Start medrol  dose pack.   Cough, Adult Coughing is a reflex that clears your throat and airways (respiratory system). It helps heal and protect your lungs. It is normal to cough from time to time. A cough that happens with other symptoms or that lasts a long time may be a sign of a condition that needs treatment. A short-term (acute) cough may only last 2-3 weeks. A long-term (chronic) cough may last 8 or more weeks. Coughing is often caused by: Diseases, such as: An infection of the respiratory system. Asthma or other heart or lung diseases. Gastroesophageal reflux. This is when acid comes back up from the stomach. Breathing in things that irritate your lungs. Allergies. Postnasal drip. This is when mucus runs down the back of your throat. Smoking. Some medicines. Follow these instructions at home: Medicines Take over-the-counter and prescription medicines only as told by your health care provider. Talk with your provider before you take cough medicine (cough suppressants). Eating and drinking Do not drink alcohol. Avoid caffeine . Drink enough fluid to keep your pee (urine) pale yellow. Lifestyle Avoid cigarette smoke. Do not use any products that contain nicotine or tobacco. These products include cigarettes, chewing tobacco, and vaping devices, such as e-cigarettes. If you need help quitting, ask your provider. Avoid things that make you cough. These may include perfumes, candles, cleaning products, or campfire smoke. General instructions  Watch for any changes to your cough. Tell your provider about them. Always cover your mouth when you cough. If the air is dry in your bedroom or home, use a cool mist vaporizer or humidifier. If your cough is worse at night, try to sleep in a semi-upright position. Rest as needed. Contact a health care provider if: You have new symptoms, or your symptoms get worse. You cough up pus. You have a fever that does  not go away or a cough that does not get better after 2-3 weeks. You cannot control your cough with medicine, and you are losing sleep. You have pain that gets worse or is not helped with medicine. You lose weight for no clear reason. You have night sweats. Get help right away if: You cough up blood. You have trouble breathing. Your heart is beating very fast. These symptoms may be an emergency. Get help right away. Call 911. Do not wait to see if the symptoms will go away. Do not drive yourself to the hospital. This information is not intended to replace advice given to you by your health care provider. Make sure you discuss any questions you have with your health care provider. Document Revised: 03/30/2022 Document Reviewed: 03/30/2022 Elsevier Patient Education  2024 [employer].

## 2024-07-29 ENCOUNTER — Encounter: Payer: Self-pay | Admitting: Physician Assistant

## 2024-07-29 ENCOUNTER — Other Ambulatory Visit: Payer: Self-pay | Admitting: Pulmonary Disease

## 2024-07-29 DIAGNOSIS — R0609 Other forms of dyspnea: Secondary | ICD-10-CM

## 2024-07-30 ENCOUNTER — Encounter: Payer: Self-pay | Admitting: Physician Assistant

## 2024-07-30 ENCOUNTER — Telehealth: Payer: Self-pay

## 2024-07-30 ENCOUNTER — Ambulatory Visit: Payer: Self-pay

## 2024-07-30 ENCOUNTER — Ambulatory Visit: Payer: Self-pay | Admitting: Physician Assistant

## 2024-07-30 LAB — B12 AND FOLATE PANEL
Folate: >20 ng/mL (ref >3.0)
Vitamin B-12: 340 pg/mL (ref 232–1245)

## 2024-07-30 LAB — CMP14+EGFR
ALT: 16 IU/L (ref 0–32)
AST: 25 IU/L (ref 0–40)
Albumin: 4.4 g/dL (ref 3.8–4.8)
Alkaline Phosphatase: 65 IU/L (ref 49–135)
BUN/Creatinine Ratio: 23 (ref 12–28)
BUN: 18 mg/dL (ref 8–27)
Bilirubin Total: 0.5 mg/dL (ref 0.0–1.2)
CO2: 24 mmol/L (ref 20–29)
Calcium: 9.3 mg/dL (ref 8.7–10.3)
Chloride: 102 mmol/L (ref 96–106)
Creatinine, Ser: 0.8 mg/dL (ref 0.57–1.00)
Globulin, Total: 2.2 g/dL (ref 1.5–4.5)
Glucose: 87 mg/dL (ref 70–99)
Potassium: 4.4 mmol/L (ref 3.5–5.2)
Sodium: 139 mmol/L (ref 134–144)
Total Protein: 6.6 g/dL (ref 6.0–8.5)
eGFR: 79 mL/min/1.73 (ref >59)

## 2024-07-30 LAB — CBC WITH DIFFERENTIAL/PLATELET
Basophils Absolute: 0 x10E3/uL (ref 0.0–0.2)
Basos: 1 %
EOS (ABSOLUTE): 0 x10E3/uL (ref 0.0–0.4)
Eos: 1 %
Hematocrit: 39.5 % (ref 34.0–46.6)
Hemoglobin: 13.3 g/dL (ref 11.1–15.9)
Immature Grans (Abs): 0 x10E3/uL (ref 0.0–0.1)
Immature Granulocytes: 0 %
Lymphocytes Absolute: 1.2 x10E3/uL (ref 0.7–3.1)
Lymphs: 36 %
MCH: 31.6 pg (ref 26.6–33.0)
MCHC: 33.7 g/dL (ref 31.5–35.7)
MCV: 94 fL (ref 79–97)
Monocytes Absolute: 0.4 x10E3/uL (ref 0.1–0.9)
Monocytes: 12 %
Neutrophils Absolute: 1.7 x10E3/uL (ref 1.4–7.0)
Neutrophils: 50 %
Platelets: 254 x10E3/uL (ref 150–450)
RBC: 4.21 x10E6/uL (ref 3.77–5.28)
RDW: 11.8 % (ref 11.7–15.4)
WBC: 3.3 x10E3/uL — ABNORMAL LOW (ref 3.4–10.8)

## 2024-07-30 LAB — LIPID PANEL
Chol/HDL Ratio: 3 ratio (ref 0.0–4.4)
Cholesterol, Total: 203 mg/dL — ABNORMAL HIGH (ref 100–199)
HDL: 68 mg/dL (ref >39)
LDL Chol Calc (NIH): 119 mg/dL — ABNORMAL HIGH (ref 0–99)
Triglycerides: 91 mg/dL (ref 0–149)
VLDL Cholesterol Cal: 16 mg/dL (ref 5–40)

## 2024-07-30 LAB — IRON,TIBC AND FERRITIN PANEL
Ferritin: 58 ng/mL (ref 15–150)
Iron Saturation: 19 % (ref 15–55)
Iron: 65 ug/dL (ref 27–139)
Total Iron Binding Capacity: 336 ug/dL (ref 250–450)
UIBC: 271 ug/dL (ref 118–369)

## 2024-07-30 LAB — VITAMIN D 25 HYDROXY (VIT D DEFICIENCY, FRACTURES): Vit D, 25-Hydroxy: 101 ng/mL — ABNORMAL HIGH (ref 30.0–100.0)

## 2024-07-30 NOTE — Progress Notes (Signed)
 Jamie [name],  WBC are a little low. Just a little and we do see this while fighting infection. You are on a antibiotic. You can start vitamin C 1000mg  and zinc  50mg  to help with immune system. We can recheck WBC in 2 weeks and watch it normalize.   Iron stores are good.   Kidney and liver look good.   B12 low normal. Need to continue with b12 shots in office and make sure to come more regularly like every 3 weeks since you are only taking 500mcg 1/2 the normal dosing.   Cholesterol is MUCH better.   Vitamin D  is too high. Need to cut your vitamin D  supplementation in half.

## 2024-07-30 NOTE — Telephone Encounter (Signed)
 This has already been addressed by provider      Copied from CRM #[id number]. Topic: Clinical - Request for Lab/Test Order >> Jul 23, 2024  3:31 PM Jamie [name] wrote: Reason for CRM: Patient would like to know if Dr.Olalere would be able to order [name] test for the right ventricle of the heart.   Callback number: [phone] >> Jul 29, 2024  1:47 PM Russell PARAS wrote: Pt is contacting clinic to get status update on request for order of echocardiogram from [facility]. Reviewed chart and advised she was scheduled for echo and provided date/time.  Wants to advised Olalere she had [name] previous echocardiogram performed on 12/10 at [facility]), and they were only able to get  pictures of left side of heart. This is why she requested an additional echocardiogram through [facility] to get [name] complete picture of the entire heart  CB#  [phone]

## 2024-07-30 NOTE — Telephone Encounter (Signed)
 Pt calling as she would like PCP to advise her what to do to raise her white blood cells, pt would like PCP to look at her lab results prior to contacting her. Pt would like communication back from PCP. Pt declined appt at this time I was just there yesterday.   Copied from CRM [id number]. Topic: Clinical - Red Word Triage >> Jul 30, 2024 12:19 PM Wess RAMAN wrote: Red Word that prompted transfer to Nurse Triage: Sick for a month. No white blood cells to fight.   Fever, infection in lungs

## 2024-07-31 ENCOUNTER — Encounter: Payer: Self-pay | Admitting: Physician Assistant

## 2024-07-31 DIAGNOSIS — E569 Vitamin deficiency, unspecified: Secondary | ICD-10-CM | POA: Insufficient documentation

## 2024-07-31 DIAGNOSIS — M79604 Pain in right leg: Secondary | ICD-10-CM | POA: Insufficient documentation

## 2024-07-31 NOTE — Progress Notes (Signed)
 "  Established Patient Office Visit  Subjective   Patient ID: Jamie [name], female    DOB: 1950-08-25  Age: 73 y.o. MRN: [id number]   HPI Pt is a 73 yo female who presents to the clinic to follow up after flu that resulted in URI symptoms. She was last seen by UC on 12/12 for URI.  Tested negative for covid/strep/flu. CXR normal. Start zpak to complete with augmentin . Pt is feeling some better. She is very tired and feels completely wiped out. She is worried about how she feels. She denies any wheezing but she does feel more easily winded. She wants her vitamins checked.   She is concerned with bilateral leg pain with exertion. She wants to make sure no plaque in legs. She admits she is not taking statin. She has CAD and elevated LDL. She is trying to eat better.     ROS See HPI.    Objective:     BP 124/62   Pulse (!) 56   Ht 5' 4 (1.626 m)   Wt 140 lb (63.5 kg)   SpO2 99%   BMI 24.03 kg/m  BP Readings from Last 3 Encounters:  07/28/24 124/62  07/24/24 121/74  07/03/24 (!) 138/58   Wt Readings from Last 3 Encounters:  07/28/24 140 lb (63.5 kg)  07/24/24 140 lb (63.5 kg)  07/03/24 151 lb (68.5 kg)      Physical Exam Constitutional:      Appearance: Normal appearance.  HENT:     Head: Normocephalic.  Cardiovascular:     Rate and Rhythm: Regular rhythm. Bradycardia present.  Pulmonary:     Effort: Pulmonary effort is normal.     Breath sounds: Normal breath sounds.  Musculoskeletal:     Right lower leg: No edema.     Left lower leg: No edema.  Neurological:     General: No focal deficit present.     Mental Status: She is alert and oriented to person, place, and time.  Psychiatric:        Mood and Affect: Mood normal.      The 10-year ASCVD risk score (Arnett DK, et al., 2019) is: 9.7%    Assessment & Plan:  SABRASABRABritanee was seen today for stool color change.  Diagnoses and all orders for this visit:  Sinobronchitis -     CBC w/Diff/Platelet -      Fe+TIBC+Fer -     promethazine -dextromethorphan (PROMETHAZINE -DM) 6.25-15 MG/5ML syrup; Take 5 mLs by mouth 4 (four) times daily as needed. -     methylPREDNISolone  (MEDROL  DOSEPAK) 4 MG TBPK tablet; Take as directed by package insert.  B12 deficiency -     B12 and Folate Panel -     CMP14+EGFR -     Fe+TIBC+Fer  Vitamin deficiency -     Fe+TIBC+Fer -     VITAMIN D  25 Hydroxy (Vit-D Deficiency, Fractures)  Mixed hyperlipidemia -     Lipid panel -     CMP14+EGFR  Hyperkalemia -     CMP14+EGFR  Bilateral leg pain -     VAS US  ABI WITH/WO TBI; Future   Pt not concerned right now with stool color change. She wants to get over her URI.  She seems like she is improving on exam today and discussed can take quite a few days to fully recoop Cough syrup given to take Medrol  dose could also help with inflammation Labs to recheck vitamins ABI ordered due to bilateral leg pain with exertion and  her elevated LDL history with CAD.  Strongly encouraged patient to start back on statin therapy   Return if symptoms worsen or fail to improve.    Calel Pisarski, PA-C  "

## 2024-08-02 ENCOUNTER — Encounter: Payer: Self-pay | Admitting: Physician Assistant

## 2024-08-03 ENCOUNTER — Ambulatory Visit

## 2024-08-04 ENCOUNTER — Other Ambulatory Visit: Payer: Self-pay | Admitting: Physician Assistant

## 2024-08-04 ENCOUNTER — Ambulatory Visit

## 2024-08-04 NOTE — Progress Notes (Deleted)
" ° °  Subjective:    Patient ID: Jamie [name], female    DOB: 08-04-1951, 73 y.o.   MRN: [id number]  HPI  Patient is here for her B12 injection. Patient comes every 21 days. Denies muscle cramps, weakness, or irregular heart rate.  Review of Systems     Objective:   Physical Exam        Assessment & Plan:   Patient given injection in her LD RD. Pt advised to RTC in 21 days for next injection "

## 2024-08-07 ENCOUNTER — Ambulatory Visit

## 2024-08-07 ENCOUNTER — Other Ambulatory Visit: Payer: Self-pay | Admitting: Physician Assistant

## 2024-08-07 DIAGNOSIS — E538 Deficiency of other specified B group vitamins: Secondary | ICD-10-CM

## 2024-08-08 LAB — VITAMIN D 25 HYDROXY (VIT D DEFICIENCY, FRACTURES): Vit D, 25-Hydroxy: 79.4 ng/mL (ref 30.0–100.0)

## 2024-08-08 LAB — CBC WITH DIFFERENTIAL/PLATELET
Basophils Absolute: 0.1 x10E3/uL (ref 0.0–0.2)
Basos: 1 %
EOS (ABSOLUTE): 0.1 x10E3/uL (ref 0.0–0.4)
Eos: 1 %
Hematocrit: 38.5 % (ref 34.0–46.6)
Hemoglobin: 12.9 g/dL (ref 11.1–15.9)
Immature Grans (Abs): 0.1 x10E3/uL (ref 0.0–0.1)
Immature Granulocytes: 1 %
Lymphocytes Absolute: 1.9 x10E3/uL (ref 0.7–3.1)
Lymphs: 28 %
MCH: 31.7 pg (ref 26.6–33.0)
MCHC: 33.5 g/dL (ref 31.5–35.7)
MCV: 95 fL (ref 79–97)
Monocytes Absolute: 0.8 x10E3/uL (ref 0.1–0.9)
Monocytes: 11 %
Neutrophils Absolute: 3.9 x10E3/uL (ref 1.4–7.0)
Neutrophils: 58 %
Platelets: 346 x10E3/uL (ref 150–450)
RBC: 4.07 x10E6/uL (ref 3.77–5.28)
RDW: 11.9 % (ref 11.7–15.4)
WBC: 6.8 x10E3/uL (ref 3.4–10.8)

## 2024-08-10 ENCOUNTER — Ambulatory Visit: Payer: Self-pay | Admitting: Physician Assistant

## 2024-08-10 NOTE — Progress Notes (Signed)
 Jamie [name],   Your white blood count looks much better! Your hemoglobin looks good too.  Vitamin D  looks good.

## 2024-08-18 ENCOUNTER — Encounter: Payer: Self-pay | Admitting: Physician Assistant

## 2024-08-18 MED ORDER — AIRSUPRA 90-80 MCG/ACT IN AERO: 2.0000 | INHALATION_SPRAY | Freq: Four times a day (QID) | RESPIRATORY_TRACT | 1 refills | Status: AC | PRN

## 2024-08-19 ENCOUNTER — Ambulatory Visit (HOSPITAL_BASED_OUTPATIENT_CLINIC_OR_DEPARTMENT_OTHER)
Admission: RE | Admit: 2024-08-19 | Discharge: 2024-08-19 | Disposition: A | Discharge status: Home / Self Care | Source: Ambulatory Visit | Attending: Physician Assistant | Admitting: Physician Assistant

## 2024-08-19 DIAGNOSIS — M79604 Pain in right leg: Secondary | ICD-10-CM | POA: Diagnosis not present

## 2024-08-19 DIAGNOSIS — M79605 Pain in left leg: Secondary | ICD-10-CM

## 2024-08-19 LAB — VAS US ABI WITH/WO TBI
Left ABI: 1.14
Right ABI: 1.08

## 2024-08-19 NOTE — Progress Notes (Signed)
 Jamie [name],   Your circulation looks great! Doppler study normal.

## 2024-08-24 ENCOUNTER — Ambulatory Visit: Admitting: Physician Assistant

## 2024-08-24 ENCOUNTER — Other Ambulatory Visit: Payer: Self-pay | Admitting: Physician Assistant

## 2024-08-24 ENCOUNTER — Encounter: Payer: Self-pay | Admitting: Physician Assistant

## 2024-08-24 DIAGNOSIS — E538 Deficiency of other specified B group vitamins: Secondary | ICD-10-CM

## 2024-08-24 DIAGNOSIS — F419 Anxiety disorder, unspecified: Secondary | ICD-10-CM

## 2024-08-24 MED ORDER — ALPRAZOLAM 0.5 MG PO TABS: ORAL_TABLET | ORAL | 2 refills | Status: AC

## 2024-08-24 MED ORDER — CYANOCOBALAMIN 1000 MCG/ML IJ SOLN
1000.0000 ug | Freq: Once | INTRAMUSCULAR | Status: AC
  Administered 2024-08-24: 1000 ug via INTRAMUSCULAR

## 2024-08-24 NOTE — Progress Notes (Signed)
 B12 monthy injection done today.   Jamie [name] was seen today for b12 injection.  Diagnoses and all orders for this visit:  B12 deficiency -     cyanocobalamin  (VITAMIN B12) injection 1,000 mcg

## 2024-08-24 NOTE — Telephone Encounter (Signed)
 Requesting rx rf of Alprazolam  0.5mg  Last written 05/22/2024 Last OV today 08/24/2024 Upcoming appt = none

## 2024-08-24 NOTE — Telephone Encounter (Signed)
 Copied from CRM #[id number]. Topic: Clinical - Medication Refill >> Aug 24, 2024  3:30 PM Kevelyn M wrote: Medication: Pending ALPRAZolam   Has the patient contacted their pharmacy?Saw provider today (Agent: If no, request that the patient contact the pharmacy for the refill. If patient does not wish to contact the pharmacy document the reason why and proceed with request.) (Agent: If yes, when and what did the pharmacy advise?)  This is the patient's preferred pharmacy:  [facility] [address] - [place], [place] - [address], [address] [place] [address] Phone: [phone] Fax: [phone]  Is this the correct pharmacy for this prescription? Yes If no, delete pharmacy and type the correct one.   Has the prescription been filled recently? No  Is the patient out of the medication? Yes  Has the patient been seen for an appointment in the last year OR does the patient have an upcoming appointment? Yes  Can we respond through MyChart? Yes  Agent: Please be advised that Rx refills may take up to 3 business days. We ask that you follow-up with your pharmacy.

## 2024-08-25 ENCOUNTER — Encounter: Payer: Self-pay | Admitting: Physician Assistant

## 2024-08-31 ENCOUNTER — Encounter: Payer: Self-pay | Admitting: Physician Assistant

## 2024-09-03 ENCOUNTER — Ambulatory Visit: Payer: Self-pay

## 2024-09-03 ENCOUNTER — Ambulatory Visit: Admitting: Urgent Care

## 2024-09-03 VITALS — BP 115/72 | HR 94 | Temp 98.5°F | Ht 64.0 in | Wt 149.0 lb

## 2024-09-03 DIAGNOSIS — B0229 Other postherpetic nervous system involvement: Secondary | ICD-10-CM

## 2024-09-03 DIAGNOSIS — R0981 Nasal congestion: Secondary | ICD-10-CM | POA: Diagnosis not present

## 2024-09-03 DIAGNOSIS — R6889 Other general symptoms and signs: Secondary | ICD-10-CM | POA: Diagnosis not present

## 2024-09-03 MED ORDER — LIDOCAINE 5 % EX PTCH: 1.0000 | MEDICATED_PATCH | CUTANEOUS | 0 refills | Status: AC

## 2024-09-03 MED ORDER — VALACYCLOVIR HCL 1 G PO TABS: 1000.0000 mg | ORAL_TABLET | Freq: Three times a day (TID) | ORAL | 0 refills | Status: AC

## 2024-09-03 NOTE — Progress Notes (Unsigned)
" ° °  Established Patient Office Visit  Subjective:  Patient ID: Jamie [name], female    DOB: [birth year]  Age: 74 y.o. MRN: [id number]  Chief Complaint  Patient presents with   Cough    HPI  {History (Optional):23778}  ROS: as noted in HPI  Objective:     BP 115/72   Pulse 94   Temp 98.5 F (36.9 C) (Oral)   Ht 5' 4 (1.626 m)   Wt 149 lb (67.6 kg)   SpO2 99%   BMI 25.58 kg/m  BP Readings from Last 3 Encounters:  09/03/24 115/72  07/28/24 124/62  07/24/24 121/74   Wt Readings from Last 3 Encounters:  09/03/24 149 lb (67.6 kg)  07/28/24 140 lb (63.5 kg)  07/24/24 140 lb (63.5 kg)      Physical Exam   No results found for any visits on 09/03/24.    The 10-year ASCVD risk score (Arnett DK, et al., 2019) is: 8.4%  Assessment & Plan:  Flu-like symptoms -     POC SOFIA 2 FLU + SARS ANTIGEN FIA     No follow-ups on file.   Jamie LITTIE Gave, PA "

## 2024-09-03 NOTE — Patient Instructions (Signed)
 Your covid and flu tests are normal.  Please pick up the following supplements to help with your respiratory symptoms: - quercetin with zinc  - helps boost immunity - oscillococcinum - helps with fatigue and body aches  Please apply the topical lidoderm  patch to the left side of your back to help with neuralgia. You can also take the valtrex  as needed for a flare.

## 2024-09-03 NOTE — Telephone Encounter (Signed)
 FYI Only or Action Required?: Action required by provider: request for appointment.  Patient was last seen in primary care on 08/24/2024 by Antoniette Vermell CROME, PA-C.  Called Nurse Triage reporting Influenza.  Symptoms began several days ago.  Interventions attempted: OTC medications:   and Rest, hydration, or home remedies.  Symptoms are: unchanged.  Triage Disposition: Call PCP Within 24 Hours  Patient/caregiver understands and will follow disposition?:   Message from Berwyn MATSU sent at 09/03/2024  9:04 AM EST  Reason for Triage: COVID-19 and flu symptoms patient states she needs to be seen she's really bad.   Reason for Disposition  Patient is HIGH RISK (e.g., age > 64 years, pregnant, HIV+, or chronic medical condition)  Answer Assessment - Initial Assessment Questions Pt takes a lot of OTC vitamins and hasn't taken anything OTC for sx. States she wants to come in for nurse visit since [place], PCP off today. Advised I wouldn't able to schedule for nurse visit. Pt addiment she wanted to speak with PCP nurse, called [facility], was going to send message back for CB to pt.    1. WORST SYMPTOM: What is your worst symptom? (e.g., cough, runny nose, muscle aches, headache, sore throat, fever)      SOB and cough  2. ONSET: When did your flu symptoms start?      2 days ago, went to baby shower Saturday 3. COUGH: How bad is the cough?       mild 4. RESPIRATORY DISTRESS: Describe your breathing.      SOB at times 5. FEVER: Do you have a fever? If Yes, ask: What is your temperature, how was it measured, and when did it start?     no 6. EXPOSURE: Were you exposed to someone with influenza?       unsure 8. HIGH RISK DISEASE: Do you have any chronic medical problems? (e.g., heart or lung disease, asthma, weak immune system, or other HIGH RISK conditions)     yes 10. OTHER SYMPTOMS: Do you have any other symptoms?  (e.g., runny nose, muscle aches, headache, sore throat)       Nasal  congestion  Protocols used: Influenza (Flu) - [facility]

## 2024-09-04 ENCOUNTER — Encounter: Payer: Self-pay | Admitting: Urgent Care

## 2024-09-04 LAB — POC SOFIA 2 FLU + SARS ANTIGEN FIA
Influenza A, POC: NEGATIVE
Influenza B, POC: NEGATIVE
SARS Coronavirus 2 Ag: NEGATIVE

## 2024-09-08 ENCOUNTER — Ambulatory Visit (HOSPITAL_COMMUNITY)

## 2024-10-01 ENCOUNTER — Ambulatory Visit (HOSPITAL_COMMUNITY)
